# Patient Record
Sex: Female | Born: 1940 | Race: White | Hispanic: No | Marital: Married | Smoking: Former smoker
Health system: Southern US, Community
[De-identification: ages and names within clinical notes are randomized; demographics above are authoritative.]

## PROBLEM LIST (undated history)

## (undated) DIAGNOSIS — M545 Low back pain, unspecified: Secondary | ICD-10-CM

## (undated) DIAGNOSIS — R739 Hyperglycemia, unspecified: Secondary | ICD-10-CM

## (undated) DIAGNOSIS — G8929 Other chronic pain: Secondary | ICD-10-CM

## (undated) DIAGNOSIS — Z9981 Dependence on supplemental oxygen: Secondary | ICD-10-CM

## (undated) DIAGNOSIS — I4891 Unspecified atrial fibrillation: Secondary | ICD-10-CM

## (undated) DIAGNOSIS — E785 Hyperlipidemia, unspecified: Secondary | ICD-10-CM

## (undated) DIAGNOSIS — I10 Essential (primary) hypertension: Secondary | ICD-10-CM

## (undated) DIAGNOSIS — M1990 Unspecified osteoarthritis, unspecified site: Secondary | ICD-10-CM

## (undated) DIAGNOSIS — I2510 Atherosclerotic heart disease of native coronary artery without angina pectoris: Secondary | ICD-10-CM

## (undated) DIAGNOSIS — Z7409 Other reduced mobility: Secondary | ICD-10-CM

## (undated) DIAGNOSIS — J439 Emphysema, unspecified: Secondary | ICD-10-CM

## (undated) DIAGNOSIS — K219 Gastro-esophageal reflux disease without esophagitis: Secondary | ICD-10-CM

## (undated) DIAGNOSIS — I82409 Acute embolism and thrombosis of unspecified deep veins of unspecified lower extremity: Secondary | ICD-10-CM

## (undated) DIAGNOSIS — G629 Polyneuropathy, unspecified: Secondary | ICD-10-CM

## (undated) DIAGNOSIS — F419 Anxiety disorder, unspecified: Secondary | ICD-10-CM

## (undated) DIAGNOSIS — Z889 Allergy status to unspecified drugs, medicaments and biological substances status: Secondary | ICD-10-CM

## (undated) DIAGNOSIS — I2699 Other pulmonary embolism without acute cor pulmonale: Secondary | ICD-10-CM

## (undated) DIAGNOSIS — K5792 Diverticulitis of intestine, part unspecified, without perforation or abscess without bleeding: Secondary | ICD-10-CM

## (undated) DIAGNOSIS — D72829 Elevated white blood cell count, unspecified: Secondary | ICD-10-CM

## (undated) DIAGNOSIS — E7800 Pure hypercholesterolemia, unspecified: Secondary | ICD-10-CM

## (undated) DIAGNOSIS — E119 Type 2 diabetes mellitus without complications: Secondary | ICD-10-CM

## (undated) DIAGNOSIS — D649 Anemia, unspecified: Secondary | ICD-10-CM

## (undated) HISTORY — DX: Hyperlipidemia, unspecified: E78.5

## (undated) HISTORY — DX: Diverticulitis of intestine, part unspecified, without perforation or abscess without bleeding: K57.92

## (undated) HISTORY — PX: TYMPANOSTOMY TUBE PLACEMENT: SHX32

## (undated) HISTORY — PX: JOINT REPLACEMENT: SHX530

## (undated) HISTORY — DX: Anxiety disorder, unspecified: F41.9

## (undated) HISTORY — DX: Essential (primary) hypertension: I10

## (undated) HISTORY — DX: Anemia, unspecified: D64.9

## (undated) HISTORY — DX: Hyperglycemia, unspecified: R73.9

## (undated) HISTORY — DX: Other pulmonary embolism without acute cor pulmonale: I26.99

## (undated) HISTORY — DX: Gastro-esophageal reflux disease without esophagitis: K21.9

## (undated) HISTORY — PX: TONSILLECTOMY: SUR1361

## (undated) HISTORY — DX: Elevated white blood cell count, unspecified: D72.829

---

## 1990-04-04 LAB — HM MAMMOGRAPHY

## 2000-12-25 ENCOUNTER — Inpatient Hospital Stay (HOSPITAL_COMMUNITY): Admission: EM | Admit: 2000-12-25 | Discharge: 2000-12-27 | Payer: Self-pay | Admitting: Emergency Medicine

## 2000-12-26 ENCOUNTER — Encounter: Payer: Self-pay | Admitting: Internal Medicine

## 2003-06-30 ENCOUNTER — Emergency Department (HOSPITAL_COMMUNITY): Admission: EM | Admit: 2003-06-30 | Discharge: 2003-06-30 | Payer: Self-pay | Admitting: Emergency Medicine

## 2004-11-05 ENCOUNTER — Emergency Department (HOSPITAL_COMMUNITY): Admission: AD | Admit: 2004-11-05 | Discharge: 2004-11-05 | Payer: Self-pay | Admitting: Family Medicine

## 2006-08-08 ENCOUNTER — Encounter
Admission: RE | Admit: 2006-08-08 | Discharge: 2006-11-06 | Payer: Self-pay | Admitting: Physical Medicine and Rehabilitation

## 2006-09-04 ENCOUNTER — Emergency Department (HOSPITAL_COMMUNITY): Admission: EM | Admit: 2006-09-04 | Discharge: 2006-09-05 | Payer: Self-pay | Admitting: Emergency Medicine

## 2007-08-17 ENCOUNTER — Emergency Department (HOSPITAL_COMMUNITY): Admission: EM | Admit: 2007-08-17 | Discharge: 2007-08-17 | Payer: Self-pay | Admitting: Emergency Medicine

## 2007-09-10 ENCOUNTER — Emergency Department (HOSPITAL_COMMUNITY): Admission: EM | Admit: 2007-09-10 | Discharge: 2007-09-10 | Payer: Self-pay | Admitting: Emergency Medicine

## 2007-10-03 ENCOUNTER — Encounter (INDEPENDENT_AMBULATORY_CARE_PROVIDER_SITE_OTHER): Payer: Self-pay | Admitting: *Deleted

## 2007-10-03 ENCOUNTER — Encounter: Payer: Self-pay | Admitting: Family Medicine

## 2007-11-03 ENCOUNTER — Emergency Department (HOSPITAL_COMMUNITY): Admission: EM | Admit: 2007-11-03 | Discharge: 2007-11-03 | Payer: Self-pay | Admitting: Emergency Medicine

## 2008-06-07 ENCOUNTER — Encounter (INDEPENDENT_AMBULATORY_CARE_PROVIDER_SITE_OTHER): Payer: Self-pay | Admitting: *Deleted

## 2008-06-07 ENCOUNTER — Encounter: Payer: Self-pay | Admitting: Family Medicine

## 2008-06-07 LAB — CONVERTED CEMR LAB
ALT: 21 units/L
Albumin: 4.7 g/dL
Alkaline Phosphatase: 88 units/L
CO2, serum: 23 mmol/L
Calcium: 9.8 mg/dL
Cholesterol: 162 mg/dL
Creatinine, Ser: 0.77 mg/dL
Globulin: 2.6 g/dL
Glucose, Bld: 91 mg/dL
HCT: 45.2 %
HDL: 44 mg/dL
MCV: 90 fL
RBC count: 5.04 10*6/uL
Sodium, serum: 143 mmol/L
Total Bilirubin: 0.3 mg/dL
Total Protein: 73 g/dL

## 2008-07-02 ENCOUNTER — Encounter (INDEPENDENT_AMBULATORY_CARE_PROVIDER_SITE_OTHER): Payer: Self-pay | Admitting: *Deleted

## 2008-07-30 ENCOUNTER — Ambulatory Visit: Payer: Self-pay | Admitting: Family Medicine

## 2008-07-30 DIAGNOSIS — E1169 Type 2 diabetes mellitus with other specified complication: Secondary | ICD-10-CM

## 2008-07-30 DIAGNOSIS — F329 Major depressive disorder, single episode, unspecified: Secondary | ICD-10-CM

## 2008-07-30 DIAGNOSIS — F419 Anxiety disorder, unspecified: Secondary | ICD-10-CM | POA: Insufficient documentation

## 2008-07-30 DIAGNOSIS — I10 Essential (primary) hypertension: Secondary | ICD-10-CM | POA: Insufficient documentation

## 2008-07-30 DIAGNOSIS — E785 Hyperlipidemia, unspecified: Secondary | ICD-10-CM

## 2008-08-02 ENCOUNTER — Telehealth (INDEPENDENT_AMBULATORY_CARE_PROVIDER_SITE_OTHER): Payer: Self-pay | Admitting: *Deleted

## 2008-09-01 ENCOUNTER — Encounter (INDEPENDENT_AMBULATORY_CARE_PROVIDER_SITE_OTHER): Payer: Self-pay | Admitting: *Deleted

## 2008-09-06 ENCOUNTER — Ambulatory Visit: Payer: Self-pay | Admitting: Gastroenterology

## 2008-09-06 DIAGNOSIS — K5732 Diverticulitis of large intestine without perforation or abscess without bleeding: Secondary | ICD-10-CM

## 2008-09-07 ENCOUNTER — Ambulatory Visit: Payer: Self-pay | Admitting: Gastroenterology

## 2008-09-07 LAB — HM COLONOSCOPY

## 2008-11-01 ENCOUNTER — Ambulatory Visit: Payer: Self-pay | Admitting: Family Medicine

## 2008-11-01 DIAGNOSIS — J019 Acute sinusitis, unspecified: Secondary | ICD-10-CM

## 2008-11-04 ENCOUNTER — Telehealth (INDEPENDENT_AMBULATORY_CARE_PROVIDER_SITE_OTHER): Payer: Self-pay | Admitting: *Deleted

## 2008-11-04 LAB — CONVERTED CEMR LAB
Bilirubin, Direct: 0.1 mg/dL (ref 0.0–0.3)
CO2: 29 meq/L (ref 19–32)
Chloride: 106 meq/L (ref 96–112)
Cholesterol: 169 mg/dL (ref 0–200)
Creatinine, Ser: 0.8 mg/dL (ref 0.4–1.2)
GFR calc non Af Amer: 76 mL/min
HDL: 44.1 mg/dL (ref >39.0)
Potassium: 4.1 meq/L (ref 3.5–5.1)
Sodium: 144 meq/L (ref 135–145)
Total Bilirubin: 0.5 mg/dL (ref 0.3–1.2)
Total Protein: 6.9 g/dL (ref 6.0–8.3)

## 2009-01-14 ENCOUNTER — Ambulatory Visit: Payer: Self-pay | Admitting: Family Medicine

## 2009-01-14 DIAGNOSIS — R011 Cardiac murmur, unspecified: Secondary | ICD-10-CM

## 2009-01-21 ENCOUNTER — Telehealth: Payer: Self-pay | Admitting: Family Medicine

## 2009-02-04 ENCOUNTER — Ambulatory Visit: Payer: Self-pay | Admitting: Cardiovascular Disease

## 2009-02-11 ENCOUNTER — Ambulatory Visit: Payer: Self-pay

## 2009-02-11 ENCOUNTER — Encounter: Payer: Self-pay | Admitting: Cardiovascular Disease

## 2009-03-22 ENCOUNTER — Ambulatory Visit: Payer: Self-pay | Admitting: Internal Medicine

## 2009-03-23 ENCOUNTER — Telehealth (INDEPENDENT_AMBULATORY_CARE_PROVIDER_SITE_OTHER): Payer: Self-pay | Admitting: *Deleted

## 2009-03-28 ENCOUNTER — Ambulatory Visit: Payer: Self-pay | Admitting: Family Medicine

## 2009-03-28 DIAGNOSIS — H6980 Other specified disorders of Eustachian tube, unspecified ear: Secondary | ICD-10-CM

## 2009-04-01 ENCOUNTER — Telehealth (INDEPENDENT_AMBULATORY_CARE_PROVIDER_SITE_OTHER): Payer: Self-pay | Admitting: *Deleted

## 2009-04-04 ENCOUNTER — Ambulatory Visit: Payer: Self-pay | Admitting: Family Medicine

## 2009-05-05 ENCOUNTER — Ambulatory Visit: Payer: Self-pay

## 2009-05-10 ENCOUNTER — Ambulatory Visit: Payer: Self-pay | Admitting: Otolaryngology

## 2009-06-06 ENCOUNTER — Other Ambulatory Visit: Admission: RE | Admit: 2009-06-06 | Discharge: 2009-06-06 | Payer: Self-pay | Admitting: Family Medicine

## 2009-06-06 ENCOUNTER — Ambulatory Visit: Payer: Self-pay | Admitting: Family Medicine

## 2009-06-06 ENCOUNTER — Encounter: Payer: Self-pay | Admitting: Family Medicine

## 2009-06-06 DIAGNOSIS — N760 Acute vaginitis: Secondary | ICD-10-CM | POA: Insufficient documentation

## 2009-06-07 ENCOUNTER — Telehealth (INDEPENDENT_AMBULATORY_CARE_PROVIDER_SITE_OTHER): Payer: Self-pay | Admitting: *Deleted

## 2009-06-07 LAB — CONVERTED CEMR LAB
Albumin: 4.2 g/dL (ref 3.5–5.2)
Alkaline Phosphatase: 76 units/L (ref 39–117)
BUN: 19 mg/dL (ref 6–23)
CO2: 30 meq/L (ref 19–32)
Creatinine, Ser: 0.8 mg/dL (ref 0.4–1.2)
Direct LDL: 75 mg/dL
Eosinophils Absolute: 0.3 10*3/uL (ref 0.0–0.7)
Eosinophils Relative: 4 % (ref 0.0–5.0)
Glucose, Bld: 117 mg/dL — ABNORMAL HIGH (ref 70–99)
HCT: 46.5 % — ABNORMAL HIGH (ref 36.0–46.0)
HDL: 36.2 mg/dL — ABNORMAL LOW (ref >39.00)
Monocytes Absolute: 0.4 10*3/uL (ref 0.1–1.0)
Monocytes Relative: 5.4 % (ref 3.0–12.0)
Sodium: 144 meq/L (ref 135–145)
Total Bilirubin: 0.4 mg/dL (ref 0.3–1.2)
Total Protein: 7.6 g/dL (ref 6.0–8.3)
WBC: 6.9 10*3/uL (ref 4.5–10.5)

## 2009-06-08 ENCOUNTER — Encounter (INDEPENDENT_AMBULATORY_CARE_PROVIDER_SITE_OTHER): Payer: Self-pay | Admitting: *Deleted

## 2009-06-13 ENCOUNTER — Ambulatory Visit: Payer: Self-pay | Admitting: Family Medicine

## 2009-06-13 DIAGNOSIS — N390 Urinary tract infection, site not specified: Secondary | ICD-10-CM | POA: Insufficient documentation

## 2009-06-13 LAB — CONVERTED CEMR LAB
Bilirubin Urine: NEGATIVE
Glucose, Urine, Semiquant: NEGATIVE
Protein, U semiquant: NEGATIVE
Urobilinogen, UA: 0.2
pH: 6.5

## 2009-06-16 ENCOUNTER — Telehealth (INDEPENDENT_AMBULATORY_CARE_PROVIDER_SITE_OTHER): Payer: Self-pay | Admitting: *Deleted

## 2009-07-05 ENCOUNTER — Telehealth (INDEPENDENT_AMBULATORY_CARE_PROVIDER_SITE_OTHER): Payer: Self-pay | Admitting: *Deleted

## 2009-08-21 ENCOUNTER — Emergency Department (HOSPITAL_COMMUNITY): Admission: EM | Admit: 2009-08-21 | Discharge: 2009-08-21 | Payer: Self-pay | Admitting: Emergency Medicine

## 2009-08-31 ENCOUNTER — Ambulatory Visit: Payer: Self-pay | Admitting: Internal Medicine

## 2009-08-31 DIAGNOSIS — F17200 Nicotine dependence, unspecified, uncomplicated: Secondary | ICD-10-CM | POA: Insufficient documentation

## 2009-08-31 LAB — CONVERTED CEMR LAB
ALT: 32 units/L (ref 0–35)
AST: 27 units/L (ref 0–37)
HDL: 52.6 mg/dL (ref >39.00)
Total Bilirubin: 0.7 mg/dL (ref 0.3–1.2)
Total Protein: 7.6 g/dL (ref 6.0–8.3)
VLDL: 27.8 mg/dL (ref 0.0–40.0)

## 2009-09-01 ENCOUNTER — Encounter (INDEPENDENT_AMBULATORY_CARE_PROVIDER_SITE_OTHER): Payer: Self-pay | Admitting: *Deleted

## 2009-10-31 ENCOUNTER — Telehealth (INDEPENDENT_AMBULATORY_CARE_PROVIDER_SITE_OTHER): Payer: Self-pay | Admitting: *Deleted

## 2009-11-07 ENCOUNTER — Telehealth: Payer: Self-pay | Admitting: Family

## 2009-11-07 ENCOUNTER — Ambulatory Visit: Payer: Self-pay | Admitting: Family

## 2009-11-07 DIAGNOSIS — E1140 Type 2 diabetes mellitus with diabetic neuropathy, unspecified: Secondary | ICD-10-CM

## 2009-11-07 LAB — CONVERTED CEMR LAB
Bilirubin Urine: NEGATIVE
Glucose, Urine, Semiquant: NEGATIVE
Hgb A1c MFr Bld: 6.7 % — ABNORMAL HIGH (ref 4.6–6.5)
Specific Gravity, Urine: 1.01
pH: 6

## 2009-11-08 ENCOUNTER — Ambulatory Visit: Payer: Self-pay | Admitting: Family

## 2009-11-08 DIAGNOSIS — M549 Dorsalgia, unspecified: Secondary | ICD-10-CM | POA: Insufficient documentation

## 2009-11-14 ENCOUNTER — Telehealth (INDEPENDENT_AMBULATORY_CARE_PROVIDER_SITE_OTHER): Payer: Self-pay | Admitting: *Deleted

## 2009-11-15 ENCOUNTER — Encounter: Payer: Self-pay | Admitting: Family Medicine

## 2009-11-15 ENCOUNTER — Ambulatory Visit: Payer: Self-pay | Admitting: Family

## 2009-11-15 LAB — CONVERTED CEMR LAB
Nitrite: NEGATIVE
Specific Gravity, Urine: <1.005
pH: 6.5

## 2009-11-18 ENCOUNTER — Encounter: Payer: Self-pay | Admitting: Family Medicine

## 2009-11-21 ENCOUNTER — Encounter (INDEPENDENT_AMBULATORY_CARE_PROVIDER_SITE_OTHER): Payer: Self-pay | Admitting: *Deleted

## 2009-12-05 ENCOUNTER — Ambulatory Visit: Payer: Self-pay | Admitting: Family Medicine

## 2009-12-05 ENCOUNTER — Encounter: Admission: RE | Admit: 2009-12-05 | Discharge: 2009-12-05 | Payer: Self-pay | Admitting: Family Medicine

## 2009-12-05 DIAGNOSIS — N952 Postmenopausal atrophic vaginitis: Secondary | ICD-10-CM

## 2009-12-06 LAB — CONVERTED CEMR LAB
AST: 39 units/L — ABNORMAL HIGH (ref 0–37)
CO2: 31 meq/L (ref 19–32)
Glucose, Bld: 80 mg/dL (ref 70–99)
HDL: 59.3 mg/dL (ref >39.00)
Sodium: 142 meq/L (ref 135–145)
Triglycerides: 129 mg/dL (ref 0.0–149.0)
VLDL: 25.8 mg/dL (ref 0.0–40.0)

## 2009-12-07 ENCOUNTER — Telehealth (INDEPENDENT_AMBULATORY_CARE_PROVIDER_SITE_OTHER): Payer: Self-pay | Admitting: *Deleted

## 2009-12-20 ENCOUNTER — Ambulatory Visit: Payer: Self-pay | Admitting: Family Medicine

## 2009-12-20 LAB — CONVERTED CEMR LAB
Specific Gravity, Urine: 1.02
Urobilinogen, UA: 0.2
pH: 6

## 2009-12-26 ENCOUNTER — Inpatient Hospital Stay (HOSPITAL_COMMUNITY): Admission: EM | Admit: 2009-12-26 | Discharge: 2010-01-02 | Payer: Self-pay | Admitting: Emergency Medicine

## 2009-12-26 ENCOUNTER — Ambulatory Visit: Payer: Self-pay | Admitting: Cardiology

## 2009-12-26 ENCOUNTER — Ambulatory Visit: Payer: Self-pay | Admitting: Pulmonary Disease

## 2009-12-27 ENCOUNTER — Encounter: Payer: Self-pay | Admitting: Pulmonary Disease

## 2009-12-28 ENCOUNTER — Encounter: Payer: Self-pay | Admitting: Pulmonary Disease

## 2009-12-28 ENCOUNTER — Ambulatory Visit: Payer: Self-pay | Admitting: Vascular Surgery

## 2009-12-28 ENCOUNTER — Ambulatory Visit: Payer: Self-pay | Admitting: Infectious Disease

## 2009-12-30 ENCOUNTER — Ambulatory Visit: Payer: Self-pay | Admitting: Gastroenterology

## 2010-01-02 DIAGNOSIS — D649 Anemia, unspecified: Secondary | ICD-10-CM

## 2010-01-02 DIAGNOSIS — D759 Disease of blood and blood-forming organs, unspecified: Secondary | ICD-10-CM | POA: Insufficient documentation

## 2010-01-02 DIAGNOSIS — K219 Gastro-esophageal reflux disease without esophagitis: Secondary | ICD-10-CM | POA: Insufficient documentation

## 2010-01-02 DIAGNOSIS — J9600 Acute respiratory failure, unspecified whether with hypoxia or hypercapnia: Secondary | ICD-10-CM

## 2010-01-02 DIAGNOSIS — B370 Candidal stomatitis: Secondary | ICD-10-CM

## 2010-01-02 DIAGNOSIS — D72829 Elevated white blood cell count, unspecified: Secondary | ICD-10-CM | POA: Insufficient documentation

## 2010-01-02 DIAGNOSIS — A419 Sepsis, unspecified organism: Secondary | ICD-10-CM | POA: Insufficient documentation

## 2010-01-03 ENCOUNTER — Telehealth: Payer: Self-pay | Admitting: Infectious Disease

## 2010-01-03 ENCOUNTER — Telehealth (INDEPENDENT_AMBULATORY_CARE_PROVIDER_SITE_OTHER): Payer: Self-pay | Admitting: *Deleted

## 2010-01-03 ENCOUNTER — Encounter: Payer: Self-pay | Admitting: Family Medicine

## 2010-01-06 ENCOUNTER — Encounter (INDEPENDENT_AMBULATORY_CARE_PROVIDER_SITE_OTHER): Payer: Self-pay | Admitting: *Deleted

## 2010-01-09 ENCOUNTER — Ambulatory Visit: Payer: Self-pay | Admitting: Family Medicine

## 2010-01-09 DIAGNOSIS — I509 Heart failure, unspecified: Secondary | ICD-10-CM | POA: Insufficient documentation

## 2010-01-11 LAB — CONVERTED CEMR LAB
BUN: 21 mg/dL (ref 6–23)
CO2: 35 meq/L — ABNORMAL HIGH (ref 19–32)
Calcium: 10.4 mg/dL (ref 8.4–10.5)
Creatinine, Ser: 0.8 mg/dL (ref 0.4–1.2)
Lymphocytes Relative: 38.4 % (ref 12.0–46.0)
Lymphs Abs: 2 10*3/uL (ref 0.7–4.0)
MCHC: 33 g/dL (ref 30.0–36.0)
MCV: 88.1 fL (ref 78.0–100.0)
Monocytes Relative: 13.7 % — ABNORMAL HIGH (ref 3.0–12.0)
Platelets: 436 10*3/uL — ABNORMAL HIGH (ref 150.0–400.0)
Potassium: 4.9 meq/L (ref 3.5–5.1)
RDW: 16.2 % — ABNORMAL HIGH (ref 11.5–14.6)
Sodium: 144 meq/L (ref 135–145)
WBC: 5.1 10*3/uL (ref 4.5–10.5)

## 2010-01-12 ENCOUNTER — Telehealth (INDEPENDENT_AMBULATORY_CARE_PROVIDER_SITE_OTHER): Payer: Self-pay | Admitting: *Deleted

## 2010-01-12 ENCOUNTER — Encounter: Payer: Self-pay | Admitting: Infectious Disease

## 2010-01-16 ENCOUNTER — Encounter: Payer: Self-pay | Admitting: Infectious Disease

## 2010-01-16 ENCOUNTER — Telehealth: Payer: Self-pay | Admitting: Infectious Disease

## 2010-01-19 ENCOUNTER — Telehealth: Payer: Self-pay | Admitting: Infectious Disease

## 2010-01-19 ENCOUNTER — Encounter: Payer: Self-pay | Admitting: Infectious Disease

## 2010-01-20 ENCOUNTER — Ambulatory Visit (HOSPITAL_COMMUNITY): Admission: RE | Admit: 2010-01-20 | Discharge: 2010-01-20 | Payer: Self-pay | Admitting: Infectious Disease

## 2010-01-23 ENCOUNTER — Ambulatory Visit: Payer: Self-pay | Admitting: Infectious Disease

## 2010-01-23 DIAGNOSIS — I81 Portal vein thrombosis: Secondary | ICD-10-CM | POA: Insufficient documentation

## 2010-01-31 ENCOUNTER — Ambulatory Visit: Payer: Self-pay | Admitting: Cardiology

## 2010-01-31 ENCOUNTER — Ambulatory Visit (HOSPITAL_COMMUNITY): Admission: RE | Admit: 2010-01-31 | Discharge: 2010-01-31 | Payer: Self-pay | Admitting: Family Medicine

## 2010-01-31 ENCOUNTER — Ambulatory Visit: Payer: Self-pay

## 2010-01-31 ENCOUNTER — Encounter: Payer: Self-pay | Admitting: Family Medicine

## 2010-01-31 ENCOUNTER — Ambulatory Visit: Payer: Self-pay | Admitting: Cardiovascular Disease

## 2010-02-01 ENCOUNTER — Telehealth (INDEPENDENT_AMBULATORY_CARE_PROVIDER_SITE_OTHER): Payer: Self-pay

## 2010-02-01 ENCOUNTER — Ambulatory Visit: Payer: Self-pay | Admitting: Family Medicine

## 2010-02-02 ENCOUNTER — Encounter: Payer: Self-pay | Admitting: Internal Medicine

## 2010-02-02 ENCOUNTER — Encounter (HOSPITAL_COMMUNITY): Admission: RE | Admit: 2010-02-02 | Discharge: 2010-04-06 | Payer: Self-pay | Admitting: Cardiovascular Disease

## 2010-02-02 ENCOUNTER — Ambulatory Visit: Payer: Self-pay | Admitting: Internal Medicine

## 2010-02-02 ENCOUNTER — Ambulatory Visit: Payer: Self-pay

## 2010-02-08 ENCOUNTER — Telehealth: Payer: Self-pay | Admitting: Cardiovascular Disease

## 2010-02-20 ENCOUNTER — Ambulatory Visit: Payer: Self-pay | Admitting: Family Medicine

## 2010-02-20 DIAGNOSIS — R609 Edema, unspecified: Secondary | ICD-10-CM

## 2010-02-20 LAB — CONVERTED CEMR LAB
Bilirubin Urine: NEGATIVE
Blood in Urine, dipstick: NEGATIVE

## 2010-02-22 ENCOUNTER — Telehealth (INDEPENDENT_AMBULATORY_CARE_PROVIDER_SITE_OTHER): Payer: Self-pay | Admitting: *Deleted

## 2010-02-22 LAB — CONVERTED CEMR LAB
CO2: 32 meq/L (ref 19–32)
Calcium: 10.2 mg/dL (ref 8.4–10.5)
GFR calc non Af Amer: 77.63 mL/min (ref >60)
Hgb A1c MFr Bld: 5.8 % (ref 4.6–6.5)
Sodium: 143 meq/L (ref 135–145)

## 2010-02-24 ENCOUNTER — Ambulatory Visit: Payer: Self-pay | Admitting: Family Medicine

## 2010-02-24 DIAGNOSIS — M722 Plantar fascial fibromatosis: Secondary | ICD-10-CM

## 2010-02-27 ENCOUNTER — Telehealth (INDEPENDENT_AMBULATORY_CARE_PROVIDER_SITE_OTHER): Payer: Self-pay | Admitting: *Deleted

## 2010-02-28 DIAGNOSIS — M25559 Pain in unspecified hip: Secondary | ICD-10-CM

## 2010-03-02 ENCOUNTER — Telehealth (INDEPENDENT_AMBULATORY_CARE_PROVIDER_SITE_OTHER): Payer: Self-pay | Admitting: *Deleted

## 2010-03-28 ENCOUNTER — Ambulatory Visit: Payer: Self-pay | Admitting: Family Medicine

## 2010-03-28 LAB — CONVERTED CEMR LAB
Bilirubin Urine: NEGATIVE
Glucose, Urine, Semiquant: NEGATIVE
Specific Gravity, Urine: 1.015
pH: 6

## 2010-04-03 ENCOUNTER — Ambulatory Visit: Payer: Self-pay | Admitting: Family Medicine

## 2010-04-03 LAB — CONVERTED CEMR LAB
Bilirubin Urine: NEGATIVE
Glucose, Urine, Semiquant: NEGATIVE
Ketones, urine, test strip: NEGATIVE
Specific Gravity, Urine: <1.005

## 2010-04-04 ENCOUNTER — Encounter: Payer: Self-pay | Admitting: Family Medicine

## 2010-04-06 ENCOUNTER — Telehealth (INDEPENDENT_AMBULATORY_CARE_PROVIDER_SITE_OTHER): Payer: Self-pay | Admitting: *Deleted

## 2010-05-02 ENCOUNTER — Telehealth (INDEPENDENT_AMBULATORY_CARE_PROVIDER_SITE_OTHER): Payer: Self-pay | Admitting: *Deleted

## 2010-05-05 ENCOUNTER — Ambulatory Visit: Payer: Self-pay | Admitting: Family Medicine

## 2010-05-05 DIAGNOSIS — E669 Obesity, unspecified: Secondary | ICD-10-CM

## 2010-05-05 LAB — CONVERTED CEMR LAB
Bilirubin Urine: NEGATIVE
Glucose, Urine, Semiquant: NEGATIVE
Specific Gravity, Urine: 1.015
WBC Urine, dipstick: NEGATIVE
pH: 6

## 2010-06-07 ENCOUNTER — Ambulatory Visit: Payer: Self-pay | Admitting: Family Medicine

## 2010-06-07 LAB — CONVERTED CEMR LAB
Bilirubin Urine: NEGATIVE
Ketones, urine, test strip: NEGATIVE
Nitrite: NEGATIVE
Specific Gravity, Urine: 1.015
Urobilinogen, UA: 0.2
pH: 6

## 2010-06-07 LAB — HM DIABETES FOOT EXAM: HM Diabetic Foot Exam: NORMAL

## 2010-06-08 ENCOUNTER — Encounter: Payer: Self-pay | Admitting: Family Medicine

## 2010-06-09 ENCOUNTER — Telehealth (INDEPENDENT_AMBULATORY_CARE_PROVIDER_SITE_OTHER): Payer: Self-pay | Admitting: *Deleted

## 2010-06-14 ENCOUNTER — Telehealth (INDEPENDENT_AMBULATORY_CARE_PROVIDER_SITE_OTHER): Payer: Self-pay | Admitting: *Deleted

## 2010-06-14 LAB — CONVERTED CEMR LAB
ALT: 27 units/L (ref 0–35)
AST: 27 units/L (ref 0–37)
Alkaline Phosphatase: 51 units/L (ref 39–117)
Basophils Absolute: 0 10*3/uL (ref 0.0–0.1)
CO2: 30 meq/L (ref 19–32)
Calcium: 9.8 mg/dL (ref 8.4–10.5)
Creatinine, Ser: 1 mg/dL (ref 0.4–1.2)
Eosinophils Absolute: 0.1 10*3/uL (ref 0.0–0.7)
GFR calc non Af Amer: 58.23 mL/min (ref >60)
Glucose, Bld: 86 mg/dL (ref 70–99)
HDL: 61.8 mg/dL (ref >39.00)
Hgb A1c MFr Bld: 7.4 % — ABNORMAL HIGH (ref 4.6–6.5)
Lymphocytes Relative: 28.6 % (ref 12.0–46.0)
MCHC: 33.2 g/dL (ref 30.0–36.0)
Monocytes Relative: 7.5 % (ref 3.0–12.0)
Neutro Abs: 6.3 10*3/uL (ref 1.4–7.7)
Neutrophils Relative %: 62.6 % (ref 43.0–77.0)
Platelets: 281 10*3/uL (ref 150.0–400.0)
RDW: 16.5 % — ABNORMAL HIGH (ref 11.5–14.6)
Sodium: 143 meq/L (ref 135–145)
TSH: 2.88 microintl units/mL (ref 0.35–5.50)
Total Bilirubin: 0.5 mg/dL (ref 0.3–1.2)
Total CHOL/HDL Ratio: 2
Triglycerides: 61 mg/dL (ref 0.0–149.0)

## 2010-06-15 ENCOUNTER — Ambulatory Visit: Payer: Self-pay | Admitting: Family Medicine

## 2010-06-17 ENCOUNTER — Encounter: Payer: Self-pay | Admitting: Family Medicine

## 2010-07-12 ENCOUNTER — Ambulatory Visit: Payer: Self-pay | Admitting: Family Medicine

## 2010-07-13 ENCOUNTER — Encounter: Payer: Self-pay | Admitting: Family Medicine

## 2010-07-13 LAB — CONVERTED CEMR LAB
Candida species: NEGATIVE
Gardnerella vaginalis: NEGATIVE

## 2010-07-24 ENCOUNTER — Telehealth (INDEPENDENT_AMBULATORY_CARE_PROVIDER_SITE_OTHER): Payer: Self-pay | Admitting: *Deleted

## 2010-07-26 ENCOUNTER — Ambulatory Visit: Payer: Self-pay | Admitting: Gynecology

## 2010-07-26 ENCOUNTER — Encounter: Payer: Self-pay | Admitting: Family Medicine

## 2010-08-01 ENCOUNTER — Ambulatory Visit: Payer: Self-pay | Admitting: Cardiovascular Disease

## 2010-08-01 DIAGNOSIS — I428 Other cardiomyopathies: Secondary | ICD-10-CM

## 2010-08-07 ENCOUNTER — Ambulatory Visit: Payer: Self-pay | Admitting: Gynecology

## 2010-08-22 ENCOUNTER — Telehealth (INDEPENDENT_AMBULATORY_CARE_PROVIDER_SITE_OTHER): Payer: Self-pay | Admitting: *Deleted

## 2010-09-11 ENCOUNTER — Ambulatory Visit
Admission: RE | Admit: 2010-09-11 | Discharge: 2010-09-11 | Payer: Self-pay | Source: Home / Self Care | Attending: Family Medicine | Admitting: Family Medicine

## 2010-09-11 ENCOUNTER — Other Ambulatory Visit: Payer: Self-pay | Admitting: Family Medicine

## 2010-09-11 LAB — HEPATIC FUNCTION PANEL
ALT: 20 U/L (ref 0–35)
AST: 27 U/L (ref 0–37)
Albumin: 4.1 g/dL (ref 3.5–5.2)
Alkaline Phosphatase: 50 U/L (ref 39–117)
Bilirubin, Direct: 0.1 mg/dL (ref 0.0–0.3)
Total Bilirubin: 0.3 mg/dL (ref 0.3–1.2)
Total Protein: 6.7 g/dL (ref 6.0–8.3)

## 2010-09-11 LAB — HEMOGLOBIN A1C: Hgb A1c MFr Bld: 6.8 % — ABNORMAL HIGH (ref 4.6–6.5)

## 2010-10-02 ENCOUNTER — Ambulatory Visit
Admission: RE | Admit: 2010-10-02 | Discharge: 2010-10-02 | Payer: Self-pay | Source: Home / Self Care | Attending: Family Medicine | Admitting: Family Medicine

## 2010-10-02 DIAGNOSIS — J309 Allergic rhinitis, unspecified: Secondary | ICD-10-CM | POA: Insufficient documentation

## 2010-10-03 NOTE — Progress Notes (Signed)
Summary: yeast infection no better  Phone Note Call from Patient Call back at Home Phone [phone]   Summary of Call: Pt seen on 07-13-10 for yeast infection and rx DIFLUCAN 100 MG TABS (FLUCONAZOLE) 1 tab daily x7 days. Pt still c/o yeast infection and requesting refill on med. pls advise...........Marland KitchenFelecia Deloach CMA  July 24, 2010 12:31 PM   Follow-up for Phone Call        if pt's sxs remain external she needs to use the miconazole cream two times a day.  at this point we have been tx'ing this off and on for 6 months, will need gyn or derm referral Follow-up by: Neena Rhymes MD,  July 24, 2010 12:44 PM  Additional Follow-up for Phone Call Additional follow up Details #1::        pt aware referral put in ............Marland KitchenFelecia Deloach CMA  July 24, 2010 4:04 PM

## 2010-10-03 NOTE — Assessment & Plan Note (Signed)
Summary: POST HOSP F/U/CDJ   Vital Signs:  Patient profile:   70 year old female O2 Sat:      98 % on 2 L/min Pulse rate:   102 / minute BP sitting:   110 / 80  (left arm)  Vitals Entered By: Doristine Devoid (Jan 09, 2010 3:07 PM)  O2 Flow:  2 L/min  Serial Vital Signs/Assessments:                                PEF    PreRx  PostRx Time      O2 Sat  O2 Type     L/min  L/min  L/min   By 3:49 PM   92  %   Room air                          Chemira Jones   History of Present Illness: 70 yo woman here today for hospital f/u.  was admitted 4/25 w/ septic shock and found to have ruptured liver abscesses. is on IV abx until June- following w/ ID.  reports she feels better today than previously but is 'exhausted'- did PT exercises, had company, and is now here.  according to D/C summary issues to f/u are  1) Anemia- needs CBC  2) HTN- holding BP meds at this time due to hypotension  3) Home O2- pt would like to stop O2 while at rest if possible, c/o nasal irritation  4) Heart Failure- needs repeat ECHO in 2-3 weeks to assess heart fxn.  needs f/u BMP since started lasix  5) Depression- pt having a hard time emotionally dealing w/ all that she went through.  reports she felt 'the best i had in a long while' prior to the infxn.  now struggling w/ debilitation, need for O2, inability to walk.  Current Medications (verified): 1)  Nexium 40 Mg Cpdr (Esomeprazole Magnesium) .... Once Daily As Needed 2)  Alprazolam 1 Mg Tabs (Alprazolam) .Marland Kitchen.. 1 By Mouth Qam; 2 By Mouth Qpm 3)  Tramadol Hcl 50 Mg Tabs (Tramadol Hcl) .... As Needed 4)  Simvastatin 40 Mg Tabs (Simvastatin) .Marland Kitchen.. 1 By Mouth Qhs 5)  Mvi W/fe 6)  Folic Acid 1 Mg Tabs (Folic Acid) .... Take One Tablet Daily 7)  Fenofibrate 160 Mg Tabs (Fenofibrate) .... Take One Tablet Daily 8)  Onetouch Ultra Test  Strp (Glucose Blood) .... Test 1-2 Times Daily 9)  Onetouch Lancets  Misc (Lancets) .... Test 1-2 Times Daily 10)  Vicodin 5-500 Mg  Tabs (Hydrocodone-Acetaminophen) .Marland Kitchen.. 1 Tab By Mouth Q4-6 As Needed For Severe Pain 11)  Invanz 1 Gm Solr (Ertapenem Sodium) .... Once Daily X 3 Weeks 12)  Furosemide 20 Mg Tabs (Furosemide) .... Take 1 Tablet By Mouth Once A Day Per Hospital Md 13)  Proair Hfa 108 [phone] Base) Mcg/act Aers (Albuterol Sulfate) .... 2 Puffs Every 4 Hours As Needed 14)  Oxygen .... 2l At Rest/sleep and 3l W/ Activity  Allergies (verified): 1)  ! Pcn 2)  ! Amoxicillin (Amoxicillin) 3)  ! Celexa 4)  ! Zonegran (Zonisamide) 5)  ! 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  The patient denies fever, chest pain, syncope, peripheral edema, and abdominal pain.    Physical Exam  General:  well appearing, sitting in wheel chair, O2 in place Lungs:  Normal respiratory effort, chest expands symmetrically. Lungs are clear to auscultation, no crackles or wheezes. Heart:  Normal rate and regular rhythm. S1 and S2 normal without gallop, murmur, click, rub or other extra sounds. Abdomen:  soft, NT/ND, +BS Pulses:  +2 carotid, radial, DP Extremities:  No clubbing, cyanosis, edema, or deformity noted Psych:  somewhat withdrawn, intermittantly tearful   Impression & Recommendations:  Problem # 1:  SEPSIS (ICD-995.91) Assessment Improved pt following w/ ID, has abx via PICC until June  Problem # 2:  CANDIDIASIS OF MOUTH (ICD-112.0) Assessment: Unchanged pt finishing up diflucan.  abx will persist past course of diflucan.  may need extension.  given interaction of anti-fungals and statins will have pt hold statin until diflucan complete.  Pt expresses understanding and is in agreement w/ this plan.  Problem # 3:  ANEMIA (ICD-285.9) check  CBC to trend. Her updated medication list for this problem includes:    Folic Acid 1 Mg Tabs (Folic acid) .Marland Kitchen... Take one tablet daily  Orders: Venipuncture (16109) TLB-CBC Platelet - w/Differential (85025-CBCD)  Problem # 4:  LEUKOCYTOSIS (ICD-288.60) Assessment: New check CBC to trend, should be normalizing at this point  Problem # 5:  ABSCESS OF LIVER (ICD-572.0) cause of septic shock.  on abx via PICC per ID.  will follow along  Problem # 6:  HEART FAILURE, CONGESTIVE UNSPEC (ICD-428.0) Assessment: Unchanged needs to have repeat ECHO to assess heart fxn.  check BMP in setting of lasix. Her updated medication list for this problem includes:    Furosemide 20 Mg Tabs (Furosemide) .Marland Kitchen... Take 1 tablet by mouth once a day per hospital md  Orders: Cardiology Referral (Cardiology) TLB-BMP (Basic Metabolic Panel-BMET) (80048-METABOL)  Complete Medication List: 1)  Nexium 40 Mg Cpdr (Esomeprazole magnesium) .... Once daily as needed 2)  Alprazolam 1 Mg Tabs (Alprazolam) .Marland Kitchen.. 1 by mouth qam; 2 by mouth qpm 3)  Tramadol Hcl 50 Mg Tabs (Tramadol hcl) .... As needed 4)  Simvastatin 40 Mg Tabs (Simvastatin) .Marland Kitchen.. 1 by mouth qhs 5)  Mvi W/fe  6)  Folic Acid 1 Mg Tabs (Folic acid) .... Take one tablet daily 7)  Fenofibrate 160 Mg Tabs (Fenofibrate) .... Take one tablet daily 8)  Onetouch Ultra Test Strp (Glucose blood) .... Test 1-2 times daily 9)  Onetouch Lancets Misc (Lancets) .... Test 1-2 times daily 10)  Vicodin 5-500 Mg Tabs (Hydrocodone-acetaminophen) .Marland Kitchen.. 1 tab by mouth q4-6 as needed for severe pain 11)  Invanz 1 Gm Solr (Ertapenem sodium) .... Once daily x 3 weeks 12)  Furosemide 20 Mg Tabs (Furosemide) .... Take 1 tablet by mouth once a day per hospital md 13)  Proair Hfa 108 (90 Base) Mcg/act Aers (Albuterol sulfate) .... 2 puffs every 4 hours as needed 14)  Oxygen  .... 2l at rest/sleep and 3l w/ activity  Patient Instructions: 1)  Please schedule a follow-up appointment in 3  weeks (30 minute appt). 2)  STOP the Simvastatin and Fenofibrate 3)  We will call you with your lab results 4)  Someone will call you with your cardiology appt 5)  I'll be in touch with [facility] about your oxygen- you can remove it while resting but please wear it when you are moving about or stressed 6)  Call with any questions  or concerns 7)  HANG IN THERE!!!  You're already a miracle! 8)

## 2010-10-03 NOTE — Letter (Signed)
Summary: [facility]counter Notice/[facility]  Encounter Notice/[facility]   Imported By: Lanelle Bal 01/10/2010 09:27:34  _____________________________________________________________________  External Attachment:    Type:   Image     Comment:   External Document

## 2010-10-03 NOTE — Procedures (Signed)
Summary: Colonoscopy   Colonoscopy  Procedure date:  09/07/2008  Findings:      Results: Diverticulosis.       Location:  [facility].    Comments:      Repeat colonoscopy in 10 years.    Procedures Next Due Date:    Colonoscopy: 09/2018 [facility] Procedure - STATUS: Crctd  ?                                         Perform Date: 5 Jan10 09:51  Ordered By: DEFAULT MD , PROVIDER         Ordered Date:  Facility: [facility]                               Department: EMR  Service Report Text  Colonoscopy    Procedure date:  09/07/2008    Findings:        [facility]     Procedures Next Due Date:      Colonoscopy: 09/2018    COLONOSCOPY PROCEDURE REPORT    PATIENT:    Sarah Mcguire, Sarah Mcguire  MR#:    [id number]   BIRTHDATE:  10/23/1940  GENDER: female    ENDOSCOPIST:    Barbette Hair. Arlyce Dice, MD   Referred by:   Helane Rima Beverely Low, M.D.     PROCEDURE DATE:  09/07/2008   PROCEDURE:  Average-risk screening colonoscopy   ASA CLASS:  Class II   INDICATIONS:   screening     MEDICATIONS:     Fentanyl 125 mcg IV, Benadryl 50 mg IV, Versed 12 mg IV    DESCRIPTION OF PROCEDURE:   After the risks benefits and alternatives of the procedure were thoroughly explained, informed  consent was obtained.  Digital rectal exam was performed and revealed no abnormalities.   The LB [id number] endoscope was  introduced through the anus and advanced to the cecum, which was identified by both the appendix and ileocecal valve, without  limitations.  The quality of the prep was good, using MiraLax.  The instrument was then slowly withdrawn as the colon was fully  examined.   <<PROCEDUREIMAGES>>                <<OLD IMAGES>>    FINDINGS:  Moderate diverticulosis was found sigmoid to descending  Remainder of colon normal (see image2, image3, image4,  image5, image12, and image13).   Retroflexed views in the rectum revealed no abnormalities.    The scope was then  withdrawn  from  the patient and the procedure completed.    COMPLICATIONS:  None    ENDOSCOPIC IMPRESSION:    1) Moderate diverticulosis in the sigmoid to descending     2) Normal remainder    RECOMMENDATIONS:    1) Should follow current colorectal cancer screening guidelines for routine risk patients with a repeat colonoscopy in 10  years.           REPEAT EXAM:    In 10 year(s) for Colonoscopy.     _______________________________   Barbette Hair. Arlyce Dice, MD    CC:   Helane Rima. Beverely Low, MD      {marker}n.   This report was created from the original endoscopy report, which was reviewed and signed by the above listed endoscopist.       Signed by  Dwan Bolt on 09/07/2008 at 1:17 PM   ________________________________________________________________________  Additional Information  HL7 RESULT STATUS : C  External IF Update Timestamp : 2008-09-07:13:17:00.000000   This report was created from the original endoscopy report, which was reviewed and signed by the above listed endoscopist.

## 2010-10-03 NOTE — Assessment & Plan Note (Signed)
Summary: rov/chf/jml   Visit Type:  Follow-up Primary Provider:  Neena Rhymes, MD   History of Present Illness: Sarah Mcguire is seen at the request of Dr Artist Pais.  She was recently hospitalized for sepsis thought secondary to diverticulitis.  She was quite ill and had ? CHF with volume overload.  She had an echo about a year ago with normal EF and echo 4/11 in hospital suggested EF 45%.  She was thought possibly to have decreased LV from sepsis syndrome.  She has done well since D/c except for her anxiety.  She has been doing rehab and still has a lot of leg weakness.  She denies SSCP, dyspnea or previous CAD.  I reviewed her echo from today and she still has abnormal septal motion with EF improved at 50-55%. Her ECG is normal.  In the setting of persistant LV dysfunction and resolution of sepsis she needs CAD R/O.  I think the easiest way to do this since she is still too weak to walk is a lexisca myovue.  She will need to take her Xanax befor ethe test as she needed one just for her echo.  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) 2)  Abscess of Liver  (ICD-572.0) 3)  Diverticulitis of Colon  (ICD-562.11) 4)  Heart Failure, Congestive Unspec  (ICD-428.0) 5)  Sepsis  (ICD-995.91) 6)  Candidiasis of Mouth  (ICD-112.0) 7)  Gerd  (ICD-530.81) 8)  Thrombocytosis  (ICD-289.9) 9)  Leukocytosis  (ICD-288.60) 10)  Anemia  (ICD-285.9) 11)  Acute Respiratory Failure  (ICD-518.81) 12)  Abscess of Liver  (ICD-572.0) 13)  Vaginitis, Atrophic  (ICD-627.3) 14)  Back Pain, Chronic  (ICD-724.5) 15)  Diabetes Mellitus  (ICD-250.00) 16)  Smoker  (ICD-305.1) 17)  Uti  (ICD-599.0) 18)  Vaginitis  (ICD-616.10) 19)  Preventive Health Care  (ICD-V70.0) 20)  Screening For Malignant Neoplasm of The Cervix  (ICD-V76.2) 21)  Eustachian Tube Dysfunction  (ICD-381.81) 22)  Heart Murmur, Systolic  (ICD-785.2) 23)  Sinusitis- Acute-nos  (ICD-461.9) 24)  Hx of Diverticulitis of Colon   (ICD-562.11) 25)  Depressive Disorder  (ICD-311) 26)  Special Screening For Malignant Neoplasms Colon  (ICD-V76.51) 27)  Hypertension  (ICD-401.9) 28)  Hyperlipidemia  (ICD-272.4)  Current Medications (verified): 1)  Nexium 40 Mg Cpdr (Esomeprazole Magnesium) .... Once Daily As Needed 2)  Alprazolam 1 Mg Tabs (Alprazolam) .Marland Kitchen.. 1 By Mouth Qam; 2 By Mouth Qpm 3)  Tramadol Hcl 50 Mg Tabs (Tramadol Hcl) .... As Needed-- Hold 4)  Mvi W/fe 5)  Folic Acid 1 Mg Tabs (Folic Acid) .... Take One Tablet Daily 6)  Onetouch Ultra Test  Strp (Glucose Blood) .... Test 1-2 Times Daily 7)  Onetouch Lancets  Misc (Lancets) .... Test 1-2 Times Daily 8)  Vicodin 5-500 Mg Tabs (Hydrocodone-Acetaminophen) .Marland Kitchen.. 1 Tab By Mouth Q4-6 As Needed For Severe Pain 9)  Furosemide 20 Mg Tabs (Furosemide) .... Take 1 Tablet By Mouth Once A Day Per Hospital Md 10)  Proair Hfa 108 [phone] Base) Mcg/act Aers (Albuterol Sulfate) .... 2 Puffs Every 4 Hours As Needed 11)  Ciprofloxacin Hcl 500 Mg Tabs (Ciprofloxacin Hcl) .... Take 1 Tablet By Mouth Two Times A Day For 2 Weeks 12)  Metronidazole 500 Mg Tabs (Metronidazole) .... Take 1 Tablet By Mouth Three Times A Day For Two Weeks  Allergies: 1)  ! Pcn 2)  ! Amoxicillin (Amoxicillin) 3)  ! Celexa 4)  ! Zonegran (Zonisamide) 5)  ! Neurontin 6)  ! * Zonismide  Vital Signs:  Patient profile:   70 year old female Height:      63.5 inches Weight:      178 pounds Pulse rate:   93 / minute BP sitting:   139 / 93  (left arm) Cuff size:   large  Vitals Entered By: Burnett Kanaris, CNA (Jan 31, 2010 3:08 PM)  Physical Exam  General:  Affect appropriate Healthy:  appears stated age HEENT: normal Neck supple with no adenopathy JVP normal no bruits no thyromegaly Lungs clear with no wheezing and good diaphragmatic motion Heart:  S1/S2 no murmur,rub, gallop or click PMI normal Abdomen: benighn, BS positve, no tenderness, no AAA no bruit.  No HSM or HJR Distal pulses intact  with no bruits No edema Neuro non-focal Skin warm and dry    Impression & Recommendations:  Problem # 1:  HEART FAILURE, CONGESTIVE UNSPEC (ICD-428.0) F/U myovue to R/O CAD and reassess EF.  Likely need MRI in 6 months.  Need for cath will depend on results of myovue Her updated medication list for this problem includes:    Furosemide 20 Mg Tabs (Furosemide) .Marland Kitchen... Take 1 tablet by mouth once a day per hospital md  Problem # 2:  HYPERTENSION (ICD-401.9) Will add ACE pending results of myovue Her updated medication list for this problem includes:    Furosemide 20 Mg Tabs (Furosemide) .Marland Kitchen... Take 1 tablet by mouth once a day per hospital md  Orders: Nuclear Stress Test (Nuc Stress Test)  Problem # 3:  HYPERLIPIDEMIA (ICD-272.4) At goal continue statin The following medications were removed from the medication list:    Simvastatin 40 Mg Tabs (Simvastatin) .Marland Kitchen... 1 by mouth qhs  CHOL: 157 (12/05/2009)   LDL: 72 (12/05/2009)   HDL: 59.30 (12/05/2009)   TG: 129.0 (12/05/2009)  Patient Instructions: 1)  Your physician recommends that you schedule a follow-up appointment in: 6 months 2)  Your physician has requested that you have an exercise stress myoview.  For further information please visit [email or web address]  Please follow instruction sheet, as given.

## 2010-10-03 NOTE — Letter (Signed)
Summary: [facility]  [facility]   Imported By: Lanelle Bal 12/12/2009 08:14:23  _____________________________________________________________________  External Attachment:    Type:   Image     Comment:   External Document

## 2010-10-03 NOTE — Progress Notes (Signed)
Summary: alprzolam refill   Phone Note Refill Request Message from:  Patient on December 07, 2009 11:50 AM  Refills Requested: Medication #1:  ALPRAZOLAM 1 MG TABS 1 by mouth qam; 2 by mouth qpm Initial call taken by: Doristine Devoid,  December 07, 2009 11:50 AM    Prescriptions: ALPRAZOLAM 1 MG TABS (ALPRAZOLAM) 1 by mouth qam; 2 by mouth qpm  #90 x 0   Entered by:   Doristine Devoid   Authorized by:   Neena Rhymes MD   Signed by:   Doristine Devoid on 12/07/2009   Method used:   Printed then faxed to ...       [facility]  [address] [phone]* (retail)       [address]       [place], [place]  [address]       Ph: [phone]       Fax: [phone]   RxID:   [id number]

## 2010-10-03 NOTE — Progress Notes (Signed)
Summary: Nuc. Pre-Procedure  Phone Note Outgoing Call Call back at [facility] Phone [phone]   Call placed by: Irean Hong, RN,  February 01, 2010 2:49 PM Summary of Call: Reviewed information on Myoview Information Sheet (see scanned document for further details).  Spoke with patient.     Nuclear Med Background Indications for Stress Test: Evaluation for Ischemia  Indications Comments: Discharged 01/02/10 Hepatic abscesses secondary to diverticulitis> septic shock, acute renal and respiratory failure.  History: Echo, Emphysema  History Comments: 01/31/10 Echo: EF=45-50%. Hx. CHF,diastolic dysfunction/LVD.  Symptoms: Fatigue, Fatigue with Exertion    Nuclear Pre-Procedure Cardiac Risk Factors: Family History - CAD, Hypertension, Lipids, NIDDM, Obesity, Smoker Height (in): 63.5

## 2010-10-03 NOTE — Assessment & Plan Note (Signed)
Summary: still has UTI//lch   Vital Signs:  Patient profile:   70 year old female Weight:      184.38 pounds Temp:     97.1 degrees F oral Pulse rate:   76 / minute Pulse rhythm:   regular Resp:     16 per minute BP sitting:   128 / 70  (left arm) Cuff size:   large  Vitals Entered By: Mervin Kung CMA (November 15, 2009 10:34 AM) CC: ROOM 17  Still hjas vaginal discomfort after completing antibiotic   Primary Care Provider:  Neena Rhymes, MD  CC:  ROOM 17  Still hjas vaginal discomfort after completing antibiotic.  History of Present Illness: Sarah Mcguire is a 70 year old female who was recently treated for a polymicrobial UTI with cipro who presents today in follow up.   Denies white vaginal discharge or associated vaginal itching.  Notes + dysuria, and frequency thought these symptoms are improved since her last visit.  Completed cipro on 3/10.  Denies fever or low back pain.    Allergies: 1)  ! Pcn  Physical Exam  General:  Well-developed,well-nourished,in no acute distress; alert,appropriate and cooperative throughout examination Lungs:  Normal respiratory effort, chest expands symmetrically. Lungs are clear to auscultation, no crackles or wheezes. Heart:  Normal rate and regular rhythm. S1 and S2 normal without gallop, murmur, click, rub or other extra sounds. Abdomen:  negative CVAT   Impression & Recommendations:  Problem # 1:  UTI (ICD-599.0) Assessment Unchanged Will reculture urine.  Plan to treat with Bactrim (penicillin allergy) The following medications were removed from the medication list:    Cipro 250 Mg Tabs (Ciprofloxacin hcl) ..... One tablet by mouth two times a day x 3 days Her updated medication list for this problem includes:    Bactrim Ds 800-160 Mg Tabs (Sulfamethoxazole-trimethoprim) ..... One tablet by mouth two times a day x 7 days  Orders: UA Dipstick w/o Micro (manual) (16109) T-Culture, Urine ([address]11) Specimen Handling  (99[address])  Complete Medication List: 1)  Lotrel 5-20 Mg Caps (Amlodipine besy-benazepril hcl) .Marland Kitchen.. 1 by mouth once daily 2)  Nexium 40 Mg Cpdr (Esomeprazole magnesium) .... Once daily as needed 3)  Alprazolam 1 Mg Tabs (Alprazolam) .Marland Kitchen.. 1 by mouth qam; 2 by mouth qpm 4)  Maxzide-25 37.5-25 Mg Tabs (Triamterene-hctz) .... As needed. 5)  Tramadol Hcl 50 Mg Tabs (Tramadol hcl) .... As needed 6)  Mobic 7.5 Mg Tabs (Meloxicam) .... As needed 7)  Simvastatin 40 Mg Tabs (Simvastatin) .Marland Kitchen.. 1 by mouth qhs 8)  Mvi W/fe  9)  Folic Acid 1mg   .... 1 by mouth once daily 10)  Fenofibrate 160 Mg Tabs (Fenofibrate) .... Take one tablet daily 11)  Fluconazole 150 Mg Tabs (Fluconazole) .... One tablet by mouth x1 as needed vaginal itching or discharge 12)  One Touch Ultra Mini Test Strips  13)  Lancets  14)  Bactrim Ds 800-160 Mg Tabs (Sulfamethoxazole-trimethoprim) .... One tablet by mouth two times a day x 7 days  Patient Instructions: 1)  Call if fever over 101, if urinary symptoms worsen, or if they do not improve. Prescriptions: BACTRIM DS 800-160 MG TABS (SULFAMETHOXAZOLE-TRIMETHOPRIM) one tablet by mouth two times a day x 7 days  #14 x 0   Entered and Authorized by:   Lemont Fillers FNP   Signed by:   Lemont Fillers FNP on 11/15/2009   Method used:   Electronically to        [facility] [phone]* (retail)       [address]       [place], [place]  [address]       Ph: [phone]       Fax: [phone]   RxID:   [id number] FENOFIBRATE 160 MG TABS (FENOFIBRATE) take one tablet daily  #90 x 1   Entered by:   Mervin Kung CMA   Authorized by:   Lemont Fillers FNP   Signed by:   Mervin Kung CMA on 11/15/2009   Method used:   Electronically to        [facility] [phone]* (retail)       [address]       [place], [place]  [address]       Ph: [phone]       Fax: [phone]   RxID:    [id number]   Current Allergies (reviewed today): ! PCN  Laboratory Results   Urine Tests    Routine Urinalysis   Color: yellow Appearance: Clear Glucose: negative   (Normal Range: Negative) Bilirubin: negative   (Normal Range: Negative) Ketone: negative   (Normal Range: Negative) Spec. Gravity: <1.005   (Normal Range: 1.003-1.035) Blood: negative   (Normal Range: Negative) pH: 6.5   (Normal Range: 5.0-8.0) Protein: negative   (Normal Range: Negative) Urobilinogen: 0.2   (Normal Range: 0-1) Nitrite: negative   (Normal Range: Negative) Leukocyte Esterace: trace   (Normal Range: Negative)

## 2010-10-03 NOTE — Assessment & Plan Note (Signed)
Summary: feet still swelling//lch   Vital Signs:  Patient profile:   70 year old female Weight:      176 pounds Pulse rate:   100 / minute BP sitting:   138 / 80  (left arm)  Vitals Entered By: Doristine Devoid (February 20, 2010 1:45 PM) CC: swelling in feet xthurs.   History of Present Illness: 70 yo woman here today for swelling in feet.  no edema on Fri/Sat.  some swelling yesterday.  BP is creeping up.  pt unaware of the swelling until visible (ie, no tightness).  no CP, SOB, HAs, visual changes.    dysuria- started yesterday.  no frequency.  + urgency.  no hesitancy.  no blood.  Current Medications (verified): 1)  Nexium 40 Mg Cpdr (Esomeprazole Magnesium) .... Once Daily As Needed 2)  Alprazolam 1 Mg Tabs (Alprazolam) .Marland Kitchen.. 1 By Mouth Qam; 2 By Mouth Qpm 3)  Tramadol Hcl 50 Mg Tabs (Tramadol Hcl) .... As Needed-- Hold 4)  Mvi W/fe 5)  Folic Acid 1 Mg Tabs (Folic Acid) .... Take One Tablet Daily 6)  Onetouch Ultra Test  Strp (Glucose Blood) .... Test 1-2 Times Daily 7)  Onetouch Lancets  Misc (Lancets) .... Test 1-2 Times Daily 8)  Vicodin 5-500 Mg Tabs (Hydrocodone-Acetaminophen) .Marland Kitchen.. 1 Tab By Mouth Q4-6 As Needed For Severe Pain 9)  Furosemide 20 Mg Tabs (Furosemide) .... Take 1 Tablet By Mouth Daily.  May Take 2nd Tab As Needed For Swelling 10)  Proair Hfa 108 (90 Base) Mcg/act Aers (Albuterol Sulfate) .... 2 Puffs Every 4 Hours As Needed 11)  Fenofibrate 160 Mg Tabs (Fenofibrate) .... Take One Tablet Daily 12)  Simvastatin 40 Mg Tabs (Simvastatin) .... Take One Tablet Daily 13)  Lotrel 5-20 Mg Caps (Amlodipine Besy-Benazepril Hcl) .Marland Kitchen.. 1 Tab By Mouth Daily 14)  Macrobid 100 Mg Caps (Nitrofurantoin Monohyd Macro) .Marland Kitchen.. 1 Tab By Mouth Two Times A Day X7 Days  Allergies (verified): 1)  ! Pcn 2)  ! Amoxicillin (Amoxicillin) 3)  ! Celexa 4)  ! Zonegran (Zonisamide) 5)  ! Neurontin 6)  ! * Zonismide  Review of Systems      See HPI  Physical Exam  General:  well appearing,  ambulating, no O2 Lungs:  Normal respiratory effort, chest expands symmetrically. Lungs are clear to auscultation, no crackles or wheezes. Heart:  Normal rate and regular rhythm. S1 and S2 normal without gallop, murmur, click, rub or other extra sounds. Abdomen:  soft, NT/ND, +BS, no suprapubic or CVA tenderness Pulses:  +2 carotid, radial, DP Extremities:  No clubbing, cyanosis, edema, or deformity noted   Impression & Recommendations:  Problem # 1:  HYPERTENSION (ICD-401.9) Assessment Unchanged BP creeping up as pt continues to recover from recent illness.  switch plain lisinopril for pt's previous dose of Lotrel.  check BMP. The following medications were removed from the medication list:    Lisinopril 10 Mg Tabs (Lisinopril) .Marland Kitchen... Take one tablet daily Her updated medication list for this problem includes:    Furosemide 20 Mg Tabs (Furosemide) .Marland Kitchen... Take 1 tablet by mouth daily.  may take 2nd tab as needed for swelling    Lotrel 5-20 Mg Caps (Amlodipine besy-benazepril hcl) .Marland Kitchen... 1 tab by mouth daily  Orders: Venipuncture (42706) TLB-BMP (Basic Metabolic Panel-BMET) (80048-METABOL)  Problem # 2:  UTI (ICD-599.0) Assessment: Unchanged pt's UA and sxs consistent w/ possible UTI.  given recent sepsis will start abx and stop if cx comes back negative. The following medications were removed  from the medication list:    Ciprofloxacin Hcl 500 Mg Tabs (Ciprofloxacin hcl) .Marland Kitchen... Take 1 tablet by mouth two times a day for 2 weeks    Metronidazole 500 Mg Tabs (Metronidazole) .Marland Kitchen... Take 1 tablet by mouth three times a day for two weeks  Orders: Specimen Handling (69629) T-Culture, Urine ([address]41-[address]40)  The following medications were removed from the medication list:    Ciprofloxacin Hcl 500 Mg Tabs (Ciprofloxacin hcl) .Marland Kitchen... Take 1 tablet by mouth two times a day for 2 weeks    Metronidazole 500 Mg Tabs (Metronidazole) .Marland Kitchen... Take 1 tablet by mouth three times a day for two weeks Her updated  medication list for this problem includes:    Macrobid 100 Mg Caps (Nitrofurantoin monohyd macro) .Marland Kitchen... 1 tab by mouth two times a day x7 days  Problem # 3:  DIABETES MELLITUS (ICD-250.00) Assessment: Unchanged due for A1C today The following medications were removed from the medication list:    Lisinopril 10 Mg Tabs (Lisinopril) .Marland Kitchen... Take one tablet daily Her updated medication list for this problem includes:    Lotrel 5-20 Mg Caps (Amlodipine besy-benazepril hcl) .Marland Kitchen... 1 tab by mouth daily  Orders: TLB-A1C / Hgb A1C (Glycohemoglobin) (83036-A1C)  Problem # 4:  EDEMA- LOCALIZED (ICD-782.3) Assessment: New pt scheduled appt on Thurs and has not really had sxs since.  will add 2nd lasix as needed for PM edema.  Pt expresses understanding and is in agreement w/ this plan. Her updated medication list for this problem includes:    Furosemide 20 Mg Tabs (Furosemide) .Marland Kitchen... Take 1 tablet by mouth daily.  may take 2nd tab as needed for swelling  Complete Medication List: 1)  Nexium 40 Mg Cpdr (Esomeprazole magnesium) .... Once daily as needed 2)  Alprazolam 1 Mg Tabs (Alprazolam) .Marland Kitchen.. 1 by mouth qam; 2 by mouth qpm 3)  Tramadol Hcl 50 Mg Tabs (Tramadol hcl) .... As needed-- hold 4)  Mvi W/fe  5)  Folic Acid 1 Mg Tabs (Folic acid) .... Take one tablet daily 6)  Onetouch Ultra Test Strp (Glucose blood) .... Test 1-2 times daily 7)  Onetouch Lancets Misc (Lancets) .... Test 1-2 times daily 8)  Vicodin 5-500 Mg Tabs (Hydrocodone-acetaminophen) .Marland Kitchen.. 1 tab by mouth q4-6 as needed for severe pain 9)  Furosemide 20 Mg Tabs (Furosemide) .... Take 1 tablet by mouth daily.  may take 2nd tab as needed for swelling 10)  Proair Hfa 108 (90 Base) Mcg/act Aers (Albuterol sulfate) .... 2 puffs every 4 hours as needed 11)  Fenofibrate 160 Mg Tabs (Fenofibrate) .... Take one tablet daily 12)  Simvastatin 40 Mg Tabs (Simvastatin) .... Take one tablet daily 13)  Lotrel 5-20 Mg Caps (Amlodipine besy-benazepril  hcl) .Marland Kitchen.. 1 tab by mouth daily 14)  Macrobid 100 Mg Caps (Nitrofurantoin monohyd macro) .Marland Kitchen.. 1 tab by mouth two times a day x7 days  Patient Instructions: 1)  Keep your appt as scheduled 2)  We'll notify you of your lab results 3)  Take a 2nd Lasix pill at the end of the day if you notice increased swelling 4)  STOP the lisinopril and RESTART the Lotrel 5)  Call with any questions or concerns 6)  Your urine looks like there may be infxn, start the macrobid and if the culture is negative we'll call you to stop the meds 7)  See you soon! Prescriptions: MACROBID 100 MG CAPS (NITROFURANTOIN MONOHYD MACRO) 1 tab by mouth two times a day x7 days  #14 x 0  Entered and Authorized by:   Neena Rhymes MD   Signed by:   Neena Rhymes MD on 02/20/2010   Method used:   Electronically to        [facility] [phone]* (retail)       [address]       [place], [place]  [address]       Ph: [phone]       Fax: [phone]   RxID:   [id number] FUROSEMIDE 20 MG TABS (FUROSEMIDE) Take 1 tablet by mouth daily.  may take 2nd tab as needed for swelling  #60 x 1   Entered and Authorized by:   Neena Rhymes MD   Signed by:   Neena Rhymes MD on 02/20/2010   Method used:   Electronically to        [facility] [phone]* (retail)       [address]       [place], [place]  [address]       Ph: [phone]       Fax: [phone]   RxID:   [id number] LOTREL 5-20 MG CAPS (AMLODIPINE BESY-BENAZEPRIL HCL) 1 tab by mouth daily  #30 x 3   Entered and Authorized by:   Neena Rhymes MD   Signed by:   Neena Rhymes MD on 02/20/2010   Method used:   Electronically to        [facility] [phone]* (retail)       [address]       [place], [place]  [address]       Ph: [phone]       Fax: [phone]   RxID:   [id number]   Laboratory Results   Urine Tests    Routine Urinalysis     Glucose: negative   (Normal Range: Negative) Bilirubin: negative   (Normal Range: Negative) Ketone: negative   (Normal Range: Negative) Spec. Gravity: 1.010   (Normal Range: 1.003-1.035) Blood: negative   (Normal Range: Negative) pH: 6.0   (Normal Range: 5.0-8.0) Protein: negative   (Normal Range: Negative) Urobilinogen: 0.2   (Normal Range: 0-1) Nitrite: negative   (Normal Range: Negative) Leukocyte Esterace: small   (Normal Range: Negative)

## 2010-10-03 NOTE — Assessment & Plan Note (Signed)
Summary: CPX AND FASTING LABS/KDC  Flu Vaccine Consent Questions     Do you have a history of severe allergic reactions to this vaccine? no    Any prior history of allergic reactions to egg and/or gelatin? no    Do you have a sensitivity to the preservative Thimersol? no    Do you have a past history of Guillan-Barre Syndrome? no    Do you currently have an acute febrile illness? no    Have you ever had a severe reaction to latex? no    Vaccine information given and explained to patient? yes    Are you currently pregnant? no    Lot Number:[id number]   Exp Date:03/03/2011   Site Given  Right Deltoid IM    Vital Signs:  Patient profile:   70 year old female Height:      63.5 inches Weight:      175 pounds BMI:     30.62 Temp:     97.0 degrees F oral Pulse rate:   82 / minute BP sitting:   124 / 78  (left arm)  Vitals Entered By: Doristine Devoid CMA (June 07, 2010 10:00 AM) CC: YEARLY AND LABS    History of Present Illness: 70 yo woman here today for CPE.  Here for Medicare AWV:  1.   Risk factors based on Past M, S, F history: HTN- BP well controlled.  no CP, HAs, visual changes, SOB, edema Hyperlipidemia- tolerating statin w/out difficulty.  no abd pain, N/V, myalgias DM- CBG yesterday was 71.  has not been over 90 'since i don't know when'. 2.   Physical Activities: active, walking regularly 3.   Depression/mood: mood is 'good' 4.   Hearing: normal to whispered voice at 6 feet 5.   ADL's: independent 6.   Fall Risk: low risk 7.   Home Safety: safe at home, lives w/ husband 8.   Height, weight, &visual acuity: see vitals, vision corrected to 20/20 w/ glasses 9.   Counseling: provided on diet and exercise. 10.   Labs ordered based on risk factors:  11.           Referral Coordination: UTD on pap, colonoscopy.  refuses mammogram 12.           Care Plan: see A&P 13.           Cognitive Assessment: linear thought process, memory normal, AAOx3  Preventive  Screening-Counseling & Management  Alcohol-Tobacco     Alcohol drinks/day: 0     Smoking Status: current     Smoking Cessation Counseling: yes     Smoke Cessation Stage: contemplative     Packs/Day: 0.25  Caffeine-Diet-Exercise     Does Patient Exercise: yes     Type of exercise: walking      Sexual History:  currently monogamous.        Drug Use:  never.    Current Medications (verified): 1)  Nexium 40 Mg Cpdr (Esomeprazole Magnesium) .... Once Daily As Needed 2)  Alprazolam 1 Mg Tabs (Alprazolam) .Marland Kitchen.. 1 By Mouth Qam; 2 By Mouth Qpm 3)  Qc Daily Multivitamins/iron  Tabs (Multiple Vitamins-Iron) .... Take One Tablet Daily 4)  Folic Acid 1 Mg Tabs (Folic Acid) .... Take One Tablet Daily 5)  Onetouch Ultra Test  Strp (Glucose Blood) .... Test 1-2 Times Daily 6)  Onetouch Lancets  Misc (Lancets) .... Test 1-2 Times Daily 7)  Vicodin 5-500 Mg Tabs (Hydrocodone-Acetaminophen) .Marland Kitchen.. 1 Tab By Mouth Q4-6  As Needed For Severe Pain 8)  Furosemide 20 Mg Tabs (Furosemide) .... Take 1 Tablet By Mouth Daily.  May Take 2nd Tab As Needed For Swelling 9)  Proair Hfa 108 (90 Base) Mcg/act Aers (Albuterol Sulfate) .... 2 Puffs Every 4 Hours As Needed 10)  Fenofibrate 160 Mg Tabs (Fenofibrate) .... Take One Tablet Daily 11)  Simvastatin 40 Mg Tabs (Simvastatin) .... Take One Tablet Daily 12)  Lotrel 5-20 Mg Caps (Amlodipine Besy-Benazepril Hcl) .Marland Kitchen.. 1 Tab By Mouth Daily 13)  Diflucan 150 Mg Tabs (Fluconazole) .... Once Daily.  May Repeat in 3 Days If Sxs Persist 14)  Calcium 750mg  .... Take One Tablet Daily  Allergies (verified): 1)  ! Pcn 2)  ! Amoxicillin (Amoxicillin) 3)  ! Celexa 4)  ! Zonegran (Zonisamide) 5)  ! Neurontin 6)  ! 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 Alcohol Use - no  Social History: Packs/Day:  0.25  Review of Systems  The patient denies anorexia, fever, weight loss, weight gain, vision loss, decreased hearing, hoarseness, chest pain, syncope, dyspnea on exertion, peripheral edema, prolonged cough, headaches, abdominal pain, melena, hematochezia, severe indigestion/heartburn, hematuria, suspicious skin lesions, depression, abnormal bleeding, enlarged lymph nodes, and breast masses.    Physical Exam  General:  Well-developed,well-nourished,in no acute distress; alert,appropriate and cooperative throughout examination Head:  Normocephalic and atraumatic without obvious abnormalities. No apparent alopecia or balding.  Eyes:  PERRL, EOMI Ears:  R ear tube lying in canal, TMs WNL bilaterally Nose:  External nasal examination shows no deformity or inflammation. Nasal mucosa are pink and moist without lesions or exudates. Mouth:  Oral mucosa and oropharynx without lesions or exudates.  Teeth in good repair. Neck:  No deformities, masses, or tenderness noted. Breasts:  No mass, nodules, thickening, tenderness, bulging, retraction, inflamation, nipple discharge or skin changes noted.   Lungs:  Normal respiratory effort, chest expands symmetrically. Lungs are clear to auscultation, no crackles or  wheezes. Heart:  Normal rate and regular rhythm. S1 and S2 normal without gallop, murmur, click, rub or other extra sounds. Abdomen:  soft, NT/ND, +BS Pulses:  +2 radial, DP/PT Extremities:  No clubbing, cyanosis, edema, or deformity noted with normal full range of motion of all joints.   Neurologic:  No cranial nerve deficits noted. Station and gait are normal. Plantar reflexes are down-going bilaterally. DTRs are symmetrical throughout. Sensory, motor and coordinative functions appear intact. Skin:  Intact without suspicious lesions or rashes Cervical Nodes:  No lymphadenopathy noted Axillary Nodes:  No palpable lymphadenopathy Psych:  Oriented X3, memory intact for recent and remote, normally interactive, good eye contact, not anxious appearing, and not depressed appearing.    Diabetes Management Exam:    Foot Exam (with socks  and/or shoes not present):       Sensory-Pinprick/Light touch:          Left medial foot (L-4): normal          Left dorsal foot (L-5): normal          Left lateral foot (S-1): normal          Right medial foot (L-4): normal          Right dorsal foot (L-5): normal          Right lateral foot (S-1): normal       Sensory-Monofilament:          Left foot: normal          Right foot: normal       Inspection:          Left foot: normal          Right foot: normal       Nails:          Left foot: normal          Right foot: normal   Impression & Recommendations:  Problem # 1:  PREVENTIVE HEALTH CARE (ICD-V70.0) Assessment Unchanged  pt's PE WNL.  UTD on health maintainence w/ exception of mammogram (which she refuses).  pt to call insurance company and determine whether they cover the pneumovax and zostavax.  Orders: Medicare -1st Annual Wellness Visit [phone])  Problem # 2:  HYPERTENSION (ICD-401.9) Assessment: Unchanged well controlled today.  asymptomatic.  no changes. Her updated medication list for this problem includes:    Furosemide 20 Mg Tabs  (Furosemide) .Marland Kitchen... Take 1 tablet by mouth daily.  may take 2nd tab as needed for swelling    Lotrel 5-20 Mg Caps (Amlodipine besy-benazepril hcl) .Marland Kitchen... 1 tab by mouth daily  Orders: Specimen Handling (56213) TLB-BMP (Basic Metabolic Panel-BMET) (80048-METABOL) TLB-CBC Platelet - w/Differential (85025-CBCD)  Problem # 3:  HYPERLIPIDEMIA (ICD-272.4) Assessment: Unchanged due for labs today.  tolerating statin w/out difficulty.  will switch to lipitor once it is generic given the new info about simvastatin/amlodipine interactions.  pt aware, is in agreement. Her updated medication list for this problem includes:    Fenofibrate 160 Mg Tabs (Fenofibrate) .Marland Kitchen... Take one tablet daily    Simvastatin 40 Mg Tabs (Simvastatin) .Marland Kitchen... Take one tablet daily  Orders: TLB-Lipid Panel (80061-LIPID) TLB-Hepatic/Liver Function Pnl (80076-HEPATIC)  Problem # 4:  DIABETES MELLITUS (ICD-250.00) Assessment: Unchanged pt reports fasting CBGs are excellently controlled.  check A1C and add meds if needed. Her updated medication list for this problem includes:    Lotrel 5-20 Mg Caps (Amlodipine besy-benazepril hcl) .Marland Kitchen... 1 tab by mouth daily  Orders: Venipuncture (08657) Specimen Handling (84696) TLB-A1C / Hgb A1C (Glycohemoglobin) (83036-A1C) TLB-TSH (Thyroid Stimulating Hormone) (84443-TSH)  Complete Medication List: 1)  Nexium 40 Mg Cpdr (Esomeprazole magnesium) .... Once daily as needed 2)  Alprazolam 1 Mg Tabs (Alprazolam) .Marland Kitchen.. 1 by mouth qam; 2 by mouth qpm 3)  Qc Daily Multivitamins/iron Tabs (Multiple vitamins-iron) .... Take one tablet daily 4)  Folic Acid 1 Mg Tabs (Folic acid) .... Take one tablet daily 5)  Onetouch Ultra Test Strp (Glucose blood) .... Test 1-2 times daily 6)  Onetouch Lancets Misc (Lancets) .... Test 1-2 times daily 7)  Vicodin 5-500 Mg Tabs (Hydrocodone-acetaminophen) .Marland Kitchen.. 1 tab by mouth q4-6 as needed for severe pain 8)  Furosemide 20 Mg Tabs (Furosemide) .... Take 1 tablet  by mouth daily.  may take 2nd tab as needed for  swelling 9)  Proair Hfa 108 (90 Base) Mcg/act Aers (Albuterol sulfate) .... 2 puffs every 4 hours as needed 10)  Fenofibrate 160 Mg Tabs (Fenofibrate) .... Take one tablet daily 11)  Simvastatin 40 Mg Tabs (Simvastatin) .... Take one tablet daily 12)  Lotrel 5-20 Mg Caps (Amlodipine besy-benazepril hcl) .Marland Kitchen.. 1 tab by mouth daily 13)  Diflucan 150 Mg Tabs (Fluconazole) .... Once daily.  may repeat in 3 days if sxs persist 14)  Calcium 750mg   .... Take one tablet daily  Other Orders: T-Culture, Urine ([address]-44010) UA Dipstick w/o Micro (manual) (27253) Flu Vaccine 73yrs + MEDICARE PATIENTS (G6440) Administration Flu vaccine - MCR (H4742)  Patient Instructions: 1)  Follow up in 3 months to recheck the diabetes- you can eat before this appt 2)  Your exam looks great!  Keep up the good work! 3)  We'll notify you of your lab results 4)  Call with any questions or concerns 5)  Have a great holiday season! Prescriptions: FOLIC ACID 1 MG TABS (FOLIC ACID) take one tablet daily  #30 x 11   Entered and Authorized by:   Neena Rhymes MD   Signed by:   Neena Rhymes MD on 06/07/2010   Method used:   Print then Give to Patient   RxID:   [id number] VICODIN 5-500 MG TABS (HYDROCODONE-ACETAMINOPHEN) 1 tab by mouth Q4-6 as needed for severe pain  #20 x 0   Entered and Authorized by:   Neena Rhymes MD   Signed by:   Neena Rhymes MD on 06/07/2010   Method used:   Print then Give to Patient   RxID:   [id number] LOTREL 5-20 MG CAPS (AMLODIPINE BESY-BENAZEPRIL HCL) 1 tab by mouth daily  #30 x 6   Entered and Authorized by:   Neena Rhymes MD   Signed by:   Neena Rhymes MD on 06/07/2010   Method used:   Electronically to        [facility] [phone]* (retail)       [address]       [place], [place]  [address]       Ph: [phone]       Fax: [phone]   RxID:   [id number] FENOFIBRATE  160 MG TABS (FENOFIBRATE) take one tablet daily  #30 x 6   Entered and Authorized by:   Neena Rhymes MD   Signed by:   Neena Rhymes MD on 06/07/2010   Method used:   Electronically to        [facility] [phone]* (retail)       [address]       [place], [place]  [address]       Ph: [phone]       Fax: [phone]   RxID:   [id number]   Laboratory Results   Urine Tests    Routine Urinalysis   Glucose: negative   (Normal Range: Negative) Bilirubin: negative   (Normal Range: Negative) Ketone: negative   (Normal Range: Negative) Spec. Gravity: 1.015   (Normal Range: 1.003-1.035) Blood: negative   (Normal Range: Negative) pH: 6.0   (Normal Range: 5.0-8.0) Protein: negative   (Normal Range: Negative) Urobilinogen: 0.2   (Normal Range: 0-1) Nitrite: negative   (Normal Range: Negative) Leukocyte Esterace: trace   (Normal Range: Negative)

## 2010-10-03 NOTE — Progress Notes (Signed)
Summary: urine cx  Phone Note Outgoing Call   Call placed by: Doristine Devoid CMA,  June 09, 2010 1:28 PM Call placed to: Patient Summary of Call: pt w/ UTI.  should start Cipro 500mg  two times a day x5 days.  #10, no refills.  Follow-up for Phone Call        left message on machine ............Marland KitchenDoristine Devoid CMA  June 09, 2010 1:29 PM   spoke w/ patient husband aware of results.....Marland KitchenMarland KitchenDoristine Devoid CMA  June 09, 2010 4:10 PM     New/Updated Medications: CIPROFLOXACIN HCL 500 MG TABS (CIPROFLOXACIN HCL) take two times a day x5 days Prescriptions: CIPROFLOXACIN HCL 500 MG TABS (CIPROFLOXACIN HCL) take two times a day x5 days  #10 x 0   Entered by:   Doristine Devoid CMA   Authorized by:   Neena Rhymes MD   Signed by:   Doristine Devoid CMA on 06/09/2010   Method used:   Electronically to        [facility] [phone]* (retail)       [address]       [place], [place]  [address]       Ph: [phone]       Fax: [phone]   RxID:   [id number]   Appended Document: urine cx    Clinical Lists Changes  Medications: Rx of DIFLUCAN 150 MG TABS (FLUCONAZOLE) once daily.  may repeat in 3 days if sxs persist;  #2 x 0;  Signed;  Entered by: Doristine Devoid CMA;  Authorized by: Neena Rhymes MD;  Method used: Electronically to [facility] #[id number]*, [address], [place], [place], [place]  [address], Ph: [phone], Fax: [phone]    Prescriptions: DIFLUCAN 150 MG TABS (FLUCONAZOLE) once daily.  may repeat in 3 days if sxs persist  #2 x 0   Entered by:   Doristine Devoid CMA   Authorized by:   Neena Rhymes MD   Signed by:   Doristine Devoid CMA on 06/09/2010   Method used:   Electronically to        [facility] [phone]* (retail)       [address]       [place], [place]  [address]       Ph: [phone]       Fax: [phone]   RxID:   [id number]

## 2010-10-03 NOTE — Progress Notes (Signed)
Summary: urine cx-lmom  Phone Note Outgoing Call   Call placed by: Doristine Devoid CMA,  April 06, 2010 10:32 AM Call placed to: Patient Summary of Call: pt has enterococcus in her urine.  given recent illness we should treat this.  start macrobid two times a day x7 days, #14, no refills  Follow-up for Phone Call        spoke w/ patient aware of urine cx results ........Marland KitchenDoristine Devoid CMA  April 06, 2010 10:35 AM     New/Updated Medications: MACROBID 100 MG CAPS (NITROFURANTOIN MONOHYD MACRO) take one tablet two times a day Prescriptions: MACROBID 100 MG CAPS (NITROFURANTOIN MONOHYD MACRO) take one tablet two times a day  #14 x 0   Entered by:   Doristine Devoid CMA   Authorized by:   Neena Rhymes MD   Signed by:   Doristine Devoid CMA on 04/06/2010   Method used:   Electronically to        [facility] [phone]* (retail)       [address]       [place], [place]  [address]       Ph: [phone]       Fax: [phone]   RxID:   [id number]

## 2010-10-03 NOTE — Assessment & Plan Note (Signed)
Summary: ROB--FOR 30 MINUTE--PH   Vital Signs:  Patient profile:   70 year old female Weight:      178 pounds O2 Sat:      92 % on Room air Pulse rate:   125 / minute BP sitting:   132 / 84  (left arm)  Vitals Entered By: Doristine Devoid (February 01, 2010 3:05 PM)  O2 Flow:  Room air CC: roa and diabetes check    History of Present Illness: 70 yo woman here today for f/u on  1) DM- CBGs running 98-124 fasting.  not currently on any meds.  no symptomatic lows.  2) Hyperlipidemia- has been holding Simvastatin and fenofibrate due to Diflucan use.  has stopped Diflucan.  denies abd pain.  3) HTN- has been holding Lotrel since D/C.  currently on Lasix.  ideally needs to restart ACE given DM.  denies CP, SOB, edema, HAs, visual changes, N/V.  4) abd abscesses- ID feels that these were caused from recurrent diverticulitis and scheduled pt to see a surgeon to discuss bowel resection.  pt feels she's not in a place to even hear what they have to say and has no intention at this time of preceeding w/ surgery.  wants to cancel appt.  Current Medications (verified): 1)  Nexium 40 Mg Cpdr (Esomeprazole Magnesium) .... Once Daily As Needed 2)  Alprazolam 1 Mg Tabs (Alprazolam) .Marland Kitchen.. 1 By Mouth Qam; 2 By Mouth Qpm 3)  Tramadol Hcl 50 Mg Tabs (Tramadol Hcl) .... As Needed-- Hold 4)  Mvi W/fe 5)  Folic Acid 1 Mg Tabs (Folic Acid) .... Take One Tablet Daily 6)  Onetouch Ultra Test  Strp (Glucose Blood) .... Test 1-2 Times Daily 7)  Onetouch Lancets  Misc (Lancets) .... Test 1-2 Times Daily 8)  Vicodin 5-500 Mg Tabs (Hydrocodone-Acetaminophen) .Marland Kitchen.. 1 Tab By Mouth Q4-6 As Needed For Severe Pain 9)  Furosemide 20 Mg Tabs (Furosemide) .... Take 1 Tablet By Mouth Once A Day Per Hospital Md 10)  Proair Hfa 108 [phone] Base) Mcg/act Aers (Albuterol Sulfate) .... 2 Puffs Every 4 Hours As Needed 11)  Ciprofloxacin Hcl 500 Mg Tabs (Ciprofloxacin Hcl) .... Take 1 Tablet By Mouth Two Times A Day For 2 Weeks 12)   Metronidazole 500 Mg Tabs (Metronidazole) .... Take 1 Tablet By Mouth Three Times A Day For Two Weeks 13)  Lisinopril 10 Mg Tabs (Lisinopril) .... Take One Tablet Daily  Allergies (verified): 1)  ! Pcn 2)  ! Amoxicillin (Amoxicillin) 3)  ! Celexa 4)  ! Zonegran (Zonisamide) 5)  ! Neurontin 6)  ! * Zonismide  Past History:  Past Medical History: Last updated: 01/25/2010 Hyperlipidemia Hypertension Volume overload and heart failure after fluid rescuscitation for septic shock Echo 02/2009 normal EF, Echo during sepsis 4/11 EF 45-50% Sepsis secondary to liver abscesses, including hypotension or shock.  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 Alcohol Use - no  Review of Systems      See HPI  Physical Exam  General:  well appearing, ambulating, no O2 Head:  Normocephalic and atraumatic without obvious abnormalities. No apparent alopecia or balding.  Mouth:  Oral mucosa and oropharynx without lesions or exudates.  Teeth in good repair. Neck:  No deformities, masses, or tenderness noted. Lungs:  Normal respiratory effort, chest expands symmetrically. Lungs are clear to auscultation, no crackles or wheezes. Heart:  Normal rate and regular rhythm. S1 and S2 normal without gallop, murmur, click, rub or other extra sounds. Abdomen:  soft, NT/ND, +BS Pulses:  +2 carotid, radial, DP Extremities:  No clubbing, cyanosis, edema, or deformity noted Psych:  much happier today   Impression & Recommendations:  Problem # 1:  DIABETES MELLITUS (ICD-250.00) Assessment Unchanged pt due for A1C but not willing to give blood.  given CBG log pt's glucose is well controlled and can defer A1C until next  visit.  pt to continue to control DM w/ diet and exercise as she is again able. Her updated medication list for this problem includes:    Lisinopril 10 Mg Tabs (Lisinopril) .Marland Kitchen... Take one tablet daily  Problem # 2:  HYPERTENSION (ICD-401.9) Assessment: Unchanged BP able to tolerate ACE re-start.  cautioned pt on low BP or high BP as she continues to rebound from this illness.  will continue Lasix until cards d/c's med (or until it is obvious that pt no longer needs this) Her updated medication list for this problem includes:    Furosemide 20 Mg Tabs (Furosemide) .Marland Kitchen... Take 1 tablet by mouth once a day per hospital md    Lisinopril 10 Mg Tabs (Lisinopril) .Marland Kitchen... Take one tablet daily  Problem # 3:  HYPERLIPIDEMIA (ICD-272.4) Assessment: Unchanged will restart meds as pt has finished diflucan.  Problem # 4:  ABSCESS OF LIVER (ICD-572.0) Assessment: Unchanged pt not open to idea of surgical consultation at this time.  have a feeling she will cancel appt despite my suggestion of going to hear what they have to say.  told pt it is ultimately up to her.  will follow.  Complete Medication List: 1)  Nexium 40 Mg Cpdr (Esomeprazole magnesium) .... Once daily as needed 2)  Alprazolam 1 Mg Tabs (Alprazolam) .Marland Kitchen.. 1 by mouth qam; 2 by mouth qpm 3)  Tramadol Hcl 50 Mg Tabs (Tramadol hcl) .... As needed-- hold 4)  Mvi W/fe  5)  Folic Acid 1 Mg Tabs (Folic acid) .... Take one tablet daily 6)  Onetouch Ultra Test Strp (Glucose blood) .... Test 1-2 times daily 7)  Onetouch Lancets Misc (Lancets) .... Test 1-2 times daily 8)  Vicodin 5-500 Mg Tabs (Hydrocodone-acetaminophen) .Marland Kitchen.. 1 tab by mouth q4-6 as needed for severe pain 9)  Furosemide 20 Mg Tabs (Furosemide) .... Take 1 tablet by mouth once a day per hospital md 10)  Proair Hfa 108 (90 Base) Mcg/act Aers (Albuterol sulfate) .... 2 puffs every 4 hours as needed 11)  Ciprofloxacin Hcl 500 Mg Tabs (Ciprofloxacin hcl) .... Take 1 tablet by mouth two times  a day for 2 weeks 12)  Metronidazole 500 Mg Tabs (Metronidazole) .... Take 1 tablet by mouth three times a day for two weeks 13)  Lisinopril 10 Mg Tabs (Lisinopril) .... Take one tablet daily  Patient Instructions: 1)  Follow up in 1 month- you do not need to fast for this 2)  Start the Lisinopril daily (this is in place of the Lotrel) 3)  Restart the Simvastatin and Fenofibrate 4)  Continue the Lasix until cardiology tells you otherwise 5)  The surgery appt is up to you- if you aren't in a place to hear what they have to say, cancel it and we can always reschedule 6)  If your BP is consistently >140/90 please call me 7)  Continue to check your blood sugar every other  day 8)  Call with any questions or concerns 9)  You look great!  Keep up the good work! Prescriptions: FUROSEMIDE 20 MG TABS (FUROSEMIDE) Take 1 tablet by mouth once a day per hospital MD  #30 x 0   Entered by:   Doristine Devoid   Authorized by:   Neena Rhymes MD   Signed by:   Doristine Devoid on 02/01/2010   Method used:   Electronically to        [facility] [phone]* (retail)       [address]       [place], [place]  [address]       Ph: [phone]       Fax: [phone]   RxID:   [id number] LISINOPRIL 10 MG TABS (LISINOPRIL) take one tablet daily  #30 x 1   Entered by:   Doristine Devoid   Authorized by:   Neena Rhymes MD   Signed by:   Doristine Devoid on 02/01/2010   Method used:   Electronically to        [facility] [phone]* (retail)       [address]       [place], [place]  [address]       Ph: [phone]       Fax: [phone]   RxID:   [id number]

## 2010-10-03 NOTE — Progress Notes (Signed)
Summary: lab result, appt.  Phone Note Outgoing Call   Summary of Call: Please call Sarah Mcguire and let her know that her labwork is + for diabetes.  Pls arrange a follow up visit so that we may further discuss diet etc. Initial call taken by: Lemont Fillers FNP,  November 07, 2009 2:21 PM  Follow-up for Phone Call        Notified pt. of lab result and need to schedule appt. to discuss treatment of diabetes.  Pt. will be out of town tomorrow. Appt. made for 11/09/09 @ 8:15 with Efraim Kaufmann. Follow-up by: Mervin Kung CMA,  November 07, 2009 5:29 PM

## 2010-10-03 NOTE — Assessment & Plan Note (Signed)
Summary: temp, sore back//lch   Vital Signs:  Patient profile:   70 year old female Weight:      181 pounds Temp:     98.3 degrees F oral BP sitting:   120 / Sarah  (left arm)  Vitals Entered By: Doristine Devoid (December 20, 2009 9:45 AM) CC: temp up to 102 and lower back pain xsat.    History of Present Illness: 70 yo Mcguire here today for temp up to 102.4 saturday.  improved w/ tylenol to 100.  reports sinus HA- taking [facility] severe allergy and sinus.  + nasal congestion and drainage.  no sore throat, ear pain.  + cough- productive in AM.  + body aches.    LBP- pt developed LBP after PT on Thursday when hip was 'popped'.  no radiation of pain into legs.  no numbness or weakness.  would like pain meds.  Allergies (verified): 1)  ! 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 No apparent alopecia or balding.  mild TTP over frontal sinuses Eyes:  no injxn or inflammation Ears:  R ear tube lying in canal, TMs WNL bilaterally Nose:  edematous turbinates Mouth:  +PND Neck:  No deformities, masses, or tenderness noted. Lungs:  Normal respiratory effort, chest expands symmetrically. Lungs are clear to auscultation, no crackles or wheezes. Heart:  Normal rate and regular rhythm. S1 and S2 normal without gallop, murmur, click, rub or other extra sounds. Msk:  + TTP along lumbar spine.  good flexion and extension, (-) SLR bilaterally Pulses:  +2 carotid, radial, DP Extremities:  No clubbing, cyanosis, edema, or deformity noted   Impression & Recommendations:  Problem # 1:  SINUSITIS- ACUTE-NOS  (ICD-461.9) Assessment Unchanged pt's sxs consistent w/ sinusitis.  start Zpack.  reviewed supportive care and red flags that should prompt return.  Pt expresses understanding and is in agreement w/ this plan. Her updated medication list for this problem includes:    Azithromycin 250 Mg Tabs (Azithromycin) .Marland Kitchen... 2 by  mouth today and then 1 daily for 4 days  Problem # 2:  BACK PAIN, CHRONIC (ICD-724.5) Assessment: Unchanged back pain likely due to PT, no red flags on hx or PE.  vicodin provided at pt's request. Her updated medication list for this problem includes:    Tramadol Hcl 50 Mg Tabs (Tramadol hcl) .Marland Kitchen... As needed    Mobic 7.5 Mg Tabs (Meloxicam) .Marland Kitchen... As needed    Vicodin 5-500 Mg Tabs (Hydrocodone-acetaminophen) .Marland Kitchen... 1 tab by mouth q4-6 as needed for severe pain  Orders: [facility]A Dipstick w/o Micro (manual) (29562)  Complete Medication List: 1)  Lotrel 5-20 Mg Caps (Amlodipine besy-benazepril hcl) .Marland Kitchen.. 1 by mouth once daily 2)  Nexium 40 Mg Cpdr (Esomeprazole magnesium) .... Once daily as needed 3)  Alprazolam 1 Mg Tabs (Alprazolam) .Marland Kitchen.. 1 by mouth qam; 2 by mouth qpm 4)  Tramadol Hcl 50 Mg Tabs (Tramadol hcl) .... As needed 5)  Mobic 7.5 Mg Tabs (Meloxicam) .... As needed 6)  Simvastatin 40 Mg Tabs (Simvastatin) .Marland Kitchen.. 1 by mouth qhs 7)  Mvi W/fe  8)  Folic Acid 1 Mg Tabs (Folic acid) .... Take one tablet daily 9)  Fenofibrate 160 Mg  Tabs (Fenofibrate) .... Take one tablet daily 10)  One Touch Ultra Mini Test Strips  11)  Lancets  12)  Azithromycin 250 Mg Tabs (Azithromycin) .... 2 by  mouth today and then 1 daily for 4 days 13)  Vicodin 5-500 Mg Tabs (Hydrocodone-acetaminophen) .Marland Kitchen.. 1 tab by mouth q4-6 as needed for severe pain  Patient Instructions: 1)  Follow up as scheduled 2)  This is a sinus infection- take the azithromycin as directed 3)  Continue the tylenol for pain and fever 4)  Use the Sinus meds as needed 5)  Mucinex to thin your congestion and allow drainage 6)   Use the Vicodin for your severe pain- do not take w/ extra tylenol 7)  Hang in there!! Prescriptions: VICODIN 5-500 MG TABS (HYDROCODONE-ACETAMINOPHEN) 1 tab by mouth Q4-6 as needed for severe pain  #20 x 0   Entered and Authorized by:   Neena Rhymes MD   Signed by:   Neena Rhymes MD on 12/20/2009   Method used:   Print then Give to Patient   RxID:   [id number] AZITHROMYCIN 250 MG  TABS (AZITHROMYCIN) 2 by  mouth today and then 1 daily for 4 days  #6 x 0   Entered and Authorized by:   Neena Rhymes MD   Signed by:   Neena Rhymes MD on 12/20/2009   Method used:   Print then Give to Patient   RxID:   [id number]   Laboratory Results   Urine Tests    Routine Urinalysis   Glucose: negative   (Normal Range: Negative) Bilirubin: negative   (Normal Range: Negative) Ketone: negative   (Normal Range: Negative) Spec. Gravity: 1.020   (Normal Range: 1.003-1.035) Blood: negative   (Normal Range: Negative) pH: 6.0   (Normal Range: 5.0-8.0) Protein: 100   (Normal Range: Negative) Urobilinogen: 0.2   (Normal Range: 0-1) Nitrite: negative   (Normal Range: Negative) Leukocyte Esterace: negative   (Normal Range: Negative)

## 2010-10-03 NOTE — Assessment & Plan Note (Signed)
Summary: pain in foot//lch   Vital Signs:  Patient profile:   70 year old female Weight:      176 pounds O2 Sat:      90 % on Room air Pulse rate:   117 / minute BP sitting:   124 / 80  (left arm)  Vitals Entered By: Doristine Devoid (February 24, 2010 9:02 AM)  O2 Flow:  Room air CC: pain in R foot xtues. starts at heel hurts to walk on    History of Present Illness: 70 yo woman here today for pain in her R foot.  sxs started on Tuesday.  pain is in heel.  hurts to walk.  was wearing sandals when pain started.  skin now itching, had some redness of medial heel.  Current Medications (verified): 1)  Nexium 40 Mg Cpdr (Esomeprazole Magnesium) .... Once Daily As Needed 2)  Alprazolam 1 Mg Tabs (Alprazolam) .Marland Kitchen.. 1 By Mouth Qam; 2 By Mouth Qpm 3)  Tramadol Hcl 50 Mg Tabs (Tramadol Hcl) .... As Needed-- Hold 4)  Mvi W/fe 5)  Folic Acid 1 Mg Tabs (Folic Acid) .... Take One Tablet Daily 6)  Onetouch Ultra Test  Strp (Glucose Blood) .... Test 1-2 Times Daily 7)  Onetouch Lancets  Misc (Lancets) .... Test 1-2 Times Daily 8)  Vicodin 5-500 Mg Tabs (Hydrocodone-Acetaminophen) .Marland Kitchen.. 1 Tab By Mouth Q4-6 As Needed For Severe Pain 9)  Furosemide 20 Mg Tabs (Furosemide) .... Take 1 Tablet By Mouth Daily.  May Take 2nd Tab As Needed For Swelling 10)  Proair Hfa 108 (90 Base) Mcg/act Aers (Albuterol Sulfate) .... 2 Puffs Every 4 Hours As Needed 11)  Fenofibrate 160 Mg Tabs (Fenofibrate) .... Take One Tablet Daily 12)  Simvastatin 40 Mg Tabs (Simvastatin) .... Take One Tablet Daily 13)  Lotrel 5-20 Mg Caps (Amlodipine Besy-Benazepril Hcl) .Marland Kitchen.. 1 Tab By Mouth Daily 14)  Naproxen 500 Mg Tabs (Naproxen) .Marland Kitchen.. 1 Tab By Mouth Two Times A Day X5-7 Days and Then As Needed.  Take With Food.  Allergies (verified): 1)  ! Pcn 2)  ! Amoxicillin (Amoxicillin) 3)  ! Celexa 4)  ! Zonegran (Zonisamide) 5)  ! Neurontin 6)  ! * Zonismide  Review of Systems      See HPI  Physical Exam  General:   Well-developed,well-nourished,in no acute distress; alert,appropriate and cooperative throughout examination Msk:  + TTP over insertion of plantar fascia on R Pulses:  +2 DP/PT Extremities:  no C/C/E mild erythema of area inferior to medial malleolus on R foot.   Impression & Recommendations:  Problem # 1:  PLANTAR FASCIITIS, RIGHT (ICD-728.71) Assessment New pt's pain consistent w/ above.  start NSAIDs, ice, stretching.  stressed the importance of cushioned, supportive shoes.  area of erythema likely due to sandal strap.  OTC hydrocortisone as needed. Her updated medication list for this problem includes:    Naproxen 500 Mg Tabs (Naproxen) .Marland Kitchen... 1 tab by mouth two times a day x5-7 days and then as needed.  take with food.  Complete Medication List: 1)  Nexium 40 Mg Cpdr (Esomeprazole magnesium) .... Once daily as needed 2)  Alprazolam 1 Mg Tabs (Alprazolam) .Marland Kitchen.. 1 by mouth qam; 2 by mouth qpm 3)  Tramadol Hcl 50 Mg Tabs (Tramadol hcl) .... As needed-- hold 4)  Mvi W/fe  5)  Folic Acid 1 Mg Tabs (Folic acid) .... Take one tablet daily 6)  Onetouch Ultra Test Strp (Glucose blood) .... Test 1-2 times daily 7)  Onetouch Lancets  Misc (Lancets) .... Test 1-2 times daily 8)  Vicodin 5-500 Mg Tabs (Hydrocodone-acetaminophen) .Marland Kitchen.. 1 tab by mouth q4-6 as needed for severe pain 9)  Furosemide 20 Mg Tabs (Furosemide) .... Take 1 tablet by mouth daily.  may take 2nd tab as needed for swelling 10)  Proair Hfa 108 (90 Base) Mcg/act Aers (Albuterol sulfate) .... 2 puffs every 4 hours as needed 11)  Fenofibrate 160 Mg Tabs (Fenofibrate) .... Take one tablet daily 12)  Simvastatin 40 Mg Tabs (Simvastatin) .... Take one tablet daily 13)  Lotrel 5-20 Mg Caps (Amlodipine besy-benazepril hcl) .Marland Kitchen.. 1 tab by mouth daily 14)  Naproxen 500 Mg Tabs (Naproxen) .Marland Kitchen.. 1 tab by mouth two times a day x5-7 days and then as needed.  take with food.  Patient Instructions: 1)  Follow up as scheduled 2)  Ice the foot  over a water or soda bottle 2-3x/day 3)  Do some gentle foot stretching 4)  Take the Naproxen as directed for the inflammation- take with food to avoid upset stomach 5)  Use the hydrocortisone cream on your foot at the place of irritation 6)  Wear good supportive shoes 7)  Call with any questions or concerns 8)  Hang in there!! Prescriptions: NAPROXEN 500 MG TABS (NAPROXEN) 1 tab by mouth two times a day x5-7 days and then as needed.  take with food.  #60 x 1   Entered and Authorized by:   Neena Rhymes MD   Signed by:   Neena Rhymes MD on 02/24/2010   Method used:   Electronically to        [facility] [phone]* (retail)       [address]       [place], [place]  [address]       Ph: [phone]       Fax: [phone]   RxID:   [id number]

## 2010-10-03 NOTE — Assessment & Plan Note (Signed)
Summary: 6 MONTH OV//PH   Vital Signs:  Patient profile:   70 year old female Weight:      181 pounds Pulse rate:   72 / minute BP sitting:   108 / 78  (left arm)  Vitals Entered By: Doristine Devoid (December 05, 2009 9:45 AM) CC: 6 month roa     History of Present Illness: 70 yo woman here today for f/u on  1) HTN- BP excellently controlled.  taking Lotrel w/out difficulty.  No HAs, CP, N/V, SOB.  2) Hyperlipidemia- tolerating cholesterol meds w/out difficulty.  no myalgias, abd pain.  3) DM- recently dx'd.  told to work on diet/exercise, has lost 3 lbs.  readings for last month range 72-167 with most values in the low 100s.  fasting sugars are excellently controlled.  few recorded spikes occur after meals but those are rare.  has first nutrition class tonight.  4) Vaginal Itching- occurs at the introitus, no dysuria, frequency, urgency, hesitancy.  no discharge.  this is persistant problem for pt.  Preventive Screening-Counseling & Management  Alcohol-Tobacco     Smoking Status: current     Smoking Cessation Counseling: yes     Smoke Cessation Stage: precontemplative  Caffeine-Diet-Exercise     Diet Comments: low carb diet     Does Patient Exercise: yes     Type of exercise: physical therapy      Sexual History:  currently monogamous.    Problems Prior to Update: 1)  Back Pain, Chronic  (ICD-724.5) 2)  Diabetes Mellitus  (ICD-250.00) 3)  Smoker  (ICD-305.1) 4)  Uti  (ICD-599.0) 5)  Vaginitis  (ICD-616.10) 6)  Preventive Health Care  (ICD-V70.0) 7)  Screening For Malignant Neoplasm of The Cervix  (ICD-V76.2) 8)  Eustachian Tube Dysfunction  (ICD-381.81) 9)  Heart Murmur, Systolic  (ICD-785.2) 10)  Sinusitis- Acute-nos  (ICD-461.9) 11)  Hx of Diverticulitis of Colon  (ICD-562.11) 12)  Depressive Disorder  (ICD-311) 13)  Special Screening For Malignant Neoplasms Colon  (ICD-V76.51) 14)  Hypertension  (ICD-401.9) 15)  Hyperlipidemia  (ICD-272.4)  Current Medications  (verified): 1)  Lotrel 5-20 Mg Caps (Amlodipine Besy-Benazepril Hcl) .Marland Kitchen.. 1 By Mouth Once Daily 2)  Nexium 40 Mg Cpdr (Esomeprazole Magnesium) .... Once Daily As Needed 3)  Alprazolam 1 Mg Tabs (Alprazolam) .Marland Kitchen.. 1 By Mouth Qam; 2 By Mouth Qpm 4)  Tramadol Hcl 50 Mg Tabs (Tramadol Hcl) .... As Needed 5)  Mobic 7.5 Mg Tabs (Meloxicam) .... As Needed 6)  Simvastatin 40 Mg Tabs (Simvastatin) .Marland Kitchen.. 1 By Mouth Qhs 7)  Mvi W/fe 8)  Folic Acid 1 Mg Tabs (Folic Acid) .... Take One Tablet Daily 9)  Fenofibrate 160 Mg Tabs (Fenofibrate) .... Take One Tablet Daily 10)  One Touch Ultra Mini Test Strips 11)  Lancets  Allergies (verified): 1)  ! Pcn  Past History:  Family History: Last updated: 01/14/2009 DM - M HTN - M CAD - F MI age 68's stroke - no colon Ca - no breast Ca - no ovarian/uterine Ca - no cousins w/ TIA, MI  Social History: Last updated: 09/06/2008 Married, smoker- 1/2ppd. 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 Married, smoker- 1/2ppd. Alcohol Use - no Does Patient Exercise:  yes  Review of Systems  The patient denies  anorexia, fever, weight gain, vision loss, chest pain, syncope, dyspnea on exertion, peripheral edema, headaches, abdominal pain, and muscle weakness.    Physical Exam  General:  Well-developed,well-nourished,in no acute distress; alert,appropriate and cooperative throughout examination Head:  Normocephalic and atraumatic without obvious abnormalities. No apparent alopecia or balding. Neck:  No deformities, masses, or tenderness noted. Lungs:  Normal respiratory effort, chest expands symmetrically. Lungs are clear to auscultation, no crackles or wheezes. Heart:  Normal rate and  regular rhythm. S1 and S2 normal without gallop, murmur, click, rub or other extra sounds. Abdomen:  soft, NT/ND, +BS Genitalia:  vaginal skin thin and atrophic, no vaginal D/C.  area of itching slightly hyperpigmented Pulses:  +2 carotid, radial, DP Extremities:  No clubbing, cyanosis, edema, or deformity noted   Impression & Recommendations:  Problem # 1:  HYPERTENSION (ICD-401.9) Assessment Unchanged BP excellently controlled.  no changes.  check labs The following medications were removed from the medication list:    Maxzide-25 37.5-25 Mg Tabs (Triamterene-hctz) .Marland Kitchen... As needed. Her updated medication list for this problem includes:    Lotrel 5-20 Mg Caps (Amlodipine besy-benazepril hcl) .Marland Kitchen... 1 by mouth once daily  Orders: TLB-BMP (Basic Metabolic Panel-BMET) (80048-METABOL)  Problem # 2:  HYPERLIPIDEMIA (ICD-272.4) Assessment: Unchanged pt tolerating meds.  due for labs.  LDL goal now <70 given recent dx of DM. Her updated medication list for this problem includes:    Simvastatin 40 Mg Tabs (Simvastatin) .Marland Kitchen... 1 by mouth qhs    Fenofibrate 160 Mg Tabs (Fenofibrate) .Marland Kitchen... Take one tablet daily  Orders: Venipuncture (16109) TLB-Lipid Panel (80061-LIPID) TLB-Hepatic/Liver Function Pnl (80076-HEPATIC)  Problem # 3:  DIABETES MELLITUS (ICD-250.00) Assessment: Unchanged CBGs are excellent.  encouraged pt to keep up good work on diet and exercise.  will follow closely. Her updated medication list for this problem includes:    Lotrel 5-20 Mg Caps (Amlodipine besy-benazepril hcl) .Marland Kitchen... 1 by mouth once daily  Problem # 4:  VAGINITIS, ATROPHIC (ICD-627.3) Assessment: New pt's vaginal itching very unlikely to be yeast.  exam shows vaginal atrophy.  will start long acting moisturizer and if no relief will add premarin cream for symptomatic relief.  Pt expresses understanding and is in agreement w/ this plan.  Complete Medication List: 1)  Lotrel 5-20 Mg Caps (Amlodipine  besy-benazepril hcl) .Marland Kitchen.. 1 by mouth once daily 2)  Nexium 40 Mg Cpdr (Esomeprazole magnesium) .... Once daily as needed 3)  Alprazolam 1 Mg Tabs (Alprazolam) .Marland Kitchen.. 1 by mouth qam; 2 by mouth qpm 4)  Tramadol Hcl 50 Mg Tabs (Tramadol hcl) .... As needed 5)  Mobic 7.5 Mg Tabs (Meloxicam) .... As needed 6)  Simvastatin 40 Mg Tabs (Simvastatin) .Marland Kitchen.. 1 by mouth qhs 7)  Mvi W/fe  8)  Folic Acid 1 Mg Tabs (Folic acid) .... Take one tablet daily 9)  Fenofibrate 160 Mg Tabs (Fenofibrate) .... Take one tablet daily 10)  One Touch Ultra Mini Test Strips  11)  Lancets   Patient Instructions: 1)  Please schedule a follow-up appointment in 2 months to review sugars. 2)  Keep up the good work on diet and exercise 3)  We'll notify you of your lab results and make any changes to your cholesterol meds as needed 4)  Use Replens (long acting moisturizer) every 2-3 days to see if there is improvement in your itching 5)  If no improvement after 2-3 weeks, please call and we'll pursue the estrogen therapy 6)  Keep up the good work! 7)  Happy Odis Luster!

## 2010-10-03 NOTE — Miscellaneous (Signed)
Summary: HIPAA Restrictions  HIPAA Restrictions   Imported By: Florinda Marker 01/24/2010 14:02:14  _____________________________________________________________________  External Attachment:    Type:   Image     Comment:   External Document

## 2010-10-03 NOTE — Consult Note (Signed)
Summary: [facility]   Imported By: Lanelle Bal 11/24/2009 11:51:50  _____________________________________________________________________  External Attachment:    Type:   Image     Comment:   External Document

## 2010-10-03 NOTE — Progress Notes (Signed)
Summary: urine cx  Phone Note Outgoing Call   Call placed by: Doristine Devoid,  February 22, 2010 2:40 PM Call placed to: Patient Summary of Call: no indication of infxn- she can stop her abx.  Follow-up for Phone Call        patient aware.......Marland KitchenDoristine Devoid  February 22, 2010 2:41 PM

## 2010-10-03 NOTE — Assessment & Plan Note (Signed)
Summary: Cardiology Nuclear Study  Nuclear Med Background Indications for Stress Test: Evaluation for Ischemia, Post Hospital  Indications Comments: Discharged 01/02/10 Hepatic abscesses>septic shock, acute renal and respiratory failure with minimally elevated troponins and decreased EF.  History: COPD, Echo, Emphysema  History Comments: 01/31/10 Echo:EF=45-50%; h/o CHF,diastolic dysfunction/LVD.  Symptoms: Fatigue, Fatigue with Exertion    Nuclear Pre-Procedure Cardiac Risk Factors: Family History - CAD, Hypertension, Lipids, NIDDM, Obesity, Smoker Caffeine/Decaff Intake: None NPO After: 6:30 AM Lungs: Clear.  O2 Sat 96% on RA. IV 0.9% NS with Angio Cath: 22g     IV Site: (L) AC IV Started by: Irean Hong RN Chest Size (in) 44     Cup Size C     Height (in): 63.5 Weight (lb): 177 BMI: 30.97 Tech Comments: Xanax 1 mg taken at 10:45 am per patient. The IV was positional at time of Lexiscan, so changed to (R) hand per Thermon Leyland, EMT-P.   Nuclear Med Study 1 or 2 day study:  1 day     Stress Test Type:  Eugenie Birks Reading MD:  Arvilla Meres, MD     Referring MD:  Charlton Haws, MD Resting Radionuclide:  Technetium 35m Tetrofosmin     Resting Radionuclide Dose:  11 mCi  Stress Radionuclide:  Technetium 84m Tetrofosmin     Stress Radionuclide Dose:  33 mCi   Stress Protocol   Lexiscan: 0.4 mg   Stress Test Technologist:  Rea College CMA-N     Nuclear Technologist:  Domenic Polite CNMT  Rest Procedure  Myocardial perfusion imaging was performed at rest 45 minutes following the intravenous administration of Myoview Technetium 30m Tetrofosmin.  Stress Procedure  The patient received IV Lexiscan 0.4 mg over 15-seconds.  Myoview injected at 30-seconds.  There were no significant changes with lexiscan, only occasional PVC's and PAC's.  She did c/o chest pressure.  Quantitative spect images were obtained after a 45 minute delay.  QPS Raw Data Images:  Normal; no motion  artifact; normal heart/lung ratio. Stress Images:  There is normal uptake in all areas. Rest Images:  Normal homogeneous uptake in all areas of the myocardium. Subtraction (SDS):  Normal Transient Ischemic Dilatation:  1.03  (Normal <1.22)  Lung/Heart Ratio:  .37  (Normal <0.45)  Quantitative Gated Spect Images QGS EDV:  73 ml QGS ESV:  33 ml QGS EF:  55 % QGS cine images:  Septal hypokinesis  Findings Low risk nuclear study      Overall Impression  Exercise Capacity: Lexiscan study with no exercise. ECG Impression: Baseline: NSR; No significant ST segment change with Lexiscan. Overall Impression: Low risk stress nuclear study. Overall Impression Comments: Normal perfusion. Septal hypokinesis.   Appended Document: Cardiology Nuclear Study no evidence of CAD.  F/U EF with MRI in 6 months  Appended Document: Cardiology Nuclear Study pt aware of results, pt does not want to do an MRI. she is extremely clostiphobic.

## 2010-10-03 NOTE — Assessment & Plan Note (Signed)
Summary: urinary tract infection/kdc   Vital Signs:  Patient profile:   70 year old female Weight:      185.13 pounds BMI:     32.40 Temp:     98.3 degrees F oral Pulse rate:   84 / minute Pulse rhythm:   regular Resp:     16 per minute BP sitting:   120 / 80  (left arm) Cuff size:   large  Vitals Entered By: Mervin Kung CMA (November 07, 2009 9:10 AM) CC: room 14   Pelvic pain x 1 day with slight vaginal itching.   Primary Care Provider:  Neena Rhymes, MD  CC:  room 14   Pelvic pain x 1 day with slight vaginal itching.Marland Kitchen  History of Present Illness: Sarah Mcguire is a 70 year old female who presents today with c/o of 1 day history of suprapubic pain and external itching.  Notes that this itching is "not as deep as a yeast infection." Discomfort is "just inside."  Notes that these symptoms started yesterday.  Denies fever, or dysuria, +frequency.  Tells me that she has been getting steroid injections in her back.  Tends to get yeast infections after she has these injections.    Allergies: 1)  ! Pcn  Physical Exam  General:  Well-developed,well-nourished,in no acute distress; alert,appropriate and cooperative throughout examination Lungs:  Normal respiratory effort, chest expands symmetrically. Lungs are clear to auscultation, no crackles or wheezes. Heart:  Normal rate and regular rhythm. S1 and S2 normal without gallop, murmur, click, rub or other extra sounds. Abdomen:  Bowel sounds positive,abdomen soft and non-tender without masses, organomegaly or hernias noted. Genitalia:  Patient refuses pelvic exam   Impression & Recommendations:  Problem # 1:  UTI (ICD-599.0) Assessment New Plan to treat with Cipro.  Will send urine sample for culture.   The following medications were removed from the medication list:    Doxycycline Hyclate 100 Mg Caps (Doxycycline hyclate) .Marland Kitchen... 1 two times a day x 5 days with food ,then 1 once daily Her updated medication list for this problem  includes:    Cipro 250 Mg Tabs (Ciprofloxacin hcl) ..... One tablet by mouth two times a day x 3 days  Orders: UA Dipstick w/o Micro (manual) (25956) Specimen Handling (99000) T-Culture, Urine ([id number])  Problem # 2:  HYPERGLYCEMIA, MILD (ICD-790.29) Assessment: New Last documented sugar was 117.  I am concerned that she may be having some worsening hyperglycemia following Epidural steroid injections which may be exacerbating episodes of vaginal candidiasis and UTI. She is requesting script for diflucan.   Will check A1C.   Orders: Venipuncture (51884) TLB-A1C / Hgb A1C (Glycohemoglobin) (83036-A1C)  Complete Medication List: 1)  Lotrel 5-20 Mg Caps (Amlodipine besy-benazepril hcl) .Marland Kitchen.. 1 by mouth once daily 2)  Nexium 40 Mg Cpdr (Esomeprazole magnesium) .... Once daily as needed 3)  Alprazolam 1 Mg Tabs (Alprazolam) .Marland Kitchen.. 1 by mouth qam; 2 by mouth qpm 4)  Maxzide-25 37.5-25 Mg Tabs (Triamterene-hctz) .... As needed. 5)  Tramadol Hcl 50 Mg Tabs (Tramadol hcl) .... As needed 6)  Mobic 7.5 Mg Tabs (Meloxicam) .... As needed 7)  Simvastatin 40 Mg Tabs (Simvastatin) .Marland Kitchen.. 1 by mouth qhs 8)  Mvi W/fe  9)  Folic Acid 1mg   .... 1 by mouth once daily 10)  Fenofibrate 160 Mg Tabs (Fenofibrate) .... Take one tablet daily 11)  Cipro 250 Mg Tabs (Ciprofloxacin hcl) .... One tablet by mouth two times a day x 3 days 12)  Fluconazole 150 Mg Tabs (Fluconazole) .... One tablet by mouth x1 as needed vaginal itching or discharge  Patient Instructions: 1)  Call if fever, if worsening low back pain, if your symptoms worsen or if they do not improve.   Prescriptions: FLUCONAZOLE 150 MG TABS (FLUCONAZOLE) one tablet by mouth x1 as needed vaginal itching or discharge  #1 x 0   Entered and Authorized by:   Lemont Fillers FNP   Signed by:   Lemont Fillers FNP on 11/07/2009   Method used:   Electronically to        [facility] [phone]* (retail)       [address]        [place], [place]  [address]       Ph: [phone]       Fax: [phone]   RxID:   [id number] CIPRO 250 MG TABS (CIPROFLOXACIN HCL) one tablet by mouth two times a day x 3 days  #6 x 0   Entered and Authorized by:   Lemont Fillers FNP   Signed by:   Lemont Fillers FNP on 11/07/2009   Method used:   Electronically to        [facility] [phone]* (retail)       [address]       [place], [place]  [address]       Ph: [phone]       Fax: [phone]   RxID:   [id number]   Current Allergies (reviewed today): ! PCN Laboratory Results   Urine Tests    Routine Urinalysis   Color: straw Appearance: Clear Glucose: negative   (Normal Range: Negative) Bilirubin: negative   (Normal Range: Negative) Ketone: negative   (Normal Range: Negative) Spec. Gravity: 1.010   (Normal Range: 1.003-1.035) Blood: negative   (Normal Range: Negative) pH: 6.0   (Normal Range: 5.0-8.0) Protein: negative   (Normal Range: Negative) Urobilinogen: 0.2   (Normal Range: 0-1) Nitrite: negative   (Normal Range: Negative) Leukocyte Esterace: moderate   (Normal Range: Negative)

## 2010-10-03 NOTE — Progress Notes (Signed)
Summary: labs  Phone Note Outgoing Call   Call placed by: Doristine Devoid CMA,  June 14, 2010 9:45 AM Call placed to: Patient Summary of Call: A1C has climbed from 5.8 --> 7.4!  this is not at goal.  needs to start Metformin 500mg  two times a day.  please make her aware that this can initially cause diarrhea but should improve after 1-2 weeks.  remainder of labs look good.   Follow-up for Phone Call        spoke w/ patient is hesitant about taking medication says that when she checks fasting blood sugars at home they haven't been over 80's so would like for me to discuss w/ Dr. Beverely Low before starting medication.........Marland KitchenDoristine Devoid CMA  June 14, 2010 9:53 AM   patient has office visit scheduled for tomorrow to f/u .....Marland KitchenMarland KitchenDoristine Devoid CMA  June 14, 2010 4:42 PM

## 2010-10-03 NOTE — Progress Notes (Signed)
Summary: alprazolam refill  Phone Note Refill Request Call back at [phone] Message from:  Pharmacy on Jan 03, 2010 11:26 AM  Refills Requested: Medication #1:  ALPRAZOLAM 1 MG TABS 1 by mouth qam; 2 by mouth qpm   Dosage confirmed as above?Dosage Confirmed   Supply Requested: 3 months [facility] on [address]  Next Appointment Scheduled: 5.9.11 Initial call taken by: Harold Barban,  Jan 03, 2010 11:26 AM    Prescriptions: ALPRAZOLAM 1 MG TABS (ALPRAZOLAM) 1 by mouth qam; 2 by mouth qpm  #90 x 1   Entered by:   Doristine Devoid   Authorized by:   Neena Rhymes MD   Signed by:   Doristine Devoid on 01/04/2010   Method used:   Printed then faxed to ...       [facility]  [address] [phone]* (retail)       [address]       [place], [place]  [address]       Ph: [phone]       Fax: [phone]   RxID:   [id number]

## 2010-10-03 NOTE — Progress Notes (Signed)
Summary: referral  Phone Note Call from Patient Call back at Home Phone [phone]   Caller: Spouse Summary of Call: patient was told referral would be faxed to [facility] - for hip - she was getting therepy & referral ran out - their fax #[phone]    - call husband when faxed Initial call taken by: Okey Regal [place],  February 27, 2010 2:00 PM  New Problems: HIP PAIN (ICD-719.45)   New Problems: HIP PAIN (ICD-719.45)  Appended Document: referral done

## 2010-10-03 NOTE — Progress Notes (Signed)
Summary: RESULTS  Phone Note Call from Patient Call back at Home Phone [phone]   Caller: Spouse HUSBAND Reason for Call: Talk to Nurse, Lab or Test Results Summary of Call: STRESS TEST WOULD LIKE A CALL ASAP VERY WORRIED Initial call taken by: Glynda Jaeger,  February 08, 2010 10:44 AM  Follow-up for Phone Call        02/08/10--1245pm-pt given results of lexiscan--nt Follow-up by: Ledon Snare, RN,  February 08, 2010 12:45 PM

## 2010-10-03 NOTE — Medication Information (Signed)
Summary: [facility]: Medication  [facility]: Medication   Imported By: Florinda Marker 01/12/2010 11:39:16  _____________________________________________________________________  External Attachment:    Type:   Image     Comment:   External Document

## 2010-10-03 NOTE — Assessment & Plan Note (Signed)
Summary: TO DISCUSS HER DIABETIC   Vital Signs:  Patient profile:   70 year old female Weight:      175 pounds Pulse rate:   82 / minute BP sitting:   124 / 80  (left arm)  Vitals Entered By: Doristine Devoid CMA (June 15, 2010 4:07 PM) CC: Discuss labs also has been taking alli and co q 10 read that they could raise BS also had steroid injection    History of Present Illness: 70 yo woman here today for discussion on diabetes.  recent A1C 7.4.  not on meds previously but A1C has always been much lower.  reports that she had 2 steroid injxns in 1 month, coupled w/ recent vacation.  fasting CBGs rarely >90.  pt fears starting meds due to concerns over symptomatic lows.  Current Medications (verified): 1)  Nexium 40 Mg Cpdr (Esomeprazole Magnesium) .... Once Daily As Needed 2)  Alprazolam 1 Mg Tabs (Alprazolam) .Marland Kitchen.. 1 By Mouth Qam; 2 By Mouth Qpm 3)  Qc Daily Multivitamins/iron  Tabs (Multiple Vitamins-Iron) .... Take One Tablet Daily 4)  Folic Acid 1 Mg Tabs (Folic Acid) .... Take One Tablet Daily 5)  Onetouch Ultra Blue  Strp (Glucose Blood) .... Test 1-2 Times Daily As Directed 6)  Onetouch Lancets  Misc (Lancets) .... Test 1-2 Times Daily 7)  Vicodin 5-500 Mg Tabs (Hydrocodone-Acetaminophen) .Marland Kitchen.. 1 Tab By Mouth Q4-6 As Needed For Severe Pain 8)  Furosemide 20 Mg Tabs (Furosemide) .... Take 1 Tablet By Mouth Daily.  May Take 2nd Tab As Needed For Swelling 9)  Proair Hfa 108 (90 Base) Mcg/act Aers (Albuterol Sulfate) .... 2 Puffs Every 4 Hours As Needed 10)  Fenofibrate 160 Mg Tabs (Fenofibrate) .... Take One Tablet Daily 11)  Simvastatin 40 Mg Tabs (Simvastatin) .... Take One Tablet Daily 12)  Lotrel 5-20 Mg Caps (Amlodipine Besy-Benazepril Hcl) .Marland Kitchen.. 1 Tab By Mouth Daily 13)  Diflucan 150 Mg Tabs (Fluconazole) .... Once Daily.  May Repeat in 3 Days If Sxs Persist 14)  Calcium 750mg  .... Take One Tablet Daily  Allergies (verified): 1)  ! Pcn 2)  ! Amoxicillin (Amoxicillin) 3)  !  Celexa 4)  ! Zonegran (Zonisamide) 5)  ! Neurontin 6)  ! * Zonismide  Review of Systems      See HPI  Physical Exam  General:  Well-developed,well-nourished,in no acute distress; alert,appropriate and cooperative throughout examination   Impression & Recommendations:  Problem # 1:  DIABETES MELLITUS (ICD-250.00) Assessment Deteriorated A1C is now 7.4.  recommended pt start metformin.  pt very hesitant to do so.  feels there are reasons that her A1C is now higher (steroid injxns)- will monitor CBGs for 1 month, paying particular attention to post prandials b/c pt's fasting values are normal.  reviewed that insulin resistance has 2 components- fasting and postprandial.  will follow closely. Her updated medication list for this problem includes:    Lotrel 5-20 Mg Caps (Amlodipine besy-benazepril hcl) .Marland Kitchen... 1 tab by mouth daily  Complete Medication List: 1)  Nexium 40 Mg Cpdr (Esomeprazole magnesium) .... Once daily as needed 2)  Alprazolam 1 Mg Tabs (Alprazolam) .Marland Kitchen.. 1 by mouth qam; 2 by mouth qpm 3)  Qc Daily Multivitamins/iron Tabs (Multiple vitamins-iron) .... Take one tablet daily 4)  Folic Acid 1 Mg Tabs (Folic acid) .... Take one tablet daily 5)  Onetouch Ultra Blue Strp (Glucose blood) .... Test 1-2 times daily as directed 6)  Onetouch Lancets Misc (Lancets) .... Test 1-2 times daily  7)  Vicodin 5-500 Mg Tabs (Hydrocodone-acetaminophen) .Marland Kitchen.. 1 tab by mouth q4-6 as needed for severe pain 8)  Furosemide 20 Mg Tabs (Furosemide) .... Take 1 tablet by mouth daily.  may take 2nd tab as needed for swelling 9)  Proair Hfa 108 (90 Base) Mcg/act Aers (Albuterol sulfate) .... 2 puffs every 4 hours as needed 10)  Fenofibrate 160 Mg Tabs (Fenofibrate) .... Take one tablet daily 11)  Simvastatin 40 Mg Tabs (Simvastatin) .... Take one tablet daily 12)  Lotrel 5-20 Mg Caps (Amlodipine besy-benazepril hcl) .Marland Kitchen.. 1 tab by mouth daily 13)  Diflucan 150 Mg Tabs (Fluconazole) .... Once daily.  may  repeat in 3 days if sxs persist 14)  Calcium 750mg   .... Take one tablet daily  Patient Instructions: 1)  Follow up in 1 month to recheck sugars 2)  Check your sugar first thing in the morning and again 2 hrs after eating either lunch or dinner 3)  If your after eating sugars are <150, we should be good 4)  If they are consistently higher than 150, call me 5)  We'll call you about your urine 6)  Hang in there!! Prescriptions: ONETOUCH ULTRA BLUE  STRP (GLUCOSE BLOOD) test 1-2 times daily as directed  #100 x 3   Entered and Authorized by:   Neena Rhymes MD   Signed by:   Neena Rhymes MD on 06/15/2010   Method used:   Electronically to        [facility] [phone]* (retail)       [address]       [place], [place]  [address]       Ph: [phone]       Fax: [phone]   RxID:   [id number] DIFLUCAN 150 MG TABS (FLUCONAZOLE) once daily.  may repeat in 3 days if sxs persist  #2 x 0   Entered and Authorized by:   Neena Rhymes MD   Signed by:   Neena Rhymes MD on 06/15/2010   Method used:   Electronically to        [facility] [phone]* (retail)       [address]       [place], [place]  [address]       Ph: [phone]       Fax: [phone]   RxID:   [id number]

## 2010-10-03 NOTE — Progress Notes (Signed)
Summary: alprazolam refill   Phone Note Refill Request Message from:  Fax from Pharmacy on March 02, 2010 2:36 PM  Refills Requested: Medication #1:  ALPRAZOLAM 1 MG TABS 1 by mouth qam; 2 by mouth qpm [address] - fax [phone] - ph  [phone]  Initial call taken by: Okey Regal [place],  March 02, 2010 2:37 PM    Prescriptions: ALPRAZOLAM 1 MG TABS (ALPRAZOLAM) 1 by mouth qam; 2 by mouth qpm  #90 x 1   Entered by:   Doristine Devoid   Authorized by:   Neena Rhymes MD   Signed by:   Doristine Devoid on 03/02/2010   Method used:   Printed then faxed to ...       [facility]  [address] [phone]* (retail)       [address]       [place], [place]  [address]       Ph: [phone]       Fax: [phone]   RxID:   [id number]

## 2010-10-03 NOTE — Medication Information (Signed)
Summary: [facility]: RX  [facility]: RX   Imported By: Florinda Marker 02/08/2010 15:08:50  _____________________________________________________________________  External Attachment:    Type:   Image     Comment:   External Document

## 2010-10-03 NOTE — Assessment & Plan Note (Signed)
Summary: possible yeast infection//kn   Vital Signs:  Patient profile:   70 year old female Weight:      176 pounds Pulse rate:   105 / minute BP sitting:   116 / 74  (left arm)  Vitals Entered By: Doristine Devoid CMA (March 28, 2010 11:29 AM) CC: vaginal itching    History of Present Illness: 70 yo woman here today for vaginal itching.  sxs started 'a couple of weeks ago'.  has been using monistat w/out relief.  no vaginal discharge.  no dysuria, frequency, urgency, abd pain, back pain.  recently had epidural injxn which often gives pt yeast.  Allergies (verified): 1)  ! Pcn 2)  ! Amoxicillin (Amoxicillin) 3)  ! Celexa 4)  ! Zonegran (Zonisamide) 5)  ! Neurontin 6)  ! * Zonismide  Review of Systems      See HPI  Physical Exam  General:  Well-developed,well-nourished,in no acute distress; alert,appropriate and cooperative throughout examination Genitalia:  vaginal skin thin and atrophic, no vaginal D/C.    Impression & Recommendations:  Problem # 1:  UTI (ICD-599.0) Assessment Unchanged given pt's recent hx of sepsis check UA- consistent w/ infxn given large leuks and trace blood.  start Cipro.  await cx results.  if + will need test of cure. Her updated medication list for this problem includes:    Cipro 500 Mg Tabs (Ciprofloxacin hcl) .Marland Kitchen... 1 by mouth 2 times daily  Orders: Specimen Handling (91478) T-Culture, Urine ([id number]) UA Dipstick w/o Micro (manual) (81002)  Problem # 2:  VAGINITIS (ICD-616.10) Assessment: Unchanged  no yeast seen on wet prep.  diflucan given b/c pt typically has yeast w/ abx.  reviewed that itching may be related to her atrophic vaginitis.  pt to call if sxs persist after tx w/ diflucan.  if so, will need GYN referral. Her updated medication list for this problem includes:    Cipro 500 Mg Tabs (Ciprofloxacin hcl) .Marland Kitchen... 1 by mouth 2 times daily  Orders: Wet Prep (57846NG)  Complete Medication List: 1)  Nexium 40 Mg Cpdr  (Esomeprazole magnesium) .... Once daily as needed 2)  Alprazolam 1 Mg Tabs (Alprazolam) .Marland Kitchen.. 1 by mouth qam; 2 by mouth qpm 3)  Tramadol Hcl 50 Mg Tabs (Tramadol hcl) .... As needed-- hold 4)  Mvi W/fe  5)  Folic Acid 1 Mg Tabs (Folic acid) .... Take one tablet daily 6)  Onetouch Ultra Test Strp (Glucose blood) .... Test 1-2 times daily 7)  Onetouch Lancets Misc (Lancets) .... Test 1-2 times daily 8)  Vicodin 5-500 Mg Tabs (Hydrocodone-acetaminophen) .Marland Kitchen.. 1 tab by mouth q4-6 as needed for severe pain 9)  Furosemide 20 Mg Tabs (Furosemide) .... Take 1 tablet by mouth daily.  may take 2nd tab as needed for swelling 10)  Proair Hfa 108 (90 Base) Mcg/act Aers (Albuterol sulfate) .... 2 puffs every 4 hours as needed 11)  Fenofibrate 160 Mg Tabs (Fenofibrate) .... Take one tablet daily 12)  Simvastatin 40 Mg Tabs (Simvastatin) .... Take one tablet daily 13)  Lotrel 5-20 Mg Caps (Amlodipine besy-benazepril hcl) .Marland Kitchen.. 1 tab by mouth daily 14)  Naproxen 500 Mg Tabs (Naproxen) .Marland Kitchen.. 1 tab by mouth two times a day x5-7 days and then as needed.  take with food. 15)  Cipro 500 Mg Tabs (Ciprofloxacin hcl) .Marland Kitchen.. 1 by mouth 2 times daily 16)  Diflucan 150 Mg Tabs (Fluconazole) .... Once daily.  may repeat in 3 days if sxs persist  Patient Instructions: 1)  If  no improvement in symptoms please call 2)  Start the Cipro for possible UTI- we'll culture your urine and let you know 3)  Take the Diflucan for yeast symptoms 4)  Hang in there! Prescriptions: DIFLUCAN 150 MG TABS (FLUCONAZOLE) once daily.  may repeat in 3 days if sxs persist  #2 x 0   Entered and Authorized by:   Neena Rhymes MD   Signed by:   Neena Rhymes MD on 03/28/2010   Method used:   Print then Give to Patient   RxID:   [id number] CIPRO 500 MG TABS (CIPROFLOXACIN HCL) 1 by mouth 2 times daily  #6 x 0   Entered and Authorized by:   Neena Rhymes MD   Signed by:   Neena Rhymes MD on 03/28/2010   Method used:   Print then  Give to Patient   RxID:   [id number]   Laboratory Results   Urine Tests    Routine Urinalysis   Glucose: negative   (Normal Range: Negative) Bilirubin: negative   (Normal Range: Negative) Ketone: negative   (Normal Range: Negative) Spec. Gravity: 1.015   (Normal Range: 1.003-1.035) Blood: negative   (Normal Range: Negative) pH: 6.0   (Normal Range: 5.0-8.0) Protein: negative   (Normal Range: Negative) Urobilinogen: 0.2   (Normal Range: 0-1) Nitrite: negative   (Normal Range: Negative) Leukocyte Esterace: large   (Normal Range: Negative)

## 2010-10-03 NOTE — Miscellaneous (Signed)
Summary: diabetic foot exam  Clinical Lists Changes  Observations: Added new observation of PODIATRIST: Dr. Enrique Sack (11/20/2009 16:46) Added new observation of DIABFOOTDPM: early diabetic findings (11/20/2009 16:46)        Diabetes Management Exam:    Foot Exam by Podiatrist:       Date: 11/20/2009       Results: early diabetic findings       Done by: Dr. Enrique Sack

## 2010-10-03 NOTE — Miscellaneous (Signed)
Summary: [facility] Care  [facility]   Imported By: Florinda Marker 01/17/2010 14:27:57  _____________________________________________________________________  External Attachment:    Type:   Image     Comment:   External Document

## 2010-10-03 NOTE — Progress Notes (Signed)
Summary: still w/ uti sx  Phone Note Call from Patient Call back at Home Phone [phone]   Caller: Patient Summary of Call: patient left msg on voicemail stating that she feels better but doesn't think that uti is completeley gone and would like to know if she could get some more medication called in or does she need an office visit. Initial call taken by: Doristine Devoid,  November 14, 2009 9:06 AM  Follow-up for Phone Call        pls ask pt to follow up as I will need to reculture her urine.  Thanks Follow-up by: Lemont Fillers FNP,  November 14, 2009 9:19 AM  Additional Follow-up for Phone Call Additional follow up Details #1::        spoke w/ patient has appt scheduled to recollect urine sample ...Marland KitchenMarland KitchenDoristine Devoid  November 14, 2009 1:45 PM

## 2010-10-03 NOTE — Progress Notes (Signed)
Summary: PT APPROVED FOR CT SCAN TOMORROW  Phone Note Outgoing Call   Call placed by: Acey Lav MD,  Jan 19, 2010 5:48 PM Details for Reason: authorization for CT sca Summary of Call: I spoke with Dr. Dorena Cookey re Mrs Cariker. Her  case number is [id number]. Dr. Dorena Cookey gave authorization good for 30 days and authorization code is [id number]. She is approved for CT scan tomorrow Initial call taken by: Acey Lav MD,  Jan 19, 2010 5:50 PM

## 2010-10-03 NOTE — Progress Notes (Signed)
Summary: abx and ct scheduled  Phone Note Other Incoming   Summary of Call: Patient has a ct scheduled for 01/20/2010 for abdomen and pelvis and abx are continued thru 01/25/2010 Initial call taken by: Starleen Arms CMA,  Jan 16, 2010 12:21 PM  Follow-up for Phone Call        Perfect! Follow-up by: Acey Lav MD,  Jan 16, 2010 12:55 PM

## 2010-10-03 NOTE — Progress Notes (Signed)
Summary: alprazolam refill   Phone Note Refill Request Message from:  Fax from Pharmacy on October 31, 2009 1:56 PM  Refills Requested: Medication #1:  ALPRAZOLAM 1 MG TABS 1 by mouth qam; 2 by mouth qpm [facility] [phone]   Method Requested: Fax to Local Pharmacy Next Appointment Scheduled: 12/05/2009 Initial call taken by: Barb Merino,  October 31, 2009 1:57 PM    Prescriptions: ALPRAZOLAM 1 MG TABS (ALPRAZOLAM) 1 by mouth qam; 2 by mouth qpm  #90 x 0   Entered by:   Doristine Devoid   Authorized by:   Neena Rhymes MD   Signed by:   Doristine Devoid on 11/02/2009   Method used:   Printed then faxed to ...       [facility]  [address] [phone]* (retail)       [address]       [place], [place]  [address]       Ph: [phone]       Fax: [phone]   RxID:   [id number]

## 2010-10-03 NOTE — Assessment & Plan Note (Signed)
Summary: rto 1 month/cbs   Vital Signs:  Patient profile:   70 year old female Weight:      176 pounds Pulse rate:   104 / minute BP sitting:   118 / 80  (left arm)  Vitals Entered By: Doristine Devoid CMA (July 12, 2010 9:26 AM) CC: f/u on blood sugars and still w/ vaginal itching    History of Present Illness: 70 yo woman here today for  1) DM- CBGs ranging from 67- 141.  only 4 readings >130 out of 52.  denies dizziness or any symptomatic lows.  2) yeast- reports both internal and external itching.  no vaginal dc.  husband recently w/ yeast infxn.  using monistat w/out relief.  Allergies (verified): 1)  ! Pcn 2)  ! Amoxicillin (Amoxicillin) 3)  ! Celexa 4)  ! Zonegran (Zonisamide) 5)  ! Neurontin 6)  ! * Zonismide  Review of Systems      See HPI  Physical Exam  General:  Well-developed,well-nourished,in no acute distress; alert,appropriate and cooperative throughout examination Lungs:  Normal respiratory effort, chest expands symmetrically. Lungs are clear to auscultation, no crackles or wheezes. Heart:  Normal rate and regular rhythm. S1 and S2 normal without gallop, murmur, click, rub or other extra sounds. Genitalia:  labia majora and mons w/ red rash consistent w/ yeast.  vaginal skin thin and atrophic, scant vaginal D/C.    Impression & Recommendations:  Problem # 1:  DIABETES MELLITUS (ICD-250.00) Assessment Unchanged given pt's CBGs will not start meds at this time.  applauded pt's efforts on diet and exercise.  will continue to follow closely. Her updated medication list for this problem includes:    Lotrel 5-20 Mg Caps (Amlodipine besy-benazepril hcl) .Marland Kitchen... 1 tab by mouth daily  Problem # 2:  VAGINITIS (ICD-616.10) Assessment: Unchanged pt appears to have external yeast.  given persistance of problem will start oral meds.  wet prep pending. Orders: Specimen Handling ([id number]) T-Wet Prep ([id number])  Complete Medication List: 1)  Nexium 40 Mg Cpdr  (Esomeprazole magnesium) .... Once daily as needed 2)  Alprazolam 1 Mg Tabs (Alprazolam) .Marland Kitchen.. 1 by mouth qam; 2 by mouth qpm 3)  Qc Daily Multivitamins/iron Tabs (Multiple vitamins-iron) .... Take one tablet daily 4)  Folic Acid 1 Mg Tabs (Folic acid) .... Take one tablet daily 5)  Onetouch Ultra Blue Strp (Glucose blood) .... Test 1-2 times daily as directed 6)  Onetouch Lancets Misc (Lancets) .... Test 1-2 times daily 7)  Vicodin 5-500 Mg Tabs (Hydrocodone-acetaminophen) .Marland Kitchen.. 1 tab by mouth q4-6 as needed for severe pain 8)  Furosemide 20 Mg Tabs (Furosemide) .... Take 1 tablet by mouth daily.  may take 2nd tab as needed for swelling 9)  Proair Hfa 108 (90 Base) Mcg/act Aers (Albuterol sulfate) .... 2 puffs every 4 hours as needed 10)  Fenofibrate 160 Mg Tabs (Fenofibrate) .... Take one tablet daily 11)  Simvastatin 40 Mg Tabs (Simvastatin) .... Take one tablet daily 12)  Lotrel 5-20 Mg Caps (Amlodipine besy-benazepril hcl) .Marland Kitchen.. 1 tab by mouth daily 13)  Diflucan 150 Mg Tabs (Fluconazole) .... Once daily.  may repeat in 3 days if sxs persist 14)  Calcium 750mg   .... Take one tablet daily 15)  Diflucan 100 Mg Tabs (Fluconazole) .Marland Kitchen.. 1 tab daily x7 days  Patient Instructions: 1)  Follow up as scheduled in January 2)  Take the Diflucan daily for 1 week to help w/ the yeast.  Use over the counter Miconazole cream on the  skin 3)  STOP the cholesterol medicine (Simvastatin) for the week you take the Diflucan 4)  Your sugars look great!  Keep up the good work! 5)  Call with any questions or concerns 6)  Have a wonderful holiday season!! Prescriptions: DIFLUCAN 100 MG TABS (FLUCONAZOLE) 1 tab daily x7 days  #7 x 0   Entered and Authorized by:   Neena Rhymes MD   Signed by:   Neena Rhymes MD on 07/12/2010   Method used:   Print then Give to Patient   RxID:   [id number]    Orders Added: 1)  Specimen Handling [[id number]] 2)  T-Wet Prep [[id number]] 3)  Est. Patient Level III  [75643]

## 2010-10-03 NOTE — Assessment & Plan Note (Signed)
Summary: going on vacation/wants checked out ,not cpx/cbs   Vital Signs:  Patient profile:   70 year old female Weight:      177.8 pounds Pulse rate:   101 / minute BP sitting:   118 / 78  (left arm)  Vitals Entered By: Doristine Devoid CMA (May 05, 2010 9:52 AM) CC: roa   History of Present Illness: 70 yo woman here today for 'check up'.  going on vacation to [facility]- wants 'checked out' before they go.  1) UTI- pt would like urine recheck given recent infxn.  asymptomatic.  no dysuria, frequency, urgency.  2) Wt Loss- would like to know my thoughts on Alli.  3) Edema- now wearing support hose.  this is 3rd day- no swelling.  pt wants to know what to do about her lasix- continue or stop.  Current Medications (verified): 1)  Nexium 40 Mg Cpdr (Esomeprazole Magnesium) .... Once Daily As Needed 2)  Alprazolam 1 Mg Tabs (Alprazolam) .Marland Kitchen.. 1 By Mouth Qam; 2 By Mouth Qpm 3)  Mvi W/fe 4)  Folic Acid 1 Mg Tabs (Folic Acid) .... Take One Tablet Daily 5)  Onetouch Ultra Test  Strp (Glucose Blood) .... Test 1-2 Times Daily 6)  Onetouch Lancets  Misc (Lancets) .... Test 1-2 Times Daily 7)  Vicodin 5-500 Mg Tabs (Hydrocodone-Acetaminophen) .Marland Kitchen.. 1 Tab By Mouth Q4-6 As Needed For Severe Pain 8)  Furosemide 20 Mg Tabs (Furosemide) .... Take 1 Tablet By Mouth Daily.  May Take 2nd Tab As Needed For Swelling 9)  Proair Hfa 108 (90 Base) Mcg/act Aers (Albuterol Sulfate) .... 2 Puffs Every 4 Hours As Needed 10)  Fenofibrate 160 Mg Tabs (Fenofibrate) .... Take One Tablet Daily 11)  Simvastatin 40 Mg Tabs (Simvastatin) .... Take One Tablet Daily 12)  Lotrel 5-20 Mg Caps (Amlodipine Besy-Benazepril Hcl) .Marland Kitchen.. 1 Tab By Mouth Daily 13)  Naproxen 500 Mg Tabs (Naproxen) .Marland Kitchen.. 1 Tab By Mouth Two Times A Day X5-7 Days and Then As Needed.  Take With Food. 14)  Diflucan 150 Mg Tabs (Fluconazole) .... Once Daily.  May Repeat in 3 Days If Sxs Persist  Allergies (verified): 1)  ! Pcn 2)  ! Amoxicillin  (Amoxicillin) 3)  ! Celexa 4)  ! Zonegran (Zonisamide) 5)  ! Neurontin 6)  ! * Zonismide  Review of Systems      See HPI  Physical Exam  General:  Well-developed,well-nourished,in no acute distress; alert,appropriate and cooperative throughout examination Neck:  No deformities, masses, or tenderness noted. Lungs:  Normal respiratory effort, chest expands symmetrically. Lungs are clear to auscultation, no crackles or wheezes. Heart:  Normal rate and regular rhythm. S1 and S2 normal without gallop, murmur, click, rub or other extra sounds. Abdomen:  soft, NT/ND, +BS Pulses:  +2 radial, DP/PT Extremities:  trace pedal edema bilaterally Neurologic:  alert & oriented X3, cranial nerves II-XII intact, and gait normal.   Psych:  Oriented X3, memory intact for recent and remote, normally interactive, good eye contact, not anxious appearing, and not depressed appearing.     Impression & Recommendations:  Problem # 1:  UTI (ICD-599.0) Assessment Unchanged  pt asymptomatic.  UA normal.  no further w/u needed  Orders: UA Dipstick w/o Micro (manual) ([address]109)  Problem # 2:  EDEMA- LOCALIZED (ICD-782.3) Assessment: Unchanged pt to stop lasix daily and take as needed.  pt pleased w/ results from support hose.  will follow. Her updated medication list for this problem includes:    Furosemide 20  Mg Tabs (Furosemide) .Marland Kitchen... Take 1 tablet by mouth daily.  may take 2nd tab as needed for swelling  Problem # 3:  OBESITY (ICD-278.00) Assessment: New reviewed Alli, it's mechanism of action and potential side effects.  told pt she is free to start but recommended she not start this until she is home from vacation.  pt agreed.  Complete Medication List: 1)  Nexium 40 Mg Cpdr (Esomeprazole magnesium) .... Once daily as needed 2)  Alprazolam 1 Mg Tabs (Alprazolam) .Marland Kitchen.. 1 by mouth qam; 2 by mouth qpm 3)  Mvi W/fe  4)  Folic Acid 1 Mg Tabs (Folic acid) .... Take one tablet daily 5)  Onetouch Ultra Test  Strp (Glucose blood) .... Test 1-2 times daily 6)  Onetouch Lancets Misc (Lancets) .... Test 1-2 times daily 7)  Vicodin 5-500 Mg Tabs (Hydrocodone-acetaminophen) .Marland Kitchen.. 1 tab by mouth q4-6 as needed for severe pain 8)  Furosemide 20 Mg Tabs (Furosemide) .... Take 1 tablet by mouth daily.  may take 2nd tab as needed for swelling 9)  Proair Hfa 108 (90 Base) Mcg/act Aers (Albuterol sulfate) .... 2 puffs every 4 hours as needed 10)  Fenofibrate 160 Mg Tabs (Fenofibrate) .... Take one tablet daily 11)  Simvastatin 40 Mg Tabs (Simvastatin) .... Take one tablet daily 12)  Lotrel 5-20 Mg Caps (Amlodipine besy-benazepril hcl) .Marland Kitchen.. 1 tab by mouth daily 13)  Naproxen 500 Mg Tabs (Naproxen) .Marland Kitchen.. 1 tab by mouth two times a day x5-7 days and then as needed.  take with food. 14)  Diflucan 150 Mg Tabs (Fluconazole) .... Once daily.  may repeat in 3 days if sxs persist  Patient Instructions: 1)  Follow up as scheduled 2)  Your heart and lungs sound great! 3)  Use the Lasix as needed for swelling- the support hose are working great! 4)  Start the [employer] after vacation 5)  Call with any questions or concerns 6)  HAVE A GREAT TRIP!!!  Laboratory Results   Urine Tests    Routine Urinalysis   Glucose: negative   (Normal Range: Negative) Bilirubin: negative   (Normal Range: Negative) Ketone: negative   (Normal Range: Negative) Spec. Gravity: 1.015   (Normal Range: 1.003-1.035) Blood: negative   (Normal Range: Negative) pH: 6.0   (Normal Range: 5.0-8.0) Protein: negative   (Normal Range: Negative) Urobilinogen: 0.2   (Normal Range: 0-1) Nitrite: negative   (Normal Range: Negative) Leukocyte Esterace: negative   (Normal Range: Negative)

## 2010-10-03 NOTE — Assessment & Plan Note (Signed)
Summary: discuss dm/kdc   Vital Signs:  Patient profile:   70 year old female Temp:     97.4 degrees F oral Pulse rate:   92 / minute Pulse rhythm:   regular Resp:     16 per minute BP sitting:   112 / 70  (left arm) CC: room 16  Follow up to discuss diabetes treatment options   Primary Care Provider:  Neena Rhymes, MD  CC:  room 16  Follow up to discuss diabetes treatment options.  History of Present Illness: Sarah Mcguire is a 70 year old female who presents today for follow up of her newly diagnosed diabetes.  Had dilated eye exam 2 months ago.  She presented yesterday for symptoms of urinary frequency and was treated with cipro for UTI.  She has been receiving steroid injections for her low back pain/hip pain.  She reports continued back and hip pain.  Husband asks if her hip pain is related to fenofibrate.    Allergies: 1)  ! Pcn  Physical Exam  General:  Well-developed,well-nourished,in no acute distress; alert,appropriate and cooperative throughout examination Head:  Normocephalic and atraumatic without obvious abnormalities. No apparent alopecia or balding. Psych:  Cognition and judgment appear intact. Alert and cooperative with normal attention span and concentration. No apparent delusions, illusions, hallucinations   Impression & Recommendations:  Problem # 1:  DIABETES MELLITUS (ICD-250.00) Assessment New Pt unable to provide urine sample, will needs microalbumin sample next visit.  She is up to date on eye exam, already on ACE.  Agreeable to  podiatry referral.  Will also refer to nutrition/diabetes classes.  I spent 30 minutes with the patient.  Greater than 50% of this time was spent counselling patient on her diabetes, diabetic diet, weight loss, foot care.  Plan f/u in 3 months- if no improvement in A1C, consider initiation of metformin.   Her updated medication list for this problem includes:    Lotrel 5-20 Mg Caps (Amlodipine besy-benazepril hcl) .Marland Kitchen... 1 by mouth  once daily  Orders: Podiatry Referral (Podiatry) Nutrition Referral (Nutrition)  Problem # 2:  BACK PAIN, CHRONIC (ICD-724.5) Assessment: Unchanged Patient is following for ESI injections.  Unfortunately her hyperglycemia is likely being exacerbated by these injections.  I doubt that patient's hip pain is related to fenofibrate- I did offer to her that she could try to hold med for a week or two and see if this improves her symptoms- but instructed her to call us if she does stop taking this medication so that we can consider alternative meds.   Her updated medication list for this problem includes:    Tramadol Hcl 50 Mg Tabs (Tramadol hcl) .Marland Kitchen... As needed    Mobic 7.5 Mg Tabs (Meloxicam) .Marland Kitchen... As needed  Complete Medication List: 1)  Lotrel 5-20 Mg Caps (Amlodipine besy-benazepril hcl) .Marland Kitchen.. 1 by mouth once daily 2)  Nexium 40 Mg Cpdr (Esomeprazole magnesium) .... Once daily as needed 3)  Alprazolam 1 Mg Tabs (Alprazolam) .Marland Kitchen.. 1 by mouth qam; 2 by mouth qpm 4)  Maxzide-25 37.5-25 Mg Tabs (Triamterene-hctz) .... As needed. 5)  Tramadol Hcl 50 Mg Tabs (Tramadol hcl) .... As needed 6)  Mobic 7.5 Mg Tabs (Meloxicam) .... As needed 7)  Simvastatin 40 Mg Tabs (Simvastatin) .Marland Kitchen.. 1 by mouth qhs 8)  Mvi W/fe  9)  Folic Acid 1mg   .... 1 by mouth once daily 10)  Fenofibrate 160 Mg Tabs (Fenofibrate) .... Take one tablet daily 11)  Cipro 250 Mg Tabs (Ciprofloxacin  hcl) .... One tablet by mouth two times a day x 3 days 12)  Fluconazole 150 Mg Tabs (Fluconazole) .... One tablet by mouth x1 as needed vaginal itching or discharge 13)  One Touch Ultra Mini Test Strips  14)  Lancets   Patient Instructions: 1)  Fasting sugars should range from 80-110. 2)  ideally sugars should stay under 150 after meals. 3)  Check your sugar daily at different times of the day, record these results and bring to your follow up appointment with Dr Beverely Low in April 4)  1.  Avoid white bread, white pasta and white rice 5)   2.  Avoid high fructose corn syrup 6)  3.  Instead eat brown carbs- Yams, Wheat pasta, whole grained breads, wild rice. and monitor your portions 7)  You will be called about your referral for nutrition counselling/diabetic classes. Prescriptions: LANCETS   #1 box x 1   Entered and Authorized by:   Lemont Fillers FNP   Signed by:   Lemont Fillers FNP on 11/08/2009   Method used:   Print then Give to Patient   RxID:   [id number] ONE TOUCH ULTRA MINI TEST STRIPS   #100 x 1   Entered and Authorized by:   Lemont Fillers FNP   Signed by:   Lemont Fillers FNP on 11/08/2009   Method used:   Print then Give to Patient   RxID:   [id number]   Current Allergies (reviewed today): ! PCN

## 2010-10-03 NOTE — Progress Notes (Signed)
Summary: alprazolam refill   Phone Note Refill Request Call back at [phone] Message from:  Patient on May 02, 2010 11:10 AM  Refills Requested: Medication #1:  ALPRAZOLAM 1 MG TABS 1 by mouth qam; 2 by mouth qpm   Dosage confirmed as above?Dosage Confirmed   Supply Requested: 1 month [facility]  Next Appointment Scheduled: 05/04/10 Initial call taken by: Lavell Islam,  May 02, 2010 11:11 AM  Follow-up for Phone Call        ok for #90, 3 refills Follow-up by: Neena Rhymes MD,  May 02, 2010 1:28 PM    Prescriptions: ALPRAZOLAM 1 MG TABS (ALPRAZOLAM) 1 by mouth qam; 2 by mouth qpm  #90 x 3   Entered by:   Doristine Devoid CMA   Authorized by:   Neena Rhymes MD   Signed by:   Doristine Devoid CMA on 05/02/2010   Method used:   Printed then faxed to ...       [facility]  [address] [phone]* (retail)       [address]       [place], [place]  [address]       Ph: [phone]       Fax: [phone]   RxID:   [id number]

## 2010-10-03 NOTE — Progress Notes (Signed)
Summary: Care Plan Oversight  Phone Note Outgoing Call   Call placed by: Acey Lav MD,  Jan 03, 2010 10:01 AM Details for Reason: Care Plan Oversight Summary of Call: [phone] (30 or more mins)  I have supervised home care and/or infusion therapy for this pt, including providing orders for care, review of labs and/or home health care plans, communicating with the home health care professionals and/or patient/caregivers to integrate current information into the medical treatment plan and/or adjust the medical therapy. This supervision has been provided for __42minutes during the calendar month. Dates for this oversight 01/02/10 thru 6/1/11_.   Initial call taken by: Acey Lav MD,  Jan 03, 2010 10:01 AM  New Problems: ABSCESS OF LIVER (ICD-572.0)   New Problems: ABSCESS OF LIVER (ICD-572.0) New/Updated Medications: INVANZ 1 GM SOLR (ERTAPENEM SODIUM) once daily x 3 weeks

## 2010-10-03 NOTE — Assessment & Plan Note (Signed)
Summary: hsfu need chart/wl 4/11 ser liver abscess/kam   Vital Signs:  Patient profile:   70 year old female Height:      63.5 inches (161.29 cm) Weight:      178.8 pounds (81.27 kg) BMI:     31.29 Temp:     97.5 degrees F (36.39 degrees C) oral Pulse rate:   116 / minute BP sitting:   120 / 86  (left arm)  Vitals Entered By: Starleen Arms CMA (Jan 23, 2010 11:00 AM)  CC: hsfu, questions about finishing furosemide, and fluconazole Is Patient Diabetic? No Pain Assessment Patient in pain? no      Nutritional Status BMI of > 30 = obese Nutritional Status Detail nl  Does patient need assistance? Functional Status Self care Ambulation Normal   Visit Type:  Follow-up Primary Provider:  Neena Rhymes, MD  CC:  hsfu, questions about finishing furosemide, and and fluconazole.  History of Present Illness: 70 year old with history of two prior episodes of diverticulitis who in March had left lower quadrant abdominal pain and was seen by PCP and dx with possible UTI and given oral antibiotics. Urine culture oly grew mixed flora at 80k and pts symptoms persisted, then improved. She then developed fevers chills, malaise and was given azithromycin  but became severely ill snad was asdmitted to th eICU at [facility] in septic shock on April 25th, 2011. She was ultimately found to have multiple liver abscesses as well as an area of portal vein thrombosis.She was covered wtih imipenem, which was changed to ertapenem. LIver abscess was aspirated by IR and failed to grow any organisms on antibiotics She has been continued on IV ertapenem with resolution of liver abscesses by CT scan don one on Friday May 20th. She returns for folluwup in [facility] today. Pt denies fevers, chills, abdominal pain. She has stopped using the home O2 that she was sent home from the hospital with and she and her husband had multiple questions about this, the PT she is receiving and whether or not she should continue the  lasix that had been started as an inpatient. I also had lengthy discussion with them about my strong advice that the patient be evaluated by a general surgeon for elective partial colectomy for recurrent diverticulitis. I spent over an hour with this patient including at least 30 minutes spent with face to face counselling.  Preventive Screening-Counseling & Management  Alcohol-Tobacco     Smoking Status: current     Smoking Cessation Counseling: yes     Smoke Cessation Stage: precontemplative     Packs/Day: 1/2  Caffeine-Diet-Exercise     Diet Comments: low carb diet     Does Patient Exercise: yes     Type of exercise: physical therapy  Allergies: 1)  ! Pcn 2)  ! Amoxicillin (Amoxicillin) 3)  ! Celexa 4)  ! Zonegran (Zonisamide) 5)  ! Neurontin  Past History:  Past Surgical History: Last updated: 06/06/2009 Tonsillectomy R ear tube (9/10)  Family History: Last updated: 01/14/2009 DM - M HTN - M CAD - F MI age 70's stroke - no colon Ca - no breast Ca - no ovarian/uterine Ca - no cousins w/ TIA, MI  Social History: Last updated: 09/06/2008 Married, smoker- 1/2ppd. 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    Physical Exam  General:  alert, well-developed, and well-nourished.   Head:  normocephalic, atraumatic, and no abnormalities observed.   Eyes:  vision grossly intact, pupils equal, and pupils round.   Ears:  no external deformities and ear piercing(s) noted.   Nose:  no external erythema.   Mouth:  pharynx pink and moist, no erythema, and no exudates.   Neck:  supple and full ROM.   Lungs:  normal respiratory effort, no crackles, and no wheezes.   Heart:  normal rate, regular rhythm, no murmur, no gallop, and no rub.   Abdomen:  soft, non-tender, normal bowel sounds, no distention, and no masses.   Msk:  normal ROM and no joint deformities.   Extremities:  1+ left pedal edema and 1+ right pedal edema.   Neurologic:  alert & oriented X3, strength normal in all extremities, and gait normal.   Skin:  no rashes.  PICC site clean Psych:  Oriented X3, memory intact for recent and remote, and moderately anxious.     Impression & Recommendations:  Problem # 1:  ABSCESS OF LIVER (ICD-572.0) Assessment Improved These have largely resolved, I am having the PICC line pulled today and I will give her an additional 2 weeks of oral cipro and flagyl. As mentioned above my strong suspicioin is for diverticulitis as the cause of these. See below Orders: Surgical Referral (Surgery) Est. Patient Level V (16109)  Problem # 2:  DIVERTICULITIS OF COLON (ICD-562.11) As mentioned I think she needs elective partial colectomy, hopefully with primary anastamosis and done laparoscopically. Orders: Surgical Referral (Surgery) Est. 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 Hepatology felt she did not require anticoagulation and thrombus is smaller on CT  scan  Orders: Est. Patient Level V (09811)  Problem # 4:  SEPSIS (ICD-995.91)  due to liver abscess see above  Orders: Est. Patient Level V (91478)  Problem # 5:  CANDIDIASIS OF MOUTH (ICD-112.0)  resolved with fluconazole  Orders: Est. Patient Level V (29562)  Problem # 6:  THROMBOCYTOSIS (ICD-289.9)  likely due to her liver abscesses and inflammation, still slightly elevated on labs from [facility]  Orders: Est. Patient Level V (13086)  Problem # 7:  HEART FAILURE, CONGESTIVE UNSPEC (ICD-428.0)  She has stopped her O2 therapy and is satting 100% here in [facility]. I will stop her home O2. I have asked her to communicate further with her PCP as to whether or not to continue the lasix (I haefv told her to do so for now) Her updated medication list for this problem includes:    Furosemide 20 Mg Tabs (Furosemide) .Marland Kitchen... Take 1 tablet by mouth once a day per hospital md  Orders: Est. Patient Level V ([address])  Medications Added to Medication List This Visit: 1)  Ciprofloxacin Hcl 500 Mg Tabs (Ciprofloxacin hcl) .... Take 1 tablet by mouth two times a day for 2 weeks  2)  Metronidazole 500 Mg Tabs (Metronidazole) .... Take 1 tablet by mouth three times a day for two weeks  Patient Instructions: 1)  I will stop your IV antibiotics  2)  If your oxygen levels are fine we will stop your home oxygen 3)  YOU NEED TO BE SEEN BY A SURGEON FOR RECURRENT DIVERTICULITIS WHICH I BELIEVE CAUSED YOUR LIVER ABSCESSES 4)  [facility] IS THEE  MAIN GROUP IN [place] AND I THINK THEY ARE EXCELLENT 5)  Please ask your primary care MD about the lasix rx Prescriptions: METRONIDAZOLE 500 MG TABS (METRONIDAZOLE) Take 1 tablet by mouth three times a day for two weeks  #42 x 1   Entered and Authorized by:   Acey Lav MD   Signed by:   Paulette Blanch Dam MD on 01/23/2010   Method used:   Print then Give to Patient   RxID:   [id number] CIPROFLOXACIN HCL 500 MG TABS (CIPROFLOXACIN HCL)  Take 1 tablet by mouth two times a day for 2 weeks  #28 x 1   Entered and Authorized by:   Acey Lav MD   Signed by:   Paulette Blanch Dam MD on 01/23/2010   Method used:   Print then Give to Patient   RxID:   [id number] METRONIDAZOLE 500 MG TABS (METRONIDAZOLE) Take 1 tablet by mouth three times a day for two weeks  #42 x 1   Entered and Authorized by:   Acey Lav MD   Signed by:   Paulette Blanch Dam MD on 01/23/2010   Method used:   Electronically to        [facility] [phone]* (retail)       [address]       [place], [place]  [address]       Ph: [phone]       Fax: [phone]   RxID:   [id number] CIPROFLOXACIN HCL 500 MG TABS (CIPROFLOXACIN HCL) Take 1 tablet by mouth two times a day for 2 weeks  #28 x 1   Entered and Authorized by:   Acey Lav MD   Signed by:   Paulette Blanch Dam MD on 01/23/2010   Method used:   Electronically to        [facility] [phone]* (retail)       [address]       [place], [place]  [address]       Ph: [phone]       Fax: [phone]   RxID:   [id number]  V.O. D/C PICC today per Dr. Daiva Eves. Jennet Maduro RN  Jan 23, 2010 2:29 PM  Order to remove PICC line received from Dr. Daiva Eves.  Pt. identified with name and date of birth. PICC dressing removed, site unremarkable.  PICC line removed using sterile procedure @ 1230 pm.   PICC length equal to that noted in pt's hospital chart of 44 cm.  Sterile petroleum gauze + sterile 4X4 applied to PICC site, pressure applied for 10 minutes and covered with Medipore take as a pressure dressing.  Pt. instructed to limit use of arm for 1 hour.  Pt. instructed that the pressure dressing should remain in place for 24 hours.  Pt. and husband verablized understanding of these instructions.  Jennet Maduro RN  Jan 23, 2010 2:34 PM    Appended Document: hsfu need  chart/wl 4/11 ser liver abscess/kam

## 2010-10-03 NOTE — Assessment & Plan Note (Signed)
Summary: PER CHECK OUT/SF   Primary Provider:  Neena Rhymes, MD  CC:  check up.  History of Present Illness: Sarah Mcguire is seen at the request of Dr Artist Pais.  She was  hospitalized  5/11 for sepsis thought secondary to diverticulitis.  She was quite ill and had ? CHF with volume overload.  She had an echo about a year ago with normal EF and echo 4/11 in hospital suggested EF 45%. F/U echo 5/11 showed EF 50-55% with abnormal septal motion.  F/U nuclear reviewed and no ischemia with normal EF   She was thought possibly to have decreased LV from sepsis syndrome.  She has done well since D/c except for her anxiety.  She has been doing rehab and still has a lot of leg weakness.  She denies SSCP, dyspnea or previous CAD.    Current Problems (verified): 1)  Obesity  (ICD-278.00) 2)  Hip Pain  (ICD-719.45) 3)  Plantar Fasciitis, Right  (ICD-728.71) 4)  Edema- Localized  (ICD-782.3) 5)  Portal Vein Thrombosis  (ICD-452) 6)  Abscess of Liver  (ICD-572.0) 7)  Diverticulitis of Colon  (ICD-562.11) 8)  Heart Failure, Congestive Unspec  (ICD-428.0) 9)  Sepsis  (ICD-995.91) 10)  Candidiasis of Mouth  (ICD-112.0) 11)  Gerd  (ICD-530.81) 12)  Thrombocytosis  (ICD-289.9) 13)  Leukocytosis  (ICD-288.60) 14)  Anemia  (ICD-285.9) 15)  Acute Respiratory Failure  (ICD-518.81) 16)  Abscess of Liver  (ICD-572.0) 17)  Vaginitis, Atrophic  (ICD-627.3) 18)  Back Pain, Chronic  (ICD-724.5) 19)  Diabetes Mellitus  (ICD-250.00) 20)  Smoker  (ICD-305.1) 21)  Uti  (ICD-599.0) 22)  Vaginitis  (ICD-616.10) 23)  Preventive Health Care  (ICD-V70.0) 24)  Screening For Malignant Neoplasm of The Cervix  (ICD-V76.2) 25)  Eustachian Tube Dysfunction  (ICD-381.81) 26)  Heart Murmur, Systolic  (ICD-785.2) 27)  Sinusitis- Acute-nos  (ICD-461.9) 28)  Hx of Diverticulitis of Colon  (ICD-562.11) 29)  Depressive Disorder  (ICD-311) 30)  Special Screening For Malignant Neoplasms Colon  (ICD-V76.51) 31)  Hypertension   (ICD-401.9) 32)  Hyperlipidemia  (ICD-272.4)  Current Medications (verified): 1)  Nexium 40 Mg Cpdr (Esomeprazole Magnesium) .... Once Daily As Needed 2)  Alprazolam 1 Mg Tabs (Alprazolam) .Marland Kitchen.. 1 By Mouth Qam; 2 By Mouth Qpm 3)  Qc Daily Multivitamins/iron  Tabs (Multiple Vitamins-Iron) .... Take One Tablet Daily 4)  Folic Acid 1 Mg Tabs (Folic Acid) .... Take One Tablet Daily 5)  Onetouch Ultra Blue  Strp (Glucose Blood) .... Test 1-2 Times Daily As Directed 6)  Onetouch Lancets  Misc (Lancets) .... Test 1-2 Times Daily 7)  Vicodin 5-500 Mg Tabs (Hydrocodone-Acetaminophen) .Marland Kitchen.. 1 Tab By Mouth Q4-6 As Needed For Severe Pain 8)  Furosemide 20 Mg Tabs (Furosemide) .... Take 1 Tablet By Mouth Daily.  May Take 2nd Tab As Needed For Swelling 9)  Fenofibrate 160 Mg Tabs (Fenofibrate) .... Take One Tablet Daily 10)  Simvastatin 40 Mg Tabs (Simvastatin) .... Take One Tablet Daily 11)  Lotrel 5-20 Mg Caps (Amlodipine Besy-Benazepril Hcl) .Marland Kitchen.. 1 Tab By Mouth Daily 12)  Calcium 750mg  .... Take One Tablet Daily 13)  Mobic 7.5 Mg Tabs (Meloxicam) .... As Needed  Allergies (verified): 1)  ! Pcn 2)  ! Amoxicillin (Amoxicillin) 3)  ! Celexa 4)  ! Zonegran (Zonisamide) 5)  ! Neurontin 6)  ! * Zonismide  Past History:  Past Medical History: Last updated: 01/25/2010 Hyperlipidemia Hypertension Volume overload and heart failure after fluid rescuscitation for septic shock Echo 02/2009 normal EF,  Echo during sepsis 4/11 EF 45-50% Sepsis secondary to liver abscesses, including hypotension or shock.  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 Alcohol Use - no  Review of Systems       Denies fever, malais, weight loss, blurry vision, decreased visual acuity, cough, sputum, SOB, hemoptysis, pleuritic pain, palpitaitons, heartburn, abdominal pain, melena, lower extremity edema, claudication, or rash.   Vital Signs:  Patient profile:   70 year old female Height:      63 inches Weight:      178 pounds BMI:     31.65 Pulse rate:   94 / minute Resp:     14 per minute BP sitting:   120 / 82  (left arm)  Vitals Entered By: Kem Parkinson (August 01, 2010 2:38 PM)  Physical Exam  General:  Affect appropriate Healthy:  appears stated age HEENT: normal Neck supple with no adenopathy JVP normal no bruits no thyromegaly Lungs clear with no wheezing and good diaphragmatic motion Heart:  S1/S2 no murmur,rub, gallop or click PMI normal Abdomen: benighn, BS positve, no tenderness, no AAA no bruit.  No HSM or HJR Distal pulses intact with no bruits No edema Neuro non-focal Skin warm and dry    Impression & Recommendations:  Problem # 1:  HYPERTENSION (ICD-401.9) Well controlled Her updated medication list for this problem includes:    Furosemide 20 Mg Tabs (Furosemide) .Marland Kitchen... Take 1 tablet by mouth daily.  may take 2nd tab as needed for swelling    Lotrel 5-20 Mg Caps (Amlodipine besy-benazepril hcl) .Marland Kitchen... 1 tab by mouth daily  Problem # 2:  HYPERLIPIDEMIA (ICD-272.4) F/U LFT swith primary.  Careful with comb of fibrate and statin Her updated medication list for this problem includes:    Fenofibrate 160 Mg Tabs (Fenofibrate) .Marland Kitchen... Take one tablet daily    Simvastatin 40 Mg Tabs (Simvastatin) .Marland Kitchen... Take one tablet daily  Problem # 3:  CARDIOMYOPATHY, DILATED (ICD-425.4) Improved EF by echo and myovue  no evid of CAD  Likely  related to sepsis  Consdier F/U echo in 2 years or if symptoms as needed cardiology F/U Her updated medication list for this problem includes:    Furosemide 20 Mg Tabs (Furosemide) .Marland Kitchen... Take 1 tablet by mouth daily.  may take 2nd tab as needed for swelling    Lotrel 5-20 Mg Caps (Amlodipine besy-benazepril hcl) .Marland Kitchen... 1 tab by mouth daily

## 2010-10-03 NOTE — Miscellaneous (Signed)
Summary: Problems, Medications and Allergies updated  Clinical Lists Changes  Problems: Added new problem of ACUTE RESPIRATORY FAILURE (ICD-518.81) - hypoxic, pt. discharged on O2 Added new problem of ANEMIA (ICD-285.9) Added new problem of LEUKOCYTOSIS (ICD-288.60) Added new problem of THROMBOCYTOSIS (ICD-289.9) - reactive Added new problem of GERD (ICD-530.81) Added new problem of CANDIDIASIS OF MOUTH (ICD-112.0) Added new problem of SEPSIS (ICD-995.91) - SECONDARY TO LIVER ABSCESSES, INCLUDING HYPOTENSION OR SHOCK Medications: Added new medication of FUROSEMIDE 20 MG TABS (FUROSEMIDE) Take 1 tablet by mouth once a day per hospital MD Added new medication of FLUCONAZOLE 100 MG TABS (FLUCONAZOLE) Take 1 tablet by mouth once a day through Jan 11, 2010 Removed medication of LOTREL 5-20 MG CAPS (AMLODIPINE BESY-BENAZEPRIL HCL) 1 by mouth once daily Removed medication of MOBIC 7.5 MG TABS (MELOXICAM) as needed Added new medication of VICODIN 5-500 MG TABS (HYDROCODONE-ACETAMINOPHEN) Take 1 tablet by mouth every 6 hours as needed for pain per Hospital MD Allergies: Added new allergy or adverse reaction of AMOXICILLIN (AMOXICILLIN) Added new allergy or adverse reaction of CELEXA Added new allergy or adverse reaction of ZONEGRAN (ZONISAMIDE) Added new allergy or adverse reaction of NEURONTIN

## 2010-10-03 NOTE — Progress Notes (Signed)
Summary: questions about O2  Phone Note Other Incoming Call back at [phone]   Caller: Sarah Mcguire- [facility] Summary of Call: Spoke w/ Almyra Free in regards to patient oxygen b/c there where some questions about whether she was going to stopped using. Informed her that this isn't the case patient is allow to be off oxygen when at rest or sitting as long as her levels aren' t falling below 88% per Dr. Beverely Low but also informed that if she would monitor patient also while up walking around to determine need as well again as long as she doesn't fall below 88%. She visited patient yesterday and she was off oxygen and remained in the 90's but after about 5 min she dropped but still in 90's. So she will montior patient oxygen levels and contact our office if any questions. Initial call taken by: Doristine Devoid,  Jan 12, 2010 1:51 PM

## 2010-10-05 NOTE — Progress Notes (Signed)
Summary: alprazolam refill  Phone Note Refill Request Message from:  Fax from Pharmacy on August 22, 2010 10:16 AM  Refills Requested: Medication #1:  ALPRAZOLAM 1 MG TABS 1 by mouth qam; 2 by mouth qpm   Last Refilled: 07/25/2010 [id number], [address] [place], [place] [address]   ph=[phone], fax [phone]    qty = 90  Next Appointment Scheduled: Mon 09/11/2010 [facility] Initial call taken by: Jerolyn Shin,  August 22, 2010 10:17 AM    Prescriptions: ALPRAZOLAM 1 MG TABS (ALPRAZOLAM) 1 by mouth qam; 2 by mouth qpm  #90 x 1   Entered by:   Doristine Devoid CMA   Authorized by:   Neena Rhymes MD   Signed by:   Doristine Devoid CMA on 08/22/2010   Method used:   Printed then faxed to ...       [facility]  [address] [phone]* (retail)       [address]       [place], [place]  [address]       Ph: [phone]       Fax: [phone]   RxID:   [id number]

## 2010-10-05 NOTE — Consult Note (Signed)
Summary: [facility]   Imported By: Lanelle Bal 08/14/2010 11:54:05  _____________________________________________________________________  External Attachment:    Type:   Image     Comment:   External Document

## 2010-10-05 NOTE — Assessment & Plan Note (Signed)
Summary: rto 3 months/cbs   Vital Signs:  Patient profile:   70 year old female Weight:      181 pounds BMI:     32.18 Pulse rate:   70 / minute BP sitting:   122 / 78  (left arm)  Vitals Entered By: Doristine Devoid CMA (September 11, 2010 11:01 AM) CC: DM f/u   History of Present Illness: 70 yo woman here today for f/u on DM.  CBGs ranging from 70-145 w/ most readings <115.  no symptomatic lows.  walking on the treadmill.  no symptomatic lows.  no N/V/D, CP, SOB, HAs, visual changes  panic attacks- have been increasing in frequency over the holidays.  will have SOB to the point of hyperventilating.  'i'm a hypochondriac'.  hyperlipidemia- on simvastatin 40mg .  also on amlodipine.  would rather decrease meds to 20mg  rather than switch meds.  hasn't been on meds recently due to anti-fungal meds from GYN.  HTN- pt got notice from insurance that Lotrel is no longer preferred and needs to switch to the component meds.    Current Medications (verified): 1)  Nexium 40 Mg Cpdr (Esomeprazole Magnesium) .... Once Daily As Needed 2)  Alprazolam 1 Mg Tabs (Alprazolam) .Marland Kitchen.. 1 By Mouth Qam; 2 By Mouth Qpm and Q4 As Needed 3)  Qc Daily Multivitamins/iron  Tabs (Multiple Vitamins-Iron) .... Take One Tablet Daily 4)  Folic Acid 1 Mg Tabs (Folic Acid) .... Take One Tablet Daily 5)  Onetouch Ultra Blue  Strp (Glucose Blood) .... Test 1-2 Times Daily As Directed 6)  Onetouch Lancets  Misc (Lancets) .... Test 1-2 Times Daily 7)  Vicodin 5-500 Mg Tabs (Hydrocodone-Acetaminophen) .Marland Kitchen.. 1 Tab By Mouth Q4-6 As Needed For Severe Pain 8)  Furosemide 20 Mg Tabs (Furosemide) .... Take 1 Tablet By Mouth Daily.  May Take 2nd Tab As Needed For Swelling 9)  Fenofibrate 160 Mg Tabs (Fenofibrate) .... Take One Tablet Daily 10)  Simvastatin 20 Mg Tabs (Simvastatin) .Marland Kitchen.. 1 By Mouth At Bedtime 11)  Amlodipine Besylate 5 Mg  Tabs (Amlodipine Besylate) .Marland Kitchen.. 1 By Mouth Every Day 12)  Calcium 750mg  .... Take One Tablet  Daily 13)  Mobic 7.5 Mg Tabs (Meloxicam) .... As Needed 14)  Benazepril Hcl 20 Mg  Tabs (Benazepril Hcl) .Marland Kitchen.. 1 By Mouth Daily  Allergies (verified): 1)  ! Pcn 2)  ! Amoxicillin (Amoxicillin) 3)  ! Celexa 4)  ! Zonegran (Zonisamide) 5)  ! Neurontin 6)  ! * Zonismide  Past History:  Past medical, surgical, family and social histories (including risk factors) reviewed, and no changes noted (except as noted below).  Past Medical History: Reviewed history from 01/25/2010 and no changes required. Hyperlipidemia Hypertension Volume overload and heart failure after fluid rescuscitation for septic shock Echo 02/2009 normal EF, Echo during sepsis 4/11 EF 45-50% Sepsis secondary to liver abscesses, including hypotension or shock.  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 Tonsillectomy R ear tube (9/10)  Family History: Reviewed history from 01/14/2009 and no changes required. DM - M HTN - M CAD - F MI age 20's stroke - no colon Ca - no breast Ca - no ovarian/uterine Ca - no cousins w/ TIA, MI  Social History: Reviewed history from 09/06/2008 and  no changes required. Married, smoker- 1/2ppd. Alcohol Use - no  Review of Systems      See HPI  Physical Exam  General:  Well-developed,well-nourished,in no acute distress; alert,appropriate and cooperative throughout examination Head:  Normocephalic and atraumatic without obvious abnormalities. No apparent alopecia or balding.  Eyes:  PERRL, EOMI Neck:  No deformities, masses, or tenderness noted. Lungs:  Normal respiratory effort, chest expands symmetrically. Lungs are clear to auscultation, no crackles or  wheezes. Heart:  Normal rate and regular rhythm. S1 and S2 normal without gallop, murmur, click, rub or other extra sounds. Abdomen:  soft, NT/ND, +BS Pulses:  +2 radial, DP/PT Extremities:  No clubbing, cyanosis, edema, or deformity noted  Psych:  Oriented X3, memory intact for recent and remote, normally interactive, good eye contact, not anxious appearing, and not depressed appearing.     Impression & Recommendations:  Problem # 1:  DIABETES MELLITUS (ICD-250.00) Assessment Unchanged check labs today.  CBGs remain well controlled w/ diet.  pt fears starting medication.  check A1C. Her updated medication list for this problem includes:    Benazepril Hcl 20 Mg Tabs (Benazepril hcl) .Marland Kitchen... 1 by mouth daily  Orders: Venipuncture (81191) TLB-A1C / Hgb A1C (Glycohemoglobin) (83036-A1C) Specimen Handling (47829)  Problem # 2:  DEPRESSIVE DISORDER (ICD-311) Assessment: Deteriorated holidays increased pt's panic attacks.  will liberalize amount of xanax available to pt rather than increase dose b/c pt seems to be able to control sxs throughout the year. Her updated medication list for this problem includes:    Alprazolam 1 Mg Tabs (Alprazolam) .Marland Kitchen... 1 by mouth qam; 2 by mouth qpm and q4 as needed  Problem # 3:  HYPERLIPIDEMIA (ICD-272.4) Assessment: Unchanged will check LFTs due to fact pt has been on anti-fungal and is resuming cholesterol med.  will decrease dose of Simvastatin since she is on Amlodipine. Her updated medication list for this problem includes:    Fenofibrate 160 Mg Tabs (Fenofibrate) .Marland Kitchen... Take one tablet daily    Simvastatin 20 Mg Tabs (Simvastatin) .Marland Kitchen... 1 by mouth at bedtime  Orders: TLB-Hepatic/Liver Function Pnl (80076-HEPATIC) Specimen Handling (56213)  Problem # 4:  HYPERTENSION (ICD-401.9) Assessment: Unchanged split lotrel into components. Her updated medication list for this problem includes:    Furosemide 20 Mg Tabs (Furosemide) .Marland Kitchen... Take 1 tablet by  mouth daily.  may take 2nd tab as needed for swelling    Amlodipine Besylate 5 Mg Tabs (Amlodipine besylate) .Marland Kitchen... 1 by mouth every day    Benazepril Hcl 20 Mg Tabs (Benazepril hcl) .Marland Kitchen... 1 by mouth daily  Complete Medication List: 1)  Nexium 40 Mg Cpdr (Esomeprazole magnesium) .... Once daily as needed 2)  Alprazolam 1 Mg Tabs (Alprazolam) .Marland Kitchen.. 1 by mouth qam; 2 by mouth qpm and q4 as needed 3)  Qc Daily Multivitamins/iron Tabs (Multiple vitamins-iron) .... Take one tablet daily 4)  Folic Acid 1 Mg Tabs (Folic acid) .... Take one tablet daily 5)  Onetouch Ultra Blue Strp (Glucose blood) .... Test 1-2 times daily as directed 6)  Onetouch Lancets Misc (Lancets) .... Test 1-2 times daily 7)  Vicodin 5-500 Mg Tabs (Hydrocodone-acetaminophen) .Marland Kitchen.. 1 tab by mouth q4-6 as needed for severe pain 8)  Furosemide 20 Mg Tabs (Furosemide) .... Take 1 tablet by mouth daily.  may take 2nd tab as needed for swelling 9)  Fenofibrate 160 Mg Tabs (Fenofibrate) .... Take one tablet daily 10)  Simvastatin 20 Mg Tabs (Simvastatin) .Marland Kitchen.. 1 by mouth at bedtime 11)  Amlodipine Besylate 5  Mg Tabs (Amlodipine besylate) .Marland Kitchen.. 1 by mouth every day 12)  Calcium 750mg   .... Take one tablet daily 13)  Mobic 7.5 Mg Tabs (Meloxicam) .... As needed 14)  Benazepril Hcl 20 Mg Tabs (Benazepril hcl) .Marland Kitchen.. 1 by mouth daily  Patient Instructions: 1)  Follow up in 3 months to recheck diabetes and cholesterol 2)  Keep up the good work on your diet- add the exercise.  Goal is 4-5x/week for at least 30 minutes 3)  We'll notify you of your lab results 4)  Take the Alprazolam as needed for anxiety- I can't do 3 months b/c it is too many pills and these are controlled 5)  Call with any questions or concerns 6)  Happy New Year! Prescriptions: ALPRAZOLAM 1 MG TABS (ALPRAZOLAM) 1 by mouth qam; 2 by mouth qpm and Q4 as needed  #150 x 3   Entered and Authorized by:   Neena Rhymes MD   Signed by:   Neena Rhymes MD on 09/11/2010    Method used:   Print then Give to Patient   RxID:   [id number] FENOFIBRATE 160 MG TABS (FENOFIBRATE) take one tablet daily  #90 x 3   Entered and Authorized by:   Neena Rhymes MD   Signed by:   Neena Rhymes MD on 09/11/2010   Method used:   Print then Give to Patient   RxID:   [id number] FUROSEMIDE 20 MG TABS (FUROSEMIDE) Take 1 tablet by mouth daily.  may take 2nd tab as needed for swelling  #180 x 3   Entered and Authorized by:   Neena Rhymes MD   Signed by:   Neena Rhymes MD on 09/11/2010   Method used:   Print then Give to Patient   RxID:   [id number] ONETOUCH ULTRA BLUE  STRP (GLUCOSE BLOOD) test 1-2 times daily as directed  #100 x 3   Entered and Authorized by:   Neena Rhymes MD   Signed by:   Neena Rhymes MD on 09/11/2010   Method used:   Print then Give to Patient   RxID:   [id number] FOLIC ACID 1 MG TABS (FOLIC ACID) take one tablet daily  #90 x 3   Entered and Authorized by:   Neena Rhymes MD   Signed by:   Neena Rhymes MD on 09/11/2010   Method used:   Print then Give to Patient   RxID:   [id number] SIMVASTATIN 20 MG TABS (SIMVASTATIN) 1 by mouth at bedtime  #90 x 3   Entered and Authorized by:   Neena Rhymes MD   Signed by:   Neena Rhymes MD on 09/11/2010   Method used:   Print then Give to Patient   RxID:   [id number] BENAZEPRIL HCL 20 MG  TABS (BENAZEPRIL HCL) 1 by mouth daily  #90 x 3   Entered and Authorized by:   Neena Rhymes MD   Signed by:   Neena Rhymes MD on 09/11/2010   Method used:   Print then Give to Patient   RxID:   [id number] AMLODIPINE BESYLATE 5 MG  TABS (AMLODIPINE BESYLATE) 1 by mouth every day  #90 x 3   Entered and Authorized by:   Neena Rhymes MD   Signed by:   Neena Rhymes MD on 09/11/2010   Method used:   Print then Give to Patient   RxID:   [id number]    Orders Added: 1)  Venipuncture [60737] 2)  TLB-A1C / Hgb A1C (Glycohemoglobin)  [83036-A1C] 3)  TLB-Hepatic/Liver Function  Pnl [80076-HEPATIC] 4)  Specimen Handling [99000] 5)  Est. Patient Level IV [13244]

## 2010-10-11 ENCOUNTER — Encounter: Payer: Self-pay | Admitting: Family Medicine

## 2010-10-19 NOTE — Assessment & Plan Note (Signed)
Summary: talk about nerves///sph   Vital Signs:  Patient profile:   70 year old female Weight:      181 pounds BMI:     32.18 Pulse rate:   86 / minute BP sitting:   114 / 82  (left arm)  Vitals Entered By: Doristine Devoid CMA (October 02, 2010 2:20 PM) CC: sinus pressure and take about nerves    History of Present Illness: 70 yo woman here today  1) sinus congestion- sxs started 3 weeks ago.  multiple people sick at church.  no fever.  R ear fullness and drainage from ear tube.  does not have drops.  intermittant cough.  + nasal congestion.  2) anxiety- 'i have a daughter who is a tramp'.  relationship is strained, pt doesn't approve of her decisions, embarrassed by her actions.  trying to maintain good relationship w/ grandaughter.  holidays were very stressful for pt.  Current Medications (verified): 1)  Nexium 40 Mg Cpdr (Esomeprazole Magnesium) .... Once Daily As Needed 2)  Alprazolam 1 Mg Tabs (Alprazolam) .Marland Kitchen.. 1 By Mouth Qam; 2 By Mouth Qpm and Q4 As Needed 3)  Qc Daily Multivitamins/iron  Tabs (Multiple Vitamins-Iron) .... Take One Tablet Daily 4)  Folic Acid 1 Mg Tabs (Folic Acid) .... Take One Tablet Daily 5)  Onetouch Ultra Blue  Strp (Glucose Blood) .... Test 1-2 Times Daily As Directed 6)  Onetouch Lancets  Misc (Lancets) .... Test 1-2 Times Daily 7)  Vicodin 5-500 Mg Tabs (Hydrocodone-Acetaminophen) .Marland Kitchen.. 1 Tab By Mouth Q4-6 As Needed For Severe Pain 8)  Furosemide 20 Mg Tabs (Furosemide) .... Take 1 Tablet By Mouth Daily.  May Take 2nd Tab As Needed For Swelling 9)  Fenofibrate 160 Mg Tabs (Fenofibrate) .... Take One Tablet Daily 10)  Simvastatin 20 Mg Tabs (Simvastatin) .Marland Kitchen.. 1 By Mouth At Bedtime 11)  Amlodipine Besylate 5 Mg  Tabs (Amlodipine Besylate) .Marland Kitchen.. 1 By Mouth Every Day 12)  Calcium 750mg  .... Take One Tablet Daily 13)  Mobic 7.5 Mg Tabs (Meloxicam) .... As Needed 14)  Benazepril Hcl 20 Mg  Tabs (Benazepril Hcl) .Marland Kitchen.. 1 By Mouth Daily 15)  Sertraline Hcl 50  Mg Tabs (Sertraline Hcl) .... 1/2 Tab By Mouth Daily X 2 Weeks and Then Increase To 1 Tab Daily  Allergies (verified): 1)  ! Pcn 2)  ! Amoxicillin (Amoxicillin) 3)  ! Celexa 4)  ! Zonegran (Zonisamide) 5)  ! Neurontin 6)  ! * Zonismide  Review of Systems      See HPI  Physical Exam  General:  Well-developed,well-nourished,in no acute distress; alert,appropriate and cooperative throughout examination Head:  Normocephalic and atraumatic without obvious abnormalities. No apparent alopecia or balding. no TTP over sinuses Eyes:  no injxn or inflammation Ears:  R PE tube out of position, lying in canal.  TM normal. L TM normal Nose:  + congestion, mild turbinate edema. Mouth:  +PND Psych:  tearful when talking about her daughter and her recent stressors.  obviously anxious and depressed.   Impression & Recommendations:  Problem # 1:  RHINITIS (ICD-477.9) Assessment New pt's sxs are more allergic than infectious.  start Zyrtec which pt has taken previously.  reviewed supportive care and red flags that should prompt return.  Pt expresses understanding and is in agreement w/ this plan.  Problem # 2:  DEPRESSIVE DISORDER (ICD-311) Assessment: Deteriorated  this is 1st i'm hearing about pt's strained relationship w/ daughter and the first she's admitted her depression is not controlled.  open to idea today of starting SSRI for sxs.  also recommended counseling.  pt in agreement. Her updated medication list for this problem includes:    Alprazolam 1 Mg Tabs (Alprazolam) .Marland Kitchen... 1 by mouth qam; 2 by mouth qpm and q4 as needed    Sertraline Hcl 50 Mg Tabs (Sertraline hcl) .Marland Kitchen... 1/2 tab by mouth daily x 2 weeks and then increase to 1 tab daily  Orders: Prescription Created Electronically [id number]  Complete Medication List: 1)  Nexium 40 Mg Cpdr (Esomeprazole magnesium) .... Once daily as needed 2)  Alprazolam 1 Mg Tabs (Alprazolam) .Marland Kitchen.. 1 by mouth qam; 2 by mouth qpm and q4 as needed 3)   Qc Daily Multivitamins/iron Tabs (Multiple vitamins-iron) .... Take one tablet daily 4)  Folic Acid 1 Mg Tabs (Folic acid) .... Take one tablet daily 5)  Onetouch Ultra Blue Strp (Glucose blood) .... Test 1-2 times daily as directed 6)  Onetouch Lancets Misc (Lancets) .... Test 1-2 times daily 7)  Vicodin 5-500 Mg Tabs (Hydrocodone-acetaminophen) .Marland Kitchen.. 1 tab by mouth q4-6 as needed for severe pain 8)  Furosemide 20 Mg Tabs (Furosemide) .... Take 1 tablet by mouth daily.  may take 2nd tab as needed for swelling 9)  Fenofibrate 160 Mg Tabs (Fenofibrate) .... Take one tablet daily 10)  Simvastatin 20 Mg Tabs (Simvastatin) .Marland Kitchen.. 1 by mouth at bedtime 11)  Amlodipine Besylate 5 Mg Tabs (Amlodipine besylate) .Marland Kitchen.. 1 by mouth every day 12)  Calcium 750mg   .... Take one tablet daily 13)  Mobic 7.5 Mg Tabs (Meloxicam) .... As needed 14)  Benazepril Hcl 20 Mg Tabs (Benazepril hcl) .Marland Kitchen.. 1 by mouth daily 15)  Sertraline Hcl 50 Mg Tabs (Sertraline hcl) .... 1/2 tab by mouth daily x 2 weeks and then increase to 1 tab daily  Patient Instructions: 1)  Please schedule a follow-up appointment in 1 month to recheck mood. 2)  Start the Sertraline at dinner for your mood- this may make you sleepy. 3)  Please call and set up counseling 4)  Continue the Xanax as needed for anxiety 5)  Your sinus congestion is more allergy than infection- start the Zyrtec 6)  Call with any questions or concerns 7)  Hang in there! Prescriptions: SERTRALINE HCL 50 MG TABS (SERTRALINE HCL) 1/2 tab by mouth daily x 2 weeks and then increase to 1 tab daily  #30 x 3   Entered and Authorized by:   Neena Rhymes MD   Signed by:   Neena Rhymes MD on 10/02/2010   Method used:   Electronically to        [facility] [phone]* (retail)       [address]       [place], [place]  [address]       Ph: [phone]       Fax: [phone]   RxID:   [id number]    Orders Added: 1)  Est. Patient Level III  [02725] 2)  Prescription Created Electronically [id number]

## 2010-10-24 ENCOUNTER — Encounter: Payer: Self-pay | Admitting: Family Medicine

## 2010-10-24 ENCOUNTER — Ambulatory Visit (INDEPENDENT_AMBULATORY_CARE_PROVIDER_SITE_OTHER): Payer: Medicare Other | Admitting: Family Medicine

## 2010-10-24 DIAGNOSIS — R300 Dysuria: Secondary | ICD-10-CM | POA: Insufficient documentation

## 2010-10-24 DIAGNOSIS — F329 Major depressive disorder, single episode, unspecified: Secondary | ICD-10-CM

## 2010-10-24 LAB — CONVERTED CEMR LAB
Ketones, urine, test strip: NEGATIVE
Nitrite: NEGATIVE
Specific Gravity, Urine: 1.015
Urobilinogen, UA: 0.2
WBC Urine, dipstick: NEGATIVE
pH: 6

## 2010-10-25 NOTE — Consult Note (Signed)
Summary: [facility]  [facility]   Imported By: Maryln Gottron 10/17/2010 11:04:15  _____________________________________________________________________  External Attachment:    Type:   Image     Comment:   External Document

## 2010-10-26 ENCOUNTER — Ambulatory Visit (INDEPENDENT_AMBULATORY_CARE_PROVIDER_SITE_OTHER): Payer: Medicare Other | Admitting: Gynecology

## 2010-10-26 ENCOUNTER — Encounter: Payer: Self-pay | Admitting: Family Medicine

## 2010-10-26 DIAGNOSIS — B373 Candidiasis of vulva and vagina: Secondary | ICD-10-CM

## 2010-10-26 DIAGNOSIS — N898 Other specified noninflammatory disorders of vagina: Secondary | ICD-10-CM

## 2010-10-26 DIAGNOSIS — R82998 Other abnormal findings in urine: Secondary | ICD-10-CM

## 2010-10-30 ENCOUNTER — Telehealth (INDEPENDENT_AMBULATORY_CARE_PROVIDER_SITE_OTHER): Payer: Self-pay | Admitting: *Deleted

## 2010-10-31 NOTE — Assessment & Plan Note (Signed)
Summary: UTI/kn   Vital Signs:  Patient profile:   70 year old female Temp:     97.6 degrees F oral Pulse rate:   86 / minute BP sitting:   128 / 72  (left arm)  Vitals Entered By: Doristine Devoid CMA (October 24, 2010 11:48 AM) CC: UTI sx x3 days some dysuria and back pain    History of Present Illness: 70 yo woman here today for dysuria.  sxs started 2 days ago.  denies frequency, urgency, hesitancy.  has hx of yeast infxns.  was on suppressive Diflucan for 2 months but has since stopped.    depression- saw Dr Janee Morn at [facility] for therapy.  felt session was helpful.  has plans to f/u.  has had 1-2 'bad days' but 'most of the time i've been able to manage'.  tolerating SSRI w/out difficulty.  Current Medications (verified): 1)  Nexium 40 Mg Cpdr (Esomeprazole Magnesium) .... Once Daily As Needed 2)  Alprazolam 1 Mg Tabs (Alprazolam) .Marland Kitchen.. 1 By Mouth Qam; 2 By Mouth Qpm and Q4 As Needed 3)  Qc Daily Multivitamins/iron  Tabs (Multiple Vitamins-Iron) .... Take One Tablet Daily 4)  Folic Acid 1 Mg Tabs (Folic Acid) .... Take One Tablet Daily 5)  Onetouch Ultra Blue  Strp (Glucose Blood) .... Test 1-2 Times Daily As Directed 6)  Onetouch Lancets  Misc (Lancets) .... Test 1-2 Times Daily 7)  Vicodin 5-500 Mg Tabs (Hydrocodone-Acetaminophen) .Marland Kitchen.. 1 Tab By Mouth Q4-6 As Needed For Severe Pain 8)  Furosemide 20 Mg Tabs (Furosemide) .... Take 1 Tablet By Mouth Daily.  May Take 2nd Tab As Needed For Swelling 9)  Fenofibrate 160 Mg Tabs (Fenofibrate) .... Take One Tablet Daily 10)  Simvastatin 20 Mg Tabs (Simvastatin) .Marland Kitchen.. 1 By Mouth At Bedtime 11)  Amlodipine Besylate 5 Mg  Tabs (Amlodipine Besylate) .Marland Kitchen.. 1 By Mouth Every Day 12)  Calcium 750mg  .... Take One Tablet Daily 13)  Mobic 7.5 Mg Tabs (Meloxicam) .... As Needed 14)  Benazepril Hcl 20 Mg  Tabs (Benazepril Hcl) .Marland Kitchen.. 1 By Mouth Daily 15)  Sertraline Hcl 50 Mg Tabs (Sertraline Hcl) .... 1/2 Tab By Mouth Daily X 2 Weeks and Then  Increase To 1 Tab Daily  Allergies (verified): 1)  ! Pcn 2)  ! Amoxicillin (Amoxicillin) 3)  ! Celexa 4)  ! Zonegran (Zonisamide) 5)  ! Neurontin 6)  ! * Zonismide  Review of Systems      See HPI  Physical Exam  General:  Well-developed,well-nourished,in no acute distress; alert,appropriate and cooperative throughout examination Head:  Normocephalic and atraumatic without obvious abnormalities. No apparent alopecia or balding. Abdomen:  soft, NT/ND, no suprapubic or CVA tenderness Psych:  no tears today, still anxious   Impression & Recommendations:  Problem # 1:  DYSURIA (ICD-788.1) Assessment New no evidence of UTI on UA- will send urine for cx.  start tx for recurrent yeast as this is most likely.  pt to call Dr Audie Box as he requested. Orders: UA Dipstick w/o Micro (manual) (16109) Specimen Handling (99000) T-Culture, Urine ([id number])  Problem # 2:  DEPRESSIVE DISORDER (ICD-311) Assessment: Unchanged applauded pt for starting counseling.  tolerating SSRI w/out difficulty.  will continue to follow. Her updated medication list for this problem includes:    Alprazolam 1 Mg Tabs (Alprazolam) .Marland Kitchen... 1 by mouth qam; 2 by mouth qpm and q4 as needed    Sertraline Hcl 50 Mg Tabs (Sertraline hcl) .Marland Kitchen... 1/2 tab by mouth daily x 2 weeks and  then increase to 1 tab daily  Complete Medication List: 1)  Nexium 40 Mg Cpdr (Esomeprazole magnesium) .... Once daily as needed 2)  Alprazolam 1 Mg Tabs (Alprazolam) .Marland Kitchen.. 1 by mouth qam; 2 by mouth qpm and q4 as needed 3)  Qc Daily Multivitamins/iron Tabs (Multiple vitamins-iron) .... Take one tablet daily 4)  Folic Acid 1 Mg Tabs (Folic acid) .... Take one tablet daily 5)  Onetouch Ultra Blue Strp (Glucose blood) .... Test 1-2 times daily as directed 6)  Onetouch Lancets Misc (Lancets) .... Test 1-2 times daily 7)  Vicodin 5-500 Mg Tabs (Hydrocodone-acetaminophen) .Marland Kitchen.. 1 tab by mouth q4-6 as needed for severe pain 8)  Furosemide 20 Mg  Tabs (Furosemide) .... Take 1 tablet by mouth daily.  may take 2nd tab as needed for swelling 9)  Fenofibrate 160 Mg Tabs (Fenofibrate) .... Take one tablet daily 10)  Simvastatin 20 Mg Tabs (Simvastatin) .Marland Kitchen.. 1 by mouth at bedtime 11)  Amlodipine Besylate 5 Mg Tabs (Amlodipine besylate) .Marland Kitchen.. 1 by mouth every day 12)  Calcium 750mg   .... Take one tablet daily 13)  Mobic 7.5 Mg Tabs (Meloxicam) .... As needed 14)  Benazepril Hcl 20 Mg Tabs (Benazepril hcl) .Marland Kitchen.. 1 by mouth daily 15)  Sertraline Hcl 50 Mg Tabs (Sertraline hcl) .... 1/2 tab by mouth daily x 2 weeks and then increase to 1 tab daily 16)  Diflucan 150 Mg Tabs (Fluconazole) .... Once daily.  may repeat in 3 days if sxs persist.  Patient Instructions: 1)  Please call Dr Audie Box and let him know about the yeast 2)  We'll send the urine for culture but I don't think it's an infection 3)  Take the Diflucan as directed 4)  Continue the Sertraline 5)  I'm proud of you for going to counseling! 6)  Hang in there!!! Prescriptions: DIFLUCAN 150 MG TABS (FLUCONAZOLE) once daily.  may repeat in 3 days if sxs persist.  #2 x 0   Entered and Authorized by:   Neena Rhymes MD   Signed by:   Neena Rhymes MD on 10/24/2010   Method used:   Reprint   RxID:   [id number] DIFLUCAN 150 MG TABS (FLUCONAZOLE) once daily.  may repeat in 3 days if sxs persist.  #2 x 0   Entered and Authorized by:   Neena Rhymes MD   Signed by:   Neena Rhymes MD on 10/24/2010   Method used:   Electronically to        [facility] [phone]* (retail)       [address]       [place], [place]  [address]       Ph: [phone]       Fax: [phone]   RxID:   [id number]    Orders Added: 1)  UA Dipstick w/o Micro (manual) [81002] 2)  Specimen Handling [99000] 3)  T-Culture, Urine [[id number]] 4)  Est. Patient Level III [87564]    Laboratory Results   Urine Tests    Routine Urinalysis   Glucose: negative    (Normal Range: Negative) Bilirubin: negative   (Normal Range: Negative) Ketone: negative   (Normal Range: Negative) Spec. Gravity: 1.015   (Normal Range: 1.003-1.035) Blood: negative   (Normal Range: Negative) pH: 6.0   (Normal Range: 5.0-8.0) Protein: negative   (Normal Range: Negative) Urobilinogen: 0.2   (Normal Range: 0-1) Nitrite: negative   (Normal Range: Negative) Leukocyte Esterace: negative   (Normal Range:  Negative)

## 2010-11-01 ENCOUNTER — Ambulatory Visit: Payer: Self-pay | Admitting: Family Medicine

## 2010-11-07 ENCOUNTER — Encounter: Payer: Self-pay | Admitting: Family Medicine

## 2010-11-08 ENCOUNTER — Ambulatory Visit (INDEPENDENT_AMBULATORY_CARE_PROVIDER_SITE_OTHER): Payer: Medicare Other | Admitting: Family Medicine

## 2010-11-08 ENCOUNTER — Encounter: Payer: Self-pay | Admitting: Family Medicine

## 2010-11-08 DIAGNOSIS — F3289 Other specified depressive episodes: Secondary | ICD-10-CM

## 2010-11-08 DIAGNOSIS — F329 Major depressive disorder, single episode, unspecified: Secondary | ICD-10-CM

## 2010-11-09 ENCOUNTER — Encounter: Payer: Self-pay | Admitting: Family Medicine

## 2010-11-09 NOTE — Progress Notes (Signed)
Summary: urine cx  Phone Note Outgoing Call   Call placed by: Doristine Devoid CMA,  October 30, 2010 9:41 AM Call placed to: Patient Summary of Call: has few colonies of bacteria growing in urine- not enough to be causing her sxs.  but given her history of sepsis will start Cipro 500mg  two times a day x7 days.  Follow-up for Phone Call        spoke w/ patient aware of urine cx and prescription ....Marland KitchenMarland KitchenDoristine Devoid CMA  October 30, 2010 9:43 AM     New/Updated Medications: CIPROFLOXACIN HCL 500 MG TABS (CIPROFLOXACIN HCL) take one tablet two times a day x7 Prescriptions: CIPROFLOXACIN HCL 500 MG TABS (CIPROFLOXACIN HCL) take one tablet two times a day x7  #14 x 0   Entered by:   Doristine Devoid CMA   Authorized by:   Neena Rhymes MD   Signed by:   Doristine Devoid CMA on 10/30/2010   Method used:   Electronically to        [facility] [phone]* (retail)       [address]       [place], [place]  [address]       Ph: [phone]       Fax: [phone]   RxID:   [id number]

## 2010-11-09 NOTE — Letter (Signed)
Summary: [facility]  [facility]   Imported By: Maryln Gottron 10/31/2010 10:24:05  _____________________________________________________________________  External Attachment:    Type:   Image     Comment:   External Document

## 2010-11-21 LAB — POCT I-STAT, CHEM 8
HCT: 35 % — ABNORMAL LOW (ref 36.0–46.0)
Hemoglobin: 11.9 g/dL — ABNORMAL LOW (ref 12.0–15.0)
Potassium: 3.3 mEq/L — ABNORMAL LOW (ref 3.5–5.1)
Sodium: 142 mEq/L (ref 135–145)
TCO2: 25 mmol/L (ref 0–100)

## 2010-11-21 LAB — DIFFERENTIAL
Basophils Absolute: 0 10*3/uL (ref 0.0–0.1)
Basophils Relative: 0 % (ref 0–1)
Eosinophils Absolute: 0.1 10*3/uL (ref 0.0–0.7)
Eosinophils Absolute: 0 10*3/uL (ref 0.0–0.7)
Eosinophils Absolute: 0.1 10*3/uL (ref 0.0–0.7)
Eosinophils Relative: 0 % (ref 0–5)
Eosinophils Relative: 0 % (ref 0–5)
Lymphocytes Relative: 12 % (ref 12–46)
Lymphocytes Relative: 13 % (ref 12–46)
Lymphs Abs: 0.5 10*3/uL — ABNORMAL LOW (ref 0.7–4.0)
Lymphs Abs: 1.7 10*3/uL (ref 0.7–4.0)
Lymphs Abs: 2 10*3/uL (ref 0.7–4.0)
Lymphs Abs: 2.1 10*3/uL (ref 0.7–4.0)
Monocytes Absolute: 0.8 10*3/uL (ref 0.1–1.0)
Monocytes Absolute: 0 10*3/uL — ABNORMAL LOW (ref 0.1–1.0)
Monocytes Relative: 6 % (ref 3–12)
Monocytes Relative: 5 % (ref 3–12)
Monocytes Relative: 6 % (ref 3–12)
Neutro Abs: 12.1 10*3/uL — ABNORMAL HIGH (ref 1.7–7.7)
Neutro Abs: 17.5 10*3/uL — ABNORMAL HIGH (ref 1.7–7.7)
Neutrophils Relative %: 82 % — ABNORMAL HIGH (ref 43–77)

## 2010-11-21 LAB — COMPREHENSIVE METABOLIC PANEL
ALT: 35 U/L (ref 0–35)
ALT: 40 U/L — ABNORMAL HIGH (ref 0–35)
AST: 31 U/L (ref 0–37)
Albumin: 2 g/dL — ABNORMAL LOW (ref 3.5–5.2)
Alkaline Phosphatase: 184 U/L — ABNORMAL HIGH (ref 39–117)
BUN: 23 mg/dL (ref 6–23)
CO2: 23 mEq/L (ref 19–32)
CO2: 30 mEq/L (ref 19–32)
Calcium: 8.6 mg/dL (ref 8.4–10.5)
Calcium: 8.6 mg/dL (ref 8.4–10.5)
Chloride: 107 mEq/L (ref 96–112)
Creatinine, Ser: 0.63 mg/dL (ref 0.4–1.2)
Creatinine, Ser: 0.64 mg/dL (ref 0.4–1.2)
GFR calc Af Amer: >60 mL/min (ref >60)
GFR calc non Af Amer: 59 mL/min — ABNORMAL LOW (ref >60)
GFR calc non Af Amer: >60 mL/min (ref >60)
Glucose, Bld: 111 mg/dL — ABNORMAL HIGH (ref 70–99)
Glucose, Bld: 131 mg/dL — ABNORMAL HIGH (ref 70–99)
Potassium: 3.2 mEq/L — ABNORMAL LOW (ref 3.5–5.1)
Sodium: 139 mEq/L (ref 135–145)
Sodium: 139 mEq/L (ref 135–145)
Sodium: 139 mEq/L (ref 135–145)
Total Bilirubin: 0.7 mg/dL (ref 0.3–1.2)
Total Protein: 6.1 g/dL (ref 6.0–8.3)
Total Protein: 6.2 g/dL (ref 6.0–8.3)

## 2010-11-21 LAB — URINE CULTURE
Colony Count: NO GROWTH
Culture: NO GROWTH

## 2010-11-21 LAB — BASIC METABOLIC PANEL
BUN: 6 mg/dL (ref 6–23)
BUN: 4 mg/dL — ABNORMAL LOW (ref 6–23)
BUN: 5 mg/dL — ABNORMAL LOW (ref 6–23)
BUN: 8 mg/dL (ref 6–23)
Calcium: 7.9 mg/dL — ABNORMAL LOW (ref 8.4–10.5)
Creatinine, Ser: 0.58 mg/dL (ref 0.4–1.2)
Creatinine, Ser: 0.64 mg/dL (ref 0.4–1.2)
Creatinine, Ser: 0.64 mg/dL (ref 0.4–1.2)
Creatinine, Ser: 0.67 mg/dL (ref 0.4–1.2)
GFR calc non Af Amer: >60 mL/min (ref >60)
GFR calc non Af Amer: >60 mL/min (ref >60)
GFR calc non Af Amer: >60 mL/min (ref >60)
GFR calc non Af Amer: >60 mL/min (ref >60)
Potassium: 3.7 mEq/L (ref 3.5–5.1)

## 2010-11-21 LAB — CBC
HCT: 30.2 % — ABNORMAL LOW (ref 36.0–46.0)
HCT: 31.6 % — ABNORMAL LOW (ref 36.0–46.0)
HCT: 34.5 % — ABNORMAL LOW (ref 36.0–46.0)
Hemoglobin: 9.9 g/dL — ABNORMAL LOW (ref 12.0–15.0)
Hemoglobin: 10.2 g/dL — ABNORMAL LOW (ref 12.0–15.0)
Hemoglobin: 11.4 g/dL — ABNORMAL LOW (ref 12.0–15.0)
MCHC: 32.9 g/dL (ref 30.0–36.0)
MCHC: 33.2 g/dL (ref 30.0–36.0)
MCHC: 32.6 g/dL (ref 30.0–36.0)
MCHC: 32.9 g/dL (ref 30.0–36.0)
MCHC: 33 g/dL (ref 30.0–36.0)
MCV: 88.7 fL (ref 78.0–100.0)
MCV: 88.4 fL (ref 78.0–100.0)
MCV: 88.9 fL (ref 78.0–100.0)
MCV: 89.1 fL (ref 78.0–100.0)
MCV: 89.9 fL (ref 78.0–100.0)
Platelets: 551 10*3/uL — ABNORMAL HIGH (ref 150–400)
Platelets: 486 10*3/uL — ABNORMAL HIGH (ref 150–400)
Platelets: 533 10*3/uL — ABNORMAL HIGH (ref 150–400)
Platelets: 538 10*3/uL — ABNORMAL HIGH (ref 150–400)
Platelets: 587 10*3/uL — ABNORMAL HIGH (ref 150–400)
RBC: 3.35 MIL/uL — ABNORMAL LOW (ref 3.87–5.11)
RBC: 3.49 MIL/uL — ABNORMAL LOW (ref 3.87–5.11)
RBC: 3.53 MIL/uL — ABNORMAL LOW (ref 3.87–5.11)
RBC: 3.89 MIL/uL (ref 3.87–5.11)
RDW: 16.7 % — ABNORMAL HIGH (ref 11.5–15.5)
RDW: 16.3 % — ABNORMAL HIGH (ref 11.5–15.5)
RDW: 16.6 % — ABNORMAL HIGH (ref 11.5–15.5)
RDW: 16.7 % — ABNORMAL HIGH (ref 11.5–15.5)
RDW: 16.7 % — ABNORMAL HIGH (ref 11.5–15.5)
RDW: 16.9 % — ABNORMAL HIGH (ref 11.5–15.5)
WBC: 15.9 10*3/uL — ABNORMAL HIGH (ref 4.0–10.5)
WBC: 18 10*3/uL — ABNORMAL HIGH (ref 4.0–10.5)
WBC: 25.2 10*3/uL — ABNORMAL HIGH (ref 4.0–10.5)

## 2010-11-21 LAB — GLUCOSE, CAPILLARY
Glucose-Capillary: 125 mg/dL — ABNORMAL HIGH (ref 70–99)
Glucose-Capillary: 132 mg/dL — ABNORMAL HIGH (ref 70–99)
Glucose-Capillary: 153 mg/dL — ABNORMAL HIGH (ref 70–99)
Glucose-Capillary: 110 mg/dL — ABNORMAL HIGH (ref 70–99)
Glucose-Capillary: 113 mg/dL — ABNORMAL HIGH (ref 70–99)
Glucose-Capillary: 115 mg/dL — ABNORMAL HIGH (ref 70–99)
Glucose-Capillary: 116 mg/dL — ABNORMAL HIGH (ref 70–99)
Glucose-Capillary: 122 mg/dL — ABNORMAL HIGH (ref 70–99)
Glucose-Capillary: 123 mg/dL — ABNORMAL HIGH (ref 70–99)
Glucose-Capillary: 129 mg/dL — ABNORMAL HIGH (ref 70–99)
Glucose-Capillary: 132 mg/dL — ABNORMAL HIGH (ref 70–99)
Glucose-Capillary: 133 mg/dL — ABNORMAL HIGH (ref 70–99)
Glucose-Capillary: 136 mg/dL — ABNORMAL HIGH (ref 70–99)
Glucose-Capillary: 138 mg/dL — ABNORMAL HIGH (ref 70–99)
Glucose-Capillary: 144 mg/dL — ABNORMAL HIGH (ref 70–99)
Glucose-Capillary: 155 mg/dL — ABNORMAL HIGH (ref 70–99)

## 2010-11-21 LAB — POCT CARDIAC MARKERS
CKMB, poc: 1.1 ng/mL (ref 1.0–8.0)
Myoglobin, poc: 200 ng/mL (ref 12–200)
Troponin i, poc: <0.05 ng/mL (ref 0.00–0.09)

## 2010-11-21 LAB — BLOOD GAS, ARTERIAL
Drawn by: 307971
O2 Content: 4 L/min
pCO2 arterial: 35 mmHg (ref 35.0–45.0)
pH, Arterial: 7.432 — ABNORMAL HIGH (ref 7.350–7.400)
pO2, Arterial: 74.1 mmHg — ABNORMAL LOW (ref 80.0–100.0)

## 2010-11-21 LAB — ANA: Anti Nuclear Antibody(ANA): NEGATIVE

## 2010-11-21 LAB — CULTURE, BLOOD (ROUTINE X 2)

## 2010-11-21 LAB — CARDIAC PANEL(CRET KIN+CKTOT+MB+TROPI)
CK, MB: 2.4 ng/mL (ref 0.3–4.0)
CK, MB: 2.6 ng/mL (ref 0.3–4.0)
Relative Index: INVALID (ref 0.0–2.5)
Total CK: 16 U/L (ref 7–177)
Total CK: 8 U/L (ref 7–177)
Troponin I: 0.05 ng/mL (ref 0.00–0.06)
Troponin I: 0.06 ng/mL (ref 0.00–0.06)
Troponin I: 0.08 ng/mL — ABNORMAL HIGH (ref 0.00–0.06)

## 2010-11-21 LAB — BRAIN NATRIURETIC PEPTIDE: Pro B Natriuretic peptide (BNP): 726 pg/mL — ABNORMAL HIGH (ref 0.0–100.0)

## 2010-11-21 LAB — HIV ANTIBODY (ROUTINE TESTING W REFLEX): HIV: NONREACTIVE

## 2010-11-21 LAB — D-DIMER, QUANTITATIVE: D-Dimer, Quant: 1.37 ug/mL-FEU — ABNORMAL HIGH (ref 0.00–0.48)

## 2010-11-21 LAB — ANAEROBIC CULTURE

## 2010-11-21 LAB — HEPARIN LEVEL (UNFRACTIONATED)
Heparin Unfractionated: 0.66 IU/mL (ref 0.30–0.70)
Heparin Unfractionated: 0.23 IU/mL — ABNORMAL LOW (ref 0.30–0.70)
Heparin Unfractionated: 0.33 IU/mL (ref 0.30–0.70)
Heparin Unfractionated: <0.1 IU/mL — ABNORMAL LOW (ref 0.30–0.70)
Heparin Unfractionated: <0.1 IU/mL — ABNORMAL LOW (ref 0.30–0.70)

## 2010-11-21 LAB — LACTIC ACID, PLASMA: Lactic Acid, Venous: 1.3 mmol/L (ref 0.5–2.2)

## 2010-11-21 LAB — URINALYSIS, ROUTINE W REFLEX MICROSCOPIC
Bilirubin Urine: NEGATIVE
Glucose, UA: NEGATIVE mg/dL
Nitrite: NEGATIVE
Specific Gravity, Urine: 1.021 (ref 1.005–1.030)
pH: 5.5 (ref 5.0–8.0)

## 2010-11-21 LAB — PROTIME-INR
INR: 1.23 (ref 0.00–1.49)
INR: 1.33 (ref 0.00–1.49)
Prothrombin Time: 14.6 seconds (ref 11.6–15.2)

## 2010-11-21 LAB — HEPATIC FUNCTION PANEL
ALT: 32 U/L (ref 0–35)
Albumin: 1.9 g/dL — ABNORMAL LOW (ref 3.5–5.2)
Alkaline Phosphatase: 106 U/L (ref 39–117)
Total Bilirubin: 0.4 mg/dL (ref 0.3–1.2)

## 2010-11-21 LAB — CULTURE, ROUTINE-ABSCESS: Culture: NO GROWTH

## 2010-11-21 LAB — APTT
aPTT: 27 seconds (ref 24–37)
aPTT: 31 seconds (ref 24–37)

## 2010-11-21 LAB — MRSA PCR SCREENING: MRSA by PCR: NEGATIVE

## 2010-11-21 NOTE — Assessment & Plan Note (Signed)
Summary: discuss different med/cbs   Vital Signs:  Patient profile:   70 year old female Height:      63 inches (160.02 cm) Weight:      183 pounds (83.18 kg) BMI:     32.53 Temp:     97.8 degrees F (36.56 degrees C) oral BP sitting:   102 / 70  (left arm) Cuff size:   large  Vitals Entered By: Lucious Groves CMA (November 08, 2010 1:36 PM) CC: Discuss meds./kb Is Patient Diabetic? No Pain Assessment Patient in pain? no      Comments Discuss Sertraline and Alprazolam.   History of Present Illness: 70 yo woman here today to discuss depression/anxiety meds.  counselor recommended she come see me after visit yesterday.  'i don't like where we went yesterday'.  'i went off yesterday'.  has been having panic attacks all morning.  wonders if she needs a psychiatrist- counselor doesn't think so.  feels she is on appropriate meds, just needs to have them adjusted.  husband reports he wants 'a med to fix this'.  have reviewed the notes from counselor- he feels pt and husband have unrealistic expectations of how quickly and to what degree things will improve.  Current Medications (verified): 1)  Nexium 40 Mg Cpdr (Esomeprazole Magnesium) .... Once Daily As Needed 2)  Alprazolam 2 Mg Tabs (Alprazolam) .Marland Kitchen.. 1 Tab Three Times A Day As Needed For Anxiety 3)  Qc Daily Multivitamins/iron  Tabs (Multiple Vitamins-Iron) .... Take One Tablet Daily 4)  Folic Acid 1 Mg Tabs (Folic Acid) .... Take One Tablet Daily 5)  Onetouch Ultra Blue  Strp (Glucose Blood) .... Test 1-2 Times Daily As Directed 6)  Onetouch Lancets  Misc (Lancets) .... Test 1-2 Times Daily 7)  Vicodin 5-500 Mg Tabs (Hydrocodone-Acetaminophen) .Marland Kitchen.. 1 Tab By Mouth Q4-6 As Needed For Severe Pain 8)  Furosemide 20 Mg Tabs (Furosemide) .... Take 1 Tablet By Mouth Daily.  May Take 2nd Tab As Needed For Swelling 9)  Fenofibrate 160 Mg Tabs (Fenofibrate) .... Take One Tablet Daily 10)  Simvastatin 20 Mg Tabs (Simvastatin) .Marland Kitchen.. 1 By Mouth At  Bedtime 11)  Amlodipine Besylate 5 Mg  Tabs (Amlodipine Besylate) .Marland Kitchen.. 1 By Mouth Every Day 12)  Calcium 750mg  .... Take One Tablet Daily 13)  Mobic 7.5 Mg Tabs (Meloxicam) .... As Needed 14)  Benazepril Hcl 20 Mg  Tabs (Benazepril Hcl) .Marland Kitchen.. 1 By Mouth Daily 15)  Sertraline Hcl 100 Mg Tabs (Sertraline Hcl) .Marland Kitchen.. 1 Tab By Mouth Daily  Allergies (verified): 1)  ! Pcn 2)  ! Amoxicillin (Amoxicillin) 3)  ! Celexa 4)  ! Zonegran (Zonisamide) 5)  ! Neurontin 6)  ! * Zonismide  Physical Exam  General:  very anxious Psych:  no tears today, still very anxious   Impression & Recommendations:  Problem # 1:  DEPRESSIVE DISORDER (ICD-311) Assessment Deteriorated increase alprazolam and sertraline.  follow closely.  continue counseling. Her updated medication list for this problem includes:    Alprazolam 2 Mg Tabs (Alprazolam) .Marland Kitchen... 1 tab three times a day as needed for anxiety    Sertraline Hcl 100 Mg Tabs (Sertraline hcl) .Marland Kitchen... 1 tab by mouth daily  Complete Medication List: 1)  Nexium 40 Mg Cpdr (Esomeprazole magnesium) .... Once daily as needed 2)  Alprazolam 2 Mg Tabs (Alprazolam) .Marland Kitchen.. 1 tab three times a day as needed for anxiety 3)  Qc Daily Multivitamins/iron Tabs (Multiple vitamins-iron) .... Take one tablet daily 4)  Folic Acid  1 Mg Tabs (Folic acid) .... Take one tablet daily 5)  Onetouch Ultra Blue Strp (Glucose blood) .... Test 1-2 times daily as directed 6)  Onetouch Lancets Misc (Lancets) .... Test 1-2 times daily 7)  Vicodin 5-500 Mg Tabs (Hydrocodone-acetaminophen) .Marland Kitchen.. 1 tab by mouth q4-6 as needed for severe pain 8)  Furosemide 20 Mg Tabs (Furosemide) .... Take 1 tablet by mouth daily.  may take 2nd tab as needed for swelling 9)  Fenofibrate 160 Mg Tabs (Fenofibrate) .... Take one tablet daily 10)  Simvastatin 20 Mg Tabs (Simvastatin) .Marland Kitchen.. 1 by mouth at bedtime 11)  Amlodipine Besylate 5 Mg Tabs (Amlodipine besylate) .Marland Kitchen.. 1 by mouth every day 12)  Calcium 750mg   ....  Take one tablet daily 13)  Mobic 7.5 Mg Tabs (Meloxicam) .... As needed 14)  Benazepril Hcl 20 Mg Tabs (Benazepril hcl) .Marland Kitchen.. 1 by mouth daily 15)  Sertraline Hcl 100 Mg Tabs (Sertraline hcl) .Marland Kitchen.. 1 tab by mouth daily  Patient Instructions: 1)  Follow up in 1 month to check mood 2)  Keep up the good work in therapy- it's not easy! 3)  Hang in there!!! Prescriptions: SERTRALINE HCL 100 MG TABS (SERTRALINE HCL) 1 tab by mouth daily  #30 x 3   Entered and Authorized by:   Neena Rhymes MD   Signed by:   Neena Rhymes MD on 11/08/2010   Method used:   Print then Give to Patient   RxID:   [id number] ALPRAZOLAM 2 MG TABS (ALPRAZOLAM) 1 tab three times a day as needed for anxiety  #90 x 1   Entered and Authorized by:   Neena Rhymes MD   Signed by:   Neena Rhymes MD on 11/08/2010   Method used:   Print then Give to Patient   RxID:   [id number]    Orders Added: 1)  Est. Patient Level III [95284]

## 2010-11-21 NOTE — Letter (Signed)
Summary: [facility]  [facility]   Imported By: Maryln Gottron 11/15/2010 13:47:35  _____________________________________________________________________  External Attachment:    Type:   Image     Comment:   External Document

## 2010-12-07 ENCOUNTER — Encounter: Payer: Self-pay | Admitting: Family Medicine

## 2010-12-07 ENCOUNTER — Ambulatory Visit (INDEPENDENT_AMBULATORY_CARE_PROVIDER_SITE_OTHER): Payer: Medicare Other | Admitting: Family Medicine

## 2010-12-07 ENCOUNTER — Encounter: Payer: Self-pay | Admitting: *Deleted

## 2010-12-07 DIAGNOSIS — I10 Essential (primary) hypertension: Secondary | ICD-10-CM

## 2010-12-07 DIAGNOSIS — E119 Type 2 diabetes mellitus without complications: Secondary | ICD-10-CM

## 2010-12-07 DIAGNOSIS — E785 Hyperlipidemia, unspecified: Secondary | ICD-10-CM

## 2010-12-07 LAB — HEPATIC FUNCTION PANEL
Albumin: 4 g/dL (ref 3.5–5.2)
Alkaline Phosphatase: 49 U/L (ref 39–117)
Total Bilirubin: 0.3 mg/dL (ref 0.3–1.2)

## 2010-12-07 LAB — BASIC METABOLIC PANEL
Calcium: 9.8 mg/dL (ref 8.4–10.5)
GFR: 75.22 mL/min (ref >60.00)
Glucose, Bld: 85 mg/dL (ref 70–99)
Sodium: 144 mEq/L (ref 135–145)

## 2010-12-07 LAB — LIPID PANEL
Cholesterol: 158 mg/dL (ref 0–200)
HDL: 46.6 mg/dL (ref >39.00)
LDL Cholesterol: 88 mg/dL (ref 0–99)
VLDL: 23.4 mg/dL (ref 0.0–40.0)

## 2010-12-07 LAB — HEMOGLOBIN A1C: Hgb A1c MFr Bld: 7 % — ABNORMAL HIGH (ref 4.6–6.5)

## 2010-12-07 NOTE — Assessment & Plan Note (Signed)
She has been doing an excellent job controlling sugars w/ diet and exercise.  Will check A1C and determine if she needs meds but based on CBGs would doubt it.  Applauded efforts.  Will follow.

## 2010-12-07 NOTE — Patient Instructions (Signed)
Follow up in 3 months to recheck your diabetes We'll notify you of your lab results You look great!  Keep up the good work! Call with any questions or concerns Happy Odis Luster!

## 2010-12-07 NOTE — Assessment & Plan Note (Signed)
Excellent control today.  Asymptomatic.  No changes.

## 2010-12-07 NOTE — Assessment & Plan Note (Signed)
Tolerating statin w/out difficulty.  Check labs.  Adjust meds prn. 

## 2010-12-07 NOTE — Progress Notes (Signed)
  Subjective:    Patient ID: Sarah Mcguire, female    DOB: 1940-12-26, 70 y.o.   MRN: [id number]  HPI DM- CBGs running 80-130 but most between 90-110.  No symptomatic lows.  Controlling w/ diet and exercise- not on meds, never has been.  No CP, SOB, HAs, visual changes, N/V, edema.  HTN- chronic problem, well controlled on Amlodipine and Benazepril.  Asymptomatic- see above.  Hyperlipidemia- on Simvastatin (taking in the AM) and fenofibrate.  Due for labs.  No abd pain, myalgias.   Review of Systems For ROS see HPI   Past medical, surgical, family, and social hx reviewed for relevance and changes     Objective:   Physical Exam  Constitutional: She is oriented to person, place, and time. She appears well-developed and well-nourished. No distress.  HENT:  Head: Normocephalic and atraumatic.  Eyes: Conjunctivae and EOM are normal. Pupils are equal, round, and reactive to light.  Neck: Normal range of motion. Neck supple. No thyromegaly present.  Cardiovascular: Normal rate, regular rhythm, normal heart sounds and intact distal pulses.   No murmur heard. Pulmonary/Chest: Effort normal and breath sounds normal. No respiratory distress. She has no wheezes.  Abdominal: Soft. Bowel sounds are normal. She exhibits no distension. There is no tenderness.  Musculoskeletal: She exhibits no edema.  Lymphadenopathy:    She has no cervical adenopathy.  Neurological: She is alert and oriented to person, place, and time.  Skin: Skin is warm and dry.  Psychiatric: She has a normal mood and affect. Her behavior is normal. Judgment and thought content normal.          Assessment & Plan:

## 2010-12-13 ENCOUNTER — Ambulatory Visit (INDEPENDENT_AMBULATORY_CARE_PROVIDER_SITE_OTHER): Payer: Medicare Other | Admitting: Family Medicine

## 2010-12-13 VITALS — BP 118/84 | Temp 99.0°F | Wt 181.1 lb

## 2010-12-13 DIAGNOSIS — K5792 Diverticulitis of intestine, part unspecified, without perforation or abscess without bleeding: Secondary | ICD-10-CM

## 2010-12-13 DIAGNOSIS — K5732 Diverticulitis of large intestine without perforation or abscess without bleeding: Secondary | ICD-10-CM

## 2010-12-13 DIAGNOSIS — N390 Urinary tract infection, site not specified: Secondary | ICD-10-CM

## 2010-12-13 DIAGNOSIS — R109 Unspecified abdominal pain: Secondary | ICD-10-CM

## 2010-12-13 LAB — POCT URINALYSIS DIPSTICK
Bilirubin, UA: NEGATIVE
Ketones, UA: NEGATIVE

## 2010-12-13 MED ORDER — FLUCONAZOLE 100 MG PO TABS: 100.0000 mg | ORAL_TABLET | Freq: Every day | ORAL | Status: AC

## 2010-12-13 MED ORDER — METRONIDAZOLE 500 MG PO TABS: 500.0000 mg | ORAL_TABLET | Freq: Three times a day (TID) | ORAL | Status: AC

## 2010-12-13 MED ORDER — CIPROFLOXACIN HCL 500 MG PO TABS: 500.0000 mg | ORAL_TABLET | Freq: Two times a day (BID) | ORAL | Status: AC

## 2010-12-13 NOTE — Progress Notes (Signed)
  Subjective:    Patient ID: Sarah Mcguire, female    DOB: March 29, 1941, 70 y.o.   MRN: [id number]  HPI Abd pain- sxs started Sunday, ate nuts on Monday.  Has hx of diverticulitis/liver abscess.  Pain is lower abdominal/pelvic.  Denies nausea/vomiting/diarrhea.  No burning w/ urination.  No fevers.  Having panic attack b/c last year she ended up w/ liver abscess and was hospitalized w/ sepsis.   Review of Systems For ROS see HPI     Objective:   Physical Exam  Constitutional: She appears well-developed and well-nourished. She appears distressed.  Pulmonary/Chest: Effort normal and breath sounds normal. No respiratory distress. She has no wheezes.  Abdominal: Soft. Bowel sounds are normal. She exhibits no distension. There is no rebound and no guarding.       + suprapubic tenderness.  No TTP over lower quadrants  Skin: Skin is warm and dry.  Psychiatric: Her mood appears anxious.       shaking          Assessment & Plan:

## 2010-12-13 NOTE — Patient Instructions (Signed)
This appears to be a bladder infection- possibly diverticulitis Given your history, we're going to treat all of it Take the Cipro and Flagyl as directed Take the Diflucan if you develop yeast- STOP the cholesterol medicine if you take the yeast medicine Until your abdominal pain improves stick to a clear liquid and then bland diet Call with any questions or concerns If your pain worsens, please go to the ER Hang in there!

## 2010-12-18 ENCOUNTER — Ambulatory Visit: Payer: Medicare Other | Admitting: Family Medicine

## 2010-12-20 LAB — HM DIABETES FOOT EXAM: HM Diabetic Foot Exam: NORMAL

## 2010-12-20 NOTE — Assessment & Plan Note (Signed)
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  Reviewed supportive care and red flags that should prompt return.  Pt expressed understanding and is in agreement w/ plan.

## 2011-01-19 NOTE — Consult Note (Signed)
[place]. [facility]  Patient:    Sarah, Mcguire                        MRN: [id number] Proc. Date: 12/25/00 Adm. Date:  [id number] Attending:  Edwyna Perfect Dictator:   Karl Pock, M.D. CC:         Dr. Lowella Bandy  Griffith Citron, M.D.   Consultation Report  REFERRING PHYSICIAN:  Blanch Media, M.D., with teaching service.  REASON FOR CONSULTATION:  Bloody diarrhea.  HISTORY OF PRESENT ILLNESS:  This is a 70 year old Caucasian woman admitted earlier today with one-day history of nausea, vomiting, diarrhea.  Patient reports onset of nausea with three to four episodes of vomiting bilious material this a.m.  Most notably, she reports eight to nine watery stools, which have become bloody.  She reports crampy lower abdominal pain associated with the diarrhea, but no fevers or chills.  She felt well up until the onset of these events today.  She reports recent travel to the [facility], no over-the-counter NSAID use except daily aspirin.  She has had no similar episodes in the past.  PAST MEDICAL HISTORY:  Hypertension, hyperlipidemia, depression.  MEDICATIONS:  Prempro 0.25 mg daily, Zocor 40 mg daily, Lotrel 5 mg daily, Paxil 10 mg daily, aspirin 325 mg daily.  ALLERGIES:  No known drug allergies.  FAMILY HISTORY:  Mother with diabetes.  Father deceased at age 22, heart problems and abdominal aortic aneurysm.  One brother, who is healthy.  SOCIAL HISTORY:  Patient is married.  She lives in [place] with her husband.  Has two grown children, age 3 and 14.  She is a retired [employer] pilot. She smokes a half-pack a day for the past 30 years and denies alcohol use.  REVIEW OF SYSTEMS:  No weight changes, headaches, vision changes, dysphagia, odynophagia, chest pain, or shortness of breath.  No prior diarrhea, constipation, melena, or hematochezia.  No urinary symptoms.  No focal extremity weakness or numbness or tingling  .  PHYSICAL EXAMINATION:  VITAL SIGNS:  Temperature is 97.0, blood pressure 84/51, pulse 92, respirations 20.  GENERAL:  This is an ill-appearing middle-aged woman.  HEENT:  Atraumatic, normocephalic.  Pupils are equal, round and reactive to light and accommodation.  Nose and throat are clear.  She does have dry mucous membranes.  NECK:  No lymphadenopathy or thyromegaly.  CHEST:  Clear to auscultation bilaterally.  CARDIAC:  Tachycardic, regular rate and rhythm.  ABDOMEN:  Soft, nontender, nondistended, hyperactive bowel sounds.  No masses. No hepatosplenomegaly.  EXTREMITIES:  No clubbing, cyanosis, or edema.  NEUROLOGIC:  Alert and oriented x 3.  Cranial nerves II-XII grossly intact. Motor and sensory are intact.  LABORATORY DATA:  White blood cell count 22.7, hemoglobin 15.3, MCV 88.1, platelets 226,000, 90% neutrophils.  CMET:  Sodium 137, potassium 3.4, chloride 109, bicarb 21, BUN 16, creatinine 1.2, glucose 99, calcium 8.4, total protein 5.8, albumin 3.2, AST 29, ALT 16, alk phos 72, total bili 0.6. Amylase is 110, lipase 20.  Acute abdominal series revealed mildly dilated small bowel loops in the left upper quadrant.  ASSESSMENT AND PLAN:  This is a 70 year old woman with acute-onset nausea, vomiting, bloody diarrhea, with leukocytosis and probable small bowel ileus involving the left upper quadrant.  This most likely represents an episode of infectious colitis versus ischemic colitis.  Other considerations would include inflammatory bowel disease, diverticular disease, arteriovenous malformations, hemorrhoids, neoplasm.  I recommend checking stool  studies, which include routine Gram stain and culture, O&P, white blood cells, as well as antibiotic therapy with Cipro 500 mg q.12h.  Would do a close follow-up exam to include an abdominal CT versus colonoscopy.  This case was discussed with Dr. Kinnie Scales. DD:  12/25/00 TD:  12/26/00 Job: [id number]

## 2011-01-19 NOTE — Discharge Summary (Signed)
[place]. [facility]  Patient:    Sarah Mcguire, Sarah Mcguire                        MRN: [id number] Adm. Date:  [id number] Disc. Date: [id number] Attending:  Edwyna Perfect Dictator:   Blanch Media, M.D. CC:         Dr. Nedra Hai, fax to [phone]   Discharge Summary  DISCHARGE DIAGNOSES: 1. Colitis, infectious versus ischemic. 2. Volume depletion. 3. Hypokalemia secondary to gastrointestinal losses. 4. Sinus tachycardia. 5. 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 6. History of hypertension. 7. History of depression.  DISCHARGE MEDICATIONS: 1. Cipro 500 mg p.o. b.i.d. for eight days. 2. Prempro 2.5 mg p.o. q.d. 3. Zocor 40 mg p.o. q.h.s. 4. Lotrel 5/20 p.o. q.d. 5. Paxil 20 mg p.o. q.d.  HISTORY OF PRESENT ILLNESS:  Ms. Sarah Mcguire is a 70 year old female who was in her usual state of health until the evening prior to admission when she took two laxatives for constipation.  The patient woke up on the day of admission and almost immediately began having diarrhea, five to seven episodes, with the last two becoming bright red blood.  The patient has also had three to four episodes of nausea and vomiting.  There was no hematemesis.  The patient has had abdominal pain this morning associated with her bowel movements.  The patient ate at a restaurant last night, but no one else in her party got sick. The patient denies fever, chills, shortness of breath, chest pain.  The patient has also had syncopal episode witnessed by her husband lasting seconds and with a total of three to four episodes.  Per patient, she has been heme-negative in the past.  ER course:  The patients blood pressure in the field was 70/palpable, with a heart rate of 96.  CBG in the field was 142. The patient was given a 500-cc bolus by the EMS, and blood pressure responded to systolic 100, diastolic palpable.  Orthostatics in the ED were grossly positive as  the patient was unable to stand.  The patient has had multiple episodes of bright red blood per rectum in the ED, with the last episode being documented at 50 cc.  The patient was on her third liter of normal saline in the ED, and her blood pressure was 110/79 with a pulse of 105.  ADMISSION PHYSICAL EXAMINATION:  Vital signs:  Temperature 97.  Pertinent positives and negatives include dry mucous membranes.  Heart tachycardic. Lungs clear.  Abdomen benign.  ADMISSION LABORATORY DATA:  White blood cell count 22.7, hemoglobin 15.3, platelets 266, neutrophils 90%, lymphocytes 5%.  Sodium 137, potassium 3.4, ______ 109, CO2 21, BUN 16, creatinine 1.2, glucose 99, amylase 110, lipase 20, AST 29, ALT 16, alkaline phosphatase 72.  Acute abdominal series obtained in the ED showed mildly dilated left upper lobe small bowel suggestive of a partial small-bowel obstruction.  HOSPITAL COURSE:  The patient presented with bright red blood per rectum, leukocytosis, and x-ray suggestive of a partial small-bowel obstruction.  The differential on examination included diverticular disease, infectious colitis, and ischemic colitis.  As the patient had severe hypotension secondary to volume depletion, she was aggressively hydrated throughout her hospital stay. A CT of the abdomen was obtained on the evening of admission, which showed changes consistent with colitis and, due to the distribution from the mid transverse colon to the sigmoid colon, ischemic colitis was a definitive option.  The patient was started on Cipro in the ED to cover for possible enteric pathogens.  Stool was negative for white blood cells, and Grams stain showed a mixed flora.  The stool culture was still pending at the time of discharge.  On hospital day #1 the patient had significant improvement.  She felt much better, and her orthostatics were negative.  Her blood pressure had increased to 140/80.  She was still tachycardic.  She  still had a leukocytosis of 20.5.  Her stool changed from bright red blood to brown liquid on hospital day #2.  Her hemoglobin remained stable throughout the hospital stay and was 14.8 on the day of discharge.  As the patient was stable and with no further reason to stay in the hospital, the patient was discharged to home on December 27, 2000.  She is to complete a 10-day course of Cipro.  She also has a follow-up appointment with Dr. Nedra Hai on Monday, December 30, 2000.  She will need a CBC to monitor her hemoglobin level at this appointment.  DISCHARGE LABORATORY DATA:  White blood cell count left 21.5, hemoglobin 14.8. Potassium 2.9, BUN 23. DD:  12/27/00 TD:  12/30/00 Job: [id number]

## 2011-01-22 ENCOUNTER — Other Ambulatory Visit: Payer: Self-pay | Admitting: Family Medicine

## 2011-02-09 ENCOUNTER — Other Ambulatory Visit: Payer: Self-pay | Admitting: Family Medicine

## 2011-02-09 MED ORDER — ALPRAZOLAM 2 MG PO TABS: 2.0000 mg | ORAL_TABLET | Freq: Three times a day (TID) | ORAL | Status: DC | PRN

## 2011-02-09 NOTE — Telephone Encounter (Signed)
Pt note due for ov at this time. How many refills?

## 2011-02-09 NOTE — Telephone Encounter (Signed)
Hard copy faxed. 

## 2011-02-09 NOTE — Telephone Encounter (Signed)
Ok for #90, no refills 

## 2011-02-14 ENCOUNTER — Telehealth: Payer: Self-pay | Admitting: *Deleted

## 2011-02-14 NOTE — Telephone Encounter (Signed)
Discuss with patient  

## 2011-02-14 NOTE — Telephone Encounter (Signed)
There is no need to start a medicine for those values.  It is expected the blood sugar will increase while on steroids.  Short term elevations are nothing to worry about.  Her sugar should again improve as she weans off the steroids.

## 2011-02-14 NOTE — Telephone Encounter (Addendum)
Pt has real bad sinus infection again so ENT is going to put tubes in her ears again. The Pt is currently taking Prednisone 20 mg bid for 7 days then decrease to 10 mg bid for 8 day then 5mg  bid for 6 day along with levofloxacin 500 mg once a day. Pt is currently on her 4th day of the 20 mg regimen and only has 1 tab of levofloxacin left Pt notes that she is bordered line Diabetic and BS having been running high since starting med regiment. Pt states that BS are running 130-149. BS @ 11 pm last night was 149.Pt uses [facility].Please advise

## 2011-02-20 ENCOUNTER — Ambulatory Visit: Payer: Self-pay | Admitting: Otolaryngology

## 2011-03-01 ENCOUNTER — Ambulatory Visit (INDEPENDENT_AMBULATORY_CARE_PROVIDER_SITE_OTHER): Payer: Medicare Other | Admitting: Family Medicine

## 2011-03-01 VITALS — BP 100/70 | Temp 98.6°F | Wt 179.6 lb

## 2011-03-01 DIAGNOSIS — M722 Plantar fascial fibromatosis: Secondary | ICD-10-CM

## 2011-03-01 MED ORDER — HYDROCODONE-ACETAMINOPHEN 5-500 MG PO TABS: 1.0000 | ORAL_TABLET | Freq: Four times a day (QID) | ORAL | Status: DC | PRN

## 2011-03-01 MED ORDER — NAPROXEN 500 MG PO TABS: 500.0000 mg | ORAL_TABLET | Freq: Two times a day (BID) | ORAL | Status: AC

## 2011-03-01 NOTE — Progress Notes (Signed)
  Subjective:    Patient ID: Sarah Mcguire, female    DOB: Oct 17, 1940, 70 y.o.   MRN: [id number]  HPI L foot pain- sxs started yesterday.  Pain along lateral edge, extending across to plantar fascia.  Some swelling of feet.  Pain most noticeable w/ first step in the AM or after prolonged sitting.  Wearing sandals and flats daily.   Review of Systems For ROS see HPI     Objective:   Physical Exam  Constitutional: She appears well-developed and well-nourished. No distress.  Musculoskeletal: She exhibits edema (minimal swelling of L foot) and tenderness (insertion of plantar fascia on L).          Assessment & Plan:

## 2011-03-01 NOTE — Patient Instructions (Signed)
This is called plantar fasciitis- inflammation of the arch Start the Naprosyn twice daily for the next 7-10 days (Do not take the mobic while on this) Use the vicodin as needed for pain ICE 2-3x/day Wear good, supportive, cushioned shoes Use the Lasix as needed for swelling Hang in there!

## 2011-03-08 ENCOUNTER — Emergency Department (HOSPITAL_BASED_OUTPATIENT_CLINIC_OR_DEPARTMENT_OTHER)
Admission: EM | Admit: 2011-03-08 | Discharge: 2011-03-08 | Disposition: A | Discharge status: Home / Self Care | Payer: Medicare Other | Attending: Emergency Medicine | Admitting: Emergency Medicine

## 2011-03-08 DIAGNOSIS — Z79899 Other long term (current) drug therapy: Secondary | ICD-10-CM | POA: Insufficient documentation

## 2011-03-08 DIAGNOSIS — N390 Urinary tract infection, site not specified: Secondary | ICD-10-CM | POA: Insufficient documentation

## 2011-03-08 DIAGNOSIS — G8929 Other chronic pain: Secondary | ICD-10-CM | POA: Insufficient documentation

## 2011-03-08 DIAGNOSIS — R7309 Other abnormal glucose: Secondary | ICD-10-CM | POA: Insufficient documentation

## 2011-03-08 DIAGNOSIS — I10 Essential (primary) hypertension: Secondary | ICD-10-CM | POA: Insufficient documentation

## 2011-03-08 DIAGNOSIS — E7800 Pure hypercholesterolemia, unspecified: Secondary | ICD-10-CM | POA: Insufficient documentation

## 2011-03-08 DIAGNOSIS — M549 Dorsalgia, unspecified: Secondary | ICD-10-CM | POA: Insufficient documentation

## 2011-03-08 LAB — URINALYSIS, ROUTINE W REFLEX MICROSCOPIC
Glucose, UA: NEGATIVE mg/dL
Hgb urine dipstick: NEGATIVE
Protein, ur: NEGATIVE mg/dL
Specific Gravity, Urine: 1.02 (ref 1.005–1.030)

## 2011-03-08 LAB — BASIC METABOLIC PANEL
CO2: 25 mEq/L (ref 19–32)
Glucose, Bld: 162 mg/dL — ABNORMAL HIGH (ref 70–99)
Potassium: 3.8 mEq/L (ref 3.5–5.1)
Sodium: 141 mEq/L (ref 135–145)

## 2011-03-08 LAB — URINE MICROSCOPIC-ADD ON

## 2011-03-10 LAB — URINE CULTURE
Colony Count: 4000
Culture  Setup Time: 201207060656

## 2011-03-12 ENCOUNTER — Ambulatory Visit (INDEPENDENT_AMBULATORY_CARE_PROVIDER_SITE_OTHER): Payer: Medicare Other | Admitting: Family Medicine

## 2011-03-12 ENCOUNTER — Encounter: Payer: Self-pay | Admitting: Family Medicine

## 2011-03-12 DIAGNOSIS — E119 Type 2 diabetes mellitus without complications: Secondary | ICD-10-CM

## 2011-03-12 DIAGNOSIS — N390 Urinary tract infection, site not specified: Secondary | ICD-10-CM

## 2011-03-12 LAB — BASIC METABOLIC PANEL
BUN: 20 mg/dL (ref 6–23)
Creatinine, Ser: 0.9 mg/dL (ref 0.4–1.2)
GFR: 63.97 mL/min (ref >60.00)

## 2011-03-12 LAB — POCT URINALYSIS DIPSTICK
Bilirubin, UA: NEGATIVE
Ketones, UA: NEGATIVE
Leukocytes, UA: NEGATIVE
Nitrite, UA: NEGATIVE
Protein, UA: NEGATIVE

## 2011-03-12 LAB — CBC WITH DIFFERENTIAL/PLATELET
Basophils Absolute: 0 10*3/uL (ref 0.0–0.1)
Eosinophils Relative: 1.1 % (ref 0.0–5.0)
Monocytes Absolute: 1 10*3/uL (ref 0.1–1.0)
Monocytes Relative: 8.3 % (ref 3.0–12.0)
Neutrophils Relative %: 75.1 % (ref 43.0–77.0)
Platelets: 333 10*3/uL (ref 150.0–400.0)
RDW: 15.4 % — ABNORMAL HIGH (ref 11.5–14.6)
WBC: 12.3 10*3/uL — ABNORMAL HIGH (ref 4.5–10.5)

## 2011-03-12 MED ORDER — CIPROFLOXACIN HCL 500 MG PO TABS: 500.0000 mg | ORAL_TABLET | Freq: Two times a day (BID) | ORAL | Status: DC

## 2011-03-12 NOTE — Progress Notes (Signed)
  Subjective:    Patient ID: Sarah Mcguire, female    DOB: December 25, 1940, 70 y.o.   MRN: [id number]  HPI DM- chronic problem for pt, controlling w/ diet and exercise.  CBGs running 80-130.  Denies symptomatic lows, N/V, HAs, dizziness.  UTI- was seen in ER, started on Ceftin.  Reports Pyridium caused headaches.  Ceftin is reportedly not working- still having suprapubic pain/pressure.  Denies dysuria.  Is worried about possible diverticulitis (has hx of similar)   Review of Systems For ROS see HPI     Objective:   Physical Exam  Constitutional: She is oriented to person, place, and time. She appears well-developed and well-nourished. No distress.  HENT:  Head: Normocephalic and atraumatic.  Eyes: Conjunctivae and EOM are normal. Pupils are equal, round, and reactive to light.  Neck: Normal range of motion. Neck supple. No thyromegaly present.  Cardiovascular: Normal rate, regular rhythm, normal heart sounds and intact distal pulses.   No murmur heard. Pulmonary/Chest: Effort normal and breath sounds normal. No respiratory distress.  Abdominal: Soft. She exhibits no distension. There is tenderness (suprapubic tenderness). There is no rebound and no guarding.  Musculoskeletal: She exhibits no edema.  Lymphadenopathy:    She has no cervical adenopathy.  Neurological: She is alert and oriented to person, place, and time.  Skin: Skin is warm and dry.  Psychiatric: She has a normal mood and affect. Her behavior is normal.          Assessment & Plan:

## 2011-03-12 NOTE — Patient Instructions (Signed)
Follow up in 3 months to recheck diabetes and cholesterol STOP the Ceftin.  START the Cipro Drink plenty of fluids We'll notify you of your lab results Call with any questions or concerns Have a great summer!!!

## 2011-03-13 DIAGNOSIS — M722 Plantar fascial fibromatosis: Secondary | ICD-10-CM | POA: Insufficient documentation

## 2011-03-13 NOTE — Assessment & Plan Note (Signed)
Pt's sxs consistent w/ plantar fasciitis.  Start NSAIDs, ice, stretching.  Discussed the importance of good, supportive, cushioned shoes.  Reviewed supportive care and red flags that should prompt return.  Pt expressed understanding and is in agreement w/ plan.

## 2011-03-14 LAB — URINE CULTURE: Colony Count: NO GROWTH

## 2011-03-16 ENCOUNTER — Other Ambulatory Visit: Payer: Self-pay | Admitting: Family Medicine

## 2011-03-16 MED ORDER — ALPRAZOLAM 2 MG PO TABS: 2.0000 mg | ORAL_TABLET | Freq: Three times a day (TID) | ORAL | Status: DC | PRN

## 2011-03-16 NOTE — Telephone Encounter (Signed)
Last refilled on 02/09/11. Please advise.

## 2011-03-16 NOTE — Telephone Encounter (Signed)
Ok for 1 month w/ 3 refills

## 2011-03-16 NOTE — Telephone Encounter (Signed)
RX faxed

## 2011-03-19 ENCOUNTER — Ambulatory Visit (INDEPENDENT_AMBULATORY_CARE_PROVIDER_SITE_OTHER): Payer: Medicare Other | Admitting: Gynecology

## 2011-03-19 DIAGNOSIS — N898 Other specified noninflammatory disorders of vagina: Secondary | ICD-10-CM

## 2011-03-19 DIAGNOSIS — B373 Candidiasis of vulva and vagina: Secondary | ICD-10-CM

## 2011-03-19 DIAGNOSIS — N949 Unspecified condition associated with female genital organs and menstrual cycle: Secondary | ICD-10-CM

## 2011-03-19 NOTE — Assessment & Plan Note (Signed)
Will switch to Cipro as this will cover both UTI and possible diverticulitis which pt fears.  Have low suspicion of this due to benign abdominal exam.  Reviewed supportive care and red flags that should prompt return.  Pt expressed understanding and is in agreement w/ plan.

## 2011-03-19 NOTE — Assessment & Plan Note (Signed)
Check labs.  Adjust meds prn  

## 2011-03-26 ENCOUNTER — Ambulatory Visit (INDEPENDENT_AMBULATORY_CARE_PROVIDER_SITE_OTHER): Payer: Medicare Other | Admitting: Family Medicine

## 2011-03-26 DIAGNOSIS — R109 Unspecified abdominal pain: Secondary | ICD-10-CM

## 2011-03-26 LAB — POCT URINALYSIS DIPSTICK
Protein, UA: NEGATIVE
Spec Grav, UA: 1.005
Urobilinogen, UA: 0.2
pH, UA: 7.5

## 2011-03-26 NOTE — Patient Instructions (Signed)
Luster Landsberg will give you the details on your CT scan We'll notify you of your lab results Call with any questions or concerns Hang in there!

## 2011-03-26 NOTE — Progress Notes (Signed)
  Subjective:    Patient ID: Sarah Mcguire, female    DOB: Jan 02, 1941, 70 y.o.   MRN: [id number]  HPI Abdominal pain- suprapubic, went to GYN last week and other than yeast infxn was told that her 'female parts are fine'.  Husband reports pain is bad enough to require vicodin.  Pain is intermittent but 'it comes more than it goes'.  Denies urinary sxs, no nausea, vomiting.   Review of Systems For ROS see HPI     Objective:   Physical Exam  Vitals reviewed. Constitutional: She appears well-developed and well-nourished. No distress.       Doesn't appear uncomfortable.  Smiling, telling jokes.  Cardiovascular: Normal rate, regular rhythm and normal heart sounds.   Pulmonary/Chest: Effort normal and breath sounds normal. No respiratory distress. She has no wheezes. She has no rales.  Abdominal: Soft. Bowel sounds are normal. She exhibits no distension and no mass. There is tenderness (R pelvic/suprapubic area TTP, no hernia appreciated w/ bearing down). There is no rebound and no guarding.          Assessment & Plan:

## 2011-03-26 NOTE — Assessment & Plan Note (Signed)
Pt's pain is not GYN related according to recent GYN exam.  Not consistent w/ diverticulitis.  UA is normal- not consistent w/ UTI.  Area that is tender seems too low for appendix- and pt's sxs have been present for ~2 weeks w/out escalating.  Will check labs and order CT to assess.  Pt not in any current distress.  Reviewed red flags that should prompt immediate return.  Pt expressed understanding and is in agreement w/ plan.

## 2011-03-27 ENCOUNTER — Encounter: Payer: Self-pay | Admitting: *Deleted

## 2011-03-27 ENCOUNTER — Ambulatory Visit (INDEPENDENT_AMBULATORY_CARE_PROVIDER_SITE_OTHER)
Admission: RE | Admit: 2011-03-27 | Discharge: 2011-03-27 | Disposition: A | Discharge status: Home / Self Care | Payer: Medicare Other | Source: Ambulatory Visit | Attending: Family Medicine | Admitting: Family Medicine

## 2011-03-27 DIAGNOSIS — R109 Unspecified abdominal pain: Secondary | ICD-10-CM

## 2011-03-27 LAB — CBC WITH DIFFERENTIAL/PLATELET
Basophils Absolute: 0 10*3/uL (ref 0.0–0.1)
Eosinophils Absolute: 0.4 10*3/uL (ref 0.0–0.7)
HCT: 41.2 % (ref 36.0–46.0)
Lymphs Abs: 2.8 10*3/uL (ref 0.7–4.0)
MCHC: 33.1 g/dL (ref 30.0–36.0)
MCV: 88.9 fl (ref 78.0–100.0)
Monocytes Absolute: 0.5 10*3/uL (ref 0.1–1.0)
Neutrophils Relative %: 61.7 % (ref 43.0–77.0)
Platelets: 329 10*3/uL (ref 150.0–400.0)
RDW: 15.8 % — ABNORMAL HIGH (ref 11.5–14.6)
WBC: 9.9 10*3/uL (ref 4.5–10.5)

## 2011-03-27 LAB — BASIC METABOLIC PANEL
BUN: 25 mg/dL — ABNORMAL HIGH (ref 6–23)
Calcium: 10 mg/dL (ref 8.4–10.5)
Creatinine, Ser: 1 mg/dL (ref 0.4–1.2)
GFR: 57.43 mL/min — ABNORMAL LOW (ref >60.00)
Potassium: 5 mEq/L (ref 3.5–5.1)

## 2011-03-27 LAB — SEDIMENTATION RATE: Sed Rate: 35 mm/hr — ABNORMAL HIGH (ref 0–22)

## 2011-03-27 LAB — HEPATIC FUNCTION PANEL: Total Bilirubin: 0.1 mg/dL — ABNORMAL LOW (ref 0.3–1.2)

## 2011-03-27 MED ORDER — IOHEXOL 300 MG/ML  SOLN
100.0000 mL | Freq: Once | INTRAMUSCULAR | Status: AC | PRN
  Administered 2011-03-27: 100 mL via INTRAVENOUS

## 2011-03-27 MED ORDER — CIPROFLOXACIN-CIPROFLOX HCL ER 500 MG PO TB24: 500.0000 mg | ORAL_TABLET | Freq: Two times a day (BID) | ORAL | Status: AC

## 2011-03-27 MED ORDER — METRONIDAZOLE 500 MG PO TABS: 500.0000 mg | ORAL_TABLET | Freq: Three times a day (TID) | ORAL | Status: AC

## 2011-03-27 NOTE — Progress Notes (Signed)
Addended by: Lucious Groves I on: 03/27/2011 12:16 PM   Modules accepted: Orders

## 2011-03-28 ENCOUNTER — Encounter: Payer: Self-pay | Admitting: *Deleted

## 2011-04-09 ENCOUNTER — Telehealth: Payer: Self-pay

## 2011-04-09 MED ORDER — FLUCONAZOLE 150 MG PO TABS: ORAL_TABLET | ORAL | Status: DC

## 2011-04-09 NOTE — Telephone Encounter (Signed)
Diflucan 150 mg 1 by mouth daily for 3 days

## 2011-04-09 NOTE — Telephone Encounter (Signed)
Call from patient and she stated she just finished her ABX and has a yeast infection and would like Diflucan sent to [facility] on [address]. She stated it normally takes 3 pills to clear it up. Please advise     KP

## 2011-04-09 NOTE — Telephone Encounter (Signed)
Per Dr. Alwyn Ren ok to fill Rx...Marland KitchenMarland KitchenFaxed

## 2011-04-16 ENCOUNTER — Other Ambulatory Visit: Payer: Self-pay | Admitting: Internal Medicine

## 2011-04-24 ENCOUNTER — Encounter: Payer: Self-pay | Admitting: Gynecology

## 2011-04-24 ENCOUNTER — Ambulatory Visit (INDEPENDENT_AMBULATORY_CARE_PROVIDER_SITE_OTHER): Payer: Medicare Other | Admitting: Gynecology

## 2011-04-24 DIAGNOSIS — R82998 Other abnormal findings in urine: Secondary | ICD-10-CM

## 2011-04-24 DIAGNOSIS — N898 Other specified noninflammatory disorders of vagina: Secondary | ICD-10-CM

## 2011-04-24 DIAGNOSIS — N390 Urinary tract infection, site not specified: Secondary | ICD-10-CM

## 2011-04-24 DIAGNOSIS — N952 Postmenopausal atrophic vaginitis: Secondary | ICD-10-CM

## 2011-04-24 DIAGNOSIS — L293 Anogenital pruritus, unspecified: Secondary | ICD-10-CM

## 2011-04-24 DIAGNOSIS — B373 Candidiasis of vulva and vagina: Secondary | ICD-10-CM

## 2011-04-24 MED ORDER — FLUCONAZOLE 200 MG PO TABS: 200.0000 mg | ORAL_TABLET | Freq: Every day | ORAL | Status: AC

## 2011-04-24 NOTE — Progress Notes (Signed)
Patient presents complaining of vaginal discharge and itching period of the past month or so she's been on a number of antibiotics both for recurrent UTI and diverticulitis. She's been treated with intermittent Diflucan orally which take one pill wait a couple days and take another pill. She's had 2 courses of this and her itching and discharge has continued. She also tried over-the-counter Monistat and this has not helped. She has been seen in the past in our office for recurrent vaginitis and it seems that higher longer doses of Diflucan were required to eradicate her yeast.  Exam Pelvic: External BUS vagina was atrophic genital changes wet prep done, first to second-degree cystocele and rectocele noted. Cervix atrophic bimanual uterus difficult to palpate but midline mobile nontender, adnexa without masses or tenderness  Assessment and plan: Recurrent yeast vulvovaginitis. Wet prep does return positive for yeast. We'll treat with Diflucan 200 mg daily for 7 days. She can stop her simvastatin during this period of time. She'll call if her symptoms persist or recur.

## 2011-05-14 ENCOUNTER — Encounter: Payer: Self-pay | Admitting: Family Medicine

## 2011-05-14 ENCOUNTER — Ambulatory Visit (INDEPENDENT_AMBULATORY_CARE_PROVIDER_SITE_OTHER): Payer: Medicare Other | Admitting: Family Medicine

## 2011-05-14 DIAGNOSIS — I10 Essential (primary) hypertension: Secondary | ICD-10-CM

## 2011-05-14 DIAGNOSIS — E119 Type 2 diabetes mellitus without complications: Secondary | ICD-10-CM

## 2011-05-14 DIAGNOSIS — E785 Hyperlipidemia, unspecified: Secondary | ICD-10-CM

## 2011-05-14 LAB — HEMOGLOBIN A1C: Hgb A1c MFr Bld: 6.8 % — ABNORMAL HIGH (ref 4.6–6.5)

## 2011-05-14 NOTE — Patient Instructions (Signed)
Schedule your physical after Oct 5th [facility] notify you of your lab results and make any med changes if needed Call with any questions or concerns Happy Fall!!!

## 2011-05-14 NOTE — Progress Notes (Signed)
  Subjective:    Patient ID: Sarah Mcguire, female    DOB: 1940-11-27, 70 y.o.   MRN: [id number]  HPI Pt is here 1 month early b/c husband is on call for [employer] Duty and was told to clear calendar for 6 weeks.  DM- pt's CBGs running 80s-170s.  Has had numerous steroid injxns into back and feet.  CBGs are mostly 80s-110s w/ the few outliers coming after steroid injxns.  Denies symptomatic lows, CP, SOB, HAs, visual changes, edema.  Hyperlipidemia- chronic problem for pt, on Zocor.  Due for labs next month- will get them early b/c of scheduling issue caused by husband's upcoming jury duty  HTN- chronic problem for pt, excellent BP control.  On Amlodipine, Benazepril.  Denies CP, SOB, HAs, visual changes, edema.  Review of Systems For ROS see HPI     Objective:   Physical Exam  Constitutional: She is oriented to person, place, and time. She appears well-developed and well-nourished. No distress.  HENT:  Head: Normocephalic and atraumatic.  Eyes: Conjunctivae and EOM are normal. Pupils are equal, round, and reactive to light.  Neck: Normal range of motion. Neck supple. No thyromegaly present.  Cardiovascular: Normal rate, regular rhythm and intact distal pulses.   Pulmonary/Chest: Effort normal and breath sounds normal. No respiratory distress.  Abdominal: Soft. She exhibits no distension. There is no tenderness.  Musculoskeletal: She exhibits no edema.  Lymphadenopathy:    She has no cervical adenopathy.  Neurological: She is alert and oriented to person, place, and time.  Skin: Skin is warm and dry.  Psychiatric: She has a normal mood and affect. Her behavior is normal.          Assessment & Plan:

## 2011-05-15 LAB — HEPATIC FUNCTION PANEL
AST: 32 U/L (ref 0–37)
Alkaline Phosphatase: 43 U/L (ref 39–117)
Bilirubin, Direct: 0.1 mg/dL (ref 0.0–0.3)
Total Bilirubin: 0.4 mg/dL (ref 0.3–1.2)

## 2011-05-15 LAB — BASIC METABOLIC PANEL
BUN: 21 mg/dL (ref 6–23)
Calcium: 9.6 mg/dL (ref 8.4–10.5)
GFR: 75.13 mL/min (ref >60.00)
Glucose, Bld: 74 mg/dL (ref 70–99)

## 2011-05-16 ENCOUNTER — Encounter: Payer: Self-pay | Admitting: *Deleted

## 2011-05-17 ENCOUNTER — Encounter: Payer: Self-pay | Admitting: Family Medicine

## 2011-05-17 NOTE — Assessment & Plan Note (Signed)
Stable- excellently controlled today.  No changes.

## 2011-05-17 NOTE — Assessment & Plan Note (Signed)
Stable.  Tolerating statin w/out difficulty.  Check labs and adjust meds prn.

## 2011-05-17 NOTE — Assessment & Plan Note (Signed)
Stable.  Pt controlling w/ diet and exercise- has never been on meds.  Reassured her that her few random high CBGs coming after steroid injxns will not harm her long term.  Pt aware.  Check labs.  Start meds prn.

## 2011-05-23 LAB — URINALYSIS, MICROSCOPIC ONLY
Bilirubin Urine: NEGATIVE
Glucose, UA: NEGATIVE
Hgb urine dipstick: NEGATIVE
Protein, ur: NEGATIVE

## 2011-05-23 LAB — POCT URINALYSIS DIP (DEVICE)
Ketones, ur: NEGATIVE
Protein, ur: NEGATIVE
Specific Gravity, Urine: <=1.005
pH: 6

## 2011-05-23 LAB — URINE CULTURE

## 2011-05-23 LAB — I-STAT 8, (EC8 V) (CONVERTED LAB)
Bicarbonate: 23.5
Chloride: 107
HCT: 47 — ABNORMAL HIGH
Hemoglobin: 16 — ABNORMAL HIGH
Operator id: 247071
pCO2, Ven: 34.6 — ABNORMAL LOW

## 2011-05-23 LAB — DIFFERENTIAL
Basophils Absolute: 0.1
Basophils Relative: 1
Monocytes Absolute: 1.4 — ABNORMAL HIGH
Neutro Abs: 10.8 — ABNORMAL HIGH
Neutrophils Relative %: 73

## 2011-05-23 LAB — CBC
MCHC: 33.8
Platelets: 228
RDW: 15.5

## 2011-05-28 LAB — I-STAT 8, (EC8 V) (CONVERTED LAB)
Acid-Base Excess: 4 — ABNORMAL HIGH
TCO2: 31
pCO2, Ven: 50.4 — ABNORMAL HIGH
pH, Ven: 7.382 — ABNORMAL HIGH

## 2011-05-28 LAB — CBC
HCT: 44.6
MCV: 90
Platelets: 192
RDW: 15.3

## 2011-06-11 LAB — I-STAT 8, (EC8 V) (CONVERTED LAB)
BUN: 26 — ABNORMAL HIGH
Bicarbonate: 24.8 — ABNORMAL HIGH
Chloride: 106
Glucose, Bld: 159 — ABNORMAL HIGH
HCT: 49 — ABNORMAL HIGH
Hemoglobin: 16.7 — ABNORMAL HIGH
Operator id: 196461
Potassium: 4.7
Sodium: 137
TCO2: 26
pCO2, Ven: 39.4 — ABNORMAL LOW
pH, Ven: 7.407 — ABNORMAL HIGH

## 2011-06-11 LAB — URINE CULTURE

## 2011-06-11 LAB — CBC
Hemoglobin: 15.3 — ABNORMAL HIGH
MCHC: 34.2
MCV: 88.8
RBC: 5.02

## 2011-06-11 LAB — URINALYSIS, ROUTINE W REFLEX MICROSCOPIC
Hgb urine dipstick: NEGATIVE
Nitrite: NEGATIVE
Specific Gravity, Urine: 1.011
Urobilinogen, UA: 0.2

## 2011-06-11 LAB — DIFFERENTIAL
Basophils Relative: 0
Eosinophils Absolute: 0.1 — ABNORMAL LOW
Monocytes Absolute: 1.2 — ABNORMAL HIGH
Monocytes Relative: 7
Neutro Abs: 14.1 — ABNORMAL HIGH

## 2011-06-11 LAB — POCT I-STAT CREATININE
Creatinine, Ser: 1.2
Operator id: 196461

## 2011-06-11 LAB — URINE MICROSCOPIC-ADD ON

## 2011-06-12 ENCOUNTER — Encounter: Payer: Medicare Other | Admitting: Family Medicine

## 2011-07-09 ENCOUNTER — Ambulatory Visit (INDEPENDENT_AMBULATORY_CARE_PROVIDER_SITE_OTHER): Payer: Medicare Other | Admitting: Family Medicine

## 2011-07-09 ENCOUNTER — Encounter: Payer: Self-pay | Admitting: Family Medicine

## 2011-07-09 VITALS — BP 118/65 | HR 93 | Temp 98.0°F | Ht 63.0 in | Wt 180.8 lb

## 2011-07-09 DIAGNOSIS — E119 Type 2 diabetes mellitus without complications: Secondary | ICD-10-CM

## 2011-07-09 DIAGNOSIS — M810 Age-related osteoporosis without current pathological fracture: Secondary | ICD-10-CM | POA: Insufficient documentation

## 2011-07-09 DIAGNOSIS — Z0000 Encounter for general adult medical examination without abnormal findings: Secondary | ICD-10-CM

## 2011-07-09 DIAGNOSIS — M25559 Pain in unspecified hip: Secondary | ICD-10-CM

## 2011-07-09 DIAGNOSIS — Z23 Encounter for immunization: Secondary | ICD-10-CM

## 2011-07-09 LAB — BASIC METABOLIC PANEL
CO2: 27 mEq/L (ref 19–32)
Chloride: 104 mEq/L (ref 96–112)
Glucose, Bld: 86 mg/dL (ref 70–99)
Potassium: 4.1 mEq/L (ref 3.5–5.1)
Sodium: 142 mEq/L (ref 135–145)

## 2011-07-09 MED ORDER — HYDROCODONE-ACETAMINOPHEN 5-500 MG PO TABS
1.0000 | ORAL_TABLET | Freq: Four times a day (QID) | ORAL | Status: DC | PRN
Start: 2011-07-09 — End: 2011-08-15

## 2011-07-09 MED ORDER — ESOMEPRAZOLE MAGNESIUM 40 MG PO CPDR: 40.0000 mg | DELAYED_RELEASE_CAPSULE | Freq: Every day | ORAL | Status: DC | PRN

## 2011-07-09 NOTE — Progress Notes (Signed)
  Subjective:    Patient ID: Sarah Mcguire, female    DOB: 1940-12-16, 70 y.o.   MRN: [id number]  HPI Here today for CPE.  Risk Factors: DM- chronic problem, managing w/ diet and exercise.  Last A1C in early September.  No A1C today b/c it is too soon. Osteoporosis- had DEXA done w/ [facility], showed Osteoporosis.  Dr Renae Fickle recommended Reclast- needs labs done today. Hip pain- chronic problem, seeing [facility], needs refills on Mobic and Vicodin Physical Activity: as active as back allows Fall Risk: low risk Depression: ongoing issue, well controlled on xanax prn Hearing: normal to conversational tones and whispered voice ADL's: independent Cognitive: normal linear thought process, memory and attention intact Home Safety: safe at home, lives w/ husband Height, Weight, BMI, Visual Acuity: see vitals, vision corrected to 20/20 w/ glasses Counseling: UTD on colonoscopy, refusing mammo, pap.  Would like a flu shot. Labs Ordered: See A&P Care Plan: See A&P    Review of Systems Patient reports no vision/ hearing changes, adenopathy,fever, weight change,  persistant/recurrent hoarseness , swallowing issues, chest pain, palpitations, edema, persistant/recurrent cough, hemoptysis, dyspnea (rest/exertional/paroxysmal nocturnal), gastrointestinal bleeding (melena, rectal bleeding), abdominal pain, significant heartburn, bowel changes, GU symptoms (dysuria, hematuria, incontinence), Gyn symptoms (abnormal  bleeding, pain),  syncope, focal weakness, memory loss, numbness & tingling, skin/hair/nail changes, abnormal bruising or bleeding, anxiety, or depression.     Objective:   Physical Exam General Appearance:    Alert, cooperative, no distress, appears stated age  Head:    Normocephalic, without obvious abnormality, atraumatic  Eyes:    PERRL, conjunctiva/corneas clear, EOM's intact, fundi    benign, both eyes  Ears:    Normal TM's and external ear canals, both ears  Nose:   Nares  normal, septum midline, mucosa normal, no drainage    or sinus tenderness  Throat:   Lips, mucosa, and tongue normal; teeth and gums normal  Neck:   Supple, symmetrical, trachea midline, no adenopathy;    Thyroid: no enlargement/tenderness/nodules  Back:     Symmetric, no curvature, ROM normal, no CVA tenderness  Lungs:     Clear to auscultation bilaterally, respirations unlabored  Chest Wall:    No tenderness or deformity   Heart:    Regular rate and rhythm, S1 and S2 normal, no murmur, rub   or gallop  Breast Exam:    Deferred at pt's request  Abdomen:     Soft, non-tender, bowel sounds active all four quadrants,    no masses, no organomegaly  Genitalia:    Deferred at pt's request  Rectal:    Extremities:   Extremities normal, atraumatic, no cyanosis or edema  Pulses:   2+ and symmetric all extremities  Skin:   Skin color, texture, turgor normal, no rashes or lesions  Lymph nodes:   Cervical, supraclavicular, and axillary nodes normal  Neurologic:   CNII-XII intact, normal strength, sensation and reflexes    throughout          Assessment & Plan:

## 2011-07-09 NOTE — Patient Instructions (Signed)
Follow up in 3 months to recheck diabetes and cholesterol- don't eat before this You look great!  Keep up the good work! Call with any questions or concerns Happy Holidays!!

## 2011-07-16 ENCOUNTER — Other Ambulatory Visit: Payer: Self-pay | Admitting: Family Medicine

## 2011-07-17 NOTE — Telephone Encounter (Signed)
Last OV 07-09-11 last refill 03-16-11

## 2011-07-20 ENCOUNTER — Telehealth: Payer: Self-pay | Admitting: *Deleted

## 2011-07-20 ENCOUNTER — Other Ambulatory Visit: Payer: Self-pay | Admitting: Family Medicine

## 2011-07-20 MED ORDER — FLUCONAZOLE 200 MG PO TABS: 200.0000 mg | ORAL_TABLET | Freq: Every day | ORAL | Status: AC

## 2011-07-20 NOTE — Telephone Encounter (Signed)
Pt called c/o yeast infection this am. Itching no white discharge, she tried OTC monistat but no relief she leaving town on Monday for the holidays. Pt was last office visit was august 21,2012 please advise if pt can have Rx.

## 2011-07-20 NOTE — Telephone Encounter (Signed)
Medication has already been sent with refills on 07-16-11

## 2011-07-20 NOTE — Telephone Encounter (Signed)
Diflucan 200 mg x5 days

## 2011-07-20 NOTE — Telephone Encounter (Signed)
Pt informed with the below note,  

## 2011-07-21 DIAGNOSIS — Z0000 Encounter for general adult medical examination without abnormal findings: Secondary | ICD-10-CM | POA: Insufficient documentation

## 2011-07-21 NOTE — Assessment & Plan Note (Signed)
Chronic problem.  Well controlled w/ diet and exercise.  Last labs were excellent.  UTD on eye exam.  No changes.

## 2011-07-21 NOTE — Assessment & Plan Note (Signed)
Pt's PE WNL.  Discussed importance of mammo and GYN screenings but pt refuses.  UTD on colonoscopy and DEXA.  Reviewed labs from last visit.  Anticipatory guidance provided.

## 2011-07-21 NOTE — Assessment & Plan Note (Signed)
Had DEXA through [facility]- they recommended Reclast.  Will get BMP so pt can get appt scheduled for infusion.

## 2011-07-21 NOTE — Assessment & Plan Note (Signed)
Following w/ [facility].  Receiving intermittent steroid injxns- these will occasionally elevate CBGs.  Will follow along and assist as able.

## 2011-07-23 ENCOUNTER — Other Ambulatory Visit: Payer: Self-pay | Admitting: *Deleted

## 2011-07-23 NOTE — Telephone Encounter (Signed)
Verbally phoned in rx for xanax 2mg  TID/PRN. Pt is aware.

## 2011-07-23 NOTE — Telephone Encounter (Signed)
Pt called to advise medication for xanax was not at pharmacy. Noted sent on 07-16-11. Called [facility] on [address], pharmacy tech noted no fax received, did not correct fax number in our system. Verbally gave order for xanax 2mg  TID/PRN. Called pt to advise the rx is now being filled at [facility]

## 2011-07-24 ENCOUNTER — Other Ambulatory Visit: Payer: Self-pay | Admitting: Family Medicine

## 2011-07-24 MED ORDER — FENOFIBRATE 160 MG PO TABS: 160.0000 mg | ORAL_TABLET | Freq: Every day | ORAL | Status: DC

## 2011-07-24 NOTE — Telephone Encounter (Signed)
rx sent to pharmacy by e-script For fenofibrite

## 2011-07-30 ENCOUNTER — Encounter: Payer: Self-pay | Admitting: Family Medicine

## 2011-07-30 ENCOUNTER — Ambulatory Visit (INDEPENDENT_AMBULATORY_CARE_PROVIDER_SITE_OTHER): Payer: Medicare Other | Admitting: Family Medicine

## 2011-07-30 DIAGNOSIS — N760 Acute vaginitis: Secondary | ICD-10-CM

## 2011-07-30 DIAGNOSIS — N390 Urinary tract infection, site not specified: Secondary | ICD-10-CM

## 2011-07-30 MED ORDER — FLUCONAZOLE 150 MG PO TABS: 150.0000 mg | ORAL_TABLET | Freq: Once | ORAL | Status: DC

## 2011-07-30 NOTE — Progress Notes (Signed)
  Subjective:    Patient ID: Sarah Mcguire, female    DOB: 02-19-1941, 69 y.o.   MRN: [id number]  HPI ? UTI- developed sxs after steroid injxn.  Some increased frequency, no dysuria.  + vaginal itching, no d/c.  Has hx of both UTI and yeast infxns.  Took 1 diflucan 2 weeks ago 'but 1 is never enough'   Review of Systems For ROS see HPI     Objective:   Physical Exam  Vitals reviewed. Constitutional: She appears well-developed and well-nourished. No distress.  Abdominal: Soft. She exhibits no distension. There is no tenderness (no suprapubic or CVA tenderness).  Genitourinary:       Pt declines vaginal exam          Assessment & Plan:

## 2011-07-30 NOTE — Patient Instructions (Signed)
Take the Diflucan as directed We'll notify you of your urine culture Call with any questions or concerns Hang in there!!!

## 2011-07-31 LAB — POCT URINALYSIS DIPSTICK
Bilirubin, UA: NEGATIVE
Blood, UA: NEGATIVE
Glucose, UA: NEGATIVE

## 2011-07-31 NOTE — Assessment & Plan Note (Signed)
Pt w/ hx of difficult to tx yeast infxns.  UA is not suspicious for infxn.  Will await cx prior to starting abx.  Will start pt on diflucan.  Reviewed supportive care and red flags that should prompt return.  Pt expressed understanding and is in agreement w/ plan.

## 2011-08-02 LAB — URINE CULTURE: Colony Count: >=100000

## 2011-08-02 MED ORDER — NITROFURANTOIN MONOHYD MACRO 100 MG PO CAPS: 100.0000 mg | ORAL_CAPSULE | Freq: Two times a day (BID) | ORAL | Status: DC

## 2011-08-02 NOTE — Progress Notes (Signed)
Addended by: Derry Lory A on: 08/02/2011 05:08 PM   Modules accepted: Orders

## 2011-08-06 ENCOUNTER — Other Ambulatory Visit: Payer: Self-pay | Admitting: Family Medicine

## 2011-08-06 NOTE — Telephone Encounter (Signed)
rx sent to pharmacy by e-script  

## 2011-08-15 ENCOUNTER — Ambulatory Visit (INDEPENDENT_AMBULATORY_CARE_PROVIDER_SITE_OTHER): Payer: Medicare Other | Admitting: Family Medicine

## 2011-08-15 ENCOUNTER — Encounter: Payer: Self-pay | Admitting: Family Medicine

## 2011-08-15 VITALS — BP 125/85 | HR 90 | Temp 97.4°F | Ht 63.0 in | Wt 182.0 lb

## 2011-08-15 DIAGNOSIS — R350 Frequency of micturition: Secondary | ICD-10-CM

## 2011-08-15 DIAGNOSIS — IMO0001 Reserved for inherently not codable concepts without codable children: Secondary | ICD-10-CM

## 2011-08-15 DIAGNOSIS — N390 Urinary tract infection, site not specified: Secondary | ICD-10-CM

## 2011-08-15 LAB — POCT URINALYSIS DIPSTICK
Leukocytes, UA: NEGATIVE
Protein, UA: NEGATIVE
Spec Grav, UA: 1.02
Urobilinogen, UA: 0.2
pH, UA: 7.5

## 2011-08-15 MED ORDER — CIPROFLOXACIN HCL 500 MG PO TABS: 500.0000 mg | ORAL_TABLET | Freq: Two times a day (BID) | ORAL | Status: DC

## 2011-08-15 MED ORDER — HYDROCODONE-ACETAMINOPHEN 5-500 MG PO TABS: 1.0000 | ORAL_TABLET | Freq: Four times a day (QID) | ORAL | Status: DC | PRN

## 2011-08-15 MED ORDER — FLUCONAZOLE 150 MG PO TABS: 150.0000 mg | ORAL_TABLET | Freq: Once | ORAL | Status: DC

## 2011-08-15 NOTE — Patient Instructions (Signed)
We'll call you with your urine culture results Start the Cipro twice daily Take the Diflucan as needed for yeast Call with any questions or concerns Happy Holidays!!!

## 2011-08-15 NOTE — Progress Notes (Signed)
  Subjective:    Patient ID: [name], female    DOB: Jul 26, 1941, 70 y.o.   MRN: [id number]  HPI ? UTI- pt reports sxs 'just about went away but not completely'.  No dysuria.  + frequency, urgency, hesitancy.  No vaginal d/c.   Review of Systems For ROS see HPI     Objective:   Physical Exam  Vitals reviewed. Constitutional: She appears well-developed and well-nourished. No distress.  Abdominal: Soft. She exhibits no distension. There is no tenderness (no suprapubic or CVA tenderness).          Assessment & Plan:

## 2011-08-17 LAB — URINE CULTURE: Colony Count: NO GROWTH

## 2011-08-20 ENCOUNTER — Encounter: Payer: Self-pay | Admitting: Gynecology

## 2011-08-20 ENCOUNTER — Other Ambulatory Visit: Payer: Self-pay | Admitting: Family Medicine

## 2011-08-20 ENCOUNTER — Ambulatory Visit (INDEPENDENT_AMBULATORY_CARE_PROVIDER_SITE_OTHER): Payer: Medicare Other | Admitting: Gynecology

## 2011-08-20 ENCOUNTER — Telehealth: Payer: Self-pay | Admitting: *Deleted

## 2011-08-20 DIAGNOSIS — N898 Other specified noninflammatory disorders of vagina: Secondary | ICD-10-CM

## 2011-08-20 DIAGNOSIS — B373 Candidiasis of vulva and vagina: Secondary | ICD-10-CM

## 2011-08-20 DIAGNOSIS — L293 Anogenital pruritus, unspecified: Secondary | ICD-10-CM

## 2011-08-20 MED ORDER — BENAZEPRIL HCL 20 MG PO TABS: 20.0000 mg | ORAL_TABLET | Freq: Every day | ORAL | Status: DC

## 2011-08-20 MED ORDER — FLUCONAZOLE 200 MG PO TABS: 200.0000 mg | ORAL_TABLET | Freq: Every day | ORAL | Status: AC

## 2011-08-20 MED ORDER — AMLODIPINE BESYLATE 5 MG PO TABS: 5.0000 mg | ORAL_TABLET | Freq: Every day | ORAL | Status: DC

## 2011-08-20 MED ORDER — FOLIC ACID 1 MG PO TABS: 1.0000 mg | ORAL_TABLET | Freq: Every day | ORAL | Status: DC

## 2011-08-20 NOTE — Patient Instructions (Signed)
Take Diflucan one pill daily for 5 days, stop Zocor during treatment.

## 2011-08-20 NOTE — Telephone Encounter (Signed)
Pt called to advise that she has an upcoming appt with MD Fontaine per conitiues to itch Pt advised she is concerned about how serious this could be per noted online some major issues Advised pt the concern per MD Beverely Low, is that this is nothing major however she needs to have this checked into  As a precaution, pt advised that she did not take the yeast pills till after she was post ABT was this ok? Md advised this was ok. Pt understood.

## 2011-08-20 NOTE — Telephone Encounter (Signed)
rx sent to pharmacy by e-script  

## 2011-08-20 NOTE — Progress Notes (Signed)
Patient presents complaining of vaginal itching and discharge. She just finished course of antibiotics for UTI by her primary physician.  Exam with chaperone present External BUS vagina was atrophic genital changes white discharge noted. Bimanual without masses or tenderness.  Assessment and plan: Wet prep is positive for yeast. We'll treat with Diflucan 200 daily x5 days. Stop Zocor while taking the medication. Follow up if symptoms persist or recur. I did give her one refill as she does tend to have recurrences to have available as needed.

## 2011-08-21 NOTE — Assessment & Plan Note (Signed)
Pt w/ leuks on UA.  Start abx.  Diflucan b/c she typically develops yeast.  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  Pt expressed understanding and is in agreement w/ plan.

## 2011-08-31 ENCOUNTER — Telehealth: Payer: Self-pay | Admitting: Family Medicine

## 2011-08-31 MED ORDER — ZOSTER VACCINE LIVE 19400 UNT/0.65ML ~~LOC~~ SOLR: 0.6500 mL | Freq: Once | SUBCUTANEOUS | Status: AC

## 2011-08-31 NOTE — Telephone Encounter (Signed)
Printed rx for shingles shot to be given to pt in office currently in lobby to take to pharmacy 

## 2011-08-31 NOTE — Telephone Encounter (Signed)
Patient states that she would like Dr. Beverely Low to write her out a shingles prescription so she can take to a [facility] pharmacy.

## 2011-08-31 NOTE — Telephone Encounter (Signed)
Last OV 08/15/11.

## 2011-08-31 NOTE — Telephone Encounter (Signed)
Please send script for shingles vaccine electronically to [facility] of pt's choice.

## 2011-09-10 ENCOUNTER — Ambulatory Visit (INDEPENDENT_AMBULATORY_CARE_PROVIDER_SITE_OTHER): Payer: Medicare Other | Admitting: Gynecology

## 2011-09-10 ENCOUNTER — Encounter: Payer: Self-pay | Admitting: Gynecology

## 2011-09-10 DIAGNOSIS — B373 Candidiasis of vulva and vagina: Secondary | ICD-10-CM

## 2011-09-10 DIAGNOSIS — N898 Other specified noninflammatory disorders of vagina: Secondary | ICD-10-CM

## 2011-09-10 DIAGNOSIS — B3731 Acute candidiasis of vulva and vagina: Secondary | ICD-10-CM

## 2011-09-10 DIAGNOSIS — L293 Anogenital pruritus, unspecified: Secondary | ICD-10-CM

## 2011-09-10 LAB — WET PREP FOR TRICH, YEAST, CLUE
Clue Cells Wet Prep HPF POC: NONE SEEN
Trich, Wet Prep: NONE SEEN

## 2011-09-10 MED ORDER — FLUCONAZOLE 200 MG PO TABS: 200.0000 mg | ORAL_TABLET | Freq: Every day | ORAL | Status: AC

## 2011-09-10 MED ORDER — TERCONAZOLE 0.4 % VA CREA: 1.0000 | TOPICAL_CREAM | Freq: Every day | VAGINAL | Status: DC

## 2011-09-10 NOTE — Patient Instructions (Signed)
Used Terazol vaginal cream nightly for 7 nights. Take Diflucan 200 mg tablets 1 by mouth daily for 5 days with her next steroid injection.

## 2011-09-10 NOTE — Progress Notes (Signed)
Patient presents complaining of vaginal itching. She is a long history of recurrent yeast vulvovaginitis. Normally gets these following treatment for UTI with antibiotics sometimes following steroid back injections. She had taken most recently Diflucan 200 mg daily x9 days to eradicate a yeast infection early in December. She just noticed in in the last day or 2 some itching she took the other Diflucan she had left over.  She's been treated with Terazol 7 day and boric acid suppositories the past this is a 50 Diflucan worked best for her.  Exam with chaperone Sherrilyn Rist present External BUS vagina: Atrophic genital changes no overt discharge.  Bimanual: Without tenderness or masses.  Assessment and plan. Wet prep is negative. I think she, her yeast infection early. I still want to treat her with Terazol 7 day cream to eradicate any resistance subspecies. I also gave her prescription for Diflucan 200 #10 to take 1 by mouth daily x5 days with an additional course of treatment available. She is anticipating a stirrup back injection I told her to take the 5 days of Diflucan after the injection just to circumvent any yeast infection. The need to stop her cholesterol medication during the treatment was again discussed.  She does see her primary for routine gynecologic care and exams and is only seeing me for her recurrent yeast infections.

## 2011-09-12 ENCOUNTER — Other Ambulatory Visit: Payer: Self-pay | Admitting: Gynecology

## 2011-10-10 ENCOUNTER — Encounter: Payer: Self-pay | Admitting: Family Medicine

## 2011-10-10 ENCOUNTER — Ambulatory Visit (INDEPENDENT_AMBULATORY_CARE_PROVIDER_SITE_OTHER): Payer: Medicare Other | Admitting: Family Medicine

## 2011-10-10 DIAGNOSIS — I10 Essential (primary) hypertension: Secondary | ICD-10-CM

## 2011-10-10 DIAGNOSIS — E119 Type 2 diabetes mellitus without complications: Secondary | ICD-10-CM

## 2011-10-10 DIAGNOSIS — M810 Age-related osteoporosis without current pathological fracture: Secondary | ICD-10-CM

## 2011-10-10 DIAGNOSIS — E785 Hyperlipidemia, unspecified: Secondary | ICD-10-CM

## 2011-10-10 LAB — BASIC METABOLIC PANEL
Calcium: 9.8 mg/dL (ref 8.4–10.5)
Creatinine, Ser: 1 mg/dL (ref 0.4–1.2)
GFR: 56.06 mL/min — ABNORMAL LOW (ref >60.00)
Sodium: 140 mEq/L (ref 135–145)

## 2011-10-10 LAB — HEPATIC FUNCTION PANEL
ALT: 22 U/L (ref 0–35)
AST: 25 U/L (ref 0–37)
Albumin: 4.4 g/dL (ref 3.5–5.2)

## 2011-10-10 LAB — LIPID PANEL
Cholesterol: 142 mg/dL (ref 0–200)
Triglycerides: 135 mg/dL (ref 0.0–149.0)

## 2011-10-10 LAB — HEMOGLOBIN A1C: Hgb A1c MFr Bld: 6.8 % — ABNORMAL HIGH (ref 4.6–6.5)

## 2011-10-10 MED ORDER — HYDROCODONE-ACETAMINOPHEN 5-500 MG PO TABS: 1.0000 | ORAL_TABLET | Freq: Four times a day (QID) | ORAL | Status: DC | PRN

## 2011-10-10 NOTE — Patient Instructions (Signed)
Follow up in 3 months to recheck diabetes- you can eat before this We'll attempt this Reclast thing again [facility] notify you of your lab results Call with any questions or concerns You look great!  Keep up the good work!

## 2011-10-10 NOTE — Progress Notes (Signed)
  Subjective:    Patient ID: Sarah Mcguire, female    DOB: [birth year], 71 y.o.   MRN: [id number]  HPI DM- chronic problem, typically well controlled w/ diet and exercise.  Has lost 11 lbs.  CBGs running 80s-100s.  Denies symptomatic lows.  No CP, SOB, HAs, visual changes, edema.  HTN- chronic problem.  Well controlled on Norvasc, benazepril, lasix.  Asymptomatic.  Hyperlipidemia- chronic problem, typically well controlled on simvastatin.  Has been holding statin due to antifungal use.  Denies abd pain, N/V, myalgias.  Osteoporosis- this was determined by [facility] and they recommended Reclast.  Pt was never scheduled.  Would like [place] to proceed w/ this.  Review of Systems For ROS see HPI     Objective:   Physical Exam  Vitals reviewed. Constitutional: She is oriented to person, place, and time. She appears well-developed and well-nourished. No distress.  HENT:  Head: Normocephalic and atraumatic.  Eyes: Conjunctivae and EOM are normal. Pupils are equal, round, and reactive to light.  Neck: Normal range of motion. Neck supple. No thyromegaly present.  Cardiovascular: Normal rate, regular rhythm, normal heart sounds and intact distal pulses.   No murmur heard. Pulmonary/Chest: Effort normal and breath sounds normal. No respiratory distress.  Abdominal: Soft. She exhibits no distension. There is no tenderness.  Musculoskeletal: She exhibits no edema.  Lymphadenopathy:    She has no cervical adenopathy.  Neurological: She is alert and oriented to person, place, and time.  Skin: Skin is warm and dry.  Psychiatric: She has a normal mood and affect. Her behavior is normal.          Assessment & Plan:

## 2011-10-12 ENCOUNTER — Telehealth: Payer: Self-pay | Admitting: *Deleted

## 2011-10-12 NOTE — Telephone Encounter (Signed)
Pt wants to be set up for reclast noted her BMP was drawn on 10-10-11 and pt needs shot within 30days from blood draw, filled out reclast form and faxed to reclast with copied of insurance card, called reclast to confirm and address the pt has had the labs recently. reclast rep said they will process the form as it comes in.

## 2011-10-15 ENCOUNTER — Other Ambulatory Visit: Payer: Self-pay | Admitting: Family Medicine

## 2011-10-15 MED ORDER — SIMVASTATIN 20 MG PO TABS: 20.0000 mg | ORAL_TABLET | Freq: Every day | ORAL | Status: DC

## 2011-10-15 NOTE — Telephone Encounter (Signed)
rx sent to pharmacy by e-script  

## 2011-10-16 NOTE — Assessment & Plan Note (Signed)
Chronic problem.  Well controlled on current meds.  Asymptomatic.  No changes. 

## 2011-10-16 NOTE — Assessment & Plan Note (Signed)
Chronic problem.  Typically well controlled w/ diet and exercise.  Due for labs.  Applauded recent weight loss.  Continue to follow closely.

## 2011-10-16 NOTE — Assessment & Plan Note (Signed)
Chronic problem.  Tolerating statin w/out difficulty.  Due for labs.  Adjust meds prn. 

## 2011-10-16 NOTE — Assessment & Plan Note (Signed)
Will get BMP and proceed w/ Reclast scheduling.

## 2011-10-22 ENCOUNTER — Telehealth: Payer: Self-pay | Admitting: Family Medicine

## 2011-10-22 MED ORDER — GLUCOSE BLOOD VI STRP: ORAL_STRIP | Status: DC

## 2011-10-22 NOTE — Telephone Encounter (Signed)
Refill- one touch ultra stp tes life. Test one to two times each day. Qty 100 last fill 12.5.12

## 2011-10-22 NOTE — Telephone Encounter (Signed)
Refill- alprazolam 2mg  tab. Last fill 1.17.13

## 2011-10-22 NOTE — Telephone Encounter (Signed)
Last OV 10-10-11 Last refill 07-20-11 #90 with 2 refills rx sent to pharmacy by e-script for test strips

## 2011-10-22 NOTE — Telephone Encounter (Signed)
Ok for #90, 3 refills 

## 2011-10-23 MED ORDER — ALPRAZOLAM 2 MG PO TABS: 2.0000 mg | ORAL_TABLET | Freq: Three times a day (TID) | ORAL | Status: DC | PRN

## 2011-10-23 NOTE — Telephone Encounter (Signed)
.  rx faxed to pharmacy, manually. Alprazolam #90 with 3 refills to [facility]

## 2011-10-24 ENCOUNTER — Encounter: Payer: Self-pay | Admitting: Family Medicine

## 2011-10-24 ENCOUNTER — Other Ambulatory Visit: Payer: Self-pay | Admitting: *Deleted

## 2011-10-24 NOTE — Telephone Encounter (Signed)
Called pt to confirm that [facility] received her rx for xanax per pt left vm earlier stating they had not received the rx, also noted that reclast has been contacted today, per noted they had incorrect fax number, verified the number is correct number, awaiting information from them and then we will call her with the insurance details and go from there, pt understood

## 2011-10-25 ENCOUNTER — Telehealth: Payer: Self-pay | Admitting: *Deleted

## 2011-10-25 NOTE — Telephone Encounter (Signed)
Called pt to advise that her insurance does show a 20% co-pay and that I do not have the information as to what that will be, per she will need to consult her insurance about the difference, pt understood and does want me to further process the reclast, mailed pt a pamphlet about the procedure per pt asked if this will hurt, I advised that some pt have noted some discomfort after the procedure but it has been mild and the pt notes it is worth it. Faxed all information to [facility].

## 2011-10-26 ENCOUNTER — Telehealth: Payer: Self-pay | Admitting: *Deleted

## 2011-10-26 NOTE — Telephone Encounter (Signed)
Spoke to Sarah Mcguire about the insurance deductible per reclast, Sarah Mcguire understood and wanted to proceed with having the procedure set up, faxed recent BMP and all paperwork to [facility], Sarah Mcguire aware that someone will call her with an apt date and time, mailed reclast pamphlet to Sarah Mcguire address in chart per request.

## 2011-10-29 ENCOUNTER — Other Ambulatory Visit: Payer: Self-pay | Admitting: *Deleted

## 2011-10-29 ENCOUNTER — Other Ambulatory Visit: Payer: Self-pay | Admitting: Family Medicine

## 2011-10-29 DIAGNOSIS — M810 Age-related osteoporosis without current pathological fracture: Secondary | ICD-10-CM

## 2011-10-29 MED ORDER — ZOLEDRONIC ACID 5 MG/100ML IV SOLN: 5.0000 mg | Freq: Once | INTRAVENOUS | Status: DC

## 2011-11-02 ENCOUNTER — Other Ambulatory Visit (HOSPITAL_COMMUNITY): Payer: Self-pay | Admitting: *Deleted

## 2011-11-02 ENCOUNTER — Telehealth: Payer: Self-pay | Admitting: *Deleted

## 2011-11-02 NOTE — Telephone Encounter (Signed)
Called pt to instruct based on notes from MD Tabori, pt advised that she cancelled the reclast per she was so upset with all the side effects, advised that she may need to update with MD Renae Fickle to let him know as well, however this is totally her decision, pt advised she would call MD Renae Fickle to advise she is not going to have the reclast and to send the bone density test to MD Tabori.

## 2011-11-02 NOTE — Telephone Encounter (Signed)
Pt left vm stating she has a reclast apt upcoming at Tuesday, pt advised that the pamphlet noted she could be allergic to this per pt notes she is allergic to a lot of things already and she also noted the concern that this may cause you to have kidney failure and need dialysis, I advised this is noted for severe cases only, pt also wanted to ask MD Tabori if she really thinks this is necessary because all of the side effects is worrying her, I also advised that the decision is totally up to her but that I will inform MD Beverely Low about all her concerns and give her a call back

## 2011-11-02 NOTE — Telephone Encounter (Signed)
This medicine has been well tolerated by my patients.  If you read the insert for any medication it will talk about the risk of allergic reaction- by law it has to be included.  If she has taken fosamax/boniva etc this medication is similar in structure- it just avoids all the GI upset.  This was recommended by Dr Renae Fickle- I still have not seen the results of her bone density scan.  She will need to talk w/ him about whether it is necessary.

## 2011-11-06 ENCOUNTER — Inpatient Hospital Stay (HOSPITAL_COMMUNITY): Admission: RE | Admit: 2011-11-06 | Payer: Medicare Other | Source: Ambulatory Visit

## 2011-11-13 ENCOUNTER — Ambulatory Visit (INDEPENDENT_AMBULATORY_CARE_PROVIDER_SITE_OTHER): Payer: Medicare Other | Admitting: Family Medicine

## 2011-11-13 ENCOUNTER — Encounter: Payer: Self-pay | Admitting: Family Medicine

## 2011-11-13 VITALS — BP 130/82 | HR 98 | Temp 98.4°F | Ht 62.5 in | Wt 167.8 lb

## 2011-11-13 DIAGNOSIS — F410 Panic disorder [episodic paroxysmal anxiety] without agoraphobia: Secondary | ICD-10-CM | POA: Insufficient documentation

## 2011-11-13 NOTE — Progress Notes (Signed)
  Subjective:    Patient ID: Sarah Mcguire, female    DOB: 07-16-1941, 71 y.o.   MRN: [id number]  HPI Shaking- CBG 96 in office.  sxs started 1 week ago.  No med changes.  Pt reports she has cut 'way back' on intake in attempt to lose weight.  Is following weight watchers.  Is losing 1/2 lb-1 lb/work.  Husband reports she's 'scared of everything' recently.  Is frequently keeping her eyes closed.  Is having nightmares at night.  Took a xanax at 8 am- will occasionally provide relief.  A week ago, friend had 5 vessel bypass.  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  Is unable to speak- hx provided mostly by husband.  Pt refusing to open eyes and having full fledged panic attack in office.   Review of Systems For ROS see HPI     Objective:   Physical Exam  Vitals reviewed. Constitutional: She appears well-developed and well-nourished. She appears distressed (shaking, refusing to open eyes, grabbing my arm).  Cardiovascular: Normal rate, regular rhythm, normal heart sounds and intact distal pulses.   Pulmonary/Chest: Breath sounds normal. No respiratory distress. She has no wheezes. She has no rales.       Pt w/ clear lung exam once she stopped hyperventilating  Psychiatric:       Anxious, shaking, crying, refusing to open eyes, swaying back and forth, clutching onto my arm          Assessment & Plan:

## 2011-11-13 NOTE — Patient Instructions (Signed)
If you cannot calm down you will need to go to the [facility] ER Please call and schedule an appt with a psychiatrist ASAP- you cannot continue like this Take the xanax to calm down STOP the stuff at church that is stressing you out I AM NOT MAD AT YOU! Don't worry about the Reclast- that's not an issue Make sure you are eating a bedtime snack- peanut butter crackers, yogurt, etc Call with any questions or concerns Hang in there!

## 2011-11-13 NOTE — Assessment & Plan Note (Addendum)
New.  Pt's sxs are severe.  Having panic attack w/ hyperventilation in office.  Unable to get her to calm down.  Husband reports she has been having these at least daily for the last week.  No longer in counseling.  Only taking xanax- not on controller med.  Anxiety is out of control.  Names and #s of psychiatrists provided w/ instructions to establish ASAP.  Total time spent w/ pt- 28 minutes, >50% spent counseling.

## 2011-11-14 ENCOUNTER — Encounter: Payer: Medicare Other | Admitting: Family Medicine

## 2011-11-14 NOTE — Progress Notes (Signed)
This encounter was created in error - please disregard.

## 2011-12-02 ENCOUNTER — Emergency Department (HOSPITAL_COMMUNITY)
Admission: EM | Admit: 2011-12-02 | Discharge: 2011-12-02 | Disposition: A | Discharge status: Home / Self Care | Payer: Medicare Other | Attending: Emergency Medicine | Admitting: Emergency Medicine

## 2011-12-02 ENCOUNTER — Other Ambulatory Visit: Payer: Self-pay

## 2011-12-02 ENCOUNTER — Encounter (HOSPITAL_COMMUNITY): Payer: Self-pay | Admitting: Emergency Medicine

## 2011-12-02 DIAGNOSIS — E119 Type 2 diabetes mellitus without complications: Secondary | ICD-10-CM | POA: Insufficient documentation

## 2011-12-02 DIAGNOSIS — F419 Anxiety disorder, unspecified: Secondary | ICD-10-CM

## 2011-12-02 DIAGNOSIS — R51 Headache: Secondary | ICD-10-CM | POA: Insufficient documentation

## 2011-12-02 DIAGNOSIS — F411 Generalized anxiety disorder: Secondary | ICD-10-CM | POA: Insufficient documentation

## 2011-12-02 DIAGNOSIS — F17200 Nicotine dependence, unspecified, uncomplicated: Secondary | ICD-10-CM | POA: Insufficient documentation

## 2011-12-02 DIAGNOSIS — Z79899 Other long term (current) drug therapy: Secondary | ICD-10-CM | POA: Insufficient documentation

## 2011-12-02 DIAGNOSIS — R110 Nausea: Secondary | ICD-10-CM | POA: Insufficient documentation

## 2011-12-02 DIAGNOSIS — R197 Diarrhea, unspecified: Secondary | ICD-10-CM | POA: Insufficient documentation

## 2011-12-02 DIAGNOSIS — R5383 Other fatigue: Secondary | ICD-10-CM | POA: Insufficient documentation

## 2011-12-02 DIAGNOSIS — R5381 Other malaise: Secondary | ICD-10-CM | POA: Insufficient documentation

## 2011-12-02 DIAGNOSIS — R531 Weakness: Secondary | ICD-10-CM

## 2011-12-02 DIAGNOSIS — E785 Hyperlipidemia, unspecified: Secondary | ICD-10-CM | POA: Insufficient documentation

## 2011-12-02 LAB — CBC
MCV: 90.3 fL (ref 78.0–100.0)
Platelets: 330 10*3/uL (ref 150–400)
RBC: 5.14 MIL/uL — ABNORMAL HIGH (ref 3.87–5.11)
WBC: 9.8 10*3/uL (ref 4.0–10.5)

## 2011-12-02 LAB — BASIC METABOLIC PANEL
CO2: 26 mEq/L (ref 19–32)
Chloride: 104 mEq/L (ref 96–112)
Creatinine, Ser: 1.02 mg/dL (ref 0.50–1.10)
GFR calc Af Amer: 63 mL/min — ABNORMAL LOW (ref >90)
Potassium: 4.3 mEq/L (ref 3.5–5.1)

## 2011-12-02 LAB — URINALYSIS, ROUTINE W REFLEX MICROSCOPIC
Glucose, UA: NEGATIVE mg/dL
Ketones, ur: NEGATIVE mg/dL
Nitrite: NEGATIVE
Specific Gravity, Urine: 1.019 (ref 1.005–1.030)
pH: 6.5 (ref 5.0–8.0)

## 2011-12-02 LAB — URINE MICROSCOPIC-ADD ON

## 2011-12-02 LAB — TROPONIN I: Troponin I: <0.3 ng/mL (ref <0.30)

## 2011-12-02 LAB — GLUCOSE, CAPILLARY: Glucose-Capillary: 132 mg/dL — ABNORMAL HIGH (ref 70–99)

## 2011-12-02 NOTE — Discharge Instructions (Signed)
Anxiety and Panic Attacks Your caregiver has informed you that you are having an anxiety or panic attack. There may be many forms of this. Most of the time these attacks come suddenly and without warning. They come at any time of day, including periods of sleep, and at any time of life. They may be strong and unexplained. Although panic attacks are very scary, they are physically harmless. Sometimes the cause of your anxiety is not known. Anxiety is a protective mechanism of the body in its fight or flight mechanism. Most of these perceived danger situations are actually nonphysical situations (such as anxiety over losing a job). CAUSES  The causes of an anxiety or panic attack are many. Panic attacks may occur in otherwise healthy people given a certain set of circumstances. There may be a genetic cause for panic attacks. Some medications may also have anxiety as a side effect. SYMPTOMS  Some of the most common feelings are:  Intense terror.   Dizziness, feeling faint.   Hot and cold flashes.   Fear of going crazy.   Feelings that nothing is real.   Sweating.   Shaking.   Chest pain or a fast heartbeat (palpitations).   Smothering, choking sensations.   Feelings of impending doom and that death is near.   Tingling of extremities, this may be from over-breathing.   Altered reality (derealization).   Being detached from yourself (depersonalization).  Several symptoms can be present to make up anxiety or panic attacks. DIAGNOSIS  The evaluation by your caregiver will depend on the type of symptoms you are experiencing. The diagnosis of anxiety or panic attack is made when no physical illness can be determined to be a cause of the symptoms. TREATMENT  Treatment to prevent anxiety and panic attacks may include:  Avoidance of circumstances that cause anxiety.   Reassurance and relaxation.   Regular exercise.   Relaxation therapies, such as yoga.   Psychotherapy with a  psychiatrist or therapist.   Avoidance of caffeine, alcohol and illegal drugs.   Prescribed medication.  SEEK IMMEDIATE MEDICAL CARE IF:   You experience panic attack symptoms that are different than your usual symptoms.   You have any worsening or concerning symptoms.  Document Released: 08/20/2005 Document Revised: 08/09/2011 Document Reviewed: 12/22/2009 [facility] Patient Information 2012 [place], [place]. Weakness Weakness can be caused by many things. Your doctor has checked for the most common causes. HOME CARE  Rest.   Eat a well-balanced diet.   Exercise every day.   Go to follow-up appointments.  GET HELP RIGHT AWAY IF:   There is chest pain or chest pressure.   You have problems breathing.   You cannot do usual daily activities.   You have problems speaking or swallowing.   You have a very bad headache or belly (abdominal) pain.   The heartbeat does not feel normal or the pulse is very fast.   You are confused.   You have vision problems or problems walking.   You have chills.   You or your child has a temperature by mouth above 102 F (38.9 C), not controlled by medicine.   Your baby is older than 3 months with a rectal temperature of 102 F (38.9 C) or higher.   Your baby is 59 months old or younger with a rectal temperature of 100.4 F (38 C) or higher.  MAKE SURE YOU:   Understand these instructions.   Will watch this condition.   Will get help right away  if you are not doing well or get worse.  Document Released: 08/02/2008 Document Revised: 08/09/2011 Document Reviewed: 08/02/2008 [facility] Patient Information 2012 [place], [place].

## 2011-12-02 NOTE — ED Provider Notes (Signed)
History     CSN: [id number]  Arrival date & time 12/02/11  1041   First MD Initiated Contact with Patient 12/02/11 1113      Chief Complaint  Patient presents with  . Anxiety  . Hypoglycemia    (Consider location/radiation/quality/duration/timing/severity/associated sxs/prior treatment) HPI Comments: Patient states that this morning she woke up and checked her sugar and it was 85 and she felt weak all over.  Because she felt weak all over and thought it was related to her sugar she ate to be ceased cups and one banana.  She then got ready to go to church and her husband made her breakfast which consisted of eggs, sausage, bacon and cheese toast.  There went to church and patient noted some headache and states she felt quite anxious.  She felt weak as they were walking out like she might pass out.  She did not lose consciousness.  Patient describes feeling very nervous and anxious and that she thought it might be her sugar again.  She had no chest pain, shortness of breath, fevers, palpitations, nausea or abdominal pain.  She had no focal weakness or numbness.  No slurred speech.  Patient has been recently started on Cymbalta 9 days ago by her psychiatrist for anxiety.  Per her and her husband note that she's had some increased symptoms of anxiety and headaches as well as nausea and diarrhea and increased feeling of nervousness over the last 9 days.  Patient feels completely well now.  [name] presenting with anxiety. The history is provided by the patient and the spouse. No language interpreter was used.  Anxiety This is a chronic problem. Associated symptoms include headaches. Pertinent negatives include no chest pain, no abdominal pain and no shortness of breath.    Past Medical History  Diagnosis Date  . Hyperlipidemia   . Hypertension   . Hyperglycemia   . GERD (gastroesophageal reflux disease)   . Diabetes mellitus   . Anemia   . Leukocytosis   .  Diverticulitis     Past Surgical History  Procedure Date  . Tonsillectomy   . Tympanostomy tube placement     R ear    Family History  Problem Relation Age of Onset  . Diabetes Mother   . Hypertension Mother   . Heart disease Father     MI age 9's   . Diabetes Brother     History  Substance Use Topics  . Smoking status: Current Everyday Smoker -- 0.8 packs/day    Types: Cigarettes  . Smokeless tobacco: Never Used  . Alcohol Use: No    OB History    Grav Para Term Preterm Abortions TAB SAB Ect Mult Living   2 2 2       2       Review of Systems  Constitutional: Negative.  Negative for fever and chills.  Eyes: Negative.  Negative for discharge and redness.  Respiratory: Negative.  Negative for cough and shortness of breath.   Cardiovascular: Negative.  Negative for chest pain.  Gastrointestinal: Positive for nausea and diarrhea. Negative for vomiting and abdominal pain.  Genitourinary: Negative.  Negative for dysuria and vaginal discharge.  Musculoskeletal: Negative.  Negative for back pain.  Skin: Negative.  Negative for color change and rash.  Neurological: Positive for headaches. Negative for syncope.  Hematological: Negative.  Negative for adenopathy.  Psychiatric/Behavioral: Negative for confusion. The patient is nervous/anxious.   All other systems reviewed and are negative.  Allergies  Amoxicillin; Augmentin; Citalopram hydrobromide; Penicillins; Pyridium; Zonegran; Zonisamide; and Zoloft  Home Medications   Current Outpatient Rx  Name Route Sig Dispense Refill  . ALPRAZOLAM 2 MG PO TABS Oral Take 1 tablet (2 mg total) by mouth 3 (three) times daily as needed for sleep. 90 tablet 3  . AMLODIPINE BESYLATE 5 MG PO TABS Oral Take 1 tablet (5 mg total) by mouth daily. 30 tablet 5  . BENAZEPRIL HCL 20 MG PO TABS Oral Take 1 tablet (20 mg total) by mouth daily. 30 tablet 5  . ONETOUCH ULTRA SYSTEM W/DEVICE KIT Does not apply 1 kit by Does not apply route  once.      Marland Kitchen CALCIUM MAGNESIUM 750 PO Oral Take by mouth daily.      Marland Kitchen CEFUROXIME AXETIL 500 MG PO TABS Oral Take 500 mg by mouth 2 (two) times daily.      Marland Kitchen ESOMEPRAZOLE MAGNESIUM 40 MG PO CPDR Oral Take 1 capsule (40 mg total) by mouth daily as needed. 30 capsule 6  . FENOFIBRATE 160 MG PO TABS Oral Take 1 tablet (160 mg total) by mouth daily. 90 tablet 1  . FOLIC ACID 1 MG PO TABS Oral Take 1 tablet (1 mg total) by mouth daily. 30 tablet 3  . FUROSEMIDE 20 MG PO TABS Oral Take 20 mg by mouth daily. May 2nd tablet as needed for swelling     . GLUCOSE BLOOD VI STRP  1-2 times daily as directed Use as instructed 100 each 3  . HYDROCODONE-ACETAMINOPHEN 5-500 MG PO TABS Oral Take 1 tablet by mouth every 6 (six) hours as needed. 45 tablet 0  . LIDODERM 5 % EX PTCH      . MELOXICAM 7.5 MG PO TABS Oral Take 7.5 mg by mouth daily.      Marland Kitchen ONE-DAILY MULTI VITAMINS PO TABS Oral Take 1 tablet by mouth daily.      Marland Kitchen NAPROXEN 500 MG PO TABS Oral Take 1 tablet (500 mg total) by mouth 2 (two) times daily with a meal. 60 tablet 2  . NITROFURANTOIN MONOHYD MACRO 100 MG PO CAPS  TAKE 1 CAPSULE (100 MG TOTAL) BY MOUTH 2 (TWO) TIMES DAILY. 30 capsule 1  . ONETOUCH LANCETS MISC Does not apply by Does not apply route daily. 1-2 times daily     . SIMVASTATIN 20 MG PO TABS Oral Take 1 tablet (20 mg total) by mouth at bedtime. 90 tablet 1  . TERCONAZOLE 0.4 % VA CREA  INSERT ONE APPLICATORFUL VAGINALLY AT BEDTIME FOR 7 NIGHTS 45 g 1  . TRIMETHOPRIM 100 MG PO TABS Oral Take 100 mg by mouth daily.      BP 135/81  Pulse 100  Temp(Src) 97.6 F (36.4 C) (Oral)  Resp 20  SpO2 98%  Physical Exam  Nursing note and vitals reviewed. Constitutional: She is oriented to person, place, and time. She appears well-developed and well-nourished.  Non-toxic appearance. She does not have a sickly appearance.  HENT:  Head: Normocephalic and atraumatic.  Eyes: Conjunctivae, EOM and lids are normal. Pupils are equal, round, and  reactive to light. No scleral icterus.  Neck: Trachea normal and normal range of motion. Neck supple.  Cardiovascular: Normal rate, regular rhythm and normal heart sounds.  Exam reveals no gallop and no friction rub.   No murmur heard. Pulmonary/Chest: Effort normal and breath sounds normal. No respiratory distress. She has no wheezes. She has no rales.  Abdominal: Soft. Normal appearance. There is no  tenderness. There is no rebound, no guarding and no CVA tenderness.  Musculoskeletal: Normal range of motion.  Neurological: She is alert and oriented to person, place, and time. She has normal strength.  Skin: Skin is warm, dry and intact. No rash noted.  Psychiatric: Her behavior is normal. Judgment and thought content normal.       Patient is mildly anxious.    ED Course  Procedures (including critical care time)  Results for orders placed during the hospital encounter of 12/02/11  GLUCOSE, CAPILLARY      Component Value Range   Glucose-Capillary 132 (*) 70 - 99 (mg/dL)  CBC      Component Value Range   WBC 9.8  4.0 - 10.5 (K/uL)   RBC 5.14 (*) 3.87 - 5.11 (MIL/uL)   Hemoglobin 15.2 (*) 12.0 - 15.0 (g/dL)   HCT 09.6 (*) 04.5 - 46.0 (%)   MCV 90.3  78.0 - 100.0 (fL)   MCH 29.6  26.0 - 34.0 (pg)   MCHC 32.8  30.0 - 36.0 (g/dL)   RDW 40.9  81.1 - 91.4 (%)   Platelets 330  150 - 400 (K/uL)  BASIC METABOLIC PANEL      Component Value Range   Sodium 140  135 - 145 (mEq/L)   Potassium 4.3  3.5 - 5.1 (mEq/L)   Chloride 104  96 - 112 (mEq/L)   CO2 26  19 - 32 (mEq/L)   Glucose, Bld 85  70 - 99 (mg/dL)   BUN 23  6 - 23 (mg/dL)   Creatinine, Ser 7.82  0.50 - 1.10 (mg/dL)   Calcium 95.6  8.4 - 10.5 (mg/dL)   GFR calc non Af Amer 54 (*) >90 (mL/min)   GFR calc Af Amer 63 (*) >90 (mL/min)  URINALYSIS, ROUTINE W REFLEX MICROSCOPIC      Component Value Range   Color, Urine YELLOW  YELLOW    APPearance CLOUDY (*) CLEAR    Specific Gravity, Urine 1.019  1.005 - 1.030    pH 6.5  5.0 -  8.0    Glucose, UA NEGATIVE  NEGATIVE (mg/dL)   Hgb urine dipstick NEGATIVE  NEGATIVE    Bilirubin Urine NEGATIVE  NEGATIVE    Ketones, ur NEGATIVE  NEGATIVE (mg/dL)   Protein, ur NEGATIVE  NEGATIVE (mg/dL)   Urobilinogen, UA 0.2  0.0 - 1.0 (mg/dL)   Nitrite NEGATIVE  NEGATIVE    Leukocytes, UA TRACE (*) NEGATIVE   TROPONIN I      Component Value Range   Troponin I <0.30  <0.30 (ng/mL)  URINE MICROSCOPIC-ADD ON      Component Value Range   Squamous Epithelial / LPF RARE  RARE    WBC, UA 3-6  <3 (WBC/hpf)   Bacteria, UA MANY (*) RARE       Date: 12/02/2011  Rate: 91  Rhythm: normal sinus rhythm  QRS Axis: normal  Intervals: normal  ST/T Wave abnormalities: normal  Conduction Disutrbances:none  Narrative Interpretation:   Old EKG Reviewed: unchanged from 12-26-09    MDM  Patient with significant anxiety.  Patient did not appear to have a true presyncopal episode.  No other gross report seem to be significantly low to be causing symptoms given the patient is not a diabetic.  Patient has no symptoms at this time.  She has a normal EKG and normal laboratory studies at this time.  I've encouraged the patient and her husband a followup with her primary care physician Dr. Beverely Low regarding her  Cymbalta which may or may not be related to some of her new symptoms over the last week.  They feel comfortable going home at this time and will followup with her this week.        Nat Christen, MD 12/02/11 1336

## 2011-12-02 NOTE — ED Notes (Signed)
Patient is pre-Diabetic controlled with diet. Was sent to psychiatrist for anxiety and placed on Cymbalta. Has been having Nausea, diarrhea, weakness and headache since beginning the cymbalta. Pt comes in ED jittery, anxious. This morning glucose of 80, gave her banana and #2 [employer], then good breakfast. tried to go to church too weak to stay.

## 2011-12-02 NOTE — ED Notes (Signed)
Patient is alert and oriented x3.  She was given DC instructions and follow up visit instructions.  Patient gave verbal understanding. She was DC ambulatory under his own power to home.  V/S stable.  He was not showing any signs of distress on DC 

## 2011-12-02 NOTE — ED Notes (Signed)
Patient is alert and oriented x3.  She has recently started taking cymbalta for anxiety.  She was informed by her physician the side effects of cymbalta.  The symptoms that she is having started along with the beginning of the medication approximately 10 days ago.

## 2011-12-13 ENCOUNTER — Telehealth: Payer: Self-pay | Admitting: Family Medicine

## 2011-12-13 DIAGNOSIS — E119 Type 2 diabetes mellitus without complications: Secondary | ICD-10-CM

## 2011-12-13 NOTE — Telephone Encounter (Signed)
Patients husband Onalee Hua) has called & requested an referral to  [facility] ph# [phone] Please call  Onalee Hua back at [phone]

## 2011-12-14 NOTE — Telephone Encounter (Signed)
CBGs in the 100s are perfect.  This is exactly where they should be!  We will be happy to provide referral if this is what they want

## 2011-12-14 NOTE — Telephone Encounter (Signed)
Called to speak to pt about referral for endocrinologist, pt husband advised she is currently sleeping and advised that per MD Tabori instructions during prior OV that pt should take a bedtime snack to assist with low CBG in the am, pt husband notes that they eat a big supper at and then a bedtime snack and CBG at bedtime ranging around the 100's, pt still notes waking up about 6am really weak, pt husband states that they do not check the CBG before giving her a snack in order to get help her feel better, pt husband speaking for pt to seek an endo apt with [facility] ph# [phone], please advise

## 2011-12-14 NOTE — Telephone Encounter (Signed)
Spoke to pt to advise results/instructions. Pt understood. Still wants referral to [facility] ph# [phone] Placed referral in system and made coordinator aware of preferrance, pt aware someone will call about apt, pt understood

## 2011-12-18 ENCOUNTER — Telehealth: Payer: Self-pay | Admitting: Family Medicine

## 2011-12-18 MED ORDER — FOLIC ACID 1 MG PO TABS: 1.0000 mg | ORAL_TABLET | Freq: Every day | ORAL | Status: DC

## 2011-12-18 NOTE — Telephone Encounter (Signed)
rx sent to pharmacy by e-script  

## 2011-12-18 NOTE — Telephone Encounter (Signed)
Refill: Folic Acid 1mg . Take 1 tablet by mouth daily. Qty 30. Last fill 11-19-11

## 2011-12-26 ENCOUNTER — Encounter: Payer: Self-pay | Admitting: Family Medicine

## 2012-01-09 ENCOUNTER — Encounter: Payer: Self-pay | Admitting: Family Medicine

## 2012-01-09 ENCOUNTER — Encounter: Payer: Medicare Other | Admitting: Family Medicine

## 2012-01-09 ENCOUNTER — Ambulatory Visit (INDEPENDENT_AMBULATORY_CARE_PROVIDER_SITE_OTHER): Payer: Medicare Other | Admitting: Family Medicine

## 2012-01-09 VITALS — BP 138/82 | HR 100 | Temp 98.4°F | Ht 62.25 in | Wt 172.6 lb

## 2012-01-09 DIAGNOSIS — N390 Urinary tract infection, site not specified: Secondary | ICD-10-CM

## 2012-01-09 DIAGNOSIS — I10 Essential (primary) hypertension: Secondary | ICD-10-CM

## 2012-01-09 DIAGNOSIS — E119 Type 2 diabetes mellitus without complications: Secondary | ICD-10-CM

## 2012-01-09 DIAGNOSIS — E785 Hyperlipidemia, unspecified: Secondary | ICD-10-CM

## 2012-01-09 DIAGNOSIS — R300 Dysuria: Secondary | ICD-10-CM

## 2012-01-09 DIAGNOSIS — R52 Pain, unspecified: Secondary | ICD-10-CM

## 2012-01-09 LAB — CBC WITH DIFFERENTIAL/PLATELET
Basophils Absolute: 0 10*3/uL (ref 0.0–0.1)
Basophils Relative: 0.3 % (ref 0.0–3.0)
Eosinophils Absolute: 0.1 10*3/uL (ref 0.0–0.7)
Eosinophils Relative: 0.7 % (ref 0.0–5.0)
HCT: 43.3 % (ref 36.0–46.0)
Hemoglobin: 14.7 g/dL (ref 12.0–15.0)
Lymphocytes Relative: 14 % (ref 12.0–46.0)
Lymphs Abs: 1.9 10*3/uL (ref 0.7–4.0)
MCHC: 33.9 g/dL (ref 30.0–36.0)
MCV: 88.9 fl (ref 78.0–100.0)
Monocytes Absolute: 1 10*3/uL (ref 0.1–1.0)
Monocytes Relative: 6.9 % (ref 3.0–12.0)
Neutro Abs: 10.8 10*3/uL — ABNORMAL HIGH (ref 1.4–7.7)
Neutrophils Relative %: 78.1 % — ABNORMAL HIGH (ref 43.0–77.0)
Platelets: 251 10*3/uL (ref 150.0–400.0)
RBC: 4.87 Mil/uL (ref 3.87–5.11)
RDW: 15 % — ABNORMAL HIGH (ref 11.5–14.6)
WBC: 13.9 10*3/uL — ABNORMAL HIGH (ref 4.5–10.5)

## 2012-01-09 LAB — POCT URINALYSIS DIPSTICK
Blood, UA: NEGATIVE
Nitrite, UA: NEGATIVE
Protein, UA: NEGATIVE
Urobilinogen, UA: 0.2
pH, UA: 5

## 2012-01-09 LAB — HEPATIC FUNCTION PANEL
ALT: 14 U/L (ref 0–35)
Albumin: 4.1 g/dL (ref 3.5–5.2)
Bilirubin, Direct: 0 mg/dL (ref 0.0–0.3)
Total Protein: 7.5 g/dL (ref 6.0–8.3)

## 2012-01-09 LAB — LIPID PANEL
HDL: 55.2 mg/dL (ref >39.00)
Total CHOL/HDL Ratio: 2
Triglycerides: 87 mg/dL (ref 0.0–149.0)

## 2012-01-09 MED ORDER — CIPROFLOXACIN HCL 500 MG PO TABS: 500.0000 mg | ORAL_TABLET | Freq: Two times a day (BID) | ORAL | Status: DC

## 2012-01-09 MED ORDER — FLUCONAZOLE 150 MG PO TABS: 150.0000 mg | ORAL_TABLET | Freq: Once | ORAL | Status: DC

## 2012-01-09 NOTE — Patient Instructions (Signed)
Follow up in 3 months to recheck Diabetes You look great!  Keep up the good work! We'll notify you of your lab results Start the Cipro twice daily Use the Diflucan as needed Happy Spring!

## 2012-01-09 NOTE — Progress Notes (Signed)
  Subjective:    Patient ID: Sarah Mcguire, female    DOB: [birth year], 71 y.o.   MRN: [id number]  HPI DM- chronic problem, not on meds.  Has seen Endo.  Well controlled w/ diet.  CBGs 80-130.  Hyperlipidemia- chronic problem, on Simvastatin, fenofibrate.  Denies N/V, myalgias  HTN- chronic problem, adequate control on Norvasc, Benazepril, Lasix.  Denies CP, SOB, HAs, visual changes, edema  Lower abd pain- pain over the bladder, dysuria, started this AM.  + frequency.  Hx of UTI.  This feels similar    Review of Systems For ROS see HPI     Objective:   Physical Exam  Vitals reviewed. Constitutional: She is oriented to person, place, and time. She appears well-developed and well-nourished. No distress.  HENT:  Head: Normocephalic and atraumatic.  Eyes: Conjunctivae and EOM are normal. Pupils are equal, round, and reactive to light.  Neck: Normal range of motion. Neck supple. No thyromegaly present.  Cardiovascular: Normal rate, regular rhythm, normal heart sounds and intact distal pulses.   No murmur heard. Pulmonary/Chest: Effort normal and breath sounds normal. No respiratory distress.  Abdominal: Soft. She exhibits no distension. There is tenderness (over bladder).  Musculoskeletal: She exhibits no edema.  Lymphadenopathy:    She has no cervical adenopathy.  Neurological: She is alert and oriented to person, place, and time.  Skin: Skin is warm and dry.  Psychiatric: She has a normal mood and affect. Her behavior is normal.          Assessment & Plan:

## 2012-01-11 ENCOUNTER — Telehealth: Payer: Self-pay | Admitting: Family Medicine

## 2012-01-11 MED ORDER — METRONIDAZOLE 500 MG PO TABS: 500.0000 mg | ORAL_TABLET | Freq: Three times a day (TID) | ORAL | Status: AC

## 2012-01-11 MED ORDER — CIPROFLOXACIN HCL 500 MG PO TABS: 500.0000 mg | ORAL_TABLET | Freq: Two times a day (BID) | ORAL | Status: DC

## 2012-01-11 NOTE — Telephone Encounter (Signed)
Gave pt husband instructions/results and new rx information, pt husband understood and advised to take pt to hospital if her sxs worsen. Pt husband understands

## 2012-01-11 NOTE — Telephone Encounter (Signed)
Onalee Hua (Spouse), states his wife is still having stomach pain, they think she may have diverticulitis & would like an antibiotic & Cipro called in. Please call Onalee Hua back at [phone]

## 2012-01-15 ENCOUNTER — Other Ambulatory Visit: Payer: Self-pay | Admitting: Family Medicine

## 2012-01-15 MED ORDER — FENOFIBRATE 160 MG PO TABS: 160.0000 mg | ORAL_TABLET | Freq: Every day | ORAL | Status: DC

## 2012-01-15 NOTE — Telephone Encounter (Signed)
rx sent to pharmacy by e-script  

## 2012-01-15 NOTE — Telephone Encounter (Signed)
Refill Fenofibrate 160MG  TAB MYLA Qty 90 Take one tablet by mouth daily  Last filled 2.18.13 Last OV 5.8.13

## 2012-01-24 ENCOUNTER — Ambulatory Visit (INDEPENDENT_AMBULATORY_CARE_PROVIDER_SITE_OTHER): Payer: Medicare Other | Admitting: Family Medicine

## 2012-01-24 ENCOUNTER — Encounter: Payer: Self-pay | Admitting: Family Medicine

## 2012-01-24 VITALS — BP 122/78 | HR 95 | Temp 98.2°F | Ht 62.5 in | Wt 168.0 lb

## 2012-01-24 DIAGNOSIS — N390 Urinary tract infection, site not specified: Secondary | ICD-10-CM

## 2012-01-24 DIAGNOSIS — R300 Dysuria: Secondary | ICD-10-CM

## 2012-01-24 LAB — POCT URINALYSIS DIPSTICK
Bilirubin, UA: NEGATIVE
Glucose, UA: NEGATIVE
Ketones, UA: NEGATIVE
Leukocytes, UA: NEGATIVE
Nitrite, UA: NEGATIVE

## 2012-01-24 MED ORDER — CIPROFLOXACIN HCL 500 MG PO TABS: 500.0000 mg | ORAL_TABLET | Freq: Two times a day (BID) | ORAL | Status: AC

## 2012-01-24 MED ORDER — FLUCONAZOLE 150 MG PO TABS: 150.0000 mg | ORAL_TABLET | Freq: Once | ORAL | Status: DC

## 2012-01-24 NOTE — Patient Instructions (Signed)
This doesn't seem to be diverticulitis b/c your belly is not tender Restart the Cipro to complete at 10 day total course Have a great trip!!!

## 2012-01-24 NOTE — Progress Notes (Signed)
  Subjective:    Patient ID: Sarah Mcguire, female    DOB: 12/25/1940, 71 y.o.   MRN: [id number]  HPI Dysuria- reports 'nagging ache' in bladder.  Drinking lots of water.  No nausea or vomiting.  Pain is intermittent.  Completed 5 days of Cipro and 10 days of flagyl.  Leaving for vacation and is worried about this.  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   Review of Systems For ROS see HPI     Objective:   Physical Exam  Vitals reviewed. Constitutional: She appears well-developed and well-nourished. No distress.  Abdominal: Soft. She exhibits no distension. There is no tenderness (no suprapubic or CVA tenderness). There is no rebound and no guarding.          Assessment & Plan:

## 2012-01-29 NOTE — Assessment & Plan Note (Signed)
Recurrent.  UA w/out abnormality today.  Due to pt's hx will complete 10 day course of cipro.  No need for flagyl at this time.  Reviewed supportive care and red flags that should prompt return.  Pt expressed understanding and is in agreement w/ plan.

## 2012-02-01 ENCOUNTER — Ambulatory Visit (INDEPENDENT_AMBULATORY_CARE_PROVIDER_SITE_OTHER): Payer: Medicare Other | Admitting: Family Medicine

## 2012-02-01 ENCOUNTER — Encounter: Payer: Self-pay | Admitting: Family Medicine

## 2012-02-01 VITALS — BP 112/72 | HR 95 | Temp 97.7°F | Ht 63.0 in | Wt 168.0 lb

## 2012-02-01 DIAGNOSIS — Z79899 Other long term (current) drug therapy: Secondary | ICD-10-CM

## 2012-02-01 DIAGNOSIS — N390 Urinary tract infection, site not specified: Secondary | ICD-10-CM

## 2012-02-01 DIAGNOSIS — R5383 Other fatigue: Secondary | ICD-10-CM | POA: Insufficient documentation

## 2012-02-01 DIAGNOSIS — I10 Essential (primary) hypertension: Secondary | ICD-10-CM

## 2012-02-01 LAB — VITAMIN B12: Vitamin B-12: 478 pg/mL (ref 211–911)

## 2012-02-01 LAB — CBC WITH DIFFERENTIAL/PLATELET
Basophils Absolute: 0.1 10*3/uL (ref 0.0–0.1)
Basophils Relative: 0.6 % (ref 0.0–3.0)
Eosinophils Relative: 1.3 % (ref 0.0–5.0)
HCT: 43.2 % (ref 36.0–46.0)
Hemoglobin: 14.1 g/dL (ref 12.0–15.0)
Lymphocytes Relative: 32 % (ref 12.0–46.0)
Monocytes Relative: 9.7 % (ref 3.0–12.0)
Neutro Abs: 4.9 10*3/uL (ref 1.4–7.7)
RBC: 4.77 Mil/uL (ref 3.87–5.11)
WBC: 8.7 10*3/uL (ref 4.5–10.5)

## 2012-02-01 LAB — BASIC METABOLIC PANEL
BUN: 43 mg/dL — ABNORMAL HIGH (ref 6–23)
CO2: 29 mEq/L (ref 19–32)
Chloride: 102 mEq/L (ref 96–112)
Creatinine, Ser: 1.3 mg/dL — ABNORMAL HIGH (ref 0.4–1.2)
Glucose, Bld: 94 mg/dL (ref 70–99)

## 2012-02-01 LAB — POCT URINALYSIS DIPSTICK
Ketones, UA: NEGATIVE
Protein, UA: NEGATIVE
Spec Grav, UA: 1.01

## 2012-02-01 LAB — HEPATIC FUNCTION PANEL
ALT: 20 U/L (ref 0–35)
Albumin: 4.1 g/dL (ref 3.5–5.2)
Bilirubin, Direct: 0.1 mg/dL (ref 0.0–0.3)
Total Protein: 6.9 g/dL (ref 6.0–8.3)

## 2012-02-01 LAB — FOLATE: Folate: >24.8 ng/mL (ref >5.9)

## 2012-02-01 MED ORDER — NITROFURANTOIN MONOHYD MACRO 100 MG PO CAPS: 100.0000 mg | ORAL_CAPSULE | Freq: Two times a day (BID) | ORAL | Status: AC

## 2012-02-01 MED ORDER — FLUCONAZOLE 150 MG PO TABS: 150.0000 mg | ORAL_TABLET | Freq: Once | ORAL | Status: DC

## 2012-02-01 NOTE — Progress Notes (Signed)
  Subjective:    Patient ID: Sarah Mcguire, female    DOB: 1941-02-03, 71 y.o.   MRN: [id number]  HPI Fatigue- 'my energy- it's gone'.  sxs started 4-6 weeks ago.  Sleeping well at night, still feeling tired during the day.  'i'm just worn out'.  No CP, SOB, abd pain, N/V/D.  sxs not getting worse but not getting better.  Husband reports BP at home is sometimes less than 100 systolic.  Urinary frequency- completed 5 day course of cipro, denies dysuria.  No fevers.  No chills.   Review of Systems For ROS see HPI     Objective:   Physical Exam  Vitals reviewed. Constitutional: She is oriented to person, place, and time. She appears well-developed and well-nourished. No distress.  HENT:  Head: Normocephalic and atraumatic.  Eyes: Conjunctivae and EOM are normal. Pupils are equal, round, and reactive to light.  Neck: Normal range of motion. Neck supple. No thyromegaly present.  Cardiovascular: Normal rate, regular rhythm, normal heart sounds and intact distal pulses.   No murmur heard. Pulmonary/Chest: Effort normal and breath sounds normal. No respiratory distress.  Abdominal: Soft. She exhibits no distension. There is no tenderness.  Musculoskeletal: She exhibits no edema.  Lymphadenopathy:    She has no cervical adenopathy.  Neurological: She is alert and oriented to person, place, and time.  Skin: Skin is warm and dry.  Psychiatric: She has a normal mood and affect. Her behavior is normal.          Assessment & Plan:

## 2012-02-01 NOTE — Patient Instructions (Signed)
We'll notify you of your lab results and make any changes if needed Decrease the Amlodipine (Norvasc) to 1/2 tab Start the Macrobid for the bladder Take the Diflucan as needed Call with any questions or concerns Hang in there!!!

## 2012-02-03 LAB — CULTURE, URINE COMPREHENSIVE: Colony Count: NO GROWTH

## 2012-02-03 NOTE — Assessment & Plan Note (Signed)
Possibly too well controlled based on husband's report of home readings and pt's new fatigue.  Decrease amlodipine to 1/2 tab daily and follow closely.

## 2012-02-03 NOTE — Assessment & Plan Note (Signed)
Pt's UA suspicious for infxn.  Start macrobid and wait for urine cx.

## 2012-02-03 NOTE — Assessment & Plan Note (Signed)
New.  Suspect this is multifactorial- stress, recent infxn, hypotension.  Check labs to r/o metabolic abnormality, infxn, vitamin deficiency.  Decrease BP meds.  Will follow.

## 2012-02-05 ENCOUNTER — Telehealth: Payer: Self-pay | Admitting: Family Medicine

## 2012-02-05 NOTE — Telephone Encounter (Signed)
Patients husband Onalee Hua called and needs to clarify result information given to patient yesterday. He states patient was extremely upset & concerned. He is not listed on DPR Patient ph# [phone]

## 2012-02-05 NOTE — Telephone Encounter (Signed)
Called pt and gave instructions per MD Beverely Low, pt requested numbers for her kidney, advised her Creatnine is 1.3 which is one tenth higher than the normal level range 0.4-1.2 pt understood and will start drinking more water to the goal of 64oz daily, pt advised too early to take another test per needs to drink more water to clarify this is the reason for her current sxs, pt understood.

## 2012-02-10 NOTE — Assessment & Plan Note (Signed)
Chronic problem.  Well controlled.  Asymptomatic.  No changes. 

## 2012-02-10 NOTE — Assessment & Plan Note (Signed)
Given pt's sxs and hx of sepsis will start abx.  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  Pt expressed understanding and is in agreement w/ plan.

## 2012-02-10 NOTE — Assessment & Plan Note (Signed)
Chronic problem.  Tolerating statin w/out difficulty.  Check labs.  Adjust meds prn  

## 2012-02-10 NOTE — Assessment & Plan Note (Signed)
Chronic problem.  Controlling w/ diet and exercise.  CBGs well controlled.  Check labs.  Adjust meds prn

## 2012-02-13 ENCOUNTER — Encounter: Payer: Self-pay | Admitting: Family Medicine

## 2012-02-13 ENCOUNTER — Encounter (HOSPITAL_BASED_OUTPATIENT_CLINIC_OR_DEPARTMENT_OTHER): Payer: Self-pay | Admitting: Emergency Medicine

## 2012-02-13 ENCOUNTER — Emergency Department (HOSPITAL_BASED_OUTPATIENT_CLINIC_OR_DEPARTMENT_OTHER)
Admission: EM | Admit: 2012-02-13 | Discharge: 2012-02-13 | Disposition: A | Discharge status: Home / Self Care | Payer: Medicare Other | Attending: Emergency Medicine | Admitting: Emergency Medicine

## 2012-02-13 ENCOUNTER — Ambulatory Visit (INDEPENDENT_AMBULATORY_CARE_PROVIDER_SITE_OTHER): Payer: Medicare Other | Admitting: Family Medicine

## 2012-02-13 ENCOUNTER — Emergency Department (HOSPITAL_BASED_OUTPATIENT_CLINIC_OR_DEPARTMENT_OTHER): Payer: Medicare Other

## 2012-02-13 VITALS — BP 137/84 | HR 96 | Temp 98.3°F | Ht 62.0 in | Wt 172.6 lb

## 2012-02-13 DIAGNOSIS — F410 Panic disorder [episodic paroxysmal anxiety] without agoraphobia: Secondary | ICD-10-CM

## 2012-02-13 DIAGNOSIS — E119 Type 2 diabetes mellitus without complications: Secondary | ICD-10-CM | POA: Insufficient documentation

## 2012-02-13 DIAGNOSIS — R079 Chest pain, unspecified: Secondary | ICD-10-CM

## 2012-02-13 DIAGNOSIS — Z7982 Long term (current) use of aspirin: Secondary | ICD-10-CM | POA: Insufficient documentation

## 2012-02-13 DIAGNOSIS — K5730 Diverticulosis of large intestine without perforation or abscess without bleeding: Secondary | ICD-10-CM | POA: Insufficient documentation

## 2012-02-13 DIAGNOSIS — I509 Heart failure, unspecified: Secondary | ICD-10-CM | POA: Insufficient documentation

## 2012-02-13 DIAGNOSIS — F17200 Nicotine dependence, unspecified, uncomplicated: Secondary | ICD-10-CM | POA: Insufficient documentation

## 2012-02-13 DIAGNOSIS — Z79899 Other long term (current) drug therapy: Secondary | ICD-10-CM | POA: Insufficient documentation

## 2012-02-13 DIAGNOSIS — E785 Hyperlipidemia, unspecified: Secondary | ICD-10-CM | POA: Insufficient documentation

## 2012-02-13 DIAGNOSIS — I10 Essential (primary) hypertension: Secondary | ICD-10-CM | POA: Insufficient documentation

## 2012-02-13 DIAGNOSIS — K219 Gastro-esophageal reflux disease without esophagitis: Secondary | ICD-10-CM | POA: Insufficient documentation

## 2012-02-13 HISTORY — DX: Polyneuropathy, unspecified: G62.9

## 2012-02-13 LAB — PROTIME-INR
INR: 0.91 (ref 0.00–1.49)
Prothrombin Time: 12.5 seconds (ref 11.6–15.2)

## 2012-02-13 LAB — PRO B NATRIURETIC PEPTIDE: Pro B Natriuretic peptide (BNP): 187.9 pg/mL — ABNORMAL HIGH (ref 0–125)

## 2012-02-13 LAB — URINALYSIS, ROUTINE W REFLEX MICROSCOPIC
Bilirubin Urine: NEGATIVE
Glucose, UA: NEGATIVE mg/dL
Hgb urine dipstick: NEGATIVE
Ketones, ur: NEGATIVE mg/dL
Protein, ur: NEGATIVE mg/dL
Urobilinogen, UA: 0.2 mg/dL (ref 0.0–1.0)

## 2012-02-13 LAB — CBC
HCT: 41.9 % (ref 36.0–46.0)
Hemoglobin: 14.4 g/dL (ref 12.0–15.0)
MCV: 87.8 fL (ref 78.0–100.0)
WBC: 10 10*3/uL (ref 4.0–10.5)

## 2012-02-13 LAB — COMPREHENSIVE METABOLIC PANEL
Alkaline Phosphatase: 50 U/L (ref 39–117)
BUN: 29 mg/dL — ABNORMAL HIGH (ref 6–23)
Chloride: 105 mEq/L (ref 96–112)
Creatinine, Ser: 0.9 mg/dL (ref 0.50–1.10)
GFR calc Af Amer: 73 mL/min — ABNORMAL LOW (ref >90)
GFR calc non Af Amer: 63 mL/min — ABNORMAL LOW (ref >90)
Glucose, Bld: 89 mg/dL (ref 70–99)
Potassium: 4.5 mEq/L (ref 3.5–5.1)
Total Bilirubin: 0.2 mg/dL — ABNORMAL LOW (ref 0.3–1.2)

## 2012-02-13 MED ORDER — SODIUM CHLORIDE 0.9 % IV SOLN
1000.0000 mL | INTRAVENOUS | Status: DC
  Administered 2012-02-13: 1000 mL via INTRAVENOUS

## 2012-02-13 MED ORDER — ASPIRIN 81 MG PO CHEW: 324.0000 mg | CHEWABLE_TABLET | Freq: Once | ORAL | Status: DC

## 2012-02-13 MED ORDER — NITROGLYCERIN 0.4 MG SL SUBL
0.4000 mg | SUBLINGUAL_TABLET | SUBLINGUAL | Status: DC | PRN
  Filled 2012-02-13: qty 25

## 2012-02-13 MED ORDER — CLONAZEPAM 1 MG PO TABS: ORAL_TABLET | ORAL | Status: DC

## 2012-02-13 NOTE — Assessment & Plan Note (Signed)
Deteriorated.  Pt continues to have severe, untreated anxiety.  Now fears I'm trying to commit her.  Provided reassurance on this issue.  Will start scheduled Klonopin BID and save xanax for prn use.  Discussed counseling and again seeing psych.  Pt not interested in either.  Will continue to follow closely.

## 2012-02-13 NOTE — Discharge Instructions (Signed)
Mrs. Waggoner, you had physical examination, laboratory tests, EKG, and chest x-ray to check on U. for chest pain. Fortunately, your tests were all good. No serious cause of chest pain was found. It is safe to go home. He should plan to followup with your family physician, Quenten Raven.D.

## 2012-02-13 NOTE — Progress Notes (Signed)
  Subjective:    Patient ID: Sarah Mcguire, female    DOB: December 23, 1940, 71 y.o.   MRN: [id number]  HPI CP- went to ER this AM for L sided chest pain.  Had normal EKG, cardiac enzymes, labs.  Reports lately she has been waking at 5 am sweating, anxious and CBGs are low.  Complains of SOB- improved CXR this AM when compared to previous.  EDP feels pts sxs are more anxiety related than cardio-pulmonary.  Currently denies CP, SOB.  + fatigue.  No N/V/D.  Continues to have high anxiety- was unable to tolerate Cymbalta from psych.  Not interested in returning for f/u.  Pt concerned that at visit earlier this year when she was having full blown panic attack and I mentioned [facility] that I want to commit her to a psych facility.   Review of Systems For ROS see HPI     Objective:   Physical Exam  Constitutional: She is oriented to person, place, and time. She appears well-developed and well-nourished. No distress.  HENT:  Head: Normocephalic and atraumatic.  Eyes: Conjunctivae and EOM are normal. Pupils are equal, round, and reactive to light.  Neck: Normal range of motion. Neck supple. No thyromegaly present.  Cardiovascular: Normal rate, regular rhythm, normal heart sounds and intact distal pulses.   No murmur heard. Pulmonary/Chest: Effort normal and breath sounds normal. No respiratory distress.  Abdominal: Soft. She exhibits no distension. There is no tenderness.  Musculoskeletal: She exhibits no edema.  Lymphadenopathy:    She has no cervical adenopathy.  Neurological: She is alert and oriented to person, place, and time.  Skin: Skin is warm and dry.  Psychiatric: She has a normal mood and affect. Her behavior is normal.          Assessment & Plan:

## 2012-02-13 NOTE — ED Notes (Signed)
Patient reports she doesn't think she can use the bathroom right now; call light within reach and patient states she will notify staff when she is able to provide urine sample.

## 2012-02-13 NOTE — Patient Instructions (Addendum)
Start the klonopin twice daily- every day! Use the Xanax as needed for anxiety- this is your rescue medicine Your chest xray, EKG, and blood work all looked good today- this is good news! Call with any questions or concerns Have a great summer!!!

## 2012-02-13 NOTE — ED Notes (Signed)
Chest pain in left chest for several weeks, worse after taking multiple antibiotics over the last month for diverticulitis.  States the pain has been worse over the last week, and became increasingly worse since 0400 am today.  Symptoms are associated with sob.  Patient has history of extreme anxiety.

## 2012-02-13 NOTE — ED Provider Notes (Signed)
History     CSN: [id number]  Arrival date & time 02/13/12  0856   None     No chief complaint on file.   (Consider location/radiation/quality/duration/timing/severity/associated sxs/prior treatment) HPI Comments: The patient is a 71 year old woman who's had intermittent chest pain for the past week, worsens 4 AM. She had been treated for diverticulitis in May and then for a urinary infection with Macrobid on May 31. 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  [name] presenting with chest pain. The history is provided by the patient, the spouse and medical records. No language interpreter was used.  Chest Pain The chest pain began 5 - 7 days ago. Episode frequency: She has episodes of chest pain, like a brick on her chest. These last 30-60 minutes. The chest pain is worsening. The pain is associated with stress. At its most intense, the pain is at 5/10. The pain is currently at 5/10. The quality of the pain is described as pressure-like. The pain does not radiate. Primary symptoms comment: Recent treatment for urinary infection. She tried nothing for the symptoms. Risk factors include being elderly, smoking/tobacco exposure and stress.  Her past medical history is significant for CHF and diabetes.     Past Medical History  Diagnosis Date  . Hyperlipidemia   . Hypertension   . Hyperglycemia   . GERD (gastroesophageal reflux disease)   . Diabetes mellitus   . Anemia   . Leukocytosis   . Diverticulitis     Past Surgical History  Procedure Date  . Tonsillectomy   . Tympanostomy tube placement     R ear    Family History  Problem Relation Age of Onset  . Diabetes Mother   . Hypertension Mother   . Heart disease Father     MI age 76's   . Diabetes Brother     History  Substance Use Topics  .  Smoking status: Current Everyday Smoker -- 0.8 packs/day    Types: Cigarettes  . Smokeless tobacco: Never Used  . Alcohol Use: No    OB History    Grav Para Term Preterm Abortions TAB SAB Ect Mult Living   2 2 2       2       Review of Systems  Constitutional: Negative.   HENT: Negative.   Eyes: Negative.   Respiratory: Negative.   Cardiovascular: Positive for chest pain.  Gastrointestinal: Negative.   Genitourinary: Negative.   Musculoskeletal: Negative.   Skin: Negative.   Neurological: Negative.   Psychiatric/Behavioral: The patient is nervous/anxious.     Allergies  Amoxicillin; Amoxicillin-pot clavulanate; Citalopram hydrobromide; Cymbalta; Penicillins; Pyridium; Zonegran; Zonisamide; and Sertraline hcl  Home Medications   Current Outpatient Rx  Name Route Sig Dispense Refill  . ALPRAZOLAM 2 MG PO TABS Oral Take 1-2 mg by mouth 3 (three) times daily as needed. For anxiety/sleep.    Marland Kitchen AMLODIPINE BESYLATE 5 MG PO TABS Oral Take 1 tablet (5 mg total) by mouth daily. 30 tablet 5  . ASPIRIN 325 MG PO TABS Oral Take 650 mg by mouth every 6 (six) hours as needed. For pain.    Marland Kitchen BENAZEPRIL HCL 20 MG PO TABS Oral Take 1 tablet (20 mg total) by mouth daily. 30 tablet 5  . ONETOUCH ULTRA SYSTEM W/DEVICE KIT Does not apply 1 kit by  Does not apply route once.      Marland Kitchen CALCIUM + D PO Oral Take 1 tablet by mouth daily.    Marland Kitchen VITAMIN D 2000 UNITS PO TABS Oral Take 2,000 Units by mouth daily.    Marland Kitchen ESOMEPRAZOLE MAGNESIUM 40 MG PO CPDR Oral Take 1 capsule (40 mg total) by mouth daily as needed. 30 capsule 6  . FENOFIBRATE 160 MG PO TABS Oral Take 1 tablet (160 mg total) by mouth daily. 90 tablet 1  . FLUCONAZOLE 150 MG PO TABS Oral Take 1 tablet (150 mg total) by mouth once. May repeat in 3 days if needed 2 tablet 1  . FOLIC ACID 1 MG PO TABS Oral Take 1 tablet (1 mg total) by mouth daily. 30 tablet 3  . FUROSEMIDE 20 MG PO TABS Oral Take 20 mg by mouth daily as needed. For swelling    .  GLUCOSE BLOOD VI STRP  1-2 times daily as directed Use as instructed 100 each 3  . HYDROCODONE-ACETAMINOPHEN 5-500 MG PO TABS Oral Take 1 tablet by mouth every 6 (six) hours as needed. 45 tablet 0  . LIDODERM 5 % EX PTCH Transdermal Place 1 patch onto the skin daily as needed. Apply 1 patch for 12 hours and remove for 12 hours; for pain.    . MELOXICAM 7.5 MG PO TABS Oral Take 7.5 mg by mouth daily as needed. For muscle pain.    Marland Kitchen METRONIDAZOLE 500 MG PO TABS      . ONE-DAILY MULTI VITAMINS PO TABS Oral Take 1 tablet by mouth daily.      Marland Kitchen NAPROXEN 500 MG PO TABS Oral Take 1 tablet (500 mg total) by mouth 2 (two) times daily with a meal. 60 tablet 2  . ONETOUCH LANCETS MISC Does not apply by Does not apply route daily. 1-2 times daily     . SIMVASTATIN 20 MG PO TABS Oral Take 1 tablet (20 mg total) by mouth at bedtime. 90 tablet 1  . TERCONAZOLE 0.4 % VA CREA      . TRAMADOL HCL 50 MG PO TABS Oral Take 50 mg by mouth every 6 (six) hours as needed. For pain.    Marland Kitchen TRIMETHOPRIM 100 MG PO TABS Oral Take 100 mg by mouth daily.      There were no vitals taken for this visit.  Physical Exam  Constitutional: She is oriented to person, place, and time. She appears well-developed and well-nourished. Distressed: appears anxious, moderate distress. Not currently complaining of chest pain.  HENT:  Head: Normocephalic and atraumatic.  Right Ear: External ear normal.  Left Ear: External ear normal.  Mouth/Throat: Oropharynx is clear and moist.  Eyes: Conjunctivae and EOM are normal. Pupils are equal, round, and reactive to light. No scleral icterus.  Neck: Normal range of motion. Neck supple.  Cardiovascular: Normal rate, regular rhythm and normal heart sounds.   Pulmonary/Chest: Effort normal and breath sounds normal.  Abdominal: Soft. Bowel sounds are normal.  Musculoskeletal: Normal range of motion. She exhibits no edema and no tenderness.  Neurological: She is alert and oriented to person, place,  and time.       Sensory or motor deficit.  Skin: Skin is warm and dry.  Psychiatric:       Seems quite anxious.    ED Course  Procedures (including critical care time)   9:06 AM  Date: 02/13/2012  Rate: 85  Rhythm: normal sinus rhythm  QRS Axis: normal  Intervals: normal  ST/T  Wave abnormalities: normal  Conduction Disutrbances:none  Narrative Interpretation: Normal EKG  Old EKG Reviewed: none available  9:37 AM Patient was seen and had physical exam. Old charts were reviewed, including her hospitalization in 2011 for hepatic abscess. At that time she had some congestive failure related to fluid overload. EKG was benign. Laboratory testing and chest x-ray were ordered.  11:22 AM Results for orders placed during the hospital encounter of 02/13/12  CBC      Component Value Range   WBC 10.0  4.0 - 10.5 K/uL   RBC 4.77  3.87 - 5.11 MIL/uL   Hemoglobin 14.4  12.0 - 15.0 g/dL   HCT 04.5  40.9 - 81.1 %   MCV 87.8  78.0 - 100.0 fL   MCH 30.2  26.0 - 34.0 pg   MCHC 34.4  30.0 - 36.0 g/dL   RDW 91.4  78.2 - 95.6 %   Platelets 256  150 - 400 K/uL  COMPREHENSIVE METABOLIC PANEL      Component Value Range   Sodium 141  135 - 145 mEq/L   Potassium 4.5  3.5 - 5.1 mEq/L   Chloride 105  96 - 112 mEq/L   CO2 28  19 - 32 mEq/L   Glucose, Bld 89  70 - 99 mg/dL   BUN 29 (*) 6 - 23 mg/dL   Creatinine, Ser 2.13  0.50 - 1.10 mg/dL   Calcium 08.6  8.4 - 57.8 mg/dL   Total Protein 7.2  6.0 - 8.3 g/dL   Albumin 4.0  3.5 - 5.2 g/dL   AST 24  0 - 37 U/L   ALT 18  0 - 35 U/L   Alkaline Phosphatase 50  39 - 117 U/L   Total Bilirubin 0.2 (*) 0.3 - 1.2 mg/dL   GFR calc non Af Amer 63 (*) >90 mL/min   GFR calc Af Amer 73 (*) >90 mL/min  PROTIME-INR      Component Value Range   Prothrombin Time 12.5  11.6 - 15.2 seconds   INR 0.91  0.00 - 1.49  APTT      Component Value Range   aPTT 29  24 - 37 seconds  URINALYSIS, ROUTINE W REFLEX MICROSCOPIC      Component Value Range   Color, Urine  YELLOW  YELLOW   APPearance CLEAR  CLEAR   Specific Gravity, Urine 1.010  1.005 - 1.030   pH 7.0  5.0 - 8.0   Glucose, UA NEGATIVE  NEGATIVE mg/dL   Hgb urine dipstick NEGATIVE  NEGATIVE   Bilirubin Urine NEGATIVE  NEGATIVE   Ketones, ur NEGATIVE  NEGATIVE mg/dL   Protein, ur NEGATIVE  NEGATIVE mg/dL   Urobilinogen, UA 0.2  0.0 - 1.0 mg/dL   Nitrite NEGATIVE  NEGATIVE   Leukocytes, UA NEGATIVE  NEGATIVE  PRO B NATRIURETIC PEPTIDE      Component Value Range   Pro B Natriuretic peptide (BNP) 187.9 (*) 0 - 125 pg/mL  TROPONIN I      Component Value Range   Troponin I <0.30  <0.30 ng/mL   Dg Chest Portable 1 View  02/13/2012  *RADIOLOGY REPORT*  Clinical Data: Chest pain. Shortness of breath.  High blood pressure.  Diabetes.  PORTABLE CHEST - 1 VIEW  Comparison: 01/01/2012.  Findings: Since prior examination, interval improved aeration of the left lung base.  Minimal atelectatic changes lung bases.  No congestive heart failure or pneumothorax.  Calcified aorta.  The patient would eventually benefit from  follow-up two-view chest with cardiac leads removed.  IMPRESSION: Interval improved aeration left base.  Please see above.  Original Report Authenticated By: Fuller Canada, M.D.    Lab workup was reassuringly negative.  I advised that Sarah Mcguire had chest pain of unknown cause but no serious cause was found.  She should continue her regular medicines, including Xanax.\  11:28 AM Case discussed with Dr. Beverely Low and results of tests reviewed with her.  1. Chest pain            Carleene Cooper III, MD 02/13/12 1128

## 2012-02-13 NOTE — Assessment & Plan Note (Signed)
New.  Had normal ER w/u.  sxs more consistent w/ anxiety than cardiac abnormality.  Will follow.

## 2012-02-14 ENCOUNTER — Telehealth: Payer: Self-pay | Admitting: Family Medicine

## 2012-02-14 MED ORDER — ALPRAZOLAM 2 MG PO TABS: 1.0000 mg | ORAL_TABLET | Freq: Three times a day (TID) | ORAL | Status: DC | PRN

## 2012-02-14 MED ORDER — AMLODIPINE BESYLATE 5 MG PO TABS: 5.0000 mg | ORAL_TABLET | Freq: Every day | ORAL | Status: DC

## 2012-02-14 NOTE — Telephone Encounter (Signed)
MD Beverely Low reviewed last refill and noted to send pt #90 with 3 refills per last refill of 90 with 2 refills noted on 10-23-11, faxed RX signed from MD Beverely Low, faxed to pharmacy manually

## 2012-02-14 NOTE — Telephone Encounter (Signed)
Refill: Amlodipine 5 mg tab. Take 1 tablet by mouth daily. Qty 30. Last fill 01-15-12

## 2012-02-14 NOTE — Telephone Encounter (Signed)
rx sent to pharmacy by e-script  

## 2012-02-14 NOTE — Telephone Encounter (Signed)
Refill: Alprazolam 2mg  tab. Last fill 01-15-12

## 2012-02-18 ENCOUNTER — Ambulatory Visit (INDEPENDENT_AMBULATORY_CARE_PROVIDER_SITE_OTHER): Payer: Medicare Other | Admitting: Women's Health

## 2012-02-18 ENCOUNTER — Other Ambulatory Visit (INDEPENDENT_AMBULATORY_CARE_PROVIDER_SITE_OTHER): Payer: Medicare Other

## 2012-02-18 ENCOUNTER — Encounter: Payer: Self-pay | Admitting: Women's Health

## 2012-02-18 ENCOUNTER — Other Ambulatory Visit: Payer: Medicare Other

## 2012-02-18 DIAGNOSIS — N390 Urinary tract infection, site not specified: Secondary | ICD-10-CM

## 2012-02-18 DIAGNOSIS — L293 Anogenital pruritus, unspecified: Secondary | ICD-10-CM

## 2012-02-18 DIAGNOSIS — N898 Other specified noninflammatory disorders of vagina: Secondary | ICD-10-CM

## 2012-02-18 LAB — POCT URINALYSIS DIPSTICK
Bilirubin, UA: NEGATIVE
Glucose, UA: NEGATIVE
Spec Grav, UA: <=1.005
Urobilinogen, UA: 0.2

## 2012-02-18 LAB — WET PREP FOR TRICH, YEAST, CLUE
Clue Cells Wet Prep HPF POC: NONE SEEN
Yeast Wet Prep HPF POC: NONE SEEN

## 2012-02-18 MED ORDER — FLUCONAZOLE 200 MG PO TABS: 200.0000 mg | ORAL_TABLET | Freq: Every day | ORAL | Status: DC

## 2012-02-18 NOTE — Patient Instructions (Signed)
Diverticulitis A diverticulum is a small pouch or sac on the colon. Diverticulosis is the presence of these diverticula on the colon. Diverticulitis is the irritation (inflammation) or infection of diverticula. CAUSES  The colon and its diverticula contain bacteria. If food particles block the tiny opening to a diverticulum, the bacteria inside can grow and cause an increase in pressure. This leads to infection and inflammation and is called diverticulitis. SYMPTOMS   Abdominal pain and tenderness. Usually, the pain is located on the left side of your abdomen. However, it could be located elsewhere.   Fever.   Bloating.   Feeling sick to your stomach (nausea).   Throwing up (vomiting).   Abnormal stools.  DIAGNOSIS  Your caregiver will take a history and perform a physical exam. Since many things can cause abdominal pain, other tests may be necessary. Tests may include:  Blood tests.   Urine tests.   X-ray of the abdomen.   CT scan of the abdomen.  Sometimes, surgery is needed to determine if diverticulitis or other conditions are causing your symptoms. TREATMENT  Most of the time, you can be treated without surgery. Treatment includes:  Resting the bowels by only having liquids for a few days. As you improve, you will need to eat a low-fiber diet.   Intravenous (IV) fluids if you are losing body fluids (dehydrated).   Antibiotic medicines that treat infections may be given.   Pain and nausea medicine, if needed.   Surgery if the inflamed diverticulum has burst.  HOME CARE INSTRUCTIONS   Try a clear liquid diet (broth, tea, or water for as long as directed by your caregiver). You may then gradually begin a low-fiber diet as tolerated. A low-fiber diet is a diet with less than 10 grams of fiber. Choose the foods below to reduce fiber in the diet:   White breads, cereals, rice, and pasta.   Cooked fruits and vegetables or soft fresh fruits and vegetables without the skin.     Ground or well-cooked tender beef, ham, veal, lamb, pork, or poultry.   Eggs and seafood.   After your diverticulitis symptoms have improved, your caregiver may put you on a high-fiber diet. A high-fiber diet includes 14 grams of fiber for every 1000 calories consumed. For a standard 2000 calorie diet, you would need 28 grams of fiber. Follow these diet guidelines to help you increase the fiber in your diet. It is important to slowly increase the amount fiber in your diet to avoid gas, constipation, and bloating.   Choose whole-grain breads, cereals, pasta, and brown rice.   Choose fresh fruits and vegetables with the skin on. Do not overcook vegetables because the more vegetables are cooked, the more fiber is lost.   Choose more nuts, seeds, legumes, dried peas, beans, and lentils.   Look for food products that have greater than 3 grams of fiber per serving on the Nutrition Facts label.   Take all medicine as directed by your caregiver.   If your caregiver has given you a follow-up appointment, it is very important that you go. Not going could result in lasting (chronic) or permanent injury, pain, and disability. If there is any problem keeping the appointment, call to reschedule.  SEEK MEDICAL CARE IF:   Your pain does not improve.   You have a hard time advancing your diet beyond clear liquids.   Your bowel movements do not return to normal.  SEEK IMMEDIATE MEDICAL CARE IF:   Your pain becomes   worse.   You have an oral temperature above 102 F (38.9 C), not controlled by medicine.   You have repeated vomiting.   You have bloody or black, tarry stools.   Symptoms that brought you to your caregiver become worse or are not getting better.  MAKE SURE YOU:   Understand these instructions.   Will watch your condition.   Will get help right away if you are not doing well or get worse.  Document Released: 05/30/2005 Document Revised: 08/09/2011 Document Reviewed:  09/25/2010 [facility] Patient Information 2012 [place], [place].Monilial Vaginitis Vaginitis in a soreness, swelling and redness (inflammation) of the vagina and vulva. Monilial vaginitis is not a sexually transmitted infection. CAUSES  Yeast vaginitis is caused by yeast (candida) that is normally found in your vagina. With a yeast infection, the candida has overgrown in number to a point that upsets the chemical balance. SYMPTOMS   White, thick vaginal discharge.   Swelling, itching, redness and irritation of the vagina and possibly the lips of the vagina (vulva).   Burning or painful urination.   Painful intercourse.  DIAGNOSIS  Things that may contribute to monilial vaginitis are:  Postmenopausal and virginal states.   Pregnancy.   Infections.   Being tired, sick or stressed, especially if you had monilial vaginitis in the past.   Diabetes. Good control will help lower the chance.   Birth control pills.   Tight fitting garments.   Using bubble bath, feminine sprays, douches or deodorant tampons.   Taking certain medications that kill germs (antibiotics).   Sporadic recurrence can occur if you become ill.  TREATMENT  Your caregiver will give you medication.  There are several kinds of anti monilial vaginal creams and suppositories specific for monilial vaginitis. For recurrent yeast infections, use a suppository or cream in the vagina 2 times a week, or as directed.   Anti-monilial or steroid cream for the itching or irritation of the vulva may also be used. Get your caregiver's permission.   Painting the vagina with methylene blue solution may help if the monilial cream does not work.   Eating yogurt may help prevent monilial vaginitis.  HOME CARE INSTRUCTIONS   Finish all medication as prescribed.   Do not have sex until treatment is completed or after your caregiver tells you it is okay.   Take warm sitz baths.   Do not douche.   Do not use tampons,  especially scented ones.   Wear cotton underwear.   Avoid tight pants and panty hose.   Tell your sexual partner that you have a yeast infection. They should go to their caregiver if they have symptoms such as mild rash or itching.   Your sexual partner should be treated as well if your infection is difficult to eliminate.   Practice safer sex. Use condoms.   Some vaginal medications cause latex condoms to fail. Vaginal medications that harm condoms are:   Cleocin cream.   Butoconazole (Femstat).   Terconazole (Terazol) vaginal suppository.   Miconazole (Monistat) (may be purchased over the counter).  SEEK MEDICAL CARE IF:   You have a temperature by mouth above 102 F (38.9 C).   The infection is getting worse after 2 days of treatment.   The infection is not getting better after 3 days of treatment.   You develop blisters in or around your vagina.   You develop vaginal bleeding, and it is not your menstrual period.   You have pain when you urinate.  You develop intestinal problems.   You have pain with sexual intercourse.  Document Released: 05/30/2005 Document Revised: 08/09/2011 Document Reviewed: 02/11/2009 [facility] Patient Information 2012 [place], [place].

## 2012-02-18 NOTE — Progress Notes (Signed)
Patient ID: Sarah Mcguire, female   DOB: 1940/12/08, 71 y.o.   MRN: [id number] Presents for intense vaginal itching for the past couple of weeks. Tried Monistat with no relief. Recent Cipro/flagyl for 2 weeks, couple weeks ago for diverticulitis and Macrobid last week for UTI. Epidural injection last Thursday for sciatica. Denies dysuria, discharge or vaginal odor. Hx type II diabetes-diet control, last Hbg AIC per patient 6.7.   Exam: External genitalia erythematous. Wet prep done with q tip  Negative for yeast, positive for bacteria, otherwise normal. No uterine or adnexal tenderness.  Probable yeast  Plan: Diflucan 200 mg X 10 pills with one refill, prescription given per request, has used in past. Instructed to take one pill today, repeat in 2-3 days, and repeat again in 2-3 days, then as needed. Reviewed liver toxic, need to use minimum.  Yeast prevention reviewed. Instructed to call if no relief.

## 2012-02-19 ENCOUNTER — Ambulatory Visit: Payer: Medicare Other | Admitting: Family Medicine

## 2012-02-19 LAB — URINE CULTURE
Colony Count: NO GROWTH
Organism ID, Bacteria: NO GROWTH

## 2012-02-20 ENCOUNTER — Encounter: Payer: Self-pay | Admitting: *Deleted

## 2012-02-26 ENCOUNTER — Ambulatory Visit (INDEPENDENT_AMBULATORY_CARE_PROVIDER_SITE_OTHER): Payer: Medicare Other | Admitting: Gynecology

## 2012-02-26 ENCOUNTER — Encounter: Payer: Self-pay | Admitting: Gynecology

## 2012-02-26 DIAGNOSIS — L293 Anogenital pruritus, unspecified: Secondary | ICD-10-CM

## 2012-02-26 DIAGNOSIS — N760 Acute vaginitis: Secondary | ICD-10-CM

## 2012-02-26 DIAGNOSIS — B9689 Other specified bacterial agents as the cause of diseases classified elsewhere: Secondary | ICD-10-CM

## 2012-02-26 DIAGNOSIS — N952 Postmenopausal atrophic vaginitis: Secondary | ICD-10-CM

## 2012-02-26 DIAGNOSIS — N898 Other specified noninflammatory disorders of vagina: Secondary | ICD-10-CM

## 2012-02-26 DIAGNOSIS — A499 Bacterial infection, unspecified: Secondary | ICD-10-CM

## 2012-02-26 LAB — WET PREP FOR TRICH, YEAST, CLUE: Clue Cells Wet Prep HPF POC: NONE SEEN

## 2012-02-26 MED ORDER — CLINDAMYCIN PHOSPHATE 2 % VA CREA: 1.0000 | TOPICAL_CREAM | Freq: Every day | VAGINAL | Status: AC

## 2012-02-26 NOTE — Patient Instructions (Addendum)
Use Cleocin vaginal cream nightly x1 week.  Call if symptoms persist or recur.

## 2012-02-26 NOTE — Progress Notes (Signed)
Patient follows up with vaginal itching. She saw Harriett Sine 02/18/2012 and was given Diflucan 200mg   #10 for itching. The wet prep and reviewed did not show yeast and bacteria but clinically was suspicious for yeast. Patient relates that the symptoms have not improved. She does note having received a steroid back injection recently. His long history of recurrent yeast vaginitis.  Exam with Sherrilyn Rist assistant External BUS vagina was atrophic genital changes. Slight white discharge. Cervix normal. Bimanual without masses or tenderness.  Assessment and plan: Persistent vaginal itching despite Diflucan treatment. Wet prep suspicious for bacterial vaginosis. We'll treat with Cleocin vaginal cream nightly x1 week, call if symptoms persist or recur.

## 2012-02-28 ENCOUNTER — Other Ambulatory Visit: Payer: Self-pay | Admitting: Family Medicine

## 2012-02-28 NOTE — Telephone Encounter (Signed)
refill benazepril 20mg  #30, last fill 5.8.23, last ov 6.25.13 Take one tablet by mouth daily

## 2012-02-29 MED ORDER — BENAZEPRIL HCL 20 MG PO TABS: 20.0000 mg | ORAL_TABLET | Freq: Every day | ORAL | Status: DC

## 2012-02-29 NOTE — Telephone Encounter (Signed)
rx sent to pharmacy by e-script  

## 2012-03-03 ENCOUNTER — Encounter: Payer: Self-pay | Admitting: Gynecology

## 2012-03-03 ENCOUNTER — Ambulatory Visit (INDEPENDENT_AMBULATORY_CARE_PROVIDER_SITE_OTHER): Payer: Medicare Other | Admitting: Gynecology

## 2012-03-03 DIAGNOSIS — N898 Other specified noninflammatory disorders of vagina: Secondary | ICD-10-CM

## 2012-03-03 DIAGNOSIS — L293 Anogenital pruritus, unspecified: Secondary | ICD-10-CM

## 2012-03-03 DIAGNOSIS — N952 Postmenopausal atrophic vaginitis: Secondary | ICD-10-CM

## 2012-03-03 MED ORDER — METRONIDAZOLE 500 MG PO TABS: 500.0000 mg | ORAL_TABLET | Freq: Two times a day (BID) | ORAL | Status: AC

## 2012-03-03 MED ORDER — TERCONAZOLE 0.4 % VA CREA: 1.0000 | TOPICAL_CREAM | Freq: Every day | VAGINAL | Status: AC

## 2012-03-03 NOTE — Patient Instructions (Signed)
Take Flagyl (metronidazole) twice daily for 7 days. Avoid alcohol while taking. Use Terazol vaginal cream nightly for 7 days. Follow up if symptoms persist or recur.

## 2012-03-03 NOTE — Progress Notes (Signed)
Patient presents complaining of persistent vulvar itching. She was treated for bacterial vaginosis a week ago with Cleocin vaginal cream noting initially a response with improvement but then got another steroid injection and said that her symptoms seem to worsen over the last several days.  Exam of Sherrilyn Rist assistant Pelvic: External with atrophic changes. Vagina with white cream. Bimanual without masses or tenderness  Assessment and plan: Vulvar itching initially responded to Cleocin vaginal cream with working diagnosis of bacterial vaginosis. We'll switch to Flagyl 500 twice a day x7 days, alcohol avoidance. Cover for yeast with Terazol 7 day cream nightly internal/external. Follow up if symptoms persist or recur. I did not do a wet prep given the cream in the vagina.

## 2012-03-17 ENCOUNTER — Ambulatory Visit (INDEPENDENT_AMBULATORY_CARE_PROVIDER_SITE_OTHER): Payer: Medicare Other | Admitting: Family Medicine

## 2012-03-17 ENCOUNTER — Encounter: Payer: Self-pay | Admitting: Family Medicine

## 2012-03-17 VITALS — BP 127/78 | HR 100 | Temp 98.2°F | Ht 61.2 in | Wt 174.2 lb

## 2012-03-17 DIAGNOSIS — F329 Major depressive disorder, single episode, unspecified: Secondary | ICD-10-CM

## 2012-03-17 MED ORDER — HYDROCODONE-ACETAMINOPHEN 5-500 MG PO TABS: 1.0000 | ORAL_TABLET | Freq: Four times a day (QID) | ORAL | Status: DC | PRN

## 2012-03-17 MED ORDER — BUPROPION HCL ER (XL) 150 MG PO TB24: 150.0000 mg | ORAL_TABLET | Freq: Every day | ORAL | Status: DC

## 2012-03-17 NOTE — Patient Instructions (Addendum)
Follow up in 4-6 weeks to recheck mood Start Wellbutrin on Saturday- allow the Klonopin to wash out of the system Take benadryl as needed for redness and itching Call with any questions or concerns Hang in there!! Happy Early [place] Ouch!!!

## 2012-03-17 NOTE — Progress Notes (Signed)
  Subjective:    Patient ID: Sarah Mcguire, female    DOB: 1941/05/21, 71 y.o.   MRN: [id number]  HPI Anxiety- pt was started on Klonopin at last visit.  Took meds for 'a week or 2' w/ good results and then 'things started happening'.  Unsteadiness, increased saliva, nausea, runny nose, dizziness, itching, hot, HA- all sxs pt was experiencing and all were found in side effect profile for Klonopin.  Pt started weaning meds and stopped it Saturday.  sxs have improved since stopping meds.   Review of Systems For ROS see HPI     Objective:   Physical Exam  Vitals reviewed. Constitutional: She appears well-developed and well-nourished. No distress.  Psychiatric: She has a normal mood and affect. Her behavior is normal. Thought content normal.          Assessment & Plan:

## 2012-03-18 NOTE — Assessment & Plan Note (Signed)
Unchanged.  Pt continues to have severe anxiety but is having difficulty taking any med prescribed for this.  Does well on xanax but has multiple reported side effects from klonopin.  Did not tolerate cymbalta from psychiatrist and had bad experience there- not interested in returning.  Has not tolerated traditional SSRIs in the past either.  Will attempt to control sxs w/ daily wellbutrin.  Will continue to follow this difficult situation closely.

## 2012-03-21 ENCOUNTER — Emergency Department (HOSPITAL_BASED_OUTPATIENT_CLINIC_OR_DEPARTMENT_OTHER)
Admission: EM | Admit: 2012-03-21 | Discharge: 2012-03-21 | Disposition: A | Discharge status: Home / Self Care | Payer: Medicare Other | Attending: Emergency Medicine | Admitting: Emergency Medicine

## 2012-03-21 ENCOUNTER — Encounter (HOSPITAL_BASED_OUTPATIENT_CLINIC_OR_DEPARTMENT_OTHER): Payer: Self-pay | Admitting: Emergency Medicine

## 2012-03-21 ENCOUNTER — Emergency Department (HOSPITAL_BASED_OUTPATIENT_CLINIC_OR_DEPARTMENT_OTHER): Payer: Medicare Other

## 2012-03-21 DIAGNOSIS — F411 Generalized anxiety disorder: Secondary | ICD-10-CM | POA: Insufficient documentation

## 2012-03-21 DIAGNOSIS — E1169 Type 2 diabetes mellitus with other specified complication: Secondary | ICD-10-CM | POA: Insufficient documentation

## 2012-03-21 DIAGNOSIS — Z79899 Other long term (current) drug therapy: Secondary | ICD-10-CM | POA: Insufficient documentation

## 2012-03-21 DIAGNOSIS — Z7982 Long term (current) use of aspirin: Secondary | ICD-10-CM | POA: Insufficient documentation

## 2012-03-21 DIAGNOSIS — F17200 Nicotine dependence, unspecified, uncomplicated: Secondary | ICD-10-CM | POA: Insufficient documentation

## 2012-03-21 DIAGNOSIS — K219 Gastro-esophageal reflux disease without esophagitis: Secondary | ICD-10-CM | POA: Insufficient documentation

## 2012-03-21 DIAGNOSIS — I10 Essential (primary) hypertension: Secondary | ICD-10-CM | POA: Insufficient documentation

## 2012-03-21 DIAGNOSIS — R739 Hyperglycemia, unspecified: Secondary | ICD-10-CM

## 2012-03-21 DIAGNOSIS — F419 Anxiety disorder, unspecified: Secondary | ICD-10-CM

## 2012-03-21 LAB — CBC WITH DIFFERENTIAL/PLATELET
HCT: 42.2 % (ref 36.0–46.0)
Hemoglobin: 14.3 g/dL (ref 12.0–15.0)
Lymphs Abs: 1.4 10*3/uL (ref 0.7–4.0)
MCH: 29.9 pg (ref 26.0–34.0)
MCHC: 33.9 g/dL (ref 30.0–36.0)
Monocytes Absolute: 1 10*3/uL (ref 0.1–1.0)
Monocytes Relative: 8 % (ref 3–12)
Neutro Abs: 10 10*3/uL — ABNORMAL HIGH (ref 1.7–7.7)
Neutrophils Relative %: 81 % — ABNORMAL HIGH (ref 43–77)
RBC: 4.79 MIL/uL (ref 3.87–5.11)

## 2012-03-21 LAB — BASIC METABOLIC PANEL
BUN: 36 mg/dL — ABNORMAL HIGH (ref 6–23)
Chloride: 102 mEq/L (ref 96–112)
Creatinine, Ser: 0.9 mg/dL (ref 0.50–1.10)
Glucose, Bld: 207 mg/dL — ABNORMAL HIGH (ref 70–99)
Potassium: 4.8 mEq/L (ref 3.5–5.1)

## 2012-03-21 MED ORDER — LORAZEPAM 1 MG PO TABS
ORAL_TABLET | ORAL | Status: AC
  Administered 2012-03-21: 1 mg via ORAL
  Filled 2012-03-21: qty 1

## 2012-03-21 MED ORDER — LORAZEPAM 1 MG PO TABS
1.0000 mg | ORAL_TABLET | Freq: Once | ORAL | Status: AC
  Administered 2012-03-21: 1 mg via ORAL

## 2012-03-21 MED ORDER — LORAZEPAM 2 MG/ML IJ SOLN: 1.0000 mg | Freq: Once | INTRAMUSCULAR | Status: DC

## 2012-03-21 MED ORDER — SODIUM CHLORIDE 0.9 % IV BOLUS (SEPSIS): 1000.0000 mL | Freq: Once | INTRAVENOUS | Status: DC

## 2012-03-21 NOTE — ED Notes (Signed)
Started new medication wellbutrin today, developed anxiety attack and called ems secondary to feeling anxious

## 2012-03-21 NOTE — ED Provider Notes (Signed)
History     CSN: [id number]  Arrival date & time 03/21/12  2033   First MD Initiated Contact with Patient 03/21/12 2039      Chief Complaint  Patient presents with  . Anxiety    (Consider location/radiation/quality/duration/timing/severity/associated sxs/prior treatment) HPI Patient complaining of anxiety. She states that she has been having panic attacks for an extended period of time and was on medicine for this but continued to have anxiety. She stopped the last medicine and began Wellbutrin today. She states she is continued to have feelings of nervousness. She also states that her blood sugar was elevated at 200 which is extremely high for her. She states this has happened before when she had steroid injections of her knee which she had yesterday. She has no other complaints at this time and denies chest pain but states she is slightly short of breath. He is a smoker but denies any recent coughing or fever.  Past Medical History  Diagnosis Date  . Hyperlipidemia   . Hypertension   . Hyperglycemia   . GERD (gastroesophageal reflux disease)   . Diabetes mellitus   . Anemia   . Leukocytosis   . Diverticulitis   . Neuropathy     Past Surgical History  Procedure Date  . Tonsillectomy   . Tympanostomy tube placement     R ear    Family History  Problem Relation Age of Onset  . Diabetes Mother   . Hypertension Mother   . Heart disease Father     MI age 69's   . Diabetes Brother     History  Substance Use Topics  . Smoking status: Current Everyday Smoker -- 0.8 packs/day    Types: Cigarettes  . Smokeless tobacco: Never Used  . Alcohol Use: No    OB History    Grav Para Term Preterm Abortions TAB SAB Ect Mult Living   2 2 2       2       Review of Systems  All other systems reviewed and are negative.    Allergies  Amoxicillin; Amoxicillin-pot clavulanate; Citalopram hydrobromide; Clonazepam; Cymbalta; Macrobid; Penicillins; Pyridium; Zonegran; Zonisamide;  Bupropion; and Sertraline hcl  Home Medications   Current Outpatient Rx  Name Route Sig Dispense Refill  . ALPRAZOLAM 2 MG PO TABS Oral Take 0.5-1 tablets (1-2 mg total) by mouth 3 (three) times daily as needed. For anxiety/sleep. 90 tablet 3  . AMLODIPINE BESYLATE 5 MG PO TABS Oral Take 1 tablet (5 mg total) by mouth daily. 90 tablet 1  . ASPIRIN 325 MG PO TABS Oral Take 650 mg by mouth every 6 (six) hours as needed. For pain.    Marland Kitchen BENAZEPRIL HCL 20 MG PO TABS Oral Take 1 tablet (20 mg total) by mouth daily. 90 tablet 3  . ONETOUCH ULTRA SYSTEM W/DEVICE KIT Does not apply 1 kit by Does not apply route once.      . BUPROPION HCL ER (XL) 150 MG PO TB24 Oral Take 1 tablet (150 mg total) by mouth daily. 30 tablet 3  . CALCIUM + D PO Oral Take 1 tablet by mouth daily.    Marland Kitchen VITAMIN D 2000 UNITS PO TABS Oral Take 2,000 Units by mouth daily.    Marland Kitchen ESOMEPRAZOLE MAGNESIUM 40 MG PO CPDR Oral Take 40 mg by mouth daily as needed. For acid reflux.    . FENOFIBRATE 160 MG PO TABS Oral Take 1 tablet (160 mg total) by mouth daily. 90 tablet 1  .  FOLIC ACID 1 MG PO TABS Oral Take 1 mg by mouth daily.    Marland Kitchen GLUCOSE BLOOD VI STRP  1-2 times daily as directed Use as instructed 100 each 3  . HYDROCODONE-ACETAMINOPHEN 5-500 MG PO TABS Oral Take 1 tablet by mouth every 6 (six) hours as needed. For pain.    Marland Kitchen LIDODERM 5 % EX PTCH Transdermal Place 1 patch onto the skin daily as needed. Apply 1 patch for 12 hours and remove for 12 hours; for pain.    Marland Kitchen ONE-DAILY MULTI VITAMINS PO TABS Oral Take 1 tablet by mouth daily.      Letta Pate LANCETS MISC Does not apply by Does not apply route daily. 1-2 times daily     . SIMVASTATIN 20 MG PO TABS Oral Take 1 tablet (20 mg total) by mouth at bedtime. 90 tablet 1  . TRIMETHOPRIM 100 MG PO TABS Oral Take 100 mg by mouth daily.    . TERCONAZOLE 0.4 % VA CREA        BP 118/66  Pulse 97  Temp 97.5 F (36.4 C) (Oral)  Resp 20  SpO2 97%  Physical Exam  Nursing note and  vitals reviewed. Constitutional: She appears well-developed and well-nourished.  HENT:  Head: Normocephalic and atraumatic.  Eyes: Conjunctivae and EOM are normal. Pupils are equal, round, and reactive to light.  Neck: Normal range of motion. Neck supple.  Cardiovascular: Normal rate, regular rhythm, normal heart sounds and intact distal pulses.   Pulmonary/Chest: Effort normal and breath sounds normal.  Abdominal: Soft. Bowel sounds are normal.  Musculoskeletal: Normal range of motion.  Neurological: She is alert.  Skin: Skin is warm and dry.  Psychiatric: Thought content normal.       Anxious appearing    ED Course  Procedures (including critical care time)  Labs Reviewed  CBC WITH DIFFERENTIAL - Abnormal; Notable for the following:    WBC 12.4 (*)     RDW 15.7 (*)     Neutrophils Relative 81 (*)     Neutro Abs 10.0 (*)     Lymphocytes Relative 11 (*)     All other components within normal limits  BASIC METABOLIC PANEL - Abnormal; Notable for the following:    Glucose, Bld 207 (*)     BUN 36 (*)     GFR calc non Af Amer 63 (*)     GFR calc Af Amer 73 (*)     All other components within normal limits   Dg Chest 2 View  03/21/2012  *RADIOLOGY REPORT*  Clinical Data: Shortness of breath.  CHEST - 2 VIEW  Comparison: 02/13/2012  Findings: Normal heart size and pulmonary vascularity.  No focal airspace consolidation in the lungs.  No blunting of costophrenic angles.  No pneumothorax.  Calcification of the aorta.  Mild degenerative changes in the spine.  IMPRESSION: No evidence of active pulmonary disease.  Original Report Authenticated By: Marlon Pel, M.D.     No diagnosis found.   Date: 03/21/2012  Rate: 78  Rhythm: normal sinus rhythm  QRS Axis: normal  Intervals: normal  ST/T Wave abnormalities: normal  Conduction Disutrbances: none  Narrative Interpretation: unremarkable      MDM  Patient with improved symptoms after Ativan. Blood sugar is elevated the  patient has known history of diabetes and received steroid injection yesterday. She is advised to drink plenty of fluids take her medicines as usual and recheck tomorrow in the next day.  Hilario Quarry, MD 03/21/12 2151

## 2012-03-21 NOTE — ED Notes (Signed)
Reports starting new medication "wellbutrin", and developed anxiety type symptoms

## 2012-03-21 NOTE — ED Notes (Signed)
Glucose 221 en route via ems

## 2012-03-25 LAB — GLUCOSE, CAPILLARY: Glucose-Capillary: 200 mg/dL — ABNORMAL HIGH (ref 70–99)

## 2012-03-28 ENCOUNTER — Encounter: Payer: Self-pay | Admitting: Family Medicine

## 2012-03-28 ENCOUNTER — Ambulatory Visit (INDEPENDENT_AMBULATORY_CARE_PROVIDER_SITE_OTHER): Payer: Medicare Other | Admitting: Family Medicine

## 2012-03-28 VITALS — BP 121/72 | HR 100 | Temp 98.8°F | Ht 62.25 in | Wt 175.0 lb

## 2012-03-28 DIAGNOSIS — F329 Major depressive disorder, single episode, unspecified: Secondary | ICD-10-CM

## 2012-03-28 MED ORDER — HYDROXYZINE HCL 10 MG PO TABS: 10.0000 mg | ORAL_TABLET | Freq: Three times a day (TID) | ORAL | Status: AC | PRN

## 2012-03-28 MED ORDER — LORAZEPAM 0.5 MG PO TABS: 0.5000 mg | ORAL_TABLET | Freq: Two times a day (BID) | ORAL | Status: AC | PRN

## 2012-03-28 NOTE — Progress Notes (Signed)
  Subjective:    Patient ID: [name], female    DOB: 05-01-1941, 71 y.o.   MRN: [id number]  HPI Anxiety- 'that medicine didn't work' (Wellbutrin).  Has been on Celexa, Cymbalta, Zoloft, Wellbutrin, Klonopin- all w/ side effects.  Pt continues to have anxiety- requiring ER visits.  Reports rapid heart rate, itching, hot flashes.  Wakes up 'jittery and shaking on the inside' but this is not related to her sugar- as confirmed by endo referral.  Husband is exasperated, pt is frustrated.   Review of Systems For ROS see HPI     Objective:   Physical Exam  Vitals reviewed. Constitutional: She appears well-developed and well-nourished.  Psychiatric:       Pt withdrawn today.  Seems angry.          Assessment & Plan:

## 2012-03-28 NOTE — Patient Instructions (Addendum)
Call Dr Donell Beers to schedule your appt Take the Ativan as needed for anxiety Try and relax!!!  Don't worry about the sugar- we monitor this for you! Call with any questions or concerns Hang in there!!!

## 2012-04-03 ENCOUNTER — Telehealth: Payer: Self-pay | Admitting: Family Medicine

## 2012-04-03 NOTE — Telephone Encounter (Signed)
Caller: Stephonie/Patient; PCP: Sheliah Hatch.; CB#: [phone];   Call regarding Headaches and Panic Attack;   She wants to know if headaches go along with Panic Attacks?  Reviewed information on Anxiety/ Panic attacks from [facility].  She was seen in office on 03/29/12 by the physician and placed on Ativan.  Headache is located at the forehead, intermittent in nature. she states she feels like this is her normal sinus headache and she has sinus issues frequently.  No coughing or sneezing, + nasal congestion.  [facility] always  helps her and is curious if she can take the medication to stop any sinus pressure. Recommend that she speak with her pharmacy regarding drug formulary and medication interaction.  Anxiety information reviewed with patient.  She has appt with psychiatrist 05/19/12 , she believes that it is too long to wait. Emergent s/sx ruled out per Anxiety Protocol and Headache Protocol with home care advised.  They would like assistance with counseling or getting in with the psychiatrist earlier.  Advised that I would forward request.  Encouraged her to follow up  wtih Dr. Beverely Low for worsening signs and symptms in 72 hours if needed.  Understanding expressed by husband Denton Lank.

## 2012-04-03 NOTE — Telephone Encounter (Signed)
Please advise if there is a way to rush an apt for psychiatrist, per unaware of this process and MD Beverely Low out of office til tuesday

## 2012-04-04 NOTE — Telephone Encounter (Signed)
The psychiatry offices require that the patient's make their own appointments, I do not make these.  To be seen sooner, she can call the current office she is scheduled with in September & ask if there is anyone else in the practice who can see her sooner.  Lastly she can always call and check with other psych practices.

## 2012-04-04 NOTE — Telephone Encounter (Signed)
Advised pt to refer to the list she was given at office visit, pt asked if I could speak to her husband instead, pt husband advised that he has the list and does not understand why they will not rush through if a doctor's office calls them, I advised the [facility] office have different protocols than other offices that we can refer to, the pt husband understood and will try to call all the other numbers on the list, encouraged pt husband to call all the numbers in case anyone has a cancellation per people cancel all the time this time of year due to vacation and multiple reasons, pt husband then stated he will call us back if he can not get an apt, advised that the next step would be to advise services in another county, pt husband understood and will call back if further assistance needed.

## 2012-04-04 NOTE — Telephone Encounter (Signed)
.  left message to have patient return my call.  

## 2012-04-08 NOTE — Assessment & Plan Note (Signed)
Deteriorated.  Pt is unable to tolerate each controller medication attempted.  Can take xanax but not klonopin.  Husband seems to be pushing ativan b/c this worked for her once in the hospital.  Pt is unhappy w/ the way this is going, as am I.  Stressed that I have tried everything I know to do and at this point, whether they like it or not, we need to seek the aid of a psychiatrist.  Recommended Dr Donell Beers.  Will follow.

## 2012-04-10 ENCOUNTER — Ambulatory Visit: Payer: Medicare Other | Admitting: Family Medicine

## 2012-04-11 ENCOUNTER — Telehealth: Payer: Self-pay | Admitting: *Deleted

## 2012-04-11 NOTE — Telephone Encounter (Signed)
Called to speak with MD Plovosky nurse to advise MD Tabori notations about pt recent OV with him, spoke to assistance Ocean whom advised that protocol to have any notations from MD on mutual pt be left on his personal VM, left detailed message and advised we will fax notations as well. Leotis Shames will be looking for the incoming fax. MD Beverely Low made aware verbally.

## 2012-04-14 ENCOUNTER — Encounter: Payer: Self-pay | Admitting: Family Medicine

## 2012-04-14 ENCOUNTER — Ambulatory Visit (INDEPENDENT_AMBULATORY_CARE_PROVIDER_SITE_OTHER): Payer: Medicare Other | Admitting: Family Medicine

## 2012-04-14 VITALS — BP 128/80 | HR 100 | Temp 98.2°F | Ht 63.0 in | Wt 170.6 lb

## 2012-04-14 DIAGNOSIS — E119 Type 2 diabetes mellitus without complications: Secondary | ICD-10-CM

## 2012-04-14 DIAGNOSIS — F341 Dysthymic disorder: Secondary | ICD-10-CM

## 2012-04-14 DIAGNOSIS — J019 Acute sinusitis, unspecified: Secondary | ICD-10-CM

## 2012-04-14 DIAGNOSIS — F329 Major depressive disorder, single episode, unspecified: Secondary | ICD-10-CM

## 2012-04-14 LAB — BASIC METABOLIC PANEL
BUN: 29 mg/dL — ABNORMAL HIGH (ref 6–23)
CO2: 28 mEq/L (ref 19–32)
Chloride: 104 mEq/L (ref 96–112)
Glucose, Bld: 88 mg/dL (ref 70–99)
Potassium: 4 mEq/L (ref 3.5–5.1)

## 2012-04-14 MED ORDER — DOXYCYCLINE HYCLATE 100 MG PO TABS: 100.0000 mg | ORAL_TABLET | Freq: Two times a day (BID) | ORAL | Status: AC

## 2012-04-14 MED ORDER — FLUCONAZOLE 200 MG PO TABS: 200.0000 mg | ORAL_TABLET | Freq: Every day | ORAL | Status: AC

## 2012-04-14 NOTE — Progress Notes (Signed)
  Subjective:    Patient ID: Sarah Mcguire, female    DOB: 1941-01-19, 71 y.o.   MRN: [id number]  HPI DM- chronic problem, diet controlled.  Pt reports she's 'about to faint away' b/c she's fasting this AM.  CBG 86.  Pt given saltines to eat in office.  CBGs running 90-120.  Denies N/V/D, due for eye exam.  'i have a sinus infection'- bilateral ear pain, sinus pressure.  No cough.  No fever.  sxs started 1 week ago.  Saw ENT- was told she had wax in ear and was started on nasal spray to improve 'sinus inflammation'.  Hx of similar.  Anxiety- has seen psych once and therapist once.  They agree that she has severe anxiety but also feels that a lot of sxs stem from depression.  Pt likes Dr Donell Beers.   Review of Systems For ROS see HPI     Objective:   Physical Exam  Vitals reviewed. Constitutional: She appears well-developed and well-nourished. No distress.  HENT:  Head: Normocephalic and atraumatic.  Right Ear: Tympanic membrane normal.  Left Ear: Tympanic membrane normal.  Nose: Mucosal edema and rhinorrhea present. Right sinus exhibits maxillary sinus tenderness and frontal sinus tenderness. Left sinus exhibits maxillary sinus tenderness and frontal sinus tenderness.  Mouth/Throat: Uvula is midline and mucous membranes are normal. Posterior oropharyngeal erythema present. No oropharyngeal exudate.  Eyes: Conjunctivae and EOM are normal. Pupils are equal, round, and reactive to light.  Neck: Normal range of motion. Neck supple.  Cardiovascular: Normal rate, regular rhythm and normal heart sounds.   Pulmonary/Chest: Effort normal and breath sounds normal. No respiratory distress. She has no wheezes.  Lymphadenopathy:    She has no cervical adenopathy.          Assessment & Plan:

## 2012-04-14 NOTE — Assessment & Plan Note (Signed)
Unchanged.  Pt now seeing Dr Donell Beers and finally seems happy w/ this.  Also in therapy.  Will continue to follow along and assist as able.

## 2012-04-14 NOTE — Assessment & Plan Note (Signed)
Pt's sxs and PE consistent w/ infxn.  Start abx.  Reviewed supportive care and red flags that should prompt return.  Pt expressed understanding and is in agreement w/ plan.  

## 2012-04-14 NOTE — Patient Instructions (Addendum)
Follow up in November to recheck diabetes and cholesterol Start the Doxy twice daily for the sinus infection- take w/ food Drink plenty of water to prevent overdrying the sinuses Call with any questions or concerns Happy Bel[employer] Birthday!!!

## 2012-04-14 NOTE — Assessment & Plan Note (Signed)
Unchanged.  Pt's CBGs well controlled based on values brought today.  Pt feeling hypoglycemic this AM due to fasting.  Given crackers to eat in office and sxs improved.  Check labs.

## 2012-05-07 ENCOUNTER — Ambulatory Visit
Admission: RE | Admit: 2012-05-07 | Discharge: 2012-05-07 | Disposition: A | Discharge status: Home / Self Care | Payer: Medicare Other | Source: Ambulatory Visit | Attending: Physical Medicine and Rehabilitation | Admitting: Physical Medicine and Rehabilitation

## 2012-05-07 ENCOUNTER — Other Ambulatory Visit: Payer: Self-pay | Admitting: Physical Medicine and Rehabilitation

## 2012-05-07 DIAGNOSIS — M79661 Pain in right lower leg: Secondary | ICD-10-CM

## 2012-05-08 ENCOUNTER — Other Ambulatory Visit: Payer: Medicare Other

## 2012-05-15 ENCOUNTER — Telehealth: Payer: Self-pay | Admitting: Family Medicine

## 2012-05-15 MED ORDER — SIMVASTATIN 20 MG PO TABS: 20.0000 mg | ORAL_TABLET | Freq: Every day | ORAL | Status: DC

## 2012-05-15 MED ORDER — FOLIC ACID 1 MG PO TABS: 1.0000 mg | ORAL_TABLET | Freq: Every day | ORAL | Status: DC

## 2012-05-15 NOTE — Telephone Encounter (Signed)
Refill: Folic acid 1mg . Take one tablet by mouth daily. Qty 30. Last fill 04-16-12

## 2012-05-15 NOTE — Telephone Encounter (Signed)
Refill: Simvastatin 20mg  tab. Take 1 tablet by mouth at bedtime. Qty 90. Last fill 01-11-12

## 2012-05-15 NOTE — Telephone Encounter (Signed)
rx sent to pharmacy by e-script for 90 day supply per pt request

## 2012-05-21 ENCOUNTER — Encounter: Payer: Self-pay | Admitting: Family Medicine

## 2012-05-21 ENCOUNTER — Ambulatory Visit (INDEPENDENT_AMBULATORY_CARE_PROVIDER_SITE_OTHER): Payer: Medicare Other | Admitting: Family Medicine

## 2012-05-21 VITALS — BP 127/79 | HR 94 | Temp 97.2°F | Ht 63.0 in

## 2012-05-21 DIAGNOSIS — N390 Urinary tract infection, site not specified: Secondary | ICD-10-CM

## 2012-05-21 LAB — POCT URINALYSIS DIPSTICK
Bilirubin, UA: NEGATIVE
Blood, UA: NEGATIVE
Ketones, UA: NEGATIVE
Spec Grav, UA: 1.02
pH, UA: 6.5

## 2012-05-21 MED ORDER — CEPHALEXIN 500 MG PO CAPS: 500.0000 mg | ORAL_CAPSULE | Freq: Two times a day (BID) | ORAL | Status: DC

## 2012-05-21 NOTE — Patient Instructions (Addendum)
Start the Keflex twice daily for presumed UTI We'll call you with your culture results Drink plenty of fluids We'll send a copy of today's note to urology Call with any questions or concerns Hang in there!!

## 2012-05-21 NOTE — Progress Notes (Signed)
  Subjective:    Patient ID: Sarah Mcguire, female    DOB: 1941/01/04, 71 y.o.   MRN: [id number]  HPI Urinary frequency- sxs started last week, unable to determine if dysuria b/c 'my leg has hurt so bad i don't know'.  + urgency.  No hesitancy.  No fevers.  Hx of similar.  Still taking trimethoprim 100mg  daily from urology.   Review of Systems For ROS see HPI     Objective:   Physical Exam  Vitals reviewed. Constitutional: She appears well-developed and well-nourished. No distress.  Abdominal: Soft. She exhibits no distension. There is no tenderness (no suprapubic or CVA tenderness).          Assessment & Plan:

## 2012-05-21 NOTE — Assessment & Plan Note (Signed)
Recurrent sxs despite daily suppressive med from urology.  Start abx.  Await urine culture.  Will alert urology to ongoing issue.

## 2012-05-23 ENCOUNTER — Encounter: Payer: Self-pay | Admitting: Family Medicine

## 2012-05-30 ENCOUNTER — Encounter: Payer: Self-pay | Admitting: Gynecology

## 2012-05-30 ENCOUNTER — Ambulatory Visit (INDEPENDENT_AMBULATORY_CARE_PROVIDER_SITE_OTHER): Payer: Medicare Other | Admitting: Gynecology

## 2012-05-30 DIAGNOSIS — L293 Anogenital pruritus, unspecified: Secondary | ICD-10-CM

## 2012-05-30 DIAGNOSIS — N898 Other specified noninflammatory disorders of vagina: Secondary | ICD-10-CM

## 2012-05-30 DIAGNOSIS — R350 Frequency of micturition: Secondary | ICD-10-CM

## 2012-05-30 DIAGNOSIS — N390 Urinary tract infection, site not specified: Secondary | ICD-10-CM

## 2012-05-30 LAB — URINALYSIS W MICROSCOPIC + REFLEX CULTURE
Hgb urine dipstick: NEGATIVE
Ketones, ur: NEGATIVE mg/dL
Nitrite: NEGATIVE
Protein, ur: NEGATIVE mg/dL

## 2012-05-30 LAB — WET PREP FOR TRICH, YEAST, CLUE
Trich, Wet Prep: NONE SEEN
Yeast Wet Prep HPF POC: NONE SEEN

## 2012-05-30 MED ORDER — FLUCONAZOLE 200 MG PO TABS: 200.0000 mg | ORAL_TABLET | Freq: Every day | ORAL | Status: DC

## 2012-05-30 MED ORDER — CIPROFLOXACIN HCL 250 MG PO TABS: 250.0000 mg | ORAL_TABLET | Freq: Two times a day (BID) | ORAL | Status: DC

## 2012-05-30 NOTE — Patient Instructions (Signed)
Take antibiotic twice daily for 3 days for the urine. Take Diflucan daily for 10 days. Stop statin drug during this time. Follow up as needed.

## 2012-05-30 NOTE — Progress Notes (Signed)
Patient presents complaining of urinary urgency and vaginal itching. Recently treated for stress fracture with boot. Had multiple steroid injections into her knee. No fever chills or other constitutional symptoms. No low back pain dysuria or urgency.  Has been treated for recurrent yeast and BV of the past year or 2.  Exam with [employer] assistant External BUS vagina with atrophic changes white discharge noted.  Assessment and plan: 1. Vaginal itching and irritation. Patient actually used a Surveyor, quantity she had at home of the last 2 days without relief. Wet prep is unremarkable. We'll cover for yeast with Diflucan 200 daily for 10 days one refill which is helped in the past. Need to stop statin drug while taking reviewed. 2. Frequency. Urinalysis with low-grade changes with several white cells and few bacteria. Will cover for UTI with ciprofloxacin 250 twice a day x3 days given it is Friday afternoon and I do not want this to worsen over the weekend. Follow up as needed.

## 2012-06-01 LAB — URINE CULTURE: Colony Count: 30000

## 2012-06-02 ENCOUNTER — Encounter: Payer: Self-pay | Admitting: Family Medicine

## 2012-06-02 ENCOUNTER — Ambulatory Visit (INDEPENDENT_AMBULATORY_CARE_PROVIDER_SITE_OTHER): Payer: Medicare Other | Admitting: Family Medicine

## 2012-06-02 VITALS — BP 122/78 | HR 80 | Temp 98.2°F | Ht 63.0 in

## 2012-06-02 DIAGNOSIS — R109 Unspecified abdominal pain: Secondary | ICD-10-CM

## 2012-06-02 LAB — POCT URINALYSIS DIPSTICK
Bilirubin, UA: NEGATIVE
Blood, UA: NEGATIVE
Glucose, UA: NEGATIVE
Ketones, UA: NEGATIVE
Nitrite, UA: NEGATIVE
Spec Grav, UA: 1.01
pH, UA: 6.5

## 2012-06-02 NOTE — Progress Notes (Signed)
  Subjective:    Patient ID: Sarah Mcguire, female    DOB: [birth year], 71 y.o.   MRN: [id number]  HPI ? Diverticulitis- has had 'an inkling' for a 'couple of days', this AM 'it really hurt' over suprapubic area.  No dysuria, frequency.  Had normal urine cx done on Friday w/ Dr Audie Box.  No nausea, vomiting, diarrhea.  No constipation.  No fevers.   Review of Systems For ROS see HPI     Objective:   Physical Exam  Vitals reviewed. Constitutional: She appears well-developed and well-nourished. No distress.  Cardiovascular: Normal rate, regular rhythm and normal heart sounds.   Pulmonary/Chest: Effort normal and breath sounds normal. No respiratory distress. She has no wheezes. She has no rales.  Abdominal: Soft. Bowel sounds are normal. She exhibits no distension and no mass. There is no tenderness. There is no rebound and no guarding.          Assessment & Plan:

## 2012-06-02 NOTE — Assessment & Plan Note (Signed)
Recurrent issue for pt.  Pt feels it 'has to be diverticulitis' despite no TTP over abdomen.  Pt's area of mild discomfort is suprapubic- which raises concern for possible recurrent UTI.  Check CBC to r/o bacterial infxn, UA to r/o UTI, LFTs and BMP.  No abx until labs available.  Pt expressed understanding and is in agreement w/ plan.

## 2012-06-02 NOTE — Addendum Note (Signed)
Addended by: Silvio Pate D on: 06/02/2012 05:14 PM   Modules accepted: Orders

## 2012-06-02 NOTE — Patient Instructions (Addendum)
We'll notify you of your lab results and determine the next steps Drink plenty of fluids Call with any questions or concerns Hang in there!!!

## 2012-06-03 LAB — HEPATIC FUNCTION PANEL
AST: 24 U/L (ref 0–37)
Alkaline Phosphatase: 39 U/L (ref 39–117)
Bilirubin, Direct: 0 mg/dL (ref 0.0–0.3)
Total Bilirubin: 0.2 mg/dL — ABNORMAL LOW (ref 0.3–1.2)

## 2012-06-03 LAB — CBC WITH DIFFERENTIAL/PLATELET
Basophils Absolute: 0 10*3/uL (ref 0.0–0.1)
Eosinophils Absolute: 0.1 10*3/uL (ref 0.0–0.7)
Eosinophils Relative: 0.8 % (ref 0.0–5.0)
Lymphs Abs: 1.3 10*3/uL (ref 0.7–4.0)
MCV: 93.4 fl (ref 78.0–100.0)
Monocytes Absolute: 0.7 10*3/uL (ref 0.1–1.0)
Neutrophils Relative %: 78 % — ABNORMAL HIGH (ref 43.0–77.0)
Platelets: 248 10*3/uL (ref 150.0–400.0)
RDW: 15.4 % — ABNORMAL HIGH (ref 11.5–14.6)
WBC: 9.6 10*3/uL (ref 4.5–10.5)

## 2012-06-03 LAB — URINE CULTURE: Organism ID, Bacteria: NO GROWTH

## 2012-06-03 LAB — BASIC METABOLIC PANEL
Calcium: 9.9 mg/dL (ref 8.4–10.5)
GFR: 48.79 mL/min — ABNORMAL LOW (ref >60.00)
Glucose, Bld: 116 mg/dL — ABNORMAL HIGH (ref 70–99)
Potassium: 4.8 mEq/L (ref 3.5–5.1)
Sodium: 135 mEq/L (ref 135–145)

## 2012-06-04 ENCOUNTER — Encounter: Payer: Self-pay | Admitting: Family Medicine

## 2012-06-04 NOTE — Telephone Encounter (Signed)
Please advise 

## 2012-06-04 NOTE — Telephone Encounter (Signed)
Noted ticket was placed for my chart release messaging per notation was made to pt on 06-03-12  sent pt another message with following notations from MD Tabori about her results:  Entered by Sheliah Hatch, MD at 06/03/2012 7:50 PM No evidence of bacterial infection  Liver function looks good  Slightly increased creatinine indicates need for increased fluids  Overall- everything looks great!   Apologized for pt inconvience and advised that the issue has been addressed

## 2012-06-05 ENCOUNTER — Other Ambulatory Visit: Payer: Self-pay | Admitting: Family Medicine

## 2012-06-05 MED ORDER — GLUCOSE BLOOD VI STRP: ORAL_STRIP | Status: DC

## 2012-06-05 NOTE — Telephone Encounter (Signed)
rx sent to pharmacy by e-script  

## 2012-06-05 NOTE — Telephone Encounter (Signed)
Refill one touch ultra stp tes life #100 lst fill 7.25.13 take one to two times daily as directed Last ov acute 9.30.13

## 2012-06-16 ENCOUNTER — Other Ambulatory Visit: Payer: Self-pay | Admitting: Family Medicine

## 2012-06-16 MED ORDER — ALPRAZOLAM 2 MG PO TABS: 1.0000 mg | ORAL_TABLET | Freq: Three times a day (TID) | ORAL | Status: DC | PRN

## 2012-06-16 NOTE — Telephone Encounter (Signed)
Refill: Alprazolam, 2mg  tab -- last fill 9.11.13, last wrt 6.13.13 #90

## 2012-06-16 NOTE — Telephone Encounter (Signed)
Last OV 06-02-12 last refill 02-14-12 #90 with 3 refills

## 2012-06-16 NOTE — Telephone Encounter (Signed)
refill Alprazolam, 2mg  tab -- last fill 9.11.13, last wrt 6.13.13 #90 wt/3-refills Last ov 9.30.13 acute

## 2012-06-16 NOTE — Telephone Encounter (Signed)
Ok for #90, 3 refills 

## 2012-06-16 NOTE — Telephone Encounter (Signed)
.  rx faxed to pharmacy, manually.  

## 2012-06-23 ENCOUNTER — Other Ambulatory Visit: Payer: Self-pay | Admitting: Orthopedic Surgery

## 2012-06-23 DIAGNOSIS — M79606 Pain in leg, unspecified: Secondary | ICD-10-CM

## 2012-06-25 ENCOUNTER — Ambulatory Visit: Payer: Medicare Other | Admitting: Internal Medicine

## 2012-06-25 ENCOUNTER — Telehealth: Payer: Self-pay | Admitting: Family Medicine

## 2012-06-25 ENCOUNTER — Encounter: Payer: Self-pay | Admitting: Internal Medicine

## 2012-06-25 ENCOUNTER — Ambulatory Visit (INDEPENDENT_AMBULATORY_CARE_PROVIDER_SITE_OTHER): Payer: Medicare Other | Admitting: Internal Medicine

## 2012-06-25 VITALS — BP 98/64 | HR 108 | Temp 98.3°F

## 2012-06-25 DIAGNOSIS — R002 Palpitations: Secondary | ICD-10-CM

## 2012-06-25 NOTE — Patient Instructions (Addendum)
Please get your blood work today, we'll schedule a CT tomorrow

## 2012-06-25 NOTE — Telephone Encounter (Signed)
MD Beverely Low made aware verbally that pt will be seen today by MD Drue Novel

## 2012-06-25 NOTE — Progress Notes (Signed)
  Subjective:    Patient ID: Sarah Mcguire, female    DOB: [birth year], 71 y.o.   MRN: [id number]  HPI Acute visit, she reports the following history: The patient sees orthopedic surgery, Dr. Renae Fickle, regularly. 5 weeks ago she was diagnosed with a stress fracture at the left leg and she was put on a boot which was  removed 3 days ago. Her main concern today is palpitations, she is having those symptoms for the last 5 weeks, on and off, symptoms described as her heart going very rapidly. She also has  severe anxiety, sometimes she believes the palpitations are triggered by anxiety. I notice her leg to be swollen, the patient reports that it was swollen even at the time she was  put   on the boot.   Past Medical History  Diagnosis Date  . Hyperlipidemia   . Hypertension   . Hyperglycemia   . GERD (gastroesophageal reflux disease)   . Diabetes mellitus   . Anemia   . Leukocytosis   . Diverticulitis   . Neuropathy   . Anxiety     SEVERE   Past Surgical History  Procedure Date  . Tonsillectomy   . Tympanostomy tube placement     R ear      Review of Systems Denies fever chills No cough, shortness of breath or chest pain She admits to panic attacks.     Objective:   Physical Exam  General -- alert, well-developed, and overweight appearing. No apparent distress.  Neck --no LADs HEENT -- not pale  Lungs -- normal respiratory effort, no intercostal retractions, no accessory muscle use, and normal breath sounds.   Heart-- tachycardic, regular ? Extremities--  R calf -- soft , not tender L calf-- swollen by about 1 inch compared to the right, she is tender at the lateral aspect of the distal pretibial area, according to the patient the pain is related to the stress fracture. Neurologic-- alert & oriented X3 and strength normal in all extremities. Psych-- Cognition and judgment appear intact. Alert and cooperative with normal attention span and concentration.  not anxious  appearing and not depressed appearing.       Assessment & Plan:

## 2012-06-25 NOTE — Telephone Encounter (Signed)
Caller: Mirranda/Patient; Patient Name: Sarah Mcguire; PCP: Sheliah Hatch.; Best Callback Phone Number: [phone]; Reason for call: She relates her pulse is running high from 123 to 130; onset "about 2 weeks".  Temp 98.7.    Oxygen 97 percent.  Current pulse 123. Denies symptoms of dehydration.   Other rmergent symptoms ruled out.  Caller denies other symptoms.  States she was sitting and felt heart rate increase.  Heart rate 112 during the call.  See provider in 4 hours per nursing judgment and Irregular Heartbeat protocol.   Reviewed EMR.  Appointment with Dr. Drue Novel at 15:15 for 30 minutes; none available with PCP.

## 2012-06-25 NOTE — Assessment & Plan Note (Addendum)
71 year old lady with history of anxiety and a recent stress fracture who presents with palpitations. EKG: Sinus tachycardia. No acute changes, not different from previous EKGs On exam, she has left lower extremity edema, chart is  reviewed, she had a negative ultrasound for DVT done last month for left leg edema. In the setting of leg edema and recent decreased physical activity, a PE must be rule out given palpitations and sinus tachycardia on the EKG. Differential diagnoses includes anxiety as well  Last TSH and CBC normal Plan: BMP today, CT tomorrow ER if symptoms severe.

## 2012-06-26 ENCOUNTER — Inpatient Hospital Stay (HOSPITAL_COMMUNITY)
Admission: EM | Admit: 2012-06-26 | Discharge: 2012-06-30 | DRG: 176 | Disposition: A | Discharge status: Home Health | Payer: Medicare Other | Attending: Internal Medicine | Admitting: Internal Medicine

## 2012-06-26 ENCOUNTER — Telehealth: Payer: Self-pay | Admitting: Internal Medicine

## 2012-06-26 ENCOUNTER — Ambulatory Visit (INDEPENDENT_AMBULATORY_CARE_PROVIDER_SITE_OTHER)
Admission: RE | Admit: 2012-06-26 | Discharge: 2012-06-26 | Disposition: A | Discharge status: Home / Self Care | Payer: Medicare Other | Source: Ambulatory Visit | Attending: Internal Medicine | Admitting: Internal Medicine

## 2012-06-26 ENCOUNTER — Encounter: Payer: Self-pay | Admitting: Internal Medicine

## 2012-06-26 ENCOUNTER — Encounter (HOSPITAL_COMMUNITY): Payer: Self-pay | Admitting: *Deleted

## 2012-06-26 DIAGNOSIS — M722 Plantar fascial fibromatosis: Secondary | ICD-10-CM

## 2012-06-26 DIAGNOSIS — R079 Chest pain, unspecified: Secondary | ICD-10-CM

## 2012-06-26 DIAGNOSIS — R002 Palpitations: Secondary | ICD-10-CM

## 2012-06-26 DIAGNOSIS — H6980 Other specified disorders of Eustachian tube, unspecified ear: Secondary | ICD-10-CM

## 2012-06-26 DIAGNOSIS — I509 Heart failure, unspecified: Secondary | ICD-10-CM

## 2012-06-26 DIAGNOSIS — Z881 Allergy status to other antibiotic agents status: Secondary | ICD-10-CM

## 2012-06-26 DIAGNOSIS — R609 Edema, unspecified: Secondary | ICD-10-CM

## 2012-06-26 DIAGNOSIS — R011 Cardiac murmur, unspecified: Secondary | ICD-10-CM

## 2012-06-26 DIAGNOSIS — I2692 Saddle embolus of pulmonary artery without acute cor pulmonale: Principal | ICD-10-CM | POA: Diagnosis present

## 2012-06-26 DIAGNOSIS — Z7901 Long term (current) use of anticoagulants: Secondary | ICD-10-CM

## 2012-06-26 DIAGNOSIS — J309 Allergic rhinitis, unspecified: Secondary | ICD-10-CM

## 2012-06-26 DIAGNOSIS — F410 Panic disorder [episodic paroxysmal anxiety] without agoraphobia: Secondary | ICD-10-CM

## 2012-06-26 DIAGNOSIS — E119 Type 2 diabetes mellitus without complications: Secondary | ICD-10-CM

## 2012-06-26 DIAGNOSIS — I10 Essential (primary) hypertension: Secondary | ICD-10-CM

## 2012-06-26 DIAGNOSIS — M549 Dorsalgia, unspecified: Secondary | ICD-10-CM

## 2012-06-26 DIAGNOSIS — F32A Depression, unspecified: Secondary | ICD-10-CM

## 2012-06-26 DIAGNOSIS — N760 Acute vaginitis: Secondary | ICD-10-CM

## 2012-06-26 DIAGNOSIS — R300 Dysuria: Secondary | ICD-10-CM

## 2012-06-26 DIAGNOSIS — F419 Anxiety disorder, unspecified: Secondary | ICD-10-CM

## 2012-06-26 DIAGNOSIS — E669 Obesity, unspecified: Secondary | ICD-10-CM

## 2012-06-26 DIAGNOSIS — I824Z9 Acute embolism and thrombosis of unspecified deep veins of unspecified distal lower extremity: Secondary | ICD-10-CM | POA: Diagnosis present

## 2012-06-26 DIAGNOSIS — Z8249 Family history of ischemic heart disease and other diseases of the circulatory system: Secondary | ICD-10-CM

## 2012-06-26 DIAGNOSIS — R5383 Other fatigue: Secondary | ICD-10-CM

## 2012-06-26 DIAGNOSIS — I81 Portal vein thrombosis: Secondary | ICD-10-CM

## 2012-06-26 DIAGNOSIS — Z833 Family history of diabetes mellitus: Secondary | ICD-10-CM

## 2012-06-26 DIAGNOSIS — K5732 Diverticulitis of large intestine without perforation or abscess without bleeding: Secondary | ICD-10-CM

## 2012-06-26 DIAGNOSIS — K750 Abscess of liver: Secondary | ICD-10-CM

## 2012-06-26 DIAGNOSIS — I82409 Acute embolism and thrombosis of unspecified deep veins of unspecified lower extremity: Secondary | ICD-10-CM

## 2012-06-26 DIAGNOSIS — Z6831 Body mass index (BMI) 31.0-31.9, adult: Secondary | ICD-10-CM

## 2012-06-26 DIAGNOSIS — J9600 Acute respiratory failure, unspecified whether with hypoxia or hypercapnia: Secondary | ICD-10-CM

## 2012-06-26 DIAGNOSIS — D759 Disease of blood and blood-forming organs, unspecified: Secondary | ICD-10-CM

## 2012-06-26 DIAGNOSIS — N390 Urinary tract infection, site not specified: Secondary | ICD-10-CM

## 2012-06-26 DIAGNOSIS — D72829 Elevated white blood cell count, unspecified: Secondary | ICD-10-CM

## 2012-06-26 DIAGNOSIS — I824Y9 Acute embolism and thrombosis of unspecified deep veins of unspecified proximal lower extremity: Secondary | ICD-10-CM | POA: Diagnosis present

## 2012-06-26 DIAGNOSIS — F411 Generalized anxiety disorder: Secondary | ICD-10-CM | POA: Diagnosis present

## 2012-06-26 DIAGNOSIS — K219 Gastro-esophageal reflux disease without esophagitis: Secondary | ICD-10-CM

## 2012-06-26 DIAGNOSIS — E785 Hyperlipidemia, unspecified: Secondary | ICD-10-CM

## 2012-06-26 DIAGNOSIS — H6990 Unspecified Eustachian tube disorder, unspecified ear: Secondary | ICD-10-CM

## 2012-06-26 DIAGNOSIS — Z880 Allergy status to penicillin: Secondary | ICD-10-CM

## 2012-06-26 DIAGNOSIS — D649 Anemia, unspecified: Secondary | ICD-10-CM

## 2012-06-26 DIAGNOSIS — N952 Postmenopausal atrophic vaginitis: Secondary | ICD-10-CM

## 2012-06-26 DIAGNOSIS — K5792 Diverticulitis of intestine, part unspecified, without perforation or abscess without bleeding: Secondary | ICD-10-CM

## 2012-06-26 DIAGNOSIS — R109 Unspecified abdominal pain: Secondary | ICD-10-CM

## 2012-06-26 DIAGNOSIS — Z79899 Other long term (current) drug therapy: Secondary | ICD-10-CM

## 2012-06-26 DIAGNOSIS — M1990 Unspecified osteoarthritis, unspecified site: Secondary | ICD-10-CM

## 2012-06-26 DIAGNOSIS — I428 Other cardiomyopathies: Secondary | ICD-10-CM

## 2012-06-26 DIAGNOSIS — M25559 Pain in unspecified hip: Secondary | ICD-10-CM

## 2012-06-26 DIAGNOSIS — J019 Acute sinusitis, unspecified: Secondary | ICD-10-CM

## 2012-06-26 DIAGNOSIS — B370 Candidal stomatitis: Secondary | ICD-10-CM

## 2012-06-26 DIAGNOSIS — I2699 Other pulmonary embolism without acute cor pulmonale: Secondary | ICD-10-CM | POA: Diagnosis present

## 2012-06-26 DIAGNOSIS — A419 Sepsis, unspecified organism: Secondary | ICD-10-CM

## 2012-06-26 DIAGNOSIS — Z888 Allergy status to other drugs, medicaments and biological substances status: Secondary | ICD-10-CM

## 2012-06-26 DIAGNOSIS — F17200 Nicotine dependence, unspecified, uncomplicated: Secondary | ICD-10-CM

## 2012-06-26 LAB — CBC WITH DIFFERENTIAL/PLATELET
Basophils Absolute: 0 10*3/uL (ref 0.0–0.1)
Basophils Relative: 0 % (ref 0–1)
Eosinophils Absolute: 0.1 10*3/uL (ref 0.0–0.7)
HCT: 42.9 % (ref 36.0–46.0)
MCH: 30.1 pg (ref 26.0–34.0)
MCHC: 32.9 g/dL (ref 30.0–36.0)
Monocytes Absolute: 0.9 10*3/uL (ref 0.1–1.0)
Monocytes Relative: 9 % (ref 3–12)
Neutro Abs: 6.9 10*3/uL (ref 1.7–7.7)
RDW: 15 % (ref 11.5–15.5)

## 2012-06-26 LAB — BASIC METABOLIC PANEL
BUN: 31 mg/dL — ABNORMAL HIGH (ref 6–23)
Calcium: 9.9 mg/dL (ref 8.4–10.5)
Chloride: 101 mEq/L (ref 96–112)
Creatinine, Ser: 0.92 mg/dL (ref 0.50–1.10)
GFR calc Af Amer: 70 mL/min — ABNORMAL LOW (ref >90)
GFR calc non Af Amer: 61 mL/min — ABNORMAL LOW (ref >90)

## 2012-06-26 LAB — BASIC METABOLIC PANEL WITH GFR
CO2: 28 mEq/L (ref 19–32)
Calcium: 10.1 mg/dL (ref 8.4–10.5)
Glucose, Bld: 133 mg/dL — ABNORMAL HIGH (ref 70–99)
Potassium: 4.5 mEq/L (ref 3.5–5.3)
Sodium: 141 mEq/L (ref 135–145)

## 2012-06-26 LAB — GLUCOSE, CAPILLARY
Glucose-Capillary: 113 mg/dL — ABNORMAL HIGH (ref 70–99)
Glucose-Capillary: 123 mg/dL — ABNORMAL HIGH (ref 70–99)

## 2012-06-26 LAB — MRSA PCR SCREENING: MRSA by PCR: NEGATIVE

## 2012-06-26 LAB — PROTIME-INR: Prothrombin Time: 12.7 seconds (ref 11.6–15.2)

## 2012-06-26 LAB — ANTITHROMBIN III: AntiThromb III Func: 109 % (ref 75–120)

## 2012-06-26 MED ORDER — ALBUTEROL SULFATE (5 MG/ML) 0.5% IN NEBU: 2.5000 mg | INHALATION_SOLUTION | RESPIRATORY_TRACT | Status: DC | PRN

## 2012-06-26 MED ORDER — FOLIC ACID 1 MG PO TABS
1.0000 mg | ORAL_TABLET | Freq: Every day | ORAL | Status: DC
  Administered 2012-06-27 – 2012-06-30 (×4): 1 mg via ORAL
  Filled 2012-06-26 (×5): qty 1

## 2012-06-26 MED ORDER — HEPARIN (PORCINE) IN NACL 100-0.45 UNIT/ML-% IJ SOLN
1200.0000 [IU]/h | INTRAMUSCULAR | Status: DC
  Administered 2012-06-26: 1200 [IU]/h via INTRAVENOUS
  Filled 2012-06-26 (×2): qty 250

## 2012-06-26 MED ORDER — HYDROXYZINE HCL 10 MG PO TABS
10.0000 mg | ORAL_TABLET | Freq: Four times a day (QID) | ORAL | Status: DC | PRN
  Filled 2012-06-26: qty 1

## 2012-06-26 MED ORDER — PANTOPRAZOLE SODIUM 40 MG PO TBEC
40.0000 mg | DELAYED_RELEASE_TABLET | Freq: Every day | ORAL | Status: DC
  Administered 2012-06-28 – 2012-06-30 (×4): 40 mg via ORAL
  Filled 2012-06-26 (×4): qty 1

## 2012-06-26 MED ORDER — MORPHINE SULFATE 4 MG/ML IJ SOLN: 2.0000 mg | INTRAMUSCULAR | Status: DC | PRN

## 2012-06-26 MED ORDER — IPRATROPIUM BROMIDE 0.02 % IN SOLN: 0.5000 mg | RESPIRATORY_TRACT | Status: DC | PRN

## 2012-06-26 MED ORDER — HEPARIN BOLUS VIA INFUSION
4000.0000 [IU] | Freq: Once | INTRAVENOUS | Status: AC
  Administered 2012-06-26: 4000 [IU] via INTRAVENOUS

## 2012-06-26 MED ORDER — WARFARIN - PHARMACIST DOSING INPATIENT
Freq: Every day | Status: DC
  Administered 2012-06-26 – 2012-06-27 (×2)

## 2012-06-26 MED ORDER — FENOFIBRATE 160 MG PO TABS
160.0000 mg | ORAL_TABLET | Freq: Every day | ORAL | Status: DC
  Administered 2012-06-27 – 2012-06-30 (×4): 160 mg via ORAL
  Filled 2012-06-26 (×5): qty 1

## 2012-06-26 MED ORDER — SODIUM CHLORIDE 0.9 % IV SOLN: 250.0000 mL | INTRAVENOUS | Status: DC | PRN

## 2012-06-26 MED ORDER — ONDANSETRON HCL 4 MG PO TABS: 4.0000 mg | ORAL_TABLET | Freq: Four times a day (QID) | ORAL | Status: DC | PRN

## 2012-06-26 MED ORDER — ADULT MULTIVITAMIN W/MINERALS CH
1.0000 | ORAL_TABLET | Freq: Every day | ORAL | Status: DC
  Administered 2012-06-27 – 2012-06-30 (×4): 1 via ORAL
  Filled 2012-06-26 (×4): qty 1

## 2012-06-26 MED ORDER — VITAMIN D 1000 UNITS PO TABS
2000.0000 [IU] | ORAL_TABLET | Freq: Every day | ORAL | Status: DC
  Administered 2012-06-27 – 2012-06-30 (×4): 2000 [IU] via ORAL
  Filled 2012-06-26 (×4): qty 2

## 2012-06-26 MED ORDER — HYDROCODONE-ACETAMINOPHEN 5-325 MG PO TABS: 1.0000 | ORAL_TABLET | Freq: Four times a day (QID) | ORAL | Status: DC | PRN

## 2012-06-26 MED ORDER — SODIUM CHLORIDE 0.9 % IJ SOLN: 3.0000 mL | INTRAMUSCULAR | Status: DC | PRN

## 2012-06-26 MED ORDER — ACETAMINOPHEN 650 MG RE SUPP: 650.0000 mg | Freq: Four times a day (QID) | RECTAL | Status: DC | PRN

## 2012-06-26 MED ORDER — ONDANSETRON HCL 4 MG/2ML IJ SOLN: 4.0000 mg | Freq: Four times a day (QID) | INTRAMUSCULAR | Status: DC | PRN

## 2012-06-26 MED ORDER — SODIUM CHLORIDE 0.9 % IJ SOLN
3.0000 mL | Freq: Two times a day (BID) | INTRAMUSCULAR | Status: DC
  Administered 2012-06-28 – 2012-06-30 (×5): 3 mL via INTRAVENOUS

## 2012-06-26 MED ORDER — AMLODIPINE BESYLATE 5 MG PO TABS
5.0000 mg | ORAL_TABLET | Freq: Every day | ORAL | Status: DC
  Administered 2012-06-27 – 2012-06-30 (×4): 5 mg via ORAL
  Filled 2012-06-26 (×4): qty 1

## 2012-06-26 MED ORDER — TRAMADOL HCL 50 MG PO TABS
50.0000 mg | ORAL_TABLET | Freq: Four times a day (QID) | ORAL | Status: DC | PRN
  Administered 2012-06-28: 50 mg via ORAL
  Filled 2012-06-26: qty 1

## 2012-06-26 MED ORDER — IOHEXOL 350 MG/ML SOLN
80.0000 mL | Freq: Once | INTRAVENOUS | Status: AC | PRN
  Administered 2012-06-26: 80 mL via INTRAVENOUS

## 2012-06-26 MED ORDER — SIMVASTATIN 20 MG PO TABS
20.0000 mg | ORAL_TABLET | Freq: Every day | ORAL | Status: DC
  Administered 2012-06-26 – 2012-06-29 (×4): 20 mg via ORAL
  Filled 2012-06-26 (×5): qty 1

## 2012-06-26 MED ORDER — ALPRAZOLAM 0.25 MG PO TABS
1.0000 mg | ORAL_TABLET | Freq: Three times a day (TID) | ORAL | Status: DC | PRN
  Administered 2012-06-26 – 2012-06-27 (×4): 1 mg via ORAL
  Administered 2012-06-28: 2 mg via ORAL
  Administered 2012-06-29 (×2): 1 mg via ORAL
  Administered 2012-06-30: 2 mg via ORAL
  Filled 2012-06-26: qty 3
  Filled 2012-06-26: qty 4
  Filled 2012-06-26: qty 8
  Filled 2012-06-26: qty 4
  Filled 2012-06-26: qty 1
  Filled 2012-06-26 (×5): qty 4

## 2012-06-26 MED ORDER — CALCIUM CARBONATE-VITAMIN D 250-125 MG-UNIT PO TABS: 1.0000 | ORAL_TABLET | Freq: Every day | ORAL | Status: DC

## 2012-06-26 MED ORDER — ALPRAZOLAM 0.25 MG PO TABS
1.0000 mg | ORAL_TABLET | Freq: Once | ORAL | Status: AC
  Administered 2012-06-26: 1 mg via ORAL
  Filled 2012-06-26: qty 4

## 2012-06-26 MED ORDER — CALCIUM CARBONATE-VITAMIN D 500-200 MG-UNIT PO TABS
1.0000 | ORAL_TABLET | Freq: Every day | ORAL | Status: DC
  Administered 2012-06-27 – 2012-06-30 (×4): 1 via ORAL
  Filled 2012-06-26 (×5): qty 1

## 2012-06-26 MED ORDER — ACETAMINOPHEN 325 MG PO TABS
650.0000 mg | ORAL_TABLET | Freq: Four times a day (QID) | ORAL | Status: DC | PRN
  Administered 2012-06-27 – 2012-06-29 (×4): 650 mg via ORAL
  Filled 2012-06-26 (×5): qty 2

## 2012-06-26 MED ORDER — BENAZEPRIL HCL 20 MG PO TABS
20.0000 mg | ORAL_TABLET | Freq: Every day | ORAL | Status: DC
  Administered 2012-06-27 – 2012-06-28 (×2): 20 mg via ORAL
  Filled 2012-06-26 (×4): qty 1

## 2012-06-26 MED ORDER — CLORAZEPATE DIPOTASSIUM 3.75 MG PO TABS
7.5000 mg | ORAL_TABLET | Freq: Two times a day (BID) | ORAL | Status: DC
  Administered 2012-06-27 – 2012-06-30 (×7): 7.5 mg via ORAL
  Filled 2012-06-26 (×2): qty 1
  Filled 2012-06-26 (×4): qty 2
  Filled 2012-06-26: qty 1
  Filled 2012-06-26: qty 2
  Filled 2012-06-26: qty 1

## 2012-06-26 MED ORDER — WARFARIN SODIUM 5 MG PO TABS
5.0000 mg | ORAL_TABLET | Freq: Once | ORAL | Status: AC
  Administered 2012-06-26: 5 mg via ORAL
  Filled 2012-06-26: qty 1

## 2012-06-26 NOTE — Progress Notes (Addendum)
ANTICOAGULATION CONSULT NOTE - Initial Consult  Pharmacy Consult for Heparin (Addendum: & Warfarin) Indication: Saddle PE  Allergies  Allergen Reactions  . Amoxicillin   . Amoxicillin-Pot Clavulanate   . Citalopram Hydrobromide     CELEXA  . Clonazepam Itching  . Cymbalta (Duloxetine Hcl)     Nausea headache and diarrhea   . Macrobid (Nitrofurantoin Macrocrystal)     Nausea, vomitting  . Penicillins   . Pyridium (Phenazopyridine Hcl)     headache  . Zonegran   . Zonisamide   . Sertraline Hcl Anxiety  . Wellbutrin (Bupropion) Itching and Rash    Itching, rash, hyper     Patient Measurements: Height: 5\' 3"  (160 cm) Weight: 170 lb (77.111 kg) IBW/kg (Calculated) : 52.4  Heparin Dosing Weight: 69 kg  Vital Signs: Temp: 97.9 F (36.6 C) (10/24 1435) Temp src: Oral (10/24 1435) BP: 113/79 mmHg (10/24 1435) Pulse Rate: 99  (10/24 1435)  Labs:  Basename 06/25/12 1622  HGB --  HCT --  PLT --  APTT --  LABPROT --  INR --  HEPARINUNFRC --  CREATININE 0.96  CKTOTAL --  CKMB --  TROPONINI --    Estimated Creatinine Clearance: 52.1 ml/min (by C-G formula based on Cr of 0.96).   Medical History: Past Medical History  Diagnosis Date  . Hyperlipidemia   . Hypertension   . Hyperglycemia   . GERD (gastroesophageal reflux disease)   . Diabetes mellitus   . Anemia   . Leukocytosis   . Diverticulitis   . Neuropathy   . Anxiety     SEVERE    Assessment: 71 y.o. F who presented to the [facility] after being diagnosed with a saddle PE via CT as an outpatient. Pharmacy was consulted to start heparin for anticoagulation in the setting of a new PE.  The patient was not on any blood thinners prior to admission other than ASA which her husband stated she takes "several times a day". The patient did not receive any treatment medication (i.e. shot of lovenox) as an outpatient prior to arriving at [facility]. She states no recent surgeries, hx CVA, or hx recent bleeding. Heparin  dosing wt~69 kg. Recent blood work ~1 month ago show Hgb/Hcg/Plt wnl -- CBC this admission still pending.  Goal of Therapy:  Heparin level 0.3-0.7 units/ml Monitor platelets by anticoagulation protocol: Yes   Plan:  1. Heparin bolus of 4000 units x 1  2. Initiate heparin drip rate at 1200 units/hr (12 ml/hr) 3. Daily heparin levels, CBC 4. Will continue to monitor for any signs/symptoms of bleeding and will follow up with heparin level in 8 hours   Georgina Pillion, PharmD, BCPS Clinical Pharmacist Pager: [phone] 06/26/2012 3:10 PM    --------------------------------------------------------------  Addendum: Admitting MD also wants to go ahead and initiate warfarin this evening. Today is VTE overlap D#1/5 recommended per CHEST guidelines however best practice is to continue bridging until INR therapeutic x 24 hours. Baseline INR 0.96.  Plan: 1. Warfarin 5 mg x 1 dose at 1800 today 2. Daily PT/INR 3. Warfarin book/video 4. Will plan on educating on warfarin prior to discharge 5. Will continue to monitor for any signs/symptoms of bleeding and will follow up with PT/INR in the a.m.   Georgina Pillion, PharmD, BCPS Clinical Pharmacist Pager: [phone] 06/26/2012 5:44 PM

## 2012-06-26 NOTE — ED Provider Notes (Signed)
MSE was initiated and I personally evaluated the patient and placed orders (if any) on June 26, 2012.  The patient appears stable so that the remainder of the MSE may be completed by another provider.  Jalaila Caradonna B. Bernette Mayers, MD 06/26/12 2224

## 2012-06-26 NOTE — ED Notes (Signed)
Pt. ECG monitor showing atrial fibrillation. Pt. Not symptomatic. When attempting to put in information for EKG capture pt. Returned to [facility]

## 2012-06-26 NOTE — ED Notes (Signed)
Patient reports she had rapid heart rate and sob.  She was seen for outpatient CT and told that she has pe in her lungs.  Patient denies pain

## 2012-06-26 NOTE — H&P (Signed)
PCP:   Neena Rhymes, MD   Chief Complaint:  Palpitations. Left lower extremity swelling x 3-5 weeks.   HPI: This is a 71 year old female, with history of diet-controlled DM-2, neuropathy, liver abscess in 2011, tobacco abuse, dyslipidemia, GERD, HTN, chronic anemia, diverticulosis, anxiety, DJD, s/p Lumbar  Left hip and bilateral knee injections by her orthopedic surgeon, and and a left foot stress fracture about 5 weeks ago, who was referred by Dr Willow Ora, for admission. She was seen in the office on 06/15/12, for palpitations, which per patient an her spouse, has been present for a couple of weeks, with HR of 110-130. On evaluation by Dr Drue Novel, EKG showed sinus tachycardia, no acute changes, not different from previous EKGs, but per Dr Leta Jungling office notes, he did note left lower extremity edema. As the patient had a negative ultrasound for DVT done 05/07/12, he arranged a CTA which was done this morning, and revealed bilateral pulmonary emboli. Stringy saddle embolus is present. Patient denies any recent travel, but has had restricted mobility, due to her multiple orthopedic issues. She denies chest pain or shortness of breath.       Allergies:   Allergies  Allergen Reactions  . Amoxicillin   . Amoxicillin-Pot Clavulanate   . Citalopram Hydrobromide     CELEXA  . Clonazepam Itching  . Cymbalta (Duloxetine Hcl)     Nausea headache and diarrhea   . Macrobid (Nitrofurantoin Macrocrystal)     Nausea, vomitting  . Penicillins   . Pyridium (Phenazopyridine Hcl)     headache  . Zonegran   . Zonisamide   . Sertraline Hcl Anxiety  . Wellbutrin (Bupropion) Itching and Rash    Itching, rash, hyper       Past Medical History  Diagnosis Date  . Hyperlipidemia   . Hypertension   . Hyperglycemia   . GERD (gastroesophageal reflux disease)   . Diabetes mellitus   . Anemia   . Leukocytosis   . Diverticulitis   . Neuropathy   . Anxiety     SEVERE    Past Surgical History    Procedure Date  . Tonsillectomy   . Tympanostomy tube placement     R ear    Prior to Admission medications   Medication Sig Start Date End Date Taking? Authorizing Provider  alprazolam Prudy Feeler) 2 MG tablet Take 0.5-1 tablets (1-2 mg total) by mouth 3 (three) times daily as needed. For anxiety/sleep. 06/16/12  Yes Sheliah Hatch, MD  amLODipine (NORVASC) 5 MG tablet Take 1 tablet (5 mg total) by mouth daily. 02/14/12  Yes Sheliah Hatch, MD  aspirin 325 MG tablet Take 650 mg by mouth every 6 (six) hours as needed. For pain.   Yes Historical Provider, MD  benazepril (LOTENSIN) 20 MG tablet Take 1 tablet (20 mg total) by mouth daily. 02/29/12  Yes Sheliah Hatch, MD  Calcium Carbonate-Vitamin D (CALCIUM + D PO) Take 1 tablet by mouth daily.   Yes Historical Provider, MD  Cholecalciferol (VITAMIN D) 2000 UNITS tablet Take 2,000 Units by mouth daily.   Yes Historical Provider, MD  clorazepate (TRANXENE) 7.5 MG tablet Take 7.5 mg by mouth 2 (two) times daily.    Yes Historical Provider, MD  esomeprazole (NEXIUM) 40 MG capsule Take 40 mg by mouth daily as needed. For acid reflux. 07/09/11  Yes Sheliah Hatch, MD  fenofibrate 160 MG tablet Take 1 tablet (160 mg total) by mouth daily. 01/15/12  Yes Sheliah Hatch,  MD  folic acid (FOLVITE) 1 MG tablet Take 1 tablet (1 mg total) by mouth daily. 05/15/12  Yes Sheliah Hatch, MD  furosemide (LASIX) 20 MG tablet Take 20 mg by mouth daily as needed.   Yes Historical Provider, MD  HYDROcodone-acetaminophen (VICODIN) 5-500 MG per tablet Take 1 tablet by mouth every 6 (six) hours as needed. For pain. 03/17/12  Yes Sheliah Hatch, MD  hydrOXYzine (ATARAX/VISTARIL) 10 MG tablet Take 10 mg by mouth every 6 (six) hours as needed.  03/28/12  Yes Historical Provider, MD  meloxicam (MOBIC) 7.5 MG tablet Take 7.5 mg by mouth daily as needed.   Yes Historical Provider, MD  Multiple Vitamin (MULTIVITAMIN) tablet Take 1 tablet by mouth daily.     Yes  Historical Provider, MD  simvastatin (ZOCOR) 20 MG tablet Take 1 tablet (20 mg total) by mouth at bedtime. 05/15/12  Yes Sheliah Hatch, MD  traMADol (ULTRAM) 50 MG tablet Take 50 mg by mouth every 6 (six) hours as needed. For pain   Yes Historical Provider, MD  trimethoprim (TRIMPEX) 100 MG tablet Take 100 mg by mouth daily.   Yes Historical Provider, MD    Social History: Patient reports that she has been smoking Cigarettes.  She has been smoking about 6-7 cigarettes per day, and has smoked since age 5 years. She has never used smokeless tobacco. She reports that she does not drink alcohol or use illicit drugs.  Family History  Problem Relation Age of Onset  . Diabetes Mother   . Hypertension Mother   . Heart disease Father     MI age 12's   . Diabetes Brother     Review of Systems:  As per HPI and chief complaint. Patent admits to fatigue in the last month or so, denies diminished appetite, weight loss, fever, chills, headache, blurred vision, difficulty in speaking, dysphagia, chest pain, cough, shortness of breath, orthopnea, paroxysmal nocturnal dyspnea, nausea, diaphoresis, abdominal pain, vomiting, diarrhea, belching, heartburn, hematemesis, melena, dysuria, nocturia, urinary frequency, hematochezia, she has had persistent left extremity swelling with some tenderness, for the past few weeks, but denies pain, or redness. The rest of the systems review is negative.  Physical Exam:  General:  Patient does not appear to be in obvious acute distress. Alert, communicative, fully oriented, talking in complete sentences, not short of breath at rest.  HEENT:  No clinical pallor, no jaundice, no conjunctival injection or discharge. Hydration status appears fair.  NECK:  Supple, JVP not seen, no carotid bruits, no palpable lymphadenopathy, no palpable goiter. CHEST:  Clinically clear to auscultation, no wheezes, no crackles. HEART:  Sounds 1 and 2 heard, normal, regular, no  murmurs. ABDOMEN:  Obese, soft, non-tender, no palpable organomegaly, no palpable masses, normal bowel sounds. GENITALIA:  Not examined. LOWER EXTREMITIES:  Increased circumference of left lower leg/calf, compared to right, without tenseness or tenderness, and minimal pitting edema. Palpable peripheral pulses. MUSCULOSKELETAL SYSTEM:  Generalized osteoarthritic changes, otherwise. CENTRAL NERVOUS SYSTEM:  No focal neurologic deficit on gross examination.  Labs on Admission:  Results for orders placed during the hospital encounter of 06/26/12 (from the past 48 hour(s))  CBC WITH DIFFERENTIAL     Status: Normal   Collection Time   06/26/12  2:43 PM      Component Value Range Comment   WBC 10.5  4.0 - 10.5 K/uL    RBC 4.69  3.87 - 5.11 MIL/uL    Hemoglobin 14.1  12.0 - 15.0 g/dL  HCT 42.9  36.0 - 46.0 %    MCV 91.5  78.0 - 100.0 fL    MCH 30.1  26.0 - 34.0 pg    MCHC 32.9  30.0 - 36.0 g/dL    RDW 40.9  81.1 - 91.4 %    Platelets 285  150 - 400 K/uL    Neutrophils Relative 66  43 - 77 %    Neutro Abs 6.9  1.7 - 7.7 K/uL    Lymphocytes Relative 24  12 - 46 %    Lymphs Abs 2.5  0.7 - 4.0 K/uL    Monocytes Relative 9  3 - 12 %    Monocytes Absolute 0.9  0.1 - 1.0 K/uL    Eosinophils Relative 1  0 - 5 %    Eosinophils Absolute 0.1  0.0 - 0.7 K/uL    Basophils Relative 0  0 - 1 %    Basophils Absolute 0.0  0.0 - 0.1 K/uL   BASIC METABOLIC PANEL     Status: Abnormal   Collection Time   06/26/12  2:43 PM      Component Value Range Comment   Sodium 139  135 - 145 mEq/L    Potassium 3.7  3.5 - 5.1 mEq/L    Chloride 101  96 - 112 mEq/L    CO2 29  19 - 32 mEq/L    Glucose, Bld 102 (*) 70 - 99 mg/dL    BUN 31 (*) 6 - 23 mg/dL    Creatinine, Ser 7.82  0.50 - 1.10 mg/dL    Calcium 9.9  8.4 - 95.6 mg/dL    GFR calc non Af Amer 61 (*) >90 mL/min    GFR calc Af Amer 70 (*) >90 mL/min   PROTIME-INR     Status: Normal   Collection Time   06/26/12  2:43 PM      Component Value Range  Comment   Prothrombin Time 12.7  11.6 - 15.2 seconds    INR 0.96  0.00 - 1.49   APTT     Status: Normal   Collection Time   06/26/12  2:43 PM      Component Value Range Comment   aPTT 25  24 - 37 seconds     Radiological Exams on Admission: *RADIOLOGY REPORT*  Clinical Data: 71 year old female left lower extremity swelling.  Palpitations.  CT ANGIOGRAPHY CHEST  Technique: Multidetector CT imaging of the chest using the  standard protocol during bolus administration of intravenous  contrast. Multiplanar reconstructed images including MIPs were  obtained and reviewed to evaluate the vascular anatomy.  Contrast: 80mL OMNIPAQUE IOHEXOL 350 MG/ML SOLN  Comparison: 12/28/2009.  Findings: Good contrast bolus timing in the pulmonary arterial  tree. Stringy saddle embolus low density filling defect in both  main pulmonary arteries. Bilateral mostly lower lobe hilar  pulmonary artery filling defects. There is some anterior right  upper lobe involvement.  No pericardial effusion. Interatrial septal lipomatosis again  noted. No pleural effusion. Small right Bochdalek hernia.  Negative visualized upper abdominal viscera except for some splenic  flexure diverticulosis and small left adrenal benign low density  adenoma.  Calcified atherosclerosis throughout the aorta. Coronary artery  involvement.  Major airways are patent. Dependent atelectasis is superimposed on  central lobular emphysema. No areas of pulmonary consolidation or  definite infarction. Mild lingula and right middle lobe  atelectasis.  No mediastinal lymphadenopathy.  Osteopenia. No acute osseous abnormality identified.  IMPRESSION:  1. Positive for bilateral pulmonary  emboli. Stringy saddle  embolus is present, otherwise proximal lower lobe pulmonary artery  involvement predominates.  2. Mild atelectasis superimposed on emphysema.  3. Atherosclerosis.  Critical Value/emergent results were called by telephone at the   time of interpretation on 06/26/2012 at 1345 hours to Dr. Willow Ora,  who verbally acknowledged these results.  Original Report Authenticated By: Harley Hallmark, M.D.   Assessment/Plan Active Problems:  1. Pulmonary emboli: Patient presented with palpitations and tachycardia, as well as LLE swelling despite negative LLE dolpler on 05/07/12, against a background of poor mobility, from multiple orthopedic issues. CTA has confirmed bilateral PE with a stringy saddle embolus. Patient is devoid of chest pain or dyspnea at this time, and is hemodynamically stable. In view of high clot burden however, will admit to SDU for close observation. IV Heparin has already been started in the ED and start Coumadin. We shall check hypercoagulable profile, although the likely culprit is patient's restricted mobility, and for completeness, do LE venous doppler, to check for DVT.  2. HTN (hypertension): BP is controlled on pre-admission antihypertensives, which we shall continue.  3. Dyslipidemia: On Fenofibrate.  4. Diabetes mellitus: Per patient, this is diet controlled, and random blood glucose is normal at 102. We shall therefore, place patient on carbohydrate-modified diet, and follow CBGs. We shall check HBA1C.  5. GERD (gastroesophageal reflux disease): Stable/asymptomatic. Continue PPI.  6. Anxiety: Not problematic.  7. DJD (degenerative joint disease): Stable. Will need Pt/OT input.  8. Tobacco abuse: Counseled appropriately. Will manage with Nicoderm CQ patch.   Further management will depend on clinical course.   Comment: Patient is FULL CODE.    Time Spent on Admission: 45 mins.   Takuma Cifelli,CHRISTOPHER 06/26/2012, 5:06 PM

## 2012-06-26 NOTE — ED Notes (Signed)
Zocor ordered from pharmacy

## 2012-06-26 NOTE — ED Notes (Signed)
Explained to patient moving to another room in ED for a more comfortable bed until she receives a room for admission. Verbalized understanding.

## 2012-06-26 NOTE — Telephone Encounter (Signed)
CT showed a saddle embolism. Talked with Dr. Brien Few, hospitalist, he recommend the patient to be sent to the ER. I discussed the diagnosis with the patient and her husband, they agree to go to  [facility] ER. I rec an  ambulance since she has a PE, the husband is quite reluctant, states that he can take her and  will be there in 5 to 10 minutes (yesterday she was hemodynamically stable, this is going on x at least few days) I spoke with Morrie Sheldon at the ER triage and the charge nurse, I asked them to see the patient immediately.

## 2012-06-26 NOTE — ED Notes (Signed)
Patient was sent from her physician's office for blood clots in her lungs. She is upset and wants to leave AMA. EDP spoke with patient and her husband and she has agreed to stay for now. She is anxious and short answered.

## 2012-06-26 NOTE — Progress Notes (Signed)
Right:  No evidence of DVT, superficial thrombosis, or Baker's cyst.  Left: DVT noted in the common femoral, femoral, profunda, popliteal, posterior tibial, and peroneal veins.  No evidence of superficial thrombosis.  No Baker's cyst.

## 2012-06-27 ENCOUNTER — Ambulatory Visit: Payer: Medicare Other | Admitting: Family Medicine

## 2012-06-27 ENCOUNTER — Ambulatory Visit: Payer: Self-pay | Admitting: Internal Medicine

## 2012-06-27 DIAGNOSIS — I82409 Acute embolism and thrombosis of unspecified deep veins of unspecified lower extremity: Secondary | ICD-10-CM | POA: Diagnosis present

## 2012-06-27 LAB — PROTEIN C ACTIVITY: Protein C Activity: 200 % — ABNORMAL HIGH (ref 75–133)

## 2012-06-27 LAB — COMPREHENSIVE METABOLIC PANEL
ALT: 19 U/L (ref 0–35)
AST: 19 U/L (ref 0–37)
Albumin: 3.1 g/dL — ABNORMAL LOW (ref 3.5–5.2)
CO2: 27 mEq/L (ref 19–32)
Calcium: 9.2 mg/dL (ref 8.4–10.5)
Chloride: 102 mEq/L (ref 96–112)
GFR calc non Af Amer: 85 mL/min — ABNORMAL LOW (ref >90)
Sodium: 136 mEq/L (ref 135–145)

## 2012-06-27 LAB — HEPARIN LEVEL (UNFRACTIONATED)
Heparin Unfractionated: 1.43 IU/mL — ABNORMAL HIGH (ref 0.30–0.70)
Heparin Unfractionated: 0.68 IU/mL (ref 0.30–0.70)

## 2012-06-27 LAB — CBC
Hemoglobin: 12.4 g/dL (ref 12.0–15.0)
MCH: 29.4 pg (ref 26.0–34.0)
MCHC: 32 g/dL (ref 30.0–36.0)
MCV: 91.7 fL (ref 78.0–100.0)
RBC: 4.22 MIL/uL (ref 3.87–5.11)

## 2012-06-27 LAB — HEMOGLOBIN A1C
Hgb A1c MFr Bld: 6.4 % — ABNORMAL HIGH (ref <5.7)
Mean Plasma Glucose: 137 mg/dL — ABNORMAL HIGH (ref <117)

## 2012-06-27 LAB — PROTEIN S ACTIVITY: Protein S Activity: 128 % (ref 69–129)

## 2012-06-27 LAB — BETA-2-GLYCOPROTEIN I ABS, IGG/M/A
Beta-2-Glycoprotein I IgA: 12 A Units (ref <20)
Beta-2-Glycoprotein I IgM: 7 M Units (ref <20)

## 2012-06-27 LAB — LUPUS ANTICOAGULANT PANEL: Lupus Anticoagulant: NOT DETECTED

## 2012-06-27 LAB — PROTEIN C, TOTAL: Protein C, Total: 111 % (ref 72–160)

## 2012-06-27 MED ORDER — WARFARIN SODIUM 5 MG PO TABS
5.0000 mg | ORAL_TABLET | Freq: Once | ORAL | Status: AC
  Administered 2012-06-27: 5 mg via ORAL
  Filled 2012-06-27: qty 1

## 2012-06-27 MED ORDER — COUMADIN BOOK
Freq: Once | Status: AC
  Administered 2012-06-27: 12:00:00
  Filled 2012-06-27: qty 1

## 2012-06-27 MED ORDER — WARFARIN VIDEO
Freq: Once | Status: AC
  Administered 2012-06-27: 12:00:00

## 2012-06-27 MED ORDER — HEPARIN (PORCINE) IN NACL 100-0.45 UNIT/ML-% IJ SOLN
1000.0000 [IU]/h | INTRAMUSCULAR | Status: DC
  Administered 2012-06-27: 1000 [IU]/h via INTRAVENOUS
  Filled 2012-06-27: qty 250

## 2012-06-27 MED ORDER — HEPARIN (PORCINE) IN NACL 100-0.45 UNIT/ML-% IJ SOLN
900.0000 [IU]/h | INTRAMUSCULAR | Status: DC
  Filled 2012-06-27 (×2): qty 250

## 2012-06-27 NOTE — Progress Notes (Signed)
Chaplain visited patient after receiving a referral from secretary during morning rounds. Patient was awake, alert and responsive. Husband was in the room with patient during Chaplain's visit. Patient appeared to be worried and in pain. Husband was very hopeful for wife's recovery. Chaplain shared words of comfort and encouragement with patient and husband. Chaplain also empathetically listened to husbands concern and patient's worry. Both patient and family member expressed their gratitude for Chaplain's visit and spiritual support. Chaplain will continue to visit an provide spiritual care to patient and family as needed.

## 2012-06-27 NOTE — Progress Notes (Signed)
ANTICOAGULATION CONSULT NOTE - Follow Up Consult  Pharmacy Consult for heparin Indication: Saddle PE  Allergies  Allergen Reactions  . Amoxicillin   . Amoxicillin-Pot Clavulanate   . Citalopram Hydrobromide     CELEXA  . Clonazepam Itching  . Cymbalta (Duloxetine Hcl)     Nausea headache and diarrhea   . Macrobid (Nitrofurantoin Macrocrystal)     Nausea, vomitting  . Penicillins   . Pyridium (Phenazopyridine Hcl)     headache  . Zonegran   . Zonisamide   . Sertraline Hcl Anxiety  . Wellbutrin (Bupropion) Itching and Rash    Itching, rash, hyper     Patient Measurements: Height: 5\' 3"  (160 cm) Weight: 179 lb 0.2 oz (81.2 kg) IBW/kg (Calculated) : 52.4  Heparin Dosing Weight: 69 kg  Vital Signs: Temp: 97.7 F (36.5 C) (10/24 2336) Temp src: Oral (10/24 2336) BP: 111/88 mmHg (10/24 2336) Pulse Rate: 97  (10/24 2336)  Labs:  Basename 06/26/12 2347 06/26/12 1443 06/25/12 1622  HGB -- 14.1 --  HCT -- 42.9 --  PLT -- 285 --  APTT -- 25 --  LABPROT -- 12.7 --  INR -- 0.96 --  HEPARINUNFRC 1.43* -- --  CREATININE -- 0.92 0.96  CKTOTAL -- -- --  CKMB -- -- --  TROPONINI -- -- --    Estimated Creatinine Clearance: 55.8 ml/min (by C-G formula based on Cr of 0.92).   Medications:  Scheduled:    . ALPRAZolam  1 mg Oral Once  . amLODipine  5 mg Oral Daily  . benazepril  20 mg Oral Daily  . calcium-vitamin D  1 tablet Oral Q breakfast  . cholecalciferol  2,000 Units Oral Daily  . clorazepate  7.5 mg Oral BID  . fenofibrate  160 mg Oral Q breakfast  . folic acid  1 mg Oral Daily  . heparin  4,000 Units Intravenous Once  . multivitamin with minerals  1 tablet Oral Daily  . pantoprazole  40 mg Oral Q0600  . simvastatin  20 mg Oral q1800  . sodium chloride  3 mL Intravenous Q12H  . warfarin  5 mg Oral ONCE-1800  . Warfarin - Pharmacist Dosing Inpatient   Does not apply q1800  . DISCONTD: calcium-vitamin D  1 tablet Oral Daily   Infusions:    . heparin      . DISCONTD: heparin 1,200 Units/hr (06/27/12 0000)    Assessment: 71 yo female with saddle PE is currently on supratherapeutic heparin. Heparin level 1.43. Goal of Therapy:  Heparin level 0.3-0.7 units/ml Monitor platelets by anticoagulation protocol: Yes   Plan:  1) Hold heparin x 1 hour, then resume heparin at 1000 units/hr. 2) Check 8 hr heparin level after that.  Torrian Canion, Tsz-Yin 06/27/2012,1:17 AM

## 2012-06-27 NOTE — Progress Notes (Signed)
[facility] PROGRESS NOTE  Sarah Mcguire RUE:[id number] DOB: 1941-03-26 DOA: 06/26/2012 PCP: Neena Rhymes, MD  Assessment/Plan: Active Problems:  Pulmonary emboli  HTN (hypertension)  Dyslipidemia  Diabetes mellitus  GERD (gastroesophageal reflux disease)  Anxiety  DJD (degenerative joint disease)    1. VTE: Patient presented with palpitations and tachycardia, as well as LLE swelling despite negative LLE dolpler on 05/07/12, against a background of poor mobility, from multiple orthopedic issues. CTA confirmed bilateral PE with a stringy saddle embolus. Managing with iv Heparin and oral Coumadin, and patient is asymptomatic today. She remains hemodynamically stable. Hypercoagulable profile is in progress, although the likely culprit is patient's restricted mobility. LE venous doppler confirmed DVT in the common femoral, femoral, profunda, popliteal, posterior tibial, and peroneal veins. We shall allow patient OOB today, and commence PT/OT on 06/28/12. We plan to transition to [facility] Lovenox on 06/28/12.  2. HTN (hypertension): BP is controlled on pre-admission antihypertensives. 3. Dyslipidemia: On Fenofibrate.  4. Diabetes mellitus: Per patient, this is diet controlled, and random blood glucose was normal at 102 on presentation. Managing with carbohydrate-modified diet, CBGs are normal, and HBA1C is 6.4.  5. GERD (gastroesophageal reflux disease): Stable/asymptomatic. On PPI.  6. Anxiety: Not problematic.  7. DJD (degenerative joint disease): Stable. Will need Pt/OT input.  8. Tobacco abuse: Counseled appropriately. Managing with Nicoderm CQ patch.    Code Status: Full Code.  Family Communication: Discussed in great detail, with patient and spouse.  Disposition Plan: To be determined.    Brief narrative: 71 year old female, with history of diet-controlled DM-2, neuropathy, liver abscess in 2011, tobacco abuse, dyslipidemia, GERD, HTN, chronic anemia, diverticulosis, anxiety,  DJD, s/p Lumbar Left hip and bilateral knee injections by her orthopedic surgeon, and and a left foot stress fracture about 5 weeks ago, who was referred by Dr Willow Ora, for admission. She was seen in the office on 06/15/12, for palpitations, which per patient an her spouse, has been present for a couple of weeks, with HR of 110-130. On evaluation by Dr Drue Novel, EKG showed sinus tachycardia, no acute changes, not different from previous EKGs, but per Dr Leta Jungling office notes, he did note left lower extremity edema. As the patient had a negative ultrasound for DVT done 05/07/12, he arranged a CTA which was done this morning, and revealed bilateral pulmonary emboli. Stringy saddle embolus is present. Patient denies any recent travel, but has had restricted mobility, due to her multiple orthopedic issues. She denied chest pain or shortness of breath.    Consultants:  N/A.   Procedures:  CTA.  LE Venous Doppler.  Antibiotics:  N/A  HPI/Subjective: Asymptomatic.   Objective: Vital signs in last 24 hours: Temp:  [97.6 F (36.4 C)-98.4 F (36.9 C)] 98.4 F (36.9 C) (10/25 0853) Pulse Rate:  [88-112] 103  (10/25 0853) Resp:  [18-30] 28  (10/25 0853) BP: (110-135)/(66-100) 123/77 mmHg (10/25 0853) SpO2:  [93 %-98 %] 96 % (10/25 0853) FiO2 (%):  [2 %] 2 % (10/24 2045) Weight:  [77.111 kg (170 lb)-81.2 kg (179 lb 0.2 oz)] 81.2 kg (179 lb 0.2 oz) (10/24 2045) Weight change:  Last BM Date: 06/27/12  Intake/Output from previous day: 10/24 0701 - 10/25 0700 In: 110 [I.V.:110] Out: 550 [Urine:550] Total I/O In: 260 [P.O.:200; I.V.:60] Out: 700 [Urine:700]   Physical Exam: General: Comfortable. Alert, communicative, fully oriented, talking in complete sentences, not short of breath at rest.  HEENT: No clinical pallor, no jaundice, no conjunctival injection or discharge. Hydration status  appears fair.  NECK: Supple, JVP not seen, no carotid bruits, no palpable lymphadenopathy, no palpable goiter.   CHEST: Clinically clear to auscultation, no wheezes, no crackles.  HEART: Sounds 1 and 2 heard, normal, regular, no murmurs.  ABDOMEN: Obese, soft, non-tender, no palpable organomegaly, no palpable masses, normal bowel sounds.  GENITALIA: Not examined.  LOWER EXTREMITIES: Increased circumference of left lower leg/calf, compared to right, without tenseness or tenderness, and minimal pitting edema. Palpable peripheral pulses.  MUSCULOSKELETAL SYSTEM: Generalized osteoarthritic changes, otherwise.  CENTRAL NERVOUS SYSTEM: No focal neurologic deficit on gross examination.  Lab Results:  Basename 06/27/12 0500 06/26/12 1443  WBC 7.8 10.5  HGB 12.4 14.1  HCT 38.7 42.9  PLT 253 285    Basename 06/27/12 0500 06/26/12 1443  NA 136 139  K 3.7 3.7  CL 102 101  CO2 27 29  GLUCOSE 96 102*  BUN 21 31*  CREATININE 0.68 0.92  CALCIUM 9.2 9.9   Recent Results (from the past 240 hour(s))  MRSA PCR SCREENING     Status: Normal   Collection Time   06/26/12  9:01 PM      Component Value Range Status Comment   MRSA by PCR NEGATIVE  NEGATIVE Final      Studies/Results: Ct Angio Chest W/cm &/or Wo Cm  06/26/2012  *RADIOLOGY REPORT*  Clinical Data: 71 year old female left lower extremity swelling. Palpitations.  CT ANGIOGRAPHY CHEST  Technique:  Multidetector CT imaging of the chest using the standard protocol during bolus administration of intravenous contrast. Multiplanar reconstructed images including MIPs were obtained and reviewed to evaluate the vascular anatomy.  Contrast: 80mL OMNIPAQUE IOHEXOL 350 MG/ML SOLN  Comparison: 12/28/2009.  Findings: Good contrast bolus timing in the pulmonary arterial tree.  Stringy saddle embolus low density filling defect in both main pulmonary arteries.  Bilateral mostly lower lobe hilar pulmonary artery filling defects.  There is some anterior right upper lobe involvement.  No pericardial effusion.  Interatrial septal lipomatosis again noted.  No pleural  effusion.  Small right Bochdalek hernia. Negative visualized upper abdominal viscera except for some splenic flexure diverticulosis and small left adrenal benign low density adenoma.  Calcified atherosclerosis throughout the aorta.  Coronary artery involvement.  Major airways are patent.  Dependent atelectasis is superimposed on central lobular emphysema.  No areas of pulmonary consolidation or definite infarction.  Mild lingula and right middle lobe atelectasis.  No mediastinal lymphadenopathy.  Osteopenia. No acute osseous abnormality identified.  IMPRESSION: 1.  Positive for bilateral pulmonary emboli.  Stringy saddle embolus is present, otherwise proximal lower lobe pulmonary artery involvement predominates. 2.  Mild atelectasis superimposed on emphysema. 3.  Atherosclerosis.  Critical Value/emergent results were called by telephone at the time of interpretation on 06/26/2012 at 1345 hours to Dr. Willow Ora, who verbally acknowledged these results.   Original Report Authenticated By: Harley Hallmark, M.D.     Medications: Scheduled Meds:   . ALPRAZolam  1 mg Oral Once  . amLODipine  5 mg Oral Daily  . benazepril  20 mg Oral Daily  . calcium-vitamin D  1 tablet Oral Q breakfast  . cholecalciferol  2,000 Units Oral Daily  . clorazepate  7.5 mg Oral BID  . coumadin book   Does not apply Once  . fenofibrate  160 mg Oral Q breakfast  . folic acid  1 mg Oral Daily  . heparin  4,000 Units Intravenous Once  . multivitamin with minerals  1 tablet Oral Daily  . pantoprazole  40  mg Oral Q0600  . simvastatin  20 mg Oral q1800  . sodium chloride  3 mL Intravenous Q12H  . warfarin  5 mg Oral ONCE-1800  . warfarin   Does not apply Once  . Warfarin - Pharmacist Dosing Inpatient   Does not apply q1800  . DISCONTD: calcium-vitamin D  1 tablet Oral Daily   Continuous Infusions:   . heparin 1,000 Units/hr (06/27/12 1129)  . DISCONTD: heparin 1,200 Units/hr (06/27/12 0000)   PRN Meds:.sodium chloride,  acetaminophen, acetaminophen, albuterol, alprazolam, HYDROcodone-acetaminophen, hydrOXYzine, ipratropium, morphine injection, ondansetron (ZOFRAN) IV, ondansetron, sodium chloride, traMADol    LOS: 1 day   Cigi Bega,CHRISTOPHER  [facility] Pager [phone]. If 8PM-8AM, please contact night-coverage at [email or web address], password [facility] 06/27/2012, 11:47 AM  LOS: 1 day

## 2012-06-27 NOTE — Progress Notes (Signed)
Pharmacy Consult-Anticoagulation  Pharmacy Consult:    71 y/o female with a Saddle PE, LLE DVT who is currently on heparin per protocol.  Current Labs: Hematology  Basename 06/27/12 1919 06/27/12 0959 06/27/12 0500 06/26/12 2347  HGB -- -- 12.4 --  HCT -- -- 38.7 --  PLT -- -- 253 --  HEPARINUNFRC 0.68 0.80* -- 1.43*  CREATININE -- -- 0.68 --   Estimated Creatinine Clearance: 64.1 ml/min (by C-G formula based on Cr of 0.68).  Current Meds:  Heparin infusing at 900 units/hr.  Assessment:  Heparin level is within therapeutic target of 0.3 - 0/7.  No bleeding complications observed.  Goal/Plan:  Heparin goal is 0.3 - 0.7..  Heparin will be continued at 900 units/hr.  Next Heparin level due with AM labs.  Daily Heparin level and CBC while on Heparin.  Fredrick Geoghegan, Elisha Headland, Pharm.D. 06/27/2012  8:39 PM'

## 2012-06-27 NOTE — Progress Notes (Signed)
ANTICOAGULATION CONSULT NOTE - Follow Up Consult  Pharmacy Consult for Heparin Indication: Saddle PE, LLE DVT  Allergies  Allergen Reactions  . Amoxicillin   . Amoxicillin-Pot Clavulanate   . Citalopram Hydrobromide     CELEXA  . Clonazepam Itching  . Cymbalta (Duloxetine Hcl)     Nausea headache and diarrhea   . Macrobid (Nitrofurantoin Macrocrystal)     Nausea, vomitting  . Penicillins   . Pyridium (Phenazopyridine Hcl)     headache  . Zonegran   . Zonisamide   . Sertraline Hcl Anxiety  . Wellbutrin (Bupropion) Itching and Rash    Itching, rash, hyper     Patient Measurements: Heparin Dosing Weight: 69kg  Vital Signs: Temp: 98.4 F (36.9 C) (10/25 0853) Temp src: Oral (10/25 0853) BP: 123/77 mmHg (10/25 0853) Pulse Rate: 76  (10/25 1200)  Labs:  Basename 06/27/12 0959 06/27/12 0500 06/26/12 2347 06/26/12 1443 06/25/12 1622  HGB -- 12.4 -- 14.1 --  HCT -- 38.7 -- 42.9 --  PLT -- 253 -- 285 --  APTT -- -- -- 25 --  LABPROT -- -- -- 12.7 --  INR -- -- -- 0.96 --  HEPARINUNFRC 0.80* -- 1.43* -- --  CREATININE -- 0.68 -- 0.92 0.96  CKTOTAL -- -- -- -- --  CKMB -- -- -- -- --  TROPONINI -- -- -- -- --    Estimated Creatinine Clearance: 64.1 ml/min (by C-G formula based on Cr of 0.68).   Medications:  Heparin @ 1000 units/hr  Assessment: 71yof continues on day #2/5 VTE overlap with heparin and coumadin for bilateral saddle PE and LLE DVT (both diagnosed 10/24). Heparin level continues to be supratherapeutic despite holding and resuming at a lower rate. No INR today - will order daily INR to start 10/26. CBC stable. No bleeding. Noted plan to transition to lovenox tomorrow.  Goal of Therapy:  Heparin level 0.3-0.7 units/ml INR 2-3 Monitor platelets by anticoagulation protocol: Yes   Plan:  1) Decrease heparin to 900 units/hr 2) 6h heparin level 3) Repeat coumadin 5mg  x 1 4) Follow up daily INR, CBC, and coumadin education  Fredrik Rigger 06/27/2012,12:11 PM

## 2012-06-27 NOTE — Care Management Note (Addendum)
    Page 1 of 2   06/30/2012     3:57:07 PM   CARE MANAGEMENT NOTE 06/30/2012  Patient:  Sarah Mcguire, Sarah Mcguire   Account Number:  [id number]  Date Initiated:  06/27/2012  Documentation initiated by:  Sarah Mcguire  Subjective/Objective Assessment:   adm w pul embolus     Action/Plan:   lives w husband, pcp dr Cherrie Distance tabori   Anticipated DC Date:  06/30/2012   Anticipated DC Plan:  HOME W HOME HEALTH SERVICES      DC Planning Services  CM consult  Medication Assistance      [facility] Choice  HOME HEALTH   Choice offered to / List presented to:  C-1 Patient        HH arranged  HH-1 RN  HH-2 PT      HH agency  [facility]   Status of service:  Completed, signed off Medicare Important Message given?   (If response is "NO", the following Medicare IM given date fields will be blank) Date Medicare IM given:   Date Additional Medicare IM given:    Discharge Disposition:  HOME W HOME HEALTH SERVICES  Per UR Regulation:  Reviewed for med. necessity/level of care/duration of stay  If discussed at Long Length of Stay Meetings, dates discussed:    Comments:  06/30/12 Sarah Sestak,RN,BSN [phone] PT FOR DC HOME TODAY ON HOME LOVENOX.  HUSBAND STATES HE CAN GIVE PT INJECTIONS, AS HE HAS DONE IT BEFORE.  PT WILL NEED HHRN FOR PT/INR DRAWS AND PT SAFETY EVAL AT HOME. REFERRAL TO [facility], PER PT CHOICE.  START OF CARE 24-48H POST DC DATE.  CONFIRMED THAT [facility] ON [address] HAS DOSE IN STOCK, PER PT/HUSBAND'S REQUEST, AS [facility] DID NOT HAVE DOSE IN STOCK.  ESTIMATED COST OF COPAY IS 33% OF TOTAL COST OF LOVENOX, OR AROUND $140, AND PT/HUSBAND MADE AWARE.  10/25 9:45a Sarah dowell rn,bns [phone]

## 2012-06-28 ENCOUNTER — Other Ambulatory Visit: Payer: Medicare Other

## 2012-06-28 LAB — CBC
HCT: 38.4 % (ref 36.0–46.0)
Hemoglobin: 12.2 g/dL (ref 12.0–15.0)
MCH: 29.3 pg (ref 26.0–34.0)
MCHC: 31.8 g/dL (ref 30.0–36.0)
MCV: 92.3 fL (ref 78.0–100.0)
Platelets: 276 10*3/uL (ref 150–400)
RBC: 4.16 MIL/uL (ref 3.87–5.11)
RDW: 14.9 % (ref 11.5–15.5)
WBC: 6.4 10*3/uL (ref 4.0–10.5)

## 2012-06-28 LAB — PROTIME-INR
INR: 1.13 (ref 0.00–1.49)
Prothrombin Time: 14.3 seconds (ref 11.6–15.2)

## 2012-06-28 LAB — BASIC METABOLIC PANEL
BUN: 18 mg/dL (ref 6–23)
Calcium: 9.4 mg/dL (ref 8.4–10.5)
GFR calc non Af Amer: 85 mL/min — ABNORMAL LOW (ref >90)
Glucose, Bld: 119 mg/dL — ABNORMAL HIGH (ref 70–99)
Sodium: 144 mEq/L (ref 135–145)

## 2012-06-28 LAB — GLUCOSE, CAPILLARY: Glucose-Capillary: 111 mg/dL — ABNORMAL HIGH (ref 70–99)

## 2012-06-28 MED ORDER — BIOTENE DRY MOUTH MT LIQD
15.0000 mL | Freq: Two times a day (BID) | OROMUCOSAL | Status: DC
  Administered 2012-06-28 – 2012-06-30 (×2): 15 mL via OROMUCOSAL

## 2012-06-28 MED ORDER — ENOXAPARIN SODIUM 80 MG/0.8ML ~~LOC~~ SOLN
80.0000 mg | Freq: Two times a day (BID) | SUBCUTANEOUS | Status: DC
  Filled 2012-06-28 (×2): qty 0.8

## 2012-06-28 MED ORDER — WARFARIN SODIUM 7.5 MG PO TABS
7.5000 mg | ORAL_TABLET | Freq: Once | ORAL | Status: AC
  Administered 2012-06-28: 7.5 mg via ORAL
  Filled 2012-06-28 (×2): qty 1

## 2012-06-28 MED ORDER — ENOXAPARIN SODIUM 80 MG/0.8ML ~~LOC~~ SOLN
80.0000 mg | Freq: Two times a day (BID) | SUBCUTANEOUS | Status: DC
  Administered 2012-06-28 – 2012-06-29 (×3): 80 mg via SUBCUTANEOUS
  Filled 2012-06-28 (×4): qty 0.8

## 2012-06-28 NOTE — Progress Notes (Signed)
[facility] PROGRESS NOTE  Sarah Mcguire HYQ:[id number] DOB: May 12, 1941 DOA: 06/26/2012 PCP: Neena Rhymes, MD  Assessment/Plan: Active Problems:  Pulmonary emboli  HTN (hypertension)  Dyslipidemia  Diabetes mellitus  GERD (gastroesophageal reflux disease)  Anxiety  DJD (degenerative joint disease)  DVT (deep venous thrombosis)    1. VTE: Patient presented with palpitations and tachycardia, as well as LLE swelling despite negative LLE dolpler on 05/07/12, against a background of poor mobility, from multiple orthopedic issues. CTA confirmed bilateral PE with a stringy saddle embolus. Managed initially with iv Heparin and oral Coumadin. Patient has remained asymptomatic and hemodynamically stable since hospitalization. Hypercoagulable profile is unremarkable, so the likely culprit is patient's restricted mobility. LE venous doppler confirmed DVT in the common femoral, femoral, profunda, popliteal, posterior tibial, and peroneal veins. We shall broaden activity level commence PT/OT. She has been transitioned to [facility] Lovenox, effective from 06/28/12.  2. HTN (hypertension): BP is controlled on pre-admission antihypertensives. 3. Dyslipidemia: On Fenofibrate.  4. Diabetes mellitus: Per patient, this is diet controlled, and random blood glucose was normal at 102 on presentation. Managing with carbohydrate-modified diet, CBGs have remained normal, and HBA1C is 6.4.  5. GERD (gastroesophageal reflux disease): Stable/asymptomatic. On PPI.  6. Anxiety: Not problematic.  7. DJD (degenerative joint disease): Stable. Will need Pt/OT input.  8. Tobacco abuse: Counseled appropriately. Managing with Nicoderm CQ patch.    Code Status: Full Code.  Family Communication: Discussed in great detail, with patient and spouse.  Disposition Plan: To be determined. Stable for transfer to telemetry floor today.    Brief narrative: 71 year old female, with history of diet-controlled DM-2, neuropathy,  liver abscess in 2011, tobacco abuse, dyslipidemia, GERD, HTN, chronic anemia, diverticulosis, anxiety, DJD, s/p Lumbar Left hip and bilateral knee injections by her orthopedic surgeon, and and a left foot stress fracture about 5 weeks ago, who was referred by Dr Willow Ora, for admission. She was seen in the office on 06/15/12, for palpitations, which per patient an her spouse, has been present for a couple of weeks, with HR of 110-130. On evaluation by Dr Drue Novel, EKG showed sinus tachycardia, no acute changes, not different from previous EKGs, but per Dr Leta Jungling office notes, he did note left lower extremity edema. As the patient had a negative ultrasound for DVT done 05/07/12, he arranged a CTA which was done this morning, and revealed bilateral pulmonary emboli. Stringy saddle embolus is present. Patient denies any recent travel, but has had restricted mobility, due to her multiple orthopedic issues. She denied chest pain or shortness of breath.    Consultants:  N/A.   Procedures:  CTA.  LE Venous Doppler.  Antibiotics:  N/A  HPI/Subjective: Asymptomatic.   Objective: Vital signs in last 24 hours: Temp:  [97.8 F (36.6 C)-98.2 F (36.8 C)] 98.1 F (36.7 C) (10/26 0730) Pulse Rate:  [73-90] 79  (10/26 0730) Resp:  [16-23] 16  (10/26 0730) BP: (105-126)/(58-78) 115/58 mmHg (10/26 0730) SpO2:  [89 %-96 %] 96 % (10/26 0730) FiO2 (%):  [2 %] 2 % (10/26 0645) Weight change:  Last BM Date: 06/28/12  Intake/Output from previous day: 10/25 0701 - 10/26 0700 In: 1091.2 [P.O.:820; I.V.:271.2] Out: 1230 [Urine:1230] Total I/O In: 19 [I.V.:19] Out: -    Physical Exam: General: Comfortable in chair. Alert, communicative, fully oriented, talking in complete sentences, not short of breath at rest.  HEENT: No clinical pallor, no jaundice, no conjunctival injection or discharge. Hydration status appears fair.  NECK: Supple, JVP not  seen, no carotid bruits, no palpable lymphadenopathy, no  palpable goiter.  CHEST: Clinically clear to auscultation, no wheezes, no crackles.  HEART: Sounds 1 and 2 heard, normal, regular, no murmurs.  ABDOMEN: Obese, soft, non-tender, no palpable organomegaly, no palpable masses, normal bowel sounds.  GENITALIA: Not examined.  LOWER EXTREMITIES: Circumference of left lower leg/calf, has decreased. Palpable peripheral pulses.  MUSCULOSKELETAL SYSTEM: Generalized osteoarthritic changes, otherwise.  CENTRAL NERVOUS SYSTEM: No focal neurologic deficit on gross examination.  Lab Results:  Basename 06/28/12 0445 06/27/12 0500  WBC 6.4 7.8  HGB 12.2 12.4  HCT 38.4 38.7  PLT 276 253    Basename 06/28/12 0445 06/27/12 0500  NA 144 136  K 4.2 3.7  CL 110 102  CO2 30 27  GLUCOSE 119* 96  BUN 18 21  CREATININE 0.69 0.68  CALCIUM 9.4 9.2   Recent Results (from the past 240 hour(s))  MRSA PCR SCREENING     Status: Normal   Collection Time   06/26/12  9:01 PM      Component Value Range Status Comment   MRSA by PCR NEGATIVE  NEGATIVE Final      Studies/Results: Ct Angio Chest W/cm &/or Wo Cm  06/26/2012  *RADIOLOGY REPORT*  Clinical Data: 71 year old female left lower extremity swelling. Palpitations.  CT ANGIOGRAPHY CHEST  Technique:  Multidetector CT imaging of the chest using the standard protocol during bolus administration of intravenous contrast. Multiplanar reconstructed images including MIPs were obtained and reviewed to evaluate the vascular anatomy.  Contrast: 80mL OMNIPAQUE IOHEXOL 350 MG/ML SOLN  Comparison: 12/28/2009.  Findings: Good contrast bolus timing in the pulmonary arterial tree.  Stringy saddle embolus low density filling defect in both main pulmonary arteries.  Bilateral mostly lower lobe hilar pulmonary artery filling defects.  There is some anterior right upper lobe involvement.  No pericardial effusion.  Interatrial septal lipomatosis again noted.  No pleural effusion.  Small right Bochdalek hernia. Negative visualized  upper abdominal viscera except for some splenic flexure diverticulosis and small left adrenal benign low density adenoma.  Calcified atherosclerosis throughout the aorta.  Coronary artery involvement.  Major airways are patent.  Dependent atelectasis is superimposed on central lobular emphysema.  No areas of pulmonary consolidation or definite infarction.  Mild lingula and right middle lobe atelectasis.  No mediastinal lymphadenopathy.  Osteopenia. No acute osseous abnormality identified.  IMPRESSION: 1.  Positive for bilateral pulmonary emboli.  Stringy saddle embolus is present, otherwise proximal lower lobe pulmonary artery involvement predominates. 2.  Mild atelectasis superimposed on emphysema. 3.  Atherosclerosis.  Critical Value/emergent results were called by telephone at the time of interpretation on 06/26/2012 at 1345 hours to Dr. Willow Ora, who verbally acknowledged these results.   Original Report Authenticated By: Harley Hallmark, M.D.     Medications: Scheduled Meds:    . amLODipine  5 mg Oral Daily  . antiseptic oral rinse  15 mL Mouth Rinse BID  . benazepril  20 mg Oral Daily  . calcium-vitamin D  1 tablet Oral Q breakfast  . cholecalciferol  2,000 Units Oral Daily  . clorazepate  7.5 mg Oral BID  . coumadin book   Does not apply Once  . enoxaparin (LOVENOX) injection  80 mg Subcutaneous BID  . fenofibrate  160 mg Oral Q breakfast  . folic acid  1 mg Oral Daily  . multivitamin with minerals  1 tablet Oral Daily  . pantoprazole  40 mg Oral Q0600  . simvastatin  20 mg Oral q1800  .  sodium chloride  3 mL Intravenous Q12H  . warfarin  5 mg Oral ONCE-1800  . warfarin  7.5 mg Oral ONCE-1800  . warfarin   Does not apply Once  . Warfarin - Pharmacist Dosing Inpatient   Does not apply q1800  . DISCONTD: enoxaparin (LOVENOX) injection  80 mg Subcutaneous Q12H   Continuous Infusions:    . DISCONTD: heparin 1,000 Units/hr (06/27/12 1129)  . DISCONTD: heparin 900 Units/hr (06/28/12  0600)   PRN Meds:.sodium chloride, acetaminophen, acetaminophen, albuterol, alprazolam, HYDROcodone-acetaminophen, hydrOXYzine, ipratropium, morphine injection, ondansetron (ZOFRAN) IV, ondansetron, sodium chloride, traMADol    LOS: 2 days   Sarah Mcguire,CHRISTOPHER  [facility] Pager [phone]. If 8PM-8AM, please contact night-coverage at [email or web address], password [facility] 06/28/2012, 11:06 AM  LOS: 2 days

## 2012-06-28 NOTE — Progress Notes (Signed)
ANTICOAGULATION CONSULT NOTE - Follow Up Consult  Pharmacy Consult for Heparin>>Lovenox + Coumadin Indication: Saddle PE, LLE DVT  Allergies  Allergen Reactions  . Amoxicillin   . Amoxicillin-Pot Clavulanate   . Citalopram Hydrobromide     CELEXA  . Clonazepam Itching  . Cymbalta (Duloxetine Hcl)     Nausea headache and diarrhea   . Macrobid (Nitrofurantoin Macrocrystal)     Nausea, vomitting  . Penicillins   . Pyridium (Phenazopyridine Hcl)     headache  . Zonegran   . Zonisamide   . Sertraline Hcl Anxiety  . Wellbutrin (Bupropion) Itching and Rash    Itching, rash, hyper     Patient Measurements: Height: 5\' 3"  (160 cm) Weight: 179 lb 0.2 oz (81.2 kg) IBW/kg (Calculated) : 52.4   Vital Signs: Temp: 98.1 F (36.7 C) (10/26 0730) Temp src: Oral (10/26 0730) BP: 115/58 mmHg (10/26 0730) Pulse Rate: 79  (10/26 0730)  Labs:  Basename 06/28/12 0445 06/27/12 1919 06/27/12 0959 06/27/12 0500 06/26/12 2347 06/26/12 1443  HGB 12.2 -- -- 12.4 -- --  HCT 38.4 -- -- 38.7 -- 42.9  PLT 276 -- -- 253 -- 285  APTT -- -- -- -- -- 25  LABPROT 14.3 -- -- -- -- 12.7  INR 1.13 -- -- -- -- 0.96  HEPARINUNFRC -- 0.68 0.80* -- 1.43* --  CREATININE 0.69 -- -- 0.68 -- 0.92  CKTOTAL -- -- -- -- -- --  CKMB -- -- -- -- -- --  TROPONINI -- -- -- -- -- --    Estimated Creatinine Clearance: 64.1 ml/min (by C-G formula based on Cr of 0.69).  Assessment: 71yof continues on day #3/5 VTE overlap for bilateral saddle PE and LLE DVT (both diagnosed 10/24). She is to transition from heparin to lovenox today. Renal function good for 1mg /kg q12 dosing. INR remains subtherapeutic and barely moving after 2 doses of coumadin so will increase dose today. CBC stable. No bleeding noted.  Goal of Therapy:  INR 2-3 Anti Xa 0.6-1.2 Monitor platelets by anticoagulation protocol: Yes   Plan:  1) Stop heparin gtt 2) Lovenox 80mg  sq q12 3) Coumadin 7.5mg  x 1 4) Follow up INR, CBC, coumadin  education  Fredrik Rigger 06/28/2012,9:22 AM

## 2012-06-29 LAB — PROTIME-INR: Prothrombin Time: 16.1 seconds — ABNORMAL HIGH (ref 11.6–15.2)

## 2012-06-29 LAB — CBC
Hemoglobin: 12.3 g/dL (ref 12.0–15.0)
MCV: 92.3 fL (ref 78.0–100.0)
Platelets: 271 10*3/uL (ref 150–400)
RBC: 4.15 MIL/uL (ref 3.87–5.11)
WBC: 6.6 10*3/uL (ref 4.0–10.5)

## 2012-06-29 LAB — GLUCOSE, CAPILLARY
Glucose-Capillary: 107 mg/dL — ABNORMAL HIGH (ref 70–99)
Glucose-Capillary: 103 mg/dL — ABNORMAL HIGH (ref 70–99)

## 2012-06-29 MED ORDER — ENOXAPARIN SODIUM 120 MG/0.8ML ~~LOC~~ SOLN
120.0000 mg | SUBCUTANEOUS | Status: DC
  Administered 2012-06-29: 120 mg via SUBCUTANEOUS
  Filled 2012-06-29 (×2): qty 0.8

## 2012-06-29 MED ORDER — ENOXAPARIN SODIUM 120 MG/0.8ML ~~LOC~~ SOLN
1.5000 mg/kg | SUBCUTANEOUS | Status: DC
  Filled 2012-06-29: qty 1.6

## 2012-06-29 MED ORDER — WARFARIN - PHARMACIST DOSING INPATIENT: Freq: Every day | Status: DC

## 2012-06-29 MED ORDER — ENOXAPARIN (LOVENOX) PATIENT EDUCATION KIT
PACK | Freq: Once | Status: DC
  Filled 2012-06-29: qty 1

## 2012-06-29 MED ORDER — WARFARIN SODIUM 7.5 MG PO TABS
7.5000 mg | ORAL_TABLET | Freq: Once | ORAL | Status: AC
  Administered 2012-06-29: 7.5 mg via ORAL
  Filled 2012-06-29: qty 1

## 2012-06-29 NOTE — Progress Notes (Signed)
[facility] PROGRESS NOTE  KASHVI PREVETTE WUJ:[id number] DOB: 09/07/1940 DOA: 06/26/2012 PCP: Neena Rhymes, MD  Assessment/Plan: Active Problems:  Pulmonary emboli  HTN (hypertension)  Dyslipidemia  Diabetes mellitus  GERD (gastroesophageal reflux disease)  Anxiety  DJD (degenerative joint disease)  DVT (deep venous thrombosis)    1. VTE: Patient presented with palpitations and tachycardia, as well as LLE swelling despite negative LLE dolpler on 05/07/12, against a background of poor mobility, from multiple orthopedic issues. CTA confirmed bilateral PE with a stringy saddle embolus. Managed initially with iv Heparin and oral Coumadin. Patient has remained asymptomatic and hemodynamically stable since hospitalization. Hypercoagulable profile is unremarkable, so the likely culprit is patient's restricted mobility. LE venous doppler confirmed DVT in the common femoral, femoral, profunda, popliteal, posterior tibial, and peroneal veins. Patient is doing very well clinically. She has been transitioned to [facility] Lovenox, effective from 06/28/12, and remains asymptomatic. Will likely aim discharge on 06/30/12, if arrangements can be put in place for Lovenox administration, and close INR monitoring. 2. HTN (hypertension): BP is controlled on pre-admission antihypertensives. 3. Dyslipidemia: On Fenofibrate.  4. Diabetes mellitus: Per patient, this is diet controlled, and random blood glucose was normal at 102 on presentation. Managing with carbohydrate-modified diet, CBGs have remained normal, and HBA1C is 6.4.  5. GERD (gastroesophageal reflux disease): Stable/asymptomatic. On PPI.  6. Anxiety: Not problematic.  7. DJD (degenerative joint disease): Stable. Will need Pt/OT input.  8. Tobacco abuse: Counseled appropriately. Managing with Nicoderm CQ patch.    Code Status: Full Code.  Family Communication: Discussed in great detail, with patient and spouse.  Disposition Plan: Aiming possible  discharge on 06/30/12. .    Brief narrative: 71 year old female, with history of diet-controlled DM-2, neuropathy, liver abscess in 2011, tobacco abuse, dyslipidemia, GERD, HTN, chronic anemia, diverticulosis, anxiety, DJD, s/p Lumbar Left hip and bilateral knee injections by her orthopedic surgeon, and and a left foot stress fracture about 5 weeks ago, who was referred by Dr Willow Ora, for admission. She was seen in the office on 06/15/12, for palpitations, which per patient an her spouse, has been present for a couple of weeks, with HR of 110-130. On evaluation by Dr Drue Novel, EKG showed sinus tachycardia, no acute changes, not different from previous EKGs, but per Dr Leta Jungling office notes, he did note left lower extremity edema. As the patient had a negative ultrasound for DVT done 05/07/12, he arranged a CTA which was done this morning, and revealed bilateral pulmonary emboli. Stringy saddle embolus is present. Patient denies any recent travel, but has had restricted mobility, due to her multiple orthopedic issues. She denied chest pain or shortness of breath.    Consultants:  N/A.   Procedures:  CTA.  LE Venous Doppler.  Antibiotics:  N/A  HPI/Subjective: Asymptomatic.   Objective: Vital signs in last 24 hours: Temp:  [97.3 F (36.3 C)-98 F (36.7 C)] 97.3 F (36.3 C) (10/27 0458) Pulse Rate:  [75-112] 82  (10/27 0944) Resp:  [16-18] 16  (10/27 0458) BP: (103-122)/(65-82) 107/65 mmHg (10/27 0944) SpO2:  [91 %-92 %] 92 % (10/27 0458) Weight change:  Last BM Date: 06/29/12  Intake/Output from previous day: 10/26 0701 - 10/27 0700 In: 1137 [P.O.:1080; I.V.:57] Out: -  Total I/O In: 240 [P.O.:240] Out: -    Physical Exam: General: Comfortable. Alert, communicative, fully oriented, talking in complete sentences, not short of breath at rest.  HEENT: No clinical pallor, no jaundice, no conjunctival injection or discharge. Hydration status appears  fair.  NECK: Supple, JVP not seen,  no carotid bruits, no palpable lymphadenopathy, no palpable goiter.  CHEST: Clinically clear to auscultation, no wheezes, no crackles.  HEART: Sounds 1 and 2 heard, normal, regular, no murmurs.  ABDOMEN: Obese, soft, non-tender, no palpable organomegaly, no palpable masses, normal bowel sounds.  GENITALIA: Not examined.  LOWER EXTREMITIES: Circumference of left lower leg/calf, has markedly decreased. Palpable peripheral pulses.  MUSCULOSKELETAL SYSTEM: Generalized osteoarthritic changes, otherwise.  CENTRAL NERVOUS SYSTEM: No focal neurologic deficit on gross examination.  Lab Results:  Basename 06/29/12 0725 06/28/12 0445  WBC 6.6 6.4  HGB 12.3 12.2  HCT 38.3 38.4  PLT 271 276    Basename 06/28/12 0445 06/27/12 0500  NA 144 136  K 4.2 3.7  CL 110 102  CO2 30 27  GLUCOSE 119* 96  BUN 18 21  CREATININE 0.69 0.68  CALCIUM 9.4 9.2   Recent Results (from the past 240 hour(s))  MRSA PCR SCREENING     Status: Normal   Collection Time   06/26/12  9:01 PM      Component Value Range Status Comment   MRSA by PCR NEGATIVE  NEGATIVE Final      Studies/Results: No results found.  Medications: Scheduled Meds:    . amLODipine  5 mg Oral Daily  . antiseptic oral rinse  15 mL Mouth Rinse BID  . benazepril  20 mg Oral Daily  . calcium-vitamin D  1 tablet Oral Q breakfast  . cholecalciferol  2,000 Units Oral Daily  . clorazepate  7.5 mg Oral BID  . enoxaparin (LOVENOX) injection  80 mg Subcutaneous BID  . fenofibrate  160 mg Oral Q breakfast  . folic acid  1 mg Oral Daily  . multivitamin with minerals  1 tablet Oral Daily  . pantoprazole  40 mg Oral Q0600  . simvastatin  20 mg Oral q1800  . sodium chloride  3 mL Intravenous Q12H  . warfarin  7.5 mg Oral ONCE-1800  . warfarin  7.5 mg Oral Once  . Warfarin - Pharmacist Dosing Inpatient   Does not apply q1800   Continuous Infusions:   PRN Meds:.sodium chloride, acetaminophen, acetaminophen, albuterol, alprazolam,  HYDROcodone-acetaminophen, hydrOXYzine, ipratropium, morphine injection, ondansetron (ZOFRAN) IV, ondansetron, sodium chloride, traMADol    LOS: 3 days   Itzelle Gains,CHRISTOPHER  [facility] Pager [phone]. If 8PM-8AM, please contact night-coverage at [email or web address], password [facility] 06/29/2012, 11:53 AM  LOS: 3 days

## 2012-06-29 NOTE — Progress Notes (Signed)
Pt O2 sat 85% initially while sleeping. Pt was sat up and told to take deep breaths. Pt O2 sat came up to 92% on RA, no complaints, will continue to monitor.

## 2012-06-29 NOTE — Discharge Summary (Addendum)
Physician Discharge Summary  Sarah Mcguire ZOX:[id number] DOB: Jun 21, 1941 DOA: 06/26/2012  PCP: Neena Rhymes, MD  Admit date: 06/26/2012 Discharge date: 06/30/2012  Time spent: 40 minutes  Recommendations for Outpatient Follow-up:  1. Follow up with primary MD. 2. HHPT/RN  Discharge Diagnoses:  Active Problems:  Pulmonary emboli  HTN (hypertension)  Dyslipidemia  Diabetes mellitus  GERD (gastroesophageal reflux disease)  Anxiety  DJD (degenerative joint disease)  DVT (deep venous thrombosis)   Discharge Condition: Satisfactory.  Diet recommendation: Heart-Healthy/Carbohydrate-Modified.   Filed Weights   06/26/12 1437 06/26/12 1501 06/26/12 2045  Weight: 77.111 kg (170 lb) 77.111 kg (170 lb) 81.2 kg (179 lb 0.2 oz)    History of present illness:  71 year old female, with history of diet-controlled DM-2, neuropathy, liver abscess in 2011, tobacco abuse, dyslipidemia, GERD, HTN, chronic anemia, diverticulosis, anxiety, DJD, s/p Lumbar Left hip and bilateral knee injections by her orthopedic surgeon, and and a left foot stress fracture about 5 weeks ago, who was referred by Dr Willow Ora, for admission. She was seen in the office on 06/15/12, for palpitations, which per patient an her spouse, has been present for a couple of weeks, with HR of 110-130. On evaluation by Dr Drue Novel, EKG showed sinus tachycardia, no acute changes, not different from previous EKGs, but per Dr Leta Jungling office notes, he did note left lower extremity edema. As the patient had a negative ultrasound for DVT done 05/07/12, he arranged a CTA which was done this morning, and revealed bilateral pulmonary emboli. Stringy saddle embolus is present. Patient denies any recent travel, but has had restricted mobility, due to her multiple orthopedic issues. She denied chest pain or shortness of breath.    Hospital Course:  1. VTE: Patient presented with palpitations and tachycardia, as well as LLE swelling despite negative  LLE doppler on 05/07/12, against a background of poor mobility from multiple orthopedic issues. CTA confirmed bilateral PE with a stringy saddle embolus. Subsequent LE venous doppler confirmed DVT in the common femoral, femoral, profunda, popliteal, posterior tibial, and peroneal veins. Managed initially with iv Heparin and oral Coumadin, she was transitioned to [facility] Lovenox, effective from 06/28/12. Patient remained asymptomatic and hemodynamically stable throughout this hospitalization. Hypercoagulable profile was unremarkable, so the likely culprit is patient's restricted mobility. She will need HHRN for Lovenox administration, and close INR monitoring. Arrangements have been put in place. INR was 1.65 on 06/30/12. Patient has completed the mandatory 5 days of overlap therapy, and Lovenox will be discontinued by primary MD, after 48 hours of therapeutic INR.  2. HTN (hypertension): BP remained controlled on pre-admission antihypertensives, during hospitalization.  3. Dyslipidemia: On Fenofibrate, which was continued.  4. Diabetes mellitus: Per patient, this is diet controlled, and random blood glucose was normal at 102 on presentation. Managed with carbohydrate-modified diet, CBGs remained normal, and HBA1C was 6.4.  5. GERD (gastroesophageal reflux disease): Stable/asymptomatic on PPI.  6. Anxiety: Not problematic.  7. DJD (degenerative joint disease): Stable. Patient was evaluated by PT/OT, and HHPT/24-hour supervision/assistance, recommended.   8. Tobacco abuse: Counseled appropriately. Managed with Nicoderm CQ patch.    Procedures:  See below.   Consultations:  N/A.   Discharge Exam: Filed Vitals:   06/30/12 0515 06/30/12 0858 06/30/12 0905 06/30/12 1033  BP: 111/79   107/74  Pulse: 82 85 110 75  Temp: 98.5 F (36.9 C)     TempSrc: Oral     Resp: 18     Height:      Weight:  SpO2: 90% 95% 89%     General: Comfortable. Alert, communicative, fully oriented, talking in complete  sentences, not short of breath at rest.  HEENT: No clinical pallor, no jaundice, no conjunctival injection or discharge. Hydration status appears fair.  NECK: Supple, JVP not seen, no carotid bruits, no palpable lymphadenopathy, no palpable goiter.  CHEST: Clinically clear to auscultation, no wheezes, no crackles.  HEART: Sounds 1 and 2 heard, normal, regular, no murmurs.  ABDOMEN: Obese, soft, non-tender, no palpable organomegaly, no palpable masses, normal bowel sounds.  GENITALIA: Not examined.  LOWER EXTREMITIES: Circumference of left lower leg/calf, has markedly decreased. Palpable peripheral pulses.  MUSCULOSKELETAL SYSTEM: Generalized osteoarthritic changes, otherwise.  CENTRAL NERVOUS SYSTEM: No focal neurologic deficit on gross examination.  Discharge Instructions      Discharge Orders    Future Appointments: Provider: Department: Dept Phone: Center:   07/09/2012 8:00 AM Sheliah Hatch, MD [facility] [phone] [facility]     Future Orders Please Complete By Expires   Diet - low sodium heart healthy      Diet Carb Modified      Increase activity slowly          Medication List     As of 06/30/2012  2:20 PM    STOP taking these medications         aspirin 325 MG tablet      meloxicam 7.5 MG tablet   Commonly known as: MOBIC      trimethoprim 100 MG tablet   Commonly known as: TRIMPEX      TAKE these medications         acetaminophen 325 MG tablet   Commonly known as: TYLENOL   Take 2 tablets (650 mg total) by mouth every 6 (six) hours as needed (or Fever >/= 101).      alprazolam 2 MG tablet   Commonly known as: XANAX   Take 0.5-1 tablets (1-2 mg total) by mouth 3 (three) times daily as needed. For anxiety/sleep.      amLODipine 5 MG tablet   Commonly known as: NORVASC   Take 1 tablet (5 mg total) by mouth daily.      benazepril 20 MG tablet   Commonly known as: LOTENSIN   Take 1 tablet (20 mg total) by mouth daily.      CALCIUM + D PO    Take 1 tablet by mouth daily.      clorazepate 7.5 MG tablet   Commonly known as: TRANXENE   Take 7.5 mg by mouth 2 (two) times daily.      enoxaparin 120 MG/0.8ML injection   Commonly known as: LOVENOX   Inject 0.8 mLs (120 mg total) into the skin daily.      esomeprazole 40 MG capsule   Commonly known as: NEXIUM   Take 40 mg by mouth daily as needed. For acid reflux.      fenofibrate 160 MG tablet   Take 1 tablet (160 mg total) by mouth daily.      folic acid 1 MG tablet   Commonly known as: FOLVITE   Take 1 tablet (1 mg total) by mouth daily.      furosemide 20 MG tablet   Commonly known as: LASIX   Take 20 mg by mouth daily as needed.      HYDROcodone-acetaminophen 5-500 MG per tablet   Commonly known as: VICODIN   Take 1 tablet by mouth every 6 (six) hours as needed. For pain.  hydrOXYzine 10 MG tablet   Commonly known as: ATARAX/VISTARIL   Take 10 mg by mouth every 6 (six) hours as needed.      multivitamin tablet   Take 1 tablet by mouth daily.      simvastatin 20 MG tablet   Commonly known as: ZOCOR   Take 1 tablet (20 mg total) by mouth at bedtime.      traMADol 50 MG tablet   Commonly known as: ULTRAM   Take 50 mg by mouth every 6 (six) hours as needed. For pain      Vitamin D 2000 UNITS tablet   Take 2,000 Units by mouth daily.      warfarin 7.5 MG tablet   Commonly known as: COUMADIN   Take 1 tablet (7.5 mg total) by mouth daily.         Follow-up Information    Schedule an appointment as soon as possible for a visit with Neena Rhymes, MD.   Contact information:   [phone] [address] [place] [address] [phone]           The results of significant diagnostics from this hospitalization (including imaging, microbiology, ancillary and laboratory) are listed below for reference.    Significant Diagnostic Studies: Ct Angio Chest W/cm &/or Wo Cm  06/26/2012  *RADIOLOGY REPORT*  Clinical Data: 71 year old female left lower  extremity swelling. Palpitations.  CT ANGIOGRAPHY CHEST  Technique:  Multidetector CT imaging of the chest using the standard protocol during bolus administration of intravenous contrast. Multiplanar reconstructed images including MIPs were obtained and reviewed to evaluate the vascular anatomy.  Contrast: 80mL OMNIPAQUE IOHEXOL 350 MG/ML SOLN  Comparison: 12/28/2009.  Findings: Good contrast bolus timing in the pulmonary arterial tree.  Stringy saddle embolus low density filling defect in both main pulmonary arteries.  Bilateral mostly lower lobe hilar pulmonary artery filling defects.  There is some anterior right upper lobe involvement.  No pericardial effusion.  Interatrial septal lipomatosis again noted.  No pleural effusion.  Small right Bochdalek hernia. Negative visualized upper abdominal viscera except for some splenic flexure diverticulosis and small left adrenal benign low density adenoma.  Calcified atherosclerosis throughout the aorta.  Coronary artery involvement.  Major airways are patent.  Dependent atelectasis is superimposed on central lobular emphysema.  No areas of pulmonary consolidation or definite infarction.  Mild lingula and right middle lobe atelectasis.  No mediastinal lymphadenopathy.  Osteopenia. No acute osseous abnormality identified.  IMPRESSION: 1.  Positive for bilateral pulmonary emboli.  Stringy saddle embolus is present, otherwise proximal lower lobe pulmonary artery involvement predominates. 2.  Mild atelectasis superimposed on emphysema. 3.  Atherosclerosis.  Critical Value/emergent results were called by telephone at the time of interpretation on 06/26/2012 at 1345 hours to Dr. Willow Ora, who verbally acknowledged these results.   Original Report Authenticated By: Harley Hallmark, M.D.     Microbiology: Recent Results (from the past 240 hour(s))  MRSA PCR SCREENING     Status: Normal   Collection Time   06/26/12  9:01 PM      Component Value Range Status Comment   MRSA  by PCR NEGATIVE  NEGATIVE Final      Labs: Basic Metabolic Panel:  Lab 06/30/12 6295 06/28/12 0445 06/27/12 0500 06/26/12 1443 06/25/12 1622  NA 139 144 136 139 141  K 3.9 4.2 3.7 3.7 4.5  CL 104 110 102 101 104  CO2 30 30 27 29 28   GLUCOSE 90 119* 96 102* 133*  BUN 21  18 21 31* 30*  CREATININE 0.79 0.69 0.68 0.92 0.96  CALCIUM 9.7 9.4 9.2 9.9 10.1  MG -- -- -- -- --  PHOS -- -- -- -- --   Liver Function Tests:  Lab 06/27/12 0500  AST 19  ALT 19  ALKPHOS 43  BILITOT 0.2*  PROT 5.8*  ALBUMIN 3.1*   No results found for this basename: LIPASE:5,AMYLASE:5 in the last 168 hours No results found for this basename: AMMONIA:5 in the last 168 hours CBC:  Lab 06/30/12 0550 06/29/12 0725 06/28/12 0445 06/27/12 0500 06/26/12 1443  WBC 6.2 6.6 6.4 7.8 10.5  NEUTROABS -- -- -- -- 6.9  HGB 12.7 12.3 12.2 12.4 14.1  HCT 39.3 38.3 38.4 38.7 42.9  MCV 91.6 92.3 92.3 91.7 91.5  PLT 269 271 276 253 285   Cardiac Enzymes: No results found for this basename: CKTOTAL:5,CKMB:5,CKMBINDEX:5,TROPONINI:5 in the last 168 hours BNP: BNP (last 3 results)  Basename 02/13/12 0915  PROBNP 187.9*   CBG:  Lab 06/30/12 0551 06/29/12 2125 06/29/12 0623 06/28/12 2045 06/28/12 0737  GLUCAP 96 103* 107* 111* 92       Signed:  Jonathon Tan,CHRISTOPHER  [facility] 06/30/2012, 2:20 PM

## 2012-06-29 NOTE — Progress Notes (Signed)
Pt ambulated 250 feet in hallway with husband and rolling walker; pt back in chair; call bell w/i reach; will cont. To monitor.

## 2012-06-29 NOTE — Evaluation (Signed)
Physical Therapy Evaluation Patient Details Name: Sarah Mcguire MRN: [id number] DOB: August 28, 1941 Today's Date: 06/29/2012 Time: 0828-0903 PT Time Calculation (min): 35 min  PT Assessment / Plan / Recommendation Clinical Impression  Patient is a 71 yo female admitted with bil. pulmonary emboli, and DVT LLE.  Patient with LE weakness and decreased safety judgement impacting functional mobility.  Patient will benefit from acute PT to maximize independence prior to discharge home with husband.  Recommend HHPT for continued therapy.    PT Assessment  Patient needs continued PT services    Follow Up Recommendations  Home health PT;Supervision/Assistance - 24 hour    Does the patient have the potential to tolerate intense rehabilitation      Barriers to Discharge None      Equipment Recommendations  None recommended by PT    Recommendations for Other Services     Frequency Min 3X/week    Precautions / Restrictions Precautions Precautions: Fall Restrictions Weight Bearing Restrictions: No   Pertinent Vitals/Pain       Mobility  Bed Mobility Bed Mobility: Not assessed Transfers Transfers: Sit to Stand;Stand to Sit Sit to Stand: 4: Min guard;With upper extremity assist;With armrests;From chair/3-in-1 Stand to Sit: 4: Min guard;With upper extremity assist;With armrests;To chair/3-in-1 Details for Transfer Assistance: Verbal cues for hand placement and safety Ambulation/Gait Ambulation/Gait Assistance: 4: Min guard Ambulation Distance (Feet): 200 Feet Assistive device: Rolling walker Ambulation/Gait Assistance Details: Verbal cues for safe use of RW.  Cues to stand upright and stay close to RW.  Instructions to sidestep through narrow space with RW. Gait Pattern: Step-through pattern;Decreased stride length;Decreased stance time - left;Trunk flexed Gait velocity: Slow gait speed Stairs: No       Exercises General Exercises - Lower Extremity Ankle Circles/Pumps:  AROM;Both;10 reps;Seated Long Arc Quad: AROM;Both;10 reps;Seated Hip ABduction/ADduction: AROM;Both;10 reps;Standing Hip Flexion/Marching: AROM;Both;10 reps;Standing Mini-Sqauts: AROM;Both;10 reps;Standing Other Exercises Other Exercises: Mini-squats with right foot forward, 10 reps Other Exercises: Mini-squats with left foot forward; 10 reps   PT Diagnosis: Difficulty walking;Abnormality of gait;Generalized weakness;Altered mental status  PT Problem List: Decreased strength;Decreased activity tolerance;Decreased balance;Decreased mobility;Decreased cognition;Decreased safety awareness;Decreased knowledge of use of DME PT Treatment Interventions: DME instruction;Gait training;Functional mobility training;Balance training;Cognitive remediation;Patient/family education   PT Goals Acute Rehab PT Goals PT Goal Formulation: With patient/family Time For Goal Achievement: 07/06/12 Potential to Achieve Goals: Good Pt will go Sit to Stand: with modified independence;with upper extremity assist PT Goal: Sit to Stand - Progress: Goal set today Pt will go Stand to Sit: with modified independence;with upper extremity assist PT Goal: Stand to Sit - Progress: Goal set today Pt will Ambulate: >150 feet;with modified independence;with rolling walker PT Goal: Ambulate - Progress: Goal set today  Visit Information  Last PT Received On: 06/29/12 Assistance Needed: +1    Subjective Data  Subjective: "I'm ready to get home."  Husband speaking for wife.  Wife looking to husband to answer questions. Patient Stated Goal: To go home soon   Prior Functioning  Home Living Lives With: Spouse Available Help at Discharge: Family;Available 24 hours/day Type of Home: House Home Access: Stairs to enter [employer] of Steps: 1 Entrance Stairs-Rails: None Home Layout: One level Bathroom Shower/Tub: Engineer, manufacturing systems: Handicapped height Bathroom Accessibility: Yes How Accessible:  Accessible via walker Home Adaptive Equipment: Bedside commode/3-in-1;Quad cane;Walker - rolling;Shower chair with back Theme park manager) Prior Function Level of Independence: Independent with assistive device(s);Needs assistance Needs Assistance: Meal Prep;Light Housekeeping;Bathing Bath: Minimal Meal Prep: Total Light Housekeeping: Moderate  Able to Take Stairs?: No Driving: Yes (minimal) Vocation: Retired Musician: No difficulties    Cognition  Overall Cognitive Status: Impaired Area of Impairment: Safety/judgement;Problem solving Arousal/Alertness: Awake/alert Orientation Level: Oriented X4 / Intact Behavior During Session: Anxious Safety/Judgement: Decreased safety judgement for tasks assessed Safety/Judgement - Other Comments: Patient attempting to stand before RW in place.  Patient attempting to lift RW over chair to get through narrow space. Problem Solving: Difficulty finding safe solution to get through narrow space with RW.  Patient attempting to lift RW over chair.  Needed cues to turn sideways and sidestep through space. Cognition - Other Comments: Patient looking to husband to answer questions regarding home and use of RW/walking boot for fracture.    Extremity/Trunk Assessment Right Upper Extremity Assessment RUE ROM/Strength/Tone: Within functional levels RUE Sensation: WFL - Light Touch Left Upper Extremity Assessment LUE ROM/Strength/Tone: Within functional levels LUE Sensation: WFL - Light Touch Right Lower Extremity Assessment RLE ROM/Strength/Tone: Deficits RLE ROM/Strength/Tone Deficits: Strength grossly 4/5 RLE Sensation: History of peripheral neuropathy Left Lower Extremity Assessment LLE ROM/Strength/Tone: Deficits LLE ROM/Strength/Tone Deficits: Strength grossly 4-/5 LLE Sensation: History of peripheral neuropathy Trunk Assessment Trunk Assessment: Normal   Balance Balance Balance Assessed: Yes High Level Balance High Level  Balance Activites: Side stepping;Backward walking;Turns;Sudden stops;Head turns High Level Balance Comments: Patient with slight decrease in balance with walking backward, turns, and head turns.  Able to self-correct with use of RW.  End of Session PT - End of Session Equipment Utilized During Treatment: Gait belt Activity Tolerance: Patient tolerated treatment well Patient left: in chair;with call bell/phone within reach;with family/visitor present Nurse Communication: Mobility status (Encouraged ambulation in hallway with nursing)  GP     Vena Austria 06/29/2012, 12:32 PM Durenda Hurt. Renaldo Fiddler, [facility] Acute Rehab Services Pager [phone]

## 2012-06-29 NOTE — Progress Notes (Addendum)
ANTICOAGULATION CONSULT NOTE - Follow Up Consult  Pharmacy Consult for Heparin>>Lovenox + Coumadin Indication: Saddle PE, LLE DVT  Allergies  Allergen Reactions  . Amoxicillin   . Amoxicillin-Pot Clavulanate   . Citalopram Hydrobromide     CELEXA  . Clonazepam Itching  . Cymbalta (Duloxetine Hcl)     Nausea headache and diarrhea   . Macrobid (Nitrofurantoin Macrocrystal)     Nausea, vomitting  . Penicillins   . Pyridium (Phenazopyridine Hcl)     headache  . Zonegran   . Zonisamide   . Sertraline Hcl Anxiety  . Wellbutrin (Bupropion) Itching and Rash    Itching, rash, hyper     Patient Measurements: Height: 5\' 3"  (160 cm) Weight: 179 lb 0.2 oz (81.2 kg) IBW/kg (Calculated) : 52.4   Vital Signs: Temp: 97.3 F (36.3 C) (10/27 0458) Temp src: Oral (10/27 0458) BP: 107/65 mmHg (10/27 0944) Pulse Rate: 82  (10/27 0944)  Labs:  Basename 06/29/12 0725 06/28/12 0445 06/27/12 1919 06/27/12 0959 06/27/12 0500 06/26/12 2347 06/26/12 1443  HGB 12.3 12.2 -- -- -- -- --  HCT 38.3 38.4 -- -- 38.7 -- --  PLT 271 276 -- -- 253 -- --  APTT -- -- -- -- -- -- 25  LABPROT 16.1* 14.3 -- -- -- -- 12.7  INR 1.32 1.13 -- -- -- -- 0.96  HEPARINUNFRC -- -- 0.68 0.80* -- 1.43* --  CREATININE -- 0.69 -- -- 0.68 -- 0.92  CKTOTAL -- -- -- -- -- -- --  CKMB -- -- -- -- -- -- --  TROPONINI -- -- -- -- -- -- --    Estimated Creatinine Clearance: 64.1 ml/min (by C-G formula based on Cr of 0.69).  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 She transitioned from heparin to lovenox Saturday 10/26. INR 1.32 after coumadin 5mg  - 5mg  - 7.5 mg. INR < 2-3 goal.  CBC stable. No bleeding noted. Pt and husband extensively counseled about coumadin 06/28/12.  Goal of Therapy:  INR 2-3 Anti Xa 0.6-1.2 Monitor platelets by anticoagulation protocol: Yes   Plan:  1) continue Lovenox 80mg  sq q12 2) repeat Coumadin 7.5mg  x  1 3) daily INR 4) continue LMWH until INR > 2 for 24 hours Herby Abraham, Pharm.D. [phone] 06/29/2012 11:43 AM Addendum: Requested to change to once daily dosing in anticipation of discharge 10/28 Will change to 1.5 mg/kg/day = 120 mg to start tonight at 10 pm Will order lovenox DC kit  Herby Abraham, Pharm.D. [phone] 06/29/2012 12:16 PM

## 2012-06-30 ENCOUNTER — Telehealth: Payer: Self-pay | Admitting: *Deleted

## 2012-06-30 DIAGNOSIS — I2699 Other pulmonary embolism without acute cor pulmonale: Secondary | ICD-10-CM

## 2012-06-30 LAB — BASIC METABOLIC PANEL
GFR calc Af Amer: >90 mL/min (ref >90)
GFR calc non Af Amer: 81 mL/min — ABNORMAL LOW (ref >90)
Potassium: 3.9 mEq/L (ref 3.5–5.1)
Sodium: 139 mEq/L (ref 135–145)

## 2012-06-30 LAB — CBC
MCHC: 32.3 g/dL (ref 30.0–36.0)
RDW: 14.8 % (ref 11.5–15.5)

## 2012-06-30 LAB — PROTIME-INR: Prothrombin Time: 18.7 seconds — ABNORMAL HIGH (ref 11.6–15.2)

## 2012-06-30 LAB — GLUCOSE, CAPILLARY: Glucose-Capillary: 96 mg/dL (ref 70–99)

## 2012-06-30 MED ORDER — ACETAMINOPHEN 325 MG PO TABS: 650.0000 mg | ORAL_TABLET | Freq: Four times a day (QID) | ORAL | Status: DC | PRN

## 2012-06-30 MED ORDER — WARFARIN SODIUM 7.5 MG PO TABS: 7.5000 mg | ORAL_TABLET | Freq: Every day | ORAL | Status: DC

## 2012-06-30 MED ORDER — WARFARIN SODIUM 7.5 MG PO TABS
7.5000 mg | ORAL_TABLET | Freq: Once | ORAL | Status: DC
  Filled 2012-06-30: qty 1

## 2012-06-30 MED ORDER — ENOXAPARIN SODIUM 120 MG/0.8ML ~~LOC~~ SOLN: 120.0000 mg | SUBCUTANEOUS | Status: DC

## 2012-06-30 NOTE — Progress Notes (Signed)
Pt states she is going home today.  Spoke at length with pt and husband.  Pt states she is fairly I with her basic adls and does not need or want HHOT so after this OT screen, therapist has decided to cancel OT eval.  Pt has 24 hour assist at home and is in agreement with this plan. Tory Emerald, [place] [phone]

## 2012-06-30 NOTE — Telephone Encounter (Signed)
Placed referral in chart per MD Tabori instructions: to start asap   Please set up pt for coumadin clinic at [facility]. She got heparin and lovenox in hospital- now on oral meds but not yet therapeutic. Getting Lovenox overlap. Needs to establish w/ them ASAP   Referral rep notified and will call [facility] to set up for pt and notify pt about upcoming apt

## 2012-06-30 NOTE — Progress Notes (Signed)
Physical Therapy Treatment Patient Details Name: Sarah Mcguire MRN: [id number] DOB: [birth year] Today's Date: 06/30/2012 Time: 7829-5621 PT Time Calculation (min): 24 min  PT Assessment / Plan / Recommendation Comments on Treatment Session  Pt adm with PE, DVT.  Pt doing very well with mobility and okay for dc home from PT standpoint.    Follow Up Recommendations  Home health PT     Does the patient have the potential to tolerate intense rehabilitation     Barriers to Discharge        Equipment Recommendations  None recommended by PT    Recommendations for Other Services    Frequency Min 3X/week   Plan Discharge plan remains appropriate;Frequency remains appropriate    Precautions / Restrictions Precautions Precautions: Fall   Pertinent Vitals/Pain SaO2 89% on RA after amb.  Returned to 91% after 30 sec.    Mobility  Transfers Sit to Stand: 6: Modified independent (Device/Increase time);With upper extremity assist;With armrests;From chair/3-in-1 Stand to Sit: 6: Modified independent (Device/Increase time);With upper extremity assist;To chair/3-in-1;With armrests Details for Transfer Assistance: No cues needed. Ambulation/Gait Ambulation/Gait Assistance: 5: Supervision Ambulation Distance (Feet): 225 Feet Assistive device: Rolling walker Ambulation/Gait Assistance Details: No cues needed. Gait Pattern: Step-through pattern;Decreased stride length    Exercises General Exercises - Lower Extremity Ankle Circles/Pumps: AROM;10 reps;Seated Long Arc Quad: Strengthening;Both;10 reps;Seated Hip Flexion/Marching: Strengthening;Both;10 reps;Seated   PT Diagnosis:    PT Problem List:   PT Treatment Interventions:     PT Goals Acute Rehab PT Goals PT Goal: Sit to Stand - Progress: Met PT Goal: Stand to Sit - Progress: Met PT Goal: Ambulate - Progress: Progressing toward goal  Visit Information  Last PT Received On: 06/30/12 Assistance Needed: +1    Subjective Data  Subjective: Pt states she is eager to go home.   Cognition  Overall Cognitive Status: Appears within functional limits for tasks assessed/performed Arousal/Alertness: Awake/alert Orientation Level: Appears intact for tasks assessed Behavior During Session: [facility] for tasks performed    Balance  Static Standing Balance Static Standing - Balance Support: No upper extremity supported;During functional activity Static Standing - Level of Assistance: 6: Modified independent (Device/Increase time) Static Standing - Comment/# of Minutes: 5 minutes for brushing teeth and combing hair.  End of Session PT - End of Session Activity Tolerance: Patient tolerated treatment well Patient left: in chair;with call bell/phone within reach;with family/visitor present Nurse Communication: Mobility status   GP     [facility] 06/30/2012, 9:27 AM  [facility] [phone]

## 2012-07-01 ENCOUNTER — Telehealth: Payer: Self-pay

## 2012-07-01 ENCOUNTER — Ambulatory Visit (INDEPENDENT_AMBULATORY_CARE_PROVIDER_SITE_OTHER): Payer: Medicare Other | Admitting: General Practice

## 2012-07-01 DIAGNOSIS — I2699 Other pulmonary embolism without acute cor pulmonale: Secondary | ICD-10-CM

## 2012-07-01 NOTE — Telephone Encounter (Signed)
Noted  

## 2012-07-01 NOTE — Telephone Encounter (Signed)
[id number] call and fax received from [facility] lab nurse Dillon Bjork lab partners noting pt INR 1.71 today and PT of 19.6, was advised by Arline Asp that they are calling the pt to inform of medication dosage due to today INR of 1.71 and that the pt does have an apt with [facility] tomorrow 07-01-12, pt apt is noted in the chart at this time as pt accepted, also requested a discharge order to be signed from MD Tabori to be sent to [facility], advised Bailey Mech that a request for the discharge form has already been addressed to [facility] rep and has not been received at this time, however will be signed and faxed back in asap. Nurse Leavy Cella understood

## 2012-07-01 NOTE — Telephone Encounter (Signed)
Pt husband called requesting to speak with Dr. Beverely Low regarding changes to the pts coumadin care. I spoke w/ Dr. Beverely Low who advised that the pts coumadin checks will be handled by Bailey Mech at [facility] starting tomorrow. The pt is not eligible for home health care and today will be the last day of home care. I advised pt's husband of above and he voiced understanding.

## 2012-07-02 ENCOUNTER — Other Ambulatory Visit: Payer: Self-pay | Admitting: General Practice

## 2012-07-02 ENCOUNTER — Ambulatory Visit (INDEPENDENT_AMBULATORY_CARE_PROVIDER_SITE_OTHER): Payer: Medicare Other | Admitting: General Practice

## 2012-07-02 DIAGNOSIS — I82409 Acute embolism and thrombosis of unspecified deep veins of unspecified lower extremity: Secondary | ICD-10-CM

## 2012-07-02 DIAGNOSIS — I2699 Other pulmonary embolism without acute cor pulmonale: Secondary | ICD-10-CM

## 2012-07-02 MED ORDER — WARFARIN SODIUM 5 MG PO TABS: 5.0000 mg | ORAL_TABLET | Freq: Every day | ORAL | Status: DC

## 2012-07-08 ENCOUNTER — Ambulatory Visit: Payer: Medicare Other | Admitting: Family Medicine

## 2012-07-09 ENCOUNTER — Ambulatory Visit (INDEPENDENT_AMBULATORY_CARE_PROVIDER_SITE_OTHER): Payer: Medicare Other | Admitting: General Practice

## 2012-07-09 ENCOUNTER — Encounter: Payer: Self-pay | Admitting: Family Medicine

## 2012-07-09 ENCOUNTER — Other Ambulatory Visit: Payer: Self-pay | Admitting: General Practice

## 2012-07-09 ENCOUNTER — Ambulatory Visit (INDEPENDENT_AMBULATORY_CARE_PROVIDER_SITE_OTHER): Payer: Medicare Other | Admitting: Family Medicine

## 2012-07-09 VITALS — BP 104/74 | HR 60 | Temp 98.4°F | Resp 16 | Ht 63.0 in | Wt 181.2 lb

## 2012-07-09 DIAGNOSIS — E785 Hyperlipidemia, unspecified: Secondary | ICD-10-CM

## 2012-07-09 DIAGNOSIS — Z23 Encounter for immunization: Secondary | ICD-10-CM

## 2012-07-09 DIAGNOSIS — E119 Type 2 diabetes mellitus without complications: Secondary | ICD-10-CM

## 2012-07-09 DIAGNOSIS — I2699 Other pulmonary embolism without acute cor pulmonale: Secondary | ICD-10-CM

## 2012-07-09 DIAGNOSIS — Z0000 Encounter for general adult medical examination without abnormal findings: Secondary | ICD-10-CM

## 2012-07-09 DIAGNOSIS — I10 Essential (primary) hypertension: Secondary | ICD-10-CM

## 2012-07-09 DIAGNOSIS — I82409 Acute embolism and thrombosis of unspecified deep veins of unspecified lower extremity: Secondary | ICD-10-CM

## 2012-07-09 LAB — HEPATIC FUNCTION PANEL
ALT: 28 U/L (ref 0–35)
AST: 22 U/L (ref 0–37)
Alkaline Phosphatase: 45 U/L (ref 39–117)
Bilirubin, Direct: 0 mg/dL (ref 0.0–0.3)
Total Bilirubin: 0.2 mg/dL — ABNORMAL LOW (ref 0.3–1.2)
Total Protein: 6.8 g/dL (ref 6.0–8.3)

## 2012-07-09 LAB — POCT INR: INR: 3.6

## 2012-07-09 LAB — LIPID PANEL
Cholesterol: 167 mg/dL (ref 0–200)
LDL Cholesterol: 92 mg/dL (ref 0–99)
Total CHOL/HDL Ratio: 3
VLDL: 23.6 mg/dL (ref 0.0–40.0)

## 2012-07-09 LAB — HEMOGLOBIN A1C: Hgb A1c MFr Bld: 7.1 % — ABNORMAL HIGH (ref 4.6–6.5)

## 2012-07-09 NOTE — Progress Notes (Signed)
  Subjective:    Patient ID: Sarah Mcguire, female    DOB: 1941/02/22, 71 y.o.   MRN: [id number]  HPI Here today for CPE.  Risk Factors: HTN- chronic problem, BP excellent today.  On Norvasc, Benazepril. Hyperlipidemia- chronic problem, on Zocor daily. DM- chronic problem, diet controlled.  Sugars will fluctuate w/ steroid injxns. PE- recent dx, on coumadin.  Following w/ coumadin clinic. Physical Activity: limited due to recent leg fx Fall Risk: elevated due to leg fx and use of walker Depression: no current depression, hx of severe anxiety- seeing psych Hearing: normal to whispered voice and conversational tones ADL's: independent Cognitive: normal linear thought process, memory and attention intact Home Safety: feels safe at home, lives w/ husband Height, Weight, BMI, Visual Acuity: see vitals, vision corrected to 20/20 w/ glasses Counseling: UTD on pap, colonoscopy, DEXA, pt refusing mammo Labs Ordered: See A&P Care Plan: See A&P    Review of Systems Patient reports no vision/ hearing changes, adenopathy,fever, weight change,  persistant/recurrent hoarseness , swallowing issues, chest pain, persistant/recurrent cough, hemoptysis, dyspnea (rest/exertional/paroxysmal nocturnal), gastrointestinal bleeding (melena, rectal bleeding), abdominal pain, significant heartburn, bowel changes, GU symptoms (dysuria, hematuria, incontinence), Gyn symptoms (abnormal  bleeding, pain),  syncope, focal weakness, memory loss, numbness & tingling, skin/hair/nail changes, abnormal bruising or bleeding, or depression.   + palpitations and edema w/ recent PE    Objective:   Physical Exam General Appearance:    Alert, cooperative, no distress, appears stated age  Head:    Normocephalic, without obvious abnormality, atraumatic  Eyes:    PERRL, conjunctiva/corneas clear, EOM's intact, fundi    benign, both eyes  Ears:    Normal TM's and external ear canals, both ears  Nose:   Nares normal, septum  midline, mucosa normal, no drainage    or sinus tenderness  Throat:   Lips, mucosa, and tongue normal; teeth and gums normal  Neck:   Supple, symmetrical, trachea midline, no adenopathy;    Thyroid: no enlargement/tenderness/nodules  Back:     Symmetric, no curvature, ROM normal, no CVA tenderness  Lungs:     Clear to auscultation bilaterally, respirations unlabored  Chest Wall:    No tenderness or deformity   Heart:    Regular rate and rhythm, S1 and S2 normal, no murmur, rub   or gallop  Breast Exam:    Deferred at pt's request  Abdomen:     Soft, non-tender, bowel sounds active all four quadrants,    no masses, no organomegaly  Genitalia:    Deferred at pt's request  Rectal:    Extremities:   Extremities normal, atraumatic, no cyanosis.  + edema  Pulses:   2+ and symmetric all extremities  Skin:   Skin color, texture, turgor normal, no rashes or lesions  Lymph nodes:   Cervical, supraclavicular, and axillary nodes normal  Neurologic:   CNII-XII intact, normal strength, sensation and reflexes    throughout          Assessment & Plan:

## 2012-07-09 NOTE — Patient Instructions (Addendum)
Follow up in 3 months to recheck sugars We'll notify you of your labs and make any changes if needed I'm so glad you're feeling better Call with any questions or concerns Hang in there!!

## 2012-07-14 ENCOUNTER — Ambulatory Visit: Payer: Medicare Other | Admitting: Family Medicine

## 2012-07-16 ENCOUNTER — Telehealth: Payer: Self-pay | Admitting: Family Medicine

## 2012-07-16 ENCOUNTER — Ambulatory Visit (INDEPENDENT_AMBULATORY_CARE_PROVIDER_SITE_OTHER): Payer: Medicare Other | Admitting: General Practice

## 2012-07-16 DIAGNOSIS — Z7901 Long term (current) use of anticoagulants: Secondary | ICD-10-CM

## 2012-07-16 DIAGNOSIS — I82409 Acute embolism and thrombosis of unspecified deep veins of unspecified lower extremity: Secondary | ICD-10-CM

## 2012-07-16 DIAGNOSIS — I2699 Other pulmonary embolism without acute cor pulmonale: Secondary | ICD-10-CM

## 2012-07-16 LAB — POCT INR: INR: 2.2

## 2012-07-16 MED ORDER — FENOFIBRATE 160 MG PO TABS: 160.0000 mg | ORAL_TABLET | Freq: Every day | ORAL | Status: DC

## 2012-07-16 NOTE — Telephone Encounter (Signed)
Refill: Fenofibrate 160 mg tab. Take one tablet by mouth daily. Qty 90. Last fill 04-16-12

## 2012-07-16 NOTE — Telephone Encounter (Signed)
Rx sent.    MW 

## 2012-07-20 NOTE — Assessment & Plan Note (Signed)
Chronic problem, well controlled.  Asymptomatic.  No changes. 

## 2012-07-20 NOTE — Assessment & Plan Note (Signed)
Chronic problem, tolerating statin w/out difficulty.  Check labs.  Adjust meds prn  

## 2012-07-20 NOTE — Assessment & Plan Note (Addendum)
Chronic problem, controlling w/ diet.  Has been unable to exercise due to stress fracture and now PE.  Sugars tend to fluctuate w/ steroid injxns.  Check labs.  start meds prn

## 2012-07-20 NOTE — Assessment & Plan Note (Signed)
New to provider.  Due to DVT from immobility due to recent LE stress fx.  Set up w/ coumadin clinic.  Will follow and assist as able.

## 2012-07-20 NOTE — Assessment & Plan Note (Signed)
Pt's PE WNL w/ exception of LE edema due to DVT.  UTD on health maintenance w/ exception of mammo which pt refuses.  Check labs.  Anticipatory guidance provided.

## 2012-07-22 DIAGNOSIS — E119 Type 2 diabetes mellitus without complications: Secondary | ICD-10-CM

## 2012-07-22 DIAGNOSIS — I10 Essential (primary) hypertension: Secondary | ICD-10-CM

## 2012-07-22 DIAGNOSIS — I2699 Other pulmonary embolism without acute cor pulmonale: Secondary | ICD-10-CM

## 2012-07-22 DIAGNOSIS — I82409 Acute embolism and thrombosis of unspecified deep veins of unspecified lower extremity: Secondary | ICD-10-CM

## 2012-07-23 ENCOUNTER — Ambulatory Visit (INDEPENDENT_AMBULATORY_CARE_PROVIDER_SITE_OTHER): Payer: Medicare Other | Admitting: General Practice

## 2012-07-23 DIAGNOSIS — I82409 Acute embolism and thrombosis of unspecified deep veins of unspecified lower extremity: Secondary | ICD-10-CM

## 2012-07-23 DIAGNOSIS — Z7901 Long term (current) use of anticoagulants: Secondary | ICD-10-CM

## 2012-07-23 DIAGNOSIS — I2699 Other pulmonary embolism without acute cor pulmonale: Secondary | ICD-10-CM

## 2012-07-30 ENCOUNTER — Ambulatory Visit (INDEPENDENT_AMBULATORY_CARE_PROVIDER_SITE_OTHER): Payer: Medicare Other | Admitting: General Practice

## 2012-07-30 DIAGNOSIS — Z7901 Long term (current) use of anticoagulants: Secondary | ICD-10-CM

## 2012-07-30 DIAGNOSIS — I2699 Other pulmonary embolism without acute cor pulmonale: Secondary | ICD-10-CM

## 2012-07-30 DIAGNOSIS — I82409 Acute embolism and thrombosis of unspecified deep veins of unspecified lower extremity: Secondary | ICD-10-CM

## 2012-08-13 ENCOUNTER — Ambulatory Visit (INDEPENDENT_AMBULATORY_CARE_PROVIDER_SITE_OTHER): Payer: Medicare Other | Admitting: General Practice

## 2012-08-13 DIAGNOSIS — I2699 Other pulmonary embolism without acute cor pulmonale: Secondary | ICD-10-CM

## 2012-08-13 DIAGNOSIS — Z7901 Long term (current) use of anticoagulants: Secondary | ICD-10-CM

## 2012-08-13 DIAGNOSIS — I82409 Acute embolism and thrombosis of unspecified deep veins of unspecified lower extremity: Secondary | ICD-10-CM

## 2012-08-13 LAB — POCT INR: INR: 2.4

## 2012-08-18 ENCOUNTER — Telehealth: Payer: Self-pay | Admitting: Family Medicine

## 2012-08-18 ENCOUNTER — Encounter: Payer: Self-pay | Admitting: Family Medicine

## 2012-08-18 DIAGNOSIS — E785 Hyperlipidemia, unspecified: Secondary | ICD-10-CM

## 2012-08-18 MED ORDER — SIMVASTATIN 20 MG PO TABS: 20.0000 mg | ORAL_TABLET | Freq: Every day | ORAL | Status: DC

## 2012-08-18 NOTE — Telephone Encounter (Signed)
refill Simvastatin (Tab) 20 MG Take 1 tablet (20 mg total) by mouth at bedtime. #90 last fill 9.12.13, last ov wt/labs 11.6.13 V70.9

## 2012-08-18 NOTE — Telephone Encounter (Signed)
Refill for simvastatin sent to pharmacy, #90 no rf

## 2012-08-19 ENCOUNTER — Encounter: Payer: Self-pay | Admitting: Lab

## 2012-08-20 ENCOUNTER — Telehealth: Payer: Self-pay | Admitting: General Practice

## 2012-08-20 ENCOUNTER — Encounter: Payer: Self-pay | Admitting: Family Medicine

## 2012-08-20 ENCOUNTER — Ambulatory Visit (INDEPENDENT_AMBULATORY_CARE_PROVIDER_SITE_OTHER): Payer: Medicare Other | Admitting: Family Medicine

## 2012-08-20 VITALS — BP 100/70 | HR 127 | Temp 98.2°F | Ht 63.0 in | Wt 185.2 lb

## 2012-08-20 DIAGNOSIS — J019 Acute sinusitis, unspecified: Secondary | ICD-10-CM

## 2012-08-20 MED ORDER — AZITHROMYCIN 250 MG PO TABS: ORAL_TABLET | ORAL | Status: DC

## 2012-08-20 NOTE — Assessment & Plan Note (Signed)
Pt's sxs and PE consistent w/ early infxn.  Start zpack.  Notified coumadin clinic of abx use and they will determine when to recall pt for INR check.  Reviewed supportive care and red flags that should prompt return.  Pt expressed understanding and is in agreement w/ plan.

## 2012-08-20 NOTE — Telephone Encounter (Signed)
Spoke with patient and instructed her to take 1/2 of coumadin dosage starting today 12/18 thru Sunday 12/22.  Pt's INR will be checked in coumadin clinic on Monday 12/23. Patient verbalized understanding.

## 2012-08-20 NOTE — Progress Notes (Signed)
  Subjective:    Patient ID: Sarah Mcguire, female    DOB: Jun 03, 1941, 71 y.o.   MRN: [id number]  HPI URI- sxs started Friday w/ sneezing.  Will cough w/ lying down.  + nasal congestion, PND.  + sinus pain/pressure.  No fevers.  + sick contacts.  Bilateral ear fullness.  No tooth pain.   Review of Systems For ROS see HPI     Objective:   Physical Exam  Vitals reviewed. Constitutional: She appears well-developed and well-nourished. No distress.  HENT:  Head: Normocephalic and atraumatic.  Right Ear: Tympanic membrane normal.  Left Ear: Tympanic membrane normal.  Nose: Mucosal edema and rhinorrhea present. Right sinus exhibits maxillary sinus tenderness. Right sinus exhibits no frontal sinus tenderness. Left sinus exhibits maxillary sinus tenderness. Left sinus exhibits no frontal sinus tenderness.  Mouth/Throat: Uvula is midline and mucous membranes are normal. Posterior oropharyngeal erythema present. No oropharyngeal exudate.  Eyes: Conjunctivae normal and EOM are normal. Pupils are equal, round, and reactive to light.  Neck: Normal range of motion. Neck supple.  Cardiovascular: Normal rate, regular rhythm and normal heart sounds.   Pulmonary/Chest: Effort normal and breath sounds normal. No respiratory distress. She has no wheezes.  Lymphadenopathy:    She has no cervical adenopathy.          Assessment & Plan:

## 2012-08-20 NOTE — Patient Instructions (Addendum)
This is an early sinus infection Start the Zpack as directed Drink plenty of fluids Continue the Coricidin for congestion REST! Hang in there! Happy Holidays!!

## 2012-08-25 ENCOUNTER — Ambulatory Visit (INDEPENDENT_AMBULATORY_CARE_PROVIDER_SITE_OTHER): Payer: Medicare Other | Admitting: General Practice

## 2012-08-25 DIAGNOSIS — I2699 Other pulmonary embolism without acute cor pulmonale: Secondary | ICD-10-CM

## 2012-08-25 DIAGNOSIS — Z7901 Long term (current) use of anticoagulants: Secondary | ICD-10-CM

## 2012-08-25 DIAGNOSIS — I82409 Acute embolism and thrombosis of unspecified deep veins of unspecified lower extremity: Secondary | ICD-10-CM

## 2012-08-26 ENCOUNTER — Telehealth: Payer: Self-pay | Admitting: Family Medicine

## 2012-08-26 MED ORDER — AMLODIPINE BESYLATE 5 MG PO TABS: 5.0000 mg | ORAL_TABLET | Freq: Every day | ORAL | Status: DC

## 2012-08-26 NOTE — Telephone Encounter (Signed)
Amlodipine 5mg  tab. Take one tablet(5mg  total) by mouth daily. Qty 90 last fill 9.26.13

## 2012-08-26 NOTE — Telephone Encounter (Signed)
Rx sent to pharmacy by e-script.//AB/CMA 

## 2012-09-01 ENCOUNTER — Encounter: Payer: Self-pay | Admitting: Lab

## 2012-09-02 ENCOUNTER — Encounter: Payer: Self-pay | Admitting: Family Medicine

## 2012-09-02 ENCOUNTER — Ambulatory Visit (INDEPENDENT_AMBULATORY_CARE_PROVIDER_SITE_OTHER): Payer: Medicare Other | Admitting: Family Medicine

## 2012-09-02 VITALS — BP 130/80 | HR 105 | Temp 98.1°F | Ht 63.0 in | Wt 185.2 lb

## 2012-09-02 DIAGNOSIS — R609 Edema, unspecified: Secondary | ICD-10-CM

## 2012-09-02 MED ORDER — FUROSEMIDE 20 MG PO TABS: 20.0000 mg | ORAL_TABLET | Freq: Every day | ORAL | Status: DC | PRN

## 2012-09-02 NOTE — Assessment & Plan Note (Signed)
Likely multifactorial- presence of L DVT, limited mobility, not elevating, ? High salt diet.  Pt to start lasix x1-2 weeks and then use PRN.  Script given for knee high compression hose.  Encouraged elevation, low salt diet and increased water intake.  Pt expressed understanding and is in agreement w/ plan.

## 2012-09-02 NOTE — Patient Instructions (Addendum)
Follow up as scheduled in Feb Start the Lasix daily for 1-2 weeks and then as needed for swelling Increase your water intake Elevate your leg as much as possible when you're not active Wear the compression hose daily Call with any questions or concerns Happy New Year!!!

## 2012-09-02 NOTE — Progress Notes (Signed)
  Subjective:    Patient ID: Sarah Mcguire, female    DOB: [birth year], 71 y.o.   MRN: [id number]  HPI Leg swelling- pt reports she was told by Bailey Mech that swelling 'will take time'.  'no one has looked at it since I left the hospital'.  Reports she is not able to wear shoes due to swelling.  Pt w/ L leg DVT and subsequent PE.  No current leg pain.  'i'm just impatient'.  Not elevating leg.  Not currently taking lasix although she has in the past.  Denies current SOB.   Review of Systems For ROS see HPI     Objective:   Physical Exam  Vitals reviewed. Constitutional: She is oriented to person, place, and time. She appears well-developed and well-nourished. No distress.  Musculoskeletal: She exhibits edema (1-2+ L LE edema to level of mid-shin). She exhibits no tenderness (over L lower leg).  Neurological: She is alert and oriented to person, place, and time. Coordination normal.  Skin: Skin is warm and dry.          Assessment & Plan:

## 2012-09-04 ENCOUNTER — Encounter: Payer: Self-pay | Admitting: Family Medicine

## 2012-09-12 ENCOUNTER — Encounter (HOSPITAL_BASED_OUTPATIENT_CLINIC_OR_DEPARTMENT_OTHER): Payer: Self-pay | Admitting: Emergency Medicine

## 2012-09-12 ENCOUNTER — Emergency Department (HOSPITAL_BASED_OUTPATIENT_CLINIC_OR_DEPARTMENT_OTHER)
Admission: EM | Admit: 2012-09-12 | Discharge: 2012-09-12 | Disposition: A | Discharge status: Home / Self Care | Payer: Medicare Other | Attending: Emergency Medicine | Admitting: Emergency Medicine

## 2012-09-12 ENCOUNTER — Telehealth: Payer: Self-pay | Admitting: *Deleted

## 2012-09-12 DIAGNOSIS — Z79899 Other long term (current) drug therapy: Secondary | ICD-10-CM | POA: Insufficient documentation

## 2012-09-12 DIAGNOSIS — K219 Gastro-esophageal reflux disease without esophagitis: Secondary | ICD-10-CM | POA: Insufficient documentation

## 2012-09-12 DIAGNOSIS — Z8719 Personal history of other diseases of the digestive system: Secondary | ICD-10-CM | POA: Insufficient documentation

## 2012-09-12 DIAGNOSIS — Z86711 Personal history of pulmonary embolism: Secondary | ICD-10-CM | POA: Insufficient documentation

## 2012-09-12 DIAGNOSIS — E119 Type 2 diabetes mellitus without complications: Secondary | ICD-10-CM | POA: Insufficient documentation

## 2012-09-12 DIAGNOSIS — E785 Hyperlipidemia, unspecified: Secondary | ICD-10-CM | POA: Insufficient documentation

## 2012-09-12 DIAGNOSIS — I82409 Acute embolism and thrombosis of unspecified deep veins of unspecified lower extremity: Secondary | ICD-10-CM | POA: Insufficient documentation

## 2012-09-12 DIAGNOSIS — M7989 Other specified soft tissue disorders: Secondary | ICD-10-CM | POA: Insufficient documentation

## 2012-09-12 DIAGNOSIS — F411 Generalized anxiety disorder: Secondary | ICD-10-CM | POA: Insufficient documentation

## 2012-09-12 DIAGNOSIS — F17200 Nicotine dependence, unspecified, uncomplicated: Secondary | ICD-10-CM | POA: Insufficient documentation

## 2012-09-12 DIAGNOSIS — Z7901 Long term (current) use of anticoagulants: Secondary | ICD-10-CM | POA: Insufficient documentation

## 2012-09-12 DIAGNOSIS — Z5181 Encounter for therapeutic drug level monitoring: Secondary | ICD-10-CM

## 2012-09-12 DIAGNOSIS — Z862 Personal history of diseases of the blood and blood-forming organs and certain disorders involving the immune mechanism: Secondary | ICD-10-CM | POA: Insufficient documentation

## 2012-09-12 LAB — CBC WITH DIFFERENTIAL/PLATELET
Basophils Absolute: 0 10*3/uL (ref 0.0–0.1)
HCT: 44.5 % (ref 36.0–46.0)
Lymphocytes Relative: 29 % (ref 12–46)
Lymphs Abs: 2.5 10*3/uL (ref 0.7–4.0)
Monocytes Absolute: 1 10*3/uL (ref 0.1–1.0)
Neutro Abs: 4.9 10*3/uL (ref 1.7–7.7)
RBC: 4.94 MIL/uL (ref 3.87–5.11)
RDW: 14.7 % (ref 11.5–15.5)
WBC: 8.5 10*3/uL (ref 4.0–10.5)

## 2012-09-12 LAB — BASIC METABOLIC PANEL WITH GFR
BUN: 34 mg/dL — ABNORMAL HIGH (ref 6–23)
CO2: 29 meq/L (ref 19–32)
Calcium: 10.5 mg/dL (ref 8.4–10.5)
Chloride: 102 meq/L (ref 96–112)
Creatinine, Ser: 0.9 mg/dL (ref 0.50–1.10)
GFR calc Af Amer: 72 mL/min — ABNORMAL LOW
GFR calc non Af Amer: 62 mL/min — ABNORMAL LOW
Glucose, Bld: 152 mg/dL — ABNORMAL HIGH (ref 70–99)
Potassium: 4 meq/L (ref 3.5–5.1)
Sodium: 141 meq/L (ref 135–145)

## 2012-09-12 LAB — PROTIME-INR
INR: 1.66 — ABNORMAL HIGH (ref 0.00–1.49)
Prothrombin Time: 19.1 s — ABNORMAL HIGH (ref 11.6–15.2)

## 2012-09-12 MED ORDER — WARFARIN SODIUM 5 MG PO TABS: 5.0000 mg | ORAL_TABLET | Freq: Once | ORAL | Status: DC

## 2012-09-12 NOTE — ED Notes (Signed)
Family at bedside. 

## 2012-09-12 NOTE — ED Notes (Signed)
MD at bedside. 

## 2012-09-12 NOTE — ED Provider Notes (Signed)
After treatment in the ED the patient feels back to baseline and wants to go home. History     CSN: [id number]  Arrival date & time 09/12/12  1614   First MD Initiated Contact with Patient 09/12/12 1643      Chief Complaint  Patient presents with  . Hypertension  . Leg Swelling    left leg, hx DVT  . Anxiety    (Consider location/radiation/quality/duration/timing/severity/associated sxs/prior treatment) HPI Patient has three-month history of embolism and is currently taking Coumadin.  Her left leg and can use to stay swollen.  She had a pulse rate today was elevated at home her husband felt like she should come in.  She has had her Coumadin checked and has been therapeutic.  She denies being any more short of breath today than she normally is. Past Medical History  Diagnosis Date  . Hyperlipidemia   . Hypertension   . Hyperglycemia   . GERD (gastroesophageal reflux disease)   . Diabetes mellitus   . Anemia   . Leukocytosis   . Diverticulitis   . Neuropathy   . Anxiety     SEVERE  . PE (pulmonary embolism)     Past Surgical History  Procedure Date  . Tonsillectomy   . Tympanostomy tube placement     R ear    Family History  Problem Relation Age of Onset  . Diabetes Mother   . Hypertension Mother   . Heart disease Father     MI age 64's   . Diabetes Brother     History  Substance Use Topics  . Smoking status: Current Every Day Smoker -- 0.8 packs/day    Types: Cigarettes  . Smokeless tobacco: Never Used  . Alcohol Use: No    OB History    Grav Para Term Preterm Abortions TAB SAB Ect Mult Living   2 2 2       2       Review of Systems All other systems reviewed and are negative Allergies  Amoxicillin; Amoxicillin-pot clavulanate; Citalopram hydrobromide; Clonazepam; Cymbalta; Macrobid; Penicillins; Pyridium; Zonegran; Zonisamide; Sertraline hcl; and Wellbutrin  Home Medications   Current Outpatient Rx  Name  Route  Sig  Dispense  Refill  .  ACETAMINOPHEN 325 MG PO TABS   Oral   Take 2 tablets (650 mg total) by mouth every 6 (six) hours as needed (or Fever >/= 101).   100 tablet   0   . ALPRAZOLAM 2 MG PO TABS   Oral   Take 0.5-1 tablets (1-2 mg total) by mouth 3 (three) times daily as needed. For anxiety/sleep.   90 tablet   3   . AMLODIPINE BESYLATE 5 MG PO TABS   Oral   Take 1 tablet (5 mg total) by mouth daily.   90 tablet   3   . AZITHROMYCIN 250 MG PO TABS      2 tabs on day 1, 1 tab on day 2-5   6 tablet   0   . BENAZEPRIL HCL 20 MG PO TABS   Oral   Take 1 tablet (20 mg total) by mouth daily.   90 tablet   3   . CALCIUM + D PO   Oral   Take 1 tablet by mouth daily.         Marland Kitchen VITAMIN D 2000 UNITS PO TABS   Oral   Take 2,000 Units by mouth daily.         Marland Kitchen CLORAZEPATE DIPOTASSIUM  7.5 MG PO TABS   Oral   Take 7.5 mg by mouth 2 (two) times daily.          Marland Kitchen ENOXAPARIN SODIUM 120 MG/0.8ML [place] SOLN   Subcutaneous   Inject 0.8 mLs (120 mg total) into the skin daily.   7 Syringe   0   . ESOMEPRAZOLE MAGNESIUM 40 MG PO CPDR   Oral   Take 40 mg by mouth daily as needed. For acid reflux.         . FENOFIBRATE 160 MG PO TABS   Oral   Take 1 tablet (160 mg total) by mouth daily.   90 tablet   1   . FOLIC ACID 1 MG PO TABS   Oral   Take 1 tablet (1 mg total) by mouth daily.   90 tablet   1   . FUROSEMIDE 20 MG PO TABS   Oral   Take 1 tablet (20 mg total) by mouth daily as needed.   30 tablet   1   . GABAPENTIN 100 MG PO CAPS               . HYDROCODONE-ACETAMINOPHEN 5-500 MG PO TABS   Oral   Take 1 tablet by mouth every 6 (six) hours as needed. For pain.         Marland Kitchen HYDROXYZINE HCL 10 MG PO TABS   Oral   Take 10 mg by mouth every 6 (six) hours as needed.          Marland Kitchen ONE-DAILY MULTI VITAMINS PO TABS   Oral   Take 1 tablet by mouth daily.           . ONETOUCH ULTRA BLUE VI STRP               . PATANASE 0.6 % NA SOLN               . SIMVASTATIN 20 MG PO  TABS   Oral   Take 1 tablet (20 mg total) by mouth at bedtime.   90 tablet   0   . TRAMADOL HCL 50 MG PO TABS   Oral   Take 50 mg by mouth every 6 (six) hours as needed. For pain         . VOLTAREN 1 % TD GEL               . WARFARIN SODIUM 5 MG PO TABS   Oral   Take 1 tablet (5 mg total) by mouth daily. Take as directed by coumadin clinic.   40 tablet   3     30 day supply.   . WARFARIN SODIUM 7.5 MG PO TABS   Oral   Take 1 tablet (7.5 mg total) by mouth daily.   7 tablet   0     At 18:00 PM.     BP 127/77  Pulse 112  Temp 98.5 F (36.9 C) (Oral)  Resp 20  Ht 5\' 3"  (1.6 m)  Wt 170 lb (77.111 kg)  BMI 30.11 kg/m2  SpO2 92%  Physical Exam  Nursing note and vitals reviewed. Constitutional: She is oriented to person, place, and time. She appears well-developed and well-nourished. No distress.  HENT:  Head: Normocephalic and atraumatic.  Eyes: Pupils are equal, round, and reactive to light.  Neck: Normal range of motion.  Cardiovascular: Normal rate and intact distal pulses.   Pulses:      Dorsalis pedis pulses are 2+ on  the right side, and 2+ on the left side.       Posterior tibial pulses are 2+ on the right side, and 2+ on the left side.  Pulmonary/Chest: No respiratory distress.  Abdominal: Normal appearance. She exhibits no distension.  Musculoskeletal: Normal range of motion.       Left lower leg: She exhibits swelling. She exhibits no tenderness.  Neurological: She is alert and oriented to person, place, and time. No cranial nerve deficit.  Skin: Skin is warm and dry. No rash noted.  Psychiatric: She has a normal mood and affect. Her behavior is normal.    ED Course  Procedures (including critical care time) Pulse rate of 112 is the same pulse she had at discharge in October.  INR of 1.6 is a trough level in that she takes her Coumadin every night at 6 PM. Labs Reviewed  PROTIME-INR - Abnormal; Notable for the following:    Prothrombin Time  19.1 (*)     INR 1.66 (*)     All other components within normal limits  BASIC METABOLIC PANEL - Abnormal; Notable for the following:    Glucose, Bld 152 (*)     BUN 34 (*)     GFR calc non Af Amer 62 (*)     GFR calc Af Amer 72 (*)     All other components within normal limits  CBC WITH DIFFERENTIAL   No results found.   1. DVT (deep venous thrombosis)   2. Anticoagulation goal of INR 1.5 to 2.5       MDM   patient feels back to baseline and wants to go home.       Nelia Shi, MD 09/13/12 [phone]

## 2012-09-12 NOTE — ED Notes (Signed)
C/O HTN 127/98, pulse 120 at home. Called PCP told to come here for evaluation. Pt c/o left leg swelling, reports hx PE & DVT October 2013. Pt reports anxiety as well.

## 2012-09-12 NOTE — Telephone Encounter (Signed)
noted 

## 2012-09-12 NOTE — Telephone Encounter (Signed)
Pt husband called stating that wife is still having swelling in left leg despite starting lasix. Pt husband notes that Pt BP 127/98 and pulse 120. Pt husband states that he has appt for Monday but would like to know if she should go to ED/UC today. Spoke with Dr Beverely Low verbally advised her of Pt situation and was advise to inform Pt husband that Pt needs to go to UC due to increase pulse. Pt husband ok but wants to know if he needs to keep OV with Dr Beverely Low on Monday advise him no since they were going to ED no need to keep appt will cancel OV.

## 2012-09-12 NOTE — ED Notes (Signed)
Pt has taking their own coumadin 5 mg po now directed by ERMD

## 2012-09-15 ENCOUNTER — Ambulatory Visit: Payer: Medicare Other | Admitting: Family Medicine

## 2012-09-23 ENCOUNTER — Ambulatory Visit (INDEPENDENT_AMBULATORY_CARE_PROVIDER_SITE_OTHER): Payer: Medicare Other | Admitting: General Practice

## 2012-09-23 DIAGNOSIS — I2699 Other pulmonary embolism without acute cor pulmonale: Secondary | ICD-10-CM

## 2012-09-23 DIAGNOSIS — I82409 Acute embolism and thrombosis of unspecified deep veins of unspecified lower extremity: Secondary | ICD-10-CM

## 2012-09-23 DIAGNOSIS — Z7901 Long term (current) use of anticoagulants: Secondary | ICD-10-CM

## 2012-09-23 LAB — POCT INR: INR: 2.7

## 2012-10-07 ENCOUNTER — Encounter: Payer: Self-pay | Admitting: Lab

## 2012-10-08 ENCOUNTER — Ambulatory Visit (INDEPENDENT_AMBULATORY_CARE_PROVIDER_SITE_OTHER): Payer: Medicare Other | Admitting: Family Medicine

## 2012-10-08 ENCOUNTER — Encounter: Payer: Self-pay | Admitting: Family Medicine

## 2012-10-08 VITALS — BP 128/80 | HR 100 | Temp 97.9°F | Ht 62.5 in | Wt 190.4 lb

## 2012-10-08 DIAGNOSIS — F419 Anxiety disorder, unspecified: Secondary | ICD-10-CM

## 2012-10-08 DIAGNOSIS — R609 Edema, unspecified: Secondary | ICD-10-CM

## 2012-10-08 DIAGNOSIS — F411 Generalized anxiety disorder: Secondary | ICD-10-CM

## 2012-10-08 DIAGNOSIS — E119 Type 2 diabetes mellitus without complications: Secondary | ICD-10-CM

## 2012-10-08 LAB — POCT URINALYSIS DIPSTICK
Bilirubin, UA: NEGATIVE
Glucose, UA: NEGATIVE
Ketones, UA: NEGATIVE
Leukocytes, UA: NEGATIVE

## 2012-10-08 LAB — BASIC METABOLIC PANEL
Chloride: 104 mEq/L (ref 96–112)
Potassium: 4.1 mEq/L (ref 3.5–5.1)

## 2012-10-08 LAB — HEMOGLOBIN A1C: Hgb A1c MFr Bld: 6.7 % — ABNORMAL HIGH (ref 4.6–6.5)

## 2012-10-08 MED ORDER — ALPRAZOLAM 2 MG PO TABS: 1.0000 mg | ORAL_TABLET | Freq: Three times a day (TID) | ORAL | Status: DC | PRN

## 2012-10-08 NOTE — Progress Notes (Signed)
  Subjective:    Patient ID: Sarah Mcguire, female    DOB: [birth year], 72 y.o.   MRN: [id number]  HPI DM- chronic problem, diet controlled.  CBGs ranging 90s-110s.  Not exercising due to L LE DVT and subsequent swelling.  UTD on eye exam.  Denies symptomatic lows.  Felt lasix caused increase in sugar.  No CP, SOB, HAs, visual changes.  + edema  Edema- no longer taking lasix due to pt thinking that this elevated CBGs and no improvement in swelling.  Pt has been to ER, discussed this w/ Coumadin clinic.  Anxiety- pt continues to stress about DVT, PE, swelling.   Review of Systems For ROS see HPI     Objective:   Physical Exam  Vitals reviewed. Constitutional: She is oriented to person, place, and time. She appears well-developed and well-nourished. No distress.  HENT:  Head: Normocephalic and atraumatic.  Eyes: Conjunctivae and EOM are normal. Pupils are equal, round, and reactive to light.  Neck: Normal range of motion. Neck supple. No thyromegaly present.  Cardiovascular: Normal rate, regular rhythm, normal heart sounds and intact distal pulses.   No murmur heard. Pulmonary/Chest: Effort normal and breath sounds normal. No respiratory distress.  Abdominal: Soft. She exhibits no distension. There is no tenderness.  Musculoskeletal: She exhibits edema (LLE edema, 1+ pitting).  Lymphadenopathy:    She has no cervical adenopathy.  Neurological: She is alert and oriented to person, place, and time.  Skin: Skin is warm and dry.  Psychiatric: She has a normal mood and affect. Her behavior is normal.          Assessment & Plan:

## 2012-10-08 NOTE — Patient Instructions (Addendum)
Follow up in 3 months to recheck diabetes We'll notify you of your lab results and make any changes Try and walk more- transition to your cane Elevate your leg when sitting It's ok to rub If the swelling continues for the next 2 months- call and we'll get you into Vascular Call with any questions or concerns Hang in there!!!

## 2012-10-14 NOTE — Assessment & Plan Note (Addendum)
Unchanged.  Pt has stopped Lasix.  Discussed need for regular activity to act as pump to move fluid.  Elevation when sitting.  Will follow.  If no improvement, will refer to vascular since this is major cause of anxiety for pt.  Pt expressed understanding and is in agreement w/ plan.

## 2012-10-14 NOTE — Assessment & Plan Note (Signed)
Unchanged.  Due for refill on xanax.  Pt to sign controlled substance agreement and do UDS.

## 2012-10-14 NOTE — Assessment & Plan Note (Signed)
Ongoing issue.  CBGs continue to look good.  UTD on eye exam.  Check A1C.

## 2012-10-16 ENCOUNTER — Encounter: Payer: Self-pay | Admitting: Family Medicine

## 2012-10-18 NOTE — Telephone Encounter (Signed)
Please advise.//AB/CMA 

## 2012-10-18 NOTE — Addendum Note (Signed)
Addended by: Sheliah Hatch on: 10/18/2012 01:54 PM   Modules accepted: Orders

## 2012-10-21 ENCOUNTER — Other Ambulatory Visit: Payer: Self-pay | Admitting: *Deleted

## 2012-10-21 ENCOUNTER — Ambulatory Visit (INDEPENDENT_AMBULATORY_CARE_PROVIDER_SITE_OTHER): Payer: Medicare Other | Admitting: General Practice

## 2012-10-21 DIAGNOSIS — I82409 Acute embolism and thrombosis of unspecified deep veins of unspecified lower extremity: Secondary | ICD-10-CM

## 2012-10-21 DIAGNOSIS — I2699 Other pulmonary embolism without acute cor pulmonale: Secondary | ICD-10-CM

## 2012-10-21 DIAGNOSIS — M7989 Other specified soft tissue disorders: Secondary | ICD-10-CM

## 2012-10-21 DIAGNOSIS — Z7901 Long term (current) use of anticoagulants: Secondary | ICD-10-CM

## 2012-10-21 LAB — POCT INR: INR: 2.9

## 2012-10-30 ENCOUNTER — Ambulatory Visit (INDEPENDENT_AMBULATORY_CARE_PROVIDER_SITE_OTHER): Payer: Medicare Other | Admitting: Family Medicine

## 2012-10-30 ENCOUNTER — Encounter: Payer: Self-pay | Admitting: Family Medicine

## 2012-10-30 VITALS — BP 120/80 | HR 108 | Temp 98.2°F | Ht 62.5 in

## 2012-10-30 DIAGNOSIS — R609 Edema, unspecified: Secondary | ICD-10-CM

## 2012-10-30 DIAGNOSIS — M25569 Pain in unspecified knee: Secondary | ICD-10-CM

## 2012-10-30 DIAGNOSIS — M25562 Pain in left knee: Secondary | ICD-10-CM

## 2012-10-30 MED ORDER — TORSEMIDE 5 MG PO TABS: 5.0000 mg | ORAL_TABLET | Freq: Every day | ORAL | Status: DC

## 2012-10-30 MED ORDER — HYDROCODONE-ACETAMINOPHEN 5-500 MG PO TABS: 1.0000 | ORAL_TABLET | Freq: Four times a day (QID) | ORAL | Status: DC | PRN

## 2012-10-30 NOTE — Patient Instructions (Addendum)
Schedule a lab appt in 2 weeks to recheck potassium We'll let you know about your vascular appt Start the [facility] daily for the fluid Call with any questions or concerns Hang in there!

## 2012-10-30 NOTE — Progress Notes (Signed)
  Subjective:    Patient ID: Sarah Mcguire, female    DOB: 06/20/1941, 72 y.o.   MRN: [id number]  HPI Leg swelling- now wearing a sneaker which is first time since blood clot in October.  Continues to ambulate w/ walker.  Now having L knee pain.  Wearing compression hose.  No redness.  No CP, SOB, swelling of hands, only trace edema on R.  Pt very distraught about this.  Has appt upcoming w/ vascular.  Still not active- prefers to sit most of the day.  Reports Lasix caused her CBGs to spike.  Not currently on diuretic.   Review of Systems For ROS see HPI     Objective:   Physical Exam  Vitals reviewed. Constitutional: She is oriented to person, place, and time. She appears well-developed and well-nourished. She appears distressed (tearful, anxious).  HENT:  Head: Normocephalic and atraumatic.  Neck: Neck supple. No thyromegaly present.  Cardiovascular: Normal rate, regular rhythm, normal heart sounds and intact distal pulses.   Pulmonary/Chest: Effort normal and breath sounds normal. No respiratory distress. She has no wheezes. She has no rales.  Musculoskeletal: She exhibits edema (1-2+ pitting edema of L lower leg to just below knee, trace edema on R LE). She exhibits no tenderness.  Lymphadenopathy:    She has no cervical adenopathy.  Neurological: She is alert and oriented to person, place, and time.  Skin: Skin is warm and dry. No erythema.  Psychiatric:  Anxious, tearful          Assessment & Plan:

## 2012-10-31 ENCOUNTER — Other Ambulatory Visit: Payer: Self-pay | Admitting: Family Medicine

## 2012-10-31 DIAGNOSIS — M25562 Pain in left knee: Secondary | ICD-10-CM | POA: Insufficient documentation

## 2012-10-31 NOTE — Assessment & Plan Note (Signed)
Currently seeing coumadin clinic and has appt upcoming w/ vascular

## 2012-10-31 NOTE — Assessment & Plan Note (Signed)
Unchanged.  Pt continues to have L leg swelling.  Most likely due to DVT but also due to lack of activity.  Again discussed use of a diuretic for symptomatic relief.  Pt unwilling to restart lasix, will start demadex.  Again called vascular to see if appt could be sooner than 2 weeks- they have no openings at this time.  Will follow.

## 2012-10-31 NOTE — Assessment & Plan Note (Signed)
New.  Suspect this is due to altered gait from swelling/DVT.  Pt requesting vicodin for pain relief.  Stressed need to walk as normally as possible.  Will follow.

## 2012-11-03 ENCOUNTER — Ambulatory Visit (INDEPENDENT_AMBULATORY_CARE_PROVIDER_SITE_OTHER): Payer: Medicare Other | Admitting: Vascular Surgery

## 2012-11-03 ENCOUNTER — Encounter: Payer: Self-pay | Admitting: Vascular Surgery

## 2012-11-03 VITALS — BP 141/89 | HR 109 | Resp 18 | Ht 63.0 in | Wt 186.0 lb

## 2012-11-03 DIAGNOSIS — I82409 Acute embolism and thrombosis of unspecified deep veins of unspecified lower extremity: Secondary | ICD-10-CM

## 2012-11-03 DIAGNOSIS — M7989 Other specified soft tissue disorders: Secondary | ICD-10-CM

## 2012-11-03 DIAGNOSIS — Z86718 Personal history of other venous thrombosis and embolism: Secondary | ICD-10-CM

## 2012-11-03 DIAGNOSIS — I8000 Phlebitis and thrombophlebitis of superficial vessels of unspecified lower extremity: Secondary | ICD-10-CM

## 2012-11-03 DIAGNOSIS — M79609 Pain in unspecified limb: Secondary | ICD-10-CM

## 2012-11-03 NOTE — Progress Notes (Signed)
Subjective:     Patient ID: Sarah Mcguire, female   DOB: 28-Apr-1941, 72 y.o.   MRN: [id number]  HPI this 72 year old female was referred by Dr. Beverely Low for a history of left leg DVT and pulmonary embolus. This patient in October developed acute tachycardia and a diagnosis of DVT in the left calf with pulmonary embolus was made. Patient has been treated with heparin followed by Coumadin. She has worn short leg elastic compression stockings to some degree. The initial ones that were fitted were much too tight. She does not elevate her legs at night or during the day. She has been on some diuretics. She continues to have significant swelling in the left calf. She's had no symptoms in the contralateral right leg. She had no previous history of DVT or pulmonary emboli.  Past Medical History  Diagnosis Date  . Hyperlipidemia   . Hypertension   . Hyperglycemia   . GERD (gastroesophageal reflux disease)   . Diabetes mellitus   . Anemia   . Leukocytosis   . Diverticulitis   . Neuropathy   . Anxiety     SEVERE  . PE (pulmonary embolism)     History  Substance Use Topics  . Smoking status: Current Every Day Smoker -- 0.80 packs/day    Types: Cigarettes  . Smokeless tobacco: Never Used  . Alcohol Use: No    Family History  Problem Relation Age of Onset  . Diabetes Mother   . Hypertension Mother   . Heart disease Father     MI age 89's   . Diabetes Brother   . Hypertension Brother     Allergies  Allergen Reactions  . Amoxicillin   . Amoxicillin-Pot Clavulanate   . Citalopram Hydrobromide     CELEXA  . Clonazepam Itching  . Cymbalta (Duloxetine Hcl)     Nausea headache and diarrhea   . Macrobid (Nitrofurantoin Macrocrystal)     Nausea, vomitting  . Penicillins   . Pyridium (Phenazopyridine Hcl)     headache  . Zonegran   . Zonisamide   . Sertraline Hcl Anxiety  . Wellbutrin (Bupropion) Itching and Rash    Itching, rash, hyper     Current outpatient  prescriptions:acetaminophen (TYLENOL) 325 MG tablet, Take 2 tablets (650 mg total) by mouth every 6 (six) hours as needed (or Fever >/= 101)., Disp: 100 tablet, Rfl: 0;  alprazolam (XANAX) 2 MG tablet, Take 0.5-1 tablets (1-2 mg total) by mouth 3 (three) times daily as needed. For anxiety/sleep., Disp: 90 tablet, Rfl: 3;  amLODipine (NORVASC) 5 MG tablet, Take 1 tablet (5 mg total) by mouth daily., Disp: 90 tablet, Rfl: 3 azithromycin (ZITHROMAX) 250 MG tablet, 2 tabs on day 1, 1 tab on day 2-5, Disp: 6 tablet, Rfl: 0;  benazepril (LOTENSIN) 20 MG tablet, Take 1 tablet (20 mg total) by mouth daily., Disp: 90 tablet, Rfl: 3;  Calcium Carbonate-Vitamin D (CALCIUM + D PO), Take 1 tablet by mouth daily., Disp: , Rfl: ;  Cholecalciferol (VITAMIN D) 2000 UNITS tablet, Take 2,000 Units by mouth daily., Disp: , Rfl:  clorazepate (TRANXENE) 7.5 MG tablet, Take 7.5 mg by mouth 2 (two) times daily. , Disp: , Rfl: ;  enoxaparin (LOVENOX) 120 MG/0.8ML injection, Inject 0.8 mLs (120 mg total) into the skin daily., Disp: 7 Syringe, Rfl: 0;  esomeprazole (NEXIUM) 40 MG capsule, Take 40 mg by mouth daily as needed. For acid reflux., Disp: , Rfl: ;  fenofibrate 160 MG tablet, Take 1 tablet (160  mg total) by mouth daily., Disp: 90 tablet, Rfl: 1 folic acid (FOLVITE) 1 MG tablet, Take 1 tablet (1 mg total) by mouth daily., Disp: 90 tablet, Rfl: 1;  gabapentin (NEURONTIN) 100 MG capsule, , Disp: , Rfl: ;  HYDROcodone-acetaminophen (VICODIN) 5-500 MG per tablet, Take 1 tablet by mouth every 6 (six) hours as needed. For pain., Disp: 45 tablet, Rfl: 0;  hydrOXYzine (ATARAX/VISTARIL) 10 MG tablet, Take 10 mg by mouth every 6 (six) hours as needed. , Disp: , Rfl:  Multiple Vitamin (MULTIVITAMIN) tablet, Take 1 tablet by mouth daily.  , Disp: , Rfl: ;  ONE TOUCH ULTRA TEST test strip, , Disp: , Rfl: ;  PATANASE 0.6 % SOLN, , Disp: , Rfl: ;  simvastatin (ZOCOR) 20 MG tablet, Take 1 tablet (20 mg total) by mouth at bedtime., Disp: 90  tablet, Rfl: 0;  torsemide (DEMADEX) 5 MG tablet, Take 1 tablet (5 mg total) by mouth daily., Disp: 30 tablet, Rfl: 3 traMADol (ULTRAM) 50 MG tablet, Take 50 mg by mouth every 6 (six) hours as needed. For pain, Disp: , Rfl: ;  VOLTAREN 1 % GEL, , Disp: , Rfl: ;  warfarin (COUMADIN) 5 MG tablet, TAKE 1 TABLET (5 MG TOTAL) BY MOUTH DAILY. TAKE AS DIRECTED BY COUMADIN CLINIC., Disp: 40 tablet, Rfl: 3;  warfarin (COUMADIN) 7.5 MG tablet, Take 1 tablet (7.5 mg total) by mouth daily., Disp: 7 tablet, Rfl: 0  BP 141/89  Pulse 109  Resp 18  Ht 5\' 3"  (1.6 m)  Wt 186 lb (84.369 kg)  BMI 32.96 kg/m2  Body mass index is 32.96 kg/(m^2).           Review of Systems Is unstable with ambulation requiring a cane or walker. Denies chest pain but does have dyspnea on exertion is not ambulate long distances.      Objective:   Physical Exam blood pressure 141/89 heart rate 109 respirations 18 Gen.-alert and oriented x3 in no apparent distress HEENT normal for age Lungs no rhonchi or wheezing Cardiovascular regular rhythm no murmurs carotid pulses 3+ palpable no bruits audible Abdomen soft nontender no palpable masses Musculoskeletal free of  major deformities Skin clear -no rashes Neurologic normal Lower extremities 3+ femoral and dorsalis pedis pulses palpable bilaterally with2+ edema left calf ankle and foot compared to right. Proximally 3 cm increase in the circumferential size left greater than right and calf. No stasis ulcers noted. No bulging varicosities noted.  Today I ordered a venous duplex exam of the left leg which are reviewed and interpreted. There is subacute DVT involving the tibial veins on the left particularly the posterior tibial vein. Because of marked edema study was technically difficult. There was no proximal DVT no noted in the iliofemoral system. The great and small saphenous veins were normal.        Assessment:     #1  Subacute DVT left tibial veins which occurred in  October along with pulmonary embolus #2 persistent swelling secondary to #1     Plan:     #1 patient needs good elastic compression stockings 20-30 mm gradient to be worn on a daily basis #2 patient and her husband were instructed at length regarding technique of elevation of foot of bed at night to be certain that she gets maximal improvement while off her feet and then stockings to be applied prior to getting out of bed in the morning #3 Will leave diuretic management up to the medical doctors #4 continue Coumadin  for 6-12 months and then could consider discontinuing it. She has any further DVT in the future will need Coumadin for life

## 2012-11-03 NOTE — Progress Notes (Signed)
Left lower extremity venous duplex performed @ [facility] 11/03/2012

## 2012-11-04 ENCOUNTER — Encounter: Payer: Self-pay | Admitting: Family Medicine

## 2012-11-10 ENCOUNTER — Ambulatory Visit (INDEPENDENT_AMBULATORY_CARE_PROVIDER_SITE_OTHER): Payer: Medicare Other | Admitting: Family Medicine

## 2012-11-10 ENCOUNTER — Encounter: Payer: Self-pay | Admitting: Family Medicine

## 2012-11-10 VITALS — BP 108/70 | HR 114 | Temp 98.7°F | Ht 62.5 in | Wt 193.0 lb

## 2012-11-10 NOTE — Patient Instructions (Addendum)
Call and schedule your appt w/ Dr Regino Schultze I think your knee pain is due to the swelling and the fact that you've been walking differently Continue the hydrocodone as need for severe pain Call with any questions or concerns Hang in there!!!

## 2012-11-10 NOTE — Progress Notes (Signed)
  Subjective:    Patient ID: Sarah Mcguire, female    DOB: 06-05-1941, 72 y.o.   MRN: [id number]  HPI L knee pain- pt went to vascular for her LE swelling and was told that as long as she was anticoagulated she didn't need to come back.  Pt and husband did not like the doctor they saw.  Got new pair of compression stockings.  Now having pain around L knee circumferentially.  'it's worsening everyday'.  No relief w/ hydrocodone.  Swelling of lower leg has improved.  Has seen Dr Regino Schultze ([facility]) previously.  No pain w/ sitting, + pain w/ weight bearing.   Review of Systems For ROS see HPI     Objective:   Physical Exam  Vitals reviewed. Constitutional: She appears well-developed and well-nourished. No distress.  Musculoskeletal: She exhibits edema (trace to 1+ edema of LLE).  L knee w/ visible arthritic change. No TTP over joint line No pain w/ flexion/extension of L knee + pain w/ weight bearing          Assessment & Plan:

## 2012-11-13 ENCOUNTER — Other Ambulatory Visit: Payer: Medicare Other

## 2012-11-13 ENCOUNTER — Encounter: Payer: Self-pay | Admitting: Family Medicine

## 2012-11-13 ENCOUNTER — Ambulatory Visit: Payer: Medicare Other | Admitting: Family Medicine

## 2012-11-14 ENCOUNTER — Encounter: Payer: Medicare Other | Admitting: Vascular Surgery

## 2012-11-16 NOTE — Assessment & Plan Note (Signed)
Ongoing problem.  Again, this is likely due to pt changing her gait from DVT and LE edema.  Pt has Ortho Regino Schultze)- encouraged her to call and set up appt.  Pt has pain meds available.  Reviewed supportive care and red flags that should prompt return.  Pt expressed understanding and is in agreement w/ plan.

## 2012-11-17 ENCOUNTER — Other Ambulatory Visit: Payer: Self-pay | Admitting: Family Medicine

## 2012-11-18 ENCOUNTER — Ambulatory Visit (INDEPENDENT_AMBULATORY_CARE_PROVIDER_SITE_OTHER): Payer: Medicare Other | Admitting: General Practice

## 2012-11-18 DIAGNOSIS — I2699 Other pulmonary embolism without acute cor pulmonale: Secondary | ICD-10-CM

## 2012-11-18 LAB — POCT INR: INR: 3.9

## 2012-11-19 ENCOUNTER — Telehealth: Payer: Self-pay | Admitting: *Deleted

## 2012-11-19 NOTE — Telephone Encounter (Signed)
Attempted to contact patient at the request of Dr. Beverely Low to clarify why she is getting Alprazolam from 2 different MD's. Letter recieved from [employer] stating concern with "polypharmacy duplicate therapy". Statement from insurance company shows that pt has filled Rx's for Alprazolam 2mg  on 05/14/2012 #90; Alprazolam 1mg  #100 on 05/23/2012; Alprazolam 2mg  #90 on 06/16/2012 and Alprazolam 1mg  #100 on 07/04/2012. Called home number [email or web address] to return call.

## 2012-11-19 NOTE — Telephone Encounter (Signed)
Patient returned my call, states she is no longer seeing Dr. Donell Beers and only getting Xanax from Dr. Beverely Low. I advised that using multiple physicians to obtain controlled substances is not a good practice and refrain from that in the future.

## 2012-11-20 ENCOUNTER — Telehealth: Payer: Self-pay | Admitting: General Practice

## 2012-11-20 ENCOUNTER — Encounter (HOSPITAL_COMMUNITY): Payer: Self-pay | Admitting: Pharmacy Technician

## 2012-11-20 NOTE — Telephone Encounter (Signed)
Left message for Bailey Mech, RN to call concerning TKR on Monday.

## 2012-11-20 NOTE — Telephone Encounter (Signed)
[facility] instructing patient to stop coumadin until after surgery.  Left message with Dr. Beverely Low conerning Lovenox bridge.   Will contact pt on Friday.

## 2012-11-21 ENCOUNTER — Other Ambulatory Visit: Payer: Self-pay | Admitting: Orthopedic Surgery

## 2012-11-21 ENCOUNTER — Encounter (HOSPITAL_COMMUNITY)
Admission: RE | Admit: 2012-11-21 | Discharge: 2012-11-21 | Disposition: A | Discharge status: Home / Self Care | Payer: Medicare Other | Source: Ambulatory Visit | Attending: Orthopedic Surgery | Admitting: Orthopedic Surgery

## 2012-11-21 ENCOUNTER — Telehealth: Payer: Self-pay | Admitting: General Practice

## 2012-11-21 ENCOUNTER — Other Ambulatory Visit: Payer: Self-pay | Admitting: General Practice

## 2012-11-21 ENCOUNTER — Encounter (HOSPITAL_COMMUNITY): Payer: Self-pay

## 2012-11-21 HISTORY — DX: Unspecified osteoarthritis, unspecified site: M19.90

## 2012-11-21 HISTORY — DX: Acute embolism and thrombosis of unspecified deep veins of unspecified lower extremity: I82.409

## 2012-11-21 LAB — BASIC METABOLIC PANEL
BUN: 35 mg/dL — ABNORMAL HIGH (ref 6–23)
Calcium: 10.6 mg/dL — ABNORMAL HIGH (ref 8.4–10.5)
Creatinine, Ser: 0.81 mg/dL (ref 0.50–1.10)
GFR calc Af Amer: 82 mL/min — ABNORMAL LOW (ref >90)
GFR calc non Af Amer: 71 mL/min — ABNORMAL LOW (ref >90)
Glucose, Bld: 143 mg/dL — ABNORMAL HIGH (ref 70–99)

## 2012-11-21 LAB — DIFFERENTIAL
Eosinophils Absolute: 0.1 10*3/uL (ref 0.0–0.7)
Lymphs Abs: 2.8 10*3/uL (ref 0.7–4.0)
Monocytes Absolute: 1.4 10*3/uL — ABNORMAL HIGH (ref 0.1–1.0)
Monocytes Relative: 12 % (ref 3–12)
Neutrophils Relative %: 64 % (ref 43–77)

## 2012-11-21 LAB — TYPE AND SCREEN: ABO/RH(D): A POS

## 2012-11-21 LAB — CBC
MCH: 29.1 pg (ref 26.0–34.0)
MCHC: 33.6 g/dL (ref 30.0–36.0)
Platelets: 341 10*3/uL (ref 150–400)
RDW: 15.6 % — ABNORMAL HIGH (ref 11.5–15.5)

## 2012-11-21 LAB — SURGICAL PCR SCREEN: MRSA, PCR: NEGATIVE

## 2012-11-21 MED ORDER — ENOXAPARIN SODIUM 150 MG/ML ~~LOC~~ SOLN: 1.5000 mg/kg | Freq: Every day | SUBCUTANEOUS | Status: DC

## 2012-11-21 NOTE — Progress Notes (Signed)
Anesthesia chart review: Patient is a 72 year old female scheduled for left TKA by Dr. Turner Daniels on Monday 11/24/2012. Her PAT appointment was Friday, 11/21/2012. I was going to evaluate patient during her PAT visit, but she had to leave for a hair appointment.  History included LLE DVT and bilateral PE 06/25/13 (with history of LLE stress fracture ~ 5 weeks prior), HTN, HLD, diet controlled DM, GERD, obesity, anemia, diverticulitis '11, anxiety, smoking. PCP is Dr. Beverely Low, who was just notified of surgery today and recommended Lovenox bridging while patient is off of Coumadin.  She was seen by cardiologist Dr. Eden Emms in 01/31/10 for evaluation of decreased LV function (45-50% by 12/2009 echo) in the setting of sepsis related to diverticulitis.  She had a repeat echo on 01/31/10 which by report still showsed EF of 45-50% with septal bounce, but by his note, "I reviewed her echo from today and she still has abnormal septal motion with EF improved at 50-55%."  Other echo findings showed mildly dilated aortic root, mild MR, mildly dilated LA, mildly dilated RV with normal systolic function.  She subsequently had stress test which was low risk (see below).  Nuclear stress test on 02/02/10 showed normal perfusion, septal hypokinesis, EF 55%, low risk stress nuclear study.  EKG on 06/26/13 showed SR, PACs.  Chest x-ray on 03/21/2012 showed no evidence of active pulmonary disease.  Labs from 11/21/12 noted.  Orders were pending at the time of her PAT visit, so additional labs to be done on the day of surgery.  She will need a PT/PTT for sure.  I have reviewed her history including PE in 06/2012 with Anesthesiologist Dr. Noreene Larsson.  Since PCP is aware of planned procedure, and patient will be covered with a Lovenox bridge then would anticipate she could proceed if no acute cardiopulmonary symptoms on her preoperative evaluation and follow-up labs are reasonable.  Velna Ochs [facility] Short Stay  Center/Anesthesiology Phone [phone] 11/21/2012 1:28 PM

## 2012-11-21 NOTE — Telephone Encounter (Signed)
Patient is having TKR on Monday 3/24.  Instructed patient to do the following: 3/19 last dose of coumadin 3/20 No Lovenox and No coumadin 3/21 Lovenox in AM and PM - No coumadin 3/22 Lovenox in AM and PM - No coumadin 3/23 Lovenox in AM only 3/24 - Surgery  Call coumadin clinic when you return home from hospital to schedule INR appointment.  Patient taking 80 mg lovenox bid.

## 2012-11-21 NOTE — Telephone Encounter (Signed)
Message copied by Garrison Columbus on Fri Nov 21, 2012  8:40 AM ------      Message from: Sheliah Hatch      Created: Thu Nov 20, 2012  5:04 PM       Yes!  She needs therapeutic dose Lovenox both during and after surgery.            That was fast...            ----- Message -----         From: Garrison Columbus, RN         Sent: 11/20/2012   4:58 PM           To: Sheliah Hatch, MD            Pt is having a TKR on Monday.  I just found this out.  Do you want her covered with Lovenox?  Please let me know asap.            Thanks!      Bailey Mech, RN      [facility]       ------

## 2012-11-21 NOTE — Pre-Procedure Instructions (Signed)
Sarah Mcguire  11/21/2012   Your procedure is scheduled on:  11-24-2012  Report to [facility] Short Stay Center at 7:00 AM.  Call this number if you have problems the morning of surgery: [phone]   Remember:   Do not eat food or drink liquids after midnight   Take these medicines the morning of surgery with A SIP OF WATER: alprazolam(xanax),amlodipine(norvasc),nexium,pain medication as needed   Do not wear jewelry, make-up or nail polish.  Do not wear lotions, powders, or perfumes. You may wear deodorant.  Do not shave 48 hours prior to surgery.   Do not bring valuables to the hospital.  Contacts, dentures or bridgework may not be worn into surgery.   Leave suitcase in the car. After surgery it may be brought to your room .  For patients admitted to the hospital, checkout time is 11:00 AM the day of discharge.   Patients discharged the day of surgery will not be allowed to drive home.    Special Instructions: Shower using CHG 2 nights before surgery and the night before surgery.  If you shower the day of surgery use CHG.  Use special wash - you have one bottle of CHG for all showers.  You should use approximately 1/3 of the bottle for each shower.   Please read over the following fact sheets that you were given: Pain Booklet, Coughing and Deep Breathing, Blood Transfusion Information and Surgical Site Infection Prevention

## 2012-11-21 NOTE — H&P (Signed)
Sarah Mcguire is an 72 y.o. female.   Chief Complaint: Left knee pain HPI: Patient followed by Dr. Renae Fickle and Dr. 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  The left knee today is the main issue.  Previous x-rays are shown bone-on-bone arthritic changes to the lateral compartment her most recent cortisone shot only lasted a couple of weeks and viscous supplementation that was given last fall has also worn off completely.  She has to use a walker for ambulation and even with that she has episodes of near falling.  The pain also wakes her at night.  She is here for consideration of either a knee arthroscopy or knee replacement.  The patient did have a DVT with a pulmonary embolus last fall and has been successfully treated with Coumadin with her most recent INR being 3.1.  Past Medical History  Diagnosis Date  . Hyperlipidemia   . Hypertension   . Hyperglycemia   . GERD (gastroesophageal reflux disease)   . Diabetes mellitus   . Anemia   . Leukocytosis   . Diverticulitis   . Neuropathy   . Anxiety     SEVERE  . PE (pulmonary embolism)   . DVT (deep venous thrombosis)     Past Surgical History  Procedure Laterality Date  . Tonsillectomy    . Tympanostomy tube placement      R ear    Family History  Problem Relation Age of Onset  . Diabetes Mother   . Hypertension Mother   . Heart disease Father     MI age 86's   . Diabetes Brother   . Hypertension Brother    Social History:  reports that she has been smoking Cigarettes.  She has been smoking about 0.80 packs per day. She has never used smokeless tobacco. She reports that she does not drink alcohol or use illicit drugs.  Allergies:  Allergies  Allergen Reactions  . Amoxicillin Other (See Comments)    Unknown   . Amoxicillin-Pot Clavulanate Other (See Comments)    Unknown   . Citalopram Hydrobromide Other (See Comments)    Itching   . Clonazepam Itching   . Cymbalta (Duloxetine Hcl) Other (See Comments)    Nausea, headache and diarrhea   . Macrobid (Nitrofurantoin Macrocrystal) Other (See Comments)    Nausea, vomitting  . Penicillins Other (See Comments)    Unknown   . Pyridium (Phenazopyridine Hcl) Other (See Comments)    headache  . Zonegran Other (See Comments)    Unknown   . Zonisamide Other (See Comments)    Unknown   . Sertraline Hcl Anxiety  . Wellbutrin (Bupropion) Itching and Rash    Itching, rash, hyper     No prescriptions prior to admission    No results found for this or any previous visit (from the past 48 hour(s)). No results found.  Review of Systems  Constitutional: Negative.   HENT: Negative.   Eyes: Negative.   Respiratory: Negative.   Cardiovascular: Negative.   Gastrointestinal: Negative.   Genitourinary: Negative.   Musculoskeletal: Positive for joint pain.  Skin: Negative.   Neurological: Negative.   Endo/Heme/Allergies: Bruises/bleeds easily.  Psychiatric/Behavioral: Negative.     There were no vitals taken for this visit. Physical Exam  Constitutional: She is oriented to person, place, and time. She appears well-developed and well-nourished.  HENT:  Head: Normocephalic.  Eyes: Pupils are equal, round, and reactive to light.  Cardiovascular: Normal heart  sounds.   Musculoskeletal:       Left knee: She exhibits decreased range of motion and effusion. Tenderness found. Lateral joint line tenderness noted.  Neurological: She is alert and oriented to person, place, and time.  Psychiatric: She has a normal mood and affect.     Assessment/Plan Assess: End-stage arthritis lateral compartment the left knee and a woman who had DVT with pulmonary embolus last September on Coumadin  Plan: Risks and benefits of arthroplasty were discussed at length I do not believe arthroscopy would help her based on the bone on bone changes.  Also, she has exhausted conservative measures including judicious use  of narcotics, and viscous supplementation and cortisone injections.  Like her to stop the Coumadin about 4 days prior to surgery and I will see her back at the time of surgical intervention.  I will see her back at the time of surgical intervention.  Fawn Desrocher M. 11/21/2012, 10:27 AM

## 2012-11-21 NOTE — Progress Notes (Signed)
Dr. Wadie Lessen office called for orders.

## 2012-11-23 MED ORDER — VANCOMYCIN HCL IN DEXTROSE 1-5 GM/200ML-% IV SOLN
1000.0000 mg | INTRAVENOUS | Status: DC
  Filled 2012-11-23: qty 200

## 2012-11-24 ENCOUNTER — Encounter (HOSPITAL_COMMUNITY)
Admission: RE | Disposition: A | Discharge status: Home Health | Payer: Self-pay | Source: Ambulatory Visit | Attending: Orthopedic Surgery

## 2012-11-24 ENCOUNTER — Encounter (HOSPITAL_COMMUNITY): Payer: Self-pay | Admitting: Vascular Surgery

## 2012-11-24 ENCOUNTER — Encounter (HOSPITAL_COMMUNITY): Payer: Self-pay | Admitting: *Deleted

## 2012-11-24 ENCOUNTER — Inpatient Hospital Stay (HOSPITAL_COMMUNITY): Payer: Medicare Other | Admitting: Anesthesiology

## 2012-11-24 ENCOUNTER — Inpatient Hospital Stay (HOSPITAL_COMMUNITY)
Admission: RE | Admit: 2012-11-24 | Discharge: 2012-11-27 | DRG: 470 | Disposition: A | Discharge status: Home Health | Payer: Medicare Other | Source: Ambulatory Visit | Attending: Orthopedic Surgery | Admitting: Orthopedic Surgery

## 2012-11-24 DIAGNOSIS — F17200 Nicotine dependence, unspecified, uncomplicated: Secondary | ICD-10-CM | POA: Diagnosis present

## 2012-11-24 DIAGNOSIS — M722 Plantar fascial fibromatosis: Secondary | ICD-10-CM | POA: Diagnosis present

## 2012-11-24 DIAGNOSIS — Z8249 Family history of ischemic heart disease and other diseases of the circulatory system: Secondary | ICD-10-CM

## 2012-11-24 DIAGNOSIS — Z86718 Personal history of other venous thrombosis and embolism: Secondary | ICD-10-CM

## 2012-11-24 DIAGNOSIS — Z01812 Encounter for preprocedural laboratory examination: Secondary | ICD-10-CM

## 2012-11-24 DIAGNOSIS — Z833 Family history of diabetes mellitus: Secondary | ICD-10-CM

## 2012-11-24 DIAGNOSIS — Z880 Allergy status to penicillin: Secondary | ICD-10-CM

## 2012-11-24 DIAGNOSIS — E119 Type 2 diabetes mellitus without complications: Secondary | ICD-10-CM | POA: Diagnosis present

## 2012-11-24 DIAGNOSIS — Z881 Allergy status to other antibiotic agents status: Secondary | ICD-10-CM

## 2012-11-24 DIAGNOSIS — Z79899 Other long term (current) drug therapy: Secondary | ICD-10-CM

## 2012-11-24 DIAGNOSIS — M1712 Unilateral primary osteoarthritis, left knee: Secondary | ICD-10-CM | POA: Diagnosis present

## 2012-11-24 DIAGNOSIS — M1710 Unilateral primary osteoarthritis, unspecified knee: Principal | ICD-10-CM | POA: Diagnosis present

## 2012-11-24 DIAGNOSIS — G609 Hereditary and idiopathic neuropathy, unspecified: Secondary | ICD-10-CM | POA: Diagnosis present

## 2012-11-24 DIAGNOSIS — E1140 Type 2 diabetes mellitus with diabetic neuropathy, unspecified: Secondary | ICD-10-CM | POA: Diagnosis present

## 2012-11-24 DIAGNOSIS — E785 Hyperlipidemia, unspecified: Secondary | ICD-10-CM | POA: Diagnosis present

## 2012-11-24 DIAGNOSIS — Z888 Allergy status to other drugs, medicaments and biological substances status: Secondary | ICD-10-CM

## 2012-11-24 DIAGNOSIS — K219 Gastro-esophageal reflux disease without esophagitis: Secondary | ICD-10-CM | POA: Diagnosis present

## 2012-11-24 DIAGNOSIS — Z86711 Personal history of pulmonary embolism: Secondary | ICD-10-CM

## 2012-11-24 DIAGNOSIS — Z7901 Long term (current) use of anticoagulants: Secondary | ICD-10-CM

## 2012-11-24 DIAGNOSIS — I10 Essential (primary) hypertension: Secondary | ICD-10-CM | POA: Diagnosis present

## 2012-11-24 DIAGNOSIS — F411 Generalized anxiety disorder: Secondary | ICD-10-CM | POA: Diagnosis present

## 2012-11-24 HISTORY — PX: TOTAL KNEE ARTHROPLASTY: SHX125

## 2012-11-24 LAB — PROTIME-INR
INR: 1.01 (ref 0.00–1.49)
Prothrombin Time: 13.2 seconds (ref 11.6–15.2)

## 2012-11-24 LAB — CREATININE, SERUM
Creatinine, Ser: 0.99 mg/dL (ref 0.50–1.10)
GFR calc Af Amer: 64 mL/min — ABNORMAL LOW
GFR calc non Af Amer: 56 mL/min — ABNORMAL LOW

## 2012-11-24 LAB — CBC
MCH: 28.8 pg (ref 26.0–34.0)
Platelets: 262 10*3/uL (ref 150–400)
RBC: 4.73 MIL/uL (ref 3.87–5.11)
RDW: 15.8 % — ABNORMAL HIGH (ref 11.5–15.5)
WBC: 22.7 10*3/uL — ABNORMAL HIGH (ref 4.0–10.5)

## 2012-11-24 LAB — GLUCOSE, CAPILLARY: Glucose-Capillary: 158 mg/dL — ABNORMAL HIGH (ref 70–99)

## 2012-11-24 SURGERY — ARTHROPLASTY, KNEE, TOTAL
Anesthesia: General | Site: Knee | Laterality: Left | Wound class: Clean

## 2012-11-24 MED ORDER — PANTOPRAZOLE SODIUM 40 MG PO TBEC
80.0000 mg | DELAYED_RELEASE_TABLET | Freq: Every day | ORAL | Status: DC
  Administered 2012-11-25 – 2012-11-27 (×3): 80 mg via ORAL
  Filled 2012-11-24 (×3): qty 2

## 2012-11-24 MED ORDER — ONDANSETRON HCL 4 MG/2ML IJ SOLN
INTRAMUSCULAR | Status: DC | PRN
  Administered 2012-11-24: 4 mg via INTRAVENOUS

## 2012-11-24 MED ORDER — HYDROMORPHONE HCL PF 1 MG/ML IJ SOLN: 1.0000 mg | INTRAMUSCULAR | Status: DC | PRN

## 2012-11-24 MED ORDER — DEXTROSE-NACL 5-0.45 % IV SOLN: INTRAVENOUS | Status: DC

## 2012-11-24 MED ORDER — ROCURONIUM BROMIDE 100 MG/10ML IV SOLN
INTRAVENOUS | Status: DC | PRN
  Administered 2012-11-24: 30 mg via INTRAVENOUS

## 2012-11-24 MED ORDER — VANCOMYCIN HCL 1000 MG IV SOLR
1000.0000 mg | INTRAVENOUS | Status: DC | PRN
  Administered 2012-11-24: 1000 mg via INTRAVENOUS

## 2012-11-24 MED ORDER — PHENYLEPHRINE HCL 10 MG/ML IJ SOLN
INTRAMUSCULAR | Status: DC | PRN
  Administered 2012-11-24 (×2): 80 ug via INTRAVENOUS
  Administered 2012-11-24: 120 ug via INTRAVENOUS

## 2012-11-24 MED ORDER — FENTANYL CITRATE 0.05 MG/ML IJ SOLN
INTRAMUSCULAR | Status: AC
  Filled 2012-11-24: qty 2

## 2012-11-24 MED ORDER — DIPHENHYDRAMINE HCL 12.5 MG/5ML PO ELIX: 12.5000 mg | ORAL_SOLUTION | ORAL | Status: DC | PRN

## 2012-11-24 MED ORDER — SODIUM CHLORIDE 0.9 % IR SOLN
Status: DC | PRN
  Administered 2012-11-24: 3000 mL

## 2012-11-24 MED ORDER — MIDAZOLAM HCL 2 MG/2ML IJ SOLN
INTRAMUSCULAR | Status: AC
  Filled 2012-11-24: qty 2

## 2012-11-24 MED ORDER — SUCCINYLCHOLINE CHLORIDE 20 MG/ML IJ SOLN
INTRAMUSCULAR | Status: DC | PRN
  Administered 2012-11-24: 100 mg via INTRAVENOUS

## 2012-11-24 MED ORDER — LIDOCAINE HCL (CARDIAC) 20 MG/ML IV SOLN
INTRAVENOUS | Status: DC | PRN
  Administered 2012-11-24: 80 mg via INTRAVENOUS

## 2012-11-24 MED ORDER — FENOFIBRATE 160 MG PO TABS
160.0000 mg | ORAL_TABLET | Freq: Every day | ORAL | Status: DC
Start: 2012-11-24 — End: 2012-11-27
  Administered 2012-11-24 – 2012-11-26 (×3): 160 mg via ORAL
  Filled 2012-11-24 (×4): qty 1

## 2012-11-24 MED ORDER — AMLODIPINE BESYLATE 5 MG PO TABS
5.0000 mg | ORAL_TABLET | Freq: Every day | ORAL | Status: DC
  Administered 2012-11-25 – 2012-11-27 (×2): 5 mg via ORAL
  Filled 2012-11-24 (×3): qty 1

## 2012-11-24 MED ORDER — FOLIC ACID 1 MG PO TABS
1.0000 mg | ORAL_TABLET | Freq: Every day | ORAL | Status: DC
  Administered 2012-11-24 – 2012-11-27 (×4): 1 mg via ORAL
  Filled 2012-11-24 (×4): qty 1

## 2012-11-24 MED ORDER — KCL IN DEXTROSE-NACL 20-5-0.45 MEQ/L-%-% IV SOLN
INTRAVENOUS | Status: DC
  Administered 2012-11-24 – 2012-11-25 (×2): via INTRAVENOUS
  Filled 2012-11-24 (×11): qty 1000

## 2012-11-24 MED ORDER — METOCLOPRAMIDE HCL 5 MG/ML IJ SOLN: 5.0000 mg | Freq: Three times a day (TID) | INTRAMUSCULAR | Status: DC | PRN

## 2012-11-24 MED ORDER — PROPOFOL 10 MG/ML IV BOLUS
INTRAVENOUS | Status: DC | PRN
  Administered 2012-11-24: 200 mg via INTRAVENOUS

## 2012-11-24 MED ORDER — CEFUROXIME SODIUM 1.5 G IJ SOLR
INTRAMUSCULAR | Status: DC | PRN
  Administered 2012-11-24: 1.5 g

## 2012-11-24 MED ORDER — MAGNESIUM HYDROXIDE 400 MG/5ML PO SUSP: 30.0000 mL | Freq: Every day | ORAL | Status: DC | PRN

## 2012-11-24 MED ORDER — VITAMIN D 1000 UNITS PO TABS
2000.0000 [IU] | ORAL_TABLET | Freq: Every day | ORAL | Status: DC
  Administered 2012-11-24 – 2012-11-27 (×4): 2000 [IU] via ORAL
  Filled 2012-11-24 (×4): qty 2

## 2012-11-24 MED ORDER — OXYCODONE HCL 5 MG PO TABS
5.0000 mg | ORAL_TABLET | ORAL | Status: DC | PRN
Start: 2012-11-24 — End: 2012-11-26
  Administered 2012-11-24 – 2012-11-25 (×2): 5 mg via ORAL
  Administered 2012-11-25 – 2012-11-26 (×6): 10 mg via ORAL
  Filled 2012-11-24 (×2): qty 1
  Filled 2012-11-24 (×6): qty 2

## 2012-11-24 MED ORDER — FENTANYL CITRATE 0.05 MG/ML IJ SOLN
INTRAMUSCULAR | Status: DC | PRN
  Administered 2012-11-24: 50 ug via INTRAVENOUS
  Administered 2012-11-24: 75 ug via INTRAVENOUS
  Administered 2012-11-24: 25 ug via INTRAVENOUS

## 2012-11-24 MED ORDER — WARFARIN SODIUM 5 MG PO TABS
5.0000 mg | ORAL_TABLET | ORAL | Status: DC
  Administered 2012-11-25: 5 mg via ORAL
  Filled 2012-11-24 (×4): qty 1

## 2012-11-24 MED ORDER — BISACODYL 5 MG PO TBEC: 5.0000 mg | DELAYED_RELEASE_TABLET | Freq: Every day | ORAL | Status: DC | PRN

## 2012-11-24 MED ORDER — FENTANYL CITRATE 0.05 MG/ML IJ SOLN
25.0000 ug | INTRAMUSCULAR | Status: DC | PRN
  Administered 2012-11-24 (×2): 50 ug via INTRAVENOUS

## 2012-11-24 MED ORDER — NEOSTIGMINE METHYLSULFATE 1 MG/ML IJ SOLN
INTRAMUSCULAR | Status: DC | PRN
  Administered 2012-11-24: 3 mg via INTRAVENOUS

## 2012-11-24 MED ORDER — KCL IN DEXTROSE-NACL 20-5-0.45 MEQ/L-%-% IV SOLN
INTRAVENOUS | Status: AC
  Filled 2012-11-24: qty 1000

## 2012-11-24 MED ORDER — ALUM & MAG HYDROXIDE-SIMETH 200-200-20 MG/5ML PO SUSP: 30.0000 mL | ORAL | Status: DC | PRN

## 2012-11-24 MED ORDER — PHENOL 1.4 % MT LIQD: 1.0000 | OROMUCOSAL | Status: DC | PRN

## 2012-11-24 MED ORDER — CEFUROXIME SODIUM 1.5 G IJ SOLR
INTRAMUSCULAR | Status: AC
  Filled 2012-11-24: qty 1.5

## 2012-11-24 MED ORDER — ENOXAPARIN SODIUM 30 MG/0.3ML ~~LOC~~ SOLN
30.0000 mg | Freq: Two times a day (BID) | SUBCUTANEOUS | Status: DC
  Administered 2012-11-25 – 2012-11-27 (×5): 30 mg via SUBCUTANEOUS
  Filled 2012-11-24 (×7): qty 0.3

## 2012-11-24 MED ORDER — ALPRAZOLAM 0.5 MG PO TABS
1.0000 mg | ORAL_TABLET | Freq: Three times a day (TID) | ORAL | Status: DC | PRN
  Administered 2012-11-24: 1.5 mg via ORAL
  Filled 2012-11-24: qty 3

## 2012-11-24 MED ORDER — ACETAMINOPHEN 10 MG/ML IV SOLN
1000.0000 mg | Freq: Four times a day (QID) | INTRAVENOUS | Status: AC
  Administered 2012-11-24 – 2012-11-25 (×3): 1000 mg via INTRAVENOUS
  Filled 2012-11-24 (×5): qty 100

## 2012-11-24 MED ORDER — METOCLOPRAMIDE HCL 10 MG PO TABS: 5.0000 mg | ORAL_TABLET | Freq: Three times a day (TID) | ORAL | Status: DC | PRN

## 2012-11-24 MED ORDER — GLYCOPYRROLATE 0.2 MG/ML IJ SOLN
INTRAMUSCULAR | Status: DC | PRN
  Administered 2012-11-24: 0.4 mg via INTRAVENOUS

## 2012-11-24 MED ORDER — BENAZEPRIL HCL 20 MG PO TABS
20.0000 mg | ORAL_TABLET | Freq: Every day | ORAL | Status: DC
  Administered 2012-11-24 – 2012-11-25 (×2): 20 mg via ORAL
  Filled 2012-11-24 (×3): qty 1

## 2012-11-24 MED ORDER — SIMVASTATIN 20 MG PO TABS
20.0000 mg | ORAL_TABLET | Freq: Every day | ORAL | Status: DC
Start: 2012-11-24 — End: 2012-11-27
  Administered 2012-11-24 – 2012-11-26 (×3): 20 mg via ORAL
  Filled 2012-11-24 (×4): qty 1

## 2012-11-24 MED ORDER — ZOLPIDEM TARTRATE 5 MG PO TABS: 5.0000 mg | ORAL_TABLET | Freq: Every evening | ORAL | Status: DC | PRN

## 2012-11-24 MED ORDER — WARFARIN SODIUM 2.5 MG PO TABS
2.5000 mg | ORAL_TABLET | ORAL | Status: DC
  Administered 2012-11-26: 2.5 mg via ORAL
  Filled 2012-11-24: qty 1

## 2012-11-24 MED ORDER — FLEET ENEMA 7-19 GM/118ML RE ENEM: 1.0000 | ENEMA | Freq: Once | RECTAL | Status: AC | PRN

## 2012-11-24 MED ORDER — LACTATED RINGERS IV SOLN
INTRAVENOUS | Status: DC
Start: 2012-11-24 — End: 2012-11-24
  Administered 2012-11-24 (×2): via INTRAVENOUS

## 2012-11-24 MED ORDER — ONDANSETRON HCL 4 MG/2ML IJ SOLN: 4.0000 mg | Freq: Four times a day (QID) | INTRAMUSCULAR | Status: DC | PRN

## 2012-11-24 MED ORDER — WARFARIN - PHYSICIAN DOSING INPATIENT: Freq: Every day | Status: DC

## 2012-11-24 MED ORDER — METHOCARBAMOL 500 MG PO TABS
500.0000 mg | ORAL_TABLET | Freq: Four times a day (QID) | ORAL | Status: DC | PRN
  Administered 2012-11-24 – 2012-11-25 (×4): 500 mg via ORAL
  Filled 2012-11-24 (×4): qty 1

## 2012-11-24 MED ORDER — METHOCARBAMOL 100 MG/ML IJ SOLN
500.0000 mg | Freq: Four times a day (QID) | INTRAVENOUS | Status: DC | PRN
  Filled 2012-11-24: qty 5

## 2012-11-24 MED ORDER — WARFARIN SODIUM 5 MG PO TABS: 5.0000 mg | ORAL_TABLET | ORAL | Status: DC

## 2012-11-24 MED ORDER — ACETAMINOPHEN 325 MG PO TABS: 650.0000 mg | ORAL_TABLET | Freq: Four times a day (QID) | ORAL | Status: DC | PRN

## 2012-11-24 MED ORDER — ONDANSETRON HCL 4 MG PO TABS: 4.0000 mg | ORAL_TABLET | Freq: Four times a day (QID) | ORAL | Status: DC | PRN

## 2012-11-24 MED ORDER — MENTHOL 3 MG MT LOZG: 1.0000 | LOZENGE | OROMUCOSAL | Status: DC | PRN

## 2012-11-24 MED ORDER — ACETAMINOPHEN 650 MG RE SUPP: 650.0000 mg | Freq: Four times a day (QID) | RECTAL | Status: DC | PRN

## 2012-11-24 MED ORDER — CHLORHEXIDINE GLUCONATE 4 % EX LIQD: 60.0000 mL | Freq: Once | CUTANEOUS | Status: DC

## 2012-11-24 SURGICAL SUPPLY — 56 items
BANDAGE ELASTIC 6 VELCRO ST LF (GAUZE/BANDAGES/DRESSINGS) ×1 IMPLANT
BANDAGE ESMARK 6X9 LF (GAUZE/BANDAGES/DRESSINGS) ×1 IMPLANT
BLADE SAG 18X100X1.27 (BLADE) ×2 IMPLANT
BLADE SAW SGTL 13X75X1.27 (BLADE) ×2 IMPLANT
BLADE SURG ROTATE 9660 (MISCELLANEOUS) IMPLANT
BNDG CMPR 9X6 STRL LF SNTH (GAUZE/BANDAGES/DRESSINGS) ×1
BNDG CMPR MED 10X6 ELC LF (GAUZE/BANDAGES/DRESSINGS) ×1
BNDG ELASTIC 6X10 VLCR STRL LF (GAUZE/BANDAGES/DRESSINGS) ×2 IMPLANT
BNDG ESMARK 6X9 LF (GAUZE/BANDAGES/DRESSINGS) ×2
BOWL SMART MIX CTS (DISPOSABLE) ×2 IMPLANT
CEMENT HV SMART SET (Cement) ×4 IMPLANT
CLOTH BEACON ORANGE TIMEOUT ST (SAFETY) ×2 IMPLANT
CO AXIAL FAN SPRAY TIP SOFT SH (MISCELLANEOUS) ×1 IMPLANT
COVER SURGICAL LIGHT HANDLE (MISCELLANEOUS) ×2 IMPLANT
CUFF TOURNIQUET SINGLE 34IN LL (TOURNIQUET CUFF) IMPLANT
CUFF TOURNIQUET SINGLE 44IN (TOURNIQUET CUFF) IMPLANT
DRAPE EXTREMITY T 121X128X90 (DRAPE) ×2 IMPLANT
DRAPE U-SHAPE 47X51 STRL (DRAPES) ×2 IMPLANT
DRSG PAD ABDOMINAL 8X10 ST (GAUZE/BANDAGES/DRESSINGS) ×1 IMPLANT
DURAPREP 26ML APPLICATOR (WOUND CARE) ×2 IMPLANT
ELECT REM PT RETURN 9FT ADLT (ELECTROSURGICAL) ×2
ELECTRODE REM PT RTRN 9FT ADLT (ELECTROSURGICAL) ×1 IMPLANT
EVACUATOR 1/8 PVC DRAIN (DRAIN) ×2 IMPLANT
GAUZE XEROFORM 1X8 LF (GAUZE/BANDAGES/DRESSINGS) ×2 IMPLANT
GLOVE BIO SURGEON STRL SZ 6.5 (GLOVE) ×4 IMPLANT
GLOVE BIO SURGEON STRL SZ7 (GLOVE) ×3 IMPLANT
GLOVE BIO SURGEON STRL SZ7.5 (GLOVE) ×2 IMPLANT
GLOVE BIOGEL PI IND STRL 7.0 (GLOVE) ×1 IMPLANT
GLOVE BIOGEL PI IND STRL 8 (GLOVE) ×1 IMPLANT
GLOVE BIOGEL PI INDICATOR 7.0 (GLOVE) ×1
GLOVE BIOGEL PI INDICATOR 8 (GLOVE) ×1
GOWN PREVENTION PLUS XLARGE (GOWN DISPOSABLE) ×2 IMPLANT
GOWN STRL NON-REIN LRG LVL3 (GOWN DISPOSABLE) ×5 IMPLANT
HANDPIECE INTERPULSE COAX TIP (DISPOSABLE) ×2
HOOD PEEL AWAY FACE SHEILD DIS (HOOD) ×6 IMPLANT
KIT BASIN OR (CUSTOM PROCEDURE TRAY) ×2 IMPLANT
KIT ROOM TURNOVER OR (KITS) ×2 IMPLANT
MANIFOLD NEPTUNE II (INSTRUMENTS) ×2 IMPLANT
NS IRRIG 1000ML POUR BTL (IV SOLUTION) ×2 IMPLANT
PACK TOTAL JOINT (CUSTOM PROCEDURE TRAY) ×2 IMPLANT
PAD ARMBOARD 7.5X6 YLW CONV (MISCELLANEOUS) ×4 IMPLANT
PADDING CAST COTTON 6X4 STRL (CAST SUPPLIES) ×2 IMPLANT
SET HNDPC FAN SPRY TIP SCT (DISPOSABLE) ×1 IMPLANT
SPONGE GAUZE 4X4 12PLY (GAUZE/BANDAGES/DRESSINGS) ×3 IMPLANT
STAPLER VISISTAT 35W (STAPLE) ×2 IMPLANT
SUCTION FRAZIER TIP 10 FR DISP (SUCTIONS) ×2 IMPLANT
SUT VIC AB 0 CTX 36 (SUTURE) ×2
SUT VIC AB 0 CTX36XBRD ANTBCTR (SUTURE) ×1 IMPLANT
SUT VIC AB 1 CTX 36 (SUTURE) ×2
SUT VIC AB 1 CTX36XBRD ANBCTR (SUTURE) ×1 IMPLANT
SUT VIC AB 2-0 CT1 27 (SUTURE) ×2
SUT VIC AB 2-0 CT1 TAPERPNT 27 (SUTURE) ×1 IMPLANT
TOWEL OR 17X24 6PK STRL BLUE (TOWEL DISPOSABLE) ×2 IMPLANT
TOWEL OR 17X26 10 PK STRL BLUE (TOWEL DISPOSABLE) ×2 IMPLANT
TRAY FOLEY CATH 14FR (SET/KITS/TRAYS/PACK) ×1 IMPLANT
WATER STERILE IRR 1000ML POUR (IV SOLUTION) ×4 IMPLANT

## 2012-11-24 NOTE — Anesthesia Preprocedure Evaluation (Addendum)
Anesthesia Evaluation  Patient identified by MRN, date of birth, ID band Patient awake    Reviewed: Allergy & Precautions, H&P , NPO status , Patient's Chart, lab work & pertinent test results  Airway Mallampati: II      Dental   Pulmonary  History of PE.         Cardiovascular hypertension, +CHF     Neuro/Psych Anxiety    GI/Hepatic GERD-  ,  Endo/Other  diabetes  Renal/GU      Musculoskeletal   Abdominal   Peds  Hematology   Anesthesia Other Findings   Reproductive/Obstetrics                           Anesthesia Physical Anesthesia Plan  ASA: III  Anesthesia Plan: General   Post-op Pain Management:    Induction: Intravenous  Airway Management Planned: Oral ETT  Additional Equipment:   Intra-op Plan:   Post-operative Plan: Extubation in OR  Informed Consent: I have reviewed the patients History and Physical, chart, labs and discussed the procedure including the risks, benefits and alternatives for the proposed anesthesia with the patient or authorized representative who has indicated his/her understanding and acceptance.   Dental advisory given  Plan Discussed with: CRNA, Anesthesiologist and Surgeon  Anesthesia Plan Comments:         Anesthesia Quick Evaluation

## 2012-11-24 NOTE — Progress Notes (Signed)
Orthopedic Tech Progress Note Patient Details:  Sarah Mcguire 05-21-1941 [id number] Called to check on patient complaining of discomfort in CPM. CPM adjusted with extra padding and repositioning of leg. Patient is placed anatomically correct in CPM machine. Any further adjustments for patient discomfort can be made by lowering degree settings on machine. Nurse notified.  Patient ID: Sarah Mcguire, female   DOB: 1940/12/26, 72 y.o.   MRN: [id number]   Orie Rout 11/24/2012, 2:45 PM

## 2012-11-24 NOTE — Preoperative (Signed)
Beta Blockers   Reason not to administer Beta Blockers:Not Applicable 

## 2012-11-24 NOTE — Anesthesia Postprocedure Evaluation (Signed)
  Anesthesia Post-op Note  Patient: Sarah Mcguire  Procedure(s) Performed: Procedure(s): TOTAL KNEE ARTHROPLASTY (Left)  Patient Location: PACU  Anesthesia Type:General  Level of Consciousness: awake  Airway and Oxygen Therapy: Patient Spontanous Breathing  Post-op Pain: mild  Post-op Assessment: Post-op Vital signs reviewed  Post-op Vital Signs: Reviewed  Complications: No apparent anesthesia complications

## 2012-11-24 NOTE — Transfer of Care (Signed)
Immediate Anesthesia Transfer of Care Note  Patient: Sarah Mcguire  Procedure(s) Performed: Procedure(s): TOTAL KNEE ARTHROPLASTY (Left)  Patient Location: PACU  Anesthesia Type:GA combined with regional for post-op pain  Level of Consciousness: awake and alert   Airway & Oxygen Therapy: Patient Spontanous Breathing and Patient connected to face mask oxygen  Post-op Assessment: Report given to PACU RN, Post -op Vital signs reviewed and stable and Patient moving all extremities  Post vital signs: Reviewed and stable  Complications: No apparent anesthesia complications

## 2012-11-24 NOTE — Evaluation (Signed)
Physical Therapy Evaluation Patient Details Name: Sarah Mcguire MRN: [id number] DOB: [birth year] Today's Date: 11/24/2012 Time: 4098-1191 PT Time Calculation (min): 25 min  PT Assessment / Plan / Recommendation Clinical Impression  Patient is a 72 yo female s/p Lt. TKA.  Patient with weakness, pain, and lethargy impacting functional mobility.  Will benefit from acute PT to maximize independence prior to return home.  Recommend HHPT at discharge.    PT Assessment  Patient needs continued PT services    Follow Up Recommendations  Home health PT;Supervision/Assistance - 24 hour    Does the patient have the potential to tolerate intense rehabilitation      Barriers to Discharge        Equipment Recommendations  None recommended by PT    Recommendations for Other Services     Frequency 7X/week    Precautions / Restrictions Precautions Precautions: Knee Precaution Booklet Issued: Yes (comment) Precaution Comments: Provided education materials. Restrictions Weight Bearing Restrictions: Yes LLE Weight Bearing: Weight bearing as tolerated   Pertinent Vitals/Pain       Mobility  Bed Mobility Bed Mobility: Supine to Sit;Sitting - Scoot to Edge of Bed Supine to Sit: 2: Max assist;With rails;HOB elevated Sitting - Scoot to [place] of Bed: 2: Max assist Details for Bed Mobility Assistance: Verbal and tactile cues for technique.  Assist to move LLE off of bed, and to raise trunk to sitting.  Used bed pad to assist patient to move to EOB.  Sat at EOB 5 minutes with flexed posture.  Able to maintain own balance. Transfers Transfers: Sit to Stand;Stand to Sit;Stand Pivot Transfers Sit to Stand: 1: +2 Total assist;With upper extremity assist;From bed Sit to Stand: Patient Percentage: 60% Stand to Sit: 1: +2 Total assist;With upper extremity assist;To chair/3-in-1 Stand to Sit: Patient Percentage: 50% Stand Pivot Transfers: 1: +2 Total assist Stand Pivot Transfers: Patient Percentage:  60% Details for Transfer Assistance: Verbal and tactile cues for hand placement and technique.  Assist to rise to standing.  Patient stood x 2 minutes initially with posterior lean.  Able to maintain balance with RW, and take several steps to pivot to chair.  Patient began sitting while still holding on to RW, though verbal cues were to reach back to chair.  Assist provided to control descent to chair.  Patient able to scoot back into chair using armrests with min guard assist only. Ambulation/Gait Ambulation/Gait Assistance: Not tested (comment)    Exercises     PT Diagnosis: Difficulty walking;Abnormality of gait;Acute pain  PT Problem List: Decreased strength;Decreased activity tolerance;Decreased range of motion;Decreased balance;Decreased mobility;Decreased knowledge of use of DME;Decreased knowledge of precautions;Pain PT Treatment Interventions: DME instruction;Gait training;Stair training;Functional mobility training;Therapeutic exercise;Patient/family education   PT Goals Acute Rehab PT Goals PT Goal Formulation: With patient/family Time For Goal Achievement: 12/01/12 Potential to Achieve Goals: Good Pt will go Supine/Side to Sit: Independently;with HOB 0 degrees PT Goal: Supine/Side to Sit - Progress: Goal set today Pt will go Sit to Supine/Side: Independently;with HOB 0 degrees PT Goal: Sit to Supine/Side - Progress: Goal set today Pt will go Sit to Stand: with supervision;with upper extremity assist PT Goal: Sit to Stand - Progress: Goal set today Pt will go Stand to Sit: with supervision;with upper extremity assist PT Goal: Stand to Sit - Progress: Goal set today Pt will Ambulate: 51 - 150 feet;with supervision;with rolling walker PT Goal: Ambulate - Progress: Goal set today Pt will Go Up / Down Stairs: 1-2 stairs;with min assist;with least restrictive  assistive device PT Goal: Up/Down Stairs - Progress: Goal set today Pt will Perform Home Exercise Program: with supervision,  verbal cues required/provided PT Goal: Perform Home Exercise Program - Progress: Goal set today  Visit Information  Last PT Received On: 11/24/12 Assistance Needed: +2    Subjective Data  Subjective: Patient sleepy.  Opens eyes when asked.  "I'm cold" Patient Stated Goal: "I want to go home"   Prior Functioning  Home Living Lives With: Spouse Available Help at Discharge: Family Type of Home: House ([place]) Home Access: Stairs to enter Secretary/administrator of Steps: 1 Entrance Stairs-Rails: None Home Layout: One level Bathroom Shower/Tub: Forensic scientist: Standard Bathroom Accessibility: Yes How Accessible: Accessible via walker Home Adaptive Equipment: Walker - rolling Prior Function Level of Independence: Independent with assistive device(s);Needs assistance (Uses RW) Needs Assistance: Light Housekeeping;Meal Prep Meal Prep: Maximal Light Housekeeping: Maximal Able to Take Stairs?: Yes (with assistance) 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)    Cognition  Cognition Overall Cognitive Status: Appears within functional limits for tasks assessed/performed Arousal/Alertness: Lethargic Orientation Level: Oriented X4 / Intact Behavior During Session: Lethargic    Extremity/Trunk Assessment Right Upper Extremity Assessment RUE ROM/Strength/Tone: WFL for tasks assessed RUE Sensation: WFL - Light Touch Left Upper Extremity Assessment LUE ROM/Strength/Tone: WFL for tasks assessed LUE Sensation: WFL - Light Touch Right Lower Extremity Assessment RLE ROM/Strength/Tone: Deficits RLE ROM/Strength/Tone Deficits: General weakness - 4/5 RLE Sensation: WFL - Light Touch Left Lower Extremity Assessment LLE ROM/Strength/Tone: Deficits;Unable to fully assess;Due to pain LLE ROM/Strength/Tone Deficits: Unable to lift LE off of bed   Balance Balance Balance Assessed: Yes Static Sitting Balance Static Sitting - Balance  Support: Bilateral upper extremity supported;Feet supported Static Sitting - Level of Assistance: 5: Stand by assistance Static Sitting - Comment/# of Minutes: 5 Static Standing Balance Static Standing - Balance Support: Bilateral upper extremity supported Static Standing - Level of Assistance: 4: Min assist Static Standing - Comment/# of Minutes: 2 minutes.  Initially patient with posterior lean.  Over time, able to maintain balance with min assist.  End of Session PT - End of Session Equipment Utilized During Treatment: Gait belt;Oxygen Activity Tolerance: Patient limited by fatigue;Patient limited by pain;Treatment limited secondary to medical complications (Comment) (Lethargy) Patient left: in chair;with call bell/phone within reach;with family/visitor present;with nursing in room Nurse Communication: Mobility status CPM Left Knee CPM Left Knee: Off  GP     Vena Austria 11/24/2012, 4:24 PM Durenda Hurt. Renaldo Fiddler, [facility] Acute Rehab Services Pager [phone]

## 2012-11-24 NOTE — Interval H&P Note (Signed)
History and Physical Interval Note:  11/24/2012 9:08 AM  Sarah Mcguire  has presented today for surgery, with the diagnosis of OA LEFT KNEE  The various methods of treatment have been discussed with the patient and family. After consideration of risks, benefits and other options for treatment, the patient has consented to  Procedure(s): TOTAL KNEE ARTHROPLASTY (Left) as a surgical intervention .  The patient's history has been reviewed, patient examined, no change in status, stable for surgery.  I have reviewed the patient's chart and labs.  Questions were answered to the patient's satisfaction.     [name]

## 2012-11-24 NOTE — Anesthesia Procedure Notes (Addendum)
Procedure Name: Intubation Date/Time: 11/24/2012 9:27 AM Performed by: Jerilee Hoh Pre-anesthesia Checklist: Patient identified, Emergency Drugs available, Suction available and Patient being monitored Patient Re-evaluated:Patient Re-evaluated prior to inductionOxygen Delivery Method: Circle system utilized Preoxygenation: Pre-oxygenation with 100% oxygen Intubation Type: IV induction Ventilation: Mask ventilation without difficulty Grade View: Grade II Tube type: Oral Tube size: 7.5 mm Number of attempts: 1 Airway Equipment and Method: Video-laryngoscopy and Rigid stylet Placement Confirmation: ETT inserted through vocal cords under direct vision,  positive ETCO2 and breath sounds checked- equal and bilateral Secured at: 22 cm Tube secured with: Tape Dental Injury: Teeth and Oropharynx as per pre-operative assessment  Difficulty Due To: Difficulty was anticipated, Difficult Airway- due to anterior larynx and Difficult Airway- due to limited oral opening Comments: Glidescope used d/t anticipated difficult airway.  Easy mask airway.  Grade II view with Glidescope.  Atraumatic oral intubation.    Anesthesia Regional Block:  Femoral nerve block  Pre-Anesthetic Checklist: ,, timeout performed, Correct Patient, Correct Site, Correct Laterality, Correct Procedure, Correct Position, site marked, Risks and benefits discussed,  Surgical consent,  Pre-op evaluation,  At surgeon's request and post-op pain management  Laterality: Left  Prep: chloraprep       Needles:  Injection technique: Single-shot  Needle Type: Echogenic Needle          Additional Needles:  Procedures: Doppler guided and nerve stimulator Femoral nerve block Narrative:  Start time: 11/24/2012 8:35 AM End time: 11/24/2012 8:50 AM Injection made incrementally with aspirations every 5 mL.  Performed by: Personally  Anesthesiologist: Dr. Randa Evens  Femoral nerve block

## 2012-11-24 NOTE — Op Note (Signed)
PATIENT ID:      Sarah Mcguire  MRN:     [id number] DOB/AGE:    [birth year] / 72 y.o.       OPERATIVE REPORT    DATE OF PROCEDURE:  11/24/2012       PREOPERATIVE DIAGNOSIS:   OA LEFT KNEE      Estimated body mass index is 34.20 kg/(m^2) as calculated from the following:   Height as of 11/21/12: 5\' 3"  (1.6 m).   Weight as of 11/10/12: 87.544 kg (193 lb).                                                        POSTOPERATIVE DIAGNOSIS:   OA LEFT KNEE                                                                      PROCEDURE:  Procedure(s): TOTAL KNEE ARTHROPLASTY Using Depuy Sigma RP implants #3L Femur, #3Tibia, 10mm sigma RP bearing, 35 Patella     SURGEON: Azreal Stthomas J    ASSISTANT:   Shirl Harris PA-C   (Present and scrubbed throughout the case, critical for assistance with exposure, retraction, instrumentation, and closure.)         ANESTHESIA: GET with Femoral Nerve Block  DRAINS: foley, 2 medium hemovac in knee   TOURNIQUET TIME:   COMPLICATIONS:  None     SPECIMENS: None   INDICATIONS FOR PROCEDURE: The patient has  OA LEFT KNEE, varus deformities, XR shows bone on bone arthritis. Patient has failed all conservative measures including anti-inflammatory medicines, narcotics, attempts at  exercise and weight loss, cortisone injections and viscosupplementation.  Risks and benefits of surgery have been discussed, questions answered.   DESCRIPTION OF PROCEDURE: The patient identified by armband, received  right femoral nerve block and IV antibiotics, in the holding area at [facility]. Patient taken to the operating room, appropriate anesthetic  monitors were attached General endotracheal anesthesia induced with  the patient in supine position, Foley catheter was inserted. Tourniquet  applied high to the operative thigh. Lateral post and foot positioner  applied to the table, the lower extremity was then prepped and draped  in usual sterile fashion from the  ankle to the tourniquet. Time-out procedure was performed. The limb was wrapped with an Esmarch bandage and the tourniquet inflated to 350 mmHg. We began the operation by making the anterior midline incision starting at handbreadth above the patella going over the patella 1 cm medial to and  4 cm distal to the tibial tubercle. Small bleeders in the skin and the  subcutaneous tissue identified and cauterized. Transverse retinaculum was incised and reflected medially and a medial parapatellar arthrotomy was accomplished. the patella was everted and theprepatellar fat pad resected. The superficial medial collateral  ligament was then elevated from anterior to posterior along the proximal  flare of the tibia and anterior half of the menisci resected. The knee was hyperflexed exposing bone on bone arthritis. Peripheral and notch osteophytes as well as the cruciate ligaments were then resected. We continued to  work  our way around posteriorly along the proximal tibia, and externally  rotated the tibia subluxing it out from underneath the femur. A McHale  retractor was placed through the notch and a lateral Hohmann retractor  placed, and we then drilled through the proximal tibia in line with the  axis of the tibia followed by an intramedullary guide rod and 2-degree  posterior slope cutting guide. The tibial cutting guide was pinned into place  allowing resection of 8 mm of bone medially and about 8 mm of bone  laterally because of her varus deformity. Satisfied with the tibial resection, we then  entered the distal femur 2 mm anterior to the PCL origin with the  intramedullary guide rod and applied the distal femoral cutting guide  set at 11mm, with 5 degrees of valgus. This was pinned along the  epicondylar axis. At this point, the distal femoral cut was accomplished without difficulty. We then sized for a #3L femoral component and pinned the guide in 3 degrees of external rotation.The chamfer cutting  guide was pinned into place. The anterior, posterior, and chamfer cuts were accomplished without difficulty followed by  the Sigma RP box cutting guide and the box cut. We also removed posterior osteophytes from the posterior femoral condyles. At this  time, the knee was brought into full extension. We checked our  extension and flexion gaps and found them symmetric at 10mm.  The patella thickness measured at 23 mm. We set the cutting guide at 14 and removed the posterior 9 mm  of the patella sized for 35 button and drilled the lollipop. The knee  was then once again hyperflexed exposing the proximal tibia. We sized for a #3 MBT tibial base plate, as the bone was soft, applied the smokestack and the conical reamer followed by the the Delta fin keel punch. We then hammered into place the Sigma RP trial femoral component, inserted a 10-mm trial bearing, trial patellar button, and took the knee through range of motion from 0-130 degrees. No thumb pressure was required for patellar  tracking. At this point, all trial components were removed, a double batch of DePuy HV cement with 1500 mg of Zinacef was mixed and applied to all bony metallic mating surfaces except for the posterior condyles of the femur itself. In order, we  hammered into place the tibial tray and removed excess cement, the femoral component and removed excess cement, a 10-mm Sigma RP bearing  was inserted, and the knee brought to full extension with compression.  The patellar button was clamped into place, and excess cement  removed. While the cement cured the wound was irrigated out with normal saline solution pulse lavage, and medium Hemovac drains were placed from an anterolateral  approach. Ligament stability and patellar tracking were checked and found to be excellent. The parapatellar arthrotomy was closed with  running #1 Vicryl suture. The subcutaneous tissue with 0 and 2-0 undyed  Vicryl suture, and the skin with skin staples. A  dressing of Xeroform,  4 x 4, dressing sponges, Webril, and Ace wrap applied. The patient  awakened, extubated, and taken to recovery room without difficulty.   Phinley Schall J 11/24/2012, 10:53 AM

## 2012-11-24 NOTE — Progress Notes (Signed)
Orthopedic Tech Progress Note Patient Details:  Sarah Mcguire Mar 27, 1941 [id number] CPM applied to Left LE with appropriate settings. OHF applied to bed.  CPM Left Knee CPM Left Knee: On Left Knee Flexion (Degrees): 60 Left Knee Extension (Degrees): 0   Asia R Thompson 11/24/2012, 1:30 PM

## 2012-11-25 ENCOUNTER — Encounter (HOSPITAL_COMMUNITY): Payer: Self-pay | Admitting: Orthopedic Surgery

## 2012-11-25 LAB — PROTIME-INR: Prothrombin Time: 14.5 seconds (ref 11.6–15.2)

## 2012-11-25 LAB — CBC
HCT: 35.8 % — ABNORMAL LOW (ref 36.0–46.0)
MCHC: 33 g/dL (ref 30.0–36.0)
MCV: 86.5 fL (ref 78.0–100.0)
Platelets: 212 10*3/uL (ref 150–400)
RDW: 16.2 % — ABNORMAL HIGH (ref 11.5–15.5)
WBC: 11.8 10*3/uL — ABNORMAL HIGH (ref 4.0–10.5)

## 2012-11-25 LAB — BASIC METABOLIC PANEL
BUN: 22 mg/dL (ref 6–23)
Calcium: 9 mg/dL (ref 8.4–10.5)
Chloride: 103 mEq/L (ref 96–112)
Creatinine, Ser: 1.09 mg/dL (ref 0.50–1.10)
GFR calc Af Amer: 57 mL/min — ABNORMAL LOW (ref >90)
GFR calc non Af Amer: 49 mL/min — ABNORMAL LOW (ref >90)

## 2012-11-25 LAB — GLUCOSE, CAPILLARY
Glucose-Capillary: 181 mg/dL — ABNORMAL HIGH (ref 70–99)
Glucose-Capillary: 180 mg/dL — ABNORMAL HIGH (ref 70–99)

## 2012-11-25 MED ORDER — ALPRAZOLAM 0.5 MG PO TABS
0.5000 mg | ORAL_TABLET | Freq: Three times a day (TID) | ORAL | Status: DC | PRN
  Administered 2012-11-26: 0.5 mg via ORAL
  Filled 2012-11-25 (×2): qty 1

## 2012-11-25 NOTE — Progress Notes (Signed)
Physical Therapy Treatment Patient Details Name: Sarah Mcguire MRN: [id number] DOB: 1940-12-27 Today's Date: 11/25/2012 Time: 4098-1191 PT Time Calculation (min): 25 min  PT Assessment / Plan / Recommendation Comments on Treatment Session  Patient s/p L TKA. Patient limited by her anxiety level, however with much improvement this afternoon. Patient still needing encouargement to push herself to do more but much better progress and quality of mobility this afternoon    Follow Up Recommendations  Home health PT;Supervision/Assistance - 24 hour     Does the patient have the potential to tolerate intense rehabilitation     Barriers to Discharge        Equipment Recommendations  None recommended by PT    Recommendations for Other Services    Frequency 7X/week   Plan Discharge plan remains appropriate;Frequency remains appropriate    Precautions / Restrictions Precautions Precautions: Knee Restrictions LLE Weight Bearing: Weight bearing as tolerated   Pertinent Vitals/Pain     Mobility  Bed Mobility Supine to Sit: 4: Min assist Sitting - Scoot to Edge of Bed: 4: Min assist Details for Bed Mobility Assistance: Cues for technique and A for LLE out of bed.  Transfers Sit to Stand: 4: Min assist;With upper extremity assist;From bed;From chair/3-in-1 Stand to Sit: To chair/3-in-1;With upper extremity assist;4: Min assist Details for Transfer Assistance: A for stability and safety. Cues for hand placement and to control descent into recliner Ambulation/Gait Ambulation/Gait Assistance: 4: Min assist Ambulation Distance (Feet): 60 Feet Assistive device: Rolling walker Ambulation/Gait Assistance Details: Cues for RW positioning and posture. Patient tends to bend over RW.  Gait Pattern: Step-to pattern;Decreased step length - right;Decreased step length - left;Antalgic Gait velocity: decreased    Exercises     PT Diagnosis:    PT Problem List:   PT Treatment Interventions:      PT Goals Acute Rehab PT Goals PT Goal: Supine/Side to Sit - Progress: Progressing toward goal PT Goal: Sit to Stand - Progress: Progressing toward goal PT Goal: Stand to Sit - Progress: Progressing toward goal PT Goal: Ambulate - Progress: Progressing toward goal  Visit Information  Last PT Received On: 11/25/12 Assistance Needed: +2    Subjective Data      Cognition  Cognition Overall Cognitive Status: Appears within functional limits for tasks assessed/performed Arousal/Alertness: Awake/alert Orientation Level: Appears intact for tasks assessed Behavior During Session: [facility] for tasks performed Cognition - Other Comments: Patient still becoming anxious at times but listening better to cueing and precautions.     Balance     End of Session PT - End of Session Equipment Utilized During Treatment: Gait belt Patient left: in chair;with call bell/phone within reach;with family/visitor present;with nursing in room   GP     Fredrich Birks 11/25/2012, 2:29 PM 11/25/2012 Fredrich Birks PTA [phone] pager [phone] office

## 2012-11-25 NOTE — Progress Notes (Signed)
Patient has been requesting pain medication as well as her xanax. However, patient is very lethargic. Patient cannot keep her eyes open long enough to say a complete sentence or answer RN's questions. Therefore, RN will manage pain as best as possible without over sedating the patient. Patient's vital signs are WDL.   Pain management has been discussed twice this morning with the patient, and he husband, however, they both need reinforcement. They need to be reminded that we cannot over sedate her with pain medication and anti-anxiety medication. Patient has to be able to get up and work with physical therapy to progress through her mobility goals, and the healing of her knee.   Patient's husband wants the patient to be given pain medication as soon as the medication is available. However, RN has explained to the patient and family, that pain medication must be requested.  [name] was upset with the nurse because the patient had not been doing her incentive spirometer. Again, the patient had been educated yesterday upon arrival to the floor, as well as this morning by day shift RN. However, the patient is not staying awake and alert to complete her incentive spirometer hourly. When Rn educated the patient about the Incentive, the incentive showed 1000 mL out of 2500 mL. Encouraged patient to do the IS 10 times per hour.   The patient started hyperventilating this morning when I told her that she had to pee by 1500, or we had to scan her bladder. The patient was informed about the definition of a bladder scan, which included, "With a bladder scan, we simply put some surgical jelly on the lower part of her abdomen, and use a small scanner that rolls around on your abdomen to see how much urine is in your bladder." Patient started crying and stated "Im so scared, Im so scared, take me home, take me home." Patient voided at 1410 with OT and PT.   [name] has asked me about fixing the heat in the room because  he says it is very very cold. Facilities management was called twice about the heating in the patients room. Facilities moved the maximum heat limit to 73 degrees Farenheit.

## 2012-11-25 NOTE — Care Management Note (Signed)
CARE MANAGEMENT NOTE 11/25/2012  Patient:  Sarah Mcguire, Sarah Mcguire   Account Number:  [id number]  Date Initiated:  11/25/2012  Documentation initiated by:  Vance Peper  Subjective/Objective Assessment:   20 yr. old female s/p left total knee arthroplasty.     Action/Plan:   CM spoke with patient's husband concerning home health and DME needs. Choice was offered. Patient has received rolling walker, 3in1 and CPM.Preoperatively setup with [facility], no changes.   Anticipated DC Date:  11/27/2012   Anticipated DC Plan:  HOME W HOME HEALTH SERVICES      DC Planning Services  CM consult      [facility] Choice  HOME HEALTH   Choice offered to / List presented to:  C-3 Spouse      DME agency  [employer]     HH arranged  HH-1 RN  HH-2 PT      HH agency  [facility]   Status of service:  Completed, signed off Medicare Important Message given?   (If response is "NO", the following Medicare IM given date fields will be blank) Date Medicare IM given:   Date Additional Medicare IM given:    Discharge Disposition:  HOME W HOME HEALTH SERVICES  Per UR Regulation:    If discussed at Long Length of Stay Meetings, dates discussed:    Comments:

## 2012-11-25 NOTE — Progress Notes (Signed)
PT is recommending home with [facility] and not SNF. Patient is also requesting to go home.. Clinical Social Worker will sign off for now as social work intervention is no longer needed. Please consult [place] again if new need arises.   Sabino Niemann, MSW [phone]

## 2012-11-25 NOTE — Progress Notes (Signed)
Physical Therapy Treatment Patient Details Name: Sarah Mcguire MRN: [id number] DOB: May 15, 1941 Today's Date: 11/25/2012 Time: 4098-1191 PT Time Calculation (min): 33 min  PT Assessment / Plan / Recommendation Comments on Treatment Session  Patient s/p L TKA. Patient limited by her anxiety level. Patient preservating on wanting to go home now. Patient educated on level that must be achieved prior to discharge home. Husband states that patient is very anxious in general and does take medication for anixety. Will attempt to increase activity later this afternoon    Follow Up Recommendations  Home health PT;Supervision/Assistance - 24 hour     Does the patient have the potential to tolerate intense rehabilitation     Barriers to Discharge        Equipment Recommendations  None recommended by PT    Recommendations for Other Services    Frequency 7X/week   Plan Discharge plan remains appropriate;Frequency remains appropriate    Precautions / Restrictions Precautions Precautions: Knee Restrictions LLE Weight Bearing: Weight bearing as tolerated   Pertinent Vitals/Pain 8/10 knee pain. Patient anxious throughout.     Mobility  Bed Mobility Supine to Sit: 4: Min assist Sitting - Scoot to Edge of Bed: 4: Min assist Details for Bed Mobility Assistance: Cues for technique and A for LLE out of bed.  Transfers Sit to Stand: 4: Min assist;With upper extremity assist;From bed;From toilet Stand to Sit: 4: Min assist;With upper extremity assist;To toilet;To chair/3-in-1 Details for Transfer Assistance: Max cues for technique and safety throughout. A for stability as patient becomes anxious and becomes unsafe with RW and precautions.  Ambulation/Gait Ambulation/Gait Assistance: 3: Mod assist Ambulation Distance (Feet): 30 Feet Assistive device: Rolling walker Ambulation/Gait Assistance Details: Patient able to ambulate slowly this morning. Patient with increased anxiety with ambulation.  Unsafe with RW.  Gait Pattern: Step-to pattern;Antalgic;Trunk flexed Gait velocity: decreased    Exercises Total Joint Exercises Ankle Circles/Pumps: AROM;Both;10 reps Heel Slides: AAROM;Left;10 reps Hip ABduction/ADduction: AAROM;Left;10 reps   PT Diagnosis:    PT Problem List:   PT Treatment Interventions:     PT Goals Acute Rehab PT Goals PT Goal: Supine/Side to Sit - Progress: Progressing toward goal PT Goal: Sit to Stand - Progress: Progressing toward goal PT Goal: Stand to Sit - Progress: Progressing toward goal PT Goal: Ambulate - Progress: Progressing toward goal PT Goal: Perform Home Exercise Program - Progress: Progressing toward goal  Visit Information  Last PT Received On: 11/25/12 Assistance Needed: +2 (for chair and safety)    Subjective Data      Cognition  Cognition Overall Cognitive Status: Impaired Area of Impairment: Following commands;Safety/judgement;Awareness of errors;Awareness of deficits Orientation Level: Oriented X4 / Intact Behavior During Session: Anxious Following Commands: Follows one step commands inconsistently Safety/Judgement: Decreased awareness of safety precautions;Impulsive;Decreased safety judgement for tasks assessed;Decreased awareness of need for assistance Awareness of Errors: Assistance required to correct errors made Cognition - Other Comments: Patient incredibly anxious this morning. Patient moaning or yelling out throughout session.     Balance     End of Session PT - End of Session Equipment Utilized During Treatment: Gait belt Activity Tolerance: Other (comment) (Patient limited by anxiety throughout. )   GP     Kyrillos Adams, Adline Potter 11/25/2012, 10:20 AM

## 2012-11-25 NOTE — Progress Notes (Signed)
Occupational Therapy Evaluation Patient Details Name: Sarah Mcguire MRN: [id number] DOB: 06-Nov-1940 Today's Date: 11/25/2012 Time: 0102-7253 OT Time Calculation (min): 45 min  OT Assessment / Plan / Recommendation Clinical Impression  72 yo s/p L TKA. WBAT. PTA pt lived with husband and was indpendent with ADL and mod I with mobility @ RW level..Plans to D/C home with husband who can provide 24/7 S. Pt appropriate for D/C home with [facility] services. Pt desats on RA to 85. 2L increases to 95 >30 seconds. Educated pt/family on importance on use of incenetive spirometer. Nsg notified. All further OT to be addressed by HHOT.    OT Assessment  All further OT needs can be met in the next venue of care    Follow Up Recommendations  Home health OT    Barriers to Discharge      Equipment Recommendations  Tub/shower bench    Recommendations for Other Services    Frequency       Precautions / Restrictions Precautions Precautions: Knee Restrictions LLE Weight Bearing: Weight bearing as tolerated   Pertinent Vitals/Pain Did not rate pain. Pain meds given @ 1400.  Pt desats on RA to 85. Slow rebound to 95 on 2 L. Discussed need to recheck O2 sats with nsg.    ADL  Upper Body Bathing: Set up Where Assessed - Upper Body Bathing: Unsupported sitting Lower Body Bathing: Minimal assistance Where Assessed - Lower Body Bathing: Unsupported sit to stand Upper Body Dressing: Set up Where Assessed - Upper Body Dressing: Unsupported sitting Lower Body Dressing: Minimal assistance Where Assessed - Lower Body Dressing: Unsupported sit to stand Toilet Transfer: Min guard Toilet Transfer Method: Stand pivot;Other (comment) (ambulating) Toilet Transfer Equipment: Bedside commode Toileting - Clothing Manipulation and Hygiene: Supervision/safety Where Assessed - Engineer, mining and Hygiene: Standing Tub/Shower Transfer:  (educated husband on tub bench/transfer) Equipment Used: Gait  belt;Rolling walker Transfers/Ambulation Related to ADLs: Min A at beginning of session due to apparent anxiety. After pt's anxiety lessoned, pt min guared ADL Comments: Educated family on DME /AE for ADL. Rec use of tub bench and discussed home safety.    OT Diagnosis: Generalized weakness;Acute pain  OT Problem List: Decreased strength;Decreased range of motion;Decreased activity tolerance;Decreased knowledge of use of DME or AE;Cardiopulmonary status limiting activity;Obesity;Pain OT Treatment Interventions:     OT Goals Acute Rehab OT Goals OT Goal Formulation:  (eval only)  Visit Information  Last OT Received On: 11/25/12 Assistance Needed: +1 PT/OT Co-Evaluation/Treatment: Yes    Subjective Data  Patient Stated Goal: I want to go home   Prior Functioning     Home Living Lives With: Spouse Available Help at Discharge: Family;Available 24 hours/day Type of Home: House ([place]) Home Access: Stairs to enter [employer] of Steps: 1 Entrance Stairs-Rails: None Home Layout: One level Bathroom Shower/Tub: Forensic scientist: Standard Bathroom Accessibility: Yes How Accessible: Accessible via walker Home Adaptive Equipment: Walker - rolling Prior Function Level of Independence: Independent with assistive device(s);Needs assistance (Uses RW) Needs Assistance: Light Housekeeping;Meal Prep Meal Prep: Maximal Light Housekeeping: Maximal Able to Take Stairs?: Yes (with assistance) 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)         Vision/Perception     Copywriter, advertising Overall Cognitive Status: Appears within functional limits for tasks assessed/performed Arousal/Alertness: Awake/alert Orientation Level: Appears intact for tasks assessed Behavior During Session: [facility] for tasks performed Cognition - Other Comments: very anxious    Extremity/Trunk Assessment Right Upper Extremity Assessment RUE  ROM/Strength/Tone: [facility] for tasks assessed Left Upper Extremity Assessment LUE ROM/Strength/Tone: WFL for tasks assessed Right Lower Extremity Assessment RLE ROM/Strength/Tone: [facility] for tasks assessed Left Lower Extremity Assessment LLE ROM/Strength/Tone: Deficits;Due to pain Trunk Assessment Trunk Assessment: Normal     Mobility Bed Mobility Supine to Sit: 4: Min assist Sitting - Scoot to Edge of Bed: 4: Min assist Details for Bed Mobility Assistance: Cues for technique and A for LLE out of bed.  Transfers Transfers: Sit to Stand;Stand to Sit Sit to Stand: 4: Min assist;With upper extremity assist;From bed;From chair/3-in-1 Stand to Sit: To chair/3-in-1;With upper extremity assist;4: Min assist Details for Transfer Assistance: A for stability and safety. Cues for hand placement and to control descent into recliner     Exercise Other Exercises Other Exercises: incentive spiromter encouraged 5x/hour   Balance Balance Balance Assessed:  (S)   End of Session OT - End of Session Equipment Utilized During Treatment: Gait belt Activity Tolerance: Patient tolerated treatment well Patient left: in chair;with call bell/phone within reach;with family/visitor present Nurse Communication: Mobility status;Other (comment) (pt voided)  GO     Braun Rocca,HILLARY 11/25/2012, 2:56 PM [facility], OTR/L  [phone] 11/25/2012

## 2012-11-25 NOTE — Progress Notes (Signed)
UR COMPLETED  

## 2012-11-25 NOTE — Progress Notes (Signed)
Patient ID: Sarah Mcguire, female   DOB: 04-Jul-1941, 72 y.o.   MRN: [id number] PATIENT ID: Sarah Mcguire  MRN: [id number]  DOB/AGE:  01-07-1941 / 72 y.o.  1 Day Post-Op Procedure(s) (LRB): TOTAL KNEE ARTHROPLASTY (Left)    PROGRESS NOTE Subjective: Patient is alert, oriented, speech slightly slurred from Xanax. No Nausea, no Vomiting, yes passing gas, no Bowel Movement. Taking PO sips. Denies SOB, Chest or Calf Pain. Using Incentive Spirometer, PAS in place. Ambulate WBAT today, CPM 0-60 Patient reports pain as 5 on 0-10 scale  .    Objective: Vital signs in last 24 hours: Filed Vitals:   11/24/12 1340 11/24/12 1539 11/24/12 2149 11/25/12 0521  BP: 95/67  93/70 110/70  Pulse: 95  75 70  Temp: 98.8 F (37.1 C) 100.2 F (37.9 C) 98.8 F (37.1 C) 98.9 F (37.2 C)  TempSrc: Oral Axillary Oral Oral  Resp:   16 16  SpO2: 97% 100% 97% 95%      Intake/Output from previous day: I/O last 3 completed shifts: In: 1150 [I.V.:1000; Other:150] Out: 2550 [Urine:2050; Drains:450; Blood:50]   Intake/Output this shift:     LABORATORY DATA:  Recent Labs  11/24/12 0707 11/24/12 1437 11/24/12 1727 11/24/12 2139 11/25/12 0630 11/25/12 0645  WBC  --  22.7*  --   --  11.8*  --   HGB  --  13.6  --   --  11.8*  --   HCT  --  41.7  --   --  35.8*  --   PLT  --  262  --   --  212  --   NA  --   --   --   --  134*  --   K  --   --   --   --  5.3*  --   CL  --   --   --   --  103  --   CO2  --   --   --   --  25  --   BUN  --   --   --   --  22  --   CREATININE  --  0.99  --   --  1.09  --   GLUCOSE  --   --   --   --  134*  --   GLUCAP  --   --  158* 165*  --  131*  INR 1.01  --   --   --  1.15  --   CALCIUM  --   --   --   --  9.0  --     Examination: Neurologically intact ABD soft Neurovascular intact Sensation intact distally Intact pulses distally Dorsiflexion/Plantar flexion intact Incision: no drainage No cellulitis present Compartment soft} Blood and plasma separated in  drain indicating minimal recent drainage, drain pulled without difficulty.  Assessment:   1 Day Post-Op Procedure(s) (LRB): TOTAL KNEE ARTHROPLASTY (Left) ADDITIONAL DIAGNOSIS:  Anxiety, peripheral neuropathy  Plan: PT/OT WBAT, CPM 5/hrs day until ROM 0-90 degrees, then D/C CPM, decrease Xanax to 0.5 to 1 mg tid DVT Prophylaxis:  SCDx72hrs, ASA 325 mg BID x 2 weeks DISCHARGE PLAN: Home DISCHARGE NEEDS: HHPT, HHRN, CPM, Walker and 3-in-1 comode seat     Quandarius Nill J 11/25/2012, 7:40 AM

## 2012-11-26 DIAGNOSIS — M1712 Unilateral primary osteoarthritis, left knee: Secondary | ICD-10-CM | POA: Diagnosis present

## 2012-11-26 LAB — BLOOD GAS, ARTERIAL
Acid-base deficit: 5.2 mmol/L — ABNORMAL HIGH (ref 0.0–2.0)
Bicarbonate: 20.3 mEq/L (ref 20.0–24.0)
O2 Saturation: 99.2 %
TCO2: 21.6 mmol/L (ref 0–100)
pO2, Arterial: 196 mmHg — ABNORMAL HIGH (ref 80.0–100.0)

## 2012-11-26 LAB — BASIC METABOLIC PANEL
CO2: 26 mEq/L (ref 19–32)
Chloride: 99 mEq/L (ref 96–112)
GFR calc non Af Amer: 62 mL/min — ABNORMAL LOW (ref >90)
Glucose, Bld: 146 mg/dL — ABNORMAL HIGH (ref 70–99)
Potassium: 5.3 mEq/L — ABNORMAL HIGH (ref 3.5–5.1)
Sodium: 132 mEq/L — ABNORMAL LOW (ref 135–145)

## 2012-11-26 LAB — PROTIME-INR
INR: 1.87 — ABNORMAL HIGH (ref 0.00–1.49)
Prothrombin Time: 20.8 seconds — ABNORMAL HIGH (ref 11.6–15.2)

## 2012-11-26 LAB — CBC
Hemoglobin: 11.3 g/dL — ABNORMAL LOW (ref 12.0–15.0)
Platelets: 202 10*3/uL (ref 150–400)
RBC: 3.99 MIL/uL (ref 3.87–5.11)
WBC: 13.9 10*3/uL — ABNORMAL HIGH (ref 4.0–10.5)

## 2012-11-26 LAB — GLUCOSE, CAPILLARY: Glucose-Capillary: 116 mg/dL — ABNORMAL HIGH (ref 70–99)

## 2012-11-26 MED ORDER — HYDROCODONE-ACETAMINOPHEN 5-325 MG PO TABS
1.0000 | ORAL_TABLET | ORAL | Status: DC | PRN
  Administered 2012-11-26 – 2012-11-27 (×3): 1 via ORAL
  Filled 2012-11-26: qty 2
  Filled 2012-11-26 (×2): qty 1

## 2012-11-26 MED ORDER — HYDROCODONE-ACETAMINOPHEN 5-325 MG PO TABS: 1.0000 | ORAL_TABLET | ORAL | Status: DC | PRN

## 2012-11-26 MED ORDER — NALOXONE HCL 0.4 MG/ML IJ SOLN
INTRAMUSCULAR | Status: AC
  Administered 2012-11-26: 05:00:00
  Filled 2012-11-26: qty 1

## 2012-11-26 NOTE — Progress Notes (Signed)
Physical Therapy Treatment Patient Details Name: Sarah Mcguire MRN: [id number] DOB: 07/20/1941 Today's Date: 11/26/2012 Time: 5284-1324 PT Time Calculation (min): 42 min  PT Assessment / Plan / Recommendation Comments on Treatment Session  Patient s/p L TKA. Patient needing some encouragement to participate and ambulate again this afternoon as she had just sat done from going to the restroom and back. Patient will practice one step in AM in hopes to DC home after first session of PT.    Follow Up Recommendations  Home health PT;Supervision/Assistance - 24 hour     Does the patient have the potential to tolerate intense rehabilitation     Barriers to Discharge        Equipment Recommendations  None recommended by PT    Recommendations for Other Services    Frequency 7X/week   Plan Discharge plan remains appropriate;Frequency remains appropriate    Precautions / Restrictions Precautions Precautions: Knee Restrictions LLE Weight Bearing: Weight bearing as tolerated   Pertinent Vitals/Pain     Mobility  Bed Mobility Sitting - Scoot to Edge of Bed: 4: Min assist Details for Bed Mobility Assistance: Cues for technique and A for LLE into bed Transfers Sit to Stand: 4: Min assist;With upper extremity assist;With armrests;From chair/3-in-1 Stand to Sit: 4: Min assist;With upper extremity assist;To bed Details for Transfer Assistance: . Cues for hand placement and to control descent onto bed Ambulation/Gait Ambulation/Gait Assistance: 4: Min assist Ambulation Distance (Feet): 60 Feet Assistive device: Rolling walker Ambulation/Gait Assistance Details: Patient with better awareness of keeping RW closer to body. Cues for posture and breathing technique Gait Pattern: Step-to pattern;Decreased step length - right;Decreased step length - left;Antalgic Gait velocity: decreased    Exercises Total Joint Exercises Ankle Circles/Pumps: AROM;Both;10 reps Quad Sets: AAROM;Left;10  reps Heel Slides: AAROM;Left;10 reps Hip ABduction/ADduction: AAROM;Left;10 reps Straight Leg Raises: AAROM;Left;10 reps   PT Diagnosis:    PT Problem List:   PT Treatment Interventions:     PT Goals Acute Rehab PT Goals PT Goal: Sit to Supine/Side - Progress: Progressing toward goal PT Goal: Sit to Stand - Progress: Progressing toward goal PT Goal: Stand to Sit - Progress: Progressing toward goal PT Goal: Ambulate - Progress: Progressing toward goal PT Goal: Perform Home Exercise Program - Progress: Progressing toward goal  Visit Information  Last PT Received On: 11/26/12 Assistance Needed: +2 (helpful for oxy and chair)    Subjective Data      Cognition  Cognition Overall Cognitive Status: Appears within functional limits for tasks assessed/performed Arousal/Alertness: Awake/alert Orientation Level: Appears intact for tasks assessed Behavior During Session: [facility] for tasks performed    Balance     End of Session PT - End of Session Equipment Utilized During Treatment: Gait belt;Oxygen Activity Tolerance: Patient tolerated treatment well;Patient limited by fatigue Patient left: in bed;with call bell/phone within reach Nurse Communication: Mobility status   GP     Fredrich Birks 11/26/2012, 2:26 PM 11/26/2012 Fredrich Birks PTA [phone] pager [phone] office

## 2012-11-26 NOTE — Progress Notes (Signed)
Physical Therapy Treatment Patient Details Name: Sarah Mcguire MRN: [id number] DOB: 12-13-1940 Today's Date: 11/26/2012 Time: 1013-1040 PT Time Calculation (min): 27 min  PT Assessment / Plan / Recommendation Comments on Treatment Session  Patient s/p L TKA. Patient progressing slightly today with decreased anxiety. Continues to be limited by fatique. Patient will need to review step prior to DC home which is possible for tomorrow based on progress notes by MD.     Follow Up Recommendations  Home health PT;Supervision/Assistance - 24 hour     Does the patient have the potential to tolerate intense rehabilitation     Barriers to Discharge        Equipment Recommendations  None recommended by PT    Recommendations for Other Services    Frequency 7X/week   Plan Discharge plan remains appropriate;Frequency remains appropriate    Precautions / Restrictions Precautions Precautions: Knee Restrictions Weight Bearing Restrictions: Yes LLE Weight Bearing: Weight bearing as tolerated   Pertinent Vitals/Pain     Mobility  Transfers Sit to Stand: 4: Min guard;With upper extremity assist;With armrests;From chair/3-in-1 Stand to Sit: To chair/3-in-1;4: Min assist;With upper extremity assist;With armrests Details for Transfer Assistance: . Cues for hand placement and to control descent into recliner Ambulation/Gait Ambulation/Gait Assistance: 4: Min assist Ambulation Distance (Feet): 60 Feet Assistive device: Rolling walker Ambulation/Gait Assistance Details: Cues for posture and for RW management. Patient wanting to keep RW too far away from her body making her lean over and become off balance. Max cues to correct Gait Pattern: Step-to pattern;Decreased step length - right;Decreased step length - left;Antalgic Gait velocity: decreased    Exercises Total Joint Exercises Ankle Circles/Pumps: AROM;Both;10 reps Quad Sets: AAROM;Left;10 reps Heel Slides: AAROM;Left;10 reps Hip  ABduction/ADduction: AAROM;Left;10 reps Straight Leg Raises: AAROM;Left;10 reps   PT Diagnosis:    PT Problem List:   PT Treatment Interventions:     PT Goals Acute Rehab PT Goals PT Goal: Sit to Stand - Progress: Progressing toward goal PT Goal: Stand to Sit - Progress: Progressing toward goal PT Goal: Ambulate - Progress: Progressing toward goal PT Goal: Perform Home Exercise Program - Progress: Progressing toward goal  Visit Information  Last PT Received On: 11/26/12 Assistance Needed: +1    Subjective Data      Cognition  Cognition Overall Cognitive Status: Appears within functional limits for tasks assessed/performed Arousal/Alertness: Awake/alert Orientation Level: Appears intact for tasks assessed Behavior During Session: [facility] for tasks performed    Balance     End of Session PT - End of Session Equipment Utilized During Treatment: Gait belt;Oxygen Activity Tolerance: Patient tolerated treatment well;Patient limited by fatigue Patient left: in chair;with call bell/phone within reach Nurse Communication: Mobility status CPM Left Knee CPM Left Knee: Off   GP     Fredrich Birks 11/26/2012, 1:19 PM 11/26/2012 Fredrich Birks PTA [phone] pager [phone] office

## 2012-11-26 NOTE — Progress Notes (Signed)
Inpatient Diabetes Program Recommendations  AACE/ADA: New Consensus Statement on Inpatient Glycemic Control (2013)  Target Ranges:  Prepandial:   less than 140 mg/dL      Peak postprandial:   less than 180 mg/dL (1-2 hours)      Critically ill patients:  140 - 180 mg/dL  Results for TAMMY, WICKLIFFE (MRN [id number]) as of 11/26/2012 11:50  Ref. Range 11/25/2012 11:54 11/25/2012 16:59 11/25/2012 22:45 11/26/2012 05:00 11/26/2012 11:25  Glucose-Capillary Latest Range: 70-99 mg/dL 409 (H) 811 (H) 914 (H) 205 (H) 120 (H)    Inpatient Diabetes Program Recommendations Correction (SSI): start Novolog sensitive scale TID per Glycemic Control order set Thank you  Piedad Climes BSN, RN,CDE Inpatient Diabetes Coordinator [phone] (team pager)

## 2012-11-26 NOTE — Progress Notes (Signed)
@    approx 0445 called by pt husband to room, pt c/o having SOB,O2sats 87% on O2 at 2LPM/[place]. O2 increased to 4LPM. Rapid response nurse notified, stated to call Respiratory Therapist as well. Non-rebreathing mask placed by RT and pt coached to deep breathing. Rapid response nurse ordered Narcan to give to pt.CBG checked--205. Rechecked O2 sat up  to 97%. Arterial blood gas was also obtained and results are ok per Rapid response nurse. Pt. Placed back to 4LPM/[place].VS as follows: 97.2,116,24,93/63 O2 sat at 98%. Notified on call Arnell Sieving regarding pt incident.

## 2012-11-26 NOTE — Progress Notes (Addendum)
PATIENT ID: Sarah Mcguire  MRN: [id number]  DOB/AGE:  73-26-1942 / 72 y.o.  2 Days Post-Op Procedure(s) (LRB): TOTAL KNEE ARTHROPLASTY (Left)    PROGRESS NOTE Subjective: Patient is awake but drowsy, oriented, no Nausea, no Vomiting, yes passing gas, no Bowel Movement. Taking PO well. Denies SOB, Chest or Calf Pain. Using Incentive Spirometer, PAS in place. Ambulating slowly with PT Patient reports pain as moderate  .    Objective: Vital signs in last 24 hours: Filed Vitals:   11/25/12 1500 11/25/12 1504 11/25/12 2056 11/26/12 0628  BP:   127/64 96/66  Pulse:   98 87  Temp:   98.1 F (36.7 C) 98.7 F (37.1 C)  TempSrc:   Oral Oral  Resp:   18 18  SpO2: 85% 95% 95% 95%      Intake/Output from previous day: I/O last 3 completed shifts: In: 390 [P.O.:240; Other:150] Out: 1725 [Urine:1550; Drains:175]   Intake/Output this shift:     LABORATORY DATA:  Recent Labs  11/24/12 1437  11/25/12 0630  11/25/12 1659 11/25/12 2245 11/26/12 0500 11/26/12 0510  WBC 22.7*  --  11.8*  --   --   --   --  13.9*  HGB 13.6  --  11.8*  --   --   --   --  11.3*  HCT 41.7  --  35.8*  --   --   --   --  34.9*  PLT 262  --  212  --   --   --   --  202  NA  --   --  134*  --   --   --   --   --   K  --   --  5.3*  --   --   --   --   --   CL  --   --  103  --   --   --   --   --   CO2  --   --  25  --   --   --   --   --   BUN  --   --  22  --   --   --   --   --   CREATININE 0.99  --  1.09  --   --   --   --   --   GLUCOSE  --   --  134*  --   --   --   --   --   GLUCAP  --   < >  --   < > 180* 147* 205*  --   INR  --   --  1.15  --   --   --   --  1.87*  CALCIUM  --   --  9.0  --   --   --   --   --   < > = values in this interval not displayed.  Examination: Neurologically intact ABD soft Neurovascular intact Sensation intact distally Intact pulses distally Dorsiflexion/Plantar flexion intact Incision: dressing C/D/I}  Assessment:   2 Days Post-Op Procedure(s) (LRB): TOTAL  KNEE ARTHROPLASTY (Left) ADDITIONAL DIAGNOSIS:  none  Plan: PT/OT WBAT, CPM 5/hrs day until ROM 0-90 degrees, then D/C CPM  Will decrease pain meds today - sedation and decreased 02 sats earlier this a.m. Dressing change. Hold Benazepril and check BMET today.  DVT Prophylaxis:  SCDx72hrs, ASA 325 mg BID x 2 weeks DISCHARGE PLAN: Home  tomorrow if PT goals met. DISCHARGE NEEDS: HHPT, HHRN, CPM, Walker and 3-in-1 comode seat     Sarah Mcguire M. 11/26/2012, 8:13 AM

## 2012-11-26 NOTE — Significant Event (Signed)
Rapid Response Event Note  Overview: Time Called: 0445 Arrival Time: 0450 Event Type: Respiratory  Initial Focused Assessment: Called by bedside RN regarding patient in respiratory distress. Upon arrival patient on [place] 4L, sats 84%, placed on 100% NRB sats up to 100% patient lethargic taking shallow respirations rate 30, HR 120s SBP 100. No rhonchi, crackles, or wheezing noted. Patient temp 98.9 axillary. CBG 205  Interventions: RT at bedside, ABG obtained per protocol. Patient on extensive amounts of pain medication and had issues throughout the day with lethargy, Narcan given per protocol. Patient coached to take deep breaths and relaxation techniques. Patient able to speak in complete sentences when relaxed, denies chest pain, denies shortness of breath. Patient appears to be hyperventilating at times and states "I just want to get out of here, take this off of me". Husband at bedside trying to help calm patient. Patient on 4L [place] sats 95%, HR 120s. Advised bedside RN to notify MD for further orders. Will continue to monitor, advised bedside RN to call with further needs   Event Summary:  Attending PA at  0545    at          [facility], Isidoro Donning

## 2012-11-27 ENCOUNTER — Inpatient Hospital Stay (HOSPITAL_COMMUNITY)
Admission: EM | Admit: 2012-11-27 | Discharge: 2012-12-01 | DRG: 308 | Disposition: A | Discharge status: Skilled Nursing Facility | Payer: Medicare Other | Attending: Family Medicine | Admitting: Family Medicine

## 2012-11-27 ENCOUNTER — Emergency Department (HOSPITAL_COMMUNITY): Payer: Medicare Other

## 2012-11-27 ENCOUNTER — Encounter (HOSPITAL_COMMUNITY): Payer: Self-pay | Admitting: *Deleted

## 2012-11-27 DIAGNOSIS — R300 Dysuria: Secondary | ICD-10-CM

## 2012-11-27 DIAGNOSIS — E669 Obesity, unspecified: Secondary | ICD-10-CM

## 2012-11-27 DIAGNOSIS — Z8249 Family history of ischemic heart disease and other diseases of the circulatory system: Secondary | ICD-10-CM

## 2012-11-27 DIAGNOSIS — J309 Allergic rhinitis, unspecified: Secondary | ICD-10-CM

## 2012-11-27 DIAGNOSIS — Z0000 Encounter for general adult medical examination without abnormal findings: Secondary | ICD-10-CM

## 2012-11-27 DIAGNOSIS — I81 Portal vein thrombosis: Secondary | ICD-10-CM

## 2012-11-27 DIAGNOSIS — M25569 Pain in unspecified knee: Secondary | ICD-10-CM | POA: Diagnosis present

## 2012-11-27 DIAGNOSIS — Z86711 Personal history of pulmonary embolism: Secondary | ICD-10-CM

## 2012-11-27 DIAGNOSIS — D649 Anemia, unspecified: Secondary | ICD-10-CM

## 2012-11-27 DIAGNOSIS — K219 Gastro-esophageal reflux disease without esophagitis: Secondary | ICD-10-CM

## 2012-11-27 DIAGNOSIS — Z6832 Body mass index (BMI) 32.0-32.9, adult: Secondary | ICD-10-CM

## 2012-11-27 DIAGNOSIS — F419 Anxiety disorder, unspecified: Secondary | ICD-10-CM

## 2012-11-27 DIAGNOSIS — R011 Cardiac murmur, unspecified: Secondary | ICD-10-CM

## 2012-11-27 DIAGNOSIS — I509 Heart failure, unspecified: Secondary | ICD-10-CM

## 2012-11-27 DIAGNOSIS — F329 Major depressive disorder, single episode, unspecified: Secondary | ICD-10-CM | POA: Diagnosis present

## 2012-11-27 DIAGNOSIS — G8929 Other chronic pain: Secondary | ICD-10-CM | POA: Diagnosis present

## 2012-11-27 DIAGNOSIS — M25559 Pain in unspecified hip: Secondary | ICD-10-CM

## 2012-11-27 DIAGNOSIS — N390 Urinary tract infection, site not specified: Secondary | ICD-10-CM

## 2012-11-27 DIAGNOSIS — Z79899 Other long term (current) drug therapy: Secondary | ICD-10-CM

## 2012-11-27 DIAGNOSIS — R000 Tachycardia, unspecified: Secondary | ICD-10-CM

## 2012-11-27 DIAGNOSIS — Z96659 Presence of unspecified artificial knee joint: Secondary | ICD-10-CM

## 2012-11-27 DIAGNOSIS — J9600 Acute respiratory failure, unspecified whether with hypoxia or hypercapnia: Secondary | ICD-10-CM

## 2012-11-27 DIAGNOSIS — R109 Unspecified abdominal pain: Secondary | ICD-10-CM

## 2012-11-27 DIAGNOSIS — M549 Dorsalgia, unspecified: Secondary | ICD-10-CM

## 2012-11-27 DIAGNOSIS — I428 Other cardiomyopathies: Secondary | ICD-10-CM

## 2012-11-27 DIAGNOSIS — J4489 Other specified chronic obstructive pulmonary disease: Secondary | ICD-10-CM | POA: Diagnosis present

## 2012-11-27 DIAGNOSIS — Z7901 Long term (current) use of anticoagulants: Secondary | ICD-10-CM

## 2012-11-27 DIAGNOSIS — E785 Hyperlipidemia, unspecified: Secondary | ICD-10-CM

## 2012-11-27 DIAGNOSIS — I82409 Acute embolism and thrombosis of unspecified deep veins of unspecified lower extremity: Secondary | ICD-10-CM

## 2012-11-27 DIAGNOSIS — K5732 Diverticulitis of large intestine without perforation or abscess without bleeding: Secondary | ICD-10-CM

## 2012-11-27 DIAGNOSIS — K5792 Diverticulitis of intestine, part unspecified, without perforation or abscess without bleeding: Secondary | ICD-10-CM

## 2012-11-27 DIAGNOSIS — M25562 Pain in left knee: Secondary | ICD-10-CM

## 2012-11-27 DIAGNOSIS — R002 Palpitations: Secondary | ICD-10-CM

## 2012-11-27 DIAGNOSIS — Z86718 Personal history of other venous thrombosis and embolism: Secondary | ICD-10-CM

## 2012-11-27 DIAGNOSIS — Z833 Family history of diabetes mellitus: Secondary | ICD-10-CM

## 2012-11-27 DIAGNOSIS — M79604 Pain in right leg: Secondary | ICD-10-CM

## 2012-11-27 DIAGNOSIS — M1990 Unspecified osteoarthritis, unspecified site: Secondary | ICD-10-CM

## 2012-11-27 DIAGNOSIS — N952 Postmenopausal atrophic vaginitis: Secondary | ICD-10-CM

## 2012-11-27 DIAGNOSIS — M1710 Unilateral primary osteoarthritis, unspecified knee: Secondary | ICD-10-CM | POA: Diagnosis present

## 2012-11-27 DIAGNOSIS — J9601 Acute respiratory failure with hypoxia: Secondary | ICD-10-CM

## 2012-11-27 DIAGNOSIS — R079 Chest pain, unspecified: Secondary | ICD-10-CM

## 2012-11-27 DIAGNOSIS — D759 Disease of blood and blood-forming organs, unspecified: Secondary | ICD-10-CM

## 2012-11-27 DIAGNOSIS — A419 Sepsis, unspecified organism: Secondary | ICD-10-CM

## 2012-11-27 DIAGNOSIS — K750 Abscess of liver: Secondary | ICD-10-CM

## 2012-11-27 DIAGNOSIS — F341 Dysthymic disorder: Secondary | ICD-10-CM | POA: Diagnosis present

## 2012-11-27 DIAGNOSIS — F17200 Nicotine dependence, unspecified, uncomplicated: Secondary | ICD-10-CM

## 2012-11-27 DIAGNOSIS — F410 Panic disorder [episodic paroxysmal anxiety] without agoraphobia: Secondary | ICD-10-CM

## 2012-11-27 DIAGNOSIS — N760 Acute vaginitis: Secondary | ICD-10-CM

## 2012-11-27 DIAGNOSIS — R0902 Hypoxemia: Secondary | ICD-10-CM

## 2012-11-27 DIAGNOSIS — I4891 Unspecified atrial fibrillation: Secondary | ICD-10-CM

## 2012-11-27 DIAGNOSIS — R5383 Other fatigue: Secondary | ICD-10-CM

## 2012-11-27 DIAGNOSIS — I2699 Other pulmonary embolism without acute cor pulmonale: Secondary | ICD-10-CM

## 2012-11-27 DIAGNOSIS — I10 Essential (primary) hypertension: Secondary | ICD-10-CM

## 2012-11-27 DIAGNOSIS — G8918 Other acute postprocedural pain: Secondary | ICD-10-CM | POA: Diagnosis present

## 2012-11-27 DIAGNOSIS — J019 Acute sinusitis, unspecified: Secondary | ICD-10-CM

## 2012-11-27 DIAGNOSIS — M722 Plantar fascial fibromatosis: Secondary | ICD-10-CM

## 2012-11-27 DIAGNOSIS — B370 Candidal stomatitis: Secondary | ICD-10-CM

## 2012-11-27 DIAGNOSIS — J449 Chronic obstructive pulmonary disease, unspecified: Secondary | ICD-10-CM | POA: Diagnosis present

## 2012-11-27 DIAGNOSIS — R609 Edema, unspecified: Secondary | ICD-10-CM

## 2012-11-27 DIAGNOSIS — E119 Type 2 diabetes mellitus without complications: Secondary | ICD-10-CM

## 2012-11-27 DIAGNOSIS — E1140 Type 2 diabetes mellitus with diabetic neuropathy, unspecified: Secondary | ICD-10-CM | POA: Diagnosis present

## 2012-11-27 DIAGNOSIS — D72829 Elevated white blood cell count, unspecified: Secondary | ICD-10-CM

## 2012-11-27 DIAGNOSIS — E1169 Type 2 diabetes mellitus with other specified complication: Secondary | ICD-10-CM | POA: Diagnosis present

## 2012-11-27 DIAGNOSIS — M1712 Unilateral primary osteoarthritis, left knee: Secondary | ICD-10-CM

## 2012-11-27 HISTORY — DX: Unspecified atrial fibrillation: I48.91

## 2012-11-27 LAB — GLUCOSE, CAPILLARY
Glucose-Capillary: 132 mg/dL — ABNORMAL HIGH (ref 70–99)
Glucose-Capillary: 155 mg/dL — ABNORMAL HIGH (ref 70–99)

## 2012-11-27 LAB — CBC
Hemoglobin: 10 g/dL — ABNORMAL LOW (ref 12.0–15.0)
MCV: 86.3 fL (ref 78.0–100.0)
Platelets: 177 10*3/uL (ref 150–400)
RBC: 3.51 MIL/uL — ABNORMAL LOW (ref 3.87–5.11)
WBC: 8.7 10*3/uL (ref 4.0–10.5)

## 2012-11-27 LAB — CBC WITH DIFFERENTIAL/PLATELET
Basophils Absolute: 0 10*3/uL (ref 0.0–0.1)
Basophils Relative: 0 % (ref 0–1)
Eosinophils Absolute: 0.1 10*3/uL (ref 0.0–0.7)
Hemoglobin: 11.1 g/dL — ABNORMAL LOW (ref 12.0–15.0)
MCH: 28.3 pg (ref 26.0–34.0)
MCHC: 32.3 g/dL (ref 30.0–36.0)
Monocytes Absolute: 0.9 10*3/uL (ref 0.1–1.0)
Monocytes Relative: 8 % (ref 3–12)
Neutro Abs: 9.6 10*3/uL — ABNORMAL HIGH (ref 1.7–7.7)
Neutrophils Relative %: 80 % — ABNORMAL HIGH (ref 43–77)
RDW: 15.9 % — ABNORMAL HIGH (ref 11.5–15.5)

## 2012-11-27 LAB — PROTIME-INR
INR: 1.99 — ABNORMAL HIGH (ref 0.00–1.49)
Prothrombin Time: 21.8 seconds — ABNORMAL HIGH (ref 11.6–15.2)

## 2012-11-27 LAB — COMPREHENSIVE METABOLIC PANEL
AST: 22 U/L (ref 0–37)
Albumin: 2.8 g/dL — ABNORMAL LOW (ref 3.5–5.2)
BUN: 21 mg/dL (ref 6–23)
Creatinine, Ser: 0.92 mg/dL (ref 0.50–1.10)
Total Protein: 6.5 g/dL (ref 6.0–8.3)

## 2012-11-27 LAB — POCT I-STAT TROPONIN I: Troponin i, poc: 0.03 ng/mL (ref 0.00–0.08)

## 2012-11-27 LAB — CG4 I-STAT (LACTIC ACID): Lactic Acid, Venous: 1.74 mmol/L (ref 0.5–2.2)

## 2012-11-27 LAB — PRO B NATRIURETIC PEPTIDE: Pro B Natriuretic peptide (BNP): 957.5 pg/mL — ABNORMAL HIGH (ref 0–125)

## 2012-11-27 MED ORDER — FUROSEMIDE 10 MG/ML IJ SOLN
40.0000 mg | Freq: Once | INTRAMUSCULAR | Status: AC
  Administered 2012-11-27: 40 mg via INTRAVENOUS
  Filled 2012-11-27: qty 4

## 2012-11-27 MED ORDER — ALBUTEROL SULFATE (5 MG/ML) 0.5% IN NEBU
5.0000 mg | INHALATION_SOLUTION | Freq: Once | RESPIRATORY_TRACT | Status: AC
  Administered 2012-11-27: 5 mg via RESPIRATORY_TRACT
  Filled 2012-11-27: qty 1

## 2012-11-27 MED ORDER — IPRATROPIUM BROMIDE 0.02 % IN SOLN
0.5000 mg | Freq: Once | RESPIRATORY_TRACT | Status: AC
  Administered 2012-11-27: 0.5 mg via RESPIRATORY_TRACT
  Filled 2012-11-27: qty 2.5

## 2012-11-27 NOTE — Discharge Summary (Signed)
Patient ID: Sarah Mcguire MRN: [id number] DOB/AGE: [birth year] 72 y.o.  Admit date: 11/24/2012 Discharge date: 11/27/2012  Admission Diagnoses:  Principal Problem:   Osteoarthritis of left knee Active Problems:   DIABETES MELLITUS   Discharge Diagnoses:  Same  Past Medical History  Diagnosis Date  . Hyperlipidemia   . Hypertension   . Hyperglycemia   . GERD (gastroesophageal reflux disease)   . Anemia   . Leukocytosis   . Diverticulitis   . Neuropathy   . Anxiety     SEVERE  . 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 DVT (deep venous thrombosis)   . Diabetes mellitus     borderline no medications  . Arthritis     Surgeries: Procedure(s): TOTAL KNEE ARTHROPLASTY on 11/24/2012   Consultants:    Discharged Condition: Improved  Hospital Course: Sarah Mcguire is an 72 y.o. female who was admitted 11/24/2012 for operative treatment ofOsteoarthritis of left knee. Patient has severe unremitting pain that affects sleep, daily activities, and work/hobbies. After pre-op clearance the patient was taken to the operating room on 11/24/2012 and underwent  Procedure(s): TOTAL KNEE ARTHROPLASTY.    Patient was given perioperative antibiotics: Anti-infectives   Start     Dose/Rate Route Frequency Ordered Stop   11/24/12 1008  cefUROXime (ZINACEF) injection  Status:  Discontinued       As needed 11/24/12 1008 11/24/12 1111   11/23/12 0814  vancomycin (VANCOCIN) IVPB 1000 mg/200 mL premix  Status:  Discontinued     1,000 mg 200 mL/hr over 60 Minutes Intravenous On call to O.R. 11/23/12 4098 11/24/12 1408       Patient was given sequential compression devices, early ambulation, and chemoprophylaxis to prevent DVT.  Patient benefited maximally from hospital stay and there were no complications.    Recent vital signs: Patient Vitals for the past 24 hrs:  BP Temp Temp src Pulse  Resp SpO2  11/27/12 0626 94/55 mmHg 97.8 F (36.6 C) Oral 96 17 98 %  11/27/12 0400 - - - - 16 -  11/27/12 0000 - - - - 16 -  11/26/12 2106 110/68 mmHg 98.7 F (37.1 C) Oral 79 16 98 %  11/26/12 2000 - - - - 16 -  11/26/12 1610 105/58 mmHg 98.5 F (36.9 C) - 98 18 97 %     Recent laboratory studies:  Recent Labs  11/25/12 0630 11/26/12 0510 11/26/12 0913 11/27/12 0627  WBC 11.8* 13.9*  --  8.7  HGB 11.8* 11.3*  --  10.0*  HCT 35.8* 34.9*  --  30.3*  PLT 212 202  --  177  NA 134*  --  132*  --   K 5.3*  --  5.3*  --   CL 103  --  99  --   CO2 25  --  26  --   BUN 22  --  15  --   CREATININE 1.09  --  0.90  --   GLUCOSE 134*  --  146*  --   INR 1.15 1.87*  --  2.10*  CALCIUM 9.0  --  9.0  --      Discharge Medications:     Medication List    STOP taking these medications       acetaminophen 325 MG tablet  Commonly known as:  TYLENOL     HYDROcodone-acetaminophen 5-500 MG per tablet  Commonly known as:  VICODIN      TAKE these medications       ADVIL ALLERGY SINUS PO  Take 2 tablets by mouth daily as needed (for sinus congestion).     alprazolam 2 MG tablet  Commonly known as:  XANAX  Take 0.5-1 tablets (1-2 mg total) by mouth 3 (three) times daily as needed. For anxiety/sleep.     amLODipine 5 MG tablet  Commonly known as:  NORVASC  Take 1 tablet (5 mg total) by mouth daily.     benazepril 20 MG tablet  Commonly known as:  LOTENSIN  Take 1 tablet (20 mg total) by mouth daily.     CALCIUM + D PO  Take 1 tablet by mouth daily.     CINNAMON PO  Take 2,000 mg by mouth every 8 (eight) hours as needed (for blood sugar control).     enoxaparin 80 MG/0.8ML injection  Commonly known as:  LOVENOX  Inject into the skin.     esomeprazole 40 MG capsule  Commonly known as:  NEXIUM  Take 40 mg by mouth daily as needed. For acid reflux.     fenofibrate 160 MG tablet  Take 1 tablet (160 mg total) by mouth daily.     folic acid 1 MG tablet  Commonly  known as:  FOLVITE  Take 1 mg by mouth daily.     HYDROcodone-acetaminophen 5-325 MG per tablet  Commonly known as:  NORCO/VICODIN  Take 1-2 tablets by mouth every 4 (four) hours as needed.     multivitamin tablet  Take 1 tablet by mouth daily.     NASAL SPRAY NA  Place 1 spray into the nose daily as needed (for sinus congestion).     simvastatin 20 MG tablet  Commonly known as:  ZOCOR  Take 20 mg by mouth every evening.     Vitamin D 2000 UNITS tablet  Take 2,000 Units by mouth daily.     warfarin 5 MG tablet  Commonly known as:  COUMADIN  Take 5 mg by mouth See admin instructions. Patient takes 5 mg daily, except for Wednesdays-takes 2.5mg .        Diagnostic Studies: No results found.  Disposition: 01-Home or Self Care      Discharge Orders   Future Appointments Provider Department Dept Phone   12/02/2012 1:15 PM [facility] [phone]   01/05/2013 8:00 AM Sheliah Hatch, MD [facility]  [place] [phone]   Future Orders Complete By Expires     Call MD for:  redness, tenderness, or signs of infection (pain, swelling, redness, odor or green/yellow discharge around incision site)  As directed     Call MD for:  severe uncontrolled pain  As directed     Call MD for:  temperature >100.4  As directed     Change dressing (specify)  As directed     Comments:      Dressing change as needed.    Discharge instructions  As directed     Comments:      F/U with Dr. Turner Daniels as scheduled (POD #14)    Driving Restrictions  As directed     Comments:      No driving for 2 weeks.    Increase activity slowly  As directed     May shower / Bathe  As directed     Walker   As directed           Signed: [employer]  M. 11/27/2012, 9:43 AM

## 2012-11-27 NOTE — Progress Notes (Signed)
Physical Therapy Treatment Patient Details Name: Sarah Mcguire MRN: [id number] DOB: 11-22-1940 Today's Date: 11/27/2012 Time: 4098-1191 PT Time Calculation (min): 25 min  PT Assessment / Plan / Recommendation Comments on Treatment Session  Patients main goal this session was the step. Patient declined increased ambulation and therex stating she just wanted to go home. MD aware and writing up discharge information    Follow Up Recommendations  Home health PT;Supervision/Assistance - 24 hour     Does the patient have the potential to tolerate intense rehabilitation     Barriers to Discharge        Equipment Recommendations  None recommended by PT    Recommendations for Other Services    Frequency 7X/week   Plan Discharge plan remains appropriate;Frequency remains appropriate    Precautions / Restrictions Precautions Precautions: Knee Restrictions LLE Weight Bearing: Weight bearing as tolerated   Pertinent Vitals/Pain O2 was 98% RA while ambulating    Mobility  Bed Mobility Bed Mobility: Not assessed Transfers Sit to Stand: 4: Min assist;With upper extremity assist;With armrests;From chair/3-in-1 Stand to Sit: With upper extremity assist;4: Min guard;To chair/3-in-1 Details for Transfer Assistance: Cues for standing, to lean forward and shift weight.  Ambulation/Gait Ambulation/Gait Assistance: 4: Min guard Ambulation Distance (Feet): 70 Feet Assistive device: Rolling walker Ambulation/Gait Assistance Details: Cues for posture adn to keep RW closer. Patient stating out today that she felt she was giong to fall, max encouragment to continue Gait velocity: decreased Stairs: Yes Stairs Assistance: 4: Min assist Stairs Assistance Details (indicate cue type and reason): Cues for sequence and technique Stair Management Technique: Forwards;With walker Number of Stairs: 1    Exercises     PT Diagnosis:    PT Problem List:   PT Treatment Interventions:     PT  Goals Acute Rehab PT Goals PT Goal: Sit to Stand - Progress: Progressing toward goal PT Goal: Stand to Sit - Progress: Progressing toward goal PT Goal: Ambulate - Progress: Progressing toward goal PT Goal: Up/Down Stairs - Progress: Progressing toward goal  Visit Information  Last PT Received On: 11/27/12 Assistance Needed: +1    Subjective Data      Cognition  Cognition Overall Cognitive Status: Appears within functional limits for tasks assessed/performed Arousal/Alertness: Awake/alert Orientation Level: Appears intact for tasks assessed Behavior During Session: [facility] for tasks performed Cognition - Other Comments: very anxious    Balance     End of Session PT - End of Session Equipment Utilized During Treatment: Gait belt Activity Tolerance: Patient tolerated treatment well;Patient limited by fatigue Patient left: in chair Nurse Communication: Mobility status   GP     Fredrich Birks 11/27/2012, 9:13 AM  11/27/2012 Fredrich Birks PTA [phone] pager [phone] office

## 2012-11-27 NOTE — H&P (Signed)
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 Patient was in extreme pain and was unable to participate in home health physical therapy. She called the office of her orthopedics and was told to come to the ER to get evaluated. Patient denies any chest pain, palpitations, abdominal pain, and said no to all other questions asked pertaining to review of system. She does complain of left knee pain. She does have some shortness of breath.  When speaking with the husband he states that he feels she needs to get her rehabilitation and can't take care of herself at home.   ER evaluation suggests that patient has atrial fibrillation with rapid her ventricular response with heart rate in the 130s. Patient's chest x-ray was also suggestive of mild congestive heart failure and her proBNP was 950. Hospitalist team was called to admit the patient.   Review of Systems:  Positive for shortness of breath and left knee pain. 15 point review of system was negative except for as noted above.  Past Medical History  Diagnosis Date  . Hyperlipidemia   . Hypertension   . Hyperglycemia   . GERD (gastroesophageal reflux disease)   . Anemia   . Leukocytosis   . Diverticulitis   . Neuropathy   . Anxiety     SEVERE  . 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 DVT (deep venous thrombosis)   . Diabetes mellitus      borderline no medications  . Arthritis   . COPD (chronic obstructive pulmonary disease)   . Atrial fibrillation with RVR 11/28/2012    Past Surgical History  Procedure Laterality Date  . Tonsillectomy    . Tympanostomy tube placement      R ear  . Total knee arthroplasty Left 11/24/2012    Procedure: TOTAL KNEE ARTHROPLASTY;  Surgeon: [name], MD;  Location: [facility] OR;  Service: Orthopedics;  Laterality: Left;    Social History:  reports that she has been smoking Cigarettes.  She has been smoking about 0.80 packs per day. She has never used smokeless tobacco. She reports that she does not drink alcohol or use illicit drugs.  Allergies  Allergen Reactions  . Morphine And Related     Usually drops her blood pressure  . Amoxicillin Other (See Comments)    Unknown   . Amoxicillin-Pot Clavulanate Other (See Comments)    Unknown   . Citalopram Hydrobromide Other (See Comments)    Itching   . Clonazepam Itching  . Cymbalta (Duloxetine Hcl) Other (See Comments)    Nausea, headache and diarrhea   . Macrobid (Nitrofurantoin Macrocrystal) Other (See Comments)    Nausea, vomitting  . Penicillins Other (See Comments)    Unknown   . Pyridium (Phenazopyridine Hcl) Other (See Comments)    headache  . Zonegran Other (See Comments)    Unknown   . Zonisamide Other (See Comments)    Unknown   . Sertraline Hcl Anxiety  . Wellbutrin (Bupropion) Itching and Rash  Itching, rash, hyper     Family History  Problem Relation Age of Onset  . Diabetes Mother   . Hypertension Mother   . Heart disease Father     MI age 52's   . Diabetes Brother   . Hypertension Brother      Prior to Admission medications   Medication Sig Start Date End Date Taking? Authorizing Provider  alprazolam Prudy Feeler) 2 MG tablet Take 0.5-1 tablets (1-2 mg total) by mouth 3 (three) times daily as needed. For anxiety/sleep. 10/08/12  Yes Sheliah Hatch, MD  amLODipine (NORVASC) 5 MG tablet Take 1 tablet (5  mg total) by mouth daily. 08/26/12  Yes Sheliah Hatch, MD  benazepril (LOTENSIN) 20 MG tablet Take 1 tablet (20 mg total) by mouth daily. 02/29/12  Yes Sheliah Hatch, MD  Calcium Carbonate-Vitamin D (CALCIUM + D PO) Take 1 tablet by mouth daily.   Yes Historical Provider, MD  Cholecalciferol (VITAMIN D) 2000 UNITS tablet Take 2,000 Units by mouth daily.   Yes Historical Provider, MD  CINNAMON PO Take 2,000 mg by mouth every 8 (eight) hours as needed (for blood sugar control).   Yes Historical Provider, MD  esomeprazole (NEXIUM) 40 MG capsule Take 40 mg by mouth daily as needed. For acid reflux. 07/09/11  Yes Sheliah Hatch, MD  fenofibrate 160 MG tablet Take 1 tablet (160 mg total) by mouth daily. 07/16/12  Yes Sheliah Hatch, MD  folic acid (FOLVITE) 1 MG tablet Take 1 mg by mouth daily.   Yes Historical Provider, MD  HYDROcodone-acetaminophen (NORCO/VICODIN) 5-325 MG per tablet Take 1-2 tablets by mouth every 4 (four) hours as needed. 11/26/12  Yes Alessandra Bevels. Mayford Knife, PA-C  Multiple Vitamin (MULTIVITAMIN) tablet Take 1 tablet by mouth daily.     Yes Historical Provider, MD  Oxymetazoline HCl (NASAL SPRAY NA) Place 1 spray into the nose daily as needed (for sinus congestion).   Yes Historical Provider, MD  simvastatin (ZOCOR) 20 MG tablet Take 20 mg by mouth every evening.   Yes Historical Provider, MD  warfarin (COUMADIN) 5 MG tablet Take 5 mg by mouth See admin instructions. Patient takes 5 mg daily, except for Wednesdays-takes 2.5mg .   Yes Historical Provider, MD   Physical Exam: Filed Vitals:   11/27/12 2047 11/27/12 2223 11/27/12 2230 11/27/12 2315  BP: 113/62 103/80 114/60 118/52  Pulse: 113 125 122 126  Temp: 98.6 F (37 C)     TempSrc: Oral     Resp: 16 18 22 23   SpO2:  85% 88% 89%   Constitutional: She appears well-developed and well-nourished. She appears distressed.   very anxious and keeps stating she is going to die.  HENT: Head: Normocephalic and atraumatic.  Mouth/Throat: Oropharynx is clear and moist. Mucous membranes are dry.  Eyes: Conjunctivae and EOM are normal. Pupils are equal, round, and reactive to light.  Neck: Normal range of motion. Neck supple.  Cardiovascular: Irregularly irregular rhythm with tachycardia and intact distal pulses. No murmur heard.Faint distal pulses in all extremities  Pulmonary/Chest: Tachypnea noted. Some respiratory distress. She has wheezes. She has rales bilaterally at the bases. Abdominal: Soft. She exhibits no distension. There is no tenderness. There is no rebound and no guarding.  Musculoskeletal: Normal range of motion. She exhibits edema and tenderness. Fresh surgical wound on the left knee. Swelling and mild warmth but no erythema or drainage.  Neurological: She is alert. Grossly normal  Skin: Skin is warm and dry. No rash noted. No  erythema.  Psychiatric: Patient has extremely anxious mood   Wt Readings from Last 3 Encounters:  11/21/12 187 lb 13.3 oz (85.2 kg)  11/10/12 193 lb (87.544 kg)  11/03/12 186 lb (84.369 kg)    Labs on Admission:  Basic Metabolic Panel:  Recent Labs Lab 11/21/12 1130 11/24/12 1437 11/25/12 0630 11/26/12 0913 11/27/12 2153  NA 137  --  134* 132* 135  K 5.1  --  5.3* 5.3* 4.9  CL 100  --  103 99 101  CO2 28  --  25 26 24   GLUCOSE 143*  --  134* 146* 171*  BUN 35*  --  22 15 21   CREATININE 0.81 0.99 1.09 0.90 0.92  CALCIUM 10.6*  --  9.0 9.0 10.0    Liver Function Tests:  Recent Labs Lab 11/27/12 2153  AST 22  ALT 21  ALKPHOS 50  BILITOT 0.3  PROT 6.5  ALBUMIN 2.8*   CBC:  Recent Labs Lab 11/21/12 1130 11/24/12 1437 11/25/12 0630 11/26/12 0510 11/27/12 0627 11/27/12 2153  WBC 12.5* 22.7* 11.8* 13.9* 8.7 12.0*  NEUTROABS 7.8*  --   --   --   --  9.6*  HGB 15.5* 13.6 11.8* 11.3* 10.0* 11.1*  HCT 46.1* 41.7 35.8* 34.9* 30.3* 34.4*  MCV 86.7 88.2 86.5 87.5 86.3 87.8  PLT 341 262 212 202 177 226    Troponin (Point of Care Test)  Recent  Labs  11/27/12 2216  TROPIPOC 0.03    BNP (last 3 results)  Recent Labs  02/13/12 0915 11/27/12 2154  PROBNP 187.9* 957.5*    CBG:  Recent Labs Lab 11/26/12 1125 11/26/12 1608 11/26/12 2103 11/27/12 0650 11/27/12 2220  GLUCAP 120* 119* 116* 132* 155*     Radiological Exams on Admission: Dg Chest Port 1 View  11/27/2012  *RADIOLOGY REPORT*  Clinical Data: Hypoxia.  Weakness.  Wheezing.  PORTABLE CHEST - 1 VIEW  Comparison: 06/26/2012.  Findings: Low volume chest with elevation of the left hemidiaphragm.  Basilar atelectasis.  The cardiopericardial silhouette is upper limits of normal for projection.  There is pulmonary vascular congestion.  Interstitial pulmonary edema at the lung bases is compatible with mild CHF.  Aortic arch atherosclerosis.  IMPRESSION: Low volume chest with basilar interstitial pulmonary edema, most compatible with mild CHF.  No focal consolidation.  Basilar atelectasis associated with low volumes.   Original Report Authenticated By: Andreas Newport, M.D.     EKG: Independently reviewed. 137 beats per minute, irregularly irregular rhythm, atrial fibrillation, nonspecific ST and T wave changes noted   Principal Problem:   Atrial fibrillation with RVR Active Problems:   Acute respiratory failure with hypoxia   DIABETES MELLITUS   HYPERLIPIDEMIA   OBESITY   ANEMIA   Anxiety and depression   HYPERTENSION   CARDIOMYOPATHY, DILATED   HEART FAILURE, CONGESTIVE UNSPEC   BACK PAIN, CHRONIC   HEART MURMUR, SYSTOLIC   DJD (degenerative joint disease)   Long term (current) use of anticoagulants   Assessment/Plan Patient is a 72 year old female with past medical history as noted above who was discharged earlier today after left knee replacement 4 days ago. Patient primarily came in with extreme left knee pain but was found to be in atrial fibrillation with RVR in the hospital.  A. fib with RVR: New onset atrial fibrillation. Patient does have a history  of dilated cardiomyopathy with last echocardiogram suggesting ejection fraction 45-50%. Acute right-sided heart strain from another pulmonary embolism from recent immobilization and surgery might  have precipitated but this cannot be probed at this time. New onset atrial fibrillation is reducing patient's already reduced ejection fraction and hence causing fluid overload. ACS cannot be completely ruled out as a cause. -Admit to step down unit -Start the patient on Cardizem drip -Start the patient on Lasix 40 mg IV twice daily -Monitor ins and outs closely -Normal saline lock -2-D echocardiogram to look for right heart strain although this would not change the management much -Continue Coumadin per pharmacy(patient's INR was 1.99) -Close Pressure monitoring -Cardiac enzymes x3 -Repeat EKG in the morning   Acute respiratory failure with hypoxia: Likely secondary to congestive heart failure/volume overload from being in atrial fibrillation with rapid ventricular response. -Continue oxygen therapy to keep oxygen saturation above 90 -Lasix -ABG -As above  Anxiety and depression: Continue Ativan 1 mg every 6 hours as needed IV  Diabetes type 2: Sliding scale insulin  Hypertension, hyperlipidemia: Hold antihypertensives at this time. Hold antihyperlipidemics  Long-term use of anticoagulants for PE -Coumadin per pharmacy     Code Status: Full code Family Communication: Family updated at bedside Disposition Plan/Anticipated LOS: Guarded at this time  Time spent: 75 minutes  Lars Mage, MD  [facility] Team 5  If 7PM-7AM, please contact night-coverage at [email or web address], password [facility] 11/28/2012, 12:16 AM

## 2012-11-27 NOTE — ED Notes (Signed)
Pt placed on 2L of O2. Pt stats have not risen above 85% RA. EDP aware. pt very anxious, continues to have rapid shallow breathing then periods where s continues to close her eyes and when she does her stats drop as in sleeping. Pt easily arousable and states that she is not sleeping. Pt told to take slow deep breaths.

## 2012-11-27 NOTE — ED Notes (Signed)
Per EMS: pt had knee replacement a couple of weeks ago and she has been on pain medications. Pt called EMS earlier today for breathing difficulty. Pt found with what appeared to be too much pain medications, EMS got pt to eat and call MD that did knee replacement Gean Birchwood who said to bring her in is she was having pain. Pt did not want to go at that time. An hour later called EMS for pain to knee.

## 2012-11-27 NOTE — ED Notes (Signed)
Pt husband at bedside. Pt took coumadin medications this morning. Pt husband states that MD was looking to place pt in rehab. Pt was just released from hospital today after being in hospital for 3 days with a left knee replacement.

## 2012-11-27 NOTE — ED Notes (Signed)
IV team at bedside with phelbotomy to collect blood and start IV.

## 2012-11-27 NOTE — ED Notes (Signed)
Went in to do repeat EKG, pt sleeping, pt easily arousable, pt alert and oriented, pt falls back asleep between questions, pt husband states that she has pain medication on broad from home. Pt denies pain at this time and falls back to sleep. When pt falls asleep pt stats drop to upper 80's.

## 2012-11-27 NOTE — ED Provider Notes (Addendum)
History     CSN: [id number]  Arrival date & time 11/27/12  2037   First MD Initiated Contact with Patient 11/27/12 2040      Chief Complaint  Patient presents with  . Knee Pain    (Consider location/radiation/quality/duration/timing/severity/associated sxs/prior treatment) HPI Comments: Patient answers very few questions and is moaning in bed stating she is given diet. She keeps asking for her husband repeatedly and states she just wants to see him one more time. Based on her history she had a knee replacement done 4 days ago and was discharged from the hospital today to her home. She states she was getting IV pain medication here and she felt it was too strong. She was discharged home on Vicodin which she's been taking and she called EMS due to severe knee pain. However she tells me that she thinks the pain medication is too strong but is complaining of severe knee pain. She states her blood pressure has been low since leaving the hospital but that is not normal for her. She denies fever or, chest pain, abdominal pain, nausea or vomiting but does have some shortness of breath.  When speaking with the husband he states that he feels she needs to get her rehabilitation and can't take care of herself at home  The history is provided by the patient and the spouse. The history is limited by the condition of the patient.    Past Medical History  Diagnosis Date  . Hyperlipidemia   . Hypertension   . Hyperglycemia   . GERD (gastroesophageal reflux disease)   . Anemia   . Leukocytosis   . Diverticulitis   . Neuropathy   . Anxiety     SEVERE  . 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 DVT (deep venous thrombosis)   . Diabetes mellitus     borderline no medications  . Arthritis     Past Surgical History  Procedure Laterality Date  . Tonsillectomy    . Tympanostomy tube placement       R ear  . 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 Diabetes Mother   . Hypertension Mother   . Heart disease Father     MI age 57's   . Diabetes Brother   . Hypertension Brother     History  Substance Use Topics  . Smoking status: Current Every Day Smoker -- 0.80 packs/day    Types: Cigarettes  . Smokeless tobacco: Never Used  . Alcohol Use: No    OB History   Grav Para Term Preterm Abortions TAB SAB Ect Mult Living   2 2 2       2       Review of Systems  Unable to perform ROS Constitutional: Negative for fever.  Respiratory: Positive for shortness of breath. Negative for cough and choking.   Gastrointestinal: Negative for nausea, vomiting and diarrhea.  Musculoskeletal:       Severe left knee pain from recent surgery    Allergies  Morphine and related; Amoxicillin; Amoxicillin-pot clavulanate; Citalopram hydrobromide; Clonazepam; Cymbalta; Macrobid; Penicillins; Pyridium; Zonegran; Zonisamide; Sertraline hcl; and Wellbutrin  Home Medications   Current Outpatient Rx  Name  Route  Sig  Dispense  Refill  . alprazolam (XANAX) 2 MG tablet   Oral   Take 0.5-1 tablets (1-2 mg total) by mouth 3 (three) times daily as needed. For anxiety/sleep.   90 tablet   3   . amLODipine (NORVASC) 5 MG tablet   Oral   Take 1 tablet (5 mg total) by mouth daily.   90 tablet   3   . benazepril (LOTENSIN) 20 MG tablet   Oral   Take 1 tablet (20 mg total) by mouth daily.   90 tablet   3   . Calcium Carbonate-Vitamin D (CALCIUM + D PO)   Oral   Take 1 tablet by mouth daily.         . Chlorpheniramine-PSE-Ibuprofen (ADVIL ALLERGY SINUS PO)   Oral   Take 2 tablets by mouth daily as needed (for sinus congestion).          . Cholecalciferol (VITAMIN D) 2000 UNITS tablet   Oral   Take 2,000  Units by mouth daily.         Marland Kitchen CINNAMON PO   Oral   Take 2,000 mg by mouth every 8 (eight) hours as needed (for blood sugar control).         . enoxaparin (LOVENOX) 80 MG/0.8ML injection   Subcutaneous   Inject into the skin.         Marland Kitchen esomeprazole (NEXIUM) 40 MG capsule   Oral   Take 40 mg by mouth daily as needed. For acid reflux.         . fenofibrate 160 MG tablet   Oral   Take 1 tablet (160 mg total) by mouth daily.   90 tablet   1   . folic acid (FOLVITE) 1 MG tablet   Oral   Take 1 mg by mouth daily.         Marland Kitchen HYDROcodone-acetaminophen (NORCO/VICODIN) 5-325 MG per tablet   Oral   Take 1-2 tablets by mouth every 4 (four) hours as needed.   60 tablet   0   . Multiple Vitamin (MULTIVITAMIN) tablet   Oral   Take 1 tablet by mouth daily.           . Oxymetazoline HCl (NASAL SPRAY NA)   Nasal   Place 1 spray into the nose daily as needed (for sinus congestion).         . simvastatin (ZOCOR) 20 MG tablet   Oral   Take 20 mg by mouth every evening.         . warfarin (COUMADIN) 5 MG tablet   Oral   Take 5 mg by mouth See admin instructions. Patient takes 5 mg daily, except for Wednesdays-takes 2.5mg .           BP 113/62  Pulse 113  Temp(Src) 98.6 F (37 C) (Oral)  Resp 16  Physical Exam  Nursing note and vitals reviewed. Constitutional: She appears well-developed and well-nourished. She appears distressed.  Keeps stating she is going to die and just wants to see her husband one more time.  HENT:  Head: Normocephalic and atraumatic.  Mouth/Throat: Oropharynx is clear and moist. Mucous membranes are dry.  Eyes: Conjunctivae and EOM are normal. Pupils are equal, round, and reactive to light.  Neck: Normal range of motion. Neck supple.  Cardiovascular: Regular rhythm and intact distal pulses.  Tachycardia present.   No murmur heard. Faint distal pulses in all extremities  Pulmonary/Chest: Tachypnea noted. No respiratory distress. She has  wheezes. She has rales.  Abdominal: Soft. She exhibits no distension. There is no tenderness. There is no rebound and no guarding.  Musculoskeletal: Normal range of motion. She exhibits edema and tenderness.  Fresh surgical wound on the left knee.  Swelling and mild warmth but no erythema or drainage.  Neurological: She is alert.  Pt makes some inappropiate comments that don't make sense.  Skin: Skin is warm and dry. No rash noted. No erythema.  Psychiatric: She has a normal mood and affect. Her behavior is normal.    ED Course  Procedures (including critical care time)  Labs Reviewed  CBC WITH DIFFERENTIAL - Abnormal; Notable for the following:    WBC 12.0 (*)    Hemoglobin 11.1 (*)    HCT 34.4 (*)    RDW 15.9 (*)    Neutrophils Relative 80 (*)    Neutro Abs 9.6 (*)    All other components within normal limits  COMPREHENSIVE METABOLIC PANEL - Abnormal; Notable for the following:    Glucose, Bld 171 (*)    Albumin 2.8 (*)    GFR calc non Af Amer 61 (*)    GFR calc Af Amer 70 (*)    All other components within normal limits  PRO B NATRIURETIC PEPTIDE - Abnormal; Notable for the following:    Pro B Natriuretic peptide (BNP) 957.5 (*)    All other components within normal limits  PROTIME-INR - Abnormal; Notable for the following:    Prothrombin Time 21.8 (*)    INR 1.99 (*)    All other components within normal limits  GLUCOSE, CAPILLARY - Abnormal; Notable for the following:    Glucose-Capillary 155 (*)    All other components within normal limits  CG4 I-STAT (LACTIC ACID)  POCT I-STAT TROPONIN I   Dg Chest Port 1 View  11/27/2012  *RADIOLOGY REPORT*  Clinical Data: Hypoxia.  Weakness.  Wheezing.  PORTABLE CHEST - 1 VIEW  Comparison: 06/26/2012.  Findings: Low volume chest with elevation of the left hemidiaphragm.  Basilar atelectasis.  The cardiopericardial silhouette is upper limits of normal for projection.  There is pulmonary vascular congestion.  Interstitial pulmonary  edema at the lung bases is compatible with mild CHF.  Aortic arch atherosclerosis.  IMPRESSION: Low volume chest with basilar interstitial pulmonary edema, most compatible with mild CHF.  No focal consolidation.  Basilar atelectasis associated with low volumes.   Original Report Authenticated By: Andreas Newport, M.D.      Date: 11/27/2012  Rate: 138  Rhythm: sinus tachycardia with irregular rhythm  QRS Axis: normal  Intervals: normal  ST/T Wave abnormalities: nonspecific T wave changes  Conduction Disutrbances:none  Narrative Interpretation:   Old EKG Reviewed: unchanged   Date: 11/27/2012  Rate: 133  Rhythm: sinus tachycardia with irregular rhythm  QRS Axis: normal  Intervals: normal  ST/T Wave abnormalities: normal  Conduction Disutrbances:none  Narrative Interpretation:   Old EKG Reviewed: unchanged    1. CHF (congestive heart failure)   2. Hypoxia   3. Tachycardia       MDM   Patient recently had a knee replacement done approximately 4 days ago and was discharged home today. She is receiving IV pain medication here and was sent home on Vicodin. EMS was called out to the house due to severe left knee pain over the surgical sites however when patient arrived here she is tachycardic, hypotensive and hypoxic. She did not make a lot of sense and states that she thinks the medication is too strong but she is  complaining of severe knee pain. Husband feels the patient needs rehabilitation and is not able to tolerate being at home. She's been on anticoagulation but does have a history of PEs And DVT. No prior cardiac history such as CHF however she is wheezing and has rales in all lung fields which is concerning for possible fluid overload. She denies any chest pain but was satting 82% on arrival here her off oxygen with a heart rate of 138. EKG shows a regular sinus tachycardia which is basically unchanged from prior.  Patient's knee is swollen and mildly warm but no erythema  present  Concern for fluid overload versus clots versus infectious etiology.  CBC, CMP, BNP, INR, troponin, lactic acid, chest x-ray pending.  11:02 PM Labs are stable with normal lactic acid and INR is 1.99 and negative troponin. BNP is elevated at 900 today and chest x-ray shows interstitial pulmonary edema compatible with mild CHF. Wheezing improved after albuterol and Atrovent however in speaking with husband patient does not take inhalers regularly. EKG does show P waves on most beats there is irregularity of the feet. Patient's blood pressure has remained greater than 100 systolic and given recent surgery and signs of fluid overload we'll treat with Lasix. Patient's oxygen saturations are in the 90s on 3 L however when she is taken off 3 L she drops down to 85%.  No signs of anemia hb stable at 11.  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 Will admit for further care.     Gwyneth Sprout, MD 11/27/12 4540  Gwyneth Sprout, MD 11/27/12 9811  Gwyneth Sprout, MD 11/27/12 2341

## 2012-11-27 NOTE — ED Notes (Signed)
IV team paged. 2 Rn's attempted IV start without success.

## 2012-11-27 NOTE — Progress Notes (Signed)
PATIENT ID: Sarah Mcguire  MRN: [id number]  DOB/AGE:  10/09/1940 / 72 y.o.  3 Days Post-Op Procedure(s) (LRB): TOTAL KNEE ARTHROPLASTY (Left)    PROGRESS NOTE Subjective: Patient is alert, oriented, no Nausea, no Vomiting, yes passing gas, no Bowel Movement. Taking PO well. Denies SOB, Chest or Calf Pain. Using Incentive Spirometer, PAS in place. Ambulating well with PT, did stairs. Patient reports pain as moderate  .    Objective: Vital signs in last 24 hours: Filed Vitals:   11/26/12 2106 11/27/12 0000 11/27/12 0400 11/27/12 0626  BP: 110/68   94/55  Pulse: 79   96  Temp: 98.7 F (37.1 C)   97.8 F (36.6 C)  TempSrc: Oral   Oral  Resp: 16 16 16 17   SpO2: 98%   98%      Intake/Output from previous day: I/O last 3 completed shifts: In: 240 [P.O.:240] Out: -    Intake/Output this shift: Total I/O In: 240 [P.O.:240] Out: -    LABORATORY DATA:  Recent Labs  11/25/12 0630  11/26/12 0510 11/26/12 0913  11/26/12 1608 11/26/12 2103 11/27/12 0627 11/27/12 0650  WBC 11.8*  --  13.9*  --   --   --   --  8.7  --   HGB 11.8*  --  11.3*  --   --   --   --  10.0*  --   HCT 35.8*  --  34.9*  --   --   --   --  30.3*  --   PLT 212  --  202  --   --   --   --  177  --   NA 134*  --   --  132*  --   --   --   --   --   K 5.3*  --   --  5.3*  --   --   --   --   --   CL 103  --   --  99  --   --   --   --   --   CO2 25  --   --  26  --   --   --   --   --   BUN 22  --   --  15  --   --   --   --   --   CREATININE 1.09  --   --  0.90  --   --   --   --   --   GLUCOSE 134*  --   --  146*  --   --   --   --   --   GLUCAP  --   < >  --   --   < > 119* 116*  --  132*  INR 1.15  --  1.87*  --   --   --   --  2.10*  --   CALCIUM 9.0  --   --  9.0  --   --   --   --   --   < > = values in this interval not displayed.  Examination: Neurologically intact ABD soft Neurovascular intact Sensation intact distally Intact pulses distally Dorsiflexion/Plantar flexion intact Incision:  dressing C/D/I}  Assessment:   3 Days Post-Op Procedure(s) (LRB): TOTAL KNEE ARTHROPLASTY (Left) ADDITIONAL DIAGNOSIS:  none  Plan: PT/OT WBAT, CPM 5/hrs day until ROM 0-90 degrees, then D/C CPM DVT Prophylaxis:  SCDx72hrs, ASA 325  mg BID x 2 weeks DISCHARGE PLAN: Home today DISCHARGE NEEDS: HHPT, HHRN, CPM, Walker and 3-in-1 comode seat     Gloriana Piltz M. 11/27/2012, 9:41 AM

## 2012-11-28 ENCOUNTER — Encounter (HOSPITAL_COMMUNITY): Payer: Self-pay | Admitting: Internal Medicine

## 2012-11-28 DIAGNOSIS — I509 Heart failure, unspecified: Secondary | ICD-10-CM

## 2012-11-28 DIAGNOSIS — I517 Cardiomegaly: Secondary | ICD-10-CM

## 2012-11-28 DIAGNOSIS — M25569 Pain in unspecified knee: Secondary | ICD-10-CM

## 2012-11-28 DIAGNOSIS — J9600 Acute respiratory failure, unspecified whether with hypoxia or hypercapnia: Secondary | ICD-10-CM

## 2012-11-28 DIAGNOSIS — I4891 Unspecified atrial fibrillation: Secondary | ICD-10-CM

## 2012-11-28 HISTORY — DX: Unspecified atrial fibrillation: I48.91

## 2012-11-28 LAB — BLOOD GAS, ARTERIAL
Bicarbonate: 24.3 mEq/L — ABNORMAL HIGH (ref 20.0–24.0)
Drawn by: 347641
FIO2: 0.28 %
O2 Saturation: 90.4 %
Patient temperature: 98.6
pH, Arterial: 7.443 (ref 7.350–7.450)

## 2012-11-28 LAB — GLUCOSE, CAPILLARY
Glucose-Capillary: 165 mg/dL — ABNORMAL HIGH (ref 70–99)
Glucose-Capillary: 138 mg/dL — ABNORMAL HIGH (ref 70–99)
Glucose-Capillary: 152 mg/dL — ABNORMAL HIGH (ref 70–99)
Glucose-Capillary: 124 mg/dL — ABNORMAL HIGH (ref 70–99)

## 2012-11-28 LAB — BASIC METABOLIC PANEL
BUN: 20 mg/dL (ref 6–23)
CO2: 24 mEq/L (ref 19–32)
Chloride: 101 mEq/L (ref 96–112)
Creatinine, Ser: 0.97 mg/dL (ref 0.50–1.10)
Glucose, Bld: 144 mg/dL — ABNORMAL HIGH (ref 70–99)

## 2012-11-28 LAB — CBC
HCT: 28.8 % — ABNORMAL LOW (ref 36.0–46.0)
MCHC: 32.6 g/dL (ref 30.0–36.0)
MCV: 87 fL (ref 78.0–100.0)
RDW: 15.6 % — ABNORMAL HIGH (ref 11.5–15.5)

## 2012-11-28 LAB — HEMOGLOBIN A1C: Mean Plasma Glucose: 148 mg/dL — ABNORMAL HIGH (ref <117)

## 2012-11-28 LAB — PROTIME-INR: Prothrombin Time: 22.7 seconds — ABNORMAL HIGH (ref 11.6–15.2)

## 2012-11-28 LAB — MRSA PCR SCREENING: MRSA by PCR: NEGATIVE

## 2012-11-28 MED ORDER — WARFARIN SODIUM 2.5 MG PO TABS: 2.5000 mg | ORAL_TABLET | ORAL | Status: DC

## 2012-11-28 MED ORDER — SENNA 8.6 MG PO TABS
1.0000 | ORAL_TABLET | Freq: Two times a day (BID) | ORAL | Status: DC
  Administered 2012-11-28 – 2012-12-01 (×7): 8.6 mg via ORAL
  Filled 2012-11-28 (×8): qty 1

## 2012-11-28 MED ORDER — ONDANSETRON HCL 4 MG/2ML IJ SOLN: 4.0000 mg | Freq: Four times a day (QID) | INTRAMUSCULAR | Status: DC | PRN

## 2012-11-28 MED ORDER — ONDANSETRON HCL 4 MG PO TABS: 4.0000 mg | ORAL_TABLET | Freq: Four times a day (QID) | ORAL | Status: DC | PRN

## 2012-11-28 MED ORDER — ALPRAZOLAM 0.5 MG PO TABS
1.0000 mg | ORAL_TABLET | Freq: Three times a day (TID) | ORAL | Status: DC | PRN
  Administered 2012-11-30 – 2012-12-01 (×2): 1 mg via ORAL
  Filled 2012-11-28: qty 2
  Filled 2012-11-28: qty 1
  Filled 2012-11-28: qty 2

## 2012-11-28 MED ORDER — SODIUM CHLORIDE 0.9 % IJ SOLN
3.0000 mL | INTRAMUSCULAR | Status: DC | PRN
  Administered 2012-11-28: 3 mL via INTRAVENOUS

## 2012-11-28 MED ORDER — WARFARIN SODIUM 5 MG PO TABS
5.0000 mg | ORAL_TABLET | ORAL | Status: DC
  Filled 2012-11-28: qty 1

## 2012-11-28 MED ORDER — SODIUM CHLORIDE 0.9 % IV SOLN: 250.0000 mL | INTRAVENOUS | Status: DC | PRN

## 2012-11-28 MED ORDER — WARFARIN SODIUM 5 MG PO TABS
5.0000 mg | ORAL_TABLET | ORAL | Status: DC
  Administered 2012-11-28: 5 mg via ORAL
  Filled 2012-11-28 (×2): qty 1

## 2012-11-28 MED ORDER — DILTIAZEM HCL ER COATED BEADS 120 MG PO CP24
120.0000 mg | ORAL_CAPSULE | Freq: Every day | ORAL | Status: DC
  Administered 2012-11-28 – 2012-12-01 (×4): 120 mg via ORAL
  Filled 2012-11-28 (×4): qty 1

## 2012-11-28 MED ORDER — INSULIN ASPART 100 UNIT/ML ~~LOC~~ SOLN: 0.0000 [IU] | Freq: Every day | SUBCUTANEOUS | Status: DC

## 2012-11-28 MED ORDER — SODIUM CHLORIDE 0.9 % IJ SOLN
3.0000 mL | Freq: Two times a day (BID) | INTRAMUSCULAR | Status: DC
  Administered 2012-11-28 – 2012-11-30 (×3): 3 mL via INTRAVENOUS

## 2012-11-28 MED ORDER — SODIUM CHLORIDE 0.9 % IJ SOLN
3.0000 mL | Freq: Two times a day (BID) | INTRAMUSCULAR | Status: DC
  Administered 2012-11-28 – 2012-11-30 (×5): 3 mL via INTRAVENOUS

## 2012-11-28 MED ORDER — PANTOPRAZOLE SODIUM 40 MG PO TBEC
80.0000 mg | DELAYED_RELEASE_TABLET | Freq: Every day | ORAL | Status: DC
  Administered 2012-11-28 – 2012-11-30 (×3): 80 mg via ORAL
  Filled 2012-11-28 (×2): qty 1
  Filled 2012-11-28: qty 2

## 2012-11-28 MED ORDER — DILTIAZEM HCL 100 MG IV SOLR
5.0000 mg/h | INTRAVENOUS | Status: DC
  Administered 2012-11-28: 10 mg/h via INTRAVENOUS
  Administered 2012-11-28: 5 mg/h via INTRAVENOUS
  Filled 2012-11-28: qty 100

## 2012-11-28 MED ORDER — INSULIN ASPART 100 UNIT/ML ~~LOC~~ SOLN
0.0000 [IU] | Freq: Three times a day (TID) | SUBCUTANEOUS | Status: DC
  Administered 2012-11-28: 3 [IU] via SUBCUTANEOUS
  Administered 2012-11-28 – 2012-11-29 (×3): 2 [IU] via SUBCUTANEOUS
  Administered 2012-11-29: 3 [IU] via SUBCUTANEOUS
  Administered 2012-11-30 – 2012-12-01 (×5): 2 [IU] via SUBCUTANEOUS

## 2012-11-28 MED ORDER — FUROSEMIDE 10 MG/ML IJ SOLN
40.0000 mg | Freq: Two times a day (BID) | INTRAMUSCULAR | Status: DC
  Administered 2012-11-28 – 2012-11-29 (×4): 40 mg via INTRAVENOUS
  Filled 2012-11-28 (×6): qty 4

## 2012-11-28 MED ORDER — WARFARIN - PHARMACIST DOSING INPATIENT: Freq: Every day | Status: DC

## 2012-11-28 MED ORDER — WARFARIN SODIUM 5 MG PO TABS
5.0000 mg | ORAL_TABLET | Freq: Every day | ORAL | Status: DC
  Filled 2012-11-28: qty 1

## 2012-11-28 MED ORDER — HYDROCODONE-ACETAMINOPHEN 5-325 MG PO TABS
1.0000 | ORAL_TABLET | ORAL | Status: DC | PRN
  Administered 2012-11-28: 0.5 via ORAL
  Administered 2012-11-29 – 2012-11-30 (×3): 1 via ORAL
  Administered 2012-12-01: 0.5 via ORAL
  Filled 2012-11-28 (×2): qty 1
  Filled 2012-11-28: qty 2
  Filled 2012-11-28 (×2): qty 1

## 2012-11-28 NOTE — Evaluation (Signed)
Physical Therapy Evaluation Patient Details Name: Sarah Mcguire MRN: [id number] DOB: Oct 06, 1940 Today's Date: 11/28/2012 Time: 4098-1191 PT Time Calculation (min): 45 min  PT Assessment / Plan / Recommendation Clinical Impression  Patient is a 72 yo female s/p Lt. TKA 11/23/12 who went home and was re-admitted to [facility] for extreme left knee pain and was found to be in A-fib with RVR, SOB- due to pulmonary edema.  Her HR has been in the 120s at rest and she was started on cardizeim and lasix.  She presents today with decreased mobility, decreased activity tolerance increased pain in her left knees and decreased ROM.  She and her husband are contemplating SNF for rehab before returning home.      PT Assessment  Patient needs continued PT services    Follow Up Recommendations  SNF    Does the patient have the potential to tolerate intense rehabilitation     NA  Barriers to Discharge None none    Equipment Recommendations  None recommended by PT    Recommendations for Other Services   pt requesting a CPM, but I think they should wait until she is off of lasix.  Frequency Min 3X/week (more if able due to recent R TKA)    Precautions / Restrictions Precautions Precautions: Knee Restrictions LLE Weight Bearing: Weight bearing as tolerated   Pertinent Vitals/Pain Reports 5/10 left knee pain, O2 sats decreased to 88% on RA after sitting down in recliner chair.  Good sats in mid to low 90s on RA with gait.  HR increased to 120 bpm with gait.  Down in the 90s at rest     Mobility  Bed Mobility Supine to Sit: 1: +2 Total assist;With rails;HOB elevated Supine to Sit: Patient Percentage: 50% Sitting - Scoot to Edge of Bed: 2: Max assist Details for Bed Mobility Assistance: cues for sequence and total (A) for Lt LE  Transfers Sit to Stand: 1: +2 Total assist;With upper extremity assist;From bed Sit to Stand: Patient Percentage: 60% Stand to Sit: 3: Mod assist;With upper extremity  assist;To chair/3-in-1 Details for Transfer Assistance: decr control to descend to chair. pt with poor hand placement. Pt with (A) to position Lt LE with descned to chair Ambulation/Gait Ambulation/Gait Assistance: 4: Min assist Ambulation Distance (Feet): 60 Feet Assistive device: Rolling walker Ambulation/Gait Assistance Details: verbal cues for upright posture, deep breathing while ambulating.   Gait Pattern: Step-to pattern;Shuffle;Antalgic Gait velocity: less than 1.8 ft/sec which indicates risk for recurrent falls    Exercises Total Joint Exercises Ankle Circles/Pumps: AROM;Both;10 reps;Seated Quad Sets: AROM;Left;10 reps;Seated Heel Slides: Strengthening;Left;10 reps;Seated Hip ABduction/ADduction: AAROM;Left;10 reps;Seated Straight Leg Raises: AAROM;Left;10 reps;Seated   PT Diagnosis: Difficulty walking;Abnormality of gait;Generalized weakness;Acute pain  PT Problem List: Decreased strength;Decreased activity tolerance;Decreased range of motion;Decreased balance;Decreased mobility;Decreased knowledge of use of DME;Decreased knowledge of precautions;Pain PT Treatment Interventions: DME instruction;Gait training;Functional mobility training;Therapeutic activities;Therapeutic exercise;Balance training;Neuromuscular re-education;Patient/family education;Modalities   PT Goals Acute Rehab PT Goals PT Goal Formulation: With patient/family Time For Goal Achievement: 12/12/12 Potential to Achieve Goals: Good Pt will go Supine/Side to Sit: with modified independence;with HOB 0 degrees PT Goal: Supine/Side to Sit - Progress: Goal set today Pt will go Sit to Supine/Side: with modified independence;with HOB 0 degrees PT Goal: Sit to Supine/Side - Progress: Goal set today Pt will go Sit to Stand: with supervision PT Goal: Sit to Stand - Progress: Goal set today Pt will go Stand to Sit: with supervision PT Goal: Stand to Sit - Progress: Goal  set today Pt will Ambulate: >150 feet;with  supervision;with rolling walker PT Goal: Ambulate - Progress: Goal set today Pt will Go Up / Down Stairs: Other (comment) (discharged, goal from previous admission) PT Goal: Up/Down Stairs - Progress: Discontinued (comment) (goal from previous admission) Pt will Perform Home Exercise Program: Other (comment) (discontinued from previous admission) PT Goal: Perform Home Exercise Program - Progress: Discontinued (comment) (goal from previous admission)  Visit Information  Last PT Received On: 11/28/12 Assistance Needed: +1 PT/OT Co-Evaluation/Treatment: Yes    Subjective Data   Pt reports she is tired from all of the tests today   Prior Functioning  Home Living Lives With: Spouse Available Help at Discharge: Family;Available 24 hours/day Type of Home: House Home Access: Stairs to enter [employer] of Steps: 1 Entrance Stairs-Rails: None Home Layout: One level Bathroom Shower/Tub: Forensic scientist: Standard Bathroom Accessibility: Yes How Accessible: Accessible via walker Home Adaptive Equipment: Walker - rolling Prior Function Level of Independence: Independent with assistive device(s);Needs assistance Needs Assistance: Light Housekeeping;Meal Prep Meal Prep: Maximal Light Housekeeping: Maximal Able to Take Stairs?: Yes Vocation: Retired Musician: No difficulties Dominant Hand: Right    Cognition  Cognition Overall Cognitive Status: Appears within functional limits for tasks assessed/performed Arousal/Alertness: Awake/alert Orientation Level: Appears intact for tasks assessed Behavior During Session: [facility] for tasks performed Cognition - Other Comments: very anxious    Extremity/Trunk Assessment Right Upper Extremity Assessment RUE ROM/Strength/Tone: WFL for tasks assessed RUE Sensation: WFL - Light Touch Left Upper Extremity Assessment LUE ROM/Strength/Tone: WFL for tasks assessed LUE Sensation: WFL - Light  Touch Right Lower Extremity Assessment RLE ROM/Strength/Tone: Within functional levels Left Lower Extremity Assessment LLE ROM/Strength/Tone: Deficits LLE ROM/Strength/Tone Deficits: 4/5 ankle, 3-/5 knee, 3-/5 hip.  Knee AAROM 8-75 degrees flexion.    Balance Static Sitting Balance Static Sitting - Balance Support: Bilateral upper extremity supported;Feet supported Static Sitting - Level of Assistance: 5: Stand by assistance Static Standing Balance Static Standing - Level of Assistance: 4: Min assist Static Standing - Comment/# of Minutes: with RW  End of Session PT - End of Session Equipment Utilized During Treatment: Gait belt Activity Tolerance: Patient limited by fatigue;Patient limited by pain Patient left: in chair;with call bell/phone within reach;with family/visitor present    Lurena Joiner B. Carmilla Granville, PT, DPT [phone]   11/28/2012, 1:59 PM

## 2012-11-28 NOTE — Progress Notes (Signed)
ANTICOAGULATION CONSULT NOTE - Initial Consult  Pharmacy Consult for coumadin Indication: pulmonary embolus  Allergies  Allergen Reactions  . Morphine And Related     Usually drops her blood pressure  . Amoxicillin Other (See Comments)    Unknown   . Amoxicillin-Pot Clavulanate Other (See Comments)    Unknown   . Citalopram Hydrobromide Other (See Comments)    Itching   . Clonazepam Itching  . Cymbalta (Duloxetine Hcl) Other (See Comments)    Nausea, headache and diarrhea   . Macrobid (Nitrofurantoin Macrocrystal) Other (See Comments)    Nausea, vomitting  . Penicillins Other (See Comments)    Unknown   . Pyridium (Phenazopyridine Hcl) Other (See Comments)    headache  . Zonegran Other (See Comments)    Unknown   . Zonisamide Other (See Comments)    Unknown   . Sertraline Hcl Anxiety  . Wellbutrin (Bupropion) Itching and Rash    Itching, rash, hyper     Vital Signs: Temp: 98.6 F (37 C) (03/27 2047) Temp src: Oral (03/27 2047) BP: 118/52 mmHg (03/27 2315) Pulse Rate: 126 (03/27 2315)  Labs:  Recent Labs  11/25/12 0630 11/26/12 0510 11/26/12 0913 11/27/12 0627 11/27/12 2153  HGB 11.8* 11.3*  --  10.0* 11.1*  HCT 35.8* 34.9*  --  30.3* 34.4*  PLT 212 202  --  177 226  LABPROT 14.5 20.8*  --  22.7* 21.8*  INR 1.15 1.87*  --  2.10* 1.99*  CREATININE 1.09  --  0.90  --  0.92    The CrCl is unknown because both a height and weight (above a minimum accepted value) are required for this calculation.   Medical History: Past Medical History  Diagnosis Date  . Hyperlipidemia   . Hypertension   . Hyperglycemia   . GERD (gastroesophageal reflux disease)   . Anemia   . Leukocytosis   . Diverticulitis   . Neuropathy   . Anxiety     SEVERE  . 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 DVT (deep venous thrombosis)   . Diabetes mellitus     borderline  no medications  . Arthritis   . COPD (chronic obstructive pulmonary disease)   . Atrial fibrillation with RVR 11/28/2012    Medications:  Prescriptions prior to admission  Medication Sig Dispense Refill  . alprazolam (XANAX) 2 MG tablet Take 0.5-1 tablets (1-2 mg total) by mouth 3 (three) times daily as needed. For anxiety/sleep.  90 tablet  3  . amLODipine (NORVASC) 5 MG tablet Take 1 tablet (5 mg total) by mouth daily.  90 tablet  3  . benazepril (LOTENSIN) 20 MG tablet Take 1 tablet (20 mg total) by mouth daily.  90 tablet  3  . Calcium Carbonate-Vitamin D (CALCIUM + D PO) Take 1 tablet by mouth daily.      . Cholecalciferol (VITAMIN D) 2000 UNITS tablet Take 2,000 Units by mouth daily.      Marland Kitchen CINNAMON PO Take 2,000 mg by mouth every 8 (eight) hours as needed (for blood sugar control).      Marland Kitchen esomeprazole (NEXIUM) 40 MG capsule Take 40 mg by mouth daily as needed. For acid reflux.      . fenofibrate 160 MG tablet Take 1 tablet (160 mg total) by mouth daily.  90 tablet  1  . folic acid (FOLVITE) 1 MG tablet Take 1 mg by mouth  daily.      Marland Kitchen HYDROcodone-acetaminophen (NORCO/VICODIN) 5-325 MG per tablet Take 1-2 tablets by mouth every 4 (four) hours as needed.  60 tablet  0  . Multiple Vitamin (MULTIVITAMIN) tablet Take 1 tablet by mouth daily.        . Oxymetazoline HCl (NASAL SPRAY NA) Place 1 spray into the nose daily as needed (for sinus congestion).      . simvastatin (ZOCOR) 20 MG tablet Take 20 mg by mouth every evening.      . warfarin (COUMADIN) 5 MG tablet Take 5 mg by mouth See admin instructions. Patient takes 5 mg daily, except for Wednesdays-takes 2.5mg .        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  She had extreme pain from recent knee surgery and came to ED.  She was found to be in a fib with RVR.  Her INR is 1.99 on home coumadin dose of 5 mg daily except 2.5 mg on Wed.  Goal of Therapy:  INR 2-3 Monitor platelets by anticoagulation protocol: Yes   Plan:   Coumadin 5 mg daily. Check daily PT/INR.  Jenevieve Kirschbaum Poteet 11/28/2012,12:35 AM

## 2012-11-28 NOTE — Evaluation (Signed)
Occupational Therapy Evaluation Patient Details Name: Sarah Mcguire MRN: [id number] DOB: 1941/06/16 Today's Date: 11/28/2012 Time: 4098-1191 OT Time Calculation (min): 30 min  OT Assessment / Plan / Recommendation Clinical Impression  72 yo s/p Lt TKA POD 4 admitted due to RVR 130s and decr oxgyen saturations. Pt tolerated activity RA 91-88 % this session. Ot to follow acutely. Recommend SNf for d/c planning    OT Assessment  Patient needs continued OT Services    Follow Up Recommendations  SNF    Barriers to Discharge      Equipment Recommendations  None recommended by OT    Recommendations for Other Services    Frequency  Min 2X/week    Precautions / Restrictions Precautions Precautions: Knee Restrictions LLE Weight Bearing: Weight bearing as tolerated   Pertinent Vitals/Pain Anxious RA 91-88% HR 119-131 - sustained ~120 throughout session    ADL  Toilet Transfer: +2 Total assistance Toilet Transfer: Patient Percentage: 60% Toilet Transfer Method: Sit to stand Toilet Transfer Equipment: Raised toilet seat with arms (or 3-in-1 over toilet) Equipment Used: Gait belt;Rolling walker Transfers/Ambulation Related to ADLs: pt ambulating Min (A) with decr gait velocity. pt with anxiety throughout session.  ADL Comments: Pt did not want to get out of bed initially and requesting bed pan only. Pt able to use Lt LE to boost buttock off bed surface. Pt with voiding bladder outside the edge of  bed pan. Pt agreeable to OOB after spillage. pt repositioned in chair. pt plans to d/c SNF  however very upset with the idea. pt only wants to [facility]/c to [facility] and states so. Pt educated by husband and staff that our staff are checking on bed available    OT Diagnosis: Generalized weakness;Acute pain  OT Problem List: Decreased strength;Impaired balance (sitting and/or standing);Decreased cognition;Decreased safety awareness;Decreased knowledge of use of DME or AE;Pain OT Treatment  Interventions: Self-care/ADL training;Therapeutic exercise;DME and/or AE instruction;Therapeutic activities;Patient/family education;Balance training   OT Goals Acute Rehab OT Goals OT Goal Formulation: With patient/family Time For Goal Achievement: 12/12/12 Potential to Achieve Goals: Good ADL Goals Pt Will Perform Upper Body Bathing: with set-up;Sitting, chair;Supported ADL Goal: Upper Body Bathing - Progress: Goal set today Pt Will Perform Upper Body Dressing: with set-up;Supported;Sitting, chair ADL Goal: Upper Body Dressing - Progress: Goal set today Pt Will Transfer to Toilet: with supervision;Ambulation;3-in-1 ADL Goal: Toilet Transfer - Progress: Goal set today Pt Will Perform Toileting - Clothing Manipulation: with supervision;Sitting on 3-in-1 or toilet ADL Goal: Toileting - Clothing Manipulation - Progress: Goal set today Pt Will Perform Toileting - Hygiene: with supervision;Sit to stand from 3-in-1/toilet ADL Goal: Toileting - Hygiene - Progress: Goal set today  Visit Information  Last OT Received On: 11/28/12 Assistance Needed: +1 PT/OT Co-Evaluation/Treatment: Yes Reason Eval/Treat Not Completed:  (EEG waiting to enter room)    Subjective Data  Subjective: "yall have worked my nerves"- pt very anxious and upset at end of session Patient Stated Goal: to return home; upset about pending SNF placement   Prior Functioning     Home Living Lives With: Spouse Available Help at Discharge: Family;Available 24 hours/day Type of Home: House Home Access: Stairs to enter [employer] of Steps: 1 Entrance Stairs-Rails: None Home Layout: One level Bathroom Shower/Tub: Forensic scientist: Standard Bathroom Accessibility: Yes How Accessible: Accessible via walker Home Adaptive Equipment: Walker - rolling Prior Function Level of Independence: Independent with assistive device(s);Needs assistance Needs Assistance: Light Housekeeping;Meal  Prep Meal Prep: Maximal Light Housekeeping: Maximal Able  to Take Stairs?: Yes Vocation: Retired Musician: No difficulties Dominant Hand: Right         Vision/Perception Vision - History Baseline Vision: Wears glasses all the time Patient Visual Report: No change from baseline   Cognition  Cognition Overall Cognitive Status: Appears within functional limits for tasks assessed/performed Arousal/Alertness: Awake/alert Orientation Level: Appears intact for tasks assessed Behavior During Session: [facility] for tasks performed Cognition - Other Comments: very anxious    Extremity/Trunk Assessment Right Upper Extremity Assessment RUE ROM/Strength/Tone: WFL for tasks assessed RUE Sensation: WFL - Light Touch Left Upper Extremity Assessment LUE ROM/Strength/Tone: WFL for tasks assessed LUE Sensation: WFL - Light Touch     Mobility Bed Mobility Supine to Sit: 1: +2 Total assist;With rails;HOB elevated Supine to Sit: Patient Percentage: 50% Sitting - Scoot to Edge of Bed: 2: Max assist Details for Bed Mobility Assistance: cues for sequence and total (A) for Lt LE  Transfers Sit to Stand: 1: +2 Total assist;With upper extremity assist;From bed Sit to Stand: Patient Percentage: 60% Stand to Sit: 3: Mod assist;With upper extremity assist;To chair/3-in-1 Details for Transfer Assistance: decr control to descend to chair. pt with poor hand placement. Pt with (A) to position Lt LE with descned to chair     Exercise     Balance     End of Session OT - End of Session Activity Tolerance: Patient tolerated treatment well Patient left: in chair;with call bell/phone within reach;with family/visitor present Nurse Communication: Mobility status;Precautions  GO     Lucile Shutters 11/28/2012, 1:27 PM Pager: [phone]

## 2012-11-28 NOTE — Progress Notes (Signed)
Clinical Child psychotherapist (CSW) spoke with pt spouse and provided bed offers. Pt has received and accepted a bed offer from [facility] pt first choice. CSW notified the facility and CSW to assist with dc when pt is medically ready.  Theresia Bough, MSW, Theresia Majors [phone]

## 2012-11-28 NOTE — Progress Notes (Signed)
[facility] PROGRESS NOTE  CHANEY Sarah Mcguire VHQ:[id number] DOB: 1941-06-13 DOA: 11/27/2012 PCP: Neena Rhymes, MD  Assessment/Plan: 1. Afib with RVR- Will start the patient on cardizem CD 120 mg po daily. Continue her on coumadin.Will d/c the cardizem drip. 2. Pulmonary edema- patient came with worsening shortness of breath, CXR shows pulmonary edema, will continue her on lasix 40 mg IV bid.will check the renal functions and potassium in am. 3. S/p total knee arthroplasty- as per husband , the patient did not do well on home physical therapy. Will obtain PT/OT to see if she qualifies for inpatient rehab vs short term rehab.  Code Status: Full code Family Communication: Discussed with patient and her husband at bedside Disposition Plan: To be determined   Consultants:  None  Procedures:  None  Antibiotics:  None  HPI/Subjective: Patient admitted with new onset atrial fibrillation, pulmonary edema, with elevated BNP of 957. 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 Today her HR is controlled, currently on cardizem drip.  Objective: Filed Vitals:   11/28/12 0800 11/28/12 0900 11/28/12 1000 11/28/12 1100  BP: 105/56 113/59 102/59 105/63  Pulse: 92 90 88 90  Temp:      TempSrc:      Resp: 19 20 20 22   Height:      Weight:      SpO2: 94%       Intake/Output Summary (Last 24 hours) at 11/28/12 1248 Last data filed at 11/28/12 1100  Gross per 24 hour  Intake  77.25 ml  Output    600 ml  Net -522.75 ml   Filed Weights   11/28/12 0030 11/28/12 0355  Weight: 87 kg (191 lb 12.8 oz) 85.6 kg (188 lb 11.4 oz)    Exam:   General: Appear in no acute distress  Cardiovascular: s1s2 RRR  Respiratory: Bibasilar crackles  Abdomen: Soft, nontender  Ext: No edema noted in the lower extremities   Data Reviewed: Basic Metabolic Panel:  Recent Labs Lab 11/24/12 1437 11/25/12 0630 11/26/12 0913 11/27/12 2153 11/28/12 0750  NA  --  134* 132* 135 136  K   --  5.3* 5.3* 4.9 4.0  CL  --  103 99 101 101  CO2  --  25 26 24 24   GLUCOSE  --  134* 146* 171* 144*  BUN  --  22 15 21 20   CREATININE 0.99 1.09 0.90 0.92 0.97  CALCIUM  --  9.0 9.0 10.0 9.4   Liver Function Tests:  Recent Labs Lab 11/27/12 2153  AST 22  ALT 21  ALKPHOS 50  BILITOT 0.3  PROT 6.5  ALBUMIN 2.8*   No results found for this basename: LIPASE, AMYLASE,  in the last 168 hours No results found for this basename: AMMONIA,  in the last 168 hours CBC:  Recent Labs Lab 11/25/12 0630 11/26/12 0510 11/27/12 0627 11/27/12 2153 11/28/12 0750  WBC 11.8* 13.9* 8.7 12.0* 8.8  NEUTROABS  --   --   --  9.6*  --   HGB 11.8* 11.3* 10.0* 11.1* 9.4*  HCT 35.8* 34.9* 30.3* 34.4* 28.8*  MCV 86.5 87.5 86.3 87.8 87.0  PLT 212 202 177 226 207   Cardiac Enzymes:  Recent Labs Lab 11/28/12 0124 11/28/12 0750  TROPONINI <0.30 <0.30   BNP (last 3 results)  Recent Labs  02/13/12 0915 11/27/12 2154  PROBNP 187.9* 957.5*   CBG:  Recent Labs Lab 11/27/12 0650 11/27/12 2220 11/28/12 0151 11/28/12 0622 11/28/12 0745  GLUCAP  132* 155* 165* 135* 138*    Recent Results (from the past 240 hour(s))  SURGICAL PCR SCREEN     Status: None   Collection Time    11/21/12 11:18 AM      Result Value Range Status   MRSA, PCR NEGATIVE  NEGATIVE Final   Staphylococcus aureus NEGATIVE  NEGATIVE Final   Comment:            The Xpert SA Assay (FDA     approved for NASAL specimens     in patients over 54 years of age),     is one component of     a comprehensive surveillance     program.  Test performance has     been validated by [employer] for patients greater     than or equal to 13 year old.     It is not intended     to diagnose infection nor to     guide or monitor treatment.  MRSA PCR SCREENING     Status: None   Collection Time    11/28/12  3:29 AM      Result Value Range Status   MRSA by PCR NEGATIVE  NEGATIVE Final   Comment:            The GeneXpert  MRSA Assay (FDA     approved for NASAL specimens     only), is one component of a     comprehensive MRSA colonization     surveillance program. It is not     intended to diagnose MRSA     infection nor to guide or     monitor treatment for     MRSA infections.     Studies: Dg Chest Port 1 View  11/27/2012  *RADIOLOGY REPORT*  Clinical Data: Hypoxia.  Weakness.  Wheezing.  PORTABLE CHEST - 1 VIEW  Comparison: 06/26/2012.  Findings: Low volume chest with elevation of the left hemidiaphragm.  Basilar atelectasis.  The cardiopericardial silhouette is upper limits of normal for projection.  There is pulmonary vascular congestion.  Interstitial pulmonary edema at the lung bases is compatible with mild CHF.  Aortic arch atherosclerosis.  IMPRESSION: Low volume chest with basilar interstitial pulmonary edema, most compatible with mild CHF.  No focal consolidation.  Basilar atelectasis associated with low volumes.   Original Report Authenticated By: Andreas Newport, M.D.     Scheduled Meds: . furosemide  40 mg Intravenous BID  . insulin aspart  0-15 Units Subcutaneous TID WC  . insulin aspart  0-5 Units Subcutaneous QHS  . pantoprazole  80 mg Oral Q1200  . senna  1 tablet Oral BID  . sodium chloride  3 mL Intravenous Q12H  . sodium chloride  3 mL Intravenous Q12H  . [START ON 12/03/2012] warfarin  2.5 mg Oral Custom  . warfarin  5 mg Oral Custom  . Warfarin - Pharmacist Dosing Inpatient   Does not apply q1800   Continuous Infusions: . diltiazem (CARDIZEM) infusion 5 mg/hr (11/28/12 1100)    Principal Problem:   Atrial fibrillation with RVR Active Problems:   DIABETES MELLITUS   HYPERLIPIDEMIA   OBESITY   ANEMIA   Anxiety and depression   HYPERTENSION   CARDIOMYOPATHY, DILATED   HEART FAILURE, CONGESTIVE UNSPEC   BACK PAIN, CHRONIC   HEART MURMUR, SYSTOLIC   DJD (degenerative joint disease)   Long term (current) use of anticoagulants   Acute respiratory failure with  hypoxia  Time spent: 35 min    [facility]  [facility] Pager [phone]. If 7PM-7AM, please contact night-coverage at [email or web address], password [facility] 11/28/2012, 12:48 PM  LOS: 1 day

## 2012-11-28 NOTE — Progress Notes (Signed)
Started Cardizem at 01:15; bp currently 136/54 and heart rate 121; 10 mg bolus given over 3 minutes verified with [place], Charity fundraiser; now gtt going at 5 mg/hr.  Recheck bp 108/67 and heart rate 126 currently.  Vivi Martens RN

## 2012-11-28 NOTE — Progress Notes (Signed)
Utilization review completed.  

## 2012-11-28 NOTE — Progress Notes (Signed)
Clinical Social Work Department CLINICAL SOCIAL WORK PLACEMENT NOTE 11/28/2012  Patient:  Teodoro Kil  Account Number:  [id number] Admit date:  11/26/2012  Clinical Social Worker:  Theresia Bough, Theresia Majors  Date/time:  11/28/2012 01:41 PM  Clinical Social Work is seeking post-discharge placement for this patient at the following level of care:   SKILLED NURSING   (*CSW will update this form in Epic as items are completed)   11/28/2012  Patient/family provided with [facility] Department of Clinical Social Work's list of facilities offering this level of care within the geographic area requested by the patient (or if unable, by the patient's family).  11/28/2012  Patient/family informed of their freedom to choose among providers that offer the needed level of care, that participate in Medicare, Medicaid or managed care program needed by the patient, have an available bed and are willing to accept the patient.  11/28/2012  Patient/family informed of [facility]' ownership interest in [facility], as well as of the fact that they are under no obligation to receive care at this facility.  PASARR submitted to EDS on 11/28/2012 PASARR number received from EDS on 11/28/2012  FL2 transmitted to all facilities in geographic area requested by pt/family on  11/28/2012 FL2 transmitted to all facilities within larger geographic area on   Patient informed that his/her managed care company has contracts with or will negotiate with  certain facilities, including the following:     Patient/family informed of bed offers received:  11/28/2012 Patient chooses bed at [facility] Physician recommends and patient chooses bed at    Patient to be transferred to  [facility] on   Patient to be transferred to facility by [facility]  The following physician request were entered in Epic:   Additional Comments:

## 2012-11-28 NOTE — Clinical Social Work Psychosocial (Signed)
     Clinical Social Work Department BRIEF PSYCHOSOCIAL ASSESSMENT 11/28/2012  Patient:  Sarah Mcguire, Sarah Mcguire     Account Number:  [id number]     Admit date:  11/27/2012  Clinical Social Worker:  Lourdes Sledge  Date/Time:  11/28/2012 01:30 PM  Referred by:  RN  Date Referred:  11/28/2012 Referred for  SNF Placement   Other Referral:   Interview type:  Patient Other interview type:   CSW completed assessment with pt and spouse Quanna Wittke [phone]    PSYCHOSOCIAL DATA Living Status:  HUSBAND Admitted from facility:   Level of care:   Primary support name:  Arin Vanosdol [phone] Primary support relationship to patient:  SPOUSE Degree of support available:   Pt husband supportive and actively involved in pt care.    CURRENT CONCERNS Current Concerns  Post-Acute Placement   Other Concerns:    SOCIAL WORK ASSESSMENT / PLAN CSW informed that pt recently admitted from home and interested in ST rehab.    CSW visited pt room and introduced herself and role. CSW explored pt living situation and amount of support available. Pt stated she lives with husband, who was present in room and would like to return home at discharge. CSW provided education about pt possible needs at discharge and whether pt was interested in rehab as pt MD is recommending. Pt and husband discussed pros/cons and in conclusion pt stated she would be agreeable to ST rehab.    CSW to complete FL2 and do a SNF search.   Assessment/plan status:  Psychosocial Support/Ongoing Assessment of Needs Other assessment/ plan:   Information/referral to community resources:   CSW provided pt with a SNF list and CSW contact information.    PATIENTS/FAMILYS RESPONSE TO PLAN OF CARE: Pt laying in bed alert and oriented. Pt initially preferring to dc home with [facility] however after speaking to spouse and preacher who were both present in room pt agreed to rehab. Pt prefers placement at [facility] or [facility].

## 2012-11-29 DIAGNOSIS — I82409 Acute embolism and thrombosis of unspecified deep veins of unspecified lower extremity: Secondary | ICD-10-CM

## 2012-11-29 LAB — GLUCOSE, CAPILLARY
Glucose-Capillary: 133 mg/dL — ABNORMAL HIGH (ref 70–99)
Glucose-Capillary: 117 mg/dL — ABNORMAL HIGH (ref 70–99)
Glucose-Capillary: 187 mg/dL — ABNORMAL HIGH (ref 70–99)

## 2012-11-29 LAB — BASIC METABOLIC PANEL
CO2: 26 mEq/L (ref 19–32)
Calcium: 10.1 mg/dL (ref 8.4–10.5)
Chloride: 99 mEq/L (ref 96–112)
Creatinine, Ser: 1.22 mg/dL — ABNORMAL HIGH (ref 0.50–1.10)
Creatinine, Ser: 1.1 mg/dL (ref 0.50–1.10)
GFR calc Af Amer: 50 mL/min — ABNORMAL LOW (ref >90)
GFR calc Af Amer: 57 mL/min — ABNORMAL LOW (ref >90)
GFR calc non Af Amer: 49 mL/min — ABNORMAL LOW (ref >90)
Potassium: 3.8 mEq/L (ref 3.5–5.1)
Sodium: 136 mEq/L (ref 135–145)

## 2012-11-29 LAB — PROTIME-INR
INR: 2.41 — ABNORMAL HIGH (ref 0.00–1.49)
Prothrombin Time: 25.1 seconds — ABNORMAL HIGH (ref 11.6–15.2)

## 2012-11-29 LAB — MAGNESIUM: Magnesium: 1.9 mg/dL (ref 1.5–2.5)

## 2012-11-29 MED ORDER — WARFARIN SODIUM 2.5 MG PO TABS
2.5000 mg | ORAL_TABLET | Freq: Once | ORAL | Status: AC
  Administered 2012-11-29: 2.5 mg via ORAL
  Filled 2012-11-29: qty 1

## 2012-11-29 MED ORDER — FUROSEMIDE 10 MG/ML IJ SOLN
20.0000 mg | Freq: Two times a day (BID) | INTRAMUSCULAR | Status: DC
  Administered 2012-11-29 – 2012-11-30 (×2): 20 mg via INTRAVENOUS
  Filled 2012-11-29 (×2): qty 2

## 2012-11-29 NOTE — Progress Notes (Signed)
CPM installed by ortho tech. Dressing changed.

## 2012-11-29 NOTE — Progress Notes (Signed)
[facility] PROGRESS NOTE  Sarah Mcguire WUJ:[id number] DOB: December 03, 1940 DOA: 11/27/2012 PCP: Neena Rhymes, MD  Assessment/Plan: 1. Afib with RVR- Will start the patient on cardizem CD 120 mg po daily. Continue her on coumadin. Currently on po cardizem. 2. Pulmonary edema- patient came with worsening shortness of breath, CXR shows pulmonary edema, will change the lasix to 20 mg IV q 12 hr due to hypotension. 3. S/p total knee arthroplasty- as per husband , the patient did not do well on home physical therapy. Will obtain PT/OT to see if she qualifies for inpatient rehab vs short term rehab.  Code Status: Full code Family Communication: Discussed with patient and her husband at bedside Disposition Plan: To be determined   Consultants:  None  Procedures:  None  Antibiotics:  None  HPI/Subjective: Patient admitted with new onset atrial fibrillation, pulmonary edema, with elevated BNP of 957. 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 Today feels better, still o2 sats drops to 87 on RA  Objective: Filed Vitals:   11/29/12 0435 11/29/12 0500 11/29/12 0817 11/29/12 1112  BP: 107/64  99/73 98/59  Pulse: 85     Temp: 98.6 F (37 C)  97.6 F (36.4 C) 98.3 F (36.8 C)  TempSrc: Oral  Oral Oral  Resp: 15  19   Height:      Weight:  86.4 kg (190 lb 7.6 oz)    SpO2: 95%  97%     Intake/Output Summary (Last 24 hours) at 11/29/12 1208 Last data filed at 11/28/12 1900  Gross per 24 hour  Intake      0 ml  Output    800 ml  Net   -800 ml   Filed Weights   11/28/12 0030 11/28/12 0355 11/29/12 0500  Weight: 87 kg (191 lb 12.8 oz) 85.6 kg (188 lb 11.4 oz) 86.4 kg (190 lb 7.6 oz)    Exam:   General: Appear in no acute distress  Cardiovascular: s1s2 RRR  Respiratory: Bibasilar crackles  Abdomen: Soft, nontender  Ext: No edema noted in the lower extremities   Data Reviewed: Basic Metabolic Panel:  Recent Labs Lab 11/26/12 0913 11/27/12 2153  11/28/12 0750 11/29/12 0525 11/29/12 0828  NA 132* 135 136 137 136  K 5.3* 4.9 4.0 3.8 3.8  CL 99 101 101 99 98  CO2 26 24 24 26 25   GLUCOSE 146* 171* 144* 125* 128*  BUN 15 21 20  30* 30*  CREATININE 0.90 0.92 0.97 1.22* 1.10  CALCIUM 9.0 10.0 9.4 9.8 10.1  MG  --   --   --   --  1.9   Liver Function Tests:  Recent Labs Lab 11/27/12 2153  AST 22  ALT 21  ALKPHOS 50  BILITOT 0.3  PROT 6.5  ALBUMIN 2.8*   No results found for this basename: LIPASE, AMYLASE,  in the last 168 hours No results found for this basename: AMMONIA,  in the last 168 hours CBC:  Recent Labs Lab 11/25/12 0630 11/26/12 0510 11/27/12 0627 11/27/12 2153 11/28/12 0750  WBC 11.8* 13.9* 8.7 12.0* 8.8  NEUTROABS  --   --   --  9.6*  --   HGB 11.8* 11.3* 10.0* 11.1* 9.4*  HCT 35.8* 34.9* 30.3* 34.4* 28.8*  MCV 86.5 87.5 86.3 87.8 87.0  PLT 212 202 177 226 207   Cardiac Enzymes:  Recent Labs Lab 11/28/12 0124 11/28/12 0750 11/28/12 1356  TROPONINI <0.30 <0.30 <0.30   BNP (last 3 results)  Recent Labs  02/13/12 0915 11/27/12 2154  PROBNP 187.9* 957.5*   CBG:  Recent Labs Lab 11/28/12 0745 11/28/12 1251 11/28/12 1626 11/28/12 2218 11/29/12 0816  GLUCAP 138* 138* 152* 124* 133*    Recent Results (from the past 240 hour(s))  SURGICAL PCR SCREEN     Status: None   Collection Time    11/21/12 11:18 AM      Result Value Range Status   MRSA, PCR NEGATIVE  NEGATIVE Final   Staphylococcus aureus NEGATIVE  NEGATIVE Final   Comment:            The Xpert SA Assay (FDA     approved for NASAL specimens     in patients over 73 years of age),     is one component of     a comprehensive surveillance     program.  Test performance has     been validated by [employer] for patients greater     than or equal to 83 year old.     It is not intended     to diagnose infection nor to     guide or monitor treatment.  MRSA PCR SCREENING     Status: None   Collection Time    11/28/12   3:29 AM      Result Value Range Status   MRSA by PCR NEGATIVE  NEGATIVE Final   Comment:            The GeneXpert MRSA Assay (FDA     approved for NASAL specimens     only), is one component of a     comprehensive MRSA colonization     surveillance program. It is not     intended to diagnose MRSA     infection nor to guide or     monitor treatment for     MRSA infections.     Studies: Dg Chest Port 1 View  11/27/2012  *RADIOLOGY REPORT*  Clinical Data: Hypoxia.  Weakness.  Wheezing.  PORTABLE CHEST - 1 VIEW  Comparison: 06/26/2012.  Findings: Low volume chest with elevation of the left hemidiaphragm.  Basilar atelectasis.  The cardiopericardial silhouette is upper limits of normal for projection.  There is pulmonary vascular congestion.  Interstitial pulmonary edema at the lung bases is compatible with mild CHF.  Aortic arch atherosclerosis.  IMPRESSION: Low volume chest with basilar interstitial pulmonary edema, most compatible with mild CHF.  No focal consolidation.  Basilar atelectasis associated with low volumes.   Original Report Authenticated By: Andreas Newport, M.D.     Scheduled Meds: . diltiazem  120 mg Oral Daily  . furosemide  20 mg Intravenous BID  . insulin aspart  0-15 Units Subcutaneous TID WC  . insulin aspart  0-5 Units Subcutaneous QHS  . pantoprazole  80 mg Oral Q1200  . senna  1 tablet Oral BID  . sodium chloride  3 mL Intravenous Q12H  . sodium chloride  3 mL Intravenous Q12H  . warfarin  2.5 mg Oral ONCE-1800  . Warfarin - Pharmacist Dosing Inpatient   Does not apply q1800   Continuous Infusions:    Principal Problem:   Atrial fibrillation with RVR Active Problems:   DIABETES MELLITUS   HYPERLIPIDEMIA   OBESITY   ANEMIA   Anxiety and depression   HYPERTENSION   CARDIOMYOPATHY, DILATED   HEART FAILURE, CONGESTIVE UNSPEC   BACK PAIN, CHRONIC   HEART MURMUR, SYSTOLIC   DJD (degenerative joint  disease)   Long term (current) use of anticoagulants    Acute respiratory failure with hypoxia    Time spent: 35 min    [facility]  [facility] Pager [phone]. If 7PM-7AM, please contact night-coverage at [email or web address], password [facility] 11/29/2012, 12:08 PM  LOS: 2 days

## 2012-11-29 NOTE — Progress Notes (Signed)
ANTICOAGULATION CONSULT NOTE - Initial Consult  Pharmacy Consult for coumadin Indication: pulmonary embolus  Vital Signs: Temp: 98.6 F (37 C) (03/29 0435) Temp src: Oral (03/29 0435) BP: 107/64 mmHg (03/29 0435) Pulse Rate: 85 (03/29 0435)  Labs:  Recent Labs  11/26/12 0913  11/27/12 1610 11/27/12 2153 11/28/12 0124 11/28/12 0750 11/28/12 1356 11/29/12 0525  HGB  --   < > 10.0* 11.1*  --  9.4*  --   --   HCT  --   --  30.3* 34.4*  --  28.8*  --   --   PLT  --   --  177 226  --  207  --   --   LABPROT  --   < > 22.7* 21.8*  --  22.7*  --  25.1*  INR  --   < > 2.10* 1.99*  --  2.10*  --  2.41*  CREATININE 0.90  --   --  0.92  --  0.97  --   --   TROPONINI  --   --   --   --  <0.30 <0.30 <0.30  --   < > = values in this interval not displayed.  Estimated Creatinine Clearance: 54.6 ml/min (by C-G formula based on Cr of 0.97).   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  She had extreme pain from recent knee surgery and came to ED.  She was found to be in a fib with RVR.  Her INR is 1.99 on admission on home coumadin dose of 5 mg daily except 2.5 mg on Wed.   INR now therapeutic at 2.4. Significant increase overnight will only given 2.5mg  of warfarin tonight.  Goal of Therapy:  INR 2-3 Monitor platelets by anticoagulation protocol: Yes   Plan:  Coumadin 2.5 mg daily. Check daily PT/INR.  Sheppard Coil PharmD., BCPS Clinical Pharmacist Pager [phone] 11/29/2012 8:17 AM

## 2012-11-29 NOTE — Progress Notes (Addendum)
Subjective:    S/P left TKA 5 days ago. Had A Fib. Back for medical management. Not ambulating much currently. Patient reports pain as 1 on 0-10 scale.    Objective: Vital signs in last 24 hours: Temp:  [97.6 F (36.4 C)-98.9 F (37.2 C)] 98.9 F (37.2 C) (03/29 1553) Pulse Rate:  [84-116] 89 (03/29 1551) Resp:  [15-24] 21 (03/29 1551) BP: (95-137)/(53-73) 97/63 mmHg (03/29 1551) SpO2:  [94 %-97 %] 94 % (03/29 1551) Weight:  [86.4 kg (190 lb 7.6 oz)] 86.4 kg (190 lb 7.6 oz) (03/29 0500)  Intake/Output from previous day: 03/28 0701 - 03/29 0700 In: 30 [I.V.:30] Out: 1400 [Urine:1400] Intake/Output this shift:     Recent Labs  11/27/12 0627 11/27/12 2153 11/28/12 0750  HGB 10.0* 11.1* 9.4*    Recent Labs  11/27/12 2153 11/28/12 0750  WBC 12.0* 8.8  RBC 3.92 3.31*  HCT 34.4* 28.8*  PLT 226 207    Recent Labs  11/29/12 0525 11/29/12 0828  NA 137 136  K 3.8 3.8  CL 99 98  CO2 26 25  BUN 30* 30*  CREATININE 1.22* 1.10  GLUCOSE 125* 128*  CALCIUM 9.8 10.1    Recent Labs  11/28/12 0750 11/29/12 0525  INR 2.10* 2.41*  Left knee exam:  Neurovascular intact Sensation intact distally Intact pulses distally Dorsiflexion/Plantar flexion intact Incision: dressing C/D/I Compartment soft  Assessment/Plan:    5 days s/p left TKA  Overall doing well. Plan: Discharge to [facility] medically stable .Agree with [facility]. Will order CPM . Will order PT/OT for mobilization WBAT on left. Dressing changed by nursing. Donnavin Vandenbrink G 11/29/2012, 4:52 PM

## 2012-11-29 NOTE — Progress Notes (Signed)
Orthopedic Tech Progress Note Patient Details:  Sarah Mcguire 02-20-1941 [id number]  CPM Left Knee CPM Left Knee: On Left Knee Flexion (Degrees): 60 Left Knee Extension (Degrees): 0   Nikki Dom 11/29/2012, 6:32 PM

## 2012-11-30 DIAGNOSIS — M79609 Pain in unspecified limb: Secondary | ICD-10-CM

## 2012-11-30 LAB — GLUCOSE, CAPILLARY
Glucose-Capillary: 141 mg/dL — ABNORMAL HIGH (ref 70–99)
Glucose-Capillary: 125 mg/dL — ABNORMAL HIGH (ref 70–99)

## 2012-11-30 LAB — BASIC METABOLIC PANEL
CO2: 31 mEq/L (ref 19–32)
GFR calc non Af Amer: 48 mL/min — ABNORMAL LOW (ref >90)
Glucose, Bld: 143 mg/dL — ABNORMAL HIGH (ref 70–99)
Potassium: 4.2 mEq/L (ref 3.5–5.1)
Sodium: 137 mEq/L (ref 135–145)

## 2012-11-30 MED ORDER — WARFARIN SODIUM 2.5 MG PO TABS
2.5000 mg | ORAL_TABLET | Freq: Once | ORAL | Status: AC
  Administered 2012-11-30: 2.5 mg via ORAL
  Filled 2012-11-30: qty 1

## 2012-11-30 MED ORDER — FUROSEMIDE 10 MG/ML IJ SOLN
20.0000 mg | Freq: Three times a day (TID) | INTRAMUSCULAR | Status: DC
  Administered 2012-11-30 (×2): 20 mg via INTRAVENOUS
  Filled 2012-11-30 (×4): qty 2

## 2012-11-30 NOTE — Progress Notes (Signed)
Pt transferred to 2007, per MD order report called to receiving nurse and all questions answered.

## 2012-11-30 NOTE — Progress Notes (Signed)
ANTICOAGULATION CONSULT NOTE - Initial Consult  Pharmacy Consult for coumadin Indication: pulmonary embolus  Vital Signs: Temp: 99 F (37.2 C) (03/30 0450) Temp src: Oral (03/30 0450) BP: 109/64 mmHg (03/30 0450) Pulse Rate: 94 (03/30 0450)  Labs:  Recent Labs  11/27/12 2153 11/28/12 0124 11/28/12 0750 11/28/12 1356 11/29/12 0525 11/29/12 0828 11/30/12 0515  HGB 11.1*  --  9.4*  --   --   --   --   HCT 34.4*  --  28.8*  --   --   --   --   PLT 226  --  207  --   --   --   --   LABPROT 21.8*  --  22.7*  --  25.1*  --  25.8*  INR 1.99*  --  2.10*  --  2.41*  --  2.50*  CREATININE 0.92  --  0.97  --  1.22* 1.10  --   TROPONINI  --  <0.30 <0.30 <0.30  --   --   --     Estimated Creatinine Clearance: 47.6 ml/min (by C-G formula based on Cr of 1.1).   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  She had extreme pain from recent knee surgery and came to ED.  She was found to be in a fib with RVR.  Her INR is 1.99 on admission on home coumadin dose of 5 mg daily except 2.5 mg on Wed.   INR now appears stable and at goal (2.5).   Goal of Therapy:  INR 2-3 Monitor platelets by anticoagulation protocol: Yes   Plan:  Coumadin 2.5 mg daily. Check daily PT/INR.  Sheppard Coil PharmD., BCPS Clinical Pharmacist Pager [phone] 11/30/2012 7:13 AM

## 2012-11-30 NOTE — Progress Notes (Signed)
Physical Therapy Treatment Patient Details Name: Sarah Mcguire MRN: [id number] DOB: [birth year] Today's Date: 11/30/2012 Time: 2841-3244 PT Time Calculation (min): 25 min  PT Assessment / Plan / Recommendation Comments on Treatment Session  Provided maximum encouragement for patient to ambulate with PT - declined.  Did agree to complete exercise program.  Patient with O2 sat dropping to 89% on 1 liter O2 during exercises.  Encouraged deep breathing.  Agree with need for SNF for continued therapy at discharge.    Follow Up Recommendations  SNF     Does the patient have the potential to tolerate intense rehabilitation     Barriers to Discharge        Equipment Recommendations  None recommended by PT    Recommendations for Other Services    Frequency Min 3X/week   Plan Discharge plan remains appropriate;Frequency remains appropriate    Precautions / Restrictions Precautions Precautions: Knee Restrictions Weight Bearing Restrictions: Yes LLE Weight Bearing: Weight bearing as tolerated   Pertinent Vitals/Pain     Mobility  Bed Mobility Bed Mobility: Not assessed Ambulation/Gait Ambulation/Gait Assistance: Not tested (comment) (patient declined)    Exercises Total Joint Exercises Ankle Circles/Pumps: AROM;Both;10 reps;Supine Quad Sets: AROM;Left;10 reps;Supine Short Arc Quad: AROM;Left;10 reps;Supine Heel Slides: AAROM;Left;10 reps;Supine Hip ABduction/ADduction: AROM;Left;10 reps;Supine Straight Leg Raises: AROM;Left;5 reps;Supine     PT Goals Acute Rehab PT Goals PT Goal: Perform Home Exercise Program - Progress: Progressing toward goal  Visit Information  Last PT Received On: 11/30/12 Assistance Needed: +1    Subjective Data  Subjective: "I've been up in the chair today.  I don't want to walk"  At end of session: "I can't just lay here."  Asked patient again to ambulate - "Oh no I don't want to walk"   Cognition  Cognition Overall Cognitive Status:  Impaired Area of Impairment: Memory;Problem solving Arousal/Alertness: Awake/alert Orientation Level: Appears intact for tasks assessed Behavior During Session: [facility] for tasks performed Memory: Decreased recall of precautions Memory Deficits: Unable to recall information/exercises from prior admission. Problem Solving: States she wants to go home, and can't lay in bed, and then chooses not to get up/ambulate.    Balance     End of Session PT - End of Session Equipment Utilized During Treatment: Oxygen Activity Tolerance: Patient limited by fatigue;Treatment limited secondary to medical complications (Comment) (Patient with drop in O2 sat to 89%) Patient left: in bed;with call bell/phone within reach;with family/visitor present (with foam positioner under lt. foot for knee extension) Nurse Communication: Other (comment) (Declining ambulation; decrease in O2 sat with exercise)   GP     Vena Austria 11/30/2012, 4:03 PM Durenda Hurt. Renaldo Fiddler, [facility] Acute Rehab Services Pager [phone]

## 2012-11-30 NOTE — Progress Notes (Signed)
[facility] PROGRESS NOTE  Sarah Mcguire ZOX:[id number] DOB: 1941/02/17 DOA: 11/27/2012 PCP: Neena Rhymes, MD  Assessment/Plan: 1. Afib with RVR- Will start the patient on cardizem CD 120 mg po daily. Continue her on coumadin. Currently on po cardizem. 2. Pulmonary edema- patient came with worsening shortness of breath, CXR shows pulmonary edema, will change the lasix to 20 mg IV q 8  hr  3. S/p total knee arthroplasty- as per husband , the patient did not do well on home physical therapy. Will obtain PT/OT to see if she qualifies for inpatient rehab vs short term rehab.  Code Status: Full code Family Communication: Discussed with patient and her husband at bedside Disposition Plan: To be determined   Consultants:  None  Procedures:  None  Antibiotics:  None  HPI/Subjective: Patient admitted with new onset atrial fibrillation, pulmonary edema, with elevated BNP of 957. 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 Today feels better, still o2 sats drops to 89% on RA  Objective: Filed Vitals:   11/30/12 0450 11/30/12 0500 11/30/12 0700 11/30/12 1200  BP: 109/64  104/59 111/66  Pulse: 94  84 92  Temp: 99 F (37.2 C)  97.5 F (36.4 C) 97.7 F (36.5 C)  TempSrc: Oral  Oral Oral  Resp: 19  16 19   Height:      Weight:  84.3 kg (185 lb 13.6 oz)    SpO2: 92%  95% 93%    Intake/Output Summary (Last 24 hours) at 11/30/12 1424 Last data filed at 11/30/12 0800  Gross per 24 hour  Intake    480 ml  Output    400 ml  Net     80 ml   Filed Weights   11/28/12 0355 11/29/12 0500 11/30/12 0500  Weight: 85.6 kg (188 lb 11.4 oz) 86.4 kg (190 lb 7.6 oz) 84.3 kg (185 lb 13.6 oz)    Exam:   General: Appear in no acute distress  Cardiovascular: s1s2 RRR  Respiratory: Bibasilar crackles  Abdomen: Soft, nontender  Ext: No edema noted in the lower extremities   Data Reviewed: Basic Metabolic Panel:  Recent Labs Lab 11/27/12 2153 11/28/12 0750  11/29/12 0525 11/29/12 0828 11/30/12 0822  NA 135 136 137 136 137  K 4.9 4.0 3.8 3.8 4.2  CL 101 101 99 98 96  CO2 24 24 26 25 31   GLUCOSE 171* 144* 125* 128* 143*  BUN 21 20 30* 30* 41*  CREATININE 0.92 0.97 1.22* 1.10 1.11*  CALCIUM 10.0 9.4 9.8 10.1 10.3  MG  --   --   --  1.9  --    Liver Function Tests:  Recent Labs Lab 11/27/12 2153  AST 22  ALT 21  ALKPHOS 50  BILITOT 0.3  PROT 6.5  ALBUMIN 2.8*   No results found for this basename: LIPASE, AMYLASE,  in the last 168 hours No results found for this basename: AMMONIA,  in the last 168 hours CBC:  Recent Labs Lab 11/25/12 0630 11/26/12 0510 11/27/12 0627 11/27/12 2153 11/28/12 0750  WBC 11.8* 13.9* 8.7 12.0* 8.8  NEUTROABS  --   --   --  9.6*  --   HGB 11.8* 11.3* 10.0* 11.1* 9.4*  HCT 35.8* 34.9* 30.3* 34.4* 28.8*  MCV 86.5 87.5 86.3 87.8 87.0  PLT 212 202 177 226 207   Cardiac Enzymes:  Recent Labs Lab 11/28/12 0124 11/28/12 0750 11/28/12 1356  TROPONINI <0.30 <0.30 <0.30   BNP (last 3 results)  Recent Labs  02/13/12 0915 11/27/12 2154  PROBNP 187.9* 957.5*   CBG:  Recent Labs Lab 11/29/12 1218 11/29/12 1714 11/29/12 2127 11/30/12 0737 11/30/12 1151  GLUCAP 173* 117* 187* 142* 141*    Recent Results (from the past 240 hour(s))  SURGICAL PCR SCREEN     Status: None   Collection Time    11/21/12 11:18 AM      Result Value Range Status   MRSA, PCR NEGATIVE  NEGATIVE Final   Staphylococcus aureus NEGATIVE  NEGATIVE Final   Comment:            The Xpert SA Assay (FDA     approved for NASAL specimens     in patients over 67 years of age),     is one component of     a comprehensive surveillance     program.  Test performance has     been validated by [employer] for patients greater     than or equal to 84 year old.     It is not intended     to diagnose infection nor to     guide or monitor treatment.  MRSA PCR SCREENING     Status: None   Collection Time    11/28/12   3:29 AM      Result Value Range Status   MRSA by PCR NEGATIVE  NEGATIVE Final   Comment:            The GeneXpert MRSA Assay (FDA     approved for NASAL specimens     only), is one component of a     comprehensive MRSA colonization     surveillance program. It is not     intended to diagnose MRSA     infection nor to guide or     monitor treatment for     MRSA infections.     Studies: No results found.  Scheduled Meds: . diltiazem  120 mg Oral Daily  . furosemide  20 mg Intravenous BID  . insulin aspart  0-15 Units Subcutaneous TID WC  . insulin aspart  0-5 Units Subcutaneous QHS  . pantoprazole  80 mg Oral Q1200  . senna  1 tablet Oral BID  . sodium chloride  3 mL Intravenous Q12H  . sodium chloride  3 mL Intravenous Q12H  . warfarin  2.5 mg Oral ONCE-1800  . Warfarin - Pharmacist Dosing Inpatient   Does not apply q1800   Continuous Infusions:    Principal Problem:   Atrial fibrillation with RVR Active Problems:   DIABETES MELLITUS   HYPERLIPIDEMIA   OBESITY   ANEMIA   Anxiety and depression   HYPERTENSION   CARDIOMYOPATHY, DILATED   HEART FAILURE, CONGESTIVE UNSPEC   BACK PAIN, CHRONIC   HEART MURMUR, SYSTOLIC   DJD (degenerative joint disease)   Long term (current) use of anticoagulants   Acute respiratory failure with hypoxia    Time spent: 35 min    [facility]  [facility] Pager [phone]. If 7PM-7AM, please contact night-coverage at [email or web address], password [facility] 11/30/2012, 2:24 PM  LOS: 3 days

## 2012-12-01 ENCOUNTER — Telehealth: Payer: Self-pay | Admitting: General Practice

## 2012-12-01 LAB — BASIC METABOLIC PANEL
BUN: 47 mg/dL — ABNORMAL HIGH (ref 6–23)
Calcium: 9.9 mg/dL (ref 8.4–10.5)
GFR calc Af Amer: 53 mL/min — ABNORMAL LOW (ref >90)
GFR calc non Af Amer: 46 mL/min — ABNORMAL LOW (ref >90)
Potassium: 4.7 mEq/L (ref 3.5–5.1)

## 2012-12-01 LAB — GLUCOSE, CAPILLARY

## 2012-12-01 LAB — PROTIME-INR: Prothrombin Time: 23.5 seconds — ABNORMAL HIGH (ref 11.6–15.2)

## 2012-12-01 MED ORDER — WARFARIN SODIUM 5 MG PO TABS
5.0000 mg | ORAL_TABLET | Freq: Once | ORAL | Status: DC
  Filled 2012-12-01: qty 1

## 2012-12-01 MED ORDER — BENAZEPRIL HCL 20 MG PO TABS: 10.0000 mg | ORAL_TABLET | Freq: Every day | ORAL | Status: DC

## 2012-12-01 MED ORDER — DILTIAZEM HCL ER COATED BEADS 120 MG PO CP24: 120.0000 mg | ORAL_CAPSULE | Freq: Every day | ORAL | Status: DC

## 2012-12-01 MED ORDER — FUROSEMIDE 40 MG PO TABS: 40.0000 mg | ORAL_TABLET | Freq: Every day | ORAL | Status: DC

## 2012-12-01 MED ORDER — HYDROCODONE-ACETAMINOPHEN 5-325 MG PO TABS: 1.0000 | ORAL_TABLET | ORAL | Status: DC | PRN

## 2012-12-01 NOTE — Discharge Summary (Signed)
Physician Discharge Summary  Sarah Mcguire ZOX:[id number] DOB: 1941/03/13 DOA: 11/27/2012  PCP: Neena Rhymes, MD  Admit date: 11/27/2012 Discharge date: 12/01/2012  Time spent: 50 minutes  Recommendations for Outpatient Follow-up:  1. Follow up BMP, PT/INR at the SNF  Discharge Diagnoses:  Principal Problem:   Atrial fibrillation with RVR Active Problems:   DIABETES MELLITUS   HYPERLIPIDEMIA   OBESITY   ANEMIA   Anxiety and depression   HYPERTENSION   CARDIOMYOPATHY, DILATED   HEART FAILURE, CONGESTIVE UNSPEC   BACK PAIN, CHRONIC   HEART MURMUR, SYSTOLIC   DJD (degenerative joint disease)   Long term (current) use of anticoagulants   Acute respiratory failure with hypoxia   Discharge Condition: Stable  Diet recommendation: low salt diet  Filed Weights   11/29/12 0500 11/30/12 0500 12/01/12 0300  Weight: 86.4 kg (190 lb 7.6 oz) 84.3 kg (185 lb 13.6 oz) 82.1 kg (181 lb)    History of present illness:  72 year old female with past medical history significant for diabetes, hypertension, hyperlipidemia, reflux disease, anxiety and depression, diverticulitis, dilated cardiomyopathy with ejection fraction 45% and pulmonary embolism on chronic Coumadin therapy who was discharged from the hospital earlier today after she received left knee replacement 4 days ago. Patient was in extreme pain and was unable to participate in home health physical therapy. She called the office of her orthopedics and was told to come to the ER to get evaluated. Patient denies any chest pain, palpitations, abdominal pain, and said no to all other questions asked pertaining to review of system. She does complain of left knee pain. She does have some shortness of breath.  When speaking with the husband he states that he feels she needs to get her rehabilitation and can't take care of herself at home.  ER evaluation suggests that patient has atrial fibrillation with rapid her ventricular response with  heart rate in the 130s. Patient's chest x-ray was also suggestive of mild congestive heart failure and her proBNP was 950. Hospitalist team was called to admit the patient   Hospital Course:  1. Afib with RVR- patient was  Started  on cardizem  Drip and later was changed to cardizem CD 120 mg po daily. Patient's HR is controlled, she will continue on coumadin. Called and discussed with cardiology and she will follow up cardiology as outpatient with Dr Charlton Haws. Will d/c amlodipine.  2. Pulmonary edema- patient came with worsening shortness of breath, CXR shows pulmonary edema, echo showed grade 1 diasolic dysfunction, was started on lasix  20 mg IV q 8 hr , and will now change to lasix 40 mg po daily. Will follow up at the SNF for the BMP, PT/INR. 3. S/p total knee arthroplasty- as per husband , the patient did not do well on home physical therapy. Will send her to Short term rehab at [facility]. 4. Hypertension- will stop the amlodipine, and cut down the dose of Benazepril to 10 mg po daily. As she has been started on lasix and cardizem.  Procedures: 2 D echo:The estimated ejection fraction was in the range of 40% to 45%. Diffuse hypokinesis with minor regional variation. Mild degree of septal dyssynergy noted. Doppler parameters are consistent with abnormal left ventricular relaxation (grade 1 diastolic dysfunction).     Consultations:  none  Discharge Exam: Filed Vitals:   11/30/12 1556 11/30/12 1909 11/30/12 2011 12/01/12 0300  BP:  77/54 91/66   Pulse:  98 71 64  Temp: 98.8 F (37.1 C)  98 F (36.7 C) 98.2 F (36.8 C) 97.5 F (36.4 C)  TempSrc: Oral Oral Oral Oral  Resp:  18 18 20   Height:      Weight:    82.1 kg (181 lb)  SpO2:  92% 95% 94%    General: appear in no acute distress Cardiovascular: s1s2 irregular Respiratory: Clear bilaterally  Discharge Instructions  Discharge Orders   Future Appointments Provider Department Dept Phone   01/05/2013 8:00 AM  Sheliah Hatch, MD [facility]  [place] [phone]   Future Orders Complete By Expires     Diet - low sodium heart healthy  As directed     Increase activity slowly  As directed         Medication List    STOP taking these medications       amLODipine 5 MG tablet  Commonly known as:  NORVASC      TAKE these medications       acetaminophen 325 MG tablet  Commonly known as:  TYLENOL  Take 325 mg by mouth every 6 (six) hours as needed (for headache).     alprazolam 2 MG tablet  Commonly known as:  XANAX  Take 0.5-1 tablets (1-2 mg total) by mouth 3 (three) times daily as needed. For anxiety/sleep.     benazepril 20 MG tablet  Commonly known as:  LOTENSIN  Take 0.5 tablets (10 mg total) by mouth daily.     CINNAMON PO  Take 2,000 mg by mouth 3 (three) times daily.     diltiazem 120 MG 24 hr capsule  Commonly known as:  CARDIZEM CD  Take 1 capsule (120 mg total) by mouth daily.     esomeprazole 40 MG capsule  Commonly known as:  NEXIUM  Take 40 mg by mouth daily as needed. For acid reflux.     fenofibrate 160 MG tablet  Take 1 tablet (160 mg total) by mouth daily.     folic acid 1 MG tablet  Commonly known as:  FOLVITE  Take 1 mg by mouth daily.     furosemide 40 MG tablet  Commonly known as:  LASIX  Take 1 tablet (40 mg total) by mouth daily.     HYDROcodone-acetaminophen 5-325 MG per tablet  Commonly known as:  NORCO/VICODIN  Take 1-2 tablets by mouth every 4 (four) hours as needed.     multivitamin tablet  Take 1 tablet by mouth daily.     OVER THE COUNTER MEDICATION  Take 1 tablet by mouth daily. citracal +D 630/500     OVER THE COUNTER MEDICATION  Place 1 spray into the nose every 6 (six) hours as needed (for congestion). Phenylephrine 1% nasal spray     simvastatin 20 MG tablet  Commonly known as:  ZOCOR  Take 20 mg by mouth every evening.     Vitamin D 2000 UNITS tablet  Take 2,000 Units by mouth daily.     warfarin 5  MG tablet  Commonly known as:  COUMADIN  Take 5 mg by mouth See admin instructions. Patient takes 5 mg daily, except for Wednesdays-takes 2.5mg .          The results of significant diagnostics from this hospitalization (including imaging, microbiology, ancillary and laboratory) are listed below for reference.    Significant Diagnostic Studies: Dg Chest Port 1 View  11/27/2012  *RADIOLOGY REPORT*  Clinical Data: Hypoxia.  Weakness.  Wheezing.  PORTABLE CHEST - 1 VIEW  Comparison: 06/26/2012.  Findings: Low volume  chest with elevation of the left hemidiaphragm.  Basilar atelectasis.  The cardiopericardial silhouette is upper limits of normal for projection.  There is pulmonary vascular congestion.  Interstitial pulmonary edema at the lung bases is compatible with mild CHF.  Aortic arch atherosclerosis.  IMPRESSION: Low volume chest with basilar interstitial pulmonary edema, most compatible with mild CHF.  No focal consolidation.  Basilar atelectasis associated with low volumes.   Original Report Authenticated By: Andreas Newport, M.D.     Microbiology: Recent Results (from the past 240 hour(s))  SURGICAL PCR SCREEN     Status: None   Collection Time    11/21/12 11:18 AM      Result Value Range Status   MRSA, PCR NEGATIVE  NEGATIVE Final   Staphylococcus aureus NEGATIVE  NEGATIVE Final   Comment:            The Xpert SA Assay (FDA     approved for NASAL specimens     in patients over 50 years of age),     is one component of     a comprehensive surveillance     program.  Test performance has     been validated by [employer] for patients greater     than or equal to 72 year old.     It is not intended     to diagnose infection nor to     guide or monitor treatment.  MRSA PCR SCREENING     Status: None   Collection Time    11/28/12  3:29 AM      Result Value Range Status   MRSA by PCR NEGATIVE  NEGATIVE Final   Comment:            The GeneXpert MRSA Assay (FDA     approved  for NASAL specimens     only), is one component of a     comprehensive MRSA colonization     surveillance program. It is not     intended to diagnose MRSA     infection nor to guide or     monitor treatment for     MRSA infections.     Labs: Basic Metabolic Panel:  Recent Labs Lab 11/28/12 0750 11/29/12 0525 11/29/12 0828 11/30/12 0822 12/01/12 0515  NA 136 137 136 137 140  K 4.0 3.8 3.8 4.2 4.7  CL 101 99 98 96 98  CO2 24 26 25 31 31   GLUCOSE 144* 125* 128* 143* 125*  BUN 20 30* 30* 41* 47*  CREATININE 0.97 1.22* 1.10 1.11* 1.16*  CALCIUM 9.4 9.8 10.1 10.3 9.9  MG  --   --  1.9  --   --    Liver Function Tests:  Recent Labs Lab 11/27/12 2153  AST 22  ALT 21  ALKPHOS 50  BILITOT 0.3  PROT 6.5  ALBUMIN 2.8*   No results found for this basename: LIPASE, AMYLASE,  in the last 168 hours No results found for this basename: AMMONIA,  in the last 168 hours CBC:  Recent Labs Lab 11/25/12 0630 11/26/12 0510 11/27/12 0627 11/27/12 2153 11/28/12 0750  WBC 11.8* 13.9* 8.7 12.0* 8.8  NEUTROABS  --   --   --  9.6*  --   HGB 11.8* 11.3* 10.0* 11.1* 9.4*  HCT 35.8* 34.9* 30.3* 34.4* 28.8*  MCV 86.5 87.5 86.3 87.8 87.0  PLT 212 202 177 226 207   Cardiac Enzymes:  Recent Labs Lab 11/28/12 0124 11/28/12  0750 11/28/12 1356  TROPONINI <0.30 <0.30 <0.30   BNP: BNP (last 3 results)  Recent Labs  02/13/12 0915 11/27/12 2154  PROBNP 187.9* 957.5*   CBG:  Recent Labs Lab 11/30/12 0737 11/30/12 1151 11/30/12 1703 11/30/12 2117 12/01/12 0605  GLUCAP 142* 141* 135* 125* 123*       Signed:  LAMA,GAGAN S  [facility] 12/01/2012, 11:15 AM

## 2012-12-01 NOTE — Progress Notes (Signed)
Room air saturation 92-95% at rest. Pt has acrylic nails on fingers. Pt/family given discharge instructions, medication lists, follow up appointments, and when to call the doctor.  Pt/family verbalizes understanding. Will call report to [facility].  Pt husband to transport. Thomas Hoff

## 2012-12-01 NOTE — Progress Notes (Addendum)
Physical Therapy Treatment Patient Details Name: Sarah Mcguire MRN: [id number] DOB: Jan 16, 1941 Today's Date: 12/01/2012 Time: 4098-1191 PT Time Calculation (min): 25 min  PT Assessment / Plan / Recommendation Comments on Treatment Session  Pt demonstrates some improvements in functional mobility but still is very self limiting. Pt with no desire to ambulate. Spouse insistant upon pushing pt against pt's desires. Explained to spouse that once pt becomes unsafe in her behaviors (crouching over walker in rufusal to walk) it is difficult for [place] to continue further. Educated patient on importance of ambulation and activity.  Will continue to rec SNF for increased rehabiliation upon discharge to address deficits as indicated.    Follow Up Recommendations  SNF     Does the patient have the potential to tolerate intense rehabilitation     Barriers to Discharge        Equipment Recommendations  None recommended by PT    Recommendations for Other Services    Frequency Min 3X/week   Plan Discharge plan remains appropriate;Frequency remains appropriate    Precautions / Restrictions Precautions Precautions: Knee Restrictions Weight Bearing Restrictions: Yes LLE Weight Bearing: Weight bearing as tolerated   Pertinent Vitals/Pain 4/10; SpO2 ranged between 96% to 93% throughout    Mobility  Bed Mobility Bed Mobility: Supine to Sit;Scooting to HOB Supine to Sit: 3: Mod assist Sitting - Scoot to Edge of Bed: 2: Max assist Details for Bed Mobility Assistance: VC,s for positioning and sequencing, assist to pull to upright and for hip rotation and trunk support Transfers Transfers: Sit to Stand;Stand to Sit Sit to Stand: 4: Min assist;From bed;From chair/3-in-1 Stand to Sit: 4: Min assist;To chair/3-in-1 Details for Transfer Assistance: VC,s for hand placement, and to open eyes. Pt very anxious and decreased desire to participate in any activities Ambulation/Gait Ambulation/Gait Assistance:  4: Min guard;4: Min Environmental consultant (Feet): 60 Feet Assistive device: Rolling walker Ambulation/Gait Assistance Details: VC,s for increased cadence, assist to pace pt. Pt very self limiting but demonstrates no signs of weakness or inability to ambulate.  Patient very dramatically hunched her body over rw stating I don't want to walk anymore.  Gait Pattern: Step-to pattern;Decreased stride length;Trunk flexed;Narrow base of support (emerging step through) Gait velocity: decreased General Gait Details: Pt with decreased desire to participate, ambulated minimally in hall and to and from bathroom Stairs: No    Exercises Total Joint Exercises Ankle Circles/Pumps: AROM;Both;10 reps;Seated Heel Slides: AAROM;Left;10 reps;Seated Long Arc Quad: AROM;Left;5 reps;Seated    PT Goals Acute Rehab PT Goals PT Goal: Supine/Side to Sit - Progress: Progressing toward goal PT Goal: Sit to Supine/Side - Progress: Progressing toward goal PT Goal: Sit to Stand - Progress: Progressing toward goal PT Goal: Stand to Sit - Progress: Progressing toward goal PT Goal: Ambulate - Progress: Progressing toward goal PT Goal: Perform Home Exercise Program - Progress: Progressing toward goal  Visit Information  Last PT Received On: 12/01/12 Assistance Needed: +1    Subjective Data  Subjective: "I don't want to walk anymore"   Cognition  Cognition Overall Cognitive Status: Impaired Area of Impairment: Memory;Problem solving Arousal/Alertness: Awake/alert Orientation Level: Appears intact for tasks assessed Behavior During Session: Anxious Memory: Decreased recall of precautions Memory Deficits: Unable to recall information/exercises from prior admission. Problem Solving: States she wants to go home, and can't lay in bed, and then chooses not to get up/ambulate. Cognition - Other Comments: Pt very anxious and self limiting during treatment session.    Balance  Static Sitting Balance Static  Sitting  - Balance Support: Bilateral upper extremity supported;Feet supported Static Sitting - Level of Assistance: 5: Stand by assistance Static Sitting - Comment/# of Minutes: 9 minutes during exercises  End of Session PT - End of Session Equipment Utilized During Treatment: Gait belt;Oxygen (ambulated on rm air at 93%, returned back to 2 liters after) Activity Tolerance: Patient limited by fatigue;Patient limited by pain (self limiting) Patient left: in chair;with call bell/phone within reach;with family/visitor present (with foam positioner under lt. foot for knee extension) Nurse Communication: Other (comment) (attempt ambulation with pt again today) CPM Left Knee CPM Left Knee: Off   GP     Fabio Asa 12/01/2012, 9:18 AM Charlotte Crumb, PT DPT  [phone]

## 2012-12-01 NOTE — Telephone Encounter (Signed)
Spoke with patient and patient is going to rehab for a couple of weeks.  Informed patient that INR will be followed at rehab facility and to call coumadin clinic when discharged home.  Patient verbalized understanding.

## 2012-12-01 NOTE — Care Management Note (Signed)
    Page 1 of 1   12/01/2012     2:20:10 PM   CARE MANAGEMENT NOTE 12/01/2012  Patient:  Sarah Mcguire, Sarah Mcguire   Account Number:  [id number]  Date Initiated:  12/01/2012  Documentation initiated by:  Dusty Raczkowski  Subjective/Objective Assessment:   PT S/P TKR LAST WEEK WITH POST OP AFIB.     Action/Plan:   CSW FOLLOWING FOR SNF PLACEMENT WHEN MEDICALLY STABLE.   Anticipated DC Date:  12/01/2012   Anticipated DC Plan:  SKILLED NURSING FACILITY  In-house referral  Clinical Social Worker         Choice offered to / List presented to:             Status of service:  Completed, signed off Medicare Important Message given?   (If response is "NO", the following Medicare IM given date fields will be blank) Date Medicare IM given:   Date Additional Medicare IM given:    Discharge Disposition:  SKILLED NURSING FACILITY  Per UR Regulation:  Reviewed for med. necessity/level of care/duration of stay  If discussed at Long Length of Stay Meetings, dates discussed:    Comments:  12/01/12 Sarah Feasel,RN,BSN [phone] PT DISCHARGED TO [facility] TODAY, PER CSW ARRANGEMENTS.

## 2012-12-01 NOTE — Clinical Social Work Placement (Signed)
Clinical Social Work Department CLINICAL SOCIAL WORK PLACEMENT NOTE 12/01/2012  Patient:  Sarah Mcguire, Sarah Mcguire  Account Number:  [id number] Admit date:  11/27/2012  Clinical Social Worker:  Frederico Hamman, LCSW  Date/time:  12/01/2012 12:00 M  Clinical Social Work is seeking post-discharge placement for this patient at the following level of care:   SKILLED NURSING   (*CSW will update this form in Epic as items are completed)   11/28/2012  Patient/family provided with [facility] Department of Clinical Social Work's list of facilities offering this level of care within the geographic area requested by the patient (or if unable, by the patient's family).  11/28/2012  Patient/family informed of their freedom to choose among providers that offer the needed level of care, that participate in Medicare, Medicaid or managed care program needed by the patient, have an available bed and are willing to accept the patient.  11/28/2012  Patient/family informed of [facility]' ownership interest in [facility], as well as of the fact that they are under no obligation to receive care at this facility.  PASARR submitted to EDS on 11/28/2012 PASARR number received from EDS on 11/28/2012  FL2 transmitted to all facilities in geographic area requested by pt/family on  11/28/2012 FL2 transmitted to all facilities within larger geographic area on   Patient informed that his/her managed care company has contracts with or will negotiate with  certain facilities, including the following:     Patient/family informed of bed offers received:  11/28/2012 Patient chooses bed at [facility] Physician recommends and patient chooses bed at    Patient to be transferred to [facility] on  12/01/2012 Patient to be transferred to facility by family  The following physician request were entered in Epic:   Additional Comments: Pt's dc summary was sent to SNF.  Husband at bedside to provide transport at dc.   # given to RN to call report. No further CSW needs at this time.  Frederico Hamman, LCSW [phone]

## 2012-12-01 NOTE — Progress Notes (Signed)
ANTICOAGULATION CONSULT NOTE - Follow-Up Consult  Pharmacy Consult for coumadin Indication: pulmonary embolus  Vital Signs: Temp: 97.5 F (36.4 C) (03/31 0300) Temp src: Oral (03/31 0300) Pulse Rate: 64 (03/31 0300)  Labs:  Recent Labs  11/28/12 1356  11/29/12 0525 11/29/12 0828 11/30/12 0515 11/30/12 0822 12/01/12 0515  LABPROT  --   --  25.1*  --  25.8*  --  23.5*  INR  --   --  2.41*  --  2.50*  --  2.20*  CREATININE  --   < > 1.22* 1.10  --  1.11* 1.16*  TROPONINI <0.30  --   --   --   --   --   --   < > = values in this interval not displayed.  Estimated Creatinine Clearance: 44.5 ml/min (by C-G formula based on Cr of 1.16).  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  She had extreme pain from recent knee surgery and came to ED.  She was found to be in a fib with RVR.  Her INR is 1.99 on admission on home coumadin dose of 5 mg daily except 2.5 mg on Wed.   INR now appears stable and at goal (2.2).   Goal of Therapy:  INR 2-3 Monitor platelets by anticoagulation protocol: Yes   Plan:  Coumadin 5 mg po x 1 today Check daily PT/INR.  Thank you. Okey Regal, PharmD [phone]  12/01/2012 9:32 AM

## 2012-12-02 ENCOUNTER — Non-Acute Institutional Stay (SKILLED_NURSING_FACILITY): Payer: Medicare Other | Admitting: Internal Medicine

## 2012-12-02 DIAGNOSIS — M1710 Unilateral primary osteoarthritis, unspecified knee: Secondary | ICD-10-CM

## 2012-12-02 DIAGNOSIS — E119 Type 2 diabetes mellitus without complications: Secondary | ICD-10-CM

## 2012-12-02 DIAGNOSIS — I4891 Unspecified atrial fibrillation: Secondary | ICD-10-CM

## 2012-12-02 DIAGNOSIS — K5900 Constipation, unspecified: Secondary | ICD-10-CM

## 2012-12-03 ENCOUNTER — Non-Acute Institutional Stay (SKILLED_NURSING_FACILITY): Payer: Medicare Other | Admitting: Adult Health

## 2012-12-03 DIAGNOSIS — I2699 Other pulmonary embolism without acute cor pulmonale: Secondary | ICD-10-CM

## 2012-12-03 DIAGNOSIS — E785 Hyperlipidemia, unspecified: Secondary | ICD-10-CM

## 2012-12-03 DIAGNOSIS — F419 Anxiety disorder, unspecified: Secondary | ICD-10-CM

## 2012-12-03 DIAGNOSIS — G4700 Insomnia, unspecified: Secondary | ICD-10-CM

## 2012-12-03 DIAGNOSIS — I4891 Unspecified atrial fibrillation: Secondary | ICD-10-CM

## 2012-12-03 DIAGNOSIS — F411 Generalized anxiety disorder: Secondary | ICD-10-CM

## 2012-12-03 DIAGNOSIS — I10 Essential (primary) hypertension: Secondary | ICD-10-CM

## 2012-12-03 DIAGNOSIS — I509 Heart failure, unspecified: Secondary | ICD-10-CM

## 2012-12-03 DIAGNOSIS — E119 Type 2 diabetes mellitus without complications: Secondary | ICD-10-CM

## 2012-12-03 DIAGNOSIS — K219 Gastro-esophageal reflux disease without esophagitis: Secondary | ICD-10-CM

## 2012-12-09 ENCOUNTER — Ambulatory Visit (INDEPENDENT_AMBULATORY_CARE_PROVIDER_SITE_OTHER): Payer: Medicare Other | Admitting: General Practice

## 2012-12-09 ENCOUNTER — Encounter: Payer: Self-pay | Admitting: Adult Health

## 2012-12-09 DIAGNOSIS — I82409 Acute embolism and thrombosis of unspecified deep veins of unspecified lower extremity: Secondary | ICD-10-CM

## 2012-12-09 DIAGNOSIS — Z7901 Long term (current) use of anticoagulants: Secondary | ICD-10-CM

## 2012-12-09 DIAGNOSIS — G4700 Insomnia, unspecified: Secondary | ICD-10-CM | POA: Insufficient documentation

## 2012-12-09 DIAGNOSIS — I82402 Acute embolism and thrombosis of unspecified deep veins of left lower extremity: Secondary | ICD-10-CM

## 2012-12-09 LAB — POCT INR: INR: 3.5

## 2012-12-09 NOTE — Progress Notes (Signed)
  Subjective:    Patient ID: Sarah Mcguire, female    DOB: 02/03/1941, 72 y.o.   MRN: [id number]  HPI This is a 72 year old female who complains of difficulty sleeping. She reports taking Tylenol PM at home for insomnia. She is for discharge home with Home health Nursing, PT, and OT. DME: portable oxygen for home use due to CHF/SOB. She has been admitted to [facility] on 12/01/12 from [facility] with pricipal problem of Atrial Fibrillation with RVR. She has been admitted to [facility] for a short-term rehabilitation.   Review of Systems  Constitutional: Negative.   HENT: Negative.   Eyes: Negative.   Respiratory: Negative for cough, chest tightness and wheezing.   Cardiovascular: Negative for chest pain.  Gastrointestinal: Negative.   Neurological: Negative.   Hematological: Negative for adenopathy. Does not bruise/bleed easily.  Psychiatric/Behavioral: Negative.        Objective:   Physical Exam  Constitutional: She is oriented to person, place, and time. She appears well-developed and well-nourished.  HENT:  Head: Normocephalic.  Right Ear: External ear normal.  Left Ear: External ear normal.  Eyes: Conjunctivae are normal. Pupils are equal, round, and reactive to light.  Neck: Normal range of motion. Neck supple.  Pulmonary/Chest: Effort normal and breath sounds normal. She has no wheezes. She exhibits no tenderness.  Abdominal: Soft. Bowel sounds are normal.  Musculoskeletal: Normal range of motion. She exhibits no edema and no tenderness.  Neurological: She is alert and oriented to person, place, and time.  Skin: Skin is warm and dry.  Psychiatric: She has a normal mood and affect. Her behavior is normal. Judgment and thought content normal.          Assessment & Plan:  DIABETES MELLITUS - stable  HYPERLIPIDEMIA - stable  Anxiety and depression - stable  HYPERTENSION - well-controlled  HEART FAILURE, CONGESTIVE UNSPEC - stable  GERD -  stable   Pulmonary emboli - continue Coumadin  Anxiety - stable  Atrial fibrillation with RVR - well-controlled  Insomnia - start Tylenol PM 500/25 mg PO Q HS PRN

## 2012-12-12 NOTE — Progress Notes (Signed)
Patient ID: Sarah Mcguire, female   DOB: [birth year], 72 y.o.   MRN: [id number]        HISTORY & PHYSICAL  DATE:   12/02/2012  FACILITY:  [facility]   LEVEL OF CARE: SNF   ALLERGIES:  Allergies  Allergen Reactions  . Morphine And Related     Usually drops her blood pressure  . Amoxicillin Other (See Comments)    Unknown   . Amoxicillin-Pot Clavulanate Other (See Comments)    Unknown   . Citalopram Hydrobromide Other (See Comments)    Itching   . Clonazepam Itching  . Cymbalta (Duloxetine Hcl) Other (See Comments)    Nausea, headache and diarrhea   . Macrobid (Nitrofurantoin Macrocrystal) Other (See Comments)    Nausea, vomitting  . Penicillins Other (See Comments)    Unknown   . Pyridium (Phenazopyridine Hcl) Other (See Comments)    headache  . Zonegran Other (See Comments)    Unknown   . Zonisamide Other (See Comments)    Unknown   . Sertraline Hcl Anxiety  . Wellbutrin (Bupropion) Itching and Rash    Itching, rash, hyper      CHIEF COMPLAINT:  Manage left knee osteoarthritis, atrial fibrillation and diabetes mellitus.    HISTORY OF PRESENT ILLNESS:  72 year-old, Caucasian female was hospitalized secondary to atrial fibrillation with RVR.    ATRIAL FIBRILLATION:  The patient was admitted with AFib with RVR and heart rate was in the 130s.  Chest x-ray showed mild CHF.  ProBNP was 950.    She was initially treated with Cardizem, then changed to Cardizem CD 120 mg q.d.   Amlodipine was discontinued.   the patients atrial fibrillation remains stable.  The patient denies DOE, tachycardia, orthopnea, transient neurological sx, pedal edema, palpitations, & PNDs.  No complications noted from the medications currently being used.    KNEE OSTEOARTHRITIS: Patient had a history of pain and functional disability in the knee due to end-stage osteoarthritis and has failed nonsurgical conservative treatments. Patient had worsening of pain with activity and weight bearing, pain  that interfered with activities of daily living & pain with passive range of motion. Therefore patient underwent total knee arthroplasty and tolerated the procedure well. Patient is admitted to this facility for sort short-term rehabilitation. Patient denies knee pain.  DM:pt's DM remains stable.  Pt denies polyuria, polydipsia, polyphagia, changes in vision or hypoglycemic episodes.  No complications noted from the medication presently being used.  A recent  hemoglobin A1C is not available.   PAST MEDICAL HISTORY :  Past Medical History  Diagnosis Date  . Hyperlipidemia   . Hypertension   . Hyperglycemia   . GERD (gastroesophageal reflux disease)   . Anemia   . Leukocytosis   . Diverticulitis   . Neuropathy   . Anxiety     SEVERE  . 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 DVT (deep venous thrombosis)   . Diabetes mellitus     borderline no medications  . Arthritis   . COPD (chronic obstructive pulmonary disease)   . Atrial fibrillation with RVR 11/28/2012     PAST SURGICAL HISTORY: Past Surgical History  Procedure Laterality Date  . Tonsillectomy    . Tympanostomy tube placement      R ear  . Total knee arthroplasty Left 11/24/2012    Procedure: TOTAL KNEE ARTHROPLASTY;  Surgeon: [name], MD;  Location: [facility] OR;  Service: Orthopedics;  Laterality: Left;     SOCIAL HISTORY:  reports that she has been smoking Cigarettes.  She has been smoking about 0.80 packs per day. She has never used smokeless tobacco. She reports that she does not drink alcohol or use illicit drugs.   FAMILY HISTORY:  Family History  Problem Relation Age of Onset  . Diabetes Mother   . Hypertension Mother   . Heart disease Father     MI age 33's   . Diabetes Brother   . Hypertension Brother      CURRENT MEDICATIONS: Reviewed per [facility]  REVIEW OF SYSTEMS:   GI:  Complains of constipation.    NEUROLOGICAL:   Husband states patient is lethargic after receiving pain medications.    See HPI otherwise 14 point ROS is negative.   PHYSICAL EXAMINATION  VS:  T   97.5     P  64      RR 20      BP  91/66     POX% 94 room air       WT (Lb)  GENERAL: no acute distress, morbidly obese body habitus  SKIN:  Left knee incision clean and dry.  Staples are in place.   warm & dry, no suspicious lesions or rashes, no excessive dryness EYES: conjunctivae normal, sclerae normal, normal eye lids MOUTH/THROAT: lips without lesions,no lesions in the mouth,tongue is without lesions,uvula elevates in midline NECK: supple, trachea midline, no neck masses, no thyroid tenderness, no thyromegaly LYMPHATICS: no LAN in the neck, no supraclavicular LAN RESPIRATORY: breathing is even & unlabored, BS CTAB CARDIAC: RRR, no murmur,no extra heart sounds EDEMA/VARICOSITIES:  +2 bilateral lower extremity edema  ARTERIAL:   pedal pulses nonpalpable  GI:  ABDOMEN: abdomen soft, normal BS, no masses, no tenderness  LIVER/SPLEEN: no hepatomegaly, no splenomegaly MUSCULOSKELETAL: HEAD: normal to inspection & palpation BACK: no kyphosis, scoliosis or spinal processes tenderness EXTREMITIES: LEFT UPPER EXTREMITY: full range of motion, normal strength & tone RIGHT UPPER EXTREMITY:  full range of motion, normal strength & tone LEFT LOWER EXTREMITY: Strength intact.  Range of motion not tested due to surgery.  RIGHT LOWER EXTREMITY:  Strength intact, range of motion moderate PSYCHIATRIC: the patient is alert & oriented to person, affect & behavior appropriate  LABS/RADIOLOGY: MRSA by PCR negative.    Staph aureus by PCR negative.   Glucose 125, BUN 47, creatinine 1.16, otherwise BMP normal.   Magnesium 1.9, albumin 2.8, otherwise liver profile normal.    Hemoglobin 9.4, MCV 87, otherwise CBC normal.    Troponin-I less 0.3 x3.     ASSESSMENT/PLAN:  Atrial fibrillation.  Now rate controlled.   Left knee  osteoarthritis.  Status post left total knee arthroplasty.  Continue rehabilitation.   Diabetes mellitus.  Continue current medications.   Constipation.  New problem.  Start MiraLAX 17 g q.d.   Lethargy.  Secondary to pain medications.  Decrease Norco to 1 tablet q.4 p.r.n.   Hypertension.  Blood pressures were low in the hospital.  Amlodipine was discontinued and benazepril was increased.    GERD.  Well controlled.   Renal insufficiency.  Reassess.   Acute blood loss anemia.  Reassess.    Check CBC and BMP.    I have reviewed patient's medical records received at admission/from hospitalization.  CPT CODE: 40102.

## 2012-12-15 DIAGNOSIS — R269 Unspecified abnormalities of gait and mobility: Secondary | ICD-10-CM

## 2012-12-15 DIAGNOSIS — Z471 Aftercare following joint replacement surgery: Secondary | ICD-10-CM

## 2012-12-15 DIAGNOSIS — I4891 Unspecified atrial fibrillation: Secondary | ICD-10-CM

## 2012-12-16 ENCOUNTER — Ambulatory Visit (INDEPENDENT_AMBULATORY_CARE_PROVIDER_SITE_OTHER): Payer: Medicare Other | Admitting: General Practice

## 2012-12-16 DIAGNOSIS — Z7901 Long term (current) use of anticoagulants: Secondary | ICD-10-CM

## 2012-12-16 LAB — POCT INR: INR: 6.4

## 2012-12-18 ENCOUNTER — Ambulatory Visit (INDEPENDENT_AMBULATORY_CARE_PROVIDER_SITE_OTHER): Payer: Medicare Other | Admitting: General Practice

## 2012-12-18 DIAGNOSIS — Z7901 Long term (current) use of anticoagulants: Secondary | ICD-10-CM

## 2012-12-18 LAB — POCT INR: INR: 4.5

## 2012-12-23 ENCOUNTER — Ambulatory Visit (INDEPENDENT_AMBULATORY_CARE_PROVIDER_SITE_OTHER): Payer: Medicare Other | Admitting: General Practice

## 2012-12-23 DIAGNOSIS — I2699 Other pulmonary embolism without acute cor pulmonale: Secondary | ICD-10-CM

## 2012-12-23 DIAGNOSIS — Z7901 Long term (current) use of anticoagulants: Secondary | ICD-10-CM

## 2012-12-23 DIAGNOSIS — I82409 Acute embolism and thrombosis of unspecified deep veins of unspecified lower extremity: Secondary | ICD-10-CM

## 2012-12-23 LAB — POCT INR: INR: 2.8

## 2012-12-24 DIAGNOSIS — M6281 Muscle weakness (generalized): Secondary | ICD-10-CM

## 2012-12-25 ENCOUNTER — Ambulatory Visit (INDEPENDENT_AMBULATORY_CARE_PROVIDER_SITE_OTHER): Payer: Medicare Other | Admitting: Cardiovascular Disease

## 2012-12-25 ENCOUNTER — Encounter: Payer: Medicare Other | Admitting: Cardiovascular Disease

## 2012-12-25 VITALS — BP 100/70 | HR 80 | Wt 193.0 lb

## 2012-12-25 DIAGNOSIS — I4891 Unspecified atrial fibrillation: Secondary | ICD-10-CM

## 2012-12-25 DIAGNOSIS — I428 Other cardiomyopathies: Secondary | ICD-10-CM

## 2012-12-25 MED ORDER — LISINOPRIL 5 MG PO TABS: 5.0000 mg | ORAL_TABLET | Freq: Every day | ORAL | Status: DC

## 2012-12-25 MED ORDER — FUROSEMIDE 40 MG PO TABS: 40.0000 mg | ORAL_TABLET | Freq: Every day | ORAL | Status: DC

## 2012-12-25 MED ORDER — DILTIAZEM HCL ER COATED BEADS 120 MG PO CP24: 120.0000 mg | ORAL_CAPSULE | Freq: Every day | ORAL | Status: DC

## 2012-12-25 NOTE — Assessment & Plan Note (Signed)
Well controlled.  Continue current medications and low sodium Dash type diet.    

## 2012-12-25 NOTE — Assessment & Plan Note (Signed)
Resolved Continue cardizem Need anticoagulation for DVT/PE

## 2012-12-25 NOTE — Patient Instructions (Addendum)
Your physician recommends that you schedule a follow-up appointment in: AS NEEDED  Your physician recommends that you continue on your current medications as directed. Please refer to the Current Medication list given to you today.  

## 2012-12-25 NOTE — Assessment & Plan Note (Signed)
EF stable by recent echo and only mildly depressed Continue medical Rx

## 2012-12-25 NOTE — Progress Notes (Signed)
Patient ID: Sarah Mcguire, female   DOB: June 09, 1941, 72 y.o.   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 Patient was in extreme pain and was unable to participate in home health physical therapy. She called the office of her orthopedics and was told to come to the ER to get evaluated. Patient denies any chest pain, palpitations, abdominal pain, and said no to all other questions asked pertaining to review of system. She does complain of left knee pain.In ER she was found to be in  atrial fibrillation with rapid her ventricular response with heart rate in the 130s. Patient's chest x-ray was also suggestive of mild congestive heart failure and her proBNP was 950.  Discharged on cardizem and lasix  Already on anticoagulaiton  She did have a w/u in 2011 after sepsis from diverticulitis and EF was 45% then with normal myovue no history of CAD  Echo 11/28/12 Study Conclusions  - Left ventricle: The cavity size was normal. There was mild focal basal hypertrophy of the septum. Systolic function was mildly to moderately reduced. The estimated ejection fraction was in the range of 40% to 45%. Diffuse hypokinesis with minor regional variation. Mild degree of septal dyssynergy noted. Doppler parameters are consistent with abnormal left ventricular relaxation (grade 1 diastolic dysfunction). - Aortic valve: Mildly calcified annulus. Probably trileaflet. - Aortic root: The aortic root was upper normal in size. - Mitral valve: Calcified annulus. Trivial regurgitation. - Left atrium: The atrium was at the upper limits of normal in size. - Right ventricle: Systolic function was mildly  reduced. - Right atrium: Central venous pressure: 5mm Hg (est). - Tricuspid valve: Trivial regurgitation. - Pulmonary arteries: Systolic pressure could not be accurately estimated. - Pericardium, extracardiac: There was no pericardial Effusion.  Since rehab improved. 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 No chest pain Does not notice palpitations. Needs to be on coumadin until at least 10/14 due to DVT/PE.       ROS: Denies fever, malais, weight loss, blurry vision, decreased visual acuity, cough, sputum, SOB, hemoptysis, pleuritic pain, palpitaitons, heartburn, abdominal pain, melena, lower extremity edema, claudication, or rash.  All other systems reviewed and negative   General: Affect appropriate Overweight white female HEENT: normal Neck supple with no adenopathy JVP normal no bruits no thyromegaly Lungs clear with no wheezing and good diaphragmatic motion Heart:  S1/S2 no murmur,rub, gallop or click PMI normal Abdomen: benighn, BS positve, no tenderness, no AAA no bruit.  No HSM or HJR Distal pulses intact with no bruits Plus 2 edema LLE  With LTKR  Neuro non-focal Skin warm and dry No muscular weakness  Medications Current Outpatient Prescriptions  Medication Sig Dispense Refill  . acetaminophen (TYLENOL) 325 MG tablet Take 325 mg by mouth every 6 (six) hours as needed (for headache).      Marland Kitchen alprazolam (XANAX) 2 MG tablet Take 0.5-1 tablets (1-2 mg total) by mouth 3 (three) times daily as needed. For anxiety/sleep.  90 tablet  3  . Cholecalciferol (VITAMIN D) 2000 UNITS tablet Take 2,000 Units by mouth daily.      Marland Kitchen CINNAMON PO Take 2,000 mg by mouth 3 (three) times daily.      Marland Kitchen diltiazem (CARDIZEM CD)  120 MG 24 hr capsule Take 1 capsule (120 mg total) by mouth daily.  30 capsule  0  . esomeprazole (NEXIUM) 40 MG capsule Take 40 mg by mouth daily as needed. For acid reflux.      . fenofibrate 160 MG tablet Take 1 tablet (160 mg total) by  mouth daily.  90 tablet  1  . folic acid (FOLVITE) 1 MG tablet Take 1 mg by mouth daily.      . furosemide (LASIX) 40 MG tablet Take 1 tablet (40 mg total) by mouth daily.  30 tablet  0  . HYDROcodone-acetaminophen (NORCO/VICODIN) 5-325 MG per tablet Take 1-2 tablets by mouth every 4 (four) hours as needed.  30 tablet  0  . lisinopril (PRINIVIL,ZESTRIL) 5 MG tablet Take 5 mg by mouth daily.      . Multiple Vitamin (MULTIVITAMIN) tablet Take 1 tablet by mouth daily.        Marland Kitchen OVER THE COUNTER MEDICATION Take 1 tablet by mouth daily. citracal +D 630/500      . OVER THE COUNTER MEDICATION Place 1 spray into the nose every 6 (six) hours as needed (for congestion). Phenylephrine 1% nasal spray      . simvastatin (ZOCOR) 20 MG tablet Take 20 mg by mouth every evening.      . warfarin (COUMADIN) 5 MG tablet Take 5 mg by mouth See admin instructions. Patient takes 5 mg daily, except for Wednesdays-takes 2.5mg .       No current facility-administered medications for this visit.    Allergies Morphine and related; Amoxicillin; Amoxicillin-pot clavulanate; Citalopram hydrobromide; Clonazepam; Cymbalta; Macrobid; Penicillins; Pyridium; Zonegran; Zonisamide; Sertraline hcl; and Wellbutrin  Family History: Family History  Problem Relation Age of Onset  . Diabetes Mother   . Hypertension Mother   . Heart disease Father     MI age 85's   . Diabetes Brother   . Hypertension Brother     Social History: History   Social History  . Marital Status: Married    Spouse Name: N/A    Number of Children: N/A  . Years of Education: N/A   Occupational History  . Not on file.   Social History Main Topics  . Smoking status: Current Every Day Smoker -- 0.80 packs/day    Types: Cigarettes  . Smokeless tobacco: Never Used  . Alcohol Use: No  . Drug Use: No  . Sexually Active: Yes   Other Topics Concern  . Not on file   Social History Narrative  . No narrative on file    Electrocardiogram:  4/7  SR  rate 98 nonspecficic ST/T wave changes   Today SR rate 88 QT 398 otherwise normal  Assessment and Plan

## 2012-12-25 NOTE — Assessment & Plan Note (Signed)
Needs to be on anticoagulaiton until 10/14  If coumadin hard to control can change to xarelto Will send Dr Beverely Low a note Needs f/u duplex LLE in 3 months. 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  Consider changing to demedex for diuretic as she thinks Lasix making her BS worse

## 2012-12-25 NOTE — Addendum Note (Signed)
Addended by: Scherrie Bateman E on: 12/25/2012 04:22 PM   Modules accepted: Orders

## 2012-12-30 ENCOUNTER — Ambulatory Visit (INDEPENDENT_AMBULATORY_CARE_PROVIDER_SITE_OTHER): Payer: Medicare Other | Admitting: General Practice

## 2012-12-30 DIAGNOSIS — I2699 Other pulmonary embolism without acute cor pulmonale: Secondary | ICD-10-CM

## 2012-12-30 DIAGNOSIS — I82409 Acute embolism and thrombosis of unspecified deep veins of unspecified lower extremity: Secondary | ICD-10-CM

## 2012-12-30 DIAGNOSIS — Z7901 Long term (current) use of anticoagulants: Secondary | ICD-10-CM

## 2012-12-30 LAB — POCT INR: INR: 3.5

## 2013-01-01 ENCOUNTER — Ambulatory Visit (INDEPENDENT_AMBULATORY_CARE_PROVIDER_SITE_OTHER): Payer: Medicare Other | Admitting: Family Medicine

## 2013-01-01 ENCOUNTER — Encounter: Payer: Self-pay | Admitting: Family Medicine

## 2013-01-01 VITALS — BP 100/70 | HR 93 | Temp 98.2°F | Ht 62.5 in | Wt 189.5 lb

## 2013-01-01 DIAGNOSIS — M1712 Unilateral primary osteoarthritis, left knee: Secondary | ICD-10-CM

## 2013-01-01 DIAGNOSIS — R350 Frequency of micturition: Secondary | ICD-10-CM

## 2013-01-01 DIAGNOSIS — I4891 Unspecified atrial fibrillation: Secondary | ICD-10-CM

## 2013-01-01 DIAGNOSIS — R300 Dysuria: Secondary | ICD-10-CM

## 2013-01-01 DIAGNOSIS — E119 Type 2 diabetes mellitus without complications: Secondary | ICD-10-CM

## 2013-01-01 DIAGNOSIS — M1710 Unilateral primary osteoarthritis, unspecified knee: Secondary | ICD-10-CM

## 2013-01-01 LAB — POCT URINALYSIS DIPSTICK
Blood, UA: NEGATIVE
Glucose, UA: NEGATIVE
Nitrite, UA: NEGATIVE
Protein, UA: NEGATIVE
Spec Grav, UA: <=1.005
Urobilinogen, UA: 0.2
pH, UA: 7.5

## 2013-01-01 MED ORDER — CIPROFLOXACIN HCL 250 MG PO TABS: 250.0000 mg | ORAL_TABLET | Freq: Two times a day (BID) | ORAL | Status: DC

## 2013-01-01 NOTE — Patient Instructions (Addendum)
Follow up in 3 months to recheck diabetes Start the Cipro as directed for possible UTI- we're sending urine for culture Follow up as scheduled for your coumadin- antibiotics can change your levels Continue the Lasix and the Diltiazem as directed Take your time- recovery is a process and you can't rush it! Keep up the good work on physical therapy You look great! Hang in there!

## 2013-01-01 NOTE — Progress Notes (Signed)
  Subjective:    Patient ID: Sarah Mcguire, female    DOB: 04-27-1941, 72 y.o.   MRN: [id number]  HPI S/p knee replacement- L knee, in PT.  Healing well.  Very stiff and painful in AM.  Still having home health PT.  DM- chronic problem, diet controlled.  Not checking sugars regularly.  Husband reports CBGs have been elevated compared to previous.  Due for A1C.  Denies symptomatic lows- no dizziness/shaky feeling.  No CP, SOB, HAs, visual changes, N/V/D.  A fib- new dx.  Pt saw Dr Eden Emms on 4/24 and now on Dilt, lisinopril.  On lasix.  Pt feels this med is working but concerned about possible effects on sugars.  Dysuria- sxs started 1 week ago.  Denies frequency or urgency above baseline.  Hx of UTI  Review of Systems For ROS see HPI     Objective:   Physical Exam  Vitals reviewed. Constitutional: She is oriented to person, place, and time. She appears well-developed and well-nourished. No distress.  HENT:  Head: Normocephalic and atraumatic.  Eyes: Conjunctivae and EOM are normal. Pupils are equal, round, and reactive to light.  Neck: Normal range of motion. Neck supple. No thyromegaly present.  Cardiovascular: Normal rate, regular rhythm, normal heart sounds and intact distal pulses.   No murmur heard. Pulmonary/Chest: Effort normal and breath sounds normal. No respiratory distress.  Abdominal: Soft. She exhibits no distension. There is no tenderness.  Musculoskeletal: She exhibits edema (1+ edema of L ankle).  Well healing L anterior knee incision  Lymphadenopathy:    She has no cervical adenopathy.  Neurological: She is alert and oriented to person, place, and time.  Skin: Skin is warm and dry.  Psychiatric: She has a normal mood and affect. Her behavior is normal.          Assessment & Plan:

## 2013-01-04 NOTE — Assessment & Plan Note (Signed)
Pt again w/ sxs.  Get urine cx prior to starting abx.  UA not overwhelmingly suggestive of infxn.  Will follow.

## 2013-01-04 NOTE — Assessment & Plan Note (Signed)
Chronic problem.  Had A1C done while in hospital.  No need to repeat today.  Told pt to monitor CBGs and again discussed that Lasix should not have role in CBG control.  Will follow closely.

## 2013-01-04 NOTE — Assessment & Plan Note (Signed)
S/p L TKR April 2014.  Incision is healing well and pt is participating in home health PT.  Will continue to follow along and assist as able.

## 2013-01-04 NOTE — Assessment & Plan Note (Signed)
New to provider.  Now following w/ Dr Eden Emms.  On Coumadin for DVT and now A fib.  On Dilt for rate control.  Pt asymptomatic today and HR regular.

## 2013-01-05 ENCOUNTER — Ambulatory Visit: Payer: Medicare Other | Admitting: Family Medicine

## 2013-01-06 ENCOUNTER — Ambulatory Visit (INDEPENDENT_AMBULATORY_CARE_PROVIDER_SITE_OTHER): Payer: Medicare Other | Admitting: General Practice

## 2013-01-06 DIAGNOSIS — I2699 Other pulmonary embolism without acute cor pulmonale: Secondary | ICD-10-CM

## 2013-01-06 DIAGNOSIS — Z7901 Long term (current) use of anticoagulants: Secondary | ICD-10-CM

## 2013-01-06 DIAGNOSIS — I82402 Acute embolism and thrombosis of unspecified deep veins of left lower extremity: Secondary | ICD-10-CM

## 2013-01-06 DIAGNOSIS — I82409 Acute embolism and thrombosis of unspecified deep veins of unspecified lower extremity: Secondary | ICD-10-CM

## 2013-01-12 ENCOUNTER — Other Ambulatory Visit: Payer: Self-pay | Admitting: Family Medicine

## 2013-01-13 NOTE — Telephone Encounter (Signed)
Med filled.  

## 2013-01-15 ENCOUNTER — Telehealth: Payer: Self-pay | Admitting: General Practice

## 2013-01-15 NOTE — Telephone Encounter (Signed)
Pt called to report bleeding from hemmorhoid.  Pt states that she has had irritation and has been scratching.  Says that blood loss is minimal and described blood stain the size of a dime on the toilet tissue.  I told patient I would be at the [facility] office tomorrow if she wanted me to check her INR.  Informed patient to go to the emergency room if unusual amount of bleeding occurs.

## 2013-01-30 ENCOUNTER — Ambulatory Visit (INDEPENDENT_AMBULATORY_CARE_PROVIDER_SITE_OTHER): Payer: Medicare Other | Admitting: Family Medicine

## 2013-01-30 DIAGNOSIS — Z7901 Long term (current) use of anticoagulants: Secondary | ICD-10-CM

## 2013-01-30 DIAGNOSIS — I2699 Other pulmonary embolism without acute cor pulmonale: Secondary | ICD-10-CM

## 2013-01-30 DIAGNOSIS — I82409 Acute embolism and thrombosis of unspecified deep veins of unspecified lower extremity: Secondary | ICD-10-CM

## 2013-01-30 LAB — POCT INR: INR: 2.3

## 2013-02-09 ENCOUNTER — Ambulatory Visit (INDEPENDENT_AMBULATORY_CARE_PROVIDER_SITE_OTHER): Payer: Medicare Other | Admitting: Family Medicine

## 2013-02-09 ENCOUNTER — Encounter: Payer: Self-pay | Admitting: Family Medicine

## 2013-02-09 VITALS — BP 110/80 | HR 99 | Temp 98.2°F | Ht 62.5 in | Wt 186.2 lb

## 2013-02-09 DIAGNOSIS — E119 Type 2 diabetes mellitus without complications: Secondary | ICD-10-CM

## 2013-02-09 DIAGNOSIS — I4891 Unspecified atrial fibrillation: Secondary | ICD-10-CM

## 2013-02-09 NOTE — Assessment & Plan Note (Signed)
Chronic problem.  Reviewed pt's sugars- adequately controlled.  Asymptomatic.  Reviewed appropriate evening snacks- high protein, low carb- and that ice cream and toast don't meet the criteria.  Again reviewed carbs and importance of low carb diet.  Will continue to follow.

## 2013-02-09 NOTE — Assessment & Plan Note (Signed)
NSR today.  Tried to reassurance pt.  She remains anxious about this.  Will continue to follow.

## 2013-02-09 NOTE — Progress Notes (Signed)
  Subjective:    Patient ID: [name], female    DOB: 1941-05-01, 72 y.o.   MRN: [id number]  HPI DM- pt concerned b/c she feels CBGs are too high.  Checking prior to bed ~9pm, running 110-130s.  Pt not sure what sugars should be- despite multiple discussions.  Pt is having evening snack of peanut butter toast and ice cream.  afib- pt very anxious that her heart is 'out of rhythm'.  Saw Cardiology last month.  Denies CP, SOB, HAs, visual changes, no edema above baseline on L.   Review of Systems For ROS see HPI     Objective:   Physical Exam  Vitals reviewed. Constitutional: She is oriented to person, place, and time. She appears well-developed and well-nourished. No distress.  HENT:  Head: Normocephalic and atraumatic.  Eyes: Conjunctivae and EOM are normal. Pupils are equal, round, and reactive to light.  Neck: Normal range of motion. Neck supple. No thyromegaly present.  Cardiovascular: Normal rate, regular rhythm, normal heart sounds and intact distal pulses.   No murmur heard. Pulmonary/Chest: Effort normal and breath sounds normal. No respiratory distress.  Abdominal: Soft. She exhibits no distension. There is no tenderness.  Musculoskeletal: She exhibits no edema.  Lymphadenopathy:    She has no cervical adenopathy.  Neurological: She is alert and oriented to person, place, and time.  Skin: Skin is warm and dry.  Psychiatric: She has a normal mood and affect. Her behavior is normal.          Assessment & Plan:

## 2013-02-09 NOTE — Patient Instructions (Addendum)
Follow up as scheduled for diabetes check You look great! Your sugars look fine- don't stress about this If your sugar is <130, have a snack.  Make sure you eat protein! Call with any questions or concerns Hang in there!!!

## 2013-02-23 ENCOUNTER — Encounter: Payer: Self-pay | Admitting: Family Medicine

## 2013-02-23 ENCOUNTER — Ambulatory Visit (INDEPENDENT_AMBULATORY_CARE_PROVIDER_SITE_OTHER): Payer: Medicare Other | Admitting: Family Medicine

## 2013-02-23 VITALS — BP 122/82 | HR 104 | Temp 98.1°F | Wt 187.8 lb

## 2013-02-23 DIAGNOSIS — R350 Frequency of micturition: Secondary | ICD-10-CM

## 2013-02-23 DIAGNOSIS — N390 Urinary tract infection, site not specified: Secondary | ICD-10-CM

## 2013-02-23 LAB — POCT URINALYSIS DIPSTICK
Bilirubin, UA: NEGATIVE
Glucose, UA: NEGATIVE
Nitrite, UA: NEGATIVE

## 2013-02-23 MED ORDER — CIPROFLOXACIN HCL 250 MG PO TABS: 250.0000 mg | ORAL_TABLET | Freq: Two times a day (BID) | ORAL | Status: DC

## 2013-02-23 MED ORDER — ALPRAZOLAM 2 MG PO TABS: 1.0000 mg | ORAL_TABLET | Freq: Three times a day (TID) | ORAL | Status: DC | PRN

## 2013-02-23 MED ORDER — HYDROCODONE-ACETAMINOPHEN 5-325 MG PO TABS: 1.0000 | ORAL_TABLET | ORAL | Status: DC | PRN

## 2013-02-23 NOTE — Patient Instructions (Addendum)
This appears to be a UTI Start the Cipro twice daily Drink plenty of fluids Call with any questions or concerns Hang in there!

## 2013-02-23 NOTE — Addendum Note (Signed)
Addended by: Verdie Shire on: 02/23/2013 04:49 PM   Modules accepted: Orders

## 2013-02-23 NOTE — Assessment & Plan Note (Signed)
Recurrent problem for pt.  Start cipro as pt is very limited by her allergy list.  Reviewed supportive care and red flags that should prompt return.  Pt expressed understanding and is in agreement w/ plan.

## 2013-02-23 NOTE — Progress Notes (Signed)
  Subjective:    Patient ID: Sarah Mcguire, female    DOB: Jan 29, 1941, 72 y.o.   MRN: [id number]  HPI Suprapubic pain- described as 'irritating', 'i just know it's there'.  No frequency above baseline since starting lasix.  No dysuria.  No hesitancy.  No fevers.  1st noticed 'a couple of days ago'.  No bowel changes.  No nausea or vomiting.  No current vaginal issues.   Review of Systems For ROS see HPI     Objective:   Physical Exam  Vitals reviewed. Constitutional: She is oriented to person, place, and time. She appears well-developed and well-nourished. No distress.  Abdominal: Soft. Bowel sounds are normal. She exhibits no distension. There is tenderness (mild suprapubic tenderness w/out TTP in RLQ or LLQ). There is no rebound and no guarding.  Neurological: She is alert and oriented to person, place, and time.          Assessment & Plan:

## 2013-02-25 ENCOUNTER — Ambulatory Visit (INDEPENDENT_AMBULATORY_CARE_PROVIDER_SITE_OTHER): Payer: Medicare Other | Admitting: General Practice

## 2013-02-25 DIAGNOSIS — Z7901 Long term (current) use of anticoagulants: Secondary | ICD-10-CM

## 2013-02-25 DIAGNOSIS — I82409 Acute embolism and thrombosis of unspecified deep veins of unspecified lower extremity: Secondary | ICD-10-CM

## 2013-02-25 DIAGNOSIS — I2699 Other pulmonary embolism without acute cor pulmonale: Secondary | ICD-10-CM

## 2013-02-26 LAB — URINE CULTURE: Colony Count: 30000

## 2013-03-16 ENCOUNTER — Encounter: Payer: Self-pay | Admitting: Family Medicine

## 2013-03-16 ENCOUNTER — Ambulatory Visit (INDEPENDENT_AMBULATORY_CARE_PROVIDER_SITE_OTHER): Payer: Medicare Other | Admitting: Family Medicine

## 2013-03-16 VITALS — BP 108/80 | HR 97 | Temp 98.1°F | Ht 62.5 in | Wt 184.2 lb

## 2013-03-16 DIAGNOSIS — N760 Acute vaginitis: Secondary | ICD-10-CM

## 2013-03-16 LAB — POCT URINALYSIS DIPSTICK
Blood, UA: NEGATIVE
Glucose, UA: NEGATIVE
Spec Grav, UA: <=1.005
Urobilinogen, UA: 0.2

## 2013-03-16 MED ORDER — TERCONAZOLE 80 MG VA SUPP: 80.0000 mg | Freq: Every day | VAGINAL | Status: DC

## 2013-03-16 NOTE — Assessment & Plan Note (Signed)
Pt's sxs and normal UA consistent w/ yeast.  Pt currently on coumadin and last time she took Diflucan it caused INR to jump to >6.  Will start vaginal terconazole.  Reviewed supportive care and red flags that should prompt return.  Pt expressed understanding and is in agreement w/ plan.

## 2013-03-16 NOTE — Patient Instructions (Addendum)
Insert 1 vaginal suppository nightly x3 days A daily yogurt would be helpful Call with any questions or concerns Hang in there!

## 2013-03-16 NOTE — Progress Notes (Signed)
  Subjective:    Patient ID: Sarah Mcguire, female    DOB: 05/06/1941, 72 y.o.   MRN: [id number]  HPI Vaginal itching- pt reports sxs started Thursday or Friday.  Having both internal and external vaginal itching.  No dysuria.  No frequency or urgency.  'i douched all weekend'.   Review of Systems For ROS see HPI     Objective:   Physical Exam  Vitals reviewed. Constitutional: She appears well-developed and well-nourished. No distress.  Genitourinary:  Deferred at pt's request  Skin: Skin is warm and dry.          Assessment & Plan:

## 2013-03-24 ENCOUNTER — Ambulatory Visit: Payer: Medicare Other | Admitting: Gynecology

## 2013-03-25 ENCOUNTER — Ambulatory Visit (INDEPENDENT_AMBULATORY_CARE_PROVIDER_SITE_OTHER): Payer: Medicare Other | Admitting: General Practice

## 2013-03-25 DIAGNOSIS — I2699 Other pulmonary embolism without acute cor pulmonale: Secondary | ICD-10-CM

## 2013-03-25 DIAGNOSIS — Z7901 Long term (current) use of anticoagulants: Secondary | ICD-10-CM

## 2013-03-25 DIAGNOSIS — I82409 Acute embolism and thrombosis of unspecified deep veins of unspecified lower extremity: Secondary | ICD-10-CM

## 2013-03-25 LAB — POCT INR: INR: 2.9

## 2013-03-26 ENCOUNTER — Encounter: Payer: Self-pay | Admitting: Gynecology

## 2013-03-26 ENCOUNTER — Ambulatory Visit (INDEPENDENT_AMBULATORY_CARE_PROVIDER_SITE_OTHER): Payer: Medicare Other | Admitting: Gynecology

## 2013-03-26 DIAGNOSIS — N898 Other specified noninflammatory disorders of vagina: Secondary | ICD-10-CM

## 2013-03-26 LAB — WET PREP FOR TRICH, YEAST, CLUE: Yeast Wet Prep HPF POC: NONE SEEN

## 2013-03-26 LAB — URINALYSIS W MICROSCOPIC + REFLEX CULTURE
Glucose, UA: NEGATIVE mg/dL
Hgb urine dipstick: NEGATIVE
Leukocytes, UA: NEGATIVE
Nitrite: NEGATIVE
Specific Gravity, Urine: 1.015 (ref 1.005–1.030)
pH: 7 (ref 5.0–8.0)

## 2013-03-26 MED ORDER — TERCONAZOLE 0.8 % VA CREA: 1.0000 | TOPICAL_CREAM | Freq: Every day | VAGINAL | Status: DC

## 2013-03-26 MED ORDER — NYSTATIN-TRIAMCINOLONE 100000-0.1 UNIT/GM-% EX OINT: TOPICAL_OINTMENT | Freq: Two times a day (BID) | CUTANEOUS | Status: DC

## 2013-03-26 NOTE — Progress Notes (Signed)
Patient presents with several day history of vulvar itching and discharge. History of yeast vulva vaginitis in the past.  Exam of Sarah Mcguire Assistant External BUS vagina with atrophic changes. First degree cystocele noted. Scant discharge. Bimanual without masses or tenderness.  Assessment and plan: History consistent with yeast vulva vaginitis. Wet prep is negative, urinalysis is negative. Will cover with Mytrex externally twice a day when necessary and Terazol 3 day cream. Followup if symptoms persist, worsen or recur.

## 2013-03-26 NOTE — Patient Instructions (Signed)
Use Terazol cream nightly for 3 days. Apply Mytrex cream externally twice daily as needed for itching.

## 2013-03-30 ENCOUNTER — Telehealth: Payer: Self-pay | Admitting: Family Medicine

## 2013-03-30 NOTE — Telephone Encounter (Signed)
Patient husband is calling on the patient's behalf and says she is currently having pain in her abdomen and requested to see Dr. Beverely Low. Advised of no availability today and offered an appointment with another provider. She instead wants to know if she can take her Cipro medication? Please advise.

## 2013-03-30 NOTE — Telephone Encounter (Deleted)
Patient was questioning her Calcium level because the cardiologist had let her know today that her Calcium was fine. Reviewed last labs from 03/27/2013 Patient had a better understanding once labs were reviewed.  FYI-she is having a stress test in the morning.

## 2013-03-30 NOTE — Telephone Encounter (Signed)
Husband is calling back. Please advise.

## 2013-03-30 NOTE — Telephone Encounter (Signed)
Called and spoke with spouse. Advised that patient would need to be seen before what is needed could be prescribed. Patient scheduled for Tues 03/31/2013. Refused to see anyone but Tabori.

## 2013-03-31 ENCOUNTER — Telehealth: Payer: Self-pay | Admitting: *Deleted

## 2013-03-31 ENCOUNTER — Encounter: Payer: Self-pay | Admitting: Family Medicine

## 2013-03-31 ENCOUNTER — Ambulatory Visit (INDEPENDENT_AMBULATORY_CARE_PROVIDER_SITE_OTHER): Payer: Medicare Other | Admitting: Family Medicine

## 2013-03-31 VITALS — BP 130/90 | HR 112 | Temp 98.6°F | Ht 62.5 in | Wt 184.2 lb

## 2013-03-31 DIAGNOSIS — R1031 Right lower quadrant pain: Secondary | ICD-10-CM | POA: Insufficient documentation

## 2013-03-31 DIAGNOSIS — R109 Unspecified abdominal pain: Secondary | ICD-10-CM

## 2013-03-31 LAB — POCT URINALYSIS DIPSTICK
Bilirubin, UA: NEGATIVE
Blood, UA: NEGATIVE
Glucose, UA: NEGATIVE
Nitrite, UA: NEGATIVE
Urobilinogen, UA: 0.2

## 2013-03-31 MED ORDER — TERCONAZOLE 0.4 % VA CREA: 1.0000 | TOPICAL_CREAM | Freq: Every day | VAGINAL | Status: DC

## 2013-03-31 MED ORDER — TIZANIDINE HCL 2 MG PO TABS: 2.0000 mg | ORAL_TABLET | Freq: Three times a day (TID) | ORAL | Status: DC | PRN

## 2013-03-31 NOTE — Telephone Encounter (Signed)
Rx sent to pharmacy, pt informed.  

## 2013-03-31 NOTE — Patient Instructions (Addendum)
Start the Zanaflex (muscle relaxer preferred by insurance) nightly Apply heat or ice for pain relief Do some gentle stretching of the hip flexors Tylenol or vicodin as needed Call with any questions or concerns Hang in there!!!

## 2013-03-31 NOTE — Assessment & Plan Note (Signed)
New.  Suspect overuse injury due to recent increase in PT.  Start muscle relaxer prn.  Unable to take NSAIDs due to coumadin use.  Encouraged heat/ice, tylenol.  Gentle stretching.  Will follow.

## 2013-03-31 NOTE — Telephone Encounter (Signed)
terazol 7 day.  Applicator nightly for 7 days

## 2013-03-31 NOTE — Telephone Encounter (Signed)
Pt was seen on 03/26/13 given Rx for Terazol 3 day cream, pt took cream, but said some yeast still there. Rx did help, but not fully, pt asked if you would be willing to give another Rx.

## 2013-03-31 NOTE — Progress Notes (Signed)
  Subjective:    Patient ID: Sarah Mcguire, female    DOB: May 20, 1941, 73 y.o.   MRN: [id number]  HPI abd pain- pt has hx of similar.  sxs resolved while using the terconazole vaginal cream but returned after completion.  Pain is R sided, in the groin crease.  Not related to movement.  Denies urinary sxs- burning, frequency, urgency.  Does not feel a lump or bulge that would be consistent w/ hernia.  No pain w/ bearing down.  Pain described as a 'nagging'.  No nausea.  No diarrhea or constipation.  Pt has been doing increased PT for TKR including hip abduction and flexion.   Review of Systems For ROS see HPI     Objective:   Physical Exam  Vitals reviewed. Constitutional: She appears well-developed and well-nourished. No distress.  Cardiovascular: Intact distal pulses.   Abdominal: Soft. Bowel sounds are normal. She exhibits no distension. There is no tenderness. There is no rebound and no guarding.  Musculoskeletal: She exhibits edema (1+ LLE on L, no edema on R) and tenderness (TTP over R hip flexors).          Assessment & Plan:

## 2013-04-02 ENCOUNTER — Ambulatory Visit: Payer: Medicare Other | Admitting: Family Medicine

## 2013-04-02 LAB — URINE CULTURE
Colony Count: NO GROWTH
Organism ID, Bacteria: NO GROWTH

## 2013-04-06 ENCOUNTER — Ambulatory Visit (INDEPENDENT_AMBULATORY_CARE_PROVIDER_SITE_OTHER): Payer: Medicare Other | Admitting: Family Medicine

## 2013-04-06 ENCOUNTER — Telehealth: Payer: Self-pay | Admitting: *Deleted

## 2013-04-06 ENCOUNTER — Encounter: Payer: Self-pay | Admitting: Family Medicine

## 2013-04-06 VITALS — BP 112/70 | HR 100 | Temp 98.2°F | Ht 62.5 in | Wt 185.0 lb

## 2013-04-06 DIAGNOSIS — I4891 Unspecified atrial fibrillation: Secondary | ICD-10-CM

## 2013-04-06 DIAGNOSIS — I10 Essential (primary) hypertension: Secondary | ICD-10-CM

## 2013-04-06 DIAGNOSIS — E119 Type 2 diabetes mellitus without complications: Secondary | ICD-10-CM

## 2013-04-06 DIAGNOSIS — E785 Hyperlipidemia, unspecified: Secondary | ICD-10-CM

## 2013-04-06 LAB — LIPID PANEL
LDL Cholesterol: 85 mg/dL (ref 0–99)
Total CHOL/HDL Ratio: 4
Triglycerides: 165 mg/dL — ABNORMAL HIGH (ref 0.0–149.0)

## 2013-04-06 LAB — HEPATIC FUNCTION PANEL
ALT: 19 U/L (ref 0–35)
Alkaline Phosphatase: 45 U/L (ref 39–117)
Bilirubin, Direct: 0 mg/dL (ref 0.0–0.3)
Total Bilirubin: 0.2 mg/dL — ABNORMAL LOW (ref 0.3–1.2)
Total Protein: 7 g/dL (ref 6.0–8.3)

## 2013-04-06 LAB — BASIC METABOLIC PANEL
CO2: 30 mEq/L (ref 19–32)
Chloride: 103 mEq/L (ref 96–112)
Creatinine, Ser: 1 mg/dL (ref 0.4–1.2)
Potassium: 3.8 mEq/L (ref 3.5–5.1)
Sodium: 141 mEq/L (ref 135–145)

## 2013-04-06 LAB — HEMOGLOBIN A1C: Hgb A1c MFr Bld: 7.8 % — ABNORMAL HIGH (ref 4.6–6.5)

## 2013-04-06 NOTE — Assessment & Plan Note (Signed)
Chronic problem.  Well controlled.  Asymptomatic.  Check labs.  No anticipated changes. 

## 2013-04-06 NOTE — Patient Instructions (Addendum)
Follow up in 3 months to recheck sugars We'll notify you of your lab results and make any changes if needed Keep up the good work!  You look great! Happy Birthday!!!

## 2013-04-06 NOTE — Progress Notes (Signed)
  Subjective:    Patient ID: Sarah Mcguire, female    DOB: Mar 22, 1941, 72 y.o.   MRN: [id number]  HPI HTN- chronic problem.  Excellent control on Lisinopril, Lasix, Dilt.  No CP, SOB, HAs, visual changes, edema more than baseline.  Hyperlipidemia- chronic problem.  On Zocor daily.  Denies abd pain, N/V, myalgias.  DM- chronic problem, attempting to control w/ healthy diet and regular exercise.  Has never been on meds.  On ACE for renal protection.  UTD on eye exam.  CBGs running 110s-140s.  Having some symptomatic lows first thing in AM.   Review of Systems For ROS see HPI     Objective:   Physical Exam  Vitals reviewed. Constitutional: She is oriented to person, place, and time. She appears well-developed and well-nourished. No distress.  HENT:  Head: Normocephalic and atraumatic.  Eyes: Conjunctivae and EOM are normal. Pupils are equal, round, and reactive to light.  Neck: Normal range of motion. Neck supple. No thyromegaly present.  Cardiovascular: Normal rate, normal heart sounds and intact distal pulses.   No murmur heard. Irregular S1/S2  Pulmonary/Chest: Effort normal and breath sounds normal. No respiratory distress.  Abdominal: Soft. She exhibits no distension. There is no tenderness.  Musculoskeletal: She exhibits edema (1+ LLE edema).  Lymphadenopathy:    She has no cervical adenopathy.  Neurological: She is alert and oriented to person, place, and time.  Skin: Skin is warm and dry.  Psychiatric: She has a normal mood and affect. Her behavior is normal.          Assessment & Plan:

## 2013-04-06 NOTE — Telephone Encounter (Signed)
Spoke with pt after she called in with questions regarding her lab work today. Patient states she wants to go ahead and start meds for better A1c control. Advised pt that I would forward note to Dr. Beverely Low and we would send in script to [facility] on [address]

## 2013-04-06 NOTE — Assessment & Plan Note (Signed)
Pt's sugar readings are consistently higher than previously but still not on meds.  UTD on eye exam.  On ACE.  Check labs.  Adjust meds prn.

## 2013-04-06 NOTE — Assessment & Plan Note (Signed)
Chronic problem.  Tolerating statin w/out difficulty.  Check labs.  Adjust meds prn  

## 2013-04-06 NOTE — Telephone Encounter (Signed)
Ok to send Glucotrol XL 5mg  daily, #30, 3 refills

## 2013-04-06 NOTE — Assessment & Plan Note (Signed)
Pt w/ irregular rhythm today- EKG showed PAC but sinus rhythm.  No changes.

## 2013-04-07 ENCOUNTER — Encounter: Payer: Self-pay | Admitting: Family Medicine

## 2013-04-07 ENCOUNTER — Telehealth: Payer: Self-pay | Admitting: Family Medicine

## 2013-04-07 MED ORDER — METFORMIN HCL ER 500 MG PO TB24: ORAL_TABLET | ORAL | Status: DC

## 2013-04-07 NOTE — Telephone Encounter (Signed)
Spoke with the pt and informed her that we had to change the new rx that was to be called in due to possible cross reaction. Pt agreed. New rx changed to Glucophage XR 500mg and it was sent to the pharmacy by e-script.//AB/CMA  

## 2013-04-07 NOTE — Telephone Encounter (Signed)
Patient was seen in the office yesterday and states that a medication was supposed to be sent to her [facility] pharmacy for her diabetes. Patient wants to know when this will be done. Please advise.

## 2013-04-07 NOTE — Telephone Encounter (Signed)
Spoke with the pt and informed her that we had to change the new rx that was to be called in due to possible cross reaction. Pt agreed. New rx changed to Glucophage XR 500mg  and it was sent to the pharmacy by e-script.//AB/CMA

## 2013-04-08 ENCOUNTER — Ambulatory Visit: Payer: Medicare Other | Admitting: Family Medicine

## 2013-04-08 ENCOUNTER — Other Ambulatory Visit: Payer: Self-pay | Admitting: Gynecology

## 2013-04-08 ENCOUNTER — Telehealth: Payer: Self-pay

## 2013-04-08 MED ORDER — CLINDAMYCIN PHOSPHATE 2 % VA CREA: TOPICAL_CREAM | VAGINAL | Status: DC

## 2013-04-08 NOTE — Telephone Encounter (Signed)
Patient informed. Rx. In. Patient said her Coumadin coordinator told her that if all else fails she can adjust her Coumadin to where she could possibly take Diflucan but she would have to go in everyday for labs. I suggested she try this simpler solution first and call back if sx not relieved.

## 2013-04-08 NOTE — Telephone Encounter (Signed)
We could try Cleocin vaginal cream nightly x7 days which would treat a bacterial overgrowth. Terazol is really the best we have to offer for yeast but decreasing she cannot take the Diflucan due to her Coumadin. I would suggest trying a Cleocin vaginal cream nightly x7 days.

## 2013-04-08 NOTE — Telephone Encounter (Signed)
Patient was in on 03/26/13 and you treated her with Mytrex and Terazol. She wants you to know the medications have been no help. She questioned if you could prescribe something else or did she need to return for office visit?

## 2013-04-10 ENCOUNTER — Encounter: Payer: Self-pay | Admitting: Family Medicine

## 2013-04-13 ENCOUNTER — Telehealth: Payer: Self-pay | Admitting: Family Medicine

## 2013-04-13 ENCOUNTER — Ambulatory Visit: Payer: Medicare Other | Admitting: Family Medicine

## 2013-04-13 NOTE — Telephone Encounter (Signed)
Patient's husband called stating the patient needs new rx for Fenofibrate sent to [facility] on [address]. They have switched to this pharmacy permanently.

## 2013-04-14 ENCOUNTER — Ambulatory Visit (INDEPENDENT_AMBULATORY_CARE_PROVIDER_SITE_OTHER): Payer: Medicare Other | Admitting: Gynecology

## 2013-04-14 ENCOUNTER — Other Ambulatory Visit: Payer: Self-pay | Admitting: *Deleted

## 2013-04-14 ENCOUNTER — Encounter: Payer: Self-pay | Admitting: Gynecology

## 2013-04-14 DIAGNOSIS — L293 Anogenital pruritus, unspecified: Secondary | ICD-10-CM

## 2013-04-14 DIAGNOSIS — L292 Pruritus vulvae: Secondary | ICD-10-CM

## 2013-04-14 DIAGNOSIS — E781 Pure hyperglyceridemia: Secondary | ICD-10-CM

## 2013-04-14 LAB — WET PREP FOR TRICH, YEAST, CLUE: Yeast Wet Prep HPF POC: NONE SEEN

## 2013-04-14 MED ORDER — CLOBETASOL PROPIONATE 0.05 % EX CREA: TOPICAL_CREAM | CUTANEOUS | Status: DC

## 2013-04-14 MED ORDER — NYSTATIN-TRIAMCINOLONE 100000-0.1 UNIT/GM-% EX OINT: TOPICAL_OINTMENT | Freq: Two times a day (BID) | CUTANEOUS | Status: DC

## 2013-04-14 MED ORDER — FENOFIBRATE 160 MG PO TABS: ORAL_TABLET | ORAL | Status: DC

## 2013-04-14 NOTE — Telephone Encounter (Signed)
Rx for [facility]ofibrate sent to [facility] in [place]

## 2013-04-14 NOTE — Addendum Note (Signed)
Addended by: Dayna Barker on: 04/14/2013 04:44 PM   Modules accepted: Orders

## 2013-04-14 NOTE — Progress Notes (Signed)
Patient presents complaining of vulvar itching. Was recently treated with Terazol 3 day cream and Mytrex cream externally. Called back and was given another 7 days of Terazol. Said that the Mytrex cream seemed to help with the external itching more so than anything else but now it seems to be recurring as she is out of the cream.  Exam was [employer] External BUS vagina with atrophic changes. Scant white discharge. Bimanual without masses or discharge.  Assessment and plan: Recurrent vulvar itching. Wet prep suggestive of a low level bacterial vaginosis but she has no discharge or odor historically. Ongoing to cover the vulvar itching with refill of Mytrex cream to apply nightly and Temovate 0.05% cream to also apply nightly for 2 weeks and then wean. Assuming that she has resolution of her symptoms and she'll use when necessary. Followup if symptoms persist, worsen or recur.

## 2013-04-14 NOTE — Patient Instructions (Signed)
Apply Temovate steroid cream at bedtime and Mycolog cream as needed for itching

## 2013-04-16 ENCOUNTER — Ambulatory Visit: Payer: Medicare Other | Admitting: Family Medicine

## 2013-04-22 ENCOUNTER — Ambulatory Visit (INDEPENDENT_AMBULATORY_CARE_PROVIDER_SITE_OTHER): Payer: Medicare Other | Admitting: Family Medicine

## 2013-04-22 ENCOUNTER — Encounter: Payer: Self-pay | Admitting: Family Medicine

## 2013-04-22 VITALS — BP 118/74 | HR 92 | Temp 98.2°F | Ht 62.5 in | Wt 180.2 lb

## 2013-04-22 DIAGNOSIS — E119 Type 2 diabetes mellitus without complications: Secondary | ICD-10-CM

## 2013-04-22 DIAGNOSIS — I82409 Acute embolism and thrombosis of unspecified deep veins of unspecified lower extremity: Secondary | ICD-10-CM

## 2013-04-22 DIAGNOSIS — Z7901 Long term (current) use of anticoagulants: Secondary | ICD-10-CM

## 2013-04-22 DIAGNOSIS — I2699 Other pulmonary embolism without acute cor pulmonale: Secondary | ICD-10-CM

## 2013-04-22 LAB — BASIC METABOLIC PANEL
BUN: 27 mg/dL — ABNORMAL HIGH (ref 6–23)
CO2: 34 mEq/L — ABNORMAL HIGH (ref 19–32)
Chloride: 102 mEq/L (ref 96–112)
Creatinine, Ser: 1 mg/dL (ref 0.4–1.2)
Glucose, Bld: 126 mg/dL — ABNORMAL HIGH (ref 70–99)

## 2013-04-22 MED ORDER — HYDROCODONE-ACETAMINOPHEN 5-325 MG PO TABS: 1.0000 | ORAL_TABLET | ORAL | Status: DC | PRN

## 2013-04-22 MED ORDER — METFORMIN HCL ER 500 MG PO TB24: ORAL_TABLET | ORAL | Status: DC

## 2013-04-22 MED ORDER — GLUCOSE BLOOD VI STRP: ORAL_STRIP | Status: DC

## 2013-04-22 MED ORDER — WARFARIN SODIUM 5 MG PO TABS: 5.0000 mg | ORAL_TABLET | ORAL | Status: DC

## 2013-04-22 NOTE — Patient Instructions (Addendum)
Follow up as scheduled for your diabetes check It's ok if your sugars run high after your steroid injections You don't have to starve yourself- limit the carbs, not the protein Call with any questions or concerns Happy Labor Day!

## 2013-04-22 NOTE — Assessment & Plan Note (Signed)
Chronic problem.  Started on Metformin recently.  Tolerating this well.  Check BMP to assess renal fxn.  Refill provided.  Reviewed supportive care and red flags that should prompt return.  Pt expressed understanding and is in agreement w/ plan.

## 2013-04-22 NOTE — Progress Notes (Signed)
  Subjective:    Patient ID: Sarah Mcguire, female    DOB: [birth year], 72 y.o.   MRN: [id number]  HPI Med questions- [facility] [address]  Fears that scripts are 'screwed up'.  Needs scripts for Metformin 500mg  ER, Warfarin 5mg , Vicodin (#60), OneTouch Blue Test strips 100.    DM- started Metformin on 8/4, CBGs running 109-153.  Pt reports she has cut back on her carb intake and restarted cinnamon.  Pt upset that when she gets steroid injxns in her hip that her sugars go up.  Denies feeling shaky, dizzy, N/V/D, abd pain.   Review of Systems For ROS see HPI     Objective:   Physical Exam  Vitals reviewed. Constitutional: She is oriented to person, place, and time. She appears well-developed and well-nourished. No distress.  HENT:  Head: Normocephalic and atraumatic.  Neurological: She is alert and oriented to person, place, and time.  Skin: Skin is warm and dry.  Psychiatric: She has a normal mood and affect. Her behavior is normal. Thought content normal.          Assessment & Plan:

## 2013-05-20 ENCOUNTER — Ambulatory Visit (INDEPENDENT_AMBULATORY_CARE_PROVIDER_SITE_OTHER): Payer: Medicare Other | Admitting: General Practice

## 2013-05-20 DIAGNOSIS — I2699 Other pulmonary embolism without acute cor pulmonale: Secondary | ICD-10-CM

## 2013-05-20 DIAGNOSIS — I82409 Acute embolism and thrombosis of unspecified deep veins of unspecified lower extremity: Secondary | ICD-10-CM

## 2013-05-20 DIAGNOSIS — I82402 Acute embolism and thrombosis of unspecified deep veins of left lower extremity: Secondary | ICD-10-CM

## 2013-05-20 DIAGNOSIS — Z7901 Long term (current) use of anticoagulants: Secondary | ICD-10-CM

## 2013-05-20 LAB — POCT INR: INR: 3.1

## 2013-06-05 ENCOUNTER — Encounter: Payer: Self-pay | Admitting: Family Medicine

## 2013-06-15 ENCOUNTER — Ambulatory Visit (INDEPENDENT_AMBULATORY_CARE_PROVIDER_SITE_OTHER): Payer: Medicare Other | Admitting: General Practice

## 2013-06-15 DIAGNOSIS — I2699 Other pulmonary embolism without acute cor pulmonale: Secondary | ICD-10-CM

## 2013-06-15 DIAGNOSIS — I82409 Acute embolism and thrombosis of unspecified deep veins of unspecified lower extremity: Secondary | ICD-10-CM

## 2013-06-15 DIAGNOSIS — Z7901 Long term (current) use of anticoagulants: Secondary | ICD-10-CM

## 2013-06-19 ENCOUNTER — Other Ambulatory Visit: Payer: Self-pay | Admitting: Family Medicine

## 2013-06-19 NOTE — Telephone Encounter (Signed)
Last OV 04-22-13 Med filled 02-23-13 #90 with 3 refills  Pt old pharmacy closed needs new Rx.   Low Risk

## 2013-06-19 NOTE — Telephone Encounter (Signed)
Med faxed   

## 2013-07-09 ENCOUNTER — Other Ambulatory Visit: Payer: Self-pay

## 2013-07-13 ENCOUNTER — Ambulatory Visit (INDEPENDENT_AMBULATORY_CARE_PROVIDER_SITE_OTHER): Payer: Medicare Other | Admitting: Family Medicine

## 2013-07-13 ENCOUNTER — Encounter: Payer: Self-pay | Admitting: Family Medicine

## 2013-07-13 VITALS — BP 116/70 | HR 109 | Temp 98.4°F | Resp 16 | Wt 183.5 lb

## 2013-07-13 DIAGNOSIS — E1149 Type 2 diabetes mellitus with other diabetic neurological complication: Secondary | ICD-10-CM

## 2013-07-13 DIAGNOSIS — Z23 Encounter for immunization: Secondary | ICD-10-CM

## 2013-07-13 DIAGNOSIS — E1142 Type 2 diabetes mellitus with diabetic polyneuropathy: Secondary | ICD-10-CM

## 2013-07-13 DIAGNOSIS — E1140 Type 2 diabetes mellitus with diabetic neuropathy, unspecified: Secondary | ICD-10-CM

## 2013-07-13 LAB — MICROALBUMIN / CREATININE URINE RATIO
Creatinine,U: 20.9 mg/dL
Microalb Creat Ratio: 1.4 mg/g (ref 0.0–30.0)
Microalb, Ur: 0.3 mg/dL (ref 0.0–1.9)

## 2013-07-13 LAB — BASIC METABOLIC PANEL
CO2: 29 mEq/L (ref 19–32)
Calcium: 10 mg/dL (ref 8.4–10.5)
Creatinine, Ser: 0.9 mg/dL (ref 0.4–1.2)
GFR: 63.55 mL/min (ref >60.00)
Glucose, Bld: 117 mg/dL — ABNORMAL HIGH (ref 70–99)
Potassium: 3.8 mEq/L (ref 3.5–5.1)
Sodium: 141 mEq/L (ref 135–145)

## 2013-07-13 MED ORDER — HYDROCODONE-ACETAMINOPHEN 5-325 MG PO TABS: 1.0000 | ORAL_TABLET | ORAL | Status: DC | PRN

## 2013-07-13 NOTE — Patient Instructions (Signed)
Schedule your complete physical in 3 months We'll notify you of your lab results and make any changes if needed Keep up the good work! Call with any questions or concerns Happy Holidays!

## 2013-07-13 NOTE — Progress Notes (Signed)
  Subjective:    Patient ID: Sarah Mcguire, female    DOB: May 29, 1941, 72 y.o.   MRN: [id number]  HPI DM- chronic problem, on Metformin.  ACE for renal protection.  On Statin.  CBGs running 90-140s.  Denies symptomatic lows.  Pt reports fatigue after taking evening metformin but admits to increased activity during the days.  UTD on eye exam.  Denies CP, SOB, HAs, edema, N/V, abd pain, numbness/tingling above baseline neuropathy.   Review of Systems For ROS see HPI     Objective:   Physical Exam  Vitals reviewed. Constitutional: She is oriented to person, place, and time. She appears well-developed and well-nourished. No distress.  HENT:  Head: Normocephalic and atraumatic.  Eyes: Conjunctivae and EOM are normal. Pupils are equal, round, and reactive to light.  Neck: Normal range of motion. Neck supple. No thyromegaly present.  Cardiovascular: Normal rate, regular rhythm, normal heart sounds and intact distal pulses.   No murmur heard. Pulmonary/Chest: Effort normal and breath sounds normal. No respiratory distress.  Abdominal: Soft. She exhibits no distension. There is no tenderness.  Musculoskeletal: She exhibits edema (1+ bilaterally). She exhibits no tenderness.  Lymphadenopathy:    She has no cervical adenopathy.  Neurological: She is alert and oriented to person, place, and time.  Skin: Skin is warm and dry.  Psychiatric: She has a normal mood and affect. Her behavior is normal.          Assessment & Plan:

## 2013-07-13 NOTE — Assessment & Plan Note (Signed)
Chronic problem.  Tolerating metformin w/out difficulty since starting back in August.  UTD on eye exam.  Foot exam done today.  Check labs.  Adjust meds prn

## 2013-07-15 ENCOUNTER — Ambulatory Visit (INDEPENDENT_AMBULATORY_CARE_PROVIDER_SITE_OTHER): Payer: Medicare Other | Admitting: General Practice

## 2013-07-15 DIAGNOSIS — Z7901 Long term (current) use of anticoagulants: Secondary | ICD-10-CM

## 2013-07-15 LAB — POCT INR: INR: 2.7

## 2013-07-15 NOTE — Progress Notes (Signed)
Pre-visit discussion using our clinic review tool. No additional management support is needed unless otherwise documented below in the visit note.  

## 2013-07-23 ENCOUNTER — Telehealth: Payer: Self-pay | Admitting: Family Medicine

## 2013-07-23 NOTE — Telephone Encounter (Signed)
Mucinex DM or Delsym.

## 2013-07-23 NOTE — Telephone Encounter (Signed)
Pt.notified

## 2013-07-23 NOTE — Telephone Encounter (Signed)
Patient husband called and wanted to see if dr Beverely Low could call in a cough medication for his wife or recommend something over the counter to take. Patient husband states that his wife did not want to come in and that she is on warfarin.

## 2013-07-24 ENCOUNTER — Emergency Department (HOSPITAL_BASED_OUTPATIENT_CLINIC_OR_DEPARTMENT_OTHER): Payer: Medicare Other

## 2013-07-24 ENCOUNTER — Emergency Department (HOSPITAL_BASED_OUTPATIENT_CLINIC_OR_DEPARTMENT_OTHER)
Admission: EM | Admit: 2013-07-24 | Discharge: 2013-07-24 | Disposition: A | Discharge status: Home / Self Care | Payer: Medicare Other | Attending: Emergency Medicine | Admitting: Emergency Medicine

## 2013-07-24 ENCOUNTER — Encounter (HOSPITAL_BASED_OUTPATIENT_CLINIC_OR_DEPARTMENT_OTHER): Payer: Self-pay | Admitting: Emergency Medicine

## 2013-07-24 DIAGNOSIS — E785 Hyperlipidemia, unspecified: Secondary | ICD-10-CM | POA: Insufficient documentation

## 2013-07-24 DIAGNOSIS — F17200 Nicotine dependence, unspecified, uncomplicated: Secondary | ICD-10-CM | POA: Insufficient documentation

## 2013-07-24 DIAGNOSIS — Z8669 Personal history of other diseases of the nervous system and sense organs: Secondary | ICD-10-CM | POA: Insufficient documentation

## 2013-07-24 DIAGNOSIS — J40 Bronchitis, not specified as acute or chronic: Secondary | ICD-10-CM | POA: Insufficient documentation

## 2013-07-24 DIAGNOSIS — I10 Essential (primary) hypertension: Secondary | ICD-10-CM | POA: Insufficient documentation

## 2013-07-24 DIAGNOSIS — K219 Gastro-esophageal reflux disease without esophagitis: Secondary | ICD-10-CM | POA: Insufficient documentation

## 2013-07-24 DIAGNOSIS — D649 Anemia, unspecified: Secondary | ICD-10-CM | POA: Insufficient documentation

## 2013-07-24 DIAGNOSIS — Z79899 Other long term (current) drug therapy: Secondary | ICD-10-CM | POA: Insufficient documentation

## 2013-07-24 DIAGNOSIS — Z86718 Personal history of other venous thrombosis and embolism: Secondary | ICD-10-CM | POA: Insufficient documentation

## 2013-07-24 DIAGNOSIS — Z86711 Personal history of pulmonary embolism: Secondary | ICD-10-CM | POA: Insufficient documentation

## 2013-07-24 DIAGNOSIS — I4891 Unspecified atrial fibrillation: Secondary | ICD-10-CM | POA: Insufficient documentation

## 2013-07-24 DIAGNOSIS — J4489 Other specified chronic obstructive pulmonary disease: Secondary | ICD-10-CM | POA: Insufficient documentation

## 2013-07-24 DIAGNOSIS — Z7901 Long term (current) use of anticoagulants: Secondary | ICD-10-CM | POA: Insufficient documentation

## 2013-07-24 DIAGNOSIS — F411 Generalized anxiety disorder: Secondary | ICD-10-CM | POA: Insufficient documentation

## 2013-07-24 DIAGNOSIS — J449 Chronic obstructive pulmonary disease, unspecified: Secondary | ICD-10-CM | POA: Insufficient documentation

## 2013-07-24 DIAGNOSIS — M129 Arthropathy, unspecified: Secondary | ICD-10-CM | POA: Insufficient documentation

## 2013-07-24 NOTE — ED Notes (Signed)
Pt states head congestion, chest congestion for three to four days. Complaining of productive cough with clear sputum. Denies n/v/fever.

## 2013-07-24 NOTE — ED Notes (Signed)
Cough 4 days. Runny nose.

## 2013-07-24 NOTE — ED Notes (Signed)
In to see pt. And she is in restroom.

## 2013-07-24 NOTE — ED Provider Notes (Signed)
TIME SEEN: 12:17 PM  CHIEF COMPLAINT: Cough  HPI: Pt is a 72 y.o. female with a history of hypertension, hyperlipidemia, prior PE and DVT that is currently on Coumadin, diabetes, atrial fibrillation, COPD who presents emergency department with 2-3 days of cough and congestion. She's had a productive cough with clear sputum. No fevers. No chest pain or shortness of breath. No lower extremity swelling or pain. States her symptoms do not feel similar to her prior pulmonary embolus. Denies any sick contacts or recent hospitalization. No recent travel. She states she is here today because she had an episode of increased coughing while sleeping last night that kept her awake all night long. Her primary care doctor did call her in a prescription for Mucinex and Delsym.  ROS: See HPI Constitutional: no fever  Eyes: no drainage  ENT: no runny nose   Cardiovascular:  no chest pain  Resp: no SOB  GI: no vomiting GU: no dysuria Integumentary: no rash  Allergy: no hives  Musculoskeletal: no leg swelling  Neurological: no slurred speech ROS otherwise negative  PAST MEDICAL HISTORY/PAST SURGICAL HISTORY:  Past Medical History  Diagnosis Date  . Hyperlipidemia   . Hypertension   . Hyperglycemia   . GERD (gastroesophageal reflux disease)   . Anemia   . Leukocytosis   . Diverticulitis   . Neuropathy   . Anxiety     SEVERE  . 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 DVT (deep venous thrombosis)   . Diabetes mellitus     borderline no medications  . Arthritis   . COPD (chronic obstructive pulmonary disease)   . Atrial fibrillation with RVR 11/28/2012    MEDICATIONS:  Prior to Admission medications   Medication Sig Start Date End Date Taking? Authorizing Provider  alprazolam (XANAX) 2 MG tablet TAKE ONE HALF (1/2) TO 1 TABLET BY MOUTH 3 TIMES A DAY AS NEEDED FOR ANXIETY/SLEEP 06/19/13  Yes  Sheliah Hatch, MD  Cholecalciferol (VITAMIN D) 2000 UNITS tablet Take 2,000 Units by mouth daily.   Yes Historical Provider, MD  CINNAMON PO Take 2,000 mg by mouth 3 (three) times daily.   Yes Historical Provider, MD  dextromethorphan (DELSYM) 30 MG/5ML liquid Take 15 mg by mouth at bedtime as needed for cough.   Yes Historical Provider, MD  dextromethorphan-guaiFENesin (MUCINEX DM) 30-600 MG per 12 hr tablet Take 1 tablet by mouth 2 (two) times daily.   Yes Historical Provider, MD  diltiazem (CARDIZEM CD) 120 MG 24 hr capsule Take 1 capsule (120 mg total) by mouth daily. 12/25/12  Yes Wendall Stade, MD  esomeprazole (NEXIUM) 40 MG capsule Take 40 mg by mouth daily as needed. For acid reflux. 07/09/11  Yes Sheliah Hatch, MD  fenofibrate 160 MG tablet TAKE 1 TABLET (160 MG TOTAL)  BY MOUTH DAILY. 04/14/13  Yes Sheliah Hatch, MD  folic acid (FOLVITE) 1 MG tablet Take 1 mg by mouth daily.   Yes Historical Provider, MD  furosemide (LASIX) 40 MG tablet Take 1 tablet (40 mg total) by mouth daily. 12/25/12  Yes Wendall Stade, MD  glucose blood (ONE TOUCH ULTRA TEST) test strip Test twice daily as directed 04/22/13  Yes Sheliah Hatch, MD  HYDROcodone-acetaminophen (NORCO/VICODIN) 5-325 MG per tablet Take 1-2 tablets by mouth every 4 (four) hours as needed. 07/13/13  Yes Sheliah Hatch, MD  lidocaine (LIDODERM) 5 % Place  1 patch onto the skin daily. Remove & Discard patch within 12 hours or as directed by MD   Yes Historical Provider, MD  lisinopril (PRINIVIL,ZESTRIL) 5 MG tablet Take 1 tablet (5 mg total) by mouth daily. 12/25/12  Yes Wendall Stade, MD  metFORMIN (GLUCOPHAGE XR) 500 MG 24 hr tablet Take 1 tablet (500mg  total) by mouth daily with largest meal. 04/22/13  Yes Sheliah Hatch, MD  Multiple Vitamin (MULTIVITAMIN) tablet Take 1 tablet by mouth daily.     Yes Historical Provider, MD  nystatin-triamcinolone ointment (MYCOLOG) Apply topically 2 (two) times daily. 04/14/13  Yes  Dara Lords, MD  simvastatin (ZOCOR) 20 MG tablet Take 20 mg by mouth every evening.   Yes Historical Provider, MD  warfarin (COUMADIN) 5 MG tablet Take 1 tablet (5 mg total) by mouth See admin instructions. Patient takes 5 mg daily, except for Wednesdays-takes 2.5mg . 04/22/13  Yes Sheliah Hatch, MD  acetaminophen (TYLENOL) 325 MG tablet Take 325 mg by mouth every 6 (six) hours as needed (for headache).    Historical Provider, MD  clobetasol cream (TEMOVATE) 0.05 % Apply at bedtime for itching 04/14/13   Dara Lords, MD  [facility] DELICA LANCETS 33G MISC  04/27/13   Historical Provider, MD  OVER THE COUNTER MEDICATION Take 1 tablet by mouth daily. citracal +D 630/500    Historical Provider, MD  OVER THE COUNTER MEDICATION Place 1 spray into the nose every 6 (six) hours as needed (for congestion). Phenylephrine 1% nasal spray    Historical Provider, MD  PATADAY 0.2 % SOLN  06/03/13   Historical Provider, MD  tiZANidine (ZANAFLEX) 2 MG tablet Take 1 tablet (2 mg total) by mouth every 8 (eight) hours as needed. 03/31/13   Sheliah Hatch, MD    ALLERGIES:  Allergies  Allergen Reactions  . Morphine And Related     Usually drops her blood pressure  . Amoxicillin Other (See Comments)    Unknown   . Amoxicillin-Pot Clavulanate Other (See Comments)    Unknown   . Citalopram Hydrobromide Other (See Comments)    Itching   . Clonazepam Itching  . Cymbalta [Duloxetine Hcl] Other (See Comments)    Nausea, headache and diarrhea   . Macrobid [Nitrofurantoin Macrocrystal] Other (See Comments)    Nausea, vomitting  . Penicillins Other (See Comments)    Unknown   . Pyridium [Phenazopyridine Hcl] Other (See Comments)    headache  . Zonegran Other (See Comments)    Unknown   . Zonisamide Other (See Comments)    Unknown   . Sertraline Hcl Anxiety  . Wellbutrin [Bupropion] Itching and Rash    Itching, rash, hyper     SOCIAL HISTORY:  History  Substance Use Topics  .  Smoking status: Current Every Day Smoker -- 0.80 packs/day    Types: Cigarettes  . Smokeless tobacco: Never Used  . Alcohol Use: No    FAMILY HISTORY: Family History  Problem Relation Age of Onset  . Diabetes Mother   . Hypertension Mother   . Heart disease Father     MI age 82's   . Diabetes Brother   . Hypertension Brother     EXAM: BP 133/96  Pulse 94  Temp(Src) 98.6 F (37 C) (Oral)  Resp 20  Ht 5\' 3"  (1.6 m)  Wt 179 lb (81.194 kg)  BMI 31.72 kg/m2  SpO2 93% CONSTITUTIONAL: Alert and oriented and responds appropriately to questions. Well-appearing; well-nourished HEAD: Normocephalic EYES: Conjunctivae clear,  PERRL ENT: normal nose; no rhinorrhea; moist mucous membranes; pharynx without lesions noted NECK: Supple, no meningismus, no LAD  CARD: RRR; S1 and S2 appreciated; no murmurs, no clicks, no rubs, no gallops RESP: Normal chest excursion without splinting or tachypnea; breath sounds clear and equal bilaterally; no wheezes, no rhonchi, no rales,  ABD/GI: Normal bowel sounds; non-distended; soft, non-tender, no rebound, no guarding BACK:  The back appears normal and is non-tender to palpation, there is no CVA tenderness EXT: Normal ROM in all joints; non-tender to palpation; no edema; normal capillary refill; no cyanosis    SKIN: Normal color for age and race; warm NEURO: Moves all extremities equally PSYCH: The patient's mood and manner are appropriate. Grooming and personal hygiene are appropriate.  MEDICAL DECISION MAKING: Patient here with likely viral illness. She is hemodynamically stable, well-appearing, lungs are clear to auscultation, oxygen saturation upon my evaluation is greater than 95% on room air. She is no respiratory distress. Chest x-ray shows bronchitic changes. No pneumonia, edema. Given she is having no chest pain or shortness of breath and is well-appearing, do not feel she needs labs, EKG at this time. Discussed with patient and husband at length  that we could start her on azithromycin or doxycycline for possible bacterial causes of her bronchitis. They are very concerned that this may increase her INR and would like to hold on antibiotics at this time which I feel is reasonable. They will followup with her primary care physician on Monday for reevaluation. Given return precautions. Patient and husband at bedside verbalize understanding and are comfortable with plan.      Layla Maw Ward, DO 07/24/13 1341

## 2013-07-27 ENCOUNTER — Encounter: Payer: Self-pay | Admitting: Family Medicine

## 2013-07-27 ENCOUNTER — Ambulatory Visit (INDEPENDENT_AMBULATORY_CARE_PROVIDER_SITE_OTHER): Payer: Medicare Other | Admitting: Family Medicine

## 2013-07-27 VITALS — BP 130/82 | HR 96 | Temp 99.0°F | Resp 16

## 2013-07-27 DIAGNOSIS — J069 Acute upper respiratory infection, unspecified: Secondary | ICD-10-CM

## 2013-07-27 LAB — HM DIABETES EYE EXAM: HM Diabetic Eye Exam: NEGATIVE

## 2013-07-27 MED ORDER — BENZONATATE 200 MG PO CAPS: 200.0000 mg | ORAL_CAPSULE | Freq: Three times a day (TID) | ORAL | Status: DC | PRN

## 2013-07-27 NOTE — Assessment & Plan Note (Signed)
New.  Pt's sxs consistent w/ viral illness.  No evidence of bacterial infxn on PE.  No need for abx.  Cough meds prn.  Reviewed supportive care and red flags that should prompt return.  Pt expressed understanding and is in agreement w/ plan.

## 2013-07-27 NOTE — Progress Notes (Signed)
  Subjective:    Patient ID: Sarah Mcguire, female    DOB: [birth year], 72 y.o.   MRN: [id number]  HPI Pre visit review using our clinic review tool, if applicable. No additional management support is needed unless otherwise documented below in the visit note.  Hospital f/u- pt was seen in ER on 11/21 and dx'd w/ viral illness.  Had CXR done- negative for acute process.  On Delsym and Mucinex DM.  Pt reports 'it's breaking up, I'm doing a lot of coughing'.  No sinus pain/pressure.  No ear pain.  No N/V/D.  No SOB or wheezing.  No known sick contacts.   Review of Systems For ROS see HPI     Objective:   Physical Exam  Vitals reviewed. Constitutional: She appears well-developed and well-nourished. No distress.  HENT:  Head: Normocephalic and atraumatic.  TMs normal bilaterally Mild nasal congestion Throat w/out erythema, edema, or exudate  Eyes: Conjunctivae and EOM are normal. Pupils are equal, round, and reactive to light.  Neck: Normal range of motion. Neck supple.  Cardiovascular: Normal rate, regular rhythm, normal heart sounds and intact distal pulses.   No murmur heard. Pulmonary/Chest: Effort normal and breath sounds normal. No respiratory distress. She has no wheezes.  + hacking cough  Lymphadenopathy:    She has no cervical adenopathy.          Assessment & Plan:

## 2013-07-27 NOTE — Patient Instructions (Signed)
Follow up as needed I agree that this is a viral illness that will continue to improve w/ time Drink plenty of fluids REST! Continue the Delsym as needed Add the Tessalon for cough Call with any questions or concerns Happy Thanksgiving!!!!

## 2013-07-28 ENCOUNTER — Encounter: Payer: Self-pay | Admitting: General Practice

## 2013-08-03 ENCOUNTER — Ambulatory Visit (INDEPENDENT_AMBULATORY_CARE_PROVIDER_SITE_OTHER): Payer: Medicare Other | Admitting: Family Medicine

## 2013-08-03 ENCOUNTER — Encounter: Payer: Self-pay | Admitting: Family Medicine

## 2013-08-03 VITALS — BP 120/80 | HR 103 | Temp 98.2°F | Resp 16 | Wt 181.4 lb

## 2013-08-03 DIAGNOSIS — F411 Generalized anxiety disorder: Secondary | ICD-10-CM

## 2013-08-03 DIAGNOSIS — E1140 Type 2 diabetes mellitus with diabetic neuropathy, unspecified: Secondary | ICD-10-CM

## 2013-08-03 DIAGNOSIS — E1149 Type 2 diabetes mellitus with other diabetic neurological complication: Secondary | ICD-10-CM

## 2013-08-03 DIAGNOSIS — J069 Acute upper respiratory infection, unspecified: Secondary | ICD-10-CM

## 2013-08-03 DIAGNOSIS — H269 Unspecified cataract: Secondary | ICD-10-CM

## 2013-08-03 DIAGNOSIS — E1142 Type 2 diabetes mellitus with diabetic polyneuropathy: Secondary | ICD-10-CM

## 2013-08-03 DIAGNOSIS — F419 Anxiety disorder, unspecified: Secondary | ICD-10-CM

## 2013-08-03 HISTORY — PX: CATARACT EXTRACTION W/ INTRAOCULAR LENS  IMPLANT, BILATERAL: SHX1307

## 2013-08-03 NOTE — Progress Notes (Signed)
   Subjective:    Patient ID: Sarah Mcguire, female    DOB: Dec 13, 1940, 72 y.o.   MRN: [id number]  HPI Pre visit review using our clinic review tool, if applicable. No additional management support is needed unless otherwise documented below in the visit note.  Cataract surgery- has pre-op appt on Wednesday and is scheduled for R eye on 12/10, L eye on 12/17.  Surgery scheduled at [facility].  Pt is very worried about this b/c her neighbor 'yelled at me, 'he'll make you go blind''.  DM- pt is concerned about diabetes medication w/ upcoming surgery.  'what should i do?'   Review of Systems For ROS see HPI     Objective:   Physical Exam  Vitals reviewed. Constitutional: She is oriented to person, place, and time. She appears well-developed and well-nourished. No distress.  HENT:  Head: Normocephalic and atraumatic.  Eyes: Conjunctivae and EOM are normal. Pupils are equal, round, and reactive to light.  Neck: Normal range of motion. Neck supple. No thyromegaly present.  Cardiovascular: Normal rate, regular rhythm, normal heart sounds and intact distal pulses.   No murmur heard. Pulmonary/Chest: Effort normal and breath sounds normal. No respiratory distress.  Abdominal: Soft. She exhibits no distension. There is no tenderness.  Musculoskeletal: She exhibits edema (trace edema bilaterally, L>R).  Lymphadenopathy:    She has no cervical adenopathy.  Neurological: She is alert and oriented to person, place, and time.  Skin: Skin is warm and dry.  Psychiatric: She has a normal mood and affect. Her behavior is normal.          Assessment & Plan:

## 2013-08-03 NOTE — Patient Instructions (Signed)
Follow up as scheduled Hold the Metformin the night before surgery Either hold the Lasix or take it when you get home from surgery DO NOT worry about your sugars the day of surgery- we expect them to be high Call with any questions or concerns GOOD LUCK!!!

## 2013-08-04 NOTE — Assessment & Plan Note (Signed)
Chronic problem.  Pt very anxious about her upcoming cataract surgery.  Tried to reassure her and discussed how to take meds the day before and the day of surgery.  Pt seems to feel better after our discussion.

## 2013-08-04 NOTE — Assessment & Plan Note (Signed)
Reassured pt that her surgeon is well qualified and as long as she feels comfortable with him, there is no reason to believe her neighbor.  Pt felt better about this.

## 2013-08-04 NOTE — Assessment & Plan Note (Signed)
Resolving.  Lungs clear on PE.  No need to hold surgery.

## 2013-08-04 NOTE — Assessment & Plan Note (Signed)
Pt to hold Metformin the night before surgery since she will be NPO after midnight.  Suggested that unless she is feeling symptomatic lows that she not check her sugar b/c this will only upset her if it's high.

## 2013-08-13 ENCOUNTER — Ambulatory Visit (INDEPENDENT_AMBULATORY_CARE_PROVIDER_SITE_OTHER): Payer: Medicare Other | Admitting: General Practice

## 2013-08-13 DIAGNOSIS — I82409 Acute embolism and thrombosis of unspecified deep veins of unspecified lower extremity: Secondary | ICD-10-CM

## 2013-08-13 DIAGNOSIS — Z7901 Long term (current) use of anticoagulants: Secondary | ICD-10-CM

## 2013-08-13 DIAGNOSIS — I2699 Other pulmonary embolism without acute cor pulmonale: Secondary | ICD-10-CM

## 2013-08-13 LAB — POCT INR: INR: 3.4

## 2013-08-13 NOTE — Progress Notes (Signed)
Pre-visit discussion using our clinic review tool. No additional management support is needed unless otherwise documented below in the visit note.  

## 2013-08-14 ENCOUNTER — Other Ambulatory Visit: Payer: Self-pay | Admitting: Family Medicine

## 2013-08-14 NOTE — Telephone Encounter (Signed)
Last OV 08-03-13 Med filled 10-17 #90 with 1

## 2013-08-14 NOTE — Telephone Encounter (Signed)
Med filled and faxed.  

## 2013-08-17 ENCOUNTER — Other Ambulatory Visit: Payer: Self-pay | Admitting: Family Medicine

## 2013-08-18 NOTE — Telephone Encounter (Signed)
Med filled.  

## 2013-08-24 ENCOUNTER — Ambulatory Visit (INDEPENDENT_AMBULATORY_CARE_PROVIDER_SITE_OTHER): Payer: Medicare Other | Admitting: General Practice

## 2013-08-24 DIAGNOSIS — I2699 Other pulmonary embolism without acute cor pulmonale: Secondary | ICD-10-CM

## 2013-08-24 DIAGNOSIS — Z7901 Long term (current) use of anticoagulants: Secondary | ICD-10-CM

## 2013-08-24 DIAGNOSIS — I82409 Acute embolism and thrombosis of unspecified deep veins of unspecified lower extremity: Secondary | ICD-10-CM

## 2013-08-24 NOTE — Progress Notes (Signed)
Pre-visit discussion using our clinic review tool. No additional management support is needed unless otherwise documented below in the visit note.  

## 2013-08-31 ENCOUNTER — Telehealth: Payer: Self-pay | Admitting: Family Medicine

## 2013-08-31 MED ORDER — HYDROCODONE-ACETAMINOPHEN 5-325 MG PO TABS: 1.0000 | ORAL_TABLET | ORAL | Status: DC | PRN

## 2013-08-31 NOTE — Telephone Encounter (Signed)
Ok for #60 

## 2013-08-31 NOTE — Telephone Encounter (Signed)
Patient's husband called requesting rx for hydrocodone. Call [phone] when ready for pick up

## 2013-08-31 NOTE — Telephone Encounter (Signed)
Last OV 08-03-13 Med filled 07-13-13 #60 with 0  Low Risk

## 2013-08-31 NOTE — Telephone Encounter (Signed)
Med filled.  

## 2013-09-14 ENCOUNTER — Encounter: Payer: Self-pay | Admitting: Family Medicine

## 2013-09-18 ENCOUNTER — Ambulatory Visit (INDEPENDENT_AMBULATORY_CARE_PROVIDER_SITE_OTHER): Payer: Medicare Other | Admitting: General Practice

## 2013-09-18 DIAGNOSIS — I82409 Acute embolism and thrombosis of unspecified deep veins of unspecified lower extremity: Secondary | ICD-10-CM

## 2013-09-18 DIAGNOSIS — I2699 Other pulmonary embolism without acute cor pulmonale: Secondary | ICD-10-CM

## 2013-09-18 DIAGNOSIS — Z7901 Long term (current) use of anticoagulants: Secondary | ICD-10-CM

## 2013-09-18 LAB — POCT INR: INR: 2.3

## 2013-09-18 NOTE — Progress Notes (Signed)
Pre-visit discussion using our clinic review tool. No additional management support is needed unless otherwise documented below in the visit note.  

## 2013-09-21 ENCOUNTER — Ambulatory Visit: Payer: Medicare Other

## 2013-09-25 ENCOUNTER — Encounter: Payer: Self-pay | Admitting: Family Medicine

## 2013-09-25 ENCOUNTER — Ambulatory Visit (INDEPENDENT_AMBULATORY_CARE_PROVIDER_SITE_OTHER): Payer: Medicare Other | Admitting: Family Medicine

## 2013-09-25 VITALS — BP 120/78 | HR 101 | Temp 98.2°F | Resp 16 | Wt 185.4 lb

## 2013-09-25 DIAGNOSIS — R609 Edema, unspecified: Secondary | ICD-10-CM

## 2013-09-25 LAB — BASIC METABOLIC PANEL
BUN: 24 mg/dL — ABNORMAL HIGH (ref 6–23)
CHLORIDE: 102 meq/L (ref 96–112)
CO2: 31 meq/L (ref 19–32)
CREATININE: 1.1 mg/dL (ref 0.4–1.2)
Calcium: 9.8 mg/dL (ref 8.4–10.5)
GFR: 52.79 mL/min — ABNORMAL LOW (ref >60.00)
Glucose, Bld: 203 mg/dL — ABNORMAL HIGH (ref 70–99)
Potassium: 3.6 mEq/L (ref 3.5–5.1)
Sodium: 141 mEq/L (ref 135–145)

## 2013-09-25 MED ORDER — BUMETANIDE 1 MG PO TABS: 1.0000 mg | ORAL_TABLET | Freq: Two times a day (BID) | ORAL | Status: DC

## 2013-09-25 MED ORDER — FOLIC ACID 1 MG PO TABS: 1.0000 mg | ORAL_TABLET | Freq: Every day | ORAL | Status: DC

## 2013-09-25 MED ORDER — HYDROCODONE-ACETAMINOPHEN 5-325 MG PO TABS: 1.0000 | ORAL_TABLET | ORAL | Status: DC | PRN

## 2013-09-25 NOTE — Patient Instructions (Signed)
Follow up as scheduled Start the [facility] once daily until the next appt- we may increase to twice daily at next visit STOP the lasix We'll notify you of your lab results Call with any questions or concerns Hang in there!

## 2013-09-25 NOTE — Progress Notes (Signed)
Pre visit review using our clinic review tool, if applicable. No additional management support is needed unless otherwise documented below in the visit note. 

## 2013-09-25 NOTE — Progress Notes (Signed)
   Subjective:    Patient ID: Sarah Mcguire, female    DOB: [birth year], 73 y.o.   MRN: [id number]  HPI Edema- chronic problem, pt saw ortho and they recommended increasing diuretic dose b/c 'he thinks i have about 10 lbs of fluid on there'.  Reports lasix will work well initially- 'i pee a lot the first 4 hrs but then it stops'.  Denies CP, SOB, cough, wheezing.   Review of Systems For ROS see HPI     Objective:   Physical Exam  Vitals reviewed. Constitutional: She is oriented to person, place, and time. She appears well-developed and well-nourished. No distress.  HENT:  Head: Normocephalic and atraumatic.  Eyes: Conjunctivae and EOM are normal. Pupils are equal, round, and reactive to light.  Neck: Normal range of motion. Neck supple. No thyromegaly present.  Cardiovascular: Normal rate, regular rhythm, normal heart sounds and intact distal pulses.   No murmur heard. Pulmonary/Chest: Effort normal and breath sounds normal. No respiratory distress.  Abdominal: Soft. She exhibits no distension. There is no tenderness.  Musculoskeletal: She exhibits edema (1+ on R, 2+ on L) and tenderness.  Lymphadenopathy:    She has no cervical adenopathy.  Neurological: She is alert and oriented to person, place, and time.  Skin: Skin is warm and dry.  Psychiatric: She has a normal mood and affect. Her behavior is normal.          Assessment & Plan:

## 2013-09-27 NOTE — Assessment & Plan Note (Signed)
3 options- increase once daily Lasix dose, change dose to BID, switch diuretics entirely.  Pt not interested in twice daily dosing.  Wants to try switching diuretics to improve swelling.  Check BMP.  Will follow closely

## 2013-09-28 ENCOUNTER — Telehealth: Payer: Self-pay | Admitting: *Deleted

## 2013-09-28 ENCOUNTER — Telehealth: Payer: Self-pay | Admitting: Family Medicine

## 2013-09-28 NOTE — Telephone Encounter (Signed)
Pt husband states that she was in the bathroom every 10-15 minutes states that he cannot do another day like that. Caused sever muscle aches could not wear compression hose. Mouth was very dry. Did not give medication to pt today.

## 2013-09-28 NOTE — Telephone Encounter (Signed)
Spoke with pt and she advised that she would try the 1/2 pill and see how it goes. She was just concerned about her potassium.

## 2013-09-28 NOTE — Telephone Encounter (Signed)
The only way to diurese fluid is to urinate.  The more fluid accumulating in the tissues, the more she will pee until that fluid is gone.  Break the tab in half for now.  This is what she was hoping for at time of office visit.

## 2013-09-28 NOTE — Telephone Encounter (Signed)
Yes- continue the medication.  That was the point of switching to this medication- this is the way to get the fluid off.  The excessive urination will eventually slow down as the fluid is removed.  That means it's working!

## 2013-09-28 NOTE — Telephone Encounter (Signed)
Patient husband called and stated that dr Birdie Riddle put his wife on bumetanide (BUMEX) 1 MG tablet and all weekend she had to keep going to the bathroom urinating more frequently. He would like to know if his wife should still take the medication or switch to another medicine.

## 2013-09-28 NOTE — Telephone Encounter (Signed)
Relevant patient education assigned to patient using Emmi. ° °

## 2013-09-28 NOTE — Telephone Encounter (Signed)
We will be following her K+ at her next OV.  She can add an OTC potassium supplement if worried

## 2013-09-30 ENCOUNTER — Encounter: Payer: Self-pay | Admitting: Family Medicine

## 2013-10-08 ENCOUNTER — Telehealth: Payer: Self-pay

## 2013-10-08 NOTE — Telephone Encounter (Signed)
Medication and allergies:  Reviewed and updated  90 day supply/mail order: n/a [facility] [address]   Immunizations due:  UTD   A/P: No changes to personal, family history or past surgical hx PAP- 06/06/09-negative CCS- 09/07/08-diverticulosis; repeat in 10 years. MMG- DUE- patient does not wish to schedule one at this time.  BD-approx. 3 years ago per patient Flu- 07/13/13 Tdap- 07/30/08 PNA- 06/03/08 Shingles- 07/27/12  To Discuss with Provider: Not at this time.

## 2013-10-12 ENCOUNTER — Emergency Department (HOSPITAL_BASED_OUTPATIENT_CLINIC_OR_DEPARTMENT_OTHER)
Admission: EM | Admit: 2013-10-12 | Discharge: 2013-10-12 | Disposition: A | Discharge status: Home / Self Care | Payer: Medicare Other | Attending: Emergency Medicine | Admitting: Emergency Medicine

## 2013-10-12 ENCOUNTER — Encounter (HOSPITAL_BASED_OUTPATIENT_CLINIC_OR_DEPARTMENT_OTHER): Payer: Self-pay | Admitting: Emergency Medicine

## 2013-10-12 ENCOUNTER — Encounter: Payer: Medicare Other | Admitting: Family Medicine

## 2013-10-12 DIAGNOSIS — J449 Chronic obstructive pulmonary disease, unspecified: Secondary | ICD-10-CM | POA: Insufficient documentation

## 2013-10-12 DIAGNOSIS — Z86718 Personal history of other venous thrombosis and embolism: Secondary | ICD-10-CM | POA: Insufficient documentation

## 2013-10-12 DIAGNOSIS — Z880 Allergy status to penicillin: Secondary | ICD-10-CM | POA: Insufficient documentation

## 2013-10-12 DIAGNOSIS — F17200 Nicotine dependence, unspecified, uncomplicated: Secondary | ICD-10-CM | POA: Insufficient documentation

## 2013-10-12 DIAGNOSIS — R609 Edema, unspecified: Secondary | ICD-10-CM | POA: Insufficient documentation

## 2013-10-12 DIAGNOSIS — K219 Gastro-esophageal reflux disease without esophagitis: Secondary | ICD-10-CM | POA: Insufficient documentation

## 2013-10-12 DIAGNOSIS — Z7901 Long term (current) use of anticoagulants: Secondary | ICD-10-CM | POA: Insufficient documentation

## 2013-10-12 DIAGNOSIS — E1142 Type 2 diabetes mellitus with diabetic polyneuropathy: Secondary | ICD-10-CM | POA: Insufficient documentation

## 2013-10-12 DIAGNOSIS — M129 Arthropathy, unspecified: Secondary | ICD-10-CM | POA: Insufficient documentation

## 2013-10-12 DIAGNOSIS — F411 Generalized anxiety disorder: Secondary | ICD-10-CM | POA: Insufficient documentation

## 2013-10-12 DIAGNOSIS — Z79899 Other long term (current) drug therapy: Secondary | ICD-10-CM | POA: Insufficient documentation

## 2013-10-12 DIAGNOSIS — E785 Hyperlipidemia, unspecified: Secondary | ICD-10-CM | POA: Insufficient documentation

## 2013-10-12 DIAGNOSIS — J4489 Other specified chronic obstructive pulmonary disease: Secondary | ICD-10-CM | POA: Insufficient documentation

## 2013-10-12 DIAGNOSIS — Z86711 Personal history of pulmonary embolism: Secondary | ICD-10-CM | POA: Insufficient documentation

## 2013-10-12 DIAGNOSIS — E1149 Type 2 diabetes mellitus with other diabetic neurological complication: Secondary | ICD-10-CM | POA: Insufficient documentation

## 2013-10-12 DIAGNOSIS — I10 Essential (primary) hypertension: Secondary | ICD-10-CM | POA: Insufficient documentation

## 2013-10-12 DIAGNOSIS — D649 Anemia, unspecified: Secondary | ICD-10-CM | POA: Insufficient documentation

## 2013-10-12 NOTE — Discharge Instructions (Signed)

## 2013-10-12 NOTE — ED Notes (Signed)
Pt c/o BP "high at home". Denies symptoms.

## 2013-10-12 NOTE — ED Provider Notes (Signed)
CSN: [id number]     Arrival date & time 10/12/13  1349 History   First MD Initiated Contact with Patient 10/12/13 1434     Chief Complaint  Patient presents with  . Hypertension     (Consider location/radiation/quality/duration/timing/severity/associated sxs/prior Treatment) HPI Comments: Patient presents with high blood pressure. She denies any symptoms but she says she routinely checks her blood pressure from time to time and when she checked it with her machine at home she noted it to be high. She had a reading of 140/102 and 159/104. She denies any headache or dizziness. She denies any chest pain or shortness of breath. She has some neuropathy in her feet but denies any new numbness or weakness in her extremities. She denies any ataxia.   Past Medical History  Diagnosis Date  . Hyperlipidemia   . Hypertension   . Hyperglycemia   . GERD (gastroesophageal reflux disease)   . Anemia   . Leukocytosis   . Diverticulitis   . Neuropathy   . Anxiety     SEVERE  . 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 DVT (deep venous thrombosis)   . Diabetes mellitus     borderline no medications  . Arthritis   . COPD (chronic obstructive pulmonary disease)   . Atrial fibrillation with RVR 11/28/2012   Past Surgical History  Procedure Laterality Date  . Tonsillectomy    . Tympanostomy tube placement      R ear  . 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 Diabetes Mother   . Hypertension Mother   . Heart disease Father     MI age 73's   . Diabetes Brother   . Hypertension Brother    History  Substance Use Topics  . Smoking status: Current Every Day Smoker -- 0.80 packs/day    Types: Cigarettes  . Smokeless  tobacco: Never Used  . Alcohol Use: No   OB History   Grav Para Term Preterm Abortions TAB SAB Ect Mult Living   2 2 2       2      Review of Systems  Constitutional: Negative for fever, chills, diaphoresis and fatigue.  HENT: Negative for congestion, rhinorrhea and sneezing.   Eyes: Negative.   Respiratory: Negative for cough, chest tightness and shortness of breath.   Cardiovascular: Negative for chest pain and leg swelling.  Gastrointestinal: Negative for nausea, vomiting, abdominal pain, diarrhea and blood in stool.  Genitourinary: Negative for frequency, hematuria, flank pain and difficulty urinating.  Musculoskeletal: Negative for arthralgias and back pain.  Skin: Negative for rash.  Neurological: Positive for numbness (from diabetic neuropathy). Negative for dizziness, speech difficulty, weakness and headaches.      Allergies  Morphine and related; Amoxicillin; Amoxicillin-pot clavulanate; Citalopram hydrobromide; Clonazepam; Cymbalta; Macrobid; Penicillins; Pyridium; Zonegran; Zonisamide; Sertraline hcl; and Wellbutrin  Home Medications   Current Outpatient Rx  Name  Route  Sig  Dispense  Refill  . acetaminophen (TYLENOL) 325 MG tablet   Oral   Take 325 mg by mouth every 6 (six) hours as needed (for headache).         Marland Kitchen alprazolam (XANAX) 2 MG tablet      TAKE 1/2 TO 1 TABLET 3  TIMES A DAY AS NEEDED FOR ANXIETY/SLEEP   90 tablet   1   . bumetanide (BUMEX) 1 MG tablet   Oral   Take 0.5 mg by mouth daily.         . Cholecalciferol (VITAMIN D) 2000 UNITS tablet   Oral   Take 2,000 Units by mouth daily.         Marland Kitchen CINNAMON PO   Oral   Take 2,000 mg by mouth 3 (three) times daily.         . clobetasol cream (TEMOVATE) 0.05 %      Apply at bedtime for itching   45 g   1   . diltiazem (CARDIZEM CD) 120 MG 24 hr capsule   Oral   Take 1 capsule (120 mg total) by mouth daily.   90 capsule   3   . esomeprazole (NEXIUM) 40 MG capsule   Oral   Take  40 mg by mouth daily as needed. For acid reflux.         . fenofibrate 160 MG tablet      TAKE 1 TABLET (160 MG TOTAL)  BY MOUTH DAILY.   90 tablet   3   . folic acid (FOLVITE) 1 MG tablet   Oral   Take 1 tablet (1 mg total) by mouth daily.   90 tablet   1   . glucose blood (ONE TOUCH ULTRA TEST) test strip      Test twice daily as directed   100 each   3   . HYDROcodone-acetaminophen (NORCO/VICODIN) 5-325 MG per tablet   Oral   Take 1-2 tablets by mouth every 4 (four) hours as needed.   60 tablet   0   . lidocaine (LIDODERM) 5 %   Transdermal   Place 1 patch onto the skin daily. Remove & Discard patch within 12 hours or as directed by MD         . lisinopril (PRINIVIL,ZESTRIL) 5 MG tablet   Oral   Take 1 tablet (5 mg total) by mouth daily.   90 tablet   3   . metFORMIN (GLUCOPHAGE-XR) 500 MG 24 hr tablet      TAKE 1 TABLET DAILY WITH LARGEST MEAL   30 tablet   3   . Multiple Vitamin (MULTIVITAMIN) tablet   Oral   Take 1 tablet by mouth daily.           Marland Kitchen nystatin-triamcinolone ointment (MYCOLOG)   Topical   Apply topically 2 (two) times daily.   60 g   0   . ONETOUCH DELICA LANCETS 62I MISC               . OVER THE COUNTER MEDICATION   Oral   Take 1 tablet by mouth daily. citracal +D 630/500         . OVER THE COUNTER MEDICATION   Nasal   Place 1 spray into the nose every 6 (six) hours as needed (for congestion). Phenylephrine 1% nasal spray         . simvastatin (ZOCOR) 20 MG tablet   Oral   Take 20 mg by mouth every evening.         Marland Kitchen tiZANidine (ZANAFLEX) 2 MG tablet   Oral   Take 1 tablet (2 mg total) by mouth every 8 (eight) hours as needed.   45 tablet   0   . warfarin (COUMADIN) 5 MG tablet      TAKE 1  TABLET DAILY EXCEPT 1/2 TABLET ON WEDNESDAYS   30 tablet   1    BP 113/85  Pulse 94  Temp(Src) 98.6 F (37 C) (Oral)  Resp 20  Ht 5\' 3"  (1.6 m)  Wt 180 lb (81.647 kg)  BMI 31.89 kg/m2  SpO2 94% Physical Exam   Constitutional: She is oriented to person, place, and time. She appears well-developed and well-nourished.  HENT:  Head: Normocephalic and atraumatic.  Eyes: Pupils are equal, round, and reactive to light.  Neck: Normal range of motion. Neck supple.  Cardiovascular: Normal rate, regular rhythm and normal heart sounds.   Pulmonary/Chest: Effort normal and breath sounds normal. No respiratory distress. She has no wheezes. She has no rales. She exhibits no tenderness.  Abdominal: Soft. Bowel sounds are normal. There is no tenderness. There is no rebound and no guarding.  Musculoskeletal: Normal range of motion. She exhibits edema (Bilateral lower extremities).  Lymphadenopathy:    She has no cervical adenopathy.  Neurological: She is alert and oriented to person, place, and time.  Skin: Skin is warm and dry. No rash noted.  Psychiatric: She has a normal mood and affect.    ED Course  Procedures (including critical care time) Labs Review Labs Reviewed - No data to display Imaging Review No results found.  EKG Interpretation   None       MDM   Final diagnoses:  Hypertension    Patient's blood pressure is normal in ED. She's completely asymptomatic. Given this I did not feel that any further evaluation is necessary. I encouraged her followup with her primary care physician within the next week for recheck on her blood pressure.    Malvin Johns, MD 10/12/13 [phone]

## 2013-10-13 ENCOUNTER — Other Ambulatory Visit: Payer: Self-pay | Admitting: Family Medicine

## 2013-10-13 NOTE — Telephone Encounter (Signed)
Last ov 09-25-13 Med filled 08-14-13 #90 with 1

## 2013-10-13 NOTE — Telephone Encounter (Signed)
Med filled and faxed.  

## 2013-10-15 ENCOUNTER — Ambulatory Visit (INDEPENDENT_AMBULATORY_CARE_PROVIDER_SITE_OTHER): Payer: Medicare Other | Admitting: General Practice

## 2013-10-15 DIAGNOSIS — I2699 Other pulmonary embolism without acute cor pulmonale: Secondary | ICD-10-CM

## 2013-10-15 DIAGNOSIS — Z5181 Encounter for therapeutic drug level monitoring: Secondary | ICD-10-CM

## 2013-10-15 DIAGNOSIS — Z7901 Long term (current) use of anticoagulants: Secondary | ICD-10-CM

## 2013-10-15 DIAGNOSIS — I82409 Acute embolism and thrombosis of unspecified deep veins of unspecified lower extremity: Secondary | ICD-10-CM

## 2013-10-15 LAB — POCT INR: INR: 3.6

## 2013-10-15 NOTE — Progress Notes (Signed)
Pre-visit discussion using our clinic review tool. No additional management support is needed unless otherwise documented below in the visit note.  

## 2013-10-26 ENCOUNTER — Ambulatory Visit (INDEPENDENT_AMBULATORY_CARE_PROVIDER_SITE_OTHER): Payer: Medicare Other | Admitting: General Practice

## 2013-10-26 ENCOUNTER — Telehealth: Payer: Self-pay | Admitting: Family Medicine

## 2013-10-26 DIAGNOSIS — I2699 Other pulmonary embolism without acute cor pulmonale: Secondary | ICD-10-CM

## 2013-10-26 DIAGNOSIS — I82409 Acute embolism and thrombosis of unspecified deep veins of unspecified lower extremity: Secondary | ICD-10-CM

## 2013-10-26 DIAGNOSIS — Z7901 Long term (current) use of anticoagulants: Secondary | ICD-10-CM

## 2013-10-26 LAB — POCT INR: INR: 2.7

## 2013-10-26 MED ORDER — HYDROCODONE-ACETAMINOPHEN 5-325 MG PO TABS: 1.0000 | ORAL_TABLET | ORAL | Status: DC | PRN

## 2013-10-26 NOTE — Progress Notes (Signed)
Pre visit review using our clinic review tool, if applicable. No additional management support is needed unless otherwise documented below in the visit note. 

## 2013-10-26 NOTE — Telephone Encounter (Signed)
Last Ov 09/25/13 Med filled same day #60 with 0

## 2013-10-26 NOTE — Telephone Encounter (Signed)
Patient's husband called to request rx for hydrocodone. Call [phone] when ready for pick up.

## 2013-10-26 NOTE — Telephone Encounter (Signed)
Ok for #60, no refills 

## 2013-10-26 NOTE — Telephone Encounter (Signed)
Med filled. Pt notified.  

## 2013-11-02 ENCOUNTER — Encounter: Payer: Self-pay | Admitting: Family Medicine

## 2013-11-02 ENCOUNTER — Other Ambulatory Visit: Payer: Self-pay | Admitting: Family Medicine

## 2013-11-02 ENCOUNTER — Ambulatory Visit (INDEPENDENT_AMBULATORY_CARE_PROVIDER_SITE_OTHER): Payer: Medicare Other | Admitting: Family Medicine

## 2013-11-02 VITALS — BP 130/80 | HR 99 | Temp 98.4°F | Resp 17 | Wt 183.5 lb

## 2013-11-02 DIAGNOSIS — E785 Hyperlipidemia, unspecified: Secondary | ICD-10-CM

## 2013-11-02 DIAGNOSIS — R609 Edema, unspecified: Secondary | ICD-10-CM

## 2013-11-02 DIAGNOSIS — E1140 Type 2 diabetes mellitus with diabetic neuropathy, unspecified: Secondary | ICD-10-CM

## 2013-11-02 DIAGNOSIS — M810 Age-related osteoporosis without current pathological fracture: Secondary | ICD-10-CM

## 2013-11-02 DIAGNOSIS — E1142 Type 2 diabetes mellitus with diabetic polyneuropathy: Secondary | ICD-10-CM

## 2013-11-02 DIAGNOSIS — E1149 Type 2 diabetes mellitus with other diabetic neurological complication: Secondary | ICD-10-CM

## 2013-11-02 DIAGNOSIS — K644 Residual hemorrhoidal skin tags: Secondary | ICD-10-CM

## 2013-11-02 DIAGNOSIS — I10 Essential (primary) hypertension: Secondary | ICD-10-CM

## 2013-11-02 LAB — BASIC METABOLIC PANEL
BUN: 20 mg/dL (ref 6–23)
CALCIUM: 10.3 mg/dL (ref 8.4–10.5)
CO2: 29 mEq/L (ref 19–32)
CREATININE: 0.9 mg/dL (ref 0.4–1.2)
Chloride: 105 mEq/L (ref 96–112)
GFR: 62.71 mL/min (ref >60.00)
Glucose, Bld: 96 mg/dL (ref 70–99)
Potassium: 3.6 mEq/L (ref 3.5–5.1)
Sodium: 142 mEq/L (ref 135–145)

## 2013-11-02 LAB — CBC WITH DIFFERENTIAL/PLATELET
Basophils Absolute: 0 10*3/uL (ref 0.0–0.1)
Basophils Relative: 0.5 % (ref 0.0–3.0)
Eosinophils Absolute: 0.1 10*3/uL (ref 0.0–0.7)
Eosinophils Relative: 1.4 % (ref 0.0–5.0)
HEMATOCRIT: 46.5 % — AB (ref 36.0–46.0)
Hemoglobin: 15 g/dL (ref 12.0–15.0)
Lymphocytes Relative: 30.8 % (ref 12.0–46.0)
Lymphs Abs: 2.9 10*3/uL (ref 0.7–4.0)
MCHC: 32.2 g/dL (ref 30.0–36.0)
MCV: 87.1 fl (ref 78.0–100.0)
MONO ABS: 0.7 10*3/uL (ref 0.1–1.0)
MONOS PCT: 6.9 % (ref 3.0–12.0)
Neutro Abs: 5.7 10*3/uL (ref 1.4–7.7)
Neutrophils Relative %: 60.4 % (ref 43.0–77.0)
Platelets: 278 10*3/uL (ref 150.0–400.0)
RBC: 5.34 Mil/uL — ABNORMAL HIGH (ref 3.87–5.11)
RDW: 14.9 % — ABNORMAL HIGH (ref 11.5–14.6)
WBC: 9.4 10*3/uL (ref 4.5–10.5)

## 2013-11-02 LAB — TSH: TSH: 2.77 u[IU]/mL (ref 0.35–5.50)

## 2013-11-02 LAB — LIPID PANEL
CHOL/HDL RATIO: 3
Cholesterol: 149 mg/dL (ref 0–200)
HDL: 46.2 mg/dL (ref >39.00)
LDL CALC: 72 mg/dL (ref 0–99)
TRIGLYCERIDES: 153 mg/dL — AB (ref 0.0–149.0)
VLDL: 30.6 mg/dL (ref 0.0–40.0)

## 2013-11-02 LAB — HEPATIC FUNCTION PANEL
ALT: 16 U/L (ref 0–35)
AST: 22 U/L (ref 0–37)
Albumin: 4 g/dL (ref 3.5–5.2)
Alkaline Phosphatase: 38 U/L — ABNORMAL LOW (ref 39–117)
BILIRUBIN TOTAL: 0.4 mg/dL (ref 0.3–1.2)
Bilirubin, Direct: 0 mg/dL (ref 0.0–0.3)
Total Protein: 7 g/dL (ref 6.0–8.3)

## 2013-11-02 LAB — MICROALBUMIN / CREATININE URINE RATIO
CREATININE, U: 37.4 mg/dL
MICROALB UR: 0.7 mg/dL (ref 0.0–1.9)
MICROALB/CREAT RATIO: 1.9 mg/g (ref 0.0–30.0)

## 2013-11-02 MED ORDER — FUROSEMIDE 40 MG PO TABS: 40.0000 mg | ORAL_TABLET | Freq: Every day | ORAL | Status: DC

## 2013-11-02 MED ORDER — HYDROCORTISONE ACE-PRAMOXINE 2.5-1 % RE CREA: 1.0000 "application " | TOPICAL_CREAM | Freq: Three times a day (TID) | RECTAL | Status: DC

## 2013-11-02 NOTE — Assessment & Plan Note (Signed)
New.  Start Analpram to decrease size and help control itching.  If no improvement, will refer to GI for additional tx options.  Pt expressed understanding and is in agreement w/ plan.

## 2013-11-02 NOTE — Progress Notes (Signed)
Pre visit review using our clinic review tool, if applicable. No additional management support is needed unless otherwise documented below in the visit note. 

## 2013-11-02 NOTE — Assessment & Plan Note (Signed)
Check labs for upcoming CPE 

## 2013-11-02 NOTE — Patient Instructions (Signed)
Follow up as scheduled We'll notify you of your lab results and make any changes if needed Use the Analpram as directed STOP the Bumex, Restart the Lasix daily Call with any questions or concerns Hang in there!

## 2013-11-02 NOTE — Progress Notes (Signed)
   Subjective:    Patient ID: Sarah Mcguire, female    DOB: Jul 26, 1941, 73 y.o.   MRN: [id number]  HPI Rectal bleeding- pt reports hx of hemorrhoid that will periodically bleed.  Currently itching.  Not painful to have BM.  Not straining w/ BM.  No constipation.  Pt reports hemorrhoid has been present for 'years'- 'probably as old as you are'.  Will see blood daily after 'big BM'.  Blood is not present in bowl.  Has been using preparation H OTC w/ occasional improvement.  Husband reports he can visualize the external hemorrhoid.  Edema- pt would like to switch back to Lasix from Bumex.  'it's making her too weak'.  Labs- pt would like to have all labs done today in anticipation of upcoming CPE   Review of Systems For ROS see HPI     Objective:   Physical Exam  Vitals reviewed. Constitutional: She appears well-developed and well-nourished. No distress.  Genitourinary: Rectal exam shows external hemorrhoid (large external skin tag surrounding smaller hemorrhoid at 12 o'clock position). Rectal exam shows no tenderness and anal tone normal.  Musculoskeletal: She exhibits edema (trace edema bilaterally). She exhibits no tenderness.          Assessment & Plan:

## 2013-11-02 NOTE — Assessment & Plan Note (Signed)
Chronic problem for pt.  Diuresed effectively on Bumex but would now like to switch back to Lasix.  Script given.  Check BMP.

## 2013-11-02 NOTE — Telephone Encounter (Signed)
Med filled.  

## 2013-11-03 ENCOUNTER — Telehealth: Payer: Self-pay | Admitting: Family Medicine

## 2013-11-03 LAB — HEMOGLOBIN A1C: Hgb A1c MFr Bld: 7.1 % — ABNORMAL HIGH (ref 4.6–6.5)

## 2013-11-03 NOTE — Telephone Encounter (Signed)
Relevant patient education assigned to patient using Emmi. ° °

## 2013-11-04 ENCOUNTER — Telehealth: Payer: Self-pay

## 2013-11-04 NOTE — Telephone Encounter (Signed)
Relevant patient education assigned to patient using Emmi. ° °

## 2013-11-07 LAB — VITAMIN D 1,25 DIHYDROXY
VITAMIN D3 1, 25 (OH): 57 pg/mL
Vitamin D 1, 25 (OH)2 Total: 57 pg/mL (ref 18–72)
Vitamin D2 1, 25 (OH)2: <8 pg/mL

## 2013-11-09 ENCOUNTER — Telehealth: Payer: Self-pay

## 2013-11-09 NOTE — Telephone Encounter (Signed)
Medication and allergies: Reviewed and updated   90 day supply/mail order: n/a  Local pharmacy: [facility] on [address]   Immunizations due: UTD   A/P:  No changes to personal, family history or past surgical hx  PAP- 06/06/09-negative  CCS- 09/07/08-diverticulosis; repeat in 10 years.  MMG- DUE BD-approx. 3 years ago per patient  Flu- 07/13/13  Tdap- 07/30/08  PNA- 06/03/08  Shingles- 07/27/12   To Discuss with Provider:  Not at this time.

## 2013-11-11 ENCOUNTER — Telehealth: Payer: Self-pay | Admitting: Family Medicine

## 2013-11-11 ENCOUNTER — Encounter: Payer: Self-pay | Admitting: Family Medicine

## 2013-11-11 ENCOUNTER — Ambulatory Visit (INDEPENDENT_AMBULATORY_CARE_PROVIDER_SITE_OTHER): Payer: Medicare Other | Admitting: Family Medicine

## 2013-11-11 VITALS — BP 136/92 | HR 97 | Temp 98.2°F | Resp 16 | Ht 63.25 in | Wt 184.0 lb

## 2013-11-11 DIAGNOSIS — Z0000 Encounter for general adult medical examination without abnormal findings: Secondary | ICD-10-CM

## 2013-11-11 DIAGNOSIS — E1142 Type 2 diabetes mellitus with diabetic polyneuropathy: Secondary | ICD-10-CM

## 2013-11-11 DIAGNOSIS — I10 Essential (primary) hypertension: Secondary | ICD-10-CM

## 2013-11-11 DIAGNOSIS — E785 Hyperlipidemia, unspecified: Secondary | ICD-10-CM

## 2013-11-11 DIAGNOSIS — E1140 Type 2 diabetes mellitus with diabetic neuropathy, unspecified: Secondary | ICD-10-CM

## 2013-11-11 DIAGNOSIS — E1149 Type 2 diabetes mellitus with other diabetic neurological complication: Secondary | ICD-10-CM

## 2013-11-11 NOTE — Patient Instructions (Signed)
Follow up in 4-6 weeks to recheck BP The labs look great!  Keep up the good work! Continue to walk regularly, you're doing really well! Call with any questions or concerns [place]!!

## 2013-11-11 NOTE — Assessment & Plan Note (Addendum)
Chronic problem.  Tolerating statin w/o difficulty.  Reviewed labs.  No med changes

## 2013-11-11 NOTE — Assessment & Plan Note (Signed)
Pt's PE WNL.  Refusing mammo.  UTD on colonoscopy and DEXA.  Check labs.  Anticipatory guidance provided.

## 2013-11-11 NOTE — Assessment & Plan Note (Addendum)
Ongoing problem.  Currently asymptomatic.  UTD on eye exam.  On ACE for renal protection.  Reviewed labs.  No med changes

## 2013-11-11 NOTE — Progress Notes (Signed)
   Subjective:    Patient ID: Sarah Mcguire, female    DOB: Jan 03, 1941, 73 y.o.   MRN: [id number]  HPI Here today for CPE.  Risk Factors: HTN- chronic problem, on Lisinopril, Lasix, Dilt.  Mildly elevated today but pt is very stressed b/c she has f/u eye appt today.  Denies CP, SOB, HAs, edema. DM- chronic problem, on Metformin.  UTD on eye exam.  Denies symptomatic lows. Hyperlipidemia- chronic problem, on Simvastatin and Fenofibrate.  No abd pain, N/V. Physical Activity: limited due to TKR Fall Risk: elevated, walking w/ cane Depression: chronic problem Hearing: normal to conversational tones, mildly decreased to whispered voice ADL's: independent Cognitive: normal linear thought process, memory and attention intact. Home Safety: safe at home, lives w/ husband Height, Weight, BMI, Visual Acuity: see vitals, vision corrected to 20/20 w/ glasses Counseling: UTD on colonoscopy.  No need for pap.  UTD on DEXA, pt refusing mammo Labs Ordered: See A&P Care Plan: See A&P    Review of Systems Patient reports no vision/ hearing changes, adenopathy,fever, weight change,  persistant/recurrent hoarseness , swallowing issues, chest pain, palpitations, edema, persistant/recurrent cough, hemoptysis, dyspnea (rest/exertional/paroxysmal nocturnal), gastrointestinal bleeding (melena, rectal bleeding), abdominal pain, significant heartburn, bowel changes, GU symptoms (dysuria, hematuria, incontinence), Gyn symptoms (abnormal  bleeding, pain),  syncope, focal weakness, memory loss, skin/hair/nail changes, abnormal bruising or bleeding, anxiety, or depression.  + numbness in feet    Objective:   Physical Exam General Appearance:    Alert, cooperative, no distress, appears stated age  Head:    Normocephalic, without obvious abnormality, atraumatic  Eyes:    PERRL, conjunctiva/corneas clear, EOM's intact, fundi    benign, both eyes  Ears:    Normal TM's and external ear canals, both ears  Nose:    Nares normal, septum midline, mucosa normal, no drainage    or sinus tenderness  Throat:   Lips, mucosa, and tongue normal; teeth and gums normal  Neck:   Supple, symmetrical, trachea midline, no adenopathy;    Thyroid: no enlargement/tenderness/nodules  Back:     Symmetric, no curvature, ROM normal, no CVA tenderness  Lungs:     Clear to auscultation bilaterally, respirations unlabored  Chest Wall:    No tenderness or deformity   Heart:    Regular rate and rhythm, S1 and S2 normal, no murmur, rub   or gallop  Breast Exam:    Deferred at pt's request  Abdomen:     Soft, non-tender, bowel sounds active all four quadrants,    no masses, no organomegaly  Genitalia:    Deferred  Rectal:    Extremities:   Extremities normal, atraumatic, no cyanosis or edema  Pulses:   2+ and symmetric all extremities  Skin:   Skin color, texture, turgor normal, no rashes or lesions  Lymph nodes:   Cervical, supraclavicular, and axillary nodes normal  Neurologic:   CNII-XII intact, normal strength, sensation and reflexes    throughout          Assessment & Plan:

## 2013-11-11 NOTE — Assessment & Plan Note (Addendum)
Chronic problem.  Adequate but not ideal control.  Currently asymptomatic.  Labs WNL.  Will follow.

## 2013-11-11 NOTE — Telephone Encounter (Signed)
Relevant patient education assigned to patient using Emmi. ° °

## 2013-11-11 NOTE — Progress Notes (Signed)
Pre visit review using our clinic review tool, if applicable. No additional management support is needed unless otherwise documented below in the visit note. 

## 2013-11-23 ENCOUNTER — Ambulatory Visit (INDEPENDENT_AMBULATORY_CARE_PROVIDER_SITE_OTHER): Payer: Medicare Other | Admitting: General Practice

## 2013-11-23 DIAGNOSIS — I82409 Acute embolism and thrombosis of unspecified deep veins of unspecified lower extremity: Secondary | ICD-10-CM

## 2013-11-23 DIAGNOSIS — Z5181 Encounter for therapeutic drug level monitoring: Secondary | ICD-10-CM

## 2013-11-23 DIAGNOSIS — I2699 Other pulmonary embolism without acute cor pulmonale: Secondary | ICD-10-CM

## 2013-11-23 DIAGNOSIS — Z7901 Long term (current) use of anticoagulants: Secondary | ICD-10-CM

## 2013-11-23 LAB — POCT INR: INR: 3

## 2013-11-23 NOTE — Progress Notes (Signed)
Pre visit review using our clinic review tool, if applicable. No additional management support is needed unless otherwise documented below in the visit note. 

## 2013-12-02 ENCOUNTER — Other Ambulatory Visit: Payer: Self-pay

## 2013-12-02 ENCOUNTER — Telehealth: Payer: Self-pay | Admitting: Family Medicine

## 2013-12-02 MED ORDER — DILTIAZEM HCL ER COATED BEADS 120 MG PO CP24: 120.0000 mg | ORAL_CAPSULE | Freq: Every day | ORAL | Status: DC

## 2013-12-02 MED ORDER — LISINOPRIL 5 MG PO TABS: 5.0000 mg | ORAL_TABLET | Freq: Every day | ORAL | Status: DC

## 2013-12-02 MED ORDER — HYDROCODONE-ACETAMINOPHEN 5-325 MG PO TABS: 1.0000 | ORAL_TABLET | ORAL | Status: DC | PRN

## 2013-12-02 NOTE — Telephone Encounter (Signed)
Prescription printed.  Signed by Dr. Birdie Riddle.  Prescription placed up front and ready for pick up. Patient aware.  No further needs at this time.

## 2013-12-02 NOTE — Telephone Encounter (Signed)
Ok for #60 

## 2013-12-02 NOTE — Telephone Encounter (Signed)
12/02/13  Pt is requesting a refill on rx HYDROcodone-acetaminophen (NORCO/VICODIN) 5-325 MG

## 2013-12-02 NOTE — Telephone Encounter (Signed)
Hydrocodone refill Last OV 11-11-13 Med filled 10-26-13 #60 with 0

## 2013-12-07 ENCOUNTER — Other Ambulatory Visit: Payer: Self-pay | Admitting: Family Medicine

## 2013-12-07 NOTE — Telephone Encounter (Signed)
Med filled.  

## 2013-12-09 ENCOUNTER — Ambulatory Visit (INDEPENDENT_AMBULATORY_CARE_PROVIDER_SITE_OTHER): Payer: Medicare Other | Admitting: Family Medicine

## 2013-12-09 ENCOUNTER — Encounter: Payer: Self-pay | Admitting: Family Medicine

## 2013-12-09 VITALS — BP 128/78 | HR 107 | Temp 98.4°F | Resp 16 | Wt 177.4 lb

## 2013-12-09 DIAGNOSIS — E1142 Type 2 diabetes mellitus with diabetic polyneuropathy: Secondary | ICD-10-CM

## 2013-12-09 DIAGNOSIS — R350 Frequency of micturition: Secondary | ICD-10-CM

## 2013-12-09 DIAGNOSIS — I10 Essential (primary) hypertension: Secondary | ICD-10-CM

## 2013-12-09 DIAGNOSIS — E1149 Type 2 diabetes mellitus with other diabetic neurological complication: Secondary | ICD-10-CM

## 2013-12-09 DIAGNOSIS — E1140 Type 2 diabetes mellitus with diabetic neuropathy, unspecified: Secondary | ICD-10-CM

## 2013-12-09 DIAGNOSIS — N760 Acute vaginitis: Secondary | ICD-10-CM

## 2013-12-09 DIAGNOSIS — N390 Urinary tract infection, site not specified: Secondary | ICD-10-CM

## 2013-12-09 DIAGNOSIS — R319 Hematuria, unspecified: Secondary | ICD-10-CM

## 2013-12-09 DIAGNOSIS — R82998 Other abnormal findings in urine: Secondary | ICD-10-CM

## 2013-12-09 MED ORDER — CEPHALEXIN 500 MG PO CAPS: 500.0000 mg | ORAL_CAPSULE | Freq: Two times a day (BID) | ORAL | Status: AC

## 2013-12-09 MED ORDER — TERCONAZOLE 0.8 % VA CREA: 1.0000 | TOPICAL_CREAM | Freq: Every day | VAGINAL | Status: DC

## 2013-12-09 NOTE — Assessment & Plan Note (Signed)
Recurrent issue for pt.  UA suspicious for infxn.  Start Keflex.  Will await cx results.

## 2013-12-09 NOTE — Patient Instructions (Signed)
Schedule your diabetes follow up in 2-3 months Start the Keflex twice daily for possible UTI Use the Terconazole cream for the irritation/yeast Call with any questions or concerns Hang in there!!

## 2013-12-09 NOTE — Assessment & Plan Note (Signed)
Stressed importance of low carb diet- particularly in setting of steroid injxns.  Encouraged pt not to stress about daily sugars but rather look at the A1C trends to avoid driving herself crazy.  Pt prefers to check multiple times daily.  Offered to increase Metformin to 1000mg  and husband's reply was, 'guess we'll just keep taking the Cinnamon'.  Will continue to follow.

## 2013-12-09 NOTE — Progress Notes (Signed)
   Subjective:    Patient ID: Sarah Mcguire, female    DOB: 15-Aug-1941, 73 y.o.   MRN: [id number]  HPI Vaginitis- recurrent problem, tends to occur in setting of steroid shots.  Husband reports vagina is very red.  Using cream that Dr Phineas Real gave her w/o relief.  Yesterday husband applied Vasoline externally w/ some relief.    HTN- chronic problem, much better today than last visit.  Has lost 7 lbs since 3/15.  Denies CP, SOB, HAs, visual changes, edema.  DM- pt reports sugars 'spiked so high' after shot.  Husband reports that pt has to 'increase her cinnamon tablets' to control sugar.  Wants to know if there's anything else to do.   Review of Systems For ROS see HPI     Objective:   Physical Exam  Vitals reviewed. Constitutional: She is oriented to person, place, and time. She appears well-developed and well-nourished. No distress.  HENT:  Head: Normocephalic and atraumatic.  Cardiovascular: Normal rate, regular rhythm, normal heart sounds and intact distal pulses.   Pulmonary/Chest: Effort normal and breath sounds normal. No respiratory distress. She has no wheezes. She has no rales.  Musculoskeletal: She exhibits edema (trace edema on L, none on R).  Neurological: She is alert and oriented to person, place, and time.  Skin: Skin is warm and dry.  Psychiatric: She has a normal mood and affect. Her behavior is normal.          Assessment & Plan:

## 2013-12-09 NOTE — Progress Notes (Signed)
Pre visit review using our clinic review tool, if applicable. No additional management support is needed unless otherwise documented below in the visit note. 

## 2013-12-09 NOTE — Assessment & Plan Note (Signed)
Chronic problem.  BP much improved today.  Asymptomatic.  No changes.

## 2013-12-09 NOTE — Assessment & Plan Note (Signed)
Recurrent issue for pt- particularly in setting of steroid injxn.  Unable to take oral diflucan due to coumadin.  Start Terconazole cream and monitor for improvement.  If no relief, will need to see GYN.

## 2013-12-11 LAB — URINE CULTURE: Colony Count: 6000

## 2013-12-13 ENCOUNTER — Encounter: Payer: Self-pay | Admitting: Family Medicine

## 2013-12-13 ENCOUNTER — Telehealth: Payer: Self-pay | Admitting: Family Medicine

## 2013-12-14 MED ORDER — TERCONAZOLE 0.8 % VA CREA: 1.0000 | TOPICAL_CREAM | Freq: Every day | VAGINAL | Status: DC

## 2013-12-14 NOTE — Telephone Encounter (Signed)
Med filled.  

## 2013-12-16 ENCOUNTER — Other Ambulatory Visit: Payer: Self-pay | Admitting: Family Medicine

## 2013-12-16 NOTE — Telephone Encounter (Signed)
Med filled.  

## 2013-12-20 ENCOUNTER — Emergency Department (HOSPITAL_COMMUNITY): Payer: Medicare Other

## 2013-12-20 ENCOUNTER — Encounter (HOSPITAL_COMMUNITY): Payer: Self-pay | Admitting: Emergency Medicine

## 2013-12-20 ENCOUNTER — Emergency Department (HOSPITAL_COMMUNITY)
Admission: EM | Admit: 2013-12-20 | Discharge: 2013-12-20 | Disposition: A | Discharge status: Home / Self Care | Payer: Medicare Other | Attending: Emergency Medicine | Admitting: Emergency Medicine

## 2013-12-20 DIAGNOSIS — S99929A Unspecified injury of unspecified foot, initial encounter: Principal | ICD-10-CM

## 2013-12-20 DIAGNOSIS — J4489 Other specified chronic obstructive pulmonary disease: Secondary | ICD-10-CM | POA: Insufficient documentation

## 2013-12-20 DIAGNOSIS — M129 Arthropathy, unspecified: Secondary | ICD-10-CM | POA: Insufficient documentation

## 2013-12-20 DIAGNOSIS — F411 Generalized anxiety disorder: Secondary | ICD-10-CM | POA: Insufficient documentation

## 2013-12-20 DIAGNOSIS — E785 Hyperlipidemia, unspecified: Secondary | ICD-10-CM | POA: Insufficient documentation

## 2013-12-20 DIAGNOSIS — W010XXA Fall on same level from slipping, tripping and stumbling without subsequent striking against object, initial encounter: Secondary | ICD-10-CM | POA: Insufficient documentation

## 2013-12-20 DIAGNOSIS — K219 Gastro-esophageal reflux disease without esophagitis: Secondary | ICD-10-CM | POA: Insufficient documentation

## 2013-12-20 DIAGNOSIS — Z8669 Personal history of other diseases of the nervous system and sense organs: Secondary | ICD-10-CM | POA: Insufficient documentation

## 2013-12-20 DIAGNOSIS — F17200 Nicotine dependence, unspecified, uncomplicated: Secondary | ICD-10-CM | POA: Insufficient documentation

## 2013-12-20 DIAGNOSIS — Z96659 Presence of unspecified artificial knee joint: Secondary | ICD-10-CM | POA: Insufficient documentation

## 2013-12-20 DIAGNOSIS — Y929 Unspecified place or not applicable: Secondary | ICD-10-CM | POA: Insufficient documentation

## 2013-12-20 DIAGNOSIS — D649 Anemia, unspecified: Secondary | ICD-10-CM | POA: Insufficient documentation

## 2013-12-20 DIAGNOSIS — IMO0002 Reserved for concepts with insufficient information to code with codable children: Secondary | ICD-10-CM | POA: Insufficient documentation

## 2013-12-20 DIAGNOSIS — Z86718 Personal history of other venous thrombosis and embolism: Secondary | ICD-10-CM | POA: Insufficient documentation

## 2013-12-20 DIAGNOSIS — I4891 Unspecified atrial fibrillation: Secondary | ICD-10-CM | POA: Insufficient documentation

## 2013-12-20 DIAGNOSIS — Z86711 Personal history of pulmonary embolism: Secondary | ICD-10-CM | POA: Insufficient documentation

## 2013-12-20 DIAGNOSIS — S8990XA Unspecified injury of unspecified lower leg, initial encounter: Secondary | ICD-10-CM | POA: Insufficient documentation

## 2013-12-20 DIAGNOSIS — Y9389 Activity, other specified: Secondary | ICD-10-CM | POA: Insufficient documentation

## 2013-12-20 DIAGNOSIS — M25562 Pain in left knee: Secondary | ICD-10-CM

## 2013-12-20 DIAGNOSIS — Z79899 Other long term (current) drug therapy: Secondary | ICD-10-CM | POA: Insufficient documentation

## 2013-12-20 DIAGNOSIS — W19XXXA Unspecified fall, initial encounter: Secondary | ICD-10-CM

## 2013-12-20 DIAGNOSIS — S99919A Unspecified injury of unspecified ankle, initial encounter: Principal | ICD-10-CM

## 2013-12-20 DIAGNOSIS — J449 Chronic obstructive pulmonary disease, unspecified: Secondary | ICD-10-CM | POA: Insufficient documentation

## 2013-12-20 DIAGNOSIS — E119 Type 2 diabetes mellitus without complications: Secondary | ICD-10-CM | POA: Insufficient documentation

## 2013-12-20 DIAGNOSIS — Z7901 Long term (current) use of anticoagulants: Secondary | ICD-10-CM | POA: Insufficient documentation

## 2013-12-20 DIAGNOSIS — Z880 Allergy status to penicillin: Secondary | ICD-10-CM | POA: Insufficient documentation

## 2013-12-20 DIAGNOSIS — M25561 Pain in right knee: Secondary | ICD-10-CM

## 2013-12-20 DIAGNOSIS — I10 Essential (primary) hypertension: Secondary | ICD-10-CM | POA: Insufficient documentation

## 2013-12-20 MED ORDER — LORAZEPAM 1 MG PO TABS: 2.0000 mg | ORAL_TABLET | Freq: Once | ORAL | Status: DC

## 2013-12-20 NOTE — ED Provider Notes (Signed)
CSN: [id number]     Arrival date & time 12/20/13  1457 History  This chart was scribed for non-physician practitioner Alvina Chou, working with Osvaldo Shipper, MD by Donato Schultz, ED Scribe. This patient was seen in room TR09C/TR09C and the patient's care was started at 3:29 PM.    Chief Complaint  Patient presents with  . Knee Pain    The history is provided by the patient. No language interpreter was used.   HPI Comments: Sarah Mcguire is a 73 y.o. female who presents to the Emergency Department complaining of constant left knee pain that started after the patient slipped and fell on her knees going to the bathroom.  She states that she slipped on her tile floor because her shoes were wet.  She denies remembering what knee she fell on.  The patient denies hitting her head at the time of the fall.  She states that she is currently taking Warfarin.  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    Past Medical History  Diagnosis Date  . Hyperlipidemia   . Hypertension   . Hyperglycemia   . GERD (gastroesophageal reflux disease)   . Anemia   . Leukocytosis   . Diverticulitis   . Neuropathy   . Anxiety     SEVERE  . 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 DVT (deep venous thrombosis)   . Diabetes mellitus     borderline no medications  . Arthritis   . COPD (chronic obstructive pulmonary disease)   . Atrial fibrillation with RVR 11/28/2012   Past Surgical History  Procedure Laterality Date  . Tonsillectomy    . Tympanostomy tube placement      R ear  . Total knee arthroplasty Left 11/24/2012    Procedure: TOTAL KNEE ARTHROPLASTY;  Surgeon: Kerin Salen, MD;  Location: [place];  Service: Orthopedics;  Laterality: Left;   Family History  Problem Relation Age of Onset  . Diabetes Mother   . Hypertension Mother   . Heart disease Father      MI age 35's   . Diabetes Brother   . Hypertension Brother    History  Substance Use Topics  . Smoking status: Current Every Day Smoker -- 0.80 packs/day    Types: Cigarettes  . Smokeless tobacco: Never Used  . Alcohol Use: No   OB History   Grav Para Term Preterm Abortions TAB SAB Ect Mult Living   2 2 2       2      Review of Systems  Musculoskeletal: Positive for arthralgias (left knee).  All other systems reviewed and are negative.     Allergies  Morphine and related; Amoxicillin; Amoxicillin-pot clavulanate; Citalopram hydrobromide; Clonazepam; Cymbalta; Macrobid; Penicillins; Pyridium; Zonegran; Zonisamide; Sertraline hcl; and Wellbutrin  Home Medications   Prior to Admission medications   Medication Sig Start Date End Date Taking? Authorizing Provider  acetaminophen (TYLENOL) 325 MG tablet Take 325 mg by mouth every 6 (six) hours as needed (for headache).    Historical Provider, MD  alprazolam Duanne Moron) 2 MG tablet TAKE 1/2 TO 1 TABLET 3 TIMES A DAY AS NEEDED FOR ANXIETY/SLEEP 12/07/13   Midge Minium, MD  Cholecalciferol (VITAMIN D) 2000 UNITS tablet Take 2,000 Units by mouth daily.    Historical Provider, MD  CINNAMON PO Take 2,000 mg by mouth 3 (three) times  daily.    Historical Provider, MD  clobetasol cream (TEMOVATE) 0.05 % Apply at bedtime for itching 04/14/13   Anastasio Auerbach, MD  diltiazem (CARDIZEM CD) 120 MG 24 hr capsule Take 1 capsule (120 mg total) by mouth daily. 12/02/13   Josue Hector, MD  esomeprazole (NEXIUM) 40 MG capsule Take 40 mg by mouth daily as needed. For acid reflux. 07/09/11   Midge Minium, MD  fenofibrate 160 MG tablet TAKE 1 TABLET (160 MG TOTAL)  BY MOUTH DAILY. 04/14/13   Midge Minium, MD  folic acid (FOLVITE) 1 MG tablet Take 1 tablet (1 mg total) by mouth daily. 09/25/13   Midge Minium, MD  furosemide (LASIX) 40 MG tablet Take 1 tablet (40 mg total) by mouth daily. 11/02/13   Midge Minium, MD  glucose blood (ONE  TOUCH ULTRA TEST) test strip Test twice daily as directed 04/22/13   Midge Minium, MD  HYDROcodone-acetaminophen (NORCO/VICODIN) 5-325 MG per tablet Take 1-2 tablets by mouth every 4 (four) hours as needed. 12/02/13   Midge Minium, MD  hydrocortisone-pramoxine [facility]) 2.5-1 % rectal cream Place 1 application rectally 3 (three) times daily. 11/02/13   Midge Minium, MD  lidocaine (LIDODERM) 5 % Place 1 patch onto the skin daily. Remove & Discard patch within 12 hours or as directed by MD    Historical Provider, MD  lisinopril (PRINIVIL,ZESTRIL) 5 MG tablet Take 1 tablet (5 mg total) by mouth daily. 12/02/13   Josue Hector, MD  metFORMIN (GLUCOPHAGE-XR) 500 MG 24 hr tablet TAKE 1 TABLET DAILY WITH LARGEST MEAL 12/16/13   Midge Minium, MD  Multiple Vitamin (MULTIVITAMIN) tablet Take 1 tablet by mouth daily.      Historical Provider, MD  nystatin-triamcinolone ointment (MYCOLOG) Apply topically 2 (two) times daily. 04/14/13   Anastasio Auerbach, MD  [place] LANCETS 82U MISC  04/27/13   Historical Provider, MD  OVER THE COUNTER MEDICATION Take 1 tablet by mouth daily. citracal +D 630/500    Historical Provider, MD  OVER THE COUNTER MEDICATION Place 1 spray into the nose every 6 (six) hours as needed (for congestion). Phenylephrine 1% nasal spray    Historical Provider, MD  simvastatin (ZOCOR) 20 MG tablet Take 20 mg by mouth every evening.    Historical Provider, MD  terconazole (TERAZOL 3) 0.8 % vaginal cream Place 1 applicator vaginally at bedtime. 12/14/13   Midge Minium, MD  tiZANidine (ZANAFLEX) 2 MG tablet Take 1 tablet (2 mg total) by mouth every 8 (eight) hours as needed. 03/31/13   Midge Minium, MD  warfarin (COUMADIN) 5 MG tablet TAKE 1 TABLET DAILY EXCEPT 1/2 TABLET ON [facility] 11/02/13   Midge Minium, MD   Triage Vitals: BP 136/80  Pulse 82  Temp(Src) 97.7 F (36.5 C) (Oral)  Resp 16  Ht 5\' 3"  (1.6 m)  Wt 177 lb (80.287 kg)  BMI 31.36 kg/m2   SpO2 93%  Physical Exam  Nursing note and vitals reviewed. Constitutional: She is oriented to person, place, and time. She appears well-developed and well-nourished. No distress.  HENT:  Head: Normocephalic and atraumatic.  Eyes: Conjunctivae are normal.  Cardiovascular: Normal rate and regular rhythm.  Exam reveals no gallop and no friction rub.   No murmur heard. Pulmonary/Chest: Effort normal and breath sounds normal. She has no wheezes. She has no rales. She exhibits no tenderness.  Musculoskeletal: Normal range of motion.  Left knee tenderness to palpation. Full  ROM. No obvious deformity bilaterally.   Neurological: She is alert and oriented to person, place, and time. Coordination normal.  Speech is goal-oriented. Moves limbs without ataxia.   Skin: Skin is warm and dry.  Psychiatric: She has a normal mood and affect. Her behavior is normal.    ED Course  Procedures (including critical care time)  DIAGNOSTIC STUDIES: Oxygen Saturation is 93% on room air, low by my interpretation.    COORDINATION OF CARE: 3:31 PM- Discussed obtaining an x-ray of both knees with the patient and the patient agreed to the treatment plan.   Labs Review Labs Reviewed - No data to display  Imaging Review Dg Knee Complete 4 Views Left  12/20/2013   CLINICAL DATA:  Fall.  Left knee injury and pain.  EXAM: LEFT KNEE - COMPLETE 4+ VIEW  COMPARISON:  None.  FINDINGS: Prior total knee arthroplasty seen, with all 3 components in expected position. No evidence of fracture or dislocation. No other bone lesions identified moderate knee joint effusion noted  IMPRESSION: Previous total knee arthroplasty. Moderate knee joint effusion. No acute fracture identified.   Electronically Signed   By: Earle Gell M.D.   On: 12/20/2013 16:49   Dg Knee Complete 4 Views Right  12/20/2013   CLINICAL DATA:  Fall.  Knee injury and pain.  EXAM: RIGHT KNEE - COMPLETE 4+ VIEW  COMPARISON:  None.  FINDINGS: There is no evidence  of fracture, dislocation, or joint effusion. There is no evidence of arthropathy or other focal bone abnormality. Soft tissues are unremarkable.  IMPRESSION: Negative.   Electronically Signed   By: Earle Gell M.D.   On: 12/20/2013 16:48     EKG Interpretation None      MDM   Final diagnoses:  Fall  Bilateral knee pain    No acute changes on xray. Patient will not have a prescription due to pain contract. Patient requested both knees be imaged.   I personally performed the services described in this documentation, which was scribed in my presence. The recorded information has been reviewed and is accurate.    Alvina Chou, PA-C 12/22/13 1607

## 2013-12-20 NOTE — Discharge Instructions (Signed)
Rest, ice and elevate your knees for pain relief. Refer to attached documents for more information. Return to the ED with worsening or concerning symptoms. Follow up with your doctor for further evaluation as needed.

## 2013-12-20 NOTE — ED Notes (Signed)
Pt. Was walking to her bathroom and slipped and fell onto her lt. Knee.  Pt. Has a complete lt. Knee replacement.   Pt. Having pain, no deformity. Mild swelling unable to put weight on the knee. Denies hitting her head.

## 2013-12-21 ENCOUNTER — Ambulatory Visit: Payer: Medicare Other

## 2013-12-22 ENCOUNTER — Ambulatory Visit (INDEPENDENT_AMBULATORY_CARE_PROVIDER_SITE_OTHER): Payer: Medicare Other | Admitting: General Practice

## 2013-12-22 DIAGNOSIS — I2699 Other pulmonary embolism without acute cor pulmonale: Secondary | ICD-10-CM

## 2013-12-22 DIAGNOSIS — I82409 Acute embolism and thrombosis of unspecified deep veins of unspecified lower extremity: Secondary | ICD-10-CM

## 2013-12-22 DIAGNOSIS — Z5181 Encounter for therapeutic drug level monitoring: Secondary | ICD-10-CM

## 2013-12-22 DIAGNOSIS — Z7901 Long term (current) use of anticoagulants: Secondary | ICD-10-CM

## 2013-12-22 LAB — POCT INR: INR: 3.1

## 2013-12-22 NOTE — Progress Notes (Signed)
Pre visit review using our clinic review tool, if applicable. No additional management support is needed unless otherwise documented below in the visit note. 

## 2013-12-23 NOTE — ED Provider Notes (Signed)
Medical screening examination/treatment/procedure(s) were performed by non-physician practitioner and as supervising physician I was immediately available for consultation/collaboration.   EKG Interpretation None        Blanchie Dessert, MD 12/23/13 [phone]

## 2013-12-24 ENCOUNTER — Telehealth: Payer: Self-pay | Admitting: Family Medicine

## 2013-12-24 NOTE — Telephone Encounter (Signed)
Patient's husband called requesting rx for hydrocodone. He is asking to increase quantity from 60 to 90 because the patient fell and hurt her leg. Call [phone] when ready for pick up.

## 2013-12-25 MED ORDER — HYDROCODONE-ACETAMINOPHEN 5-325 MG PO TABS: 1.0000 | ORAL_TABLET | ORAL | Status: DC | PRN

## 2013-12-25 NOTE — Telephone Encounter (Signed)
Ok for #90, no refills 

## 2013-12-25 NOTE — Telephone Encounter (Signed)
Caller name:David Perkinson Relation to pt: husband PCP: [facility] Call back number: [phone] Pharmacy:  Reason for call:   Pt's husband states that pt is worried about having low blood pressure.

## 2013-12-25 NOTE — Telephone Encounter (Signed)
Last OV 12-09-13 Hydrocodone filled 12-02-13 #60 with 0

## 2013-12-25 NOTE — Telephone Encounter (Signed)
Called and spoke with patient, she stated that her blood pressure has been running low. BP 104/86; Last one was 98/81.  Currently taking diltiazem, furosemide, lisinopril, and hydrocodone.  Taking hydrocodone 1/2 tablet every 2 hours.  Denies dizziness.  Hydrocodone not relieving pain.  Dr. Birdie Riddle made aware and advised for patient to continue taking hydrocodone 1/2 tab to 1 tab every 4 hours as needed for pain.  To apply ice or heat to site for pain relief. If pain not relieved, to call orthropedic doctor for further instructions.   Red flags such as blood pressures in low 90's, dizziness, etc were discussed with patient.  They were advised to call if these symptoms present.  They stated understanding.  No further questions or concerns voiced.

## 2013-12-25 NOTE — Telephone Encounter (Signed)
Med filled and pt notified.  

## 2013-12-28 ENCOUNTER — Ambulatory Visit (INDEPENDENT_AMBULATORY_CARE_PROVIDER_SITE_OTHER): Payer: Medicare Other | Admitting: General Practice

## 2013-12-28 ENCOUNTER — Ambulatory Visit: Payer: Medicare Other

## 2013-12-28 DIAGNOSIS — I2699 Other pulmonary embolism without acute cor pulmonale: Secondary | ICD-10-CM

## 2013-12-28 DIAGNOSIS — I82409 Acute embolism and thrombosis of unspecified deep veins of unspecified lower extremity: Secondary | ICD-10-CM

## 2013-12-28 DIAGNOSIS — Z5181 Encounter for therapeutic drug level monitoring: Secondary | ICD-10-CM

## 2013-12-28 DIAGNOSIS — Z7901 Long term (current) use of anticoagulants: Secondary | ICD-10-CM

## 2013-12-28 LAB — POCT INR: INR: 2.4

## 2013-12-28 NOTE — Progress Notes (Signed)
Pre visit review using our clinic review tool, if applicable. No additional management support is needed unless otherwise documented below in the visit note. 

## 2014-01-04 ENCOUNTER — Telehealth: Payer: Self-pay | Admitting: Family Medicine

## 2014-01-04 MED ORDER — SIMVASTATIN 20 MG PO TABS: 20.0000 mg | ORAL_TABLET | Freq: Every evening | ORAL | Status: DC

## 2014-01-04 NOTE — Telephone Encounter (Signed)
Med filled and pt notified.  

## 2014-01-04 NOTE — Telephone Encounter (Signed)
Caller name:Nahomi B Soots Relation to WL:[id number] Call back number: [phone] Pharmacy:[id number] - [place], [place]   Reason for call: to request a refill for simvastatin (ZOCOR) 20 MG tablet and patient husband would like for [place] to call them when we have refilled her medication.

## 2014-01-11 ENCOUNTER — Ambulatory Visit (INDEPENDENT_AMBULATORY_CARE_PROVIDER_SITE_OTHER): Payer: Medicare Other | Admitting: Family Medicine

## 2014-01-11 ENCOUNTER — Encounter: Payer: Self-pay | Admitting: Family Medicine

## 2014-01-11 VITALS — BP 126/76 | HR 94 | Temp 98.2°F | Resp 16

## 2014-01-11 DIAGNOSIS — E1140 Type 2 diabetes mellitus with diabetic neuropathy, unspecified: Secondary | ICD-10-CM

## 2014-01-11 DIAGNOSIS — E1149 Type 2 diabetes mellitus with other diabetic neurological complication: Secondary | ICD-10-CM

## 2014-01-11 DIAGNOSIS — E1142 Type 2 diabetes mellitus with diabetic polyneuropathy: Secondary | ICD-10-CM

## 2014-01-11 DIAGNOSIS — M25569 Pain in unspecified knee: Secondary | ICD-10-CM

## 2014-01-11 MED ORDER — HYDROCODONE-ACETAMINOPHEN 5-325 MG PO TABS: 1.0000 | ORAL_TABLET | ORAL | Status: DC | PRN

## 2014-01-11 NOTE — Progress Notes (Signed)
   Subjective:    Patient ID: Sarah Mcguire, female    DOB: 09/10/1940, 73 y.o.   MRN: [id number]  HPI S/p fall on 4/19- fell on knees bilaterally, s/p L TKR.  Pt reports that she did not damage the implant.  Has had knee drained once.  Ortho is following regularly.  Pt taking hydrocodone for pain management- ortho is not managing.  Pt is asking for script today.  DM- pt asking what time to take meds.  Is currently taking Metformin at dinner which is largest meal but then wondering why her sugar is elevated at night.   Review of Systems For ROS see HPI     Objective:   Physical Exam  Vitals reviewed. Constitutional: She appears well-developed and well-nourished. No distress.  HENT:  Head: Normocephalic and atraumatic.  Cardiovascular: Normal rate, regular rhythm, normal heart sounds and intact distal pulses.   Pulmonary/Chest: Effort normal and breath sounds normal. No respiratory distress. She has no wheezes. She has no rales.  Musculoskeletal: She exhibits edema (L lower leg).  Neurological:  Ambulating w/ walker  Skin: Skin is warm and dry.          Assessment & Plan:

## 2014-01-11 NOTE — Progress Notes (Signed)
Pre visit review using our clinic review tool, if applicable. No additional management support is needed unless otherwise documented below in the visit note. 

## 2014-01-11 NOTE — Patient Instructions (Signed)
Follow up in mid-late June to recheck A1C Move the Metformin to Breakfast Use the hydrocodone as needed for pain ICE the knee Call with any questions or concerns Happy [place] Mother[place] Day!

## 2014-01-11 NOTE — Assessment & Plan Note (Signed)
New.  S/p fall on recent L TKR.  No evidence of hematoma despite pt's ongoing use of blood thinners.  Refill provided on hydrocodone.  Encouraged her to f/u w/ ortho as scheduled.

## 2014-01-11 NOTE — Assessment & Plan Note (Signed)
Too early to check A1C.  Encouraged pt to move Metformin earlier in the day to allow it time to get in her system in time to handle the carb load occuring at dinner.  Will follow.

## 2014-01-18 ENCOUNTER — Other Ambulatory Visit: Payer: Self-pay | Admitting: Family Medicine

## 2014-01-18 NOTE — Telephone Encounter (Signed)
Med filled.  

## 2014-01-28 ENCOUNTER — Ambulatory Visit: Payer: Medicare Other

## 2014-02-01 ENCOUNTER — Telehealth: Payer: Self-pay | Admitting: Family Medicine

## 2014-02-01 ENCOUNTER — Ambulatory Visit: Payer: Medicare Other

## 2014-02-01 ENCOUNTER — Other Ambulatory Visit: Payer: Self-pay | Admitting: Family Medicine

## 2014-02-01 ENCOUNTER — Ambulatory Visit (INDEPENDENT_AMBULATORY_CARE_PROVIDER_SITE_OTHER): Payer: Medicare Other | Admitting: General Practice

## 2014-02-01 DIAGNOSIS — I82409 Acute embolism and thrombosis of unspecified deep veins of unspecified lower extremity: Secondary | ICD-10-CM

## 2014-02-01 DIAGNOSIS — Z5181 Encounter for therapeutic drug level monitoring: Secondary | ICD-10-CM

## 2014-02-01 LAB — POCT INR: INR: 3.5

## 2014-02-01 MED ORDER — HYDROCODONE-ACETAMINOPHEN 5-325 MG PO TABS: 1.0000 | ORAL_TABLET | ORAL | Status: DC | PRN

## 2014-02-01 NOTE — Progress Notes (Signed)
Pre visit review using our clinic review tool, if applicable. No additional management support is needed unless otherwise documented below in the visit note. 

## 2014-02-01 NOTE — Telephone Encounter (Signed)
Med filled and pt husband notified.

## 2014-02-01 NOTE — Telephone Encounter (Signed)
Caller name: Shanon Brow Relation to RK:[id number] Call back number:[phone]  Reason for call:  Pt needs a refill on RX HYDROcodone-acetaminophen (NORCO/VICODIN) 5-325 MG per tablet [[id number]]

## 2014-02-01 NOTE — Telephone Encounter (Signed)
[facility] [place] for #90 but she should not be requiring more than 3x/day.  Goal should be to wean off pain meds

## 2014-02-01 NOTE — Telephone Encounter (Signed)
Med filled.  

## 2014-02-01 NOTE — Telephone Encounter (Signed)
Last OV 01-11-14 Hydrocodone filled same day #90 with 0  Low risk

## 2014-02-18 ENCOUNTER — Ambulatory Visit (INDEPENDENT_AMBULATORY_CARE_PROVIDER_SITE_OTHER): Payer: Medicare Other | Admitting: General Practice

## 2014-02-18 DIAGNOSIS — Z7901 Long term (current) use of anticoagulants: Secondary | ICD-10-CM

## 2014-02-18 DIAGNOSIS — Z5181 Encounter for therapeutic drug level monitoring: Secondary | ICD-10-CM

## 2014-02-18 DIAGNOSIS — I82409 Acute embolism and thrombosis of unspecified deep veins of unspecified lower extremity: Secondary | ICD-10-CM

## 2014-02-18 DIAGNOSIS — I2699 Other pulmonary embolism without acute cor pulmonale: Secondary | ICD-10-CM

## 2014-02-18 LAB — POCT INR: INR: 2.2

## 2014-02-18 NOTE — Progress Notes (Signed)
Pre visit review using our clinic review tool, if applicable. No additional management support is needed unless otherwise documented below in the visit note. 

## 2014-02-22 ENCOUNTER — Ambulatory Visit (INDEPENDENT_AMBULATORY_CARE_PROVIDER_SITE_OTHER): Payer: Medicare Other | Admitting: Family Medicine

## 2014-02-22 ENCOUNTER — Other Ambulatory Visit: Payer: Self-pay | Admitting: General Practice

## 2014-02-22 ENCOUNTER — Encounter: Payer: Self-pay | Admitting: Family Medicine

## 2014-02-22 ENCOUNTER — Ambulatory Visit: Payer: Medicare Other

## 2014-02-22 VITALS — BP 124/78 | HR 98 | Temp 98.3°F | Resp 17 | Wt 180.4 lb

## 2014-02-22 DIAGNOSIS — E1142 Type 2 diabetes mellitus with diabetic polyneuropathy: Secondary | ICD-10-CM

## 2014-02-22 DIAGNOSIS — E1149 Type 2 diabetes mellitus with other diabetic neurological complication: Secondary | ICD-10-CM

## 2014-02-22 DIAGNOSIS — E1140 Type 2 diabetes mellitus with diabetic neuropathy, unspecified: Secondary | ICD-10-CM

## 2014-02-22 LAB — BASIC METABOLIC PANEL
BUN: 24 mg/dL — ABNORMAL HIGH (ref 6–23)
CO2: 32 meq/L (ref 19–32)
Calcium: 10.4 mg/dL (ref 8.4–10.5)
Chloride: 102 mEq/L (ref 96–112)
Creatinine, Ser: 1 mg/dL (ref 0.4–1.2)
GFR: 58.98 mL/min — ABNORMAL LOW (ref >60.00)
Glucose, Bld: 127 mg/dL — ABNORMAL HIGH (ref 70–99)
POTASSIUM: 3.2 meq/L — AB (ref 3.5–5.1)
SODIUM: 142 meq/L (ref 135–145)

## 2014-02-22 LAB — HEMOGLOBIN A1C: Hgb A1c MFr Bld: 7.1 % — ABNORMAL HIGH (ref 4.6–6.5)

## 2014-02-22 MED ORDER — POTASSIUM CHLORIDE CRYS ER 20 MEQ PO TBCR: 20.0000 meq | EXTENDED_RELEASE_TABLET | Freq: Every day | ORAL | Status: DC

## 2014-02-22 MED ORDER — HYDROCODONE-ACETAMINOPHEN 5-325 MG PO TABS: 1.0000 | ORAL_TABLET | ORAL | Status: DC | PRN

## 2014-02-22 NOTE — Patient Instructions (Signed)
Follow up in 3 months to recheck diabetes and cholesterol We'll notify you of your lab results and make any changes if needed Keep up the good work on healthy diet and regular activity Call with any questions or concerns Have a great summer!!!

## 2014-02-22 NOTE — Assessment & Plan Note (Signed)
Pt's home CBGs are well controlled by report.  UTD on eye exam.  Check labs.  Adjust meds prn.  Reviewed supportive care and red flags that should prompt return.  Pt expressed understanding and is in agreement w/ plan.

## 2014-02-22 NOTE — Progress Notes (Signed)
Pre visit review using our clinic review tool, if applicable. No additional management support is needed unless otherwise documented below in the visit note. 

## 2014-02-22 NOTE — Progress Notes (Signed)
   Subjective:    Patient ID: Sarah Mcguire, female    DOB: 02/15/1941, 73 y.o.   MRN: [id number]  HPI DM- chronic problem.  On Metformin.  On Lisinopril for renal protection.  UTD on eye exam.  Home CBGs 127-130.  No CP, SOB, HAs, visual changes, N/V/D.   Review of Systems For ROS see HPI     Objective:   Physical Exam  Vitals reviewed. Constitutional: She is oriented to person, place, and time. She appears well-developed and well-nourished. No distress.  HENT:  Head: Normocephalic and atraumatic.  Eyes: Conjunctivae and EOM are normal. Pupils are equal, round, and reactive to light.  Neck: Normal range of motion. Neck supple. No thyromegaly present.  Cardiovascular: Normal rate, regular rhythm, normal heart sounds and intact distal pulses.   No murmur heard. Pulmonary/Chest: Effort normal and breath sounds normal. No respiratory distress.  Abdominal: Soft. She exhibits no distension. There is no tenderness.  Musculoskeletal: She exhibits edema (1+ pitting edema bilaterally).  Lymphadenopathy:    She has no cervical adenopathy.  Neurological: She is alert and oriented to person, place, and time.  Skin: Skin is warm and dry.  Psychiatric: She has a normal mood and affect. Her behavior is normal.          Assessment & Plan:

## 2014-03-18 ENCOUNTER — Ambulatory Visit (INDEPENDENT_AMBULATORY_CARE_PROVIDER_SITE_OTHER): Payer: Medicare Other | Admitting: General Practice

## 2014-03-18 DIAGNOSIS — Z7901 Long term (current) use of anticoagulants: Secondary | ICD-10-CM

## 2014-03-18 DIAGNOSIS — I2699 Other pulmonary embolism without acute cor pulmonale: Secondary | ICD-10-CM

## 2014-03-18 DIAGNOSIS — I82409 Acute embolism and thrombosis of unspecified deep veins of unspecified lower extremity: Secondary | ICD-10-CM

## 2014-03-18 DIAGNOSIS — Z5181 Encounter for therapeutic drug level monitoring: Secondary | ICD-10-CM

## 2014-03-18 LAB — POCT INR: INR: 2.1

## 2014-03-18 NOTE — Progress Notes (Signed)
Pre visit review using our clinic review tool, if applicable. No additional management support is needed unless otherwise documented below in the visit note. 

## 2014-03-22 ENCOUNTER — Ambulatory Visit (INDEPENDENT_AMBULATORY_CARE_PROVIDER_SITE_OTHER): Payer: Medicare Other | Admitting: Physician Assistant

## 2014-03-22 ENCOUNTER — Other Ambulatory Visit (HOSPITAL_COMMUNITY)
Admission: RE | Admit: 2014-03-22 | Discharge: 2014-03-22 | Disposition: A | Discharge status: Home / Self Care | Payer: Medicare Other | Source: Ambulatory Visit | Attending: Physician Assistant | Admitting: Physician Assistant

## 2014-03-22 ENCOUNTER — Encounter: Payer: Self-pay | Admitting: Physician Assistant

## 2014-03-22 ENCOUNTER — Telehealth: Payer: Self-pay | Admitting: Family Medicine

## 2014-03-22 ENCOUNTER — Telehealth: Payer: Self-pay | Admitting: *Deleted

## 2014-03-22 VITALS — BP 120/83 | HR 95 | Temp 98.0°F | Resp 16 | Ht 63.0 in | Wt 177.5 lb

## 2014-03-22 DIAGNOSIS — N760 Acute vaginitis: Secondary | ICD-10-CM | POA: Insufficient documentation

## 2014-03-22 DIAGNOSIS — R399 Unspecified symptoms and signs involving the genitourinary system: Secondary | ICD-10-CM

## 2014-03-22 DIAGNOSIS — R3989 Other symptoms and signs involving the genitourinary system: Secondary | ICD-10-CM

## 2014-03-22 LAB — POCT URINALYSIS DIPSTICK
Bilirubin, UA: NEGATIVE
Blood, UA: NEGATIVE
GLUCOSE UA: NEGATIVE
Ketones, UA: NEGATIVE
Nitrite, UA: NEGATIVE
PROTEIN UA: NEGATIVE
Spec Grav, UA: 1.01
Urobilinogen, UA: 0.2
pH, UA: 7.5

## 2014-03-22 MED ORDER — TERCONAZOLE 0.8 % VA CREA: 1.0000 | TOPICAL_CREAM | Freq: Every day | VAGINAL | Status: DC

## 2014-03-22 MED ORDER — HYDROCODONE-ACETAMINOPHEN 5-325 MG PO TABS: 1.0000 | ORAL_TABLET | ORAL | Status: DC | PRN

## 2014-03-22 NOTE — Telephone Encounter (Signed)
Caller name:  Georgana Relation to pt:  self Call back number: [phone]  Pharmacy:  Reason for call:   Pt is in office for appt with [facility].  Requesting refill on  HYDROcodone-acetaminophen (NORCO/VICODIN) 5-325 MG per tablet Last filled 02/22/14, #90, no refills Last OV 02/22/2014

## 2014-03-22 NOTE — Telephone Encounter (Signed)
Relevant patient education assigned to patient using Emmi. ° °

## 2014-03-22 NOTE — Patient Instructions (Signed)
Please take medication as directed for vaginitis.  I recommend you take a daily probiotic to help prevent recurrent yeast infections.  I will call you once I have your urine culture results.    If blood glucose is elevated above 250 with steroid injection, it is ok to take an additional metformin.  Monitor glucose daily as before.  If glucose remains elevated, despite taking additional pill, please call the office.

## 2014-03-22 NOTE — Telephone Encounter (Signed)
Med filled and pt given rx.

## 2014-03-22 NOTE — Assessment & Plan Note (Signed)
Will Rx Terazol to apply vaginally.  Encouraged daily probiotic.  Urine sent off for ancillary testing to include candidiasis and bacterial vaginosis.

## 2014-03-22 NOTE — Progress Notes (Signed)
Pre visit review using our clinic review tool, if applicable. No additional management support is needed unless otherwise documented below in the visit note/SLS  

## 2014-03-22 NOTE — Progress Notes (Signed)
Patient presents to clinic today c/o vaginal pruritus and irritation over the past 1-2 weeks.  Denies lesion or discharge.  Has history of recurrent yeast infections.  Also with previous diagnosis of atrophic vaginitis.  Patient denies recent sexual intercourse.  Denies recent antibiotic.  Also endorses a few days of mild dysuria.  Denies urgency, frequency, hematuria or back pain.   Past Medical History  Diagnosis Date  . Hyperlipidemia   . Hypertension   . Hyperglycemia   . GERD (gastroesophageal reflux disease)   . Anemia   . Leukocytosis   . Diverticulitis   . Neuropathy   . Anxiety     SEVERE  . 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 DVT (deep venous thrombosis)   . Diabetes mellitus     borderline no medications  . Arthritis   . COPD (chronic obstructive pulmonary disease)   . Atrial fibrillation with RVR 11/28/2012    Current Outpatient Prescriptions on File Prior to Visit  Medication Sig Dispense Refill  . acetaminophen (TYLENOL) 325 MG tablet Take 325 mg by mouth every 6 (six) hours as needed (for headache).      Marland Kitchen alprazolam (XANAX) 2 MG tablet TAKE 1/2 TO 1 TABLET 3 TIMES A DAY AS NEEDED FOR ANXIETY OR SLEEP  90 tablet  1  . Cholecalciferol (VITAMIN D) 2000 UNITS tablet Take 2,000 Units by mouth daily.      . clobetasol cream (TEMOVATE) 0.05 % Apply at bedtime for itching  45 g  1  . diltiazem (CARDIZEM CD) 120 MG 24 hr capsule Take 1 capsule (120 mg total) by mouth daily.  90 capsule  3  . esomeprazole (NEXIUM) 40 MG capsule Take 40 mg by mouth daily as needed. For acid reflux.      . fenofibrate 160 MG tablet Take 160 mg by mouth daily.      . folic acid (FOLVITE) 1 MG tablet Take 1 tablet (1 mg total) by mouth daily.  90 tablet  1  . furosemide (LASIX) 40 MG tablet Take 1 tablet (40 mg total) by mouth daily.  90 tablet  3  . lidocaine (LIDODERM) 5 % Place 1 patch  onto the skin daily. Remove & Discard patch within 12 hours or as directed by MD      . lisinopril (PRINIVIL,ZESTRIL) 5 MG tablet Take 1 tablet (5 mg total) by mouth daily.  90 tablet  3  . metFORMIN (GLUCOPHAGE-XR) 500 MG 24 hr tablet Take 500 mg by mouth daily with supper.       . Multiple Vitamin (MULTIVITAMIN) tablet Take 1 tablet by mouth daily.        . ONE TOUCH ULTRA TEST test strip       . potassium chloride SA (K-DUR,KLOR-CON) 20 MEQ tablet Take 1 tablet (20 mEq total) by mouth daily.  30 tablet  3  . simvastatin (ZOCOR) 20 MG tablet Take 1 tablet (20 mg total) by mouth every evening.  90 tablet  1  . tiZANidine (ZANAFLEX) 2 MG tablet Take 2 mg by mouth every 6 (six) hours as needed for muscle spasms.      Marland Kitchen warfarin (COUMADIN) 5 MG tablet TAKE 1 TABLET ON TUESDAY, THURSDAY, AND SUNDAY. TAKE 1/2 TABLET BY MOUTH THE REMAINING DAYS.       No current facility-administered medications on file prior to visit.    Allergies  Allergen Reactions  .  Morphine And Related     Usually drops her blood pressure  . Amoxicillin Other (See Comments)    Unknown   . Amoxicillin-Pot Clavulanate Other (See Comments)    Unknown   . Citalopram Hydrobromide Other (See Comments)    Itching   . Clonazepam Itching  . Cymbalta [Duloxetine Hcl] Other (See Comments)    Nausea, headache and diarrhea   . Macrobid [Nitrofurantoin Macrocrystal] Other (See Comments)    Nausea, vomitting  . Penicillins Other (See Comments)    Unknown   . Pyridium [Phenazopyridine Hcl] Other (See Comments)    headache  . Zonegran Other (See Comments)    Unknown   . Zonisamide Other (See Comments)    Unknown   . Sertraline Hcl Anxiety  . Wellbutrin [Bupropion] Itching and Rash    Itching, rash, hyper     Family History  Problem Relation Age of Onset  . Diabetes Mother   . Hypertension Mother   . Heart disease Father     MI age 69's   . Diabetes Brother   . Hypertension Brother     History   Social  History  . Marital Status: Married    Spouse Name: N/A    Number of Children: N/A  . Years of Education: N/A   Social History Main Topics  . Smoking status: Current Every Day Smoker -- 0.80 packs/day    Types: Cigarettes  . Smokeless tobacco: Never Used  . Alcohol Use: No  . Drug Use: No  . Sexual Activity: Yes   Other Topics Concern  . None   Social History Narrative  . None    Review of Systems - See HPI.  All other ROS are negative.  BP 120/83  Pulse 95  Temp(Src) 98 F (36.7 C) (Oral)  Resp 16  Ht 5\' 3"  (1.6 m)  Wt 177 lb 8 oz (80.513 kg)  BMI 31.45 kg/m2  SpO2 93%  Physical Exam  Vitals reviewed. Constitutional: She is oriented to person, place, and time and well-developed, well-nourished, and in no distress.  HENT:  Head: Normocephalic and atraumatic.  Eyes: Conjunctivae are normal.  Cardiovascular: Normal rate, regular rhythm, normal heart sounds and intact distal pulses.   Pulmonary/Chest: Effort normal and breath sounds normal. No respiratory distress. She has no wheezes. She has no rales. She exhibits no tenderness.  Abdominal: Soft. Bowel sounds are normal. She exhibits no distension and no mass. There is no tenderness. There is no rebound and no guarding.  Negative CVA tenderness.  Neurological: She is alert and oriented to person, place, and time.  Skin: Skin is warm and dry. No rash noted.    Recent Results (from the past 2160 hour(s))  POCT INR     Status: None   Collection Time    12/28/13 12:41 PM      Result Value Ref Range   INR 2.4    POCT INR     Status: None   Collection Time    02/01/14  1:47 PM      Result Value Ref Range   INR 3.5    POCT INR     Status: None   Collection Time    02/18/14  4:09 PM      Result Value Ref Range   INR 2.2    HEMOGLOBIN A1C     Status: Abnormal   Collection Time    02/22/14  9:15 AM      Result Value Ref Range   Hemoglobin  A1C 7.1 (*) 4.6 - 6.5 %   Comment: Glycemic Control Guidelines for People  with Diabetes:Non Diabetic:  <6%Goal of Therapy: <7%Additional Action Suggested:  >0%   BASIC METABOLIC PANEL     Status: Abnormal   Collection Time    02/22/14  9:15 AM      Result Value Ref Range   Sodium 142  135 - 145 mEq/L   Potassium 3.2 (*) 3.5 - 5.1 mEq/L   Chloride 102  96 - 112 mEq/L   CO2 32  19 - 32 mEq/L   Glucose, Bld 127 (*) 70 - 99 mg/dL   BUN 24 (*) 6 - 23 mg/dL   Creatinine, Ser 1.0  0.4 - 1.2 mg/dL   Calcium 10.4  8.4 - 10.5 mg/dL   GFR 58.98 (*) >60.00 mL/min  POCT INR     Status: None   Collection Time    03/18/14  2:03 PM      Result Value Ref Range   INR 2.1      Assessment/Plan: Vaginitis and vulvovaginitis Will Rx Terazol to apply vaginally.  Encouraged daily probiotic.  Urine sent off for ancillary testing to include candidiasis and bacterial vaginosis.   UTI symptoms Mild.  Dysuria possibly due to vaginitis.  UA unremarkable except for trace LE.  Will send for culture prior to initiation of antibiotic therapy.

## 2014-03-22 NOTE — Assessment & Plan Note (Signed)
Mild.  Dysuria possibly due to vaginitis.  UA unremarkable except for trace LE.  Will send for culture prior to initiation of antibiotic therapy.

## 2014-03-25 ENCOUNTER — Telehealth: Payer: Self-pay | Admitting: Physician Assistant

## 2014-03-25 DIAGNOSIS — N3000 Acute cystitis without hematuria: Secondary | ICD-10-CM

## 2014-03-25 LAB — CULTURE, URINE COMPREHENSIVE: Colony Count: >=100000

## 2014-03-25 MED ORDER — CEFUROXIME AXETIL 500 MG PO TABS: 500.0000 mg | ORAL_TABLET | Freq: Two times a day (BID) | ORAL | Status: DC

## 2014-03-25 NOTE — Telephone Encounter (Signed)
Urine culture positive for UTI.  Resistant to multiple antibiotics.  She is allergic to most of the good options.  I am sending in a prescription for Ceftin to take twice daily.  It should not interact with her coumadin.

## 2014-03-25 NOTE — Telephone Encounter (Signed)
Patient informed, understood & agreed/SLS  

## 2014-03-25 NOTE — Telephone Encounter (Signed)
Patient husband returned your phone call. Best # [phone]

## 2014-03-25 NOTE — Telephone Encounter (Signed)
LMOM with contact name and number [for return call, if needed] RE: results and further provider instructions, including new Rx to pharmacy/[facility]

## 2014-03-31 ENCOUNTER — Other Ambulatory Visit: Payer: Self-pay | Admitting: Family Medicine

## 2014-03-31 ENCOUNTER — Encounter: Payer: Self-pay | Admitting: Family Medicine

## 2014-03-31 ENCOUNTER — Ambulatory Visit (INDEPENDENT_AMBULATORY_CARE_PROVIDER_SITE_OTHER): Payer: Medicare Other | Admitting: Family Medicine

## 2014-03-31 VITALS — BP 118/78 | HR 91 | Temp 98.1°F | Resp 17 | Wt 177.0 lb

## 2014-03-31 DIAGNOSIS — N760 Acute vaginitis: Secondary | ICD-10-CM

## 2014-03-31 DIAGNOSIS — R300 Dysuria: Secondary | ICD-10-CM

## 2014-03-31 DIAGNOSIS — R82998 Other abnormal findings in urine: Secondary | ICD-10-CM

## 2014-03-31 DIAGNOSIS — R319 Hematuria, unspecified: Secondary | ICD-10-CM

## 2014-03-31 LAB — POCT URINALYSIS DIPSTICK
Bilirubin, UA: NEGATIVE
GLUCOSE UA: NEGATIVE
KETONES UA: NEGATIVE
Nitrite, UA: NEGATIVE
Spec Grav, UA: <=1.005
Urobilinogen, UA: 0.2
pH, UA: 7.5

## 2014-03-31 MED ORDER — TERCONAZOLE 0.8 % VA CREA: 1.0000 | TOPICAL_CREAM | Freq: Every day | VAGINAL | Status: DC

## 2014-03-31 MED ORDER — NYSTATIN-TRIAMCINOLONE 100000-0.1 UNIT/GM-% EX OINT: 1.0000 "application " | TOPICAL_OINTMENT | Freq: Two times a day (BID) | CUTANEOUS | Status: DC

## 2014-03-31 NOTE — Assessment & Plan Note (Signed)
Pt did not complete abx as directed.  Will culture urine to assess for resolution of infxn.

## 2014-03-31 NOTE — Progress Notes (Signed)
Pre visit review using our clinic review tool, if applicable. No additional management support is needed unless otherwise documented below in the visit note. 

## 2014-03-31 NOTE — Assessment & Plan Note (Signed)
Recurrent issue for pt.  Will do another round of terconazole cream for internal sxs and start Mycolog externally twice daily for the fungal dermatitis.  If no improvement after this round of sxs will need to f/u w/ GYN.  Pt expressed understanding and is in agreement w/ plan.

## 2014-03-31 NOTE — Progress Notes (Signed)
   Subjective:    Patient ID: Lurline Del, female    DOB: 13-May-1941, 73 y.o.   MRN: [id number]  HPI Vaginal itching- pt has rash on inner thighs bilaterally.  Stopped Ceftin after 4 days b/c she feared this was causing yeast.  Using Terconazole.  Has used Mytrex nightly previously prescribed by Dr Phineas Real.  Is having both internal and external itching.   Review of Systems For ROS see HPI     Objective:   Physical Exam  Vitals reviewed. Constitutional: She appears well-developed and well-nourished. No distress.  Genitourinary:  Pt declined pelvic exam due to physical discomfort.  Skin: Skin is warm and dry. There is erythema (pt w/ fungal dermatitis in groin creases bilaterally).          Assessment & Plan:

## 2014-03-31 NOTE — Patient Instructions (Signed)
Follow up as scheduled Use the Terazol cream nightly x3 days for possible internal yeast Use the Mycolog cream twice daily for the external rash, redness, itching If no improvement in symptoms after this round of treatment, please call Dr Phineas Real Call with any questions or concerns Hang in there!

## 2014-03-31 NOTE — Telephone Encounter (Signed)
Med filled and faxed.  

## 2014-03-31 NOTE — Telephone Encounter (Signed)
Last OV 02-22-14 Med filled 02-01-14 #90 with 1  Low Risk

## 2014-04-02 LAB — URINE CULTURE

## 2014-04-05 ENCOUNTER — Encounter: Payer: Self-pay | Admitting: Gynecology

## 2014-04-05 ENCOUNTER — Ambulatory Visit (INDEPENDENT_AMBULATORY_CARE_PROVIDER_SITE_OTHER): Payer: Medicare Other | Admitting: Gynecology

## 2014-04-05 DIAGNOSIS — L292 Pruritus vulvae: Secondary | ICD-10-CM

## 2014-04-05 DIAGNOSIS — L293 Anogenital pruritus, unspecified: Secondary | ICD-10-CM

## 2014-04-05 LAB — WET PREP FOR TRICH, YEAST, CLUE
Clue Cells Wet Prep HPF POC: NONE SEEN
Trich, Wet Prep: NONE SEEN

## 2014-04-05 MED ORDER — NYSTATIN-TRIAMCINOLONE 100000-0.1 UNIT/GM-% EX OINT: 1.0000 "application " | TOPICAL_OINTMENT | Freq: Two times a day (BID) | CUTANEOUS | Status: DC

## 2014-04-05 MED ORDER — NYSTATIN 100000 UNIT/GM EX POWD
CUTANEOUS | Status: DC
Start: 2014-04-05 — End: 2014-08-30

## 2014-04-05 NOTE — Addendum Note (Signed)
Addended by: Nelva Nay on: 04/05/2014 03:08 PM   Modules accepted: Orders

## 2014-04-05 NOTE — Patient Instructions (Signed)
Use cream prescribed externally twice daily for your patient. Use the powder prescribed as needed to help prevent recurrence. Followup if symptoms persist, worsen or recur.

## 2014-04-05 NOTE — Progress Notes (Signed)
MARNA WENIGER Feb 17, 1941 [id number]        73 y.o.  [id number] presents with her husband in reference to persistent vulvar itching. Was recently treated for a urinary tract infection with antibiotics and since has had vaginal itching. Has been using Terazol vaginal cream but her external itching has persisted. No urinary symptoms such as frequency dysuria or urgency. She does note that she tends to stay moist in this area.  Past medical history,surgical history, problem list, medications, allergies, family history and social history were all reviewed and documented in the EPIC chart.  Directed ROS with pertinent positives and negatives documented in the history of present illness/assessment and plan.  Exam: Kim assistant General appearance:  Normal External BUS vagina with atrophic changes. Slight white discharge noted. Cervix atrophic upper vagina. Uterus unable to palpate without gross masses or tenderness. Adnexa without gross masses or tenderness.  Assessment/Plan:  73 y.o. [id number] with history and wet prep consistent with yeast vulvovaginitis. We'll treat with Mytrex external cream twice daily as needed and nystatin powder as a preventative to be used when necessary in the groin after the acute symptoms resolve. Followup if symptoms persist, worsen or recur.   Note: This document was prepared with digital dictation and possible smart phrase technology. Any transcriptional errors that result from this process are unintentional.   Anastasio Auerbach MD, 11:02 AM 04/05/2014

## 2014-04-08 ENCOUNTER — Telehealth: Payer: Self-pay | Admitting: *Deleted

## 2014-04-08 NOTE — Telephone Encounter (Signed)
PT HUSBAND CALLED STATING PT SYMPTOMS HAVE NOT IMPROVED SINCE RX GIVEN 04/05/14 OV. HUSBAND CALLED REQUESTING WIFE TO BE SEEN AGAIN PER DR.FONTAINE THE PATIENT HAS NOT GIVEN THE MEDICATION SUFFICIENT TIME TO WORK. THEREFORE AN APPOINTMENT IS NOT RECOMMENDED AT THIS TIME. PER DR.FONTAINE PT WILL CONTINUE BOTH RX PRESCRIBED ON 04/05/14.

## 2014-04-15 ENCOUNTER — Ambulatory Visit: Payer: Medicare Other

## 2014-04-19 ENCOUNTER — Other Ambulatory Visit: Payer: Self-pay | Admitting: Family Medicine

## 2014-04-19 NOTE — Telephone Encounter (Signed)
Med filled.  

## 2014-04-20 ENCOUNTER — Ambulatory Visit (INDEPENDENT_AMBULATORY_CARE_PROVIDER_SITE_OTHER): Payer: Medicare Other | Admitting: Family

## 2014-04-20 DIAGNOSIS — I82409 Acute embolism and thrombosis of unspecified deep veins of unspecified lower extremity: Secondary | ICD-10-CM

## 2014-04-20 DIAGNOSIS — Z5181 Encounter for therapeutic drug level monitoring: Secondary | ICD-10-CM

## 2014-04-20 LAB — POCT INR: INR: 1.8

## 2014-04-20 NOTE — Patient Instructions (Addendum)
Take an extra 1/2 tablet today. Continue to take 1 tablet daily except take 1/2 tablet on M/W/F/Sat.  Re-check in 2 weeks.  Anticoagulation Dose Instructions as of 04/20/2014     Dorene Grebe Tue Wed Thu Fri Sat   New Dose 5 mg 2.5 mg 5 mg 2.5 mg 5 mg 2.5 mg 2.5 mg    Description       Take an extra 1/2 tablet today. Continue to take 1 tablet daily except take 1/2 tablet on M/W/F/Sat.  Re-check in 4 weeks.

## 2014-04-22 ENCOUNTER — Encounter: Payer: Self-pay | Admitting: Medical

## 2014-04-22 ENCOUNTER — Ambulatory Visit (INDEPENDENT_AMBULATORY_CARE_PROVIDER_SITE_OTHER): Payer: Medicare Other | Admitting: Medical

## 2014-04-22 VITALS — BP 135/78 | HR 114 | Temp 98.9°F | Wt 175.0 lb

## 2014-04-22 DIAGNOSIS — R0789 Other chest pain: Secondary | ICD-10-CM | POA: Insufficient documentation

## 2014-04-22 DIAGNOSIS — I10 Essential (primary) hypertension: Secondary | ICD-10-CM

## 2014-04-22 DIAGNOSIS — R079 Chest pain, unspecified: Secondary | ICD-10-CM

## 2014-04-22 MED ORDER — LISINOPRIL 10 MG PO TABS: 10.0000 mg | ORAL_TABLET | Freq: Every day | ORAL | Status: DC

## 2014-04-22 NOTE — Progress Notes (Signed)
Pre visit review using our clinic review tool, if applicable. No additional management support is needed unless otherwise documented below in the visit note. 

## 2014-04-22 NOTE — Assessment & Plan Note (Signed)
  This occurred yesterday for 10-15 minutes. Subsided and has not returned. She had ekg done 04-2013. I wanted to repeat  ekg evaluate rhythm and compare to prior. She refused and wants to come back tomorrow to get when will see Dr. Birdie Riddle?? I advised pt if any recurrent chest pain or if any dyspnea etc then go to emergency department.

## 2014-04-22 NOTE — Assessment & Plan Note (Signed)
I increased her lisinopril to 10 mg q day. Pt will continue her diltiazem and lasix. She declined ekg today despite counseling.

## 2014-04-22 NOTE — Progress Notes (Signed)
   Subjective:    Patient ID: Sarah Mcguire, female    DOB: 1941-01-03, 73 y.o.   MRN: [id number]  HPI  Pt bp was 145/120 yesterday. Diastolic ranged from 149-702. Yesterday when she had high blood pressure she had 10-15 minute of chest pressure then it resolved completley.(She states this occurred when she was notified how high her bp was) Pt has no pain at all since then. No nauseu, no vomiting,no sweating, no arm pain,and no jaw pain. No ha yesterday. Mild faint ha earlier in am but none now. No dizzy, no nausea, no vomiting, no gross motor and sensory function deficits.   Review of Systems  Constitutional: Negative for fever, chills and fatigue.  Respiratory: Negative for cough, chest tightness, shortness of breath and wheezing.   Cardiovascular: Negative for chest pain and palpitations.       10-15 minute pain yesterday but resolved yesterday. None now.  Musculoskeletal: Negative for arthralgias, back pain, gait problem, joint swelling, myalgias, neck pain and neck stiffness.  Neurological: Negative for dizziness, tremors, syncope, facial asymmetry, speech difficulty, weakness, numbness and headaches.  Hematological: Negative for adenopathy. Does not bruise/bleed easily.  Psychiatric/Behavioral: Positive for agitation. Negative for suicidal ideas, behavioral problems, confusion, sleep disturbance, dysphoric mood and decreased concentration. The patient is nervous/anxious.        Easily agitated. When I was checking her bp to confirm level she made comment to the effect I was getting on her nerves.       Objective:   Physical Exam  General Mental Status- Alert. General Appearance- Not in acute distress.  Skin General:-Color-Normal Color. Moisture- Normal Moisture.  Neck Carotid Arteries- normal upstroke and runoff. No JVD. No bruits.  Chest and Lung Exam Auscultation:Rhythm- Regular, rate and rhythm. Murmurs & Other Heart Sounds: Auscultation of the heart reveals- No  murmurs.  Abdomen Inspection:-Inspection Normal. Palpation/Percussion:Note:No mass. Palpation and Percussion of the abdomen reveal- Non Tender.  Peripheral Vascular Lower Extremity: Palpation: Dorsalis pedis pulse- Bilateral- Normal. Edema- Bilateral- No edema. Neg homans signs bilateral.  Neurologic CN III-XII grossly intact, neg romberg,no gross motor or sensory function defecits. No drift on exam. Finger to nose intact. Equal and symmetric 5/5 upper and lower ext strength.        Assessment & Plan:  Pt easily agitated. She states "You are getting to me"Repeatedley. Husband tells me norvasc has been discontinued.

## 2014-04-22 NOTE — Patient Instructions (Signed)
For your high blood pressure I want you to increase your lisinopril to 10 mg q day.(Continue current other  bp medication that you are taking.) I am sending that prescription to your pharmacy. I offered a ekg today since you had chest pain yesterday. You declined so get it done with pcp tomorrow. If your symptoms change or worsen after hours then advised emergency department evaluation.

## 2014-04-23 ENCOUNTER — Ambulatory Visit: Payer: Medicare Other | Admitting: Family Medicine

## 2014-04-23 ENCOUNTER — Encounter: Payer: Self-pay | Admitting: Family Medicine

## 2014-04-23 ENCOUNTER — Ambulatory Visit (INDEPENDENT_AMBULATORY_CARE_PROVIDER_SITE_OTHER): Payer: Medicare Other | Admitting: Family Medicine

## 2014-04-23 ENCOUNTER — Telehealth: Payer: Self-pay | Admitting: Family Medicine

## 2014-04-23 VITALS — BP 134/84 | HR 109 | Temp 98.0°F | Resp 17 | Wt 175.4 lb

## 2014-04-23 DIAGNOSIS — I10 Essential (primary) hypertension: Secondary | ICD-10-CM

## 2014-04-23 NOTE — Telephone Encounter (Signed)
Caller name: Shanon Brow Relation to LY:[id number]   Reason for call:  Pt's husband called in stating that patient was having bp concerns and wanted to see Dr. Birdie Riddle.  Her first available was tomorrow, 04/23/14.  Husband scheduled this appointment.  Pt's husband called back and said that she wanted to be seen sooner, so we offered them an opening that Percell Miller had on his schedule for today, 04/22/14.  Pt took this appointment, but wanted to keep the appointment on 8/21 with Dr. Birdie Riddle too in case anything changed.  They also wanted to keep it just in case they didn't like the PA and could follow directly up with Dr. Birdie Riddle.  Per pt request, i left both appointments standing.

## 2014-04-23 NOTE — Progress Notes (Signed)
Pre visit review using our clinic review tool, if applicable. No additional management support is needed unless otherwise documented below in the visit note. 

## 2014-04-23 NOTE — Telephone Encounter (Signed)
While I appreciate the effort to accommodate the pt's wishes, in the future please give pt the either/or option and not both as this takes an appt away from another pt who needs it.  If it's an emergency, the pt's should be content to see whomever is available, and if it's not an emergency, they can wait until the AM.  Pt was also told this so that in the future they do not schedule 2 appts within 24 hrs (unless instructed to do so by provider).

## 2014-04-23 NOTE — Assessment & Plan Note (Signed)
BP in office yesterday and today were both reassuring.  Unclear if pt's home cuff is accurate.  Encouraged her to bring this to her next appt.  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  Also discussed her refusal to comply w/ recommended tx yesterday and her rudeness to PA.  Pt admitted that she told him he was bothering her but neither she nor her husband feel this is rude.  Discussed that it is, in fact, rude and it is disrespectful.  Pt was told that if there is another incident of rudeness to staff or providers, they will be dismissed from the practice.

## 2014-04-23 NOTE — Progress Notes (Signed)
   Subjective:    Patient ID: Sarah Mcguire, female    DOB: 06/06/1941, 73 y.o.   MRN: [id number]  HPI HTN- pt was here yesterday to see Percell Miller and pt refused to participate in PE, EKG.  Pt made comment, 'you're really getting on my nerves'.  When confronted today about their rudeness, pt and husband reply, 'we weren't rude'.  'we didn't like him'.  Pt's BP normal today.  On Lisinopril, Lasix, Dilt.  Pt had one home reading of 140/120 but otherwise husband reports it has been normal.  Husband reports pt has been having increased anxiety, 'worried that something's going to happen to her'.  Pt states she has a lot of friends 'who are really really sick'.  'we've got a lot of bad news lately'.  No CP, SOB, HAs, visual changes.   Review of Systems For ROS see HPI     Objective:   Physical Exam  Vitals reviewed. Constitutional: She is oriented to person, place, and time. She appears well-developed and well-nourished. No distress.  HENT:  Head: Normocephalic and atraumatic.  Eyes: Conjunctivae and EOM are normal. Pupils are equal, round, and reactive to light.  Neck: Normal range of motion. Neck supple. No thyromegaly present.  Cardiovascular: Normal rate, regular rhythm, normal heart sounds and intact distal pulses.   No murmur heard. Pulmonary/Chest: Effort normal and breath sounds normal. No respiratory distress.  Abdominal: Soft. She exhibits no distension. There is no tenderness.  Musculoskeletal: She exhibits edema (1+ edema bilaterally).  Lymphadenopathy:    She has no cervical adenopathy.  Neurological: She is alert and oriented to person, place, and time.  Skin: Skin is warm and dry.  Psychiatric: She has a normal mood and affect. Her behavior is normal.          Assessment & Plan:

## 2014-04-23 NOTE — Patient Instructions (Signed)
Follow up as scheduled for September Increase the Lisinopril to 10mg  daily Drink plenty of fluids Try and find a stress outlet Find your fun!! Call with any questions or concerns Hang in there!!

## 2014-05-04 ENCOUNTER — Ambulatory Visit (INDEPENDENT_AMBULATORY_CARE_PROVIDER_SITE_OTHER): Payer: Medicare Other | Admitting: Family

## 2014-05-04 DIAGNOSIS — Z5181 Encounter for therapeutic drug level monitoring: Secondary | ICD-10-CM

## 2014-05-04 LAB — POCT INR: INR: 2

## 2014-05-04 NOTE — Patient Instructions (Signed)
Continue to take 1 tablet daily except take 1/2 tablet on M/W/F/Sat.  Re-check in 4 weeks.  Anticoagulation Dose Instructions as of 05/04/2014     Dorene Grebe Tue Wed Thu Fri Sat   New Dose 5 mg 2.5 mg 5 mg 2.5 mg 5 mg 2.5 mg 2.5 mg    Description       Continue to take 1 tablet daily except take 1/2 tablet on M/W/F/Sat.  Re-check in 4 weeks.

## 2014-05-11 ENCOUNTER — Other Ambulatory Visit: Payer: Self-pay | Admitting: Family Medicine

## 2014-05-11 ENCOUNTER — Telehealth: Payer: Self-pay | Admitting: Family Medicine

## 2014-05-11 ENCOUNTER — Encounter: Payer: Self-pay | Admitting: Family Medicine

## 2014-05-11 NOTE — Telephone Encounter (Signed)
Caller name: Shanon Brow Relation to pt: husband Call back number: [phone] Pharmacy:  Reason for call:    Patient husband requesting a new hydrocodone rx

## 2014-05-11 NOTE — Telephone Encounter (Signed)
Med filled.  

## 2014-05-12 MED ORDER — HYDROCODONE-ACETAMINOPHEN 5-325 MG PO TABS: 1.0000 | ORAL_TABLET | ORAL | Status: DC | PRN

## 2014-05-12 NOTE — Telephone Encounter (Signed)
Medication filled.  

## 2014-05-18 ENCOUNTER — Ambulatory Visit: Payer: Medicare Other | Admitting: Family

## 2014-05-25 ENCOUNTER — Other Ambulatory Visit: Payer: Self-pay | Admitting: General Practice

## 2014-05-25 MED ORDER — FOLIC ACID 1 MG PO TABS: 1.0000 mg | ORAL_TABLET | Freq: Every day | ORAL | Status: DC

## 2014-05-31 ENCOUNTER — Ambulatory Visit: Payer: Medicare Other

## 2014-05-31 ENCOUNTER — Other Ambulatory Visit: Payer: Self-pay | Admitting: General Practice

## 2014-05-31 ENCOUNTER — Ambulatory Visit (INDEPENDENT_AMBULATORY_CARE_PROVIDER_SITE_OTHER): Payer: Medicare Other | Admitting: Family Medicine

## 2014-05-31 ENCOUNTER — Encounter: Payer: Self-pay | Admitting: Family Medicine

## 2014-05-31 ENCOUNTER — Other Ambulatory Visit: Payer: Self-pay | Admitting: Family Medicine

## 2014-05-31 VITALS — BP 130/82 | HR 99 | Temp 98.1°F | Resp 16 | Wt 177.5 lb

## 2014-05-31 DIAGNOSIS — E785 Hyperlipidemia, unspecified: Secondary | ICD-10-CM

## 2014-05-31 DIAGNOSIS — E1140 Type 2 diabetes mellitus with diabetic neuropathy, unspecified: Secondary | ICD-10-CM

## 2014-05-31 DIAGNOSIS — Z86718 Personal history of other venous thrombosis and embolism: Secondary | ICD-10-CM

## 2014-05-31 DIAGNOSIS — E1142 Type 2 diabetes mellitus with diabetic polyneuropathy: Secondary | ICD-10-CM

## 2014-05-31 DIAGNOSIS — E1149 Type 2 diabetes mellitus with other diabetic neurological complication: Secondary | ICD-10-CM

## 2014-05-31 DIAGNOSIS — Z86711 Personal history of pulmonary embolism: Secondary | ICD-10-CM

## 2014-05-31 DIAGNOSIS — Z7901 Long term (current) use of anticoagulants: Secondary | ICD-10-CM

## 2014-05-31 DIAGNOSIS — Z23 Encounter for immunization: Secondary | ICD-10-CM

## 2014-05-31 DIAGNOSIS — I10 Essential (primary) hypertension: Secondary | ICD-10-CM

## 2014-05-31 DIAGNOSIS — I4891 Unspecified atrial fibrillation: Secondary | ICD-10-CM

## 2014-05-31 LAB — HEMOGLOBIN A1C: Hgb A1c MFr Bld: 7.5 % — ABNORMAL HIGH (ref 4.6–6.5)

## 2014-05-31 LAB — BASIC METABOLIC PANEL
BUN: 32 mg/dL — ABNORMAL HIGH (ref 6–23)
CALCIUM: 9.8 mg/dL (ref 8.4–10.5)
CO2: 29 mEq/L (ref 19–32)
Chloride: 100 mEq/L (ref 96–112)
Creatinine, Ser: 1 mg/dL (ref 0.4–1.2)
GFR: 58.94 mL/min — AB (ref >60.00)
GLUCOSE: 127 mg/dL — AB (ref 70–99)
POTASSIUM: 4 meq/L (ref 3.5–5.1)
SODIUM: 138 meq/L (ref 135–145)

## 2014-05-31 LAB — CBC WITH DIFFERENTIAL/PLATELET
Basophils Absolute: 0 10*3/uL (ref 0.0–0.1)
Basophils Relative: 0.5 % (ref 0.0–3.0)
EOS ABS: 0.1 10*3/uL (ref 0.0–0.7)
Eosinophils Relative: 1.8 % (ref 0.0–5.0)
HEMATOCRIT: 44.6 % (ref 36.0–46.0)
HEMOGLOBIN: 14.6 g/dL (ref 12.0–15.0)
LYMPHS ABS: 2.7 10*3/uL (ref 0.7–4.0)
Lymphocytes Relative: 35.4 % (ref 12.0–46.0)
MCHC: 32.8 g/dL (ref 30.0–36.0)
MCV: 88.3 fl (ref 78.0–100.0)
MONOS PCT: 9.2 % (ref 3.0–12.0)
Monocytes Absolute: 0.7 10*3/uL (ref 0.1–1.0)
NEUTROS ABS: 4 10*3/uL (ref 1.4–7.7)
Neutrophils Relative %: 53.1 % (ref 43.0–77.0)
Platelets: 230 10*3/uL (ref 150.0–400.0)
RBC: 5.05 Mil/uL (ref 3.87–5.11)
RDW: 16.6 % — ABNORMAL HIGH (ref 11.5–15.5)
WBC: 7.6 10*3/uL (ref 4.0–10.5)

## 2014-05-31 LAB — HEPATIC FUNCTION PANEL
ALBUMIN: 4.2 g/dL (ref 3.5–5.2)
ALK PHOS: 38 U/L — AB (ref 39–117)
ALT: 19 U/L (ref 0–35)
AST: 28 U/L (ref 0–37)
BILIRUBIN TOTAL: 0.4 mg/dL (ref 0.2–1.2)
Bilirubin, Direct: 0 mg/dL (ref 0.0–0.3)
Total Protein: 6.9 g/dL (ref 6.0–8.3)

## 2014-05-31 LAB — LIPID PANEL
CHOL/HDL RATIO: 3
CHOLESTEROL: 144 mg/dL (ref 0–200)
HDL: 42.8 mg/dL (ref >39.00)
LDL Cholesterol: 65 mg/dL (ref 0–99)
NONHDL: 101.2
Triglycerides: 182 mg/dL — ABNORMAL HIGH (ref 0.0–149.0)
VLDL: 36.4 mg/dL (ref 0.0–40.0)

## 2014-05-31 LAB — TSH: TSH: 3.23 u[IU]/mL (ref 0.35–4.50)

## 2014-05-31 LAB — POCT INR: INR: 2

## 2014-05-31 MED ORDER — METFORMIN HCL ER (MOD) 1000 MG PO TB24: 1000.0000 mg | ORAL_TABLET | Freq: Every day | ORAL | Status: DC

## 2014-05-31 MED ORDER — FOLIC ACID 1 MG PO TABS: 1.0000 mg | ORAL_TABLET | Freq: Every day | ORAL | Status: AC

## 2014-05-31 MED ORDER — ALPRAZOLAM 2 MG PO TABS: ORAL_TABLET | ORAL | Status: DC

## 2014-05-31 MED ORDER — LISINOPRIL 10 MG PO TABS: 10.0000 mg | ORAL_TABLET | Freq: Every day | ORAL | Status: DC

## 2014-05-31 NOTE — Assessment & Plan Note (Signed)
Chronic problem.  UTD on eye exam.  Tolerating Metformin.  Pt wants to increase metformin if able in order to decrease the amount of cinnamon capsules she is taking.  Check labs.  Adjust meds prn

## 2014-05-31 NOTE — Progress Notes (Signed)
Pre visit review using our clinic review tool, if applicable. No additional management support is needed unless otherwise documented below in the visit note. 

## 2014-05-31 NOTE — Progress Notes (Signed)
   Subjective:    Patient ID: Sarah Mcguire, female    DOB: [birth year], 73 y.o.   MRN: [id number]  HPI HTN- chronic problem, on Lisinopril, Dilt, Lasix.  No CP, SOB, HAs, visual changes.  Hyperlipidemia- chronic problem, on Simvastatin and fenofibrate.  Denies abd pain, N/V, myalgias  DM- chronic problem, on Metformin.  ACE for renal protection.  UTD on eye exam recent cataract surgery.  Pt reports CBGs are 'crazy'.  CBGs running 90s-130s.  Taking cinnamon regularly.  Husband wants to increase Metformin to decrease the amount of cinnamon she is taking.  Denies symptomatic lows.   Review of Systems For ROS see HPI     Objective:   Physical Exam  Vitals reviewed. Constitutional: She is oriented to person, place, and time. She appears well-developed and well-nourished. No distress.  HENT:  Head: Normocephalic and atraumatic.  Eyes: Conjunctivae and EOM are normal. Pupils are equal, round, and reactive to light.  Neck: Normal range of motion. Neck supple. No thyromegaly present.  Cardiovascular: Normal rate, regular rhythm, normal heart sounds and intact distal pulses.   No murmur heard. Pulmonary/Chest: Effort normal and breath sounds normal. No respiratory distress.  Abdominal: Soft. She exhibits no distension. There is no tenderness.  Musculoskeletal: She exhibits no edema.  Lymphadenopathy:    She has no cervical adenopathy.  Neurological: She is alert and oriented to person, place, and time.  Skin: Skin is warm and dry.  Psychiatric: She has a normal mood and affect. Her behavior is normal.          Assessment & Plan:

## 2014-05-31 NOTE — Assessment & Plan Note (Signed)
Chronic problem.  Tolerating statin w/o difficulty.  Check labs.  Adjust meds prn  

## 2014-05-31 NOTE — Patient Instructions (Signed)
Follow up in 3-4 months to recheck diabetes (this does not have to be fasting) We'll notify you of your lab results and make any changes if needed We'll consider increasing your metformin once we review your kidney function Decrease the Lasix to 1/2 tab daily Call with any questions or concerns Enjoy your vacation!

## 2014-05-31 NOTE — Assessment & Plan Note (Signed)
Chronic problem.  Well controlled today.  Asymptomatic.  Check labs.  No anticipated med changes. 

## 2014-05-31 NOTE — Assessment & Plan Note (Signed)
Pt refusing to go to Coumadin clinic today.  PT/INR done here in office.  Within range.  No changes.

## 2014-06-01 ENCOUNTER — Telehealth: Payer: Self-pay | Admitting: Family Medicine

## 2014-06-01 MED ORDER — METFORMIN HCL ER 500 MG PO TB24: ORAL_TABLET | ORAL | Status: DC

## 2014-06-01 NOTE — Telephone Encounter (Signed)
meds filled

## 2014-06-01 NOTE — Telephone Encounter (Signed)
Caller name:York Ellin Relation to KZ:SWFU Call back number:[phone] Pharmacy:[facility]  Reason for call: pt insurance will not cover the rx metFORMIN (GLUMETZA) 1000 MG (MOD) 24 hr tablet Pharmacy states they will cover metformin but not glumetza, pharmacy would like to know what to do.

## 2014-06-01 NOTE — Telephone Encounter (Signed)
Please call patient regarding this. [phone]

## 2014-06-08 ENCOUNTER — Other Ambulatory Visit: Payer: Self-pay | Admitting: General Practice

## 2014-06-08 MED ORDER — POTASSIUM CHLORIDE CRYS ER 20 MEQ PO TBCR: 20.0000 meq | EXTENDED_RELEASE_TABLET | Freq: Every day | ORAL | Status: DC

## 2014-06-10 ENCOUNTER — Telehealth: Payer: Self-pay | Admitting: Family Medicine

## 2014-06-10 MED ORDER — HYDROCODONE-ACETAMINOPHEN 5-325 MG PO TABS: 1.0000 | ORAL_TABLET | ORAL | Status: DC | PRN

## 2014-06-10 NOTE — Telephone Encounter (Signed)
Med filled, pt notified, placed at front desk.

## 2014-06-10 NOTE — Telephone Encounter (Signed)
Caller name: Shanon Brow  Relation to pt: spouse  Call back number: [phone]   Reason for call:pt requesting a refill HYDROcodone-acetaminophen (NORCO/VICODIN) 5-325 MG per tablet

## 2014-06-10 NOTE — Telephone Encounter (Signed)
Last OV 05-31-14 Hydrocodone filled 05-12-14 #90 with 0

## 2014-06-10 NOTE — Telephone Encounter (Signed)
Ok for 90

## 2014-06-13 ENCOUNTER — Encounter: Payer: Self-pay | Admitting: Family Medicine

## 2014-06-17 ENCOUNTER — Other Ambulatory Visit (INDEPENDENT_AMBULATORY_CARE_PROVIDER_SITE_OTHER): Payer: Medicare Other

## 2014-06-17 DIAGNOSIS — E1140 Type 2 diabetes mellitus with diabetic neuropathy, unspecified: Secondary | ICD-10-CM

## 2014-06-17 LAB — BASIC METABOLIC PANEL
BUN: 26 mg/dL — AB (ref 6–23)
CHLORIDE: 105 meq/L (ref 96–112)
CO2: 29 meq/L (ref 19–32)
CREATININE: 0.9 mg/dL (ref 0.4–1.2)
Calcium: 9.7 mg/dL (ref 8.4–10.5)
GFR: 65.86 mL/min (ref >60.00)
GLUCOSE: 126 mg/dL — AB (ref 70–99)
POTASSIUM: 4.1 meq/L (ref 3.5–5.1)
Sodium: 140 mEq/L (ref 135–145)

## 2014-06-21 ENCOUNTER — Other Ambulatory Visit: Payer: Self-pay | Admitting: General Practice

## 2014-06-21 MED ORDER — SIMVASTATIN 20 MG PO TABS: 20.0000 mg | ORAL_TABLET | Freq: Every evening | ORAL | Status: DC

## 2014-06-24 ENCOUNTER — Other Ambulatory Visit: Payer: Medicare Other

## 2014-06-25 ENCOUNTER — Telehealth: Payer: Self-pay | Admitting: Family Medicine

## 2014-06-25 ENCOUNTER — Ambulatory Visit (INDEPENDENT_AMBULATORY_CARE_PROVIDER_SITE_OTHER): Payer: Medicare Other | Admitting: Family

## 2014-06-25 DIAGNOSIS — Z5181 Encounter for therapeutic drug level monitoring: Secondary | ICD-10-CM

## 2014-06-25 DIAGNOSIS — I82409 Acute embolism and thrombosis of unspecified deep veins of unspecified lower extremity: Secondary | ICD-10-CM

## 2014-06-25 LAB — POCT INR: INR: 1.9

## 2014-06-25 NOTE — Telephone Encounter (Signed)
Pt unable to keep coumadin appointment on Monday and needs to come in today between 2pm-3pm.  Attempted to schedule pt at [facility] but they do not have a coumadin clinic today.  Pt requesting to be worked in this afternoon, please advise

## 2014-06-25 NOTE — Telephone Encounter (Signed)
Pt has been sch today. Pt son is aware will be a wait

## 2014-06-25 NOTE — Telephone Encounter (Signed)
Per Sarah Mcguire, ok to schedule pt but advise that she may have to wait longer than usual due to full schedule

## 2014-06-28 ENCOUNTER — Ambulatory Visit: Payer: Medicare Other

## 2014-06-28 ENCOUNTER — Ambulatory Visit: Payer: Medicare Other | Admitting: Family

## 2014-07-04 LAB — HM DIABETES EYE EXAM

## 2014-07-05 ENCOUNTER — Encounter: Payer: Self-pay | Admitting: Family Medicine

## 2014-07-07 ENCOUNTER — Ambulatory Visit (INDEPENDENT_AMBULATORY_CARE_PROVIDER_SITE_OTHER): Payer: Medicare Other | Admitting: Family

## 2014-07-07 DIAGNOSIS — Z5181 Encounter for therapeutic drug level monitoring: Secondary | ICD-10-CM

## 2014-07-07 DIAGNOSIS — I82409 Acute embolism and thrombosis of unspecified deep veins of unspecified lower extremity: Secondary | ICD-10-CM

## 2014-07-07 LAB — POCT INR: INR: 2.7

## 2014-07-07 NOTE — Patient Instructions (Signed)
Continue to take 1 tablet daily except take 1/2 tablet on M/W/F/Sat.  Re-check in 4 weeks.  Anticoagulation Dose Instructions as of 07/07/2014      Dorene Grebe Tue Wed Thu Fri Sat   New Dose 5 mg 2.5 mg 5 mg 2.5 mg 5 mg 2.5 mg 2.5 mg    Description        Continue to take 1 tablet daily except take 1/2 tablet on M/W/F/Sat.  Re-check in 4 weeks.

## 2014-07-12 ENCOUNTER — Telehealth: Payer: Self-pay | Admitting: Family Medicine

## 2014-07-12 ENCOUNTER — Other Ambulatory Visit: Payer: Self-pay | Admitting: General Practice

## 2014-07-12 MED ORDER — HYDROCODONE-ACETAMINOPHEN 5-325 MG PO TABS: 1.0000 | ORAL_TABLET | ORAL | Status: DC | PRN

## 2014-07-12 MED ORDER — LISINOPRIL 10 MG PO TABS: 10.0000 mg | ORAL_TABLET | Freq: Every day | ORAL | Status: DC

## 2014-07-12 NOTE — Telephone Encounter (Signed)
Med filled per verbal from provider. Pt spouse notified available for pick up.

## 2014-07-12 NOTE — Telephone Encounter (Signed)
Pt is needing new rx HYDROcodone-acetaminophen (NORCO/VICODIN) 5-325 MG per tablet °Please call when available for pick up °

## 2014-07-26 ENCOUNTER — Encounter: Payer: Self-pay | Admitting: Family Medicine

## 2014-07-26 ENCOUNTER — Ambulatory Visit (INDEPENDENT_AMBULATORY_CARE_PROVIDER_SITE_OTHER): Payer: Medicare Other | Admitting: Family Medicine

## 2014-07-26 VITALS — BP 124/80 | HR 98 | Temp 97.9°F | Resp 16 | Wt 176.5 lb

## 2014-07-26 DIAGNOSIS — M25551 Pain in right hip: Secondary | ICD-10-CM

## 2014-07-26 MED ORDER — ALPRAZOLAM 2 MG PO TABS: ORAL_TABLET | ORAL | Status: DC

## 2014-07-26 NOTE — Progress Notes (Signed)
   Subjective:    Patient ID: Sarah Mcguire, female    DOB: Nov 06, 1940, 73 y.o.   MRN: [id number]  HPI R hip pain- pt had injxn on 10/19, 2nd injxn 11/2.  At that point the decision was made that the injxns were not helpful.  Determination was that pt's muscles were too tight.  Pt now in PT and having 'dry needling'.  Pt reports pain is 'a little bit better'.  Pt has been seeing Dr Jacelyn Grip and having PT at [facility].  Pt is on coumadin and is worried about bleeding.  No reports or bleeding/bruising.   Review of Systems For ROS see HPI     Objective:   Physical Exam  Constitutional: She is oriented to person, place, and time. She appears well-developed and well-nourished. No distress.  HENT:  Head: Normocephalic and atraumatic.  Neck: Normal range of motion. Neck supple. No thyromegaly present.  Cardiovascular: Normal rate, regular rhythm, normal heart sounds and intact distal pulses.   Pulmonary/Chest: Effort normal and breath sounds normal. No respiratory distress. She has no wheezes. She has no rales.  Musculoskeletal: She exhibits edema (trace to 1+ LE edema bilaterally).  Neurological: She is alert and oriented to person, place, and time.  Skin: Skin is warm and dry.  Psychiatric:  anxious  Vitals reviewed.         Assessment & Plan:

## 2014-07-26 NOTE — Progress Notes (Signed)
Pre visit review using our clinic review tool, if applicable. No additional management support is needed unless otherwise documented below in the visit note. 

## 2014-07-26 NOTE — Patient Instructions (Signed)
Follow up as scheduled on 12/28 Everything looks good!  Don't stress! Continue w/ physical therapy and use hydrocodone as needed Call with any questions or concerns Happy Holidays!!!

## 2014-08-01 DIAGNOSIS — M25551 Pain in right hip: Secondary | ICD-10-CM | POA: Insufficient documentation

## 2014-08-01 NOTE — Assessment & Plan Note (Signed)
New to provider, ongoing for pt.  Following w/ ortho and in PT.  Encouraged her to continue to follow treatment plan as outlined by her providers.  No med changes at this time.  Pt has pain meds available to use prn.  Will follow.

## 2014-08-02 ENCOUNTER — Ambulatory Visit (INDEPENDENT_AMBULATORY_CARE_PROVIDER_SITE_OTHER): Payer: Medicare Other | Admitting: Family

## 2014-08-02 DIAGNOSIS — I82409 Acute embolism and thrombosis of unspecified deep veins of unspecified lower extremity: Secondary | ICD-10-CM

## 2014-08-02 DIAGNOSIS — Z5181 Encounter for therapeutic drug level monitoring: Secondary | ICD-10-CM

## 2014-08-02 LAB — POCT INR: INR: 1.6

## 2014-08-02 NOTE — Patient Instructions (Signed)
Take an extra 1/2 tab today and tomorrow. Then coninue to take 1 tablet daily except take 1/2 tablet on M/W/F/Sat.  Re-check in 2 weeks.  Anticoagulation Dose Instructions as of 08/02/2014      Dorene Grebe Tue Wed Thu Fri Sat   New Dose 5 mg 2.5 mg 5 mg 2.5 mg 5 mg 2.5 mg 2.5 mg    Description        Take an extra 1/2 tab today and tomorrow. Then coninue to take 1 tablet daily except take 1/2 tablet on M/W/F/Sat.  Re-check in 2 weeks.

## 2014-08-04 ENCOUNTER — Ambulatory Visit: Payer: Medicare Other | Admitting: Family

## 2014-08-13 ENCOUNTER — Telehealth: Payer: Self-pay

## 2014-08-13 MED ORDER — HYDROCODONE-ACETAMINOPHEN 5-325 MG PO TABS: 1.0000 | ORAL_TABLET | ORAL | Status: DC | PRN

## 2014-08-13 NOTE — Telephone Encounter (Signed)
Sarah Mcguire spouse [phone]  Shanon Brow called to say that his wife needs a refill on her HYDROcodone-acetaminophen (NORCO/VICODIN) 5-325 MG per tablet, call him when ready for pick up

## 2014-08-13 NOTE — Telephone Encounter (Signed)
Med filled. Pt notified.  

## 2014-08-13 NOTE — Telephone Encounter (Signed)
Last OV 07-26-14 Hydrocodone last filled 07-12-14 #90 with 0

## 2014-08-13 NOTE — Telephone Encounter (Signed)
Ok for 90

## 2014-08-16 ENCOUNTER — Ambulatory Visit (INDEPENDENT_AMBULATORY_CARE_PROVIDER_SITE_OTHER): Payer: Medicare Other | Admitting: Family

## 2014-08-16 DIAGNOSIS — I82409 Acute embolism and thrombosis of unspecified deep veins of unspecified lower extremity: Secondary | ICD-10-CM

## 2014-08-16 DIAGNOSIS — Z5181 Encounter for therapeutic drug level monitoring: Secondary | ICD-10-CM

## 2014-08-16 LAB — POCT INR: INR: 1.8

## 2014-08-16 NOTE — Patient Instructions (Signed)
Today only, take 1 whole tab. Then increase Coumadin to take 1 tablet daily except take 1/2 tablet on M/W/F.  Re-check in 2 weeks.  Anticoagulation Dose Instructions as of 08/16/2014      Dorene Grebe Tue Wed Thu Fri Sat   New Dose 5 mg 2.5 mg 5 mg 2.5 mg 5 mg 2.5 mg 5 mg    Description        Today only, take 1 whole tab. Then increase Coumadin to take 1 tablet daily except take 1/2 tablet on M/W/F.  Re-check in 2 weeks.

## 2014-08-30 ENCOUNTER — Encounter: Payer: Self-pay | Admitting: Family Medicine

## 2014-08-30 ENCOUNTER — Ambulatory Visit (INDEPENDENT_AMBULATORY_CARE_PROVIDER_SITE_OTHER): Payer: Medicare Other | Admitting: Family Medicine

## 2014-08-30 ENCOUNTER — Ambulatory Visit (INDEPENDENT_AMBULATORY_CARE_PROVIDER_SITE_OTHER): Payer: Medicare Other | Admitting: Family

## 2014-08-30 VITALS — BP 124/94 | HR 97 | Temp 98.2°F | Resp 18 | Ht 63.0 in | Wt 180.4 lb

## 2014-08-30 DIAGNOSIS — I82409 Acute embolism and thrombosis of unspecified deep veins of unspecified lower extremity: Secondary | ICD-10-CM

## 2014-08-30 DIAGNOSIS — R0789 Other chest pain: Secondary | ICD-10-CM

## 2014-08-30 DIAGNOSIS — I4892 Unspecified atrial flutter: Secondary | ICD-10-CM

## 2014-08-30 DIAGNOSIS — R079 Chest pain, unspecified: Secondary | ICD-10-CM

## 2014-08-30 DIAGNOSIS — Z5181 Encounter for therapeutic drug level monitoring: Secondary | ICD-10-CM

## 2014-08-30 DIAGNOSIS — E1140 Type 2 diabetes mellitus with diabetic neuropathy, unspecified: Secondary | ICD-10-CM

## 2014-08-30 DIAGNOSIS — R399 Unspecified symptoms and signs involving the genitourinary system: Secondary | ICD-10-CM

## 2014-08-30 DIAGNOSIS — I10 Essential (primary) hypertension: Secondary | ICD-10-CM

## 2014-08-30 LAB — CBC WITH DIFFERENTIAL/PLATELET
BASOS PCT: 0.4 % (ref 0.0–3.0)
Basophils Absolute: 0 10*3/uL (ref 0.0–0.1)
EOS ABS: 0.1 10*3/uL (ref 0.0–0.7)
Eosinophils Relative: 1.4 % (ref 0.0–5.0)
HCT: 46.1 % — ABNORMAL HIGH (ref 36.0–46.0)
Hemoglobin: 14.7 g/dL (ref 12.0–15.0)
LYMPHS PCT: 30.8 % (ref 12.0–46.0)
Lymphs Abs: 2.3 10*3/uL (ref 0.7–4.0)
MCHC: 31.8 g/dL (ref 30.0–36.0)
MCV: 90 fl (ref 78.0–100.0)
Monocytes Absolute: 0.5 10*3/uL (ref 0.1–1.0)
Monocytes Relative: 6.7 % (ref 3.0–12.0)
NEUTROS PCT: 60.7 % (ref 43.0–77.0)
Neutro Abs: 4.6 10*3/uL (ref 1.4–7.7)
PLATELETS: 221 10*3/uL (ref 150.0–400.0)
RBC: 5.13 Mil/uL — AB (ref 3.87–5.11)
RDW: 15.2 % (ref 11.5–15.5)
WBC: 7.6 10*3/uL (ref 4.0–10.5)

## 2014-08-30 LAB — POCT URINALYSIS DIPSTICK
Bilirubin, UA: NEGATIVE
Blood, UA: NEGATIVE
Glucose, UA: NEGATIVE
Ketones, UA: NEGATIVE
Leukocytes, UA: NEGATIVE
Nitrite, UA: NEGATIVE
PH UA: 7.5
PROTEIN UA: NEGATIVE
Spec Grav, UA: 1.02
UROBILINOGEN UA: 0.2

## 2014-08-30 LAB — BASIC METABOLIC PANEL
BUN: 24 mg/dL — AB (ref 6–23)
CHLORIDE: 105 meq/L (ref 96–112)
CO2: 32 mEq/L (ref 19–32)
Calcium: 10.2 mg/dL (ref 8.4–10.5)
Creatinine, Ser: 0.9 mg/dL (ref 0.4–1.2)
GFR: 64.16 mL/min (ref >60.00)
Glucose, Bld: 108 mg/dL — ABNORMAL HIGH (ref 70–99)
Potassium: 4.1 mEq/L (ref 3.5–5.1)
SODIUM: 143 meq/L (ref 135–145)

## 2014-08-30 LAB — POCT INR: INR: 2.8

## 2014-08-30 LAB — HEMOGLOBIN A1C: HEMOGLOBIN A1C: 7.1 % — AB (ref 4.6–6.5)

## 2014-08-30 MED ORDER — NYSTATIN 100000 UNIT/GM EX POWD: CUTANEOUS | Status: DC

## 2014-08-30 MED ORDER — TERCONAZOLE 0.4 % VA CREA: 1.0000 | TOPICAL_CREAM | Freq: Every day | VAGINAL | Status: DC

## 2014-08-30 NOTE — Progress Notes (Signed)
   Subjective:    Patient ID: Sarah Mcguire, female    DOB: 1940/09/07, 73 y.o.   MRN: [id number]  HPI DM- chronic problem, on Metformin and ACE for renal protection.  Pt has not been checking sugars regularly.  Brief, intermittent, self limiting CP.  No SOB.  Intermittent HAs since cataract surgery (UTD on eye exam).  Chronic neuropathy.  HTN- chronic problem, elevated today despite Lisinopril, Dilt.  Reports she was very nervous to tell me about the chest pain.  Dysuria- recurrent problem.  sxs started ~2 weeks ago.  Pt feels sxs are more vaginal irritation than UTI.   Review of Systems For ROS see HPI     Objective:   Physical Exam  Constitutional: She is oriented to person, place, and time. She appears well-developed and well-nourished. No distress.  HENT:  Head: Normocephalic and atraumatic.  Eyes: Conjunctivae and EOM are normal. Pupils are equal, round, and reactive to light.  Neck: Normal range of motion. Neck supple. No thyromegaly present.  Cardiovascular: Normal rate, regular rhythm, normal heart sounds and intact distal pulses.   No murmur heard. Pulmonary/Chest: Effort normal and breath sounds normal. No respiratory distress.  Abdominal: Soft. She exhibits no distension. There is no tenderness.  Musculoskeletal: She exhibits edema. Tenderness: trace edema bilaterally.  Lymphadenopathy:    She has no cervical adenopathy.  Neurological: She is alert and oriented to person, place, and time.  Skin: Skin is warm and dry.  Psychiatric: She has a normal mood and affect. Her behavior is normal.  Vitals reviewed.         Assessment & Plan:

## 2014-08-30 NOTE — Patient Instructions (Signed)
Follow up in 3 months to recheck Diabetes We'll notify you of your lab results and make any changes if needed Use the Terconazole cream vaginally and externally on the irritated areas We'll call notify you of your cardiology appt Call with any questions or concerns Happy New Year!!

## 2014-08-30 NOTE — Patient Instructions (Signed)
Continue 1 tablet daily except take 1/2 tablet on M/W/F.  Re-check in 3 weeks.  Anticoagulation Dose Instructions as of 08/30/2014      Dorene Grebe Tue Wed Thu Fri Sat   New Dose 5 mg 2.5 mg 5 mg 2.5 mg 5 mg 2.5 mg 5 mg    Description        Continue 1 tablet daily except take 1/2 tablet on M/W/F.  Re-check in 3 weeks.

## 2014-08-30 NOTE — Progress Notes (Signed)
Pre visit review using our clinic review tool, if applicable. No additional management support is needed unless otherwise documented below in the visit note/SLS  

## 2014-09-01 LAB — CULTURE, URINE COMPREHENSIVE
COLONY COUNT: NO GROWTH
Organism ID, Bacteria: NO GROWTH

## 2014-09-02 ENCOUNTER — Ambulatory Visit: Payer: Medicare Other | Admitting: Family Medicine

## 2014-09-05 NOTE — Assessment & Plan Note (Signed)
New on EKG today.  Pt has hx of afib w/ RVR post-op but has not had difficulty since.  Pt is rate controlled today on dilt and already on anticoagulation due to hx of DVT.  Called and discussed w/ cards who recommended appt but not urgently as she is already on dilt and coumadin.  Referral entered.  Dx discussed w/ pt and husband.  Questions answered.  Reviewed supportive care and red flags that should prompt return.  Pt expressed understanding and is in agreement w/ plan.

## 2014-09-05 NOTE — Assessment & Plan Note (Signed)
Recurrent issue for pt.  No evidence of infxn on UA today.  Will hold off on abx until culture results available.  Pt has hx of vaginitis causing her urinary discomfort.  Refill provided on antifungal medications.

## 2014-09-05 NOTE — Assessment & Plan Note (Signed)
New.  Unclear if this is anxiety related (as it has been previously) or if pt is having symptomatic Afib/Aflutter.  Pt to see cardiology for complete evaluation.  Pt expressed understanding and is in agreement w/ plan.

## 2014-09-05 NOTE — Assessment & Plan Note (Signed)
Chronic problem.  Tolerating metformin w/o difficulty.  UTD on eye exam.  On ACE for renal protection.  Check labs.  Adjust meds prn

## 2014-09-05 NOTE — Assessment & Plan Note (Signed)
Chronic problem.  BP mildly elevated today but pt is anxious about recent episodes of CP.  Currently asymptomatic.  Check labs.  No anticipated med changes.  Will follow.

## 2014-09-06 DIAGNOSIS — H524 Presbyopia: Secondary | ICD-10-CM | POA: Diagnosis not present

## 2014-09-06 DIAGNOSIS — Z961 Presence of intraocular lens: Secondary | ICD-10-CM | POA: Diagnosis not present

## 2014-09-09 ENCOUNTER — Telehealth: Payer: Self-pay | Admitting: Family Medicine

## 2014-09-09 MED ORDER — HYDROCODONE-ACETAMINOPHEN 5-325 MG PO TABS: 1.0000 | ORAL_TABLET | ORAL | Status: DC | PRN

## 2014-09-09 NOTE — Telephone Encounter (Signed)
Caller name:david Chilcott Relation to YD:XAJOIN Call back [place]:  Reason for call: pt is needing rx HYDROcodone-acetaminophen (NORCO/VICODIN) 5-325 MG per tablet please call when available for pick up

## 2014-09-09 NOTE — Telephone Encounter (Signed)
Med filled, will notify pt that it is at the front desk for pick up.

## 2014-09-09 NOTE — Telephone Encounter (Signed)
Ok for 90

## 2014-09-09 NOTE — Telephone Encounter (Signed)
Last OV 08-30-14 Hydrocodone last filled 08/13/14 #90 with 0

## 2014-09-20 ENCOUNTER — Other Ambulatory Visit: Payer: Self-pay | Admitting: Family Medicine

## 2014-09-20 ENCOUNTER — Ambulatory Visit (INDEPENDENT_AMBULATORY_CARE_PROVIDER_SITE_OTHER): Payer: Medicare Other | Admitting: Family

## 2014-09-20 ENCOUNTER — Encounter: Payer: Self-pay | Admitting: Family Medicine

## 2014-09-20 DIAGNOSIS — Z5181 Encounter for therapeutic drug level monitoring: Secondary | ICD-10-CM | POA: Diagnosis not present

## 2014-09-20 DIAGNOSIS — I82409 Acute embolism and thrombosis of unspecified deep veins of unspecified lower extremity: Secondary | ICD-10-CM | POA: Diagnosis not present

## 2014-09-20 LAB — POCT INR: INR: 2.7

## 2014-09-20 NOTE — Telephone Encounter (Signed)
Med filled.  

## 2014-09-20 NOTE — Patient Instructions (Signed)
Continue 1 tablet daily except take 1/2 tablet on M/W/F.  Re-check in 4 weeks.  Anticoagulation Dose Instructions as of 09/20/2014      Dorene Grebe Tue Wed Thu Fri Sat   New Dose 5 mg 2.5 mg 5 mg 2.5 mg 5 mg 2.5 mg 5 mg    Description        Continue 1 tablet daily except take 1/2 tablet on M/W/F.  Re-check in 4 weeks.

## 2014-09-21 ENCOUNTER — Encounter: Payer: Self-pay | Admitting: Family Medicine

## 2014-09-21 ENCOUNTER — Ambulatory Visit (INDEPENDENT_AMBULATORY_CARE_PROVIDER_SITE_OTHER): Payer: Medicare Other | Admitting: Family Medicine

## 2014-09-21 VITALS — BP 128/84 | HR 82 | Temp 97.9°F | Resp 16 | Wt 182.2 lb

## 2014-09-21 DIAGNOSIS — R350 Frequency of micturition: Secondary | ICD-10-CM

## 2014-09-21 DIAGNOSIS — I82409 Acute embolism and thrombosis of unspecified deep veins of unspecified lower extremity: Secondary | ICD-10-CM | POA: Diagnosis not present

## 2014-09-21 DIAGNOSIS — R399 Unspecified symptoms and signs involving the genitourinary system: Secondary | ICD-10-CM

## 2014-09-21 DIAGNOSIS — Z5181 Encounter for therapeutic drug level monitoring: Secondary | ICD-10-CM | POA: Diagnosis not present

## 2014-09-21 LAB — POCT URINALYSIS DIPSTICK
Bilirubin, UA: NEGATIVE
Glucose, UA: NEGATIVE
Ketones, UA: NEGATIVE
Nitrite, UA: NEGATIVE
PROTEIN UA: NEGATIVE
RBC UA: NEGATIVE
Spec Grav, UA: 1.015
Urobilinogen, UA: 2
pH, UA: 7.5

## 2014-09-21 MED ORDER — TERCONAZOLE 0.4 % VA CREA: 1.0000 | TOPICAL_CREAM | Freq: Every day | VAGINAL | Status: DC

## 2014-09-21 MED ORDER — CEPHALEXIN 500 MG PO CAPS: 500.0000 mg | ORAL_CAPSULE | Freq: Two times a day (BID) | ORAL | Status: DC

## 2014-09-21 MED ORDER — NYSTATIN-TRIAMCINOLONE 100000-0.1 UNIT/GM-% EX OINT: 1.0000 "application " | TOPICAL_OINTMENT | Freq: Two times a day (BID) | CUTANEOUS | Status: DC

## 2014-09-21 NOTE — Assessment & Plan Note (Signed)
Pt's sxs and UA consistent w/ infxn.  Pt has hx of recurrent UTIs but this is complicated by hx of recurrent vaginitis.  Pt was instructed to call GYN after last visit but this did not happen.  Will refill terconazole and Mycolog for pt to use for discomfort.  Start keflex.  Reviewed supportive care and red flags that should prompt return.  Pt expressed understanding and is in agreement w/ plan.

## 2014-09-21 NOTE — Patient Instructions (Signed)
Follow up as needed Start the Keflex twice daily x5 days Drink plenty of fluids Use the Terconazole and Mycolog as needed If urine culture is negative and symptoms are all yeast related, please call Dr Phineas Real Call with any questions or concerns Elbert Ewings in there!!!

## 2014-09-21 NOTE — Progress Notes (Signed)
   Subjective:    Patient ID: Sarah Mcguire, female    DOB: [birth year], 74 y.o.   MRN: [id number]  HPI ? UTI- pt feels she has both UTI and yeast.  + burning w/ urination.  Difficult to assess frequency due to Lasix.  Burning has improved yesterday and today.  Denies hesitancy.  No fever.  + LBP.  No vaginal d/c.  Using Terconazole cream for a few days but 'that burnt like anything'.   Review of Systems For ROS see HPI     Objective:   Physical Exam  Constitutional: She appears well-developed and well-nourished. No distress.  Abdominal: Soft. She exhibits no distension. There is no tenderness (no suprapubic or CVA tenderness).  Vitals reviewed.         Assessment & Plan:

## 2014-09-21 NOTE — Progress Notes (Signed)
Pre visit review using our clinic review tool, if applicable. No additional management support is needed unless otherwise documented below in the visit note. 

## 2014-09-22 LAB — URINE CULTURE

## 2014-09-24 ENCOUNTER — Institutional Professional Consult (permissible substitution): Payer: Medicare Other | Admitting: Cardiology

## 2014-09-28 ENCOUNTER — Encounter: Payer: Self-pay | Admitting: Gynecology

## 2014-09-28 ENCOUNTER — Encounter: Payer: Self-pay | Admitting: Family Medicine

## 2014-09-28 NOTE — Telephone Encounter (Signed)
Please inform the patient that she will need to get her cholesterol medication from her primary physician or gynecologist. I cannot prescribe medication for her husband as he is not a patient of mine.

## 2014-09-29 ENCOUNTER — Encounter: Payer: Self-pay | Admitting: Gynecology

## 2014-09-29 ENCOUNTER — Ambulatory Visit (INDEPENDENT_AMBULATORY_CARE_PROVIDER_SITE_OTHER): Payer: Medicare Other | Admitting: Gynecology

## 2014-09-29 VITALS — BP 138/84

## 2014-09-29 DIAGNOSIS — R300 Dysuria: Secondary | ICD-10-CM

## 2014-09-29 DIAGNOSIS — N760 Acute vaginitis: Secondary | ICD-10-CM

## 2014-09-29 LAB — URINALYSIS W MICROSCOPIC + REFLEX CULTURE
Bilirubin Urine: NEGATIVE
Crystals: NONE SEEN
GLUCOSE, UA: NEGATIVE mg/dL
HGB URINE DIPSTICK: NEGATIVE
Ketones, ur: NEGATIVE mg/dL
Nitrite: NEGATIVE
PROTEIN: NEGATIVE mg/dL
RBC / HPF: NONE SEEN RBC/hpf (ref <3)
SPECIFIC GRAVITY, URINE: 1.015 (ref 1.005–1.030)
UROBILINOGEN UA: 0.2 mg/dL (ref 0.0–1.0)
pH: 8 (ref 5.0–8.0)

## 2014-09-29 LAB — WET PREP FOR TRICH, YEAST, CLUE
Clue Cells Wet Prep HPF POC: NONE SEEN
TRICH WET PREP: NONE SEEN
Yeast Wet Prep HPF POC: NONE SEEN

## 2014-09-29 MED ORDER — BETAMETHASONE DIPROPIONATE 0.05 % EX CREA: TOPICAL_CREAM | Freq: Two times a day (BID) | CUTANEOUS | Status: DC

## 2014-09-29 MED ORDER — TERCONAZOLE 0.4 % VA CREA: 1.0000 | TOPICAL_CREAM | Freq: Every day | VAGINAL | Status: DC

## 2014-09-29 MED ORDER — FENOFIBRATE 160 MG PO TABS: ORAL_TABLET | ORAL | Status: DC

## 2014-09-29 NOTE — Addendum Note (Signed)
Addended by: Nelva Nay on: 09/29/2014 02:27 PM   Modules accepted: Orders

## 2014-09-29 NOTE — Telephone Encounter (Signed)
Med filled.  

## 2014-09-29 NOTE — Patient Instructions (Signed)
Apply the steroid, Diprolene cream externally each morning as needed for irritation. Insert the Terazol vaginal cream nightly for 7 nights. Use the second kit to insert one applicator vaginally once a week to hopefully keep a recurrent yeast from coming back.

## 2014-09-29 NOTE — Progress Notes (Signed)
Sarah Mcguire 09/20/1940 [id number]        73 y.o.  G2P2002 presents complaining of several weeks of vulvar itching and irritation as well as end stream dysuria. Long history of recurrent vaginitis treated chronically for yeast with nystatin powder and intermittent Terazol cream. Due to her Coumadin she cannot take Diflucan. Recently saw Dr. Birdie Riddle 09/21/2014. Initially thought to have UTI but urine did not grow out any bacteria.  Was given Mytrex/Terazol but continued irritation.  Past medical history,surgical history, problem list, medications, allergies, family history and social history were all reviewed and documented in the EPIC chart.  Directed ROS with pertinent positives and negatives documented in the history of present illness/assessment and plan.  Exam: Kim assistant General appearance:  Normal Abdomen soft nontender without masses guarding rebound Pelvic external BUS vagina with atrophic changes. No significant discharge or irritative changes. Bimanual without masses or tenderness.  Assessment/Plan:  74 y.o. [id number] with history of recurrent vaginitis. Wet prep unremarkable. Urinalysis does show 7-10 WBC and few bacteria. Also has many squamous cells. Given history as above will await culture and treat if needed. Recommend Diprolene 0.05%  cream externally on daily basis to help with the irritation.  2 separate 7 day Terazol prescriptions to use 1 nightly 7 days and then 1 applicator weekly for the next 2 months to see if we can suppress recurrence. If symptoms persist, worsen or recur.     Anastasio Auerbach MD, 12:31 PM 09/29/2014

## 2014-10-01 LAB — URINE CULTURE: Colony Count: 40000

## 2014-10-05 ENCOUNTER — Telehealth: Payer: Self-pay | Admitting: *Deleted

## 2014-10-05 NOTE — Telephone Encounter (Signed)
Urine did not grow out any specific bacteria.  Do not recommend antibiotics.

## 2014-10-05 NOTE — Telephone Encounter (Signed)
Pt informed with the below note. 

## 2014-10-05 NOTE — Telephone Encounter (Signed)
Pt called regarding recent urine culture results from [place] 09/29/14, should pt be on Rx? Please advise

## 2014-10-07 ENCOUNTER — Telehealth: Payer: Self-pay | Admitting: Family Medicine

## 2014-10-07 MED ORDER — HYDROCODONE-ACETAMINOPHEN 5-325 MG PO TABS: 1.0000 | ORAL_TABLET | ORAL | Status: DC | PRN

## 2014-10-07 NOTE — Telephone Encounter (Signed)
Caller name: Chiles,David Relation to pt: spouse  Call back number: [phone]   Reason for call:  Requesting a refill HYDROcodone-acetaminophen (NORCO/VICODIN) 5-325 MG per tablet

## 2014-10-07 NOTE — Telephone Encounter (Signed)
Med filled per verbal from provider.

## 2014-10-11 ENCOUNTER — Telehealth: Payer: Self-pay | Admitting: *Deleted

## 2014-10-11 DIAGNOSIS — M533 Sacrococcygeal disorders, not elsewhere classified: Secondary | ICD-10-CM | POA: Diagnosis not present

## 2014-10-11 NOTE — Telephone Encounter (Signed)
Pt called c/o top leg pain and back pain, no burning with urination, no vaginal problems was treated on OV 09/29/14. Pt asked if the leg pain could be related to yeast infection that was treated at [facility]. Pt said pain does not come and go it is continues, advised pt to see PCP.

## 2014-10-12 DIAGNOSIS — M533 Sacrococcygeal disorders, not elsewhere classified: Secondary | ICD-10-CM | POA: Diagnosis not present

## 2014-10-13 ENCOUNTER — Other Ambulatory Visit: Payer: Self-pay | Admitting: Gynecology

## 2014-10-14 ENCOUNTER — Ambulatory Visit (INDEPENDENT_AMBULATORY_CARE_PROVIDER_SITE_OTHER): Payer: Medicare Other | Admitting: General Practice

## 2014-10-14 DIAGNOSIS — Z5181 Encounter for therapeutic drug level monitoring: Secondary | ICD-10-CM

## 2014-10-14 LAB — POCT INR: INR: 2.9

## 2014-10-14 NOTE — Progress Notes (Signed)
Pre visit review using our clinic review tool, if applicable. No additional management support is needed unless otherwise documented below in the visit note. 

## 2014-10-18 ENCOUNTER — Ambulatory Visit: Payer: Medicare Other

## 2014-10-21 ENCOUNTER — Encounter: Payer: Self-pay | Admitting: Cardiology

## 2014-10-21 ENCOUNTER — Ambulatory Visit (INDEPENDENT_AMBULATORY_CARE_PROVIDER_SITE_OTHER): Payer: Medicare Other | Admitting: Cardiology

## 2014-10-21 VITALS — BP 128/80 | HR 96 | Ht 63.0 in | Wt 176.0 lb

## 2014-10-21 DIAGNOSIS — Z86711 Personal history of pulmonary embolism: Secondary | ICD-10-CM

## 2014-10-21 DIAGNOSIS — Z7901 Long term (current) use of anticoagulants: Secondary | ICD-10-CM

## 2014-10-21 DIAGNOSIS — I491 Atrial premature depolarization: Secondary | ICD-10-CM | POA: Diagnosis not present

## 2014-10-21 DIAGNOSIS — R9431 Abnormal electrocardiogram [ECG] [EKG]: Secondary | ICD-10-CM | POA: Diagnosis not present

## 2014-10-21 DIAGNOSIS — I10 Essential (primary) hypertension: Secondary | ICD-10-CM | POA: Diagnosis not present

## 2014-10-21 NOTE — Patient Instructions (Signed)
The current medical regimen is effective;  continue present plan and medications.  Follow up as needed.  Thank you for choosing [facility]!!

## 2014-10-21 NOTE — Progress Notes (Signed)
[facility]. [address] [place], [place]  [address] Phone: [phone] Fax:  [phone]  Date:  10/21/2014   ID:  Sarah Mcguire, DOB 1941/03/11, MRN [id number]  PCP:  Annye Asa, MD   History of Present Illness: Sarah Mcguire is a 74 y.o. female here for the evaluation of atrial flutter. She had knee replacement a few years ago and  had postop atrial fibrillation which was brief, short lived. EKG from late December 2015 was interpreted by the computer is atrial flutter. Upon close inspection however this looks like sinus rhythm with broad-based P waves. She has been on chronic Coumadin for several years. In review of history, she had a severe saddle pulmonary embolus in 2013. She also had DVT which she states did not completely resolve after leg immobilization. Apparently she is on lifelong Coumadin. She and her husband express that she is upset that she cannot take certain medications because of her Coumadin i.e. antifungal/yeast medications. They told me about one experience where her INR became quite elevated. She also states that having knee replacement was the worst thing that she had ever done. They wondered why they were on Lasix and question possible fluid around the heart. After review of echocardiogram that was most recently done in 2014, her ejection fraction was reduced, 45% range. It is likely that Lasix was started at that time to help prevent signs of heart failure. She was also upset that it took her 15 weeks for her visit to occur. It turns out that she did have a visit earlier but due to inclement weather this had to be rescheduled.  She has described some heartburn-like symptoms which are resolved with antacid medications. Such as Tums. Her husband stated that he used to take Nexium but then stopped after he developed pneumonia.   Wt Readings from Last 3 Encounters:  10/21/14 176 lb (79.833 kg)  09/21/14 182 lb 3.2 oz (82.645 kg)  08/30/14 180 lb 6 oz  (81.818 kg)     Past Medical History  Diagnosis Date  . Hyperlipidemia   . Hypertension   . Hyperglycemia   . GERD (gastroesophageal reflux disease)   . Anemia   . Leukocytosis   . Diverticulitis   . Neuropathy   . Anxiety     SEVERE  . 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 DVT (deep venous thrombosis)   . Diabetes mellitus     borderline no medications  . Arthritis   . COPD (chronic obstructive pulmonary disease)   . Atrial fibrillation with RVR 11/28/2012    Past Surgical History  Procedure Laterality Date  . Tonsillectomy    . Tympanostomy tube placement      R ear  . 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 acetaminophen (TYLENOL) 325 MG tablet Take 325 mg by mouth every 6 (six) hours as needed (for headache).    Marland Kitchen alprazolam (XANAX) 2 MG tablet TAKE 1/2-1 TABLET BY MOUTH 3 TIMES A DAY AS NEEDED FOR ANXIETY OR SLEEP 90 tablet 1  . betamethasone dipropionate (DIPROLENE) 0.05 % cream Apply topically 2 (two) times daily. As needed for itching 30 g 2  . Cholecalciferol (VITAMIN  D) 2000 UNITS tablet Take 2,000 Units by mouth daily.    Marland Kitchen diltiazem (CARDIZEM CD) 120 MG 24 hr capsule Take 1 capsule (120 mg total) by mouth daily. 90 capsule 3  . esomeprazole (NEXIUM) 40 MG capsule Take 40 mg by mouth daily as needed. For acid reflux.    . fenofibrate 160 MG tablet TAKE 1 TABLET (160 MG TOTAL) BY MOUTH DAILY. 90 tablet 1  . folic acid (FOLVITE) 1 MG tablet Take 1 tablet (1 mg total) by mouth daily. 90 tablet 1  . furosemide (LASIX) 40 MG tablet Take 1 tablet (40 mg total) by mouth daily. 90 tablet 3  . HYDROcodone-acetaminophen (NORCO/VICODIN) 5-325 MG per tablet Take 1-2 tablets by  mouth every 4 (four) hours as needed. 90 tablet 0  . hydrocortisone-pramoxine (ANALPRAM-HC) 2.5-1 % rectal cream Place 1 application rectally 3 (three) times daily as needed.    . lidocaine (LIDODERM) 5 % Place 1 patch onto the skin daily. Remove & Discard patch within 12 hours or as directed by MD    . lisinopril (PRINIVIL,ZESTRIL) 10 MG tablet Take 1 tablet (10 mg total) by mouth daily. 90 tablet 1  . metFORMIN (GLUCOPHAGE-XR) 500 MG 24 hr tablet Take 1 tablet by mouth twice daily with food. 180 tablet 1  . methocarbamol (ROBAXIN) 500 MG tablet Take 500 mg by mouth 3 (three) times daily.   0  . Multiple Vitamin (MULTIVITAMIN) tablet Take 1 tablet by mouth daily.      Marland Kitchen nystatin (MYCOSTATIN/NYSTOP) 100000 UNIT/GM POWD Apply powder to the outside of the groin as needed for irritaion 30 g 0  . nystatin-triamcinolone ointment (MYCOLOG) Apply 1 application topically 2 (two) times daily. 30 g 0  . ONE TOUCH ULTRA TEST test strip TEST TWICE A DAY AS DIRECTED 100 each 3  . simvastatin (ZOCOR) 20 MG tablet Take 1 tablet (20 mg total) by mouth every evening. 90 tablet 1  . terconazole (TERAZOL 7) 0.4 % vaginal cream PLACE 1 APPLICATOR VAGINALLY AT BEDTIME. FOR 7 NIGHTS AND THEN 1 APPLICATOR WEEKLY FOR 2 MONTHS 90 g 1  . warfarin (COUMADIN) 5 MG tablet TAKE 1 TABLET ON TUESDAY, THURSDAY, SATURDAY, AND SUNDAY. TAKE 1/2 TABLET BY MOUTH THE REMAINING DAYS.     No current facility-administered medications for this visit.    Allergies:    Allergies  Allergen Reactions  . Morphine And Related     Usually drops her blood pressure  . Amoxicillin Other (See Comments)    Unknown   . Amoxicillin-Pot Clavulanate Other (See Comments)    Unknown   . Citalopram Hydrobromide Other (See Comments)    Itching   . Clonazepam Itching  . Cymbalta [Duloxetine Hcl] Other (See Comments)    Nausea, headache and diarrhea   . Macrobid [Nitrofurantoin Macrocrystal] Other (See Comments)    Nausea, vomitting  .  Penicillins Other (See Comments)    Unknown   . Pyridium [Phenazopyridine Hcl] Other (See Comments)    headache  . Zonegran Other (See Comments)    Unknown   . Zonisamide Other (See Comments)    Unknown   . Sertraline Hcl Anxiety  . Wellbutrin [Bupropion] Itching and Rash    Itching, rash, hyper     Social History:  The patient  reports that she has been smoking Cigarettes.  She has been smoking about 0.80 packs per day. She has never used smokeless tobacco. She reports that she does not drink alcohol or use illicit drugs.   Family  History  Problem Relation Age of Onset  . Diabetes Mother   . Hypertension Mother   . Heart disease Father     MI age 24's   . Diabetes Brother   . Hypertension Brother     ROS:  Please see the history of present illness.   Denies any syncope, bleeding, orthopnea, PND  All other systems reviewed and negative.   PHYSICAL EXAM: VS:  BP 128/80 mmHg  Pulse 96  Ht 5\' 3"  (1.6 m)  Wt 176 lb (79.833 kg)  BMI 31.18 kg/m2 Well nourished, well developed, in no acute distress HEENT: normal, [place]/AT, EOMI Neck: no JVD, normal carotid upstroke, no bruit Cardiac:  normal S1, S2; RRR; no murmur Lungs:  clear to auscultation bilaterally, no wheezing, rhonchi or rales Abd: soft, nontender, no hepatomegaly, no bruits Ext: no edema, 2+ distal pulses Skin: warm and dry GU: deferred Neuro: no focal abnormalities noted, AAO x 3, anxious  EKG:  10/21/14-sinus rhythm with premature atrial complexes, heart rate 90, borderline LVH. Prior EKG reviewed from late December 2015 which was interpreted by the computer as atrial flutter. Upon close inspection this looks like sinus rhythm. She does have broad-based P waves noted on prior EKGs. PACs were also noted previously.   Echocardiogram: 2014-ejection fraction 45%.   ASSESSMENT AND PLAN:  1. Abnormal EKG-after close inspection, she does not appear to have atrial flutter on EKG from late December 2015.  Reassurance.  Nonetheless, she remains on lifelong Coumadin which would protect her in K she did have an atrial arrhythmia. Since she is feeling well, no worsening dyspnea, orthopnea, she was not interested in proceeding with repeat echocardiogram to reevaluate her ejection fraction. This seems reasonable. 2. History of saddle pulmonary embolus-lifelong Coumadin. I asked her to discuss with Dr. Birdie Riddle the possibility of switching to a novel anticoagulant. This may allow her to liberalize her diet and potentially take antifungal medications. Husband's response was with a question, are those the drugs with all the class action lawsuits. It would not be unreasonable to contemplate switch. 3. Left ventricular systolic dysfunction-EF 02% range no current symptoms. Continue with ACE inhibitor. She is currently on low-dose diltiazem, good rate control. My suspicion is that she may have not tolerated beta blocker in the past. It is not unreasonable for her to continue with her Lasix to help prevent fluid overload. 4. GERD-like symptoms-if symptoms become worse or more worrisome, one can always contemplate further evaluation with repeat stress testing. She has had a stress test in the past which was reassuring. For now continue with antacid therapy as needed. 5. Chronic anticoagulation-Coumadin. 6. When necessary follow-up.  Signed, Candee Furbish, MD [facility]  10/21/2014 11:47 AM

## 2014-11-08 ENCOUNTER — Ambulatory Visit (INDEPENDENT_AMBULATORY_CARE_PROVIDER_SITE_OTHER): Payer: Medicare Other | Admitting: General Practice

## 2014-11-08 ENCOUNTER — Other Ambulatory Visit: Payer: Self-pay | Admitting: General Practice

## 2014-11-08 DIAGNOSIS — Z5181 Encounter for therapeutic drug level monitoring: Secondary | ICD-10-CM

## 2014-11-08 DIAGNOSIS — R609 Edema, unspecified: Secondary | ICD-10-CM

## 2014-11-08 DIAGNOSIS — M791 Myalgia: Secondary | ICD-10-CM | POA: Diagnosis not present

## 2014-11-08 LAB — POCT INR: INR: 3

## 2014-11-08 MED ORDER — WARFARIN SODIUM 5 MG PO TABS: ORAL_TABLET | ORAL | Status: DC

## 2014-11-08 MED ORDER — FUROSEMIDE 40 MG PO TABS: 40.0000 mg | ORAL_TABLET | Freq: Every day | ORAL | Status: DC

## 2014-11-08 NOTE — Progress Notes (Signed)
Pre visit review using our clinic review tool, if applicable. No additional management support is needed unless otherwise documented below in the visit note. 

## 2014-11-09 DIAGNOSIS — M791 Myalgia: Secondary | ICD-10-CM | POA: Diagnosis not present

## 2014-11-13 ENCOUNTER — Encounter: Payer: Self-pay | Admitting: Family Medicine

## 2014-11-13 ENCOUNTER — Other Ambulatory Visit: Payer: Self-pay | Admitting: Family Medicine

## 2014-11-15 ENCOUNTER — Other Ambulatory Visit: Payer: Self-pay | Admitting: Family Medicine

## 2014-11-15 MED ORDER — ALPRAZOLAM 2 MG PO TABS: ORAL_TABLET | ORAL | Status: DC

## 2014-11-15 NOTE — Telephone Encounter (Signed)
Med denied, due to being filled earlier today.

## 2014-11-15 NOTE — Telephone Encounter (Signed)
Med filled.  

## 2014-11-18 ENCOUNTER — Telehealth: Payer: Self-pay | Admitting: Family Medicine

## 2014-11-18 MED ORDER — HYDROCODONE-ACETAMINOPHEN 5-325 MG PO TABS: 1.0000 | ORAL_TABLET | ORAL | Status: DC | PRN

## 2014-11-18 NOTE — Telephone Encounter (Signed)
Caller name: david Relation to pt: husband Call back number: [phone] Pharmacy:  Reason for call:   Requesting a new hydrocodone rx

## 2014-11-18 NOTE — Telephone Encounter (Signed)
Medication filled today and pt notified available at front desk for pick up.

## 2014-11-18 NOTE — Telephone Encounter (Signed)
Last OV 09/21/14 Hydrocodone last filled 10-07-14 #90 with 0

## 2014-11-18 NOTE — Telephone Encounter (Signed)
Ok for 90

## 2014-11-22 DIAGNOSIS — M791 Myalgia: Secondary | ICD-10-CM | POA: Diagnosis not present

## 2014-11-23 ENCOUNTER — Other Ambulatory Visit: Payer: Self-pay | Admitting: General Practice

## 2014-11-23 MED ORDER — METFORMIN HCL ER 500 MG PO TB24: ORAL_TABLET | ORAL | Status: DC

## 2014-12-06 ENCOUNTER — Ambulatory Visit (INDEPENDENT_AMBULATORY_CARE_PROVIDER_SITE_OTHER): Payer: Medicare Other | Admitting: General Practice

## 2014-12-06 DIAGNOSIS — Z5181 Encounter for therapeutic drug level monitoring: Secondary | ICD-10-CM

## 2014-12-06 LAB — POCT INR: INR: 2.9

## 2014-12-06 NOTE — Progress Notes (Signed)
Pre visit review using our clinic review tool, if applicable. No additional management support is needed unless otherwise documented below in the visit note. 

## 2014-12-14 ENCOUNTER — Other Ambulatory Visit: Payer: Self-pay | Admitting: General Practice

## 2014-12-14 MED ORDER — SIMVASTATIN 20 MG PO TABS: 20.0000 mg | ORAL_TABLET | Freq: Every evening | ORAL | Status: DC

## 2014-12-20 ENCOUNTER — Other Ambulatory Visit: Payer: Medicare Other

## 2014-12-20 ENCOUNTER — Telehealth: Payer: Self-pay | Admitting: Family Medicine

## 2014-12-20 ENCOUNTER — Encounter: Payer: Self-pay | Admitting: Family Medicine

## 2014-12-20 ENCOUNTER — Ambulatory Visit (INDEPENDENT_AMBULATORY_CARE_PROVIDER_SITE_OTHER): Payer: Medicare Other | Admitting: Family Medicine

## 2014-12-20 ENCOUNTER — Other Ambulatory Visit: Payer: Self-pay | Admitting: Family Medicine

## 2014-12-20 VITALS — BP 126/82 | HR 101 | Temp 97.9°F | Resp 16 | Wt 178.2 lb

## 2014-12-20 DIAGNOSIS — E038 Other specified hypothyroidism: Secondary | ICD-10-CM

## 2014-12-20 DIAGNOSIS — E785 Hyperlipidemia, unspecified: Secondary | ICD-10-CM | POA: Diagnosis not present

## 2014-12-20 DIAGNOSIS — E1169 Type 2 diabetes mellitus with other specified complication: Secondary | ICD-10-CM | POA: Diagnosis not present

## 2014-12-20 DIAGNOSIS — E1140 Type 2 diabetes mellitus with diabetic neuropathy, unspecified: Secondary | ICD-10-CM | POA: Diagnosis not present

## 2014-12-20 DIAGNOSIS — Z23 Encounter for immunization: Secondary | ICD-10-CM

## 2014-12-20 DIAGNOSIS — I10 Essential (primary) hypertension: Secondary | ICD-10-CM

## 2014-12-20 DIAGNOSIS — E1340 Other specified diabetes mellitus with diabetic neuropathy, unspecified: Secondary | ICD-10-CM

## 2014-12-20 LAB — CBC WITH DIFFERENTIAL/PLATELET
BASOS ABS: 0 10*3/uL (ref 0.0–0.1)
Basophils Relative: 0.6 % (ref 0.0–3.0)
EOS ABS: 0.1 10*3/uL (ref 0.0–0.7)
Eosinophils Relative: 1.6 % (ref 0.0–5.0)
HEMATOCRIT: 46.4 % — AB (ref 36.0–46.0)
Hemoglobin: 15.4 g/dL — ABNORMAL HIGH (ref 12.0–15.0)
Lymphocytes Relative: 35.7 % (ref 12.0–46.0)
Lymphs Abs: 2.4 10*3/uL (ref 0.7–4.0)
MCHC: 33.3 g/dL (ref 30.0–36.0)
MCV: 85.5 fl (ref 78.0–100.0)
Monocytes Absolute: 0.5 10*3/uL (ref 0.1–1.0)
Monocytes Relative: 7.8 % (ref 3.0–12.0)
NEUTROS ABS: 3.7 10*3/uL (ref 1.4–7.7)
NEUTROS PCT: 54.3 % (ref 43.0–77.0)
Platelets: 227 10*3/uL (ref 150.0–400.0)
RBC: 5.42 Mil/uL — ABNORMAL HIGH (ref 3.87–5.11)
RDW: 15.4 % (ref 11.5–15.5)
WBC: 6.8 10*3/uL (ref 4.0–10.5)

## 2014-12-20 LAB — LIPID PANEL
Cholesterol: 153 mg/dL (ref 0–200)
HDL: 46.8 mg/dL (ref >39.00)
LDL Cholesterol: 70 mg/dL (ref 0–99)
NONHDL: 106.2
Total CHOL/HDL Ratio: 3
Triglycerides: 183 mg/dL — ABNORMAL HIGH (ref 0.0–149.0)
VLDL: 36.6 mg/dL (ref 0.0–40.0)

## 2014-12-20 LAB — HEMOGLOBIN A1C: Hgb A1c MFr Bld: 7.5 % — ABNORMAL HIGH (ref 4.6–6.5)

## 2014-12-20 LAB — HEPATIC FUNCTION PANEL
ALT: 21 U/L (ref 0–35)
AST: 23 U/L (ref 0–37)
Albumin: 4.3 g/dL (ref 3.5–5.2)
Alkaline Phosphatase: 46 U/L (ref 39–117)
BILIRUBIN DIRECT: 0 mg/dL (ref 0.0–0.3)
Total Bilirubin: 0.3 mg/dL (ref 0.2–1.2)
Total Protein: 7.1 g/dL (ref 6.0–8.3)

## 2014-12-20 LAB — BASIC METABOLIC PANEL
BUN: 28 mg/dL — ABNORMAL HIGH (ref 6–23)
CHLORIDE: 102 meq/L (ref 96–112)
CO2: 30 meq/L (ref 19–32)
Calcium: 10.9 mg/dL — ABNORMAL HIGH (ref 8.4–10.5)
Creatinine, Ser: 0.92 mg/dL (ref 0.40–1.20)
GFR: 63.3 mL/min (ref >60.00)
GLUCOSE: 130 mg/dL — AB (ref 70–99)
Potassium: 4 mEq/L (ref 3.5–5.1)
Sodium: 142 mEq/L (ref 135–145)

## 2014-12-20 LAB — TSH: TSH: 5.57 u[IU]/mL — ABNORMAL HIGH (ref 0.35–4.50)

## 2014-12-20 MED ORDER — METFORMIN HCL ER 500 MG PO TB24: ORAL_TABLET | ORAL | Status: DC

## 2014-12-20 MED ORDER — LEVOTHYROXINE SODIUM 50 MCG PO TABS: 50.0000 ug | ORAL_TABLET | Freq: Every day | ORAL | Status: DC

## 2014-12-20 MED ORDER — HYDROCODONE-ACETAMINOPHEN 5-325 MG PO TABS: 1.0000 | ORAL_TABLET | ORAL | Status: DC | PRN

## 2014-12-20 NOTE — Patient Instructions (Signed)
Schedule your complete physical in 3-4 months We'll notify you of your lab results and make any changes if needed Keep up the good work!  You look great! Call with any questions or concerns [place]!!! 

## 2014-12-20 NOTE — Telephone Encounter (Signed)
Spouse stated will keep the 03/31/2015 at 8am due to fasting.

## 2014-12-20 NOTE — Progress Notes (Signed)
   Subjective:    Patient ID: Sarah Mcguire, female    DOB: [birth year], 74 y.o.   MRN: [id number]  HPI DM- chronic problem, on Metformin.  On ACE for renal protection.  UTD on eye exam (cataract surgery).  Pt reports 3 steroid injxns in back since last visit so is concerned about A1C.  Home CBGs 'right below 120'.  Denies symptomatic lows.  + neuropathy- no changes.  HTN- chronic problem, on Lasix, Lisinopril, Dilt.  No CP, SOB, HAs, visual changes.  Hyperlipidemia- chronic problem, on Simvastatin, Fenofibrate.  Denies abd pain, N/V, myalgias.   Review of Systems For ROS see HPI     Objective:   Physical Exam  Constitutional: She is oriented to person, place, and time. She appears well-developed and well-nourished. No distress.  HENT:  Head: Normocephalic and atraumatic.  Eyes: Conjunctivae and EOM are normal. Pupils are equal, round, and reactive to light.  Neck: Normal range of motion. Neck supple. No thyromegaly present.  Cardiovascular: Normal rate, regular rhythm, normal heart sounds and intact distal pulses.   No murmur heard. Pulmonary/Chest: Effort normal and breath sounds normal. No respiratory distress.  Abdominal: Soft. She exhibits no distension. There is no tenderness.  Musculoskeletal: She exhibits no edema.  Lymphadenopathy:    She has no cervical adenopathy.  Neurological: She is alert and oriented to person, place, and time.  Skin: Skin is warm and dry.  Psychiatric: She has a normal mood and affect. Her behavior is normal.  Vitals reviewed.         Assessment & Plan:

## 2014-12-20 NOTE — Progress Notes (Signed)
Pre visit review using our clinic review tool, if applicable. No additional management support is needed unless otherwise documented below in the visit note. 

## 2014-12-20 NOTE — Assessment & Plan Note (Signed)
Chronic problem for pt.  Currently on Metformin.  Husband reports 'she's maxxed out on Cinnamon so you better do something'.  UTD on eye exam.  On ACE for renal protection.  CBGs are well controlled.  Chronic but stable neuropathy.  Check labs.  Adjust meds prn

## 2014-12-20 NOTE — Assessment & Plan Note (Signed)
Chronic problem.  Tolerating statin w/o difficulty.  Check labs.  Adjust meds prn  

## 2014-12-20 NOTE — Telephone Encounter (Signed)
Can e see if she wants the 7/14 CPE @9am ?

## 2014-12-20 NOTE — Assessment & Plan Note (Signed)
Chronic problem.  Well controlled.  Currently asymptomatic.  Check labs.  No anticipated med changes. 

## 2014-12-20 NOTE — Telephone Encounter (Signed)
Called and spoke with pt and went over labs.

## 2014-12-20 NOTE — Addendum Note (Signed)
Addended by: Kris Hartmann on: 12/20/2014 09:19 AM   Modules accepted: Orders

## 2014-12-20 NOTE — Telephone Encounter (Signed)
Caller name: Shanon Brow Relation to pt: husband Call back number: [phone] Pharmacy:  Reason for call:   Patient husband states that results from today was sent through mychart. He would like to talk to [facility] regarding this.

## 2014-12-20 NOTE — Telephone Encounter (Signed)
Relation to pt: self  Call back number: [phone]   Reason for call:  Pt is concerned MD wanted to see her in 3 month for CPE but due to MD schedule first morning appointment is not until Oct. Pt was inquiring about lab work in 3 months (A1C). Please advise

## 2014-12-22 DIAGNOSIS — M791 Myalgia: Secondary | ICD-10-CM | POA: Diagnosis not present

## 2014-12-24 ENCOUNTER — Other Ambulatory Visit (INDEPENDENT_AMBULATORY_CARE_PROVIDER_SITE_OTHER): Payer: Medicare Other

## 2014-12-24 ENCOUNTER — Telehealth: Payer: Self-pay | Admitting: Family Medicine

## 2014-12-24 ENCOUNTER — Other Ambulatory Visit: Payer: Medicare Other

## 2014-12-24 LAB — PTH, INTACT AND CALCIUM

## 2014-12-24 NOTE — Telephone Encounter (Signed)
Called pt husband back and apologized for the confusion. Advised that our office not aware of the issue until they had come in to have labs drawn. Told pt that the main reason we schedule appts is to ensure that we have people in place when pt's will be in the office. They stated an understanding.   While on the phone pt advised that she takes one metformin in the morning and then an hour later she takes the second. Told pt that was ok. Pt was also wondering if it was ok to increase metformin to 4 pills a day due to steroid injection in her hip. Told pt no because we have to ensure that the medication is working the way it is suppose to and that her kidneys are able to tolerate.

## 2014-12-24 NOTE — Telephone Encounter (Signed)
Caller name: david Relation to pt:  husband Call back number:  [phone] Pharmacy:  Reason for call:   Please call patient. She states that there was total confusion today when she came in for her labs???

## 2014-12-24 NOTE — Telephone Encounter (Signed)
agree

## 2014-12-27 LAB — PTH, INTACT AND CALCIUM
CALCIUM: 9.5 mg/dL (ref 8.4–10.5)
PTH: 43 pg/mL (ref 14–64)

## 2015-01-03 ENCOUNTER — Ambulatory Visit (INDEPENDENT_AMBULATORY_CARE_PROVIDER_SITE_OTHER): Payer: Medicare Other | Admitting: General Practice

## 2015-01-03 DIAGNOSIS — Z5181 Encounter for therapeutic drug level monitoring: Secondary | ICD-10-CM

## 2015-01-03 LAB — POCT INR: INR: 2.8

## 2015-01-03 NOTE — Progress Notes (Signed)
Pre visit review using our clinic review tool, if applicable. No additional management support is needed unless otherwise documented below in the visit note. 

## 2015-01-11 ENCOUNTER — Encounter: Payer: Self-pay | Admitting: Family Medicine

## 2015-01-12 ENCOUNTER — Encounter: Payer: Self-pay | Admitting: Family Medicine

## 2015-01-12 ENCOUNTER — Other Ambulatory Visit: Payer: Self-pay | Admitting: Family Medicine

## 2015-01-12 NOTE — Telephone Encounter (Signed)
Med filled.  

## 2015-01-12 NOTE — Telephone Encounter (Signed)
Last OV 12-20-14 Alprazolam last filled 11-15-14 #90 with 1

## 2015-01-16 ENCOUNTER — Encounter: Payer: Self-pay | Admitting: Family Medicine

## 2015-01-17 ENCOUNTER — Encounter: Payer: Self-pay | Admitting: Gynecology

## 2015-01-17 ENCOUNTER — Ambulatory Visit (INDEPENDENT_AMBULATORY_CARE_PROVIDER_SITE_OTHER): Payer: Medicare Other | Admitting: Gynecology

## 2015-01-17 ENCOUNTER — Other Ambulatory Visit: Payer: Self-pay | Admitting: General Practice

## 2015-01-17 ENCOUNTER — Other Ambulatory Visit (INDEPENDENT_AMBULATORY_CARE_PROVIDER_SITE_OTHER): Payer: Medicare Other

## 2015-01-17 ENCOUNTER — Telehealth: Payer: Self-pay | Admitting: Family Medicine

## 2015-01-17 ENCOUNTER — Encounter: Payer: Self-pay | Admitting: Family Medicine

## 2015-01-17 VITALS — BP 130/84

## 2015-01-17 DIAGNOSIS — E038 Other specified hypothyroidism: Secondary | ICD-10-CM | POA: Diagnosis not present

## 2015-01-17 DIAGNOSIS — R300 Dysuria: Secondary | ICD-10-CM | POA: Diagnosis not present

## 2015-01-17 DIAGNOSIS — N898 Other specified noninflammatory disorders of vagina: Secondary | ICD-10-CM

## 2015-01-17 DIAGNOSIS — E1340 Other specified diabetes mellitus with diabetic neuropathy, unspecified: Secondary | ICD-10-CM | POA: Diagnosis not present

## 2015-01-17 LAB — WET PREP FOR TRICH, YEAST, CLUE
CLUE CELLS WET PREP: NONE SEEN
TRICH WET PREP: NONE SEEN

## 2015-01-17 LAB — URINALYSIS W MICROSCOPIC + REFLEX CULTURE
Bilirubin Urine: NEGATIVE
Casts: NONE SEEN
Crystals: NONE SEEN
Glucose, UA: NEGATIVE mg/dL
Hgb urine dipstick: NEGATIVE
Ketones, ur: NEGATIVE mg/dL
Nitrite: NEGATIVE
Protein, ur: NEGATIVE mg/dL
RBC / HPF: NONE SEEN RBC/hpf (ref <3)
Specific Gravity, Urine: 1.015 (ref 1.005–1.030)
Urobilinogen, UA: 0.2 mg/dL (ref 0.0–1.0)
pH: 8.5 — ABNORMAL HIGH (ref 5.0–8.0)

## 2015-01-17 LAB — BASIC METABOLIC PANEL
BUN: 24 mg/dL — ABNORMAL HIGH (ref 6–23)
CALCIUM: 10.5 mg/dL (ref 8.4–10.5)
CHLORIDE: 101 meq/L (ref 96–112)
CO2: 30 meq/L (ref 19–32)
Creatinine, Ser: 0.9 mg/dL (ref 0.40–1.20)
GFR: 64.91 mL/min (ref >60.00)
GLUCOSE: 209 mg/dL — AB (ref 70–99)
Potassium: 3.9 mEq/L (ref 3.5–5.1)
SODIUM: 139 meq/L (ref 135–145)

## 2015-01-17 LAB — TSH: TSH: 2.04 u[IU]/mL (ref 0.35–4.50)

## 2015-01-17 MED ORDER — TERCONAZOLE 0.4 % VA CREA: 1.0000 | TOPICAL_CREAM | Freq: Every day | VAGINAL | Status: DC

## 2015-01-17 MED ORDER — HYDROCODONE-ACETAMINOPHEN 5-325 MG PO TABS: 1.0000 | ORAL_TABLET | ORAL | Status: DC | PRN

## 2015-01-17 MED ORDER — LEVOTHYROXINE SODIUM 50 MCG PO TABS: 50.0000 ug | ORAL_TABLET | Freq: Every day | ORAL | Status: DC

## 2015-01-17 NOTE — Telephone Encounter (Signed)
Last OV 12/20/14 Hydrocodone last filled 12-20-14 #90 with 0

## 2015-01-17 NOTE — Patient Instructions (Addendum)
Use 1 applicator of the Terazol cream in the vagina nightly for 7 nights.   Repeat if needed.  Follow up if symptoms persist or worsen.

## 2015-01-17 NOTE — Addendum Note (Signed)
Addended by: Nelva Nay on: 01/17/2015 11:39 AM   Modules accepted: Orders

## 2015-01-17 NOTE — Telephone Encounter (Signed)
Ok for 90

## 2015-01-17 NOTE — Telephone Encounter (Signed)
Med filled and pt notified.  

## 2015-01-17 NOTE — Telephone Encounter (Signed)
Caller name: Taziyah Relation to pt: self Call back [phone] or [phone]  Pharmacy: [id number] - [place], [place]  Reason for call: Pt came in office for labs and is wanting refill on rx HYDROcodone-acetaminophen (NORCO/VICODIN) 5-325 MG per tablet. Please advise.

## 2015-01-17 NOTE — Progress Notes (Signed)
Sarah Mcguire 02/02/1941 [id number]        74 y.o.  [id number] Presents complaining of a tender area at the opening of her vagina. Notes onset after a vigorous after where she "scrubbed" the area to make sure it was clean. Now she has a soreness irritation and some and stream dysuria.  No fever chills nausea vomiting diarrhea constipation. No vaginal odor or significant discharge.  Past medical history,surgical history, problem list, medications, allergies, family history and social history were all reviewed and documented in the EPIC chart.  Directed ROS with pertinent positives and negatives documented in the history of present illness/assessment and plan.  Exam: Kim assistant Filed Vitals:   01/17/15 1002  BP: 130/84   General appearance:  Normal Abdomen soft nontender without masses guarding rebound External BUS vagina with atrophic changes. Small superficial abrasion-like area in her lower labia minora lateral to the posterior fourchette.  Vagina without significant discharge. Cervix atrophic uterus difficult to palpate but grossly normal midline mobile nontender. Adnexa without masses or tenderness  Assessment/Plan:  74 y.o. G2P2002 with small abrasion left inner labia which I think is traumatic from her bath. Wet prep did show few yeast. Will cover with terconazole cream nightly 7 days. 2 kits given at patient's request to have available. Urinalysis showed 3-6 WBC few bacteria. Will await culture and treat if positive. Follow up if symptoms persist, worsen or recur.    Anastasio Auerbach MD, 10:33 AM 01/17/2015

## 2015-01-19 ENCOUNTER — Telehealth: Payer: Self-pay

## 2015-01-19 ENCOUNTER — Telehealth: Payer: Self-pay | Admitting: *Deleted

## 2015-01-19 ENCOUNTER — Telehealth: Payer: Self-pay | Admitting: Family Medicine

## 2015-01-19 LAB — URINE CULTURE
COLONY COUNT: NO GROWTH
ORGANISM ID, BACTERIA: NO GROWTH

## 2015-01-19 NOTE — Telephone Encounter (Signed)
Patient deferred a 12/2014 office visit. Was "postponed" so contraindications cannot be entered.

## 2015-01-19 NOTE — Telephone Encounter (Signed)
Pt informed that recent urine culture show no growth.

## 2015-01-19 NOTE — Telephone Encounter (Signed)
error 

## 2015-01-20 ENCOUNTER — Telehealth: Payer: Self-pay

## 2015-01-20 NOTE — Telephone Encounter (Signed)
Patient called the office on Wednesday 01/19/2015, to report an issue that she had with the lab. The matter was discussed with the patient and a resolution was reached. Patient agrees with plan for future labs draws. Will discuss with PCP.

## 2015-01-24 ENCOUNTER — Ambulatory Visit (INDEPENDENT_AMBULATORY_CARE_PROVIDER_SITE_OTHER): Payer: Medicare Other | Admitting: General Practice

## 2015-01-24 DIAGNOSIS — I82409 Acute embolism and thrombosis of unspecified deep veins of unspecified lower extremity: Secondary | ICD-10-CM | POA: Diagnosis not present

## 2015-01-24 DIAGNOSIS — Z5181 Encounter for therapeutic drug level monitoring: Secondary | ICD-10-CM

## 2015-01-24 LAB — POCT INR: INR: 2.8

## 2015-01-24 NOTE — Progress Notes (Signed)
Pre visit review using our clinic review tool, if applicable. No additional management support is needed unless otherwise documented below in the visit note. 

## 2015-02-02 ENCOUNTER — Telehealth: Payer: Self-pay | Admitting: Family Medicine

## 2015-02-02 NOTE — Telephone Encounter (Signed)
Relation to pt: self  Call back [phone]   Reason for call:   Pt states her 03/31/2015 physical appointment had to be [facility] and would like her a1c checked before the next available morning physical which is in November. Pt requesting lab orders

## 2015-02-02 NOTE — Telephone Encounter (Signed)
Pt scheduled diabetes follow up for 03/18/2015

## 2015-02-02 NOTE — Telephone Encounter (Signed)
Ok to have a diabetes follow up in July or august.

## 2015-02-04 ENCOUNTER — Encounter: Payer: Self-pay | Admitting: Gynecology

## 2015-02-04 ENCOUNTER — Ambulatory Visit (INDEPENDENT_AMBULATORY_CARE_PROVIDER_SITE_OTHER): Payer: Medicare Other | Admitting: Gynecology

## 2015-02-04 ENCOUNTER — Telehealth: Payer: Self-pay | Admitting: *Deleted

## 2015-02-04 VITALS — BP 118/80 | Ht 63.0 in | Wt 178.0 lb

## 2015-02-04 DIAGNOSIS — N760 Acute vaginitis: Secondary | ICD-10-CM | POA: Diagnosis not present

## 2015-02-04 LAB — WET PREP FOR TRICH, YEAST, CLUE
CLUE CELLS WET PREP: NONE SEEN
Trich, Wet Prep: NONE SEEN
YEAST WET PREP: NONE SEEN

## 2015-02-04 LAB — URINALYSIS W MICROSCOPIC + REFLEX CULTURE
BILIRUBIN URINE: NEGATIVE
Crystals: NONE SEEN
Glucose, UA: NEGATIVE mg/dL
Hgb urine dipstick: NEGATIVE
Ketones, ur: NEGATIVE mg/dL
NITRITE: NEGATIVE
PH: 8.5 — AB (ref 5.0–8.0)
PROTEIN: NEGATIVE mg/dL
RBC / HPF: NONE SEEN RBC/hpf (ref <3)
Specific Gravity, Urine: 1.015 (ref 1.005–1.030)
Urobilinogen, UA: 0.2 mg/dL (ref 0.0–1.0)

## 2015-02-04 MED ORDER — TERCONAZOLE 0.4 % VA CREA: TOPICAL_CREAM | VAGINAL | Status: DC

## 2015-02-04 NOTE — Progress Notes (Signed)
Sarah Mcguire 01-15-1941 [id number]        74 y.o.  G2P2002 presents complaining of vaginal irritation and some burning with urination. Similar complaints several weeks ago. It was felt that secondary to wiping trauma although her wet prep did show some yeast and we treated her with Terazol. Her symptoms totally resolved seem to have recurred noting she did rub heavily with a washcloth the other night. No vaginal bleeding. Some burning with urination. No frequency dysuria urgency. No fever or chills.  Past medical history,surgical history, problem list, medications, allergies, family history and social history were all reviewed and documented in the EPIC chart.  Directed ROS with pertinent positives and negatives documented in the history of present illness/assessment and plan.  Exam: Wandra Scot assistant Filed Vitals:   02/04/15 1012  BP: 118/80  Height: 5\' 3"  (1.6 m)  Weight: 178 lb (80.74 kg)   General appearance:  Normal External BUS vagina with atrophic changes. Slight irritation at the posterior fourchette area no significant discharge.  Bimanual without masses or tenderness  Assessment/Plan:  74 y.o. [id number] with above symptoms and exam. Urinalysis does show many bacteria rare epithelial cells. Will run culture. Her culture from May was noted to be negative. Wet prep is unremarkable. Reviewed with patient and her husband whether this is a recurrence of her yeast even though does not show up on her wet prep versus atrophic changes. The Terazol cream seems to help and again whether it's the carrier versus the actual medication. Regardless we'll go ahead and treat with Terazol 7 day cream and then a maintenance dose of several times weekly to see if we can't keep the symptoms from returning. If she tolerates this then we'll monitor expectantly. If she does not then she'll follow up for further evaluation. Will prescribe 2 boxes of the Terazol 7 day with 1 refill.    Anastasio Auerbach MD,  10:57 AM 02/04/2015

## 2015-02-04 NOTE — Addendum Note (Signed)
Addended by: Burnett Kanaris on: 02/04/2015 11:11 AM   Modules accepted: Orders

## 2015-02-04 NOTE — Telephone Encounter (Signed)
-----   Message from Anastasio Auerbach, MD sent at 02/04/2015 11:00 AM EDT ----- Call in to the patient's pharmacy 2 boxes of the Terazole 7 with one refill for 2 more boxes. Instructions are apply nightly 7 days then 3 times weekly

## 2015-02-04 NOTE — Patient Instructions (Signed)
Use the Terazol cream nightly for a week and then several times weekly over the next 2 months.

## 2015-02-04 NOTE — Telephone Encounter (Signed)
RX CALLED IN

## 2015-02-06 LAB — URINE CULTURE

## 2015-02-08 ENCOUNTER — Other Ambulatory Visit: Payer: Self-pay

## 2015-02-08 MED ORDER — DILTIAZEM HCL ER COATED BEADS 120 MG PO CP24: 120.0000 mg | ORAL_CAPSULE | Freq: Every day | ORAL | Status: DC

## 2015-02-11 ENCOUNTER — Telehealth: Payer: Self-pay

## 2015-02-11 ENCOUNTER — Telehealth: Payer: Self-pay | Admitting: Gynecology

## 2015-02-11 ENCOUNTER — Ambulatory Visit: Payer: Medicare Other | Admitting: Gynecology

## 2015-02-11 MED ORDER — ESTRADIOL 0.1 MG/GM VA CREA: TOPICAL_CREAM | VAGINAL | Status: DC

## 2015-02-11 NOTE — Telephone Encounter (Signed)
Patient called today stating that she is seeing no improvement as far as her vaginitis symptoms. She has been using the Terazol cream as noted in the 02/04/2015 note and was given an appointment to come in.  I do not feel that I need to see her today as I doubt there is a dramatic change in her exam which was atrophic with slight irritation. Talk to the patient and discuss with her that I think there are 2 other options if the Terazol cream is not working:  1. We already talked about one which was adding some estrogen cream externally.  This would help to strengthen the tissues and hopefully resolve her symptoms recognizing that it will take several weeks to see a complete response. The issue with this as we did discuss with her husband present if she would absorb a significant amount she does have a history of blood clots and estrogen does increase the risk of blood clots. Having said that she is on Coumadin anticoagulation and it really should not have that effect given that she is anticoagulated already. She would have to accept that disclaimer though that it could increase her risk of blood clots.  If interested then Estrace vaginal cream one tube apply externally nightly 1 week and then twice weekly, refill 2 2. Second option would be to try Temovate 0.05% cream which is a high-dose steroid which would help to decrease inflammation and may give her relief of her symptoms. She will apply this nightly and stay on this for several weeks and then wean herself off such as every other night then every third night and then use it as needed after that for short bursts of irritation. Should not be used every night for months because it can cause thinning of the skin. If interested in this then dispense one tube, apply nightly, refill 1

## 2015-02-11 NOTE — Telephone Encounter (Signed)
All correct answers

## 2015-02-11 NOTE — Telephone Encounter (Signed)
Husband called with questions"  #1 Asked how they are going to measure 0.1mg  of Estrace cream. I told him she does not have to measure. To just put a little on her finger and apply lightly to affected area. If she needs more apply a little more until area lightly covered.   #2  He asked if they should have one box or two boxes of cream. I told him Dr. Loetta Rough order 42.5 grams and I was not sure how many boxes that would require. He said the one box is 42.5 grams. Therefore, the one box he receive is correct.  #3 She has some Terazol vag cream left. Ok to use that with the Estrace cream?  I told him since that was vaginal and Estrace external I thought it would be ok.  If all those answers are okay I will not need to call him back.

## 2015-02-11 NOTE — Telephone Encounter (Signed)
Patient informed she would like to try the estrogen cream this was sent. Appointment canceled.

## 2015-02-15 DIAGNOSIS — H52221 Regular astigmatism, right eye: Secondary | ICD-10-CM | POA: Diagnosis not present

## 2015-02-15 DIAGNOSIS — H04123 Dry eye syndrome of bilateral lacrimal glands: Secondary | ICD-10-CM | POA: Diagnosis not present

## 2015-02-15 LAB — HM DIABETES EYE EXAM

## 2015-02-18 ENCOUNTER — Encounter: Payer: Self-pay | Admitting: General Practice

## 2015-02-21 ENCOUNTER — Ambulatory Visit: Payer: Medicare Other

## 2015-02-21 ENCOUNTER — Telehealth: Payer: Self-pay | Admitting: *Deleted

## 2015-02-21 ENCOUNTER — Ambulatory Visit (INDEPENDENT_AMBULATORY_CARE_PROVIDER_SITE_OTHER): Payer: Medicare Other | Admitting: General Practice

## 2015-02-21 DIAGNOSIS — Z5181 Encounter for therapeutic drug level monitoring: Secondary | ICD-10-CM | POA: Diagnosis not present

## 2015-02-21 DIAGNOSIS — I82409 Acute embolism and thrombosis of unspecified deep veins of unspecified lower extremity: Secondary | ICD-10-CM

## 2015-02-21 LAB — POCT INR: INR: 2.4

## 2015-02-21 NOTE — Telephone Encounter (Signed)
Pt was prescribed Estrace vaginal cream one tube apply externally nightly 1 week and then twice weekly on 02/11/15. States she has one area outside her vagina that is still itchy states area feels scaly doesn't see a bump states it has always been there. Asked if she should continue with estrace cream or try the temovate Rx that I spoke with her about on 02/11/15 telephone encounter? Please advise

## 2015-02-21 NOTE — Progress Notes (Signed)
Pre visit review using our clinic review tool, if applicable. No additional management support is needed unless otherwise documented below in the visit note. 

## 2015-02-21 NOTE — Telephone Encounter (Signed)
Pt informed with the below note. 

## 2015-02-21 NOTE — Telephone Encounter (Signed)
I would stay with the Estrace cream for now. If it does not improve over the next 2 weeks or so and let me know and we can consider the alternative.

## 2015-03-03 ENCOUNTER — Ambulatory Visit (INDEPENDENT_AMBULATORY_CARE_PROVIDER_SITE_OTHER): Payer: Medicare Other | Admitting: Family Medicine

## 2015-03-03 ENCOUNTER — Encounter: Payer: Self-pay | Admitting: Family Medicine

## 2015-03-03 VITALS — BP 130/84 | HR 94 | Temp 97.9°F | Resp 17 | Wt 180.2 lb

## 2015-03-03 DIAGNOSIS — R82998 Other abnormal findings in urine: Secondary | ICD-10-CM

## 2015-03-03 DIAGNOSIS — R1084 Generalized abdominal pain: Secondary | ICD-10-CM

## 2015-03-03 DIAGNOSIS — K8020 Calculus of gallbladder without cholecystitis without obstruction: Secondary | ICD-10-CM | POA: Diagnosis not present

## 2015-03-03 DIAGNOSIS — R809 Proteinuria, unspecified: Secondary | ICD-10-CM | POA: Diagnosis not present

## 2015-03-03 DIAGNOSIS — R109 Unspecified abdominal pain: Secondary | ICD-10-CM

## 2015-03-03 DIAGNOSIS — N390 Urinary tract infection, site not specified: Secondary | ICD-10-CM | POA: Diagnosis not present

## 2015-03-03 DIAGNOSIS — R1011 Right upper quadrant pain: Secondary | ICD-10-CM | POA: Diagnosis not present

## 2015-03-03 LAB — POCT URINALYSIS DIPSTICK
BILIRUBIN UA: NEGATIVE
Glucose, UA: NEGATIVE
Ketones, UA: NEGATIVE
NITRITE UA: NEGATIVE
PH UA: 7
RBC UA: NEGATIVE
SPEC GRAV UA: 1.015
Urobilinogen, UA: NEGATIVE

## 2015-03-03 MED ORDER — HYDROCODONE-ACETAMINOPHEN 5-325 MG PO TABS: 1.0000 | ORAL_TABLET | ORAL | Status: DC | PRN

## 2015-03-03 NOTE — Assessment & Plan Note (Signed)
New.  Location over RLQ is concerning but given that she has had the pain intermittently for 'at least 2 weeks' makes appendicitis less likely.  Intermittent RUQ pain, particularly after meals is more consistent w/ biliary colic.  No fever, N/V/D.  No change in appetite.  Check labs to r/o infxn.  Get [place] to assess biliary tract and attempt to visualize appendix.  If [place] is unrevealing but pain persists, will need CT scan.  Reviewed supportive care and red flags that should prompt return.  Pt expressed understanding and is in agreement w/ plan.

## 2015-03-03 NOTE — Patient Instructions (Signed)
We'll determine the next steps based on your lab and ultrasound results Take the hydrocodone as needed for pain If your symptoms change or worsen, please call or if over the holiday weekend, please go to the ER Drink plenty of fluids REST! Call with any questions or concerns Hang in there!

## 2015-03-03 NOTE — Progress Notes (Signed)
   Subjective:    Patient ID: Sarah Mcguire, female    DOB: 1941-04-27, 74 y.o.   MRN: [id number]  HPI abd pain- RUQ pain, sxs started 'a couple of weeks' ago.  Pain is intermittent but recently has been more frequent.  Pain occurs w/ food- particularly after dinner.  No radiation of pain to back or shoulder.  Will radiate into R lower quadrant.  No N/V/D.  No fevers.  Pain resolves spontaneously. No known sick contacts.  No suprapubic pain or pressure.  No urinary frequency, urgency, dysuria.   Review of Systems For ROS see HPI     Objective:   Physical Exam  Constitutional: She is oriented to person, place, and time. She appears well-developed and well-nourished.  HENT:  Head: Normocephalic and atraumatic.  Eyes: Conjunctivae and EOM are normal. Pupils are equal, round, and reactive to light.  Pulmonary/Chest: Effort normal and breath sounds normal. No respiratory distress. She has no wheezes. She has no rales.  Abdominal: Soft. She exhibits no distension. There is tenderness (mild TTP over RLQ). There is no rebound and no guarding.  Hyperactive BS  Lymphadenopathy:    She has no cervical adenopathy.  Neurological: She is alert and oriented to person, place, and time.  Skin: Skin is warm and dry.  Psychiatric:  Very anxious  Vitals reviewed.         Assessment & Plan:

## 2015-03-03 NOTE — Progress Notes (Signed)
Pre visit review using our clinic review tool, if applicable. No additional management support is needed unless otherwise documented below in the visit note. 

## 2015-03-04 ENCOUNTER — Other Ambulatory Visit (INDEPENDENT_AMBULATORY_CARE_PROVIDER_SITE_OTHER): Payer: Medicare Other

## 2015-03-04 DIAGNOSIS — R1011 Right upper quadrant pain: Secondary | ICD-10-CM | POA: Diagnosis not present

## 2015-03-04 LAB — HEPATIC FUNCTION PANEL
ALK PHOS: 43 U/L (ref 39–117)
ALT: 23 U/L (ref 0–35)
AST: 26 U/L (ref 0–37)
Albumin: 4.2 g/dL (ref 3.5–5.2)
Bilirubin, Direct: 0.1 mg/dL (ref 0.0–0.3)
TOTAL PROTEIN: 7 g/dL (ref 6.0–8.3)
Total Bilirubin: 0.3 mg/dL (ref 0.2–1.2)

## 2015-03-04 LAB — CBC WITH DIFFERENTIAL/PLATELET
BASOS PCT: 1.4 % (ref 0.0–3.0)
Basophils Absolute: 0.1 10*3/uL (ref 0.0–0.1)
EOS ABS: 0.1 10*3/uL (ref 0.0–0.7)
EOS PCT: 1.2 % (ref 0.0–5.0)
HEMATOCRIT: 45.4 % (ref 36.0–46.0)
HEMOGLOBIN: 14.8 g/dL (ref 12.0–15.0)
LYMPHS PCT: 28.9 % (ref 12.0–46.0)
Lymphs Abs: 2.3 10*3/uL (ref 0.7–4.0)
MCHC: 32.5 g/dL (ref 30.0–36.0)
MCV: 88.3 fl (ref 78.0–100.0)
MONO ABS: 0.6 10*3/uL (ref 0.1–1.0)
Monocytes Relative: 7.1 % (ref 3.0–12.0)
NEUTROS ABS: 4.8 10*3/uL (ref 1.4–7.7)
Neutrophils Relative %: 61.4 % (ref 43.0–77.0)
Platelets: 246 10*3/uL (ref 150.0–400.0)
RBC: 5.15 Mil/uL — AB (ref 3.87–5.11)
RDW: 15.6 % — ABNORMAL HIGH (ref 11.5–15.5)
WBC: 7.8 10*3/uL (ref 4.0–10.5)

## 2015-03-04 LAB — BASIC METABOLIC PANEL
BUN: 23 mg/dL (ref 6–23)
CALCIUM: 10.1 mg/dL (ref 8.4–10.5)
CO2: 28 mEq/L (ref 19–32)
Chloride: 103 mEq/L (ref 96–112)
Creatinine, Ser: 0.76 mg/dL (ref 0.40–1.20)
GFR: 78.87 mL/min (ref >60.00)
GLUCOSE: 93 mg/dL (ref 70–99)
POTASSIUM: 4.3 meq/L (ref 3.5–5.1)
Sodium: 142 mEq/L (ref 135–145)

## 2015-03-04 LAB — URINE CULTURE: Colony Count: 30000

## 2015-03-09 ENCOUNTER — Other Ambulatory Visit: Payer: Self-pay | Admitting: Family Medicine

## 2015-03-09 ENCOUNTER — Telehealth: Payer: Self-pay | Admitting: Family Medicine

## 2015-03-09 ENCOUNTER — Encounter: Payer: Self-pay | Admitting: Gynecology

## 2015-03-09 ENCOUNTER — Encounter: Payer: Self-pay | Admitting: Family Medicine

## 2015-03-09 ENCOUNTER — Ambulatory Visit (INDEPENDENT_AMBULATORY_CARE_PROVIDER_SITE_OTHER): Payer: Medicare Other | Admitting: Gynecology

## 2015-03-09 VITALS — BP 124/78

## 2015-03-09 DIAGNOSIS — N762 Acute vulvitis: Secondary | ICD-10-CM | POA: Diagnosis not present

## 2015-03-09 DIAGNOSIS — L292 Pruritus vulvae: Secondary | ICD-10-CM

## 2015-03-09 DIAGNOSIS — N905 Atrophy of vulva: Secondary | ICD-10-CM | POA: Diagnosis not present

## 2015-03-09 LAB — WET PREP FOR TRICH, YEAST, CLUE
Clue Cells Wet Prep HPF POC: NONE SEEN
Trich, Wet Prep: NONE SEEN

## 2015-03-09 MED ORDER — LIDOCAINE 5 % EX OINT: 1.0000 "application " | TOPICAL_OINTMENT | Freq: Three times a day (TID) | CUTANEOUS | Status: DC | PRN

## 2015-03-09 NOTE — Patient Instructions (Signed)
Apply the lidocaine ointment to the itchy areas up to 3 times daily as needed. Office will call you with the biopsy results.

## 2015-03-09 NOTE — Progress Notes (Signed)
Sarah Mcguire 11-03-1940 [id number]        74 y.o.  G2P2002 Persistent vaginal itching. She now feels a specific area on the right side that is really itchy. Has been using Estrace vaginal cream of the last month without success as far as itching is concerned. She remembers trying systemic estrogen in the past and being told that she was "allergic to it".  Has been intermittently using Terazol vaginal cream which sometimes helps but other times does not. Also has applied Diprolene 0.05% cream in the past.  Past medical history,surgical history, problem list, medications, allergies, family history and social history were all reviewed and documented in the EPIC chart.  Directed ROS with pertinent positives and negatives documented in the history of present illness/assessment and plan.  Exam: Kim assistant Filed Vitals:   03/09/15 1423  BP: 124/78   General appearance:  Normal External BUS vagina with atrophic changes.  Patch area of erythema right lateral posterior fourchette is the area she's point to his being the most itchy. Is a much smaller subtle area on the other side. Slight white discharge noted.  Bimanual without masses or tenderness. Physical Exam  Genitourinary:        Procedure: The skin overlying the patch of erythematous area right posterior fourchette was cleansed with Betadine, infiltrated with 1% lidocaine and a representative biopsy was taken in the area of her most pruritic patch. Silver nitrate applied afterwards. Patient tolerated well.  Assessment/Plan:  [name] with persistent vulvar pruritus now concentrated at the right posterior fourchette. A wet prep did show few yeast but I think her primary issue is with a chronic scratch itch area. I took a biopsy to rule out other pathology but if it's primarily inflammatory will recommend Temovate 0.05% cream. I did prescribed lidocaine 5% ointment today to apply to hopefully give her some symptomatic relief. Patient  will follow up in several days for the biopsy results. Will not treat the yeast at this point as she has been using Terazol cream fairly chronically and it doesn't seem to help. Reluctant to use systemic antifungal due to her Coumadin.    Anastasio Auerbach MD, 3:40 PM 03/09/2015

## 2015-03-09 NOTE — Telephone Encounter (Signed)
Spoke with patient's husband and notified him to have wife continue coumadin as normal per Dr. Birdie Riddle.  He stated understanding.

## 2015-03-09 NOTE — Telephone Encounter (Signed)
Med filled and will be faxed after lunch.

## 2015-03-09 NOTE — Telephone Encounter (Signed)
Last OV 03-03-15 Alprazolam last filled 01-12-15 #90 with 1

## 2015-03-09 NOTE — Addendum Note (Signed)
Addended by: Nelva Nay on: 03/09/2015 04:16 PM   Modules accepted: Orders

## 2015-03-09 NOTE — Telephone Encounter (Signed)
Caller name: Tye,David Relation to pt: spouse  Call back number: [phone]   Reason for call:  As per spouse pt was seen by gyn due to vaginal bleeding. Spouse would like to know should patient stop taking warfarin (COUMADIN) 5 MG tablet for tonight. Please advise

## 2015-03-11 ENCOUNTER — Telehealth: Payer: Self-pay | Admitting: *Deleted

## 2015-03-11 NOTE — Telephone Encounter (Signed)
Pt husband Shanon Brow called to get Bx result from [place] 03/09/15. I informed pt husband that results is not back.

## 2015-03-14 ENCOUNTER — Other Ambulatory Visit: Payer: Self-pay | Admitting: Gynecology

## 2015-03-14 MED ORDER — CLOBETASOL PROP EMOLLIENT BASE 0.05 % EX CREA: TOPICAL_CREAM | CUTANEOUS | Status: DC

## 2015-03-18 ENCOUNTER — Encounter: Payer: Self-pay | Admitting: Family Medicine

## 2015-03-18 ENCOUNTER — Encounter: Payer: Self-pay | Admitting: General Practice

## 2015-03-18 ENCOUNTER — Ambulatory Visit (INDEPENDENT_AMBULATORY_CARE_PROVIDER_SITE_OTHER): Payer: Medicare Other | Admitting: Family Medicine

## 2015-03-18 VITALS — BP 122/76 | HR 93 | Temp 98.0°F | Resp 17 | Ht 63.0 in | Wt 179.1 lb

## 2015-03-18 DIAGNOSIS — E1140 Type 2 diabetes mellitus with diabetic neuropathy, unspecified: Secondary | ICD-10-CM | POA: Diagnosis not present

## 2015-03-18 LAB — BASIC METABOLIC PANEL
BUN: 21 mg/dL (ref 6–23)
CO2: 33 mEq/L — ABNORMAL HIGH (ref 19–32)
Calcium: 10.1 mg/dL (ref 8.4–10.5)
Chloride: 102 mEq/L (ref 96–112)
Creatinine, Ser: 0.8 mg/dL (ref 0.40–1.20)
GFR: 74.33 mL/min (ref >60.00)
Glucose, Bld: 109 mg/dL — ABNORMAL HIGH (ref 70–99)
POTASSIUM: 4.1 meq/L (ref 3.5–5.1)
Sodium: 140 mEq/L (ref 135–145)

## 2015-03-18 LAB — HEMOGLOBIN A1C: HEMOGLOBIN A1C: 6.6 % — AB (ref 4.6–6.5)

## 2015-03-18 NOTE — Progress Notes (Signed)
   Subjective:    Patient ID: Sarah Mcguire, female    DOB: [birth year], 74 y.o.   MRN: [id number]  HPI DM- chronic problem, UTD on foot and eye exam.  On Lisinopril for renal protection.  On Metformin and cinnamon.  CBGs ranging 90-150.  Pt reports she starts to have symptoms when sugars get to 100.  No CP, SOB, HAs, visual changes, edema above baseline.     Review of Systems For ROS see HPI    Objective:   Physical Exam  Constitutional: She is oriented to person, place, and time. She appears well-developed and well-nourished. No distress.  HENT:  Head: Normocephalic and atraumatic.  Eyes: Conjunctivae and EOM are normal. Pupils are equal, round, and reactive to light.  Neck: Normal range of motion. Neck supple. No thyromegaly present.  Cardiovascular: Normal rate, regular rhythm, normal heart sounds and intact distal pulses.   No murmur heard. Pulmonary/Chest: Effort normal and breath sounds normal. No respiratory distress.  Abdominal: Soft. She exhibits no distension. There is no tenderness.  Musculoskeletal: She exhibits no edema.  Lymphadenopathy:    She has no cervical adenopathy.  Neurological: She is alert and oriented to person, place, and time.  Skin: Skin is warm and dry.  Psychiatric: She has a normal mood and affect. Her behavior is normal.  Vitals reviewed.         Assessment & Plan:

## 2015-03-18 NOTE — Patient Instructions (Signed)
Schedule your complete physical in 3-4 months (needs AM appt) We'll notify you of your lab results and make any changes if needed Keep up the good work!  You look great! Call with any questions or concerns Happy Early Birthday!!!

## 2015-03-18 NOTE — Progress Notes (Signed)
Pre visit review using our clinic review tool, if applicable. No additional management support is needed unless otherwise documented below in the visit note. 

## 2015-03-19 NOTE — Assessment & Plan Note (Signed)
Chronic problem.  Pt reports home CBGs are better than previous.  Still taking Metformin and Cinnamon.  UTD on eye exam, foot exam.  On ACE for renal protection.  Check labs.  Adjust meds prn

## 2015-03-21 ENCOUNTER — Ambulatory Visit (INDEPENDENT_AMBULATORY_CARE_PROVIDER_SITE_OTHER): Payer: Medicare Other | Admitting: General Practice

## 2015-03-21 DIAGNOSIS — Z5181 Encounter for therapeutic drug level monitoring: Secondary | ICD-10-CM | POA: Diagnosis not present

## 2015-03-21 LAB — POCT INR: INR: 2.5

## 2015-03-21 NOTE — Progress Notes (Signed)
Pre visit review using our clinic review tool, if applicable. No additional management support is needed unless otherwise documented below in the visit note. 

## 2015-03-25 DIAGNOSIS — M791 Myalgia: Secondary | ICD-10-CM | POA: Diagnosis not present

## 2015-03-31 ENCOUNTER — Encounter: Payer: Medicare Other | Admitting: Family Medicine

## 2015-04-04 ENCOUNTER — Encounter (HOSPITAL_COMMUNITY): Payer: Self-pay

## 2015-04-04 ENCOUNTER — Emergency Department (HOSPITAL_COMMUNITY)
Admission: EM | Admit: 2015-04-04 | Discharge: 2015-04-04 | Disposition: A | Discharge status: Home / Self Care | Payer: Medicare Other | Attending: Emergency Medicine | Admitting: Emergency Medicine

## 2015-04-04 ENCOUNTER — Other Ambulatory Visit: Payer: Self-pay | Admitting: Family Medicine

## 2015-04-04 ENCOUNTER — Emergency Department (HOSPITAL_COMMUNITY): Payer: Medicare Other

## 2015-04-04 DIAGNOSIS — F419 Anxiety disorder, unspecified: Secondary | ICD-10-CM | POA: Insufficient documentation

## 2015-04-04 DIAGNOSIS — M1990 Unspecified osteoarthritis, unspecified site: Secondary | ICD-10-CM | POA: Insufficient documentation

## 2015-04-04 DIAGNOSIS — E785 Hyperlipidemia, unspecified: Secondary | ICD-10-CM | POA: Insufficient documentation

## 2015-04-04 DIAGNOSIS — Z720 Tobacco use: Secondary | ICD-10-CM | POA: Diagnosis not present

## 2015-04-04 DIAGNOSIS — R079 Chest pain, unspecified: Secondary | ICD-10-CM | POA: Diagnosis not present

## 2015-04-04 DIAGNOSIS — I4891 Unspecified atrial fibrillation: Secondary | ICD-10-CM | POA: Diagnosis not present

## 2015-04-04 DIAGNOSIS — Z86718 Personal history of other venous thrombosis and embolism: Secondary | ICD-10-CM | POA: Insufficient documentation

## 2015-04-04 DIAGNOSIS — J449 Chronic obstructive pulmonary disease, unspecified: Secondary | ICD-10-CM | POA: Diagnosis not present

## 2015-04-04 DIAGNOSIS — Z8669 Personal history of other diseases of the nervous system and sense organs: Secondary | ICD-10-CM | POA: Insufficient documentation

## 2015-04-04 DIAGNOSIS — Z880 Allergy status to penicillin: Secondary | ICD-10-CM | POA: Diagnosis not present

## 2015-04-04 DIAGNOSIS — D649 Anemia, unspecified: Secondary | ICD-10-CM | POA: Diagnosis not present

## 2015-04-04 DIAGNOSIS — Z7901 Long term (current) use of anticoagulants: Secondary | ICD-10-CM | POA: Diagnosis not present

## 2015-04-04 DIAGNOSIS — Z79899 Other long term (current) drug therapy: Secondary | ICD-10-CM | POA: Insufficient documentation

## 2015-04-04 DIAGNOSIS — I10 Essential (primary) hypertension: Secondary | ICD-10-CM | POA: Insufficient documentation

## 2015-04-04 DIAGNOSIS — R0789 Other chest pain: Secondary | ICD-10-CM | POA: Diagnosis not present

## 2015-04-04 DIAGNOSIS — K219 Gastro-esophageal reflux disease without esophagitis: Secondary | ICD-10-CM | POA: Insufficient documentation

## 2015-04-04 DIAGNOSIS — Z86711 Personal history of pulmonary embolism: Secondary | ICD-10-CM | POA: Diagnosis not present

## 2015-04-04 LAB — URINALYSIS, ROUTINE W REFLEX MICROSCOPIC
Bilirubin Urine: NEGATIVE
Glucose, UA: NEGATIVE mg/dL
Hgb urine dipstick: NEGATIVE
Ketones, ur: NEGATIVE mg/dL
Nitrite: NEGATIVE
Protein, ur: NEGATIVE mg/dL
Specific Gravity, Urine: 1.014 (ref 1.005–1.030)
Urobilinogen, UA: 0.2 mg/dL (ref 0.0–1.0)
pH: 8 (ref 5.0–8.0)

## 2015-04-04 LAB — COMPREHENSIVE METABOLIC PANEL
ALT: 20 U/L (ref 14–54)
AST: 24 U/L (ref 15–41)
Albumin: 3.6 g/dL (ref 3.5–5.0)
Alkaline Phosphatase: 40 U/L (ref 38–126)
Anion gap: 7 (ref 5–15)
BUN: 28 mg/dL — ABNORMAL HIGH (ref 6–20)
CO2: 33 mmol/L — ABNORMAL HIGH (ref 22–32)
Calcium: 11.2 mg/dL — ABNORMAL HIGH (ref 8.9–10.3)
Chloride: 103 mmol/L (ref 101–111)
Creatinine, Ser: 1.03 mg/dL — ABNORMAL HIGH (ref 0.44–1.00)
GFR calc Af Amer: >60 mL/min (ref >60)
GFR calc non Af Amer: 52 mL/min — ABNORMAL LOW (ref >60)
Glucose, Bld: 120 mg/dL — ABNORMAL HIGH (ref 65–99)
Potassium: 4 mmol/L (ref 3.5–5.1)
Sodium: 143 mmol/L (ref 135–145)
Total Bilirubin: 0.5 mg/dL (ref 0.3–1.2)
Total Protein: 6.6 g/dL (ref 6.5–8.1)

## 2015-04-04 LAB — CBC WITH DIFFERENTIAL/PLATELET
Basophils Absolute: 0 10*3/uL (ref 0.0–0.1)
Basophils Relative: 0 % (ref 0–1)
Eosinophils Absolute: 0.1 10*3/uL (ref 0.0–0.7)
Eosinophils Relative: 2 % (ref 0–5)
HCT: 47.9 % — ABNORMAL HIGH (ref 36.0–46.0)
Hemoglobin: 15.2 g/dL — ABNORMAL HIGH (ref 12.0–15.0)
Lymphocytes Relative: 31 % (ref 12–46)
Lymphs Abs: 2.4 10*3/uL (ref 0.7–4.0)
MCH: 28.6 pg (ref 26.0–34.0)
MCHC: 31.7 g/dL (ref 30.0–36.0)
MCV: 90 fL (ref 78.0–100.0)
Monocytes Absolute: 0.7 10*3/uL (ref 0.1–1.0)
Monocytes Relative: 9 % (ref 3–12)
Neutro Abs: 4.6 10*3/uL (ref 1.7–7.7)
Neutrophils Relative %: 58 % (ref 43–77)
Platelets: 211 10*3/uL (ref 150–400)
RBC: 5.32 MIL/uL — ABNORMAL HIGH (ref 3.87–5.11)
RDW: 14.5 % (ref 11.5–15.5)
WBC: 7.9 10*3/uL (ref 4.0–10.5)

## 2015-04-04 LAB — URINE MICROSCOPIC-ADD ON

## 2015-04-04 LAB — TROPONIN I: Troponin I: <0.03 ng/mL (ref <0.031)

## 2015-04-04 LAB — PROTIME-INR
INR: 2.68 — ABNORMAL HIGH (ref 0.00–1.49)
Prothrombin Time: 28.1 seconds — ABNORMAL HIGH (ref 11.6–15.2)

## 2015-04-04 NOTE — ED Notes (Signed)
Arrived per ems c/o 2 day hx cp relieved with tums denies pain at this time. Per ems pt admitted to anxiety pt states resolved at this time. Afib noted on monitor

## 2015-04-04 NOTE — ED Notes (Signed)
Pt alert x4 respirations easy non labored.  

## 2015-04-04 NOTE — ED Provider Notes (Signed)
CSN: [id number]     Arrival date & time 04/04/15  1124 History   First MD Initiated Contact with Patient 04/04/15 1136     Chief Complaint  Patient presents with  . Chest Pain     (Consider location/radiation/quality/duration/timing/severity/associated sxs/prior Treatment) HPI   Patient is 74 year old Caucasian female with PMHx of HLD, HTN, DM, GERD, anxiety, DVT, PE, and A fib who presents with intermittent chest pain at home for the last month.  Patient has a history of GERD for which she takes Tums.  She feels that Tums provides complete relief for her chest pain; including her most recent episode of chest discomfort last night. She reports she has taken Nexium in the past, but is not taking it currently. She admits to increased anxiety lately. She has had recent decline in health including decreased mobility at home despite recent physical therapy. She denies current chest pain, shortness of breath, palpitations, dyspnea on exertion, orthopnea, or abdominal pain, nausea, vomiting,  lightheadedness, near syncope or syncope  Past Medical History  Diagnosis Date  . Hyperlipidemia   . Hypertension   . Hyperglycemia   . GERD (gastroesophageal reflux disease)   . Anemia   . Leukocytosis   . Diverticulitis   . Neuropathy   . Anxiety     SEVERE  . 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 DVT (deep venous thrombosis)   . Diabetes mellitus     borderline no medications  . Arthritis   . COPD (chronic obstructive pulmonary disease)   . Atrial fibrillation with RVR 11/28/2012   Past Surgical History  Procedure Laterality Date  . Tonsillectomy    . Tympanostomy tube placement      R ear  . 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 Diabetes Mother   . Hypertension Mother   . Heart disease Father     MI age 55's   . Diabetes Brother   . Hypertension Brother    History  Substance Use Topics  . Smoking status: Current Every Day Smoker -- 0.80 packs/day    Types: Cigarettes  . Smokeless tobacco: Never Used  . Alcohol Use: No   OB History    Gravida Para Term Preterm AB TAB SAB Ectopic Multiple Living   2 2 2       2      Review of Systems All other systems negative except as documented in the HPI. All pertinent positives and negatives as reviewed in the HPI.  Allergies  Morphine and related; Amoxicillin; Amoxicillin-pot clavulanate; Citalopram hydrobromide; Clonazepam; Cymbalta; Macrobid; Penicillins; Pyridium; Zonegran; Zonisamide; Sertraline hcl; and Wellbutrin  Home Medications   Prior to Admission medications   Medication Sig Start Date End Date Taking? Authorizing Provider  acetaminophen (TYLENOL) 325 MG tablet Take 325 mg by mouth every 6 (six) hours as needed (for headache).    Historical Provider, MD  alprazolam Duanne Moron) 2 MG tablet TAKE 1/2- 1 TABLET BY MOUTH 3 TIMES A DAY AS NEEDED FOR ANXIETY OR SLEEP 03/09/15   Midge Minium, MD  Cholecalciferol (VITAMIN D) 2000 UNITS tablet Take 2,000 Units by mouth daily.    Historical Provider, MD  Clobetasol Prop Emollient Base 0.05 % emollient cream  S: apply twice daily for 1-2 weeks then nightly for 2 weeks and then taper off to stop.PRN afterwards for itching. 03/14/15   Anastasio Auerbach, MD  diltiazem (CARDIZEM CD) 120 MG 24 hr capsule Take 1 capsule (120 mg total) by mouth daily. 02/08/15   Josue Hector, MD  esomeprazole (NEXIUM) 40 MG capsule Take 40 mg by mouth daily as needed. For acid reflux. 07/09/11   Midge Minium, MD  fenofibrate 160 MG tablet TAKE 1 TABLET (160 MG TOTAL) BY MOUTH DAILY. 09/29/14   Midge Minium, MD  folic acid (FOLVITE) 1 MG tablet Take 1 tablet (1 mg total) by mouth daily. 05/31/14   Midge Minium, MD  furosemide (LASIX) 40 MG tablet Take 1 tablet (40 mg total) by mouth daily. Patient taking differently: Take 20 mg by mouth daily.  11/08/14   Midge Minium, MD  HYDROcodone-acetaminophen (NORCO/VICODIN) 5-325 MG per tablet Take 1-2 tablets by mouth every 4 (four) hours as needed. 03/03/15   Midge Minium, MD  hydrocortisone-pramoxine [facility]) 2.5-1 % rectal cream Place 1 application rectally 3 (three) times daily as needed. 11/02/13   Midge Minium, MD  levothyroxine (SYNTHROID, LEVOTHROID) 50 MCG tablet Take 1 tablet (50 mcg total) by mouth daily. 01/17/15   Midge Minium, MD  lidocaine (LIDODERM) 5 % Place 1 patch onto the skin daily. Remove & Discard patch within 12 hours or as directed by MD    Historical Provider, MD  lisinopril (PRINIVIL,ZESTRIL) 10 MG tablet Take 1 tablet (10 mg total) by mouth daily. 07/12/14   Midge Minium, MD  metFORMIN (GLUCOPHAGE-XR) 500 MG 24 hr tablet Take 2 tablets by mouth in the morning with food and 1 tablet in the evening with food. 12/20/14   Midge Minium, MD  Multiple Vitamin (MULTIVITAMIN) tablet Take 1 tablet by mouth daily.      Historical Provider, MD  nystatin (MYCOSTATIN/NYSTOP) 100000 UNIT/GM POWD Apply powder to the outside of the groin as needed for irritaion 08/30/14   Midge Minium, MD  ONE TOUCH ULTRA TEST test strip TEST TWICE A DAY AS DIRECTED 05/11/14   Midge Minium, MD  simvastatin (ZOCOR) 20 MG tablet Take 1 tablet (20 mg total) by mouth every evening. 12/14/14   Midge Minium, MD  warfarin (COUMADIN) 5 MG tablet TAKE 1 TABLET ON TUESDAY, THURSDAY, SATURDAY, AND SUNDAY. TAKE 1/2 TABLET BY MOUTH THE REMAINING DAYS. 11/08/14   Midge Minium, MD   BP 122/94 mmHg  Pulse 89  Temp(Src) 98.1 F (36.7 C) (Oral)  Resp 17  Wt 177 lb (80.287 kg)  SpO2 90%  LMP  (LMP Unknown) Physical Exam  Constitutional: She is oriented to person, place, and time. She appears well-developed and  well-nourished. No distress.  HENT:  Head: Normocephalic and atraumatic.  Mouth/Throat: Oropharynx is clear and moist.  Eyes: Pupils are equal, round, and reactive to light.  Neck: Normal range of motion. Neck supple.  Cardiovascular: Normal rate, regular rhythm and normal heart sounds.  Exam reveals no gallop and no friction rub.   No murmur heard. Pulmonary/Chest: Effort normal and breath sounds normal. No respiratory distress. She has no wheezes. She has no rales.  Abdominal: Soft. Bowel sounds are normal. She exhibits no distension. There is no tenderness.  Musculoskeletal: She exhibits no edema.  Neurological: She is alert and oriented to person, place, and time.  Skin: Skin is warm and dry. No rash noted. No erythema.  Psychiatric: She has a normal mood and affect. Her behavior is normal.  Nursing note and vitals reviewed.   ED Course  Procedures (including critical care time) Labs Review Labs Reviewed - No data to display  Imaging Review No results found.   EKG Interpretation   Date/Time:  Monday April 04 2015 11:26:34 EDT Ventricular Rate:  92 PR Interval:  200 QRS Duration: 94 QT Interval:  356 QTC Calculation: 440 R Axis:   38 Text Interpretation:  Sinus rhythm Atrial premature complex Borderline  repolarization abnormality Confirmed by DELO  MD, DOUGLAS ([id number]) on  04/04/2015 11:40:58 AM      Patient was very insistent on not being admitted to the hospital, states that she wants to go home.  The patient voices multiple times.  I do not feel that she necessarily needed admission, as is the ongoing over the last couple of months.  Patient states that the pain lasts maybe a few minutes at a time, but she is unsure.  She continued to ask me when she can go home.  Patient states that she does not feel she needs to be in the emergency room any further.  She declines any further testing    Dalia Heading, PA-C 04/04/15 1937  Veryl Speak, MD 04/07/15 [phone]

## 2015-04-04 NOTE — Discharge Instructions (Signed)
Return here as needed. Follow up with the cardiologist provided.  °

## 2015-04-04 NOTE — ED Notes (Signed)
Food given. Pt return from xray

## 2015-04-04 NOTE — ED Notes (Signed)
Patient transported to X-ray 

## 2015-04-04 NOTE — ED Notes (Signed)
Pt alertx4 respirations easy non labored. nad noted

## 2015-04-05 NOTE — Telephone Encounter (Signed)
Med filled.  

## 2015-04-06 ENCOUNTER — Telehealth: Payer: Self-pay | Admitting: Family Medicine

## 2015-04-06 MED ORDER — HYDROCODONE-ACETAMINOPHEN 5-325 MG PO TABS: 1.0000 | ORAL_TABLET | ORAL | Status: DC | PRN

## 2015-04-06 NOTE — Telephone Encounter (Signed)
Ok for 90

## 2015-04-06 NOTE — Telephone Encounter (Signed)
Caller name: Tikia Skilton Relationship to patient: Husband Can be reached: [phone] Pharmacy:  Reason for call: Pt needs a Rx refill on her Hydrocodone.

## 2015-04-06 NOTE — Telephone Encounter (Signed)
Last OV 03/18/15 Hydrocodone last filled 03/03/15 #90 with 0

## 2015-04-06 NOTE — Telephone Encounter (Signed)
Med filled and pt notified available at the front desk.

## 2015-04-08 ENCOUNTER — Other Ambulatory Visit: Payer: Self-pay | Admitting: Physical Medicine and Rehabilitation

## 2015-04-08 ENCOUNTER — Ambulatory Visit
Admission: RE | Admit: 2015-04-08 | Discharge: 2015-04-08 | Disposition: A | Discharge status: Home / Self Care | Payer: Medicare Other | Source: Ambulatory Visit | Attending: Physical Medicine and Rehabilitation | Admitting: Physical Medicine and Rehabilitation

## 2015-04-08 DIAGNOSIS — M545 Low back pain: Secondary | ICD-10-CM

## 2015-04-08 DIAGNOSIS — K5730 Diverticulosis of large intestine without perforation or abscess without bleeding: Secondary | ICD-10-CM | POA: Diagnosis not present

## 2015-04-08 DIAGNOSIS — M4806 Spinal stenosis, lumbar region: Secondary | ICD-10-CM | POA: Diagnosis not present

## 2015-04-08 DIAGNOSIS — R102 Pelvic and perineal pain: Secondary | ICD-10-CM

## 2015-04-08 DIAGNOSIS — M160 Bilateral primary osteoarthritis of hip: Secondary | ICD-10-CM | POA: Diagnosis not present

## 2015-04-11 ENCOUNTER — Telehealth: Payer: Self-pay | Admitting: General Practice

## 2015-04-11 DIAGNOSIS — M4806 Spinal stenosis, lumbar region: Secondary | ICD-10-CM | POA: Diagnosis not present

## 2015-04-11 NOTE — Telephone Encounter (Signed)
-----   Message from Midge Minium, MD sent at 04/11/2015  4:06 PM EDT ----- Regarding: RE: Lovenox bridge Pt has hx of DVT, PE, and Afib- based on this, I would recommend a Lovenox bridge while holding coumadin for her injxn to avoid recurrence of clot (she's also very sedentary)  Thanks!! Dimple Nanas ----- Message -----    From: Warden Fillers, RN    Sent: 04/11/2015   4:00 PM      To: Midge Minium, MD Subject: Lovenox bridge                                 Hi Dr. Birdie Riddle,  Patient will be having spinal injection in the next couple of weeks and will need to stop coumadin for a few days.  Do you feel like she needs to be bridged with Lovenox?  Thanks, Villa Herb, RN

## 2015-04-12 ENCOUNTER — Other Ambulatory Visit: Payer: Self-pay | Admitting: General Practice

## 2015-04-12 ENCOUNTER — Telehealth: Payer: Self-pay | Admitting: General Practice

## 2015-04-12 ENCOUNTER — Ambulatory Visit (INDEPENDENT_AMBULATORY_CARE_PROVIDER_SITE_OTHER): Payer: Medicare Other | Admitting: General Practice

## 2015-04-12 DIAGNOSIS — Z5181 Encounter for therapeutic drug level monitoring: Secondary | ICD-10-CM

## 2015-04-12 DIAGNOSIS — I82409 Acute embolism and thrombosis of unspecified deep veins of unspecified lower extremity: Secondary | ICD-10-CM

## 2015-04-12 LAB — POCT INR: INR: 3.4

## 2015-04-12 MED ORDER — ENOXAPARIN SODIUM 120 MG/0.8ML ~~LOC~~ SOLN: 120.0000 mg | SUBCUTANEOUS | Status: DC

## 2015-04-12 NOTE — Patient Instructions (Addendum)
8/11 - Take last dose of coumadin 8/12- Nothing (No coumadin and No Lovenox) 8/13- Lovenox in the AM 8/14 - Lovenox in the AM 8/15 - Lovenox in the AM 8/16 - Lovenox in the AM 8/17 - Lovenox in the AM 8/18 - Procedure (DO NOT TAKE LOVENOX TODAY) 8/19 - Lovenox in the AM and 1 1/2 tablets of coumadin 8/20 - Lovenox in the AM and 1 1/2 tablets of coumadin 8/21 - Lovenox in the AM and 1 tablet of coumadin 8/22 - Lovenox in the AM and 1 tablet of coumadin 8/23 - Check INR in Clinic.

## 2015-04-12 NOTE — Progress Notes (Signed)
Pre visit review using our clinic review tool, if applicable. No additional management support is needed unless otherwise documented below in the visit note. 

## 2015-04-12 NOTE — Progress Notes (Signed)
I have reviewed and agree with the plan. 

## 2015-04-12 NOTE — Telephone Encounter (Signed)
Fax sent to [facility] [phone]) for clearance to hold patient's coumadin for spinal procedure on 8/18.

## 2015-04-19 ENCOUNTER — Ambulatory Visit: Payer: Medicare Other

## 2015-04-21 DIAGNOSIS — M4806 Spinal stenosis, lumbar region: Secondary | ICD-10-CM | POA: Diagnosis not present

## 2015-04-25 ENCOUNTER — Encounter: Payer: Self-pay | Admitting: General Practice

## 2015-04-26 ENCOUNTER — Ambulatory Visit (INDEPENDENT_AMBULATORY_CARE_PROVIDER_SITE_OTHER): Payer: Medicare Other | Admitting: General Practice

## 2015-04-26 DIAGNOSIS — I82409 Acute embolism and thrombosis of unspecified deep veins of unspecified lower extremity: Secondary | ICD-10-CM | POA: Diagnosis not present

## 2015-04-26 DIAGNOSIS — Z5181 Encounter for therapeutic drug level monitoring: Secondary | ICD-10-CM | POA: Diagnosis not present

## 2015-04-26 LAB — POCT INR: INR: 2

## 2015-04-26 NOTE — Progress Notes (Signed)
Pre visit review using our clinic review tool, if applicable. No additional management support is needed unless otherwise documented below in the visit note. 

## 2015-04-26 NOTE — Progress Notes (Signed)
I have reviewed and agree with the plan. 

## 2015-04-29 ENCOUNTER — Telehealth: Payer: Self-pay | Admitting: Family Medicine

## 2015-04-29 NOTE — Telephone Encounter (Signed)
Patient scheduled 05/03/15 for fatigue.

## 2015-04-29 NOTE — Telephone Encounter (Signed)
Provider thinks pt should be seen on Monday if fatigue worsens over the weekend, if not ok for same day.

## 2015-04-29 NOTE — Telephone Encounter (Signed)
Patient states this is her only concern.  No cramping, nausea, blurred vision, shortness of breath, palpitations.  No other symptoms- she is feeling weak/tired.    OK to schedule patient in Same Day?

## 2015-04-29 NOTE — Telephone Encounter (Signed)
Pt is having weakness and husband said he discussed with Dr. Birdie Riddle and was advised she may need to check electrolytes. Only available appts are same day til end of September. Husband did not want to see another provider. Where should I schedule her?

## 2015-04-29 NOTE — Telephone Encounter (Signed)
Pt's husband called back, please return the call

## 2015-04-29 NOTE — Telephone Encounter (Signed)
Can you please triage pt. Need to know if fatigue is the only concern?

## 2015-05-03 ENCOUNTER — Ambulatory Visit (INDEPENDENT_AMBULATORY_CARE_PROVIDER_SITE_OTHER): Payer: Medicare Other | Admitting: Family Medicine

## 2015-05-03 ENCOUNTER — Encounter: Payer: Self-pay | Admitting: Family Medicine

## 2015-05-03 ENCOUNTER — Other Ambulatory Visit: Payer: Self-pay | Admitting: General Practice

## 2015-05-03 VITALS — BP 132/84 | HR 86 | Temp 98.0°F | Resp 16 | Ht 63.0 in

## 2015-05-03 DIAGNOSIS — R5383 Other fatigue: Secondary | ICD-10-CM | POA: Diagnosis not present

## 2015-05-03 DIAGNOSIS — M545 Low back pain: Secondary | ICD-10-CM

## 2015-05-03 DIAGNOSIS — I4892 Unspecified atrial flutter: Secondary | ICD-10-CM

## 2015-05-03 LAB — CBC WITH DIFFERENTIAL/PLATELET
BASOS ABS: 0 10*3/uL (ref 0.0–0.1)
Basophils Relative: 0.3 % (ref 0.0–3.0)
EOS ABS: 0.1 10*3/uL (ref 0.0–0.7)
Eosinophils Relative: 0.7 % (ref 0.0–5.0)
HEMATOCRIT: 46 % (ref 36.0–46.0)
HEMOGLOBIN: 15 g/dL (ref 12.0–15.0)
LYMPHS PCT: 24.7 % (ref 12.0–46.0)
Lymphs Abs: 2.2 10*3/uL (ref 0.7–4.0)
MCHC: 32.5 g/dL (ref 30.0–36.0)
MCV: 86.4 fl (ref 78.0–100.0)
Monocytes Absolute: 0.6 10*3/uL (ref 0.1–1.0)
Monocytes Relative: 7 % (ref 3.0–12.0)
Neutro Abs: 6 10*3/uL (ref 1.4–7.7)
Neutrophils Relative %: 67.3 % (ref 43.0–77.0)
Platelets: 184 10*3/uL (ref 150.0–400.0)
RBC: 5.32 Mil/uL — ABNORMAL HIGH (ref 3.87–5.11)
RDW: 15 % (ref 11.5–15.5)
WBC: 9 10*3/uL (ref 4.0–10.5)

## 2015-05-03 LAB — BASIC METABOLIC PANEL
BUN: 24 mg/dL — ABNORMAL HIGH (ref 6–23)
CALCIUM: 10.8 mg/dL — AB (ref 8.4–10.5)
CO2: 35 mEq/L — ABNORMAL HIGH (ref 19–32)
Chloride: 104 mEq/L (ref 96–112)
Creatinine, Ser: 0.84 mg/dL (ref 0.40–1.20)
GFR: 70.24 mL/min (ref >60.00)
Glucose, Bld: 112 mg/dL — ABNORMAL HIGH (ref 70–99)
Potassium: 4 mEq/L (ref 3.5–5.1)
Sodium: 145 mEq/L (ref 135–145)

## 2015-05-03 LAB — TSH: TSH: 1.77 u[IU]/mL (ref 0.35–4.50)

## 2015-05-03 MED ORDER — WARFARIN SODIUM 5 MG PO TABS: ORAL_TABLET | ORAL | Status: DC

## 2015-05-03 MED ORDER — HYDROCODONE-ACETAMINOPHEN 5-325 MG PO TABS: 1.0000 | ORAL_TABLET | ORAL | Status: DC | PRN

## 2015-05-03 NOTE — Assessment & Plan Note (Signed)
EKG shows no recurrence of Afib/flutter so this is not the underlying cause of pt's fatigue.  Reassurance provided to pt.

## 2015-05-03 NOTE — Patient Instructions (Signed)
Follow up as scheduled to recheck diabetes We'll notify you of your lab results and make any changes if needed I suspect the fatigue is due to your ongoing pain and just having to deal with that every day I think pain management is a great idea! Call with any questions or concerns Hang in there!!!

## 2015-05-03 NOTE — Progress Notes (Signed)
Pre visit review using our clinic review tool, if applicable. No additional management support is needed unless otherwise documented below in the visit note. 

## 2015-05-03 NOTE — Assessment & Plan Note (Signed)
Recurrent problem for pt.  Suspect this is due to her multiple medical comorbidities and a component of deconditioning as well as her chronic pain.  Check labs to r/o metabolic cause.  Agree that pt would benefit from pain management as recommended by Ortho (Dr Jacelyn Grip).  Will defer referral to him as they have had this discussion.  Will follow and assist as able.

## 2015-05-03 NOTE — Progress Notes (Signed)
   Subjective:    Patient ID: Sarah Mcguire, female    DOB: Jan 06, 1941, 74 y.o.   MRN: [id number]  HPI Fatigue- when asked what part of her is not feeling well, 'me.  All of me'.  Pt is going through series of back injections (Dr Jacelyn Grip).  Pt reports feeling weak, tired, worn out.  Worse w/ diuretic.  Pt reports CBGs will drop to 90 'almost every day' between 2-3pm.  Denies CP, palpitations, SOB, HAs, visual changes.   Review of Systems For ROS see HPI     Objective:   Physical Exam  Constitutional: She is oriented to person, place, and time. She appears well-developed and well-nourished.  Sitting in wheelchair  HENT:  Head: Normocephalic and atraumatic.  Cardiovascular: Normal rate and intact distal pulses.   Regular w/ rate variation and possible extra beat periodically  Pulmonary/Chest: Effort normal and breath sounds normal. No respiratory distress.  Abdominal: Soft. Bowel sounds are normal. She exhibits no distension. There is no tenderness. There is no rebound.  Musculoskeletal: She exhibits no edema.  Neurological: She is alert and oriented to person, place, and time.  Skin: Skin is warm and dry.  Psychiatric: She has a normal mood and affect. Her behavior is normal. Thought content normal.  Vitals reviewed.         Assessment & Plan:

## 2015-05-04 NOTE — Telephone Encounter (Signed)
Referral placed per PCP

## 2015-05-12 ENCOUNTER — Other Ambulatory Visit: Payer: Self-pay | Admitting: Family Medicine

## 2015-05-12 ENCOUNTER — Encounter: Payer: Self-pay | Admitting: Family Medicine

## 2015-05-12 NOTE — Telephone Encounter (Signed)
Med denied, already faxed this afternoon.

## 2015-05-12 NOTE — Telephone Encounter (Signed)
Last OV 05/03/15 Alprazolam last filled 03/09/15 #90 with 1

## 2015-05-12 NOTE — Telephone Encounter (Signed)
Medication filled to pharmacy as requested.   

## 2015-05-17 ENCOUNTER — Ambulatory Visit: Payer: Medicare Other

## 2015-05-18 ENCOUNTER — Ambulatory Visit (INDEPENDENT_AMBULATORY_CARE_PROVIDER_SITE_OTHER): Payer: Medicare Other | Admitting: General Practice

## 2015-05-18 DIAGNOSIS — Z5181 Encounter for therapeutic drug level monitoring: Secondary | ICD-10-CM | POA: Diagnosis not present

## 2015-05-18 LAB — POCT INR: INR: 2.9

## 2015-05-18 NOTE — Progress Notes (Signed)
I have reviewed and agree with the plan. 

## 2015-05-18 NOTE — Progress Notes (Signed)
Pre visit review using our clinic review tool, if applicable. No additional management support is needed unless otherwise documented below in the visit note. 

## 2015-05-23 ENCOUNTER — Emergency Department (HOSPITAL_BASED_OUTPATIENT_CLINIC_OR_DEPARTMENT_OTHER): Payer: Medicare Other

## 2015-05-23 ENCOUNTER — Emergency Department (HOSPITAL_BASED_OUTPATIENT_CLINIC_OR_DEPARTMENT_OTHER)
Admission: EM | Admit: 2015-05-23 | Discharge: 2015-05-23 | Discharge status: Against Medical Advice | Payer: Medicare Other | Attending: Emergency Medicine | Admitting: Emergency Medicine

## 2015-05-23 ENCOUNTER — Encounter (HOSPITAL_BASED_OUTPATIENT_CLINIC_OR_DEPARTMENT_OTHER): Payer: Self-pay | Admitting: Emergency Medicine

## 2015-05-23 DIAGNOSIS — Z86711 Personal history of pulmonary embolism: Secondary | ICD-10-CM | POA: Diagnosis not present

## 2015-05-23 DIAGNOSIS — Z880 Allergy status to penicillin: Secondary | ICD-10-CM | POA: Insufficient documentation

## 2015-05-23 DIAGNOSIS — J449 Chronic obstructive pulmonary disease, unspecified: Secondary | ICD-10-CM | POA: Insufficient documentation

## 2015-05-23 DIAGNOSIS — E785 Hyperlipidemia, unspecified: Secondary | ICD-10-CM | POA: Insufficient documentation

## 2015-05-23 DIAGNOSIS — F419 Anxiety disorder, unspecified: Secondary | ICD-10-CM | POA: Insufficient documentation

## 2015-05-23 DIAGNOSIS — R5383 Other fatigue: Secondary | ICD-10-CM | POA: Insufficient documentation

## 2015-05-23 DIAGNOSIS — Z79899 Other long term (current) drug therapy: Secondary | ICD-10-CM | POA: Insufficient documentation

## 2015-05-23 DIAGNOSIS — Z86718 Personal history of other venous thrombosis and embolism: Secondary | ICD-10-CM | POA: Insufficient documentation

## 2015-05-23 DIAGNOSIS — D649 Anemia, unspecified: Secondary | ICD-10-CM | POA: Diagnosis not present

## 2015-05-23 DIAGNOSIS — K219 Gastro-esophageal reflux disease without esophagitis: Secondary | ICD-10-CM | POA: Diagnosis not present

## 2015-05-23 DIAGNOSIS — I10 Essential (primary) hypertension: Secondary | ICD-10-CM | POA: Insufficient documentation

## 2015-05-23 DIAGNOSIS — R079 Chest pain, unspecified: Secondary | ICD-10-CM

## 2015-05-23 DIAGNOSIS — Z7901 Long term (current) use of anticoagulants: Secondary | ICD-10-CM | POA: Insufficient documentation

## 2015-05-23 DIAGNOSIS — R0902 Hypoxemia: Secondary | ICD-10-CM | POA: Diagnosis not present

## 2015-05-23 DIAGNOSIS — I4891 Unspecified atrial fibrillation: Secondary | ICD-10-CM | POA: Insufficient documentation

## 2015-05-23 DIAGNOSIS — Z720 Tobacco use: Secondary | ICD-10-CM | POA: Insufficient documentation

## 2015-05-23 DIAGNOSIS — R0789 Other chest pain: Secondary | ICD-10-CM | POA: Diagnosis not present

## 2015-05-23 DIAGNOSIS — M1990 Unspecified osteoarthritis, unspecified site: Secondary | ICD-10-CM | POA: Insufficient documentation

## 2015-05-23 LAB — BASIC METABOLIC PANEL
ANION GAP: 6 (ref 5–15)
BUN: 24 mg/dL — AB (ref 6–20)
CALCIUM: 10.8 mg/dL — AB (ref 8.9–10.3)
CO2: 33 mmol/L — ABNORMAL HIGH (ref 22–32)
Chloride: 103 mmol/L (ref 101–111)
Creatinine, Ser: 0.82 mg/dL (ref 0.44–1.00)
GFR calc Af Amer: >60 mL/min (ref >60)
GLUCOSE: 122 mg/dL — AB (ref 65–99)
Potassium: 3.8 mmol/L (ref 3.5–5.1)
SODIUM: 142 mmol/L (ref 135–145)

## 2015-05-23 LAB — CBC
HCT: 44.9 % (ref 36.0–46.0)
HEMOGLOBIN: 14.4 g/dL (ref 12.0–15.0)
MCH: 28.1 pg (ref 26.0–34.0)
MCHC: 32.1 g/dL (ref 30.0–36.0)
MCV: 87.7 fL (ref 78.0–100.0)
Platelets: 248 10*3/uL (ref 150–400)
RBC: 5.12 MIL/uL — ABNORMAL HIGH (ref 3.87–5.11)
RDW: 15.5 % (ref 11.5–15.5)
WBC: 7.4 10*3/uL (ref 4.0–10.5)

## 2015-05-23 LAB — TROPONIN I

## 2015-05-23 LAB — PROTIME-INR
INR: 2.44 — ABNORMAL HIGH (ref 0.00–1.49)
PROTHROMBIN TIME: 26.2 s — AB (ref 11.6–15.2)

## 2015-05-23 LAB — D-DIMER, QUANTITATIVE: D-Dimer, Quant: 0.29 ug/mL-FEU (ref 0.00–0.48)

## 2015-05-23 MED ORDER — ALBUTEROL SULFATE (2.5 MG/3ML) 0.083% IN NEBU
5.0000 mg | INHALATION_SOLUTION | Freq: Once | RESPIRATORY_TRACT | Status: AC
  Administered 2015-05-23: 5 mg via RESPIRATORY_TRACT
  Filled 2015-05-23: qty 6

## 2015-05-23 MED ORDER — ALBUTEROL SULFATE HFA 108 (90 BASE) MCG/ACT IN AERS
2.0000 | INHALATION_SPRAY | RESPIRATORY_TRACT | Status: DC | PRN
  Administered 2015-05-23: 2 via RESPIRATORY_TRACT
  Filled 2015-05-23: qty 6.7

## 2015-05-23 NOTE — ED Notes (Signed)
Patient transported to X-ray 

## 2015-05-23 NOTE — ED Notes (Signed)
Patient transported to x-ray. ?

## 2015-05-23 NOTE — ED Notes (Signed)
Intermittent chest pain for 1 - 2 weeks.  Pt states when she gets upset her pain starts.  It gets better with tums.  No sob, no N/V, no diaphoresis, no dizziness, no lightheadedness.  Pt describes the pain as an ache.

## 2015-05-23 NOTE — ED Notes (Signed)
Dr. Tamera Punt is at bedside, pt sats drop to high 80's at times, placed on o2 at 1lpm via [place], sats increase to low 90's.

## 2015-05-23 NOTE — ED Provider Notes (Signed)
CSN: [id number]     Arrival date & time 05/23/15  1741 History  This chart was scribed for Sarah Johns, MD by Meriel Pica, ED Scribe. This patient was seen in room MH06/MH06 and the patient's care was started 6:32 PM.   Chief Complaint  Patient presents with  . Chest Pain   The history is provided by the patient and the spouse. No language interpreter was used.   HPI Comments: Sarah Mcguire is a 74 y.o. female, with a PMhx of HTN, HLD, GERD, PE/DVT 3 years ago, DM, COPD, and Afib, who presents to the Emergency Department complaining of intermittent, dull, non-radiating, epigastric chest pain that has been intermittent for over 1 month. She denies currently feeling the discomfort. Pt states the discomfort is worse after meals and lasts for 30 minutes after onset. Her pain is alleviated with Tums antiacids. She has LLE edema at baseline after a DVT in LLE 3 years ago. Pt taking coumadin daily; last coumadin level was 2.9, 2 weeks ago. Denies pain in LLE, radiation of pain to upper chest or neck, SOB, nausea, vomiting, diaphoresis, dizziness or light-headedness. She is not on home O2, nor is she prescribed an inhaler. Pt does have a home O2 monitor and reports her O2 sat was 95% at home PTA; current SpO2 is 90% on RA. Pt is a current smoker. She has had a stress test within the past several years, per husband. She is no longer taking Nexium for GERD.   Past Medical History  Diagnosis Date  . Hyperlipidemia   . Hypertension   . Hyperglycemia   . GERD (gastroesophageal reflux disease)   . Anemia   . Leukocytosis   . Diverticulitis   . Neuropathy   . Anxiety     SEVERE  . 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 DVT (deep venous thrombosis)   . Diabetes mellitus     borderline no medications  . Arthritis   . COPD (chronic obstructive pulmonary disease)   . Atrial fibrillation with RVR  11/28/2012   Past Surgical History  Procedure Laterality Date  . Tonsillectomy    . Tympanostomy tube placement      R ear  . 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 Diabetes Mother   . Hypertension Mother   . Heart disease Father     MI age 5's   . Diabetes Brother   . Hypertension Brother    Social History  Substance Use Topics  . Smoking status: Current Every Day Smoker -- 0.80 packs/day    Types: Cigarettes  . Smokeless tobacco: Never Used  . Alcohol Use: No   OB History    Gravida Para Term Preterm AB TAB SAB Ectopic Multiple Living   2 2 2       2      Review of Systems  Constitutional: Positive for fatigue. Negative for fever, chills and diaphoresis.  HENT: Negative for congestion, rhinorrhea and sneezing.   Eyes: Negative.   Respiratory: Negative for cough, chest tightness and shortness of breath.   Cardiovascular: Positive for chest pain. Negative for leg swelling.  Gastrointestinal: Negative for nausea, vomiting, abdominal pain, diarrhea and blood in stool.  Genitourinary: Negative for  frequency, hematuria, flank pain and difficulty urinating.  Musculoskeletal: Negative for back pain and arthralgias.  Skin: Negative for rash.  Neurological: Negative for dizziness, speech difficulty, weakness, numbness and headaches.   Allergies  Morphine and related; Amoxicillin; Amoxicillin-pot clavulanate; Citalopram hydrobromide; Clonazepam; Cymbalta; Macrobid; Penicillins; Pyridium; Zonegran; Zonisamide; Sertraline hcl; and Wellbutrin  Home Medications   Prior to Admission medications   Medication Sig Start Date End Date Taking? Authorizing Provider  acetaminophen (TYLENOL) 325 MG tablet Take 325 mg by mouth every 6 (six) hours as needed (for headache).    Historical Provider, MD  alprazolam  Duanne Moron) 2 MG tablet TAKE 1/2 TO 1 TABLET BY MOUTH 3 TIMES A DAY AS NEEDED FOR ANXIETY OR SLEEP 05/12/15   Midge Minium, MD  Cholecalciferol (VITAMIN D) 2000 UNITS tablet Take 2,000 Units by mouth daily.    Historical Provider, MD  Clobetasol Prop Emollient Base 0.05 % emollient cream S: apply twice daily for 1-2 weeks then nightly for 2 weeks and then taper off to stop.PRN afterwards for itching. Patient not taking: Reported on 05/03/2015 03/14/15   Anastasio Auerbach, MD  diltiazem (CARDIZEM CD) 120 MG 24 hr capsule Take 1 capsule (120 mg total) by mouth daily. 02/08/15   Josue Hector, MD  esomeprazole (NEXIUM) 40 MG capsule Take 40 mg by mouth daily as needed. For acid reflux. 07/09/11   Midge Minium, MD  fenofibrate 160 MG tablet TAKE 1 TABLET BY MOUTH EVERY DAY 04/05/15   Midge Minium, MD  folic acid (FOLVITE) 1 MG tablet Take 1 tablet (1 mg total) by mouth daily. 05/31/14   Midge Minium, MD  furosemide (LASIX) 40 MG tablet Take 1 tablet (40 mg total) by mouth daily. Patient taking differently: Take 20 mg by mouth daily.  11/08/14   Midge Minium, MD  HYDROcodone-acetaminophen (NORCO/VICODIN) 5-325 MG per tablet Take 1-2 tablets by mouth every 4 (four) hours as needed. 05/03/15   Midge Minium, MD  hydrocortisone-pramoxine [facility]) 2.5-1 % rectal cream Place 1 application rectally 3 (three) times daily as needed. 11/02/13   Midge Minium, MD  levothyroxine (SYNTHROID, LEVOTHROID) 50 MCG tablet Take 1 tablet (50 mcg total) by mouth daily. 01/17/15   Midge Minium, MD  lidocaine (LIDODERM) 5 % Place 1 patch onto the skin daily. Remove & Discard patch within 12 hours or as directed by MD    Historical Provider, MD  lisinopril (PRINIVIL,ZESTRIL) 10 MG tablet Take 1 tablet (10 mg total) by mouth daily. 07/12/14   Midge Minium, MD  metFORMIN (GLUCOPHAGE-XR) 500 MG 24 hr tablet Take 2 tablets by mouth in the morning with food and 1 tablet in the evening with food.  12/20/14   Midge Minium, MD  Multiple Vitamin (MULTIVITAMIN) tablet Take 1 tablet by mouth daily.      Historical Provider, MD  nystatin (MYCOSTATIN/NYSTOP) 100000 UNIT/GM POWD Apply powder to the outside of the groin as needed for irritaion 08/30/14   Midge Minium, MD  ONE TOUCH ULTRA TEST test strip TEST TWICE A DAY AS DIRECTED 05/11/14   Midge Minium, MD  simvastatin (ZOCOR) 20 MG tablet Take 1 tablet (20 mg total) by mouth every evening. 12/14/14   Midge Minium, MD  warfarin (COUMADIN) 5 MG tablet TAKE 1 TABLET ON TUESDAY, THURSDAY, SATURDAY, AND SUNDAY. TAKE 1/2 TABLET BY MOUTH THE REMAINING DAYS. 05/03/15   Midge Minium, MD   BP 132/86 mmHg  Pulse 66  Temp(Src)  98.2 F (36.8 C)  Resp 16  Ht 5\' 3"  (1.6 m)  Wt 177 lb (80.287 kg)  BMI 31.36 kg/m2  SpO2 90%  LMP  (LMP Unknown) Physical Exam  Constitutional: She is oriented to person, place, and time. She appears well-developed and well-nourished.  HENT:  Head: Normocephalic and atraumatic.  Eyes: Pupils are equal, round, and reactive to light.  Neck: Normal range of motion. Neck supple.  Cardiovascular: Normal rate, regular rhythm and normal heart sounds.   Pulmonary/Chest: Effort normal and breath sounds normal. No respiratory distress. She has no wheezes. She has no rales. She exhibits no tenderness.  Abdominal: Soft. Bowel sounds are normal. There is no tenderness. There is no rebound and no guarding.  Musculoskeletal: Normal range of motion. She exhibits no edema.  Lymphadenopathy:    She has no cervical adenopathy.  Neurological: She is alert and oriented to person, place, and time.  Skin: Skin is warm and dry. No rash noted.  Psychiatric: She has a normal mood and affect.    ED Course  Procedures  DIAGNOSTIC STUDIES: Oxygen Saturation is 90% on RA, low by my interpretation.    COORDINATION OF CARE: 6:42 PM Discussed treatment plan with pt at bedside and pt agreed to plan.   Labs  Review   Imaging Review Dg Chest 2 View  05/23/2015   CLINICAL DATA:  Intermittent central chest pain for the last 2 weeks. No other symptoms.  EXAM: CHEST  2 VIEW  COMPARISON:  Pain 09/2014  FINDINGS: Heart size is accentuated by the technique. Lungs are clear. No focal consolidations. Suspect small right pleural effusion. No pulmonary edema.  IMPRESSION: 1. Small right pleural effusion. 2. No pulmonary edema.   Electronically Signed   By: Nolon Nations M.D.   On: 05/23/2015 18:34   I have personally reviewed and evaluated these images and lab results as part of my medical decision-making.   EKG Interpretation   Date/Time:  Monday May 23 2015 17:56:44 EDT Ventricular Rate:  99 PR Interval:  200 QRS Duration: 94 QT Interval:  374 QTC Calculation: 479 R Axis:   50 Text Interpretation:  Sinus rhythm with Premature atrial complexes  Nonspecific T wave abnormality Prolonged QT Abnormal ECG since last  tracing no significant change Confirmed by BELFI  MD, MELANIE ([id number]) on  05/23/2015 6:10:16 PM      MDM   Final diagnoses:  Chest pain, unspecified chest pain type  Hypoxia    Patient presents with chest pain. She was seen a month ago for similar symptoms. Her symptoms sound suspicious for GERD. However this is her second visit and she's had a visit in the meantime with her primary care physician for increased fatigue. She has significant risk factors for coronary artery disease. Her EKG is unchanged from her prior EKG in her troponin is negative. However due to her risk factors and prior visits, I felt that it would be best if she was admitted for further cardiac evaluation. In addition she's been hypoxic in the ED. Her lungs are diminished and I suspect that she has an underlying diagnosis of COPD although she denies this. She is a longtime smoker. She does not use inhalers at home. She was given nebulizer treatment in the ED which did not improve her sats. Her d-dimer is  negative. There is no evidence of pneumonia. She does not report shortness of breath and I suspect that she might have some mild hypoxia at baseline although this is not been  diagnosed and she reports a normal oxygen saturation 95% according to her home monitor prior to arrival. However given her ongoing hypoxia with the nondescript chest discomfort, I felt that she should be admitted for further evaluation. She is adamantly refusing. I did discuss the risk of going home and she is still refusing admission. I did dispense her an albuterol inhaler to use at home. I advised her return if she has any worsening symptoms or she changes her mind about admission. I encouraged her to have close follow-up with her PCP and cardiologist. Patient left AMA.  I personally performed the services described in this documentation, which was scribed in my presence.  The recorded information has been reviewed and considered.    Sarah Johns, MD 05/23/15 2053

## 2015-05-23 NOTE — ED Notes (Signed)
Pt verbalizes understanding of leaving AMA and what the risks are of doing so.  Pt discharged with family

## 2015-05-23 NOTE — Discharge Instructions (Signed)
Chest Pain (Nonspecific) °It is often hard to give a specific diagnosis for the cause of chest pain. There is always a chance that your pain could be related to something serious, such as a heart attack or a blood clot in the lungs. You need to follow up with your health care provider for further evaluation. °CAUSES  °· Heartburn. °· Pneumonia or bronchitis. °· Anxiety or stress. °· Inflammation around your heart (pericarditis) or lung (pleuritis or pleurisy). °· A blood clot in the lung. °· A collapsed lung (pneumothorax). It can develop suddenly on its own (spontaneous pneumothorax) or from trauma to the chest. °· Shingles infection (herpes zoster virus). °The chest wall is composed of bones, muscles, and cartilage. Any of these can be the source of the pain. °· The bones can be bruised by injury. °· The muscles or cartilage can be strained by coughing or overwork. °· The cartilage can be affected by inflammation and become sore (costochondritis). °DIAGNOSIS  °Lab tests or other studies may be needed to find the cause of your pain. Your health care provider may have you take a test called an ambulatory electrocardiogram (ECG). An ECG records your heartbeat patterns over a 24-hour period. You may also have other tests, such as: °· Transthoracic echocardiogram (TTE). During echocardiography, sound waves are used to evaluate how blood flows through your heart. °· Transesophageal echocardiogram (TEE). °· Cardiac monitoring. This allows your health care provider to monitor your heart rate and rhythm in real time. °· Holter monitor. This is a portable device that records your heartbeat and can help diagnose heart arrhythmias. It allows your health care provider to track your heart activity for several days, if needed. °· Stress tests by exercise or by giving medicine that makes the heart beat faster. °TREATMENT  °· Treatment depends on what may be causing your chest pain. Treatment may include: °¨ Acid blockers for  heartburn. °¨ Anti-inflammatory medicine. °¨ Pain medicine for inflammatory conditions. °¨ Antibiotics if an infection is present. °· You may be advised to change lifestyle habits. This includes stopping smoking and avoiding alcohol, caffeine, and chocolate. °· You may be advised to keep your head raised (elevated) when sleeping. This reduces the chance of acid going backward from your stomach into your esophagus. °Most of the time, nonspecific chest pain will improve within 2-3 days with rest and mild pain medicine.  °HOME CARE INSTRUCTIONS  °· If antibiotics were prescribed, take them as directed. Finish them even if you start to feel better. °· For the next few days, avoid physical activities that bring on chest pain. Continue physical activities as directed. °· Do not use any tobacco products, including cigarettes, chewing tobacco, or electronic cigarettes. °· Avoid drinking alcohol. °· Only take medicine as directed by your health care provider. °· Follow your health care provider's suggestions for further testing if your chest pain does not go away. °· Keep any follow-up appointments you made. If you do not go to an appointment, you could develop lasting (chronic) problems with pain. If there is any problem keeping an appointment, call to reschedule. °SEEK MEDICAL CARE IF:  °· Your chest pain does not go away, even after treatment. °· You have a rash with blisters on your chest. °· You have a fever. °SEEK IMMEDIATE MEDICAL CARE IF:  °· You have increased chest pain or pain that spreads to your arm, neck, jaw, back, or abdomen. °· You have shortness of breath. °· You have an increasing cough, or you cough   up blood.  You have severe back or abdominal pain.  You feel nauseous or vomit.  You have severe weakness.  You faint.  You have chills. This is an emergency. Do not wait to see if the pain will go away. Get medical help at once. Call your local emergency services (911 in [place] Do not drive  yourself to the hospital. MAKE SURE YOU:   Understand these instructions.  Will watch your condition.  Will get help right away if you are not doing well or get worse. Document Released: 05/30/2005 Document Revised: 08/25/2013 Document Reviewed: 03/25/2008 [facility] Patient Information 2015 [place], [place]. This information is not intended to replace advice given to you by your health care provider. Make sure you discuss any questions you have with your health care provider.  Hypoxemia Hypoxemia occurs when your blood does not contain enough oxygen. The body cannot work well when it does not have enough oxygen because every part of your body needs oxygen. Oxygen travels to all parts of the body through your blood. Hypoxemia can develop suddenly or can come on slowly. CAUSES Some common causes of hypoxemia include:  Long-term (chronic) lung diseases, such as chronic obstructive pulmonary disease (COPD) or interstitial lung disease.  Disorders that affect breathing at night, such as sleep apnea.  Fluid buildup in your lungs (pulmonary edema).  Lung infection (pneumonia).  Lung or throat cancer.  Abnormal blood flow that bypasses the lungs (shunt).  Certain diseasesthat affect nerves or muscles.  A collapsed lung (pneumothorax).  A blood clot in the lungs (pulmonary embolus).  Certain types of heart disease.  Slow or shallow breathing (hypoventilation).  Certain medicines.  High altitudes.  Toxic chemicals and gases. SIGNS AND SYMPTOMS Not everyone who has hypoxemia will develop symptoms. If the hypoxemia developed quickly, you will likely have symptoms such as shortness of breath. If the hypoxemia came on slowly over months or years, you may not notice any symptoms. Symptoms can include:  Shortness of breath (dyspnea).  Bluish color of the skin, lips, or nail beds.  Breathing that is fast, noisy, or shallow.  A fast heartbeat.  Feeling tired or sleepy.  Being  confused or feeling anxious. DIAGNOSIS To determine if you have hypoxemia, your health care provider may perform:  A physical exam.  Blood tests.  A pulse oximetry. A sensor will be put on your finger, toe, or earlobe to measure the percent of oxygen in your blood. TREATMENT You will likely be treated with oxygen therapy. Depending on the cause of your hypoxemia, you may need oxygen for a short time (weeks or months), or you may need it indefinitely. Your health care provider may also recommend other therapies to treat the underlying cause of your hypoxemia. HOME CARE INSTRUCTIONS  Only take over-the-counter or prescription medicines as directed by your health care provider.  Follow oxygen safety measures if you are on oxygen therapy. These may include:  Always having a backup supply of oxygen.  Not allowing anyone to smoke around oxygen.  Handling the oxygen tanks carefully and as instructed.  If you smoke, quit. Stay away from people who smoke.  Follow up with your health care provider as directed. SEEK MEDICAL CARE IF:  You have any concerns about your oxygen therapy.  You still have trouble breathing.  You become short of breath when you exercise.  You are tired when you wake up.  You have a headache when you wake up. SEEK IMMEDIATE MEDICAL CARE IF:   Your  breathing gets worse.  You have new shortness of breath with normal activity.  You have a bluish color of the skin, lips, or nail beds.  You have confusion or cloudy thinking.  You cough up dark mucus.  You have chest pain.  You have a fever. MAKE SURE YOU:  Understand these instructions.  Will watch your condition.  Will get help right away if you are not doing well or get worse. Document Released: 03/05/2011 Document Revised: 08/25/2013 Document Reviewed: 03/19/2013 [facility] Patient Information 2015 [place], [place]. This information is not intended to replace advice given to you by your health care  provider. Make sure you discuss any questions you have with your health care provider.

## 2015-05-24 ENCOUNTER — Telehealth: Payer: Self-pay | Admitting: Family Medicine

## 2015-05-24 ENCOUNTER — Encounter: Payer: Self-pay | Admitting: Family Medicine

## 2015-05-24 ENCOUNTER — Telehealth: Payer: Self-pay | Admitting: Behavioral Health

## 2015-05-24 ENCOUNTER — Ambulatory Visit (INDEPENDENT_AMBULATORY_CARE_PROVIDER_SITE_OTHER): Payer: Medicare Other | Admitting: Family Medicine

## 2015-05-24 DIAGNOSIS — Z23 Encounter for immunization: Secondary | ICD-10-CM | POA: Diagnosis not present

## 2015-05-24 DIAGNOSIS — R7981 Abnormal blood-gas level: Secondary | ICD-10-CM | POA: Insufficient documentation

## 2015-05-24 DIAGNOSIS — K219 Gastro-esophageal reflux disease without esophagitis: Secondary | ICD-10-CM | POA: Diagnosis not present

## 2015-05-24 MED ORDER — GLUCOSE BLOOD VI STRP: ORAL_STRIP | Status: DC

## 2015-05-24 MED ORDER — ESOMEPRAZOLE MAGNESIUM 40 MG PO CPDR: 40.0000 mg | DELAYED_RELEASE_CAPSULE | Freq: Every day | ORAL | Status: AC | PRN

## 2015-05-24 NOTE — Assessment & Plan Note (Signed)
Recurrent problem for pt.  Pt and husband feel that this is cause of her CP.  Restart PPI.  Reviewed lifestyle and dietary changes that will improve sxs.  Reviewed supportive care and red flags that should prompt return.  Pt expressed understanding and is in agreement w/ plan.

## 2015-05-24 NOTE — Patient Instructions (Signed)
Follow up as scheduled Start the Nexium daily Avoid eating prior to lying down- this will worsen your reflux If your oxygen level is regularly lower than 93%, please let me know so we can set up a pulmonary appt Please call with any questions or concerns Have a great week!!

## 2015-05-24 NOTE — Progress Notes (Signed)
   Subjective:    Patient ID: Sarah Mcguire, female    DOB: [birth year], 74 y.o.   MRN: [id number]  HPI ER f/u- pt went to ER last night for recurrent CP.  Husband states that CP 'is reflux' and 'I was hoping they could give her something better than tums'.  ER MD wanted to admit pt for cardiac w/u but pt left AMA.  Pt had (-) Ddimer and no evidence of PNA on CXR.  Troponin was WNL.  EKG unchanged from previous.  Pt was restarted on Nexium.  Pt reports feeling well today.  Of note, O2 was 90% in ER yesterday.  90% here today in office.  Pt's O2 monitor indicated 94%.  Denies SOB.  Currently asymptomatic.  Pt is current smoker.   Review of Systems For ROS see HPI     Objective:   Physical Exam  Constitutional: She is oriented to person, place, and time. She appears well-developed and well-nourished. No distress.  HENT:  Head: Normocephalic and atraumatic.  Neck: Normal range of motion. Neck supple.  Cardiovascular: Normal rate, regular rhythm, normal heart sounds and intact distal pulses.   Pulmonary/Chest: Effort normal and breath sounds normal. No respiratory distress. She has no wheezes. She has no rales.  Abdominal: Soft. Bowel sounds are normal. She exhibits no distension. There is no tenderness. There is no rebound and no guarding.  Musculoskeletal: She exhibits edema (trace bilateral LE edema).  Lymphadenopathy:    She has no cervical adenopathy.  Neurological: She is alert and oriented to person, place, and time.  Skin: Skin is warm and dry.  Psychiatric: She has a normal mood and affect. Her behavior is normal. Thought content normal.  Vitals reviewed.         Assessment & Plan:

## 2015-05-24 NOTE — Assessment & Plan Note (Signed)
New.  Pt's O2 saturation last night was 90% in ER.  Was initially 90% here in office but increased to 93-94% RA.  Pt denies SOB.  Does not use inhalers.  Current smoker- again encouraged to quit.  Offered pulmonary referral but pt and husband declined.  They do have a pulse ox for home use and pt reports she will notify me if her O2 sats are consistently in the 90-91% range.  Will follow.

## 2015-05-24 NOTE — Telephone Encounter (Signed)
PLEASE NOTE: All timestamps contained within this report are represented as [place] Standard Time. CONFIDENTIALTY NOTICE: This fax transmission is intended only for the addressee. It contains information that is legally privileged, confidential or otherwise protected from use or disclosure. If you are not the intended recipient, you are strictly prohibited from reviewing, disclosing, copying using or disseminating any of this information or taking any action in reliance on or regarding this information. If you have received this fax in error, please notify us immediately by telephone so that we can arrange for its return to [place]. Phone: [phone], Toll-Free: [phone], Fax: [phone] Page: 1 of 1 Call Id: [id number] [facility] - Client [place] Patient Name: Sarah Mcguire DOB: 1941/05/08 Initial Comment Caller states his wife was in ED last night with chest pain, has appt this afternoon, xfer from office for triage Nurse Assessment Nurse: Ronnald Ramp, RN, Miranda Date/Time (Eastern Time): 05/24/2015 8:31:13 AM Confirm and document reason for call. If symptomatic, describe symptoms. ---Caller states his wife was seen in ED last night for chest pain. They were told to call today for a follow up appt. The pt is not currently having symptoms. Caller states the pt has an appt at 1145 today. Has the patient traveled out of the country within the last 30 days? ---Not Applicable Does the patient require triage? ---No Guidelines Guideline Title Affirmed Question Affirmed Notes Final Disposition User Comments After disconnecting, checked schedule and did not find a visit scheduled for today. Called office, Spoke with [place]. pt does have a visit for 11am today. This was not listed so apparently it was scheduled after transferring the pt to the nurse line. Contacted caller and verified that the appt is at 11am today.

## 2015-05-24 NOTE — Telephone Encounter (Signed)
Pt triaged by office RN.  Kathlen Brunswick, RN at 05/24/2015 9:00 AM     Status: Signed       Expand All Collapse All   Spoke with the patient's spouse, Shanon Brow regarding their visit to the ED on yesterday and the patient's  current state. Initially they went to the ED for chest pain. He reported that at this time the patient is doing  ok, not having any chest pain and her breathing is normal. Also, per the husband, he checked her oxygen  level this morning with their home device and the patient was 95%. The patient has not been difficult to  awake, no confusion or fainting. Confirmed appointment for this morning at 11:00 AM and advised that if  symptoms worsen prior to this office visit to go to the emergency room. Patient's husband understood and  did not have any further concerns at the end of call.

## 2015-05-24 NOTE — Progress Notes (Signed)
Pre visit review using our clinic review tool, if applicable. No additional management support is needed unless otherwise documented below in the visit note. 

## 2015-05-24 NOTE — Assessment & Plan Note (Signed)
Resolved per pt report.  Reviewed EKG done last night- unchanged from previous.  Normal troponin and D dimer.  No evidence of PNA on CXR.  Pt and husband feel strongly that CP was due to GERD.  Pt is asymptomatic today.  Plan is to restart PPI.  If this does not improve her sxs, she will need cardiology f/u.  Pt expressed understanding and is in agreement w/ plan.

## 2015-05-24 NOTE — Telephone Encounter (Signed)
Spoke with the patient's spouse, Shanon Brow regarding their visit to the ED on yesterday and the patient's  current state. Initially they went to the ED for chest pain. He reported that at this time the patient is doing  ok, not having any chest pain and her breathing is normal. Also, per the husband, he checked her oxygen  level this morning with their home device and the patient was 95%. The patient has not been difficult to  awake, no confusion or fainting. Confirmed appointment for this morning at 11:00 AM and advised that if  symptoms worsen prior to this office visit to go to the emergency room. Patient's husband understood and  did not have any further concerns at the end of call.

## 2015-06-02 ENCOUNTER — Other Ambulatory Visit: Payer: Self-pay | Admitting: General Practice

## 2015-06-02 ENCOUNTER — Encounter: Payer: Self-pay | Admitting: Family Medicine

## 2015-06-02 ENCOUNTER — Ambulatory Visit (INDEPENDENT_AMBULATORY_CARE_PROVIDER_SITE_OTHER): Payer: Medicare Other | Admitting: Family Medicine

## 2015-06-02 VITALS — BP 128/86 | HR 52 | Temp 98.4°F | Resp 16 | Wt 173.1 lb

## 2015-06-02 DIAGNOSIS — R399 Unspecified symptoms and signs involving the genitourinary system: Secondary | ICD-10-CM

## 2015-06-02 DIAGNOSIS — R1084 Generalized abdominal pain: Secondary | ICD-10-CM | POA: Diagnosis not present

## 2015-06-02 LAB — POCT URINALYSIS DIPSTICK
Bilirubin, UA: NEGATIVE
Glucose, UA: NEGATIVE
KETONES UA: NEGATIVE
Nitrite, UA: NEGATIVE
PROTEIN UA: NEGATIVE
SPEC GRAV UA: 1.015
UROBILINOGEN UA: 0.2
pH, UA: 8

## 2015-06-02 MED ORDER — CEPHALEXIN 500 MG PO CAPS: 500.0000 mg | ORAL_CAPSULE | Freq: Two times a day (BID) | ORAL | Status: AC

## 2015-06-02 MED ORDER — HYDROCODONE-ACETAMINOPHEN 5-325 MG PO TABS: 1.0000 | ORAL_TABLET | ORAL | Status: DC | PRN

## 2015-06-02 NOTE — Patient Instructions (Signed)
Follow up as scheduled Start the Keflex twice daily Drink plenty of fluids Call with any questions or concerns Have a great weekend!!

## 2015-06-02 NOTE — Progress Notes (Signed)
Pre visit review using our clinic review tool, if applicable. No additional management support is needed unless otherwise documented below in the visit note. 

## 2015-06-02 NOTE — Progress Notes (Signed)
   Subjective:    Patient ID: Sarah Mcguire, female    DOB: Feb 03, 1941, 74 y.o.   MRN: [id number]  HPI Lower abd pain- pain is suprapubic.  sxs started 1 week ago.  Denies frequency.  Some urgency.  + dysuria 'in the beginning but seems to be clearing'.  No fevers.  Suprapubic pressure- particularly after voiding.     Review of Systems For ROS see HPI     Objective:   Physical Exam  Constitutional: She is oriented to person, place, and time. She appears well-developed and well-nourished. No distress.  HENT:  Head: Normocephalic and atraumatic.  Eyes: Conjunctivae and EOM are normal. Pupils are equal, round, and reactive to light.  Neck: Normal range of motion. Neck supple.  Cardiovascular: Normal rate, regular rhythm and normal heart sounds.   Pulmonary/Chest: Effort normal and breath sounds normal. No respiratory distress. She has no wheezes. She has no rales.  Abdominal: Soft. Bowel sounds are normal. She exhibits no distension. There is no tenderness (no suprapubic or CVA tenderness). There is no rebound and no guarding.  Lymphadenopathy:    She has no cervical adenopathy.  Neurological: She is alert and oriented to person, place, and time.  Skin: Skin is warm and dry.  Psychiatric: She has a normal mood and affect. Her behavior is normal. Thought content normal.  Vitals reviewed.         Assessment & Plan:  l

## 2015-06-03 LAB — URINE CULTURE
Colony Count: NO GROWTH
ORGANISM ID, BACTERIA: NO GROWTH

## 2015-06-03 NOTE — Assessment & Plan Note (Signed)
Recurrent issue for pt.  sxs and UA consistent w/ infxn.  Start keflex while awaiting cx results.  Pt has tolerated keflex in the past despite her PCN allergy.  Reviewed supportive care and red flags that should prompt return.  Pt expressed understanding and is in agreement w/ plan.

## 2015-06-05 ENCOUNTER — Encounter: Payer: Self-pay | Admitting: Family Medicine

## 2015-06-06 ENCOUNTER — Encounter: Payer: Self-pay | Admitting: Family Medicine

## 2015-06-08 ENCOUNTER — Telehealth: Payer: Self-pay | Admitting: Family Medicine

## 2015-06-08 ENCOUNTER — Other Ambulatory Visit: Payer: Self-pay | Admitting: Family Medicine

## 2015-06-08 NOTE — Telephone Encounter (Signed)
Error/gd °

## 2015-06-08 NOTE — Telephone Encounter (Signed)
Called and spoke with pt husband. Due to continuing abd pain. Pt was scheduled for 3pm tomorrow.

## 2015-06-08 NOTE — Telephone Encounter (Signed)
There was no UTI on urine culture.  No need for repeat abx.  I did respond to pt's message and indicated that if she was still symptomatic after completing abx, would need appt for re-eval.  I am sorry that message never got relayed.

## 2015-06-08 NOTE — Telephone Encounter (Signed)
Caller name: Shanon Brow Relation to VZ:[id number] Call back [phone] Pharmacy:[email or web address]  Reason for call: husband states he sent a message through my chart on 10/3 but never got a response, states pt is still have problems on and off and would like to know if dr. Birdie Riddle can provide another dosage of antibiotic rx cephALEXin (KEFLEX) 500 MG capsule states pt finish the meds on yesterday.  Would like a call back to let him know if rx was sent

## 2015-06-08 NOTE — Telephone Encounter (Signed)
Medication filled to pharmacy as requested.   

## 2015-06-09 ENCOUNTER — Ambulatory Visit (INDEPENDENT_AMBULATORY_CARE_PROVIDER_SITE_OTHER): Payer: Medicare Other | Admitting: Family Medicine

## 2015-06-09 ENCOUNTER — Encounter: Payer: Self-pay | Admitting: Family Medicine

## 2015-06-09 VITALS — BP 126/82 | HR 57 | Temp 97.9°F | Resp 17 | Wt 172.5 lb

## 2015-06-09 DIAGNOSIS — R1084 Generalized abdominal pain: Secondary | ICD-10-CM

## 2015-06-09 DIAGNOSIS — R82998 Other abnormal findings in urine: Secondary | ICD-10-CM

## 2015-06-09 DIAGNOSIS — R109 Unspecified abdominal pain: Secondary | ICD-10-CM | POA: Diagnosis not present

## 2015-06-09 DIAGNOSIS — N390 Urinary tract infection, site not specified: Secondary | ICD-10-CM

## 2015-06-09 LAB — POCT URINALYSIS DIPSTICK
Bilirubin, UA: NEGATIVE
Blood, UA: NEGATIVE
GLUCOSE UA: NEGATIVE
Ketones, UA: NEGATIVE
NITRITE UA: NEGATIVE
PROTEIN UA: NEGATIVE
Spec Grav, UA: 1.015
UROBILINOGEN UA: 0.2
pH, UA: 7.5

## 2015-06-09 NOTE — Progress Notes (Signed)
Pre visit review using our clinic review tool, if applicable. No additional management support is needed unless otherwise documented below in the visit note. 

## 2015-06-09 NOTE — Progress Notes (Signed)
   Subjective:    Patient ID: Sarah Mcguire, female    DOB: 15-Feb-1941, 74 y.o.   MRN: [id number]  HPI Pelvic pain- pain is mild, located R of center.  Pt felt sxs were improving on Keflex but cx was negative.  No burning w/ urination.  No N/V/D.  'it's just kinda sore'.  No fevers.  sxs started ~3 weeks ago.  Pain is worse at night 'after dinner'.  Pain improves after BM.  No difficulty sleeping.  Denies constipation.  No fevers, chills.     Review of Systems For ROS see HPI     Objective:   Physical Exam  Constitutional: She is oriented to person, place, and time. She appears well-developed and well-nourished. No distress.  HENT:  Head: Normocephalic and atraumatic.  Eyes: Conjunctivae and EOM are normal. Pupils are equal, round, and reactive to light.  Neck: Normal range of motion. Neck supple.  Cardiovascular: Normal rate, regular rhythm and normal heart sounds.   Pulmonary/Chest: Effort normal and breath sounds normal. No respiratory distress. She has no wheezes. She has no rales.  Abdominal: Soft. Bowel sounds are normal. She exhibits no distension. There is no tenderness. There is no rebound and no guarding.  Lymphadenopathy:    She has no cervical adenopathy.  Neurological: She is alert and oriented to person, place, and time.  Skin: Skin is warm and dry.  Psychiatric: She has a normal mood and affect. Her behavior is normal. Thought content normal.  Vitals reviewed.         Assessment & Plan:

## 2015-06-09 NOTE — Patient Instructions (Signed)
Follow up as needed We'll notify you of your lab results and make any changes if needed Increase your water intake Start stool softener daily in case of constipation If symptoms continue, the next step is urology Call with any questions or concerns Have a great weekend!

## 2015-06-10 ENCOUNTER — Encounter: Payer: Medicare Other | Admitting: Family Medicine

## 2015-06-10 LAB — CBC WITH DIFFERENTIAL/PLATELET
Basophils Absolute: 0.1 10*3/uL (ref 0.0–0.1)
Basophils Relative: 1.2 % (ref 0.0–3.0)
EOS PCT: 1.4 % (ref 0.0–5.0)
Eosinophils Absolute: 0.1 10*3/uL (ref 0.0–0.7)
HCT: 44.7 % (ref 36.0–46.0)
Hemoglobin: 14.6 g/dL (ref 12.0–15.0)
LYMPHS ABS: 2.7 10*3/uL (ref 0.7–4.0)
Lymphocytes Relative: 32.3 % (ref 12.0–46.0)
MCHC: 32.6 g/dL (ref 30.0–36.0)
MCV: 85.1 fl (ref 78.0–100.0)
MONOS PCT: 8.1 % (ref 3.0–12.0)
Monocytes Absolute: 0.7 10*3/uL (ref 0.1–1.0)
NEUTROS ABS: 4.7 10*3/uL (ref 1.4–7.7)
NEUTROS PCT: 57 % (ref 43.0–77.0)
PLATELETS: 240 10*3/uL (ref 150.0–400.0)
RBC: 5.25 Mil/uL — ABNORMAL HIGH (ref 3.87–5.11)
RDW: 15.8 % — ABNORMAL HIGH (ref 11.5–15.5)
WBC: 8.3 10*3/uL (ref 4.0–10.5)

## 2015-06-10 LAB — BASIC METABOLIC PANEL
BUN: 26 mg/dL — ABNORMAL HIGH (ref 6–23)
CALCIUM: 10.5 mg/dL (ref 8.4–10.5)
CO2: 32 mEq/L (ref 19–32)
CREATININE: 0.97 mg/dL (ref 0.40–1.20)
Chloride: 103 mEq/L (ref 96–112)
GFR: 59.47 mL/min — AB (ref >60.00)
Glucose, Bld: 106 mg/dL — ABNORMAL HIGH (ref 70–99)
Potassium: 4.7 mEq/L (ref 3.5–5.1)
Sodium: 142 mEq/L (ref 135–145)

## 2015-06-11 LAB — URINE CULTURE: Colony Count: 40000

## 2015-06-12 NOTE — Assessment & Plan Note (Signed)
Recurrent problem for pt.  UA is again slightly abnormal but will hold on abx until cx results available as pt just completed course of abx and cx was negative.  Pt's PE WNL here today in office.  Will check CBC to r/o systemic infection.  If cx again negative, will refer pt to urology for complete evaluation of suprapubic pain and abnormal UA.  Reviewed supportive care and red flags that should prompt return.  Pt expressed understanding and is in agreement w/ plan.

## 2015-06-13 ENCOUNTER — Other Ambulatory Visit: Payer: Self-pay | Admitting: *Deleted

## 2015-06-13 NOTE — Telephone Encounter (Signed)
Drug-Drug Interaction Anticoagulants / Quinolones Significance: Severe Warning: Hypoprothrombinemic effects of warfarin may be increased by ciprofloxacin. Dosage reduction of warfarin may be required. Onset: Delayed Document Level: Probable Interacting Medications/Orders:  Anticoagulants Oral or Non-Oral, Systemic Quinolones Oral or Non-Oral, Systemic  1. warfarin Order ([id number]): warfarin (COUMADIN) 5 MG tablet Route: none Start: 05/03/2015 End: none Frequency:  1. ciprofloxacin Order: ciprofloxacin (CIPRO) 500 MG tablet Route: Oral Start: 06/13/2015 End: none Frequency: 2 times daily   Management Code: Professional review suggested Effects: Hypoprothrombinemic effects of warfarin may be increased by ciprofloxacin. Dosage reduction of warfarin may be required. Mechanism: Unknown. Management: Dosage reduction of warfarin may be needed during concurrent administration of ciprofloxacin. Coagulation status should be monitored, at least during the initial period of concomitant use, and the anticoagulant dose should be adjusted, if needed, accordingly. An alternative antimicrobial agent should be considered.  Patient informed, understood & agreed; I then had this severe Drug-Drug Interaction when attempting to send Rx to pharmacy; pt had Abnormal PT/INR on 09.19.16 and has multiple Allergies listed, as well as Cardiac issues; return call to patient to explain that we would need further information from PCP before sending ABX/SLS Please Advise/SLS

## 2015-06-13 NOTE — Telephone Encounter (Signed)
-----   Message from Midge Minium, MD sent at 06/13/2015  9:49 AM EDT ----- Your culture just resulted and shows that you do have a low number of bacterial colonies that are sensitive to Cipro.  Based on this, we'll start Cipro 500mg  twice daily x5 days to treat

## 2015-06-14 ENCOUNTER — Ambulatory Visit: Payer: Medicare Other

## 2015-06-14 MED ORDER — CIPROFLOXACIN HCL 500 MG PO TABS: 500.0000 mg | ORAL_TABLET | Freq: Two times a day (BID) | ORAL | Status: DC

## 2015-06-14 NOTE — Telephone Encounter (Signed)
Medication filled to pharmacy as requested.   

## 2015-06-14 NOTE — Telephone Encounter (Signed)
Yes- there is an interaction w/ coumadin but due to her multiple allergies and the sensitivities of the bacteria, we don't have many options.  She will just need to have her coumadin checked later this week after starting abx

## 2015-06-14 NOTE — Telephone Encounter (Signed)
Pt husband called bc pharmacy did not have it. Please call him if it is sent back in.

## 2015-06-17 ENCOUNTER — Ambulatory Visit (INDEPENDENT_AMBULATORY_CARE_PROVIDER_SITE_OTHER): Payer: Medicare Other | Admitting: General Practice

## 2015-06-17 DIAGNOSIS — Z5181 Encounter for therapeutic drug level monitoring: Secondary | ICD-10-CM | POA: Diagnosis not present

## 2015-06-17 LAB — POCT INR: INR: 4.5

## 2015-06-17 NOTE — Progress Notes (Signed)
Pre visit review using our clinic review tool, if applicable. No additional management support is needed unless otherwise documented below in the visit note. 

## 2015-06-17 NOTE — Progress Notes (Signed)
I have reviewed and agree with the plan. 

## 2015-06-20 ENCOUNTER — Ambulatory Visit (INDEPENDENT_AMBULATORY_CARE_PROVIDER_SITE_OTHER): Payer: Medicare Other | Admitting: Family Medicine

## 2015-06-20 ENCOUNTER — Encounter: Payer: Self-pay | Admitting: Family Medicine

## 2015-06-20 VITALS — BP 124/80 | HR 92 | Temp 98.0°F | Resp 16 | Ht 63.0 in | Wt 174.0 lb

## 2015-06-20 DIAGNOSIS — E1140 Type 2 diabetes mellitus with diabetic neuropathy, unspecified: Secondary | ICD-10-CM | POA: Diagnosis not present

## 2015-06-20 DIAGNOSIS — E1169 Type 2 diabetes mellitus with other specified complication: Secondary | ICD-10-CM | POA: Diagnosis not present

## 2015-06-20 DIAGNOSIS — I10 Essential (primary) hypertension: Secondary | ICD-10-CM | POA: Diagnosis not present

## 2015-06-20 DIAGNOSIS — E785 Hyperlipidemia, unspecified: Secondary | ICD-10-CM

## 2015-06-20 NOTE — Patient Instructions (Signed)
Follow up in 3-4 months to recheck diabetes We'll notify you of your lab results and make any changes if needed Keep up the good work!  You're doing great!!! Call with any questions or concerns Happy Fall!!!

## 2015-06-20 NOTE — Progress Notes (Signed)
Pre visit review using our clinic review tool, if applicable. No additional management support is needed unless otherwise documented below in the visit note. 

## 2015-06-20 NOTE — Progress Notes (Signed)
   Subjective:    Patient ID: Sarah Mcguire, female    DOB: 1940/11/27, 74 y.o.   MRN: [id number]  HPI DM- chronic problem, on Metformin XR daily.  On ACE for renal protection.  UTD on foot and eye exam.  Pt reports home CBGs are 'wonderful'- fasting 110s. Denies symptomatic lows.   Denies numbness/tingling of hands/feet.  HTN- chronic problem, on Lisinopril, Dilt, Lasix.  Well controlled.  Denies CP, SOB, HAs, visual changes, edema above baseline.  Hyperlipidemia- chronic problem, on Fenofibrate and Simvastatin.  Denies abd pain, N/V, myalgias   Review of Systems For ROS see HPI     Objective:   Physical Exam  Constitutional: She is oriented to person, place, and time. She appears well-developed and well-nourished. No distress.  HENT:  Head: Normocephalic and atraumatic.  Eyes: Conjunctivae and EOM are normal. Pupils are equal, round, and reactive to light.  Neck: Normal range of motion. Neck supple. No thyromegaly present.  Cardiovascular: Normal rate, regular rhythm, normal heart sounds and intact distal pulses.   No murmur heard. Pulmonary/Chest: Effort normal and breath sounds normal. No respiratory distress.  Abdominal: Soft. She exhibits no distension. There is no tenderness.  Musculoskeletal: She exhibits edema (trace edema bilaterally).  Lymphadenopathy:    She has no cervical adenopathy.  Neurological: She is alert and oriented to person, place, and time.  Skin: Skin is warm and dry.  Psychiatric: She has a normal mood and affect. Her behavior is normal.  Vitals reviewed.         Assessment & Plan:

## 2015-06-21 ENCOUNTER — Encounter: Payer: Self-pay | Admitting: Family Medicine

## 2015-06-21 LAB — BASIC METABOLIC PANEL
BUN: 23 mg/dL (ref 6–23)
CALCIUM: 10.5 mg/dL (ref 8.4–10.5)
CHLORIDE: 104 meq/L (ref 96–112)
CO2: 34 meq/L — AB (ref 19–32)
Creatinine, Ser: 0.88 mg/dL (ref 0.40–1.20)
GFR: 66.54 mL/min (ref >60.00)
Glucose, Bld: 99 mg/dL (ref 70–99)
Potassium: 4.5 mEq/L (ref 3.5–5.1)
Sodium: 143 mEq/L (ref 135–145)

## 2015-06-21 LAB — CBC WITH DIFFERENTIAL/PLATELET
BASOS ABS: 0.1 10*3/uL (ref 0.0–0.1)
Basophils Relative: 1 % (ref 0.0–3.0)
EOS PCT: 1.2 % (ref 0.0–5.0)
Eosinophils Absolute: 0.1 10*3/uL (ref 0.0–0.7)
HEMATOCRIT: 44.6 % (ref 36.0–46.0)
Hemoglobin: 14.6 g/dL (ref 12.0–15.0)
LYMPHS PCT: 24.9 % (ref 12.0–46.0)
Lymphs Abs: 2.1 10*3/uL (ref 0.7–4.0)
MCHC: 32.6 g/dL (ref 30.0–36.0)
MCV: 85.2 fl (ref 78.0–100.0)
MONOS PCT: 6.5 % (ref 3.0–12.0)
Monocytes Absolute: 0.5 10*3/uL (ref 0.1–1.0)
NEUTROS ABS: 5.6 10*3/uL (ref 1.4–7.7)
Neutrophils Relative %: 66.4 % (ref 43.0–77.0)
PLATELETS: 238 10*3/uL (ref 150.0–400.0)
RBC: 5.24 Mil/uL — AB (ref 3.87–5.11)
RDW: 15.8 % — ABNORMAL HIGH (ref 11.5–15.5)
WBC: 8.4 10*3/uL (ref 4.0–10.5)

## 2015-06-21 LAB — HEPATIC FUNCTION PANEL
ALT: 17 U/L (ref 0–35)
AST: 21 U/L (ref 0–37)
Albumin: 4.1 g/dL (ref 3.5–5.2)
Alkaline Phosphatase: 36 U/L — ABNORMAL LOW (ref 39–117)
BILIRUBIN DIRECT: 0.1 mg/dL (ref 0.0–0.3)
TOTAL PROTEIN: 6.9 g/dL (ref 6.0–8.3)
Total Bilirubin: 0.3 mg/dL (ref 0.2–1.2)

## 2015-06-21 LAB — LIPID PANEL
Cholesterol: 136 mg/dL (ref 0–200)
HDL: 50.1 mg/dL (ref >39.00)
LDL Cholesterol: 57 mg/dL (ref 0–99)
NonHDL: 86.39
TRIGLYCERIDES: 149 mg/dL (ref 0.0–149.0)
Total CHOL/HDL Ratio: 3
VLDL: 29.8 mg/dL (ref 0.0–40.0)

## 2015-06-21 LAB — TSH: TSH: 1.22 u[IU]/mL (ref 0.35–4.50)

## 2015-06-21 LAB — HEMOGLOBIN A1C: HEMOGLOBIN A1C: 7 % — AB (ref 4.6–6.5)

## 2015-06-21 NOTE — Assessment & Plan Note (Signed)
Chronic problem.  Tolerating statin w/o difficulty.  Check labs.  Adjust meds prn  

## 2015-06-21 NOTE — Assessment & Plan Note (Signed)
Chronic problem.  Tolerating metformin w/o difficulty.  Pt reports home CBGs are 'excellent'.  UTD on eye exam, foot exam.  On ACE for renal protection.  Check labs.  Adjust meds prn

## 2015-06-21 NOTE — Assessment & Plan Note (Signed)
Chronic problem.  Well controlled.  Asymptomatic.  Check labs.  No anticipated med changes. 

## 2015-06-30 ENCOUNTER — Telehealth: Payer: Self-pay | Admitting: Family Medicine

## 2015-06-30 MED ORDER — HYDROCODONE-ACETAMINOPHEN 5-325 MG PO TABS: 1.0000 | ORAL_TABLET | ORAL | Status: DC | PRN

## 2015-06-30 NOTE — Telephone Encounter (Signed)
Pt husband calling for refill on hydrocodone. Pt has a day or two left. Call [phone] when RX is ready for pick up.

## 2015-06-30 NOTE — Telephone Encounter (Signed)
Last OV 06/20/15 Hydrocodone last filled 06/02/15 #90 with 0

## 2015-06-30 NOTE — Telephone Encounter (Signed)
Med filled and pt notified available at front desk for pick up.  

## 2015-06-30 NOTE — Telephone Encounter (Signed)
Ok for 90

## 2015-07-04 ENCOUNTER — Ambulatory Visit (INDEPENDENT_AMBULATORY_CARE_PROVIDER_SITE_OTHER): Payer: Medicare Other | Admitting: General Practice

## 2015-07-04 ENCOUNTER — Other Ambulatory Visit: Payer: Self-pay | Admitting: Family Medicine

## 2015-07-04 DIAGNOSIS — Z5181 Encounter for therapeutic drug level monitoring: Secondary | ICD-10-CM

## 2015-07-04 LAB — POCT INR: INR: 3.6

## 2015-07-04 NOTE — Telephone Encounter (Signed)
Medication filled to pharmacy as requested.   

## 2015-07-04 NOTE — Progress Notes (Signed)
Ok with this plan 

## 2015-07-04 NOTE — Progress Notes (Signed)
Pre visit review using our clinic review tool, if applicable. No additional management support is needed unless otherwise documented below in the visit note. 

## 2015-07-08 ENCOUNTER — Other Ambulatory Visit: Payer: Self-pay | Admitting: Family Medicine

## 2015-07-08 NOTE — Telephone Encounter (Signed)
Medication filled to pharmacy as requested.   

## 2015-07-08 NOTE — Telephone Encounter (Signed)
Last Ov 06/20/15 Alprazolam last filled 05/12/15 #90 with 1

## 2015-07-11 ENCOUNTER — Other Ambulatory Visit: Payer: Self-pay | Admitting: Gynecology

## 2015-07-14 ENCOUNTER — Ambulatory Visit (INDEPENDENT_AMBULATORY_CARE_PROVIDER_SITE_OTHER): Payer: Medicare Other | Admitting: General Practice

## 2015-07-14 DIAGNOSIS — Z5181 Encounter for therapeutic drug level monitoring: Secondary | ICD-10-CM

## 2015-07-14 LAB — POCT INR: INR: 2.6

## 2015-07-14 NOTE — Progress Notes (Signed)
Pre visit review using our clinic review tool, if applicable. No additional management support is needed unless otherwise documented below in the visit note. 

## 2015-07-14 NOTE — Progress Notes (Signed)
I agree with this plan.

## 2015-07-18 ENCOUNTER — Ambulatory Visit: Payer: Medicare Other

## 2015-07-18 ENCOUNTER — Telehealth: Payer: Self-pay

## 2015-07-18 ENCOUNTER — Other Ambulatory Visit: Payer: Self-pay | Admitting: Gynecology

## 2015-07-18 ENCOUNTER — Other Ambulatory Visit: Payer: Self-pay

## 2015-07-18 MED ORDER — TERCONAZOLE 0.4 % VA CREA: 1.0000 | TOPICAL_CREAM | Freq: Every day | VAGINAL | Status: DC

## 2015-07-18 NOTE — Telephone Encounter (Signed)
Okay for Terazol 7 day cream with 1 refill

## 2015-07-18 NOTE — Telephone Encounter (Signed)
Encounter already opened. 

## 2015-07-18 NOTE — Telephone Encounter (Signed)
Husband informed. Rx sent.

## 2015-07-18 NOTE — Telephone Encounter (Signed)
Patient's husband called stating she is having a yeast infection.  Terribly irritated internally and itching externally. No apparent vaginal discharge. He said they had just a little Terazol cream left.    She saw you back in July and you prescribed Temovate Cream.  Husband said that her sx are more internal this time.

## 2015-08-01 ENCOUNTER — Telehealth: Payer: Self-pay | Admitting: *Deleted

## 2015-08-01 ENCOUNTER — Other Ambulatory Visit: Payer: Self-pay | Admitting: General Practice

## 2015-08-01 ENCOUNTER — Ambulatory Visit (INDEPENDENT_AMBULATORY_CARE_PROVIDER_SITE_OTHER): Payer: Medicare Other | Admitting: Gynecology

## 2015-08-01 ENCOUNTER — Encounter: Payer: Self-pay | Admitting: Gynecology

## 2015-08-01 VITALS — BP 132/76

## 2015-08-01 DIAGNOSIS — N898 Other specified noninflammatory disorders of vagina: Secondary | ICD-10-CM | POA: Diagnosis not present

## 2015-08-01 MED ORDER — HYDROCODONE-ACETAMINOPHEN 5-325 MG PO TABS: 1.0000 | ORAL_TABLET | ORAL | Status: DC | PRN

## 2015-08-01 MED ORDER — TERCONAZOLE 0.4 % VA CREA: 1.0000 | TOPICAL_CREAM | Freq: Every day | VAGINAL | Status: DC

## 2015-08-01 MED ORDER — ALPRAZOLAM 2 MG PO TABS: ORAL_TABLET | ORAL | Status: DC

## 2015-08-01 MED ORDER — CLOBETASOL PROPIONATE 0.05 % EX CREA: TOPICAL_CREAM | CUTANEOUS | Status: DC

## 2015-08-01 NOTE — Progress Notes (Signed)
Sarah Mcguire [birth year] [id number]        74 y.o.  [id number] Presents with her husband with ongoing vaginal irritation. Patient has a long history of recurrent vaginal itching and irritation. She underwent a biopsy of a specific area that was very itchy in July which turned out to show some inflammation but no dysplasia or other abnormalities. She's been treated with a variety of medications to include topical steroids, topical antifungal's and antibacterials.  She was to start on clobetasol per phone call 03/2015 but per her husband's history is difficult to tell that she actually used it. She called over the phone 2 weeks ago and was treated with Terazol cream and notes that her itching seems better but still has some internal itching. No discharge or urinary symptoms.  Past medical history,surgical history, problem list, medications, allergies, family history and social history were all reviewed and documented in the EPIC chart.  Directed ROS with pertinent positives and negatives documented in the history of present illness/assessment and plan.  Exam: Earnest Bailey assistant Filed Vitals:   08/01/15 1233  BP: 132/76   General appearance:  Normal External BUS vagina with atrophic changes. No specific lesions or discharge. Bimanual without masses or tenderness  Assessment/Plan:  74 y.o. [id number] persistent/recurrent vaginal irritation. Seems somewhat better with Terazol. Does not appear that she started clobetasol as recommended in July. We'll go ahead and prescribed clobetasol 0.05% cream to apply nightly for one month and then wean off externally. Terazol internally for 2 more weeks with one refill. Discussed possible referral elsewhere if persists to the vaginitis clinic. Discussed possible vaginal estrogen but husband relates an "allergic reaction" to estrogen pills and patches. They are very reluctant to use this. They do not want oral antifungal's due to possible interference with her  Coumadin.    Anastasio Auerbach MD, 1:00 PM 08/01/2015

## 2015-08-01 NOTE — Telephone Encounter (Signed)
Pt called stating she is feeling better with Terazol cream but does have on spot internal that burns when inserting cream, still has some external itching as well. OV? Change Rx? Please advise

## 2015-08-01 NOTE — Telephone Encounter (Signed)
Pt would like to come to be seen for OV due to the below, front desk will schedule for patient.

## 2015-08-01 NOTE — Patient Instructions (Signed)
Use the clobetasol cream nightly for one month externally and then wean off of it. Use the Terazol cream internally nightly for 2 weeks

## 2015-08-04 ENCOUNTER — Other Ambulatory Visit: Payer: Self-pay | Admitting: Family Medicine

## 2015-08-04 NOTE — Telephone Encounter (Signed)
Medication filled to pharmacy as requested.   

## 2015-08-08 ENCOUNTER — Ambulatory Visit (INDEPENDENT_AMBULATORY_CARE_PROVIDER_SITE_OTHER): Payer: Medicare Other | Admitting: General Practice

## 2015-08-08 DIAGNOSIS — Z5181 Encounter for therapeutic drug level monitoring: Secondary | ICD-10-CM

## 2015-08-08 LAB — POCT INR: INR: 2

## 2015-08-08 NOTE — Progress Notes (Signed)
I agree with this plan.

## 2015-08-08 NOTE — Progress Notes (Signed)
Pre visit review using our clinic review tool, if applicable. No additional management support is needed unless otherwise documented below in the visit note. 

## 2015-09-01 ENCOUNTER — Encounter: Payer: Self-pay | Admitting: General Practice

## 2015-09-01 ENCOUNTER — Other Ambulatory Visit: Payer: Self-pay | Admitting: Family Medicine

## 2015-09-01 MED ORDER — HYDROCORTISONE ACE-PRAMOXINE 2.5-1 % RE CREA: 1.0000 "application " | TOPICAL_CREAM | Freq: Three times a day (TID) | RECTAL | Status: DC | PRN

## 2015-09-01 MED ORDER — HYDROCODONE-ACETAMINOPHEN 5-325 MG PO TABS: 1.0000 | ORAL_TABLET | ORAL | Status: DC | PRN

## 2015-09-01 NOTE — Telephone Encounter (Signed)
Last OV 06-20-15 Hydrocodone last filled 08-01-15 #90 with 0

## 2015-09-01 NOTE — Telephone Encounter (Signed)
Caller name: Shanon Brow  Relationship to patient: Spouse   Can be reached: [phone]  Reason for call: Pt is requesting a refill on her  hydrocortisone Rx.

## 2015-09-08 ENCOUNTER — Ambulatory Visit (INDEPENDENT_AMBULATORY_CARE_PROVIDER_SITE_OTHER): Payer: Medicare Other | Admitting: General Practice

## 2015-09-08 DIAGNOSIS — Z5181 Encounter for therapeutic drug level monitoring: Secondary | ICD-10-CM | POA: Diagnosis not present

## 2015-09-08 LAB — POCT INR: INR: 3.3

## 2015-09-08 NOTE — Progress Notes (Signed)
Pre visit review using our clinic review tool, if applicable. No additional management support is needed unless otherwise documented below in the visit note. 

## 2015-09-10 NOTE — Progress Notes (Signed)
I agree with this this plan

## 2015-09-14 ENCOUNTER — Encounter: Payer: Self-pay | Admitting: Family Medicine

## 2015-09-14 ENCOUNTER — Other Ambulatory Visit: Payer: Self-pay | Admitting: Family Medicine

## 2015-09-14 ENCOUNTER — Ambulatory Visit (INDEPENDENT_AMBULATORY_CARE_PROVIDER_SITE_OTHER): Payer: Medicare Other | Admitting: Family Medicine

## 2015-09-14 VITALS — BP 136/80 | HR 99 | Temp 97.7°F | Ht 63.0 in | Wt 166.4 lb

## 2015-09-14 DIAGNOSIS — I10 Essential (primary) hypertension: Secondary | ICD-10-CM

## 2015-09-14 DIAGNOSIS — E039 Hypothyroidism, unspecified: Secondary | ICD-10-CM | POA: Diagnosis not present

## 2015-09-14 DIAGNOSIS — Z0000 Encounter for general adult medical examination without abnormal findings: Secondary | ICD-10-CM

## 2015-09-14 DIAGNOSIS — E1169 Type 2 diabetes mellitus with other specified complication: Secondary | ICD-10-CM

## 2015-09-14 DIAGNOSIS — E785 Hyperlipidemia, unspecified: Secondary | ICD-10-CM | POA: Diagnosis not present

## 2015-09-14 DIAGNOSIS — I4891 Unspecified atrial fibrillation: Secondary | ICD-10-CM

## 2015-09-14 DIAGNOSIS — E1140 Type 2 diabetes mellitus with diabetic neuropathy, unspecified: Secondary | ICD-10-CM

## 2015-09-14 DIAGNOSIS — M810 Age-related osteoporosis without current pathological fracture: Secondary | ICD-10-CM | POA: Diagnosis not present

## 2015-09-14 LAB — CBC WITH DIFFERENTIAL/PLATELET
BASOS ABS: 0 10*3/uL (ref 0.0–0.1)
BASOS PCT: 0.5 % (ref 0.0–3.0)
EOS ABS: 0.1 10*3/uL (ref 0.0–0.7)
Eosinophils Relative: 1.2 % (ref 0.0–5.0)
HCT: 45.2 % (ref 36.0–46.0)
Hemoglobin: 14.6 g/dL (ref 12.0–15.0)
LYMPHS ABS: 1.9 10*3/uL (ref 0.7–4.0)
LYMPHS PCT: 30.8 % (ref 12.0–46.0)
MCHC: 32.2 g/dL (ref 30.0–36.0)
MCV: 84.8 fl (ref 78.0–100.0)
MONO ABS: 0.5 10*3/uL (ref 0.1–1.0)
Monocytes Relative: 7.9 % (ref 3.0–12.0)
NEUTROS PCT: 59.6 % (ref 43.0–77.0)
Neutro Abs: 3.7 10*3/uL (ref 1.4–7.7)
PLATELETS: 260 10*3/uL (ref 150.0–400.0)
RBC: 5.33 Mil/uL — ABNORMAL HIGH (ref 3.87–5.11)
RDW: 15.6 % — AB (ref 11.5–15.5)
WBC: 6.2 10*3/uL (ref 4.0–10.5)

## 2015-09-14 LAB — BASIC METABOLIC PANEL
BUN: 21 mg/dL (ref 6–23)
CALCIUM: 10.4 mg/dL (ref 8.4–10.5)
CHLORIDE: 103 meq/L (ref 96–112)
CO2: 31 meq/L (ref 19–32)
Creatinine, Ser: 0.71 mg/dL (ref 0.40–1.20)
GFR: 85.19 mL/min (ref >60.00)
GLUCOSE: 137 mg/dL — AB (ref 70–99)
Potassium: 4.2 mEq/L (ref 3.5–5.1)
SODIUM: 141 meq/L (ref 135–145)

## 2015-09-14 LAB — HEPATIC FUNCTION PANEL
ALK PHOS: 45 U/L (ref 39–117)
ALT: 11 U/L (ref 0–35)
AST: 16 U/L (ref 0–37)
Albumin: 4.2 g/dL (ref 3.5–5.2)
BILIRUBIN DIRECT: 0.1 mg/dL (ref 0.0–0.3)
BILIRUBIN TOTAL: 0.3 mg/dL (ref 0.2–1.2)
TOTAL PROTEIN: 6.8 g/dL (ref 6.0–8.3)

## 2015-09-14 LAB — LIPID PANEL
CHOLESTEROL: 140 mg/dL (ref 0–200)
HDL: 46.4 mg/dL (ref >39.00)
LDL Cholesterol: 65 mg/dL (ref 0–99)
NonHDL: 93.77
TRIGLYCERIDES: 144 mg/dL (ref 0.0–149.0)
Total CHOL/HDL Ratio: 3
VLDL: 28.8 mg/dL (ref 0.0–40.0)

## 2015-09-14 LAB — TSH: TSH: 1.41 u[IU]/mL (ref 0.35–4.50)

## 2015-09-14 LAB — VITAMIN D 25 HYDROXY (VIT D DEFICIENCY, FRACTURES): VITD: 72.06 ng/mL (ref 30.00–100.00)

## 2015-09-14 LAB — HEMOGLOBIN A1C: HEMOGLOBIN A1C: 6.7 % — AB (ref 4.6–6.5)

## 2015-09-14 NOTE — Telephone Encounter (Signed)
Faxed hardcopy for Alprazolam to [place]

## 2015-09-14 NOTE — Telephone Encounter (Signed)
Requesting:  Alprazolam Contract   None UDS  None Last OV  06/20/2015 and most recent scheduled today 09/14/2015 Last Refill    #90 with 1 refill on 08/02/2015  Please Advise

## 2015-09-14 NOTE — Patient Instructions (Signed)
Follow up in 3-4 months to recheck diabetes We'll notify you of your lab results and make any changes if needed You are up to date on colonoscopy until 2020 You have declined mammogram and bone density Keep up the good work!  You look great! Call with any questions or concerns Happy New Year!!!

## 2015-09-14 NOTE — Progress Notes (Signed)
   Subjective:    Patient ID: Sarah Mcguire, female    DOB: 1940/11/09, 75 y.o.   MRN: [id number]  HPI Here today for CPE.  Risk Factors: DM- chronic problem.  On Metformin.  On ACE for renal protection.  UTD on eye exam, foot exam. Afib- chronic problem, following w/ Dr Johnsie Cancel.  On Dilt.  On chronic coumadin HTN- chronic problem, on Dilt, Lisinopril. Hyperlipidemia- chronic problem, on simvastatin and fenofibrate Hypothyroid- ongoing issue.  On levothyroxine Physical Activity: limited  Fall Risk: elevated due to difficulties w/ ambulation Depression: chronic problem, more anxiety than depression Hearing: normal to conversational tones, mildly decreased to whispered voice ADL's: independent Cognitive: normal linear thought process, memory and attention intact Home Safety: safe at home, lives w/ husband Height, Weight, BMI, Visual Acuity: see vitals, vision corrected to 20/20 w/ glasses Counseling: UTD on colonoscopy (due 2020), declines mammo, DEXA.  UTD on flu, Prevnar. Care team reviewed and updated w/ pt. Labs Ordered: See A&P Care Plan: See A&P    Review of Systems Patient reports no vision/ hearing changes, adenopathy,fever, weight change,  persistant/recurrent hoarseness , swallowing issues, chest pain, palpitations, edema, persistant/recurrent cough, hemoptysis, dyspnea (rest/exertional/paroxysmal nocturnal), gastrointestinal bleeding (melena, rectal bleeding), abdominal pain, significant heartburn, bowel changes, GU symptoms (dysuria, hematuria, incontinence), Gyn symptoms (abnormal  bleeding, pain),  syncope, focal weakness, memory loss, skin/hair/nail changes, abnormal bruising or bleeding, anxiety, or depression.   + neuropathy    Objective:   Physical Exam General Appearance:    Alert, cooperative, no distress, appears stated age  Head:    Normocephalic, without obvious abnormality, atraumatic  Eyes:    PERRL, conjunctiva/corneas clear, EOM's intact, fundi    benign,  both eyes  Ears:    Normal TM's and external ear canals, both ears  Nose:   Nares normal, septum midline, mucosa normal, no drainage    or sinus tenderness  Throat:   Lips, mucosa, and tongue normal; teeth and gums normal  Neck:   Supple, symmetrical, trachea midline, no adenopathy;    Thyroid: no enlargement/tenderness/nodules  Back:     Symmetric, no curvature, ROM normal, no CVA tenderness  Lungs:     Clear to auscultation bilaterally, respirations unlabored  Chest Wall:    No tenderness or deformity   Heart:    Regular rate and rhythm, S1 and S2 normal, no murmur, rub   or gallop  Breast Exam:    Deferred at pt's request  Abdomen:     Soft, non-tender, bowel sounds active all four quadrants,    no masses, no organomegaly  Genitalia:    Deferred at pt's request  Rectal:    Extremities:   Extremities normal, atraumatic, no cyanosis or edema  Pulses:   2+ and symmetric all extremities  Skin:   Skin color, texture, turgor normal, no rashes or lesions  Lymph nodes:   Cervical, supraclavicular, and axillary nodes normal  Neurologic:   CNII-XII intact, normal strength, sensation and reflexes    throughout          Assessment & Plan:

## 2015-09-14 NOTE — Progress Notes (Signed)
Pre visit review using our clinic review tool, if applicable. No additional management support is needed unless otherwise documented below in the visit note. 

## 2015-09-28 ENCOUNTER — Other Ambulatory Visit: Payer: Self-pay | Admitting: Family Medicine

## 2015-09-28 NOTE — Telephone Encounter (Signed)
Medication filled to pharmacy as requested.   

## 2015-09-29 ENCOUNTER — Telehealth: Payer: Self-pay | Admitting: Family Medicine

## 2015-09-29 NOTE — Telephone Encounter (Signed)
Caller name: Shanon Brow  Can be reached: [phone]   Reason for call: Refill HYDROcodone-acetaminophen (NORCO/VICODIN) 5-325 MG tablet [id number]

## 2015-10-02 NOTE — Assessment & Plan Note (Signed)
Chronic problem.  Adequate control.  Asymptomatic at this time.  Check labs.  No anticipated med changes. 

## 2015-10-02 NOTE — Assessment & Plan Note (Signed)
Chronic problem.  Tolerating statin and fenofibrate w/o difficulty.  Check labs.  Adjust meds prn  

## 2015-10-02 NOTE — Assessment & Plan Note (Signed)
Chronic problem.  Check labs.  Adjust meds prn  

## 2015-10-02 NOTE — Assessment & Plan Note (Signed)
Chronic problem.  PE unchanged from previous.  UTD on colonoscopy and immunizations.  Declines mammo and DEXA.  Check labs.  Anticipatory guidance provided.

## 2015-10-02 NOTE — Assessment & Plan Note (Signed)
Chronic problem.  Check Vit D level.  Pt refusing DEXA.

## 2015-10-02 NOTE — Assessment & Plan Note (Signed)
Chronic problem.  UTD on eye exam, foot exam.  On ACE for renal protection.  Tolerating metformin w/o difficulty.  Check labs.  Adjust meds prn

## 2015-10-02 NOTE — Assessment & Plan Note (Signed)
Chronic problem.  Following w/ cards.  On coumadin- following w/ coumadin clinic.  On dilt for rate control.  Currently asymptomatic.  Will follow along and assist as able.

## 2015-10-03 ENCOUNTER — Ambulatory Visit (INDEPENDENT_AMBULATORY_CARE_PROVIDER_SITE_OTHER): Payer: Medicare Other | Admitting: General Practice

## 2015-10-03 ENCOUNTER — Ambulatory Visit: Payer: Medicare Other

## 2015-10-03 ENCOUNTER — Other Ambulatory Visit: Payer: Self-pay

## 2015-10-03 DIAGNOSIS — Z5181 Encounter for therapeutic drug level monitoring: Secondary | ICD-10-CM

## 2015-10-03 DIAGNOSIS — I4891 Unspecified atrial fibrillation: Secondary | ICD-10-CM | POA: Diagnosis not present

## 2015-10-03 LAB — POCT INR: INR: 3

## 2015-10-03 MED ORDER — HYDROCODONE-ACETAMINOPHEN 5-325 MG PO TABS: 1.0000 | ORAL_TABLET | ORAL | Status: DC | PRN

## 2015-10-03 NOTE — Progress Notes (Signed)
Pre visit review using our clinic review tool, if applicable. No additional management support is needed unless otherwise documented below in the visit note. 

## 2015-10-03 NOTE — Telephone Encounter (Signed)
Rx printed for signature. Patient will have to pick up at front desk. Left message for patient to return call regarding pick up.

## 2015-10-03 NOTE — Telephone Encounter (Signed)
Ok for 90

## 2015-10-10 ENCOUNTER — Ambulatory Visit (INDEPENDENT_AMBULATORY_CARE_PROVIDER_SITE_OTHER): Payer: Medicare Other | Admitting: Gynecology

## 2015-10-10 ENCOUNTER — Encounter: Payer: Self-pay | Admitting: Gynecology

## 2015-10-10 VITALS — BP 130/82

## 2015-10-10 DIAGNOSIS — N760 Acute vaginitis: Secondary | ICD-10-CM | POA: Diagnosis not present

## 2015-10-10 MED ORDER — NYSTATIN 100000 UNIT/GM EX POWD: CUTANEOUS | Status: DC

## 2015-10-10 NOTE — Patient Instructions (Signed)
Apply the clobetasol cream externally every day as needed for irritation. Use the nystatin antifungal powder externally twice daily as needed.  Follow up if symptoms persist, worsen or recur.

## 2015-10-10 NOTE — Progress Notes (Signed)
Sarah Mcguire [birth year] [id number]        76 y.o.  [id number] Presents with her husband with a long history of chronic recurrent vaginitis.  Has been treated with a variety of regimens all with transient success. Had biopsy of a persistent irritated area right introital opening in July which showed chronic dermatitis but no significant lesions or atypia. Most recently was treated  With Terazol and clobetasol cream notes complete resolution of her symptoms until several days ago. Husband has been applying clobetasol cream externally for last several days due to irritation on her upper inner thighs. Is also applied baby rash OTC products. Notes some improvement in the rash but still with irritation.  Past medical history,surgical history, problem list, medications, allergies, family history and social history were all reviewed and documented in the EPIC chart.  Directed ROS with pertinent positives and negatives documented in the history of present illness/assessment and plan.  Exam: Caryn Bee assistant Filed Vitals:   10/10/15 1450  BP: 130/82   General appearance:  Normal Abdomen soft nontender without masses guarding rebound Pelvic external BUS vagina with desquamating rash upper inner thighs bilaterally. Some peri-vaginal erythema but no discharge. Atrophic changes noted. Bimanual without masses or tenderness  Assessment/Plan:  75 y.o. [id number] with probable fungal upper inner thigh rash improving per her husband's assessment of the last several days using clobetasol. Possible chemical irritation from urine leakage also discussed with them. Need to try to dry after voiding as best they can. Continue with clobetasol externally and then wean after the next 1-2 weeks. Nystatin powder externally twice daily. Hold on Terazol for now or internal vaginal treatment. Follow up if symptoms persist, worsen or recur.    Anastasio Auerbach MD, 3:12 PM 10/10/2015

## 2015-10-17 ENCOUNTER — Ambulatory Visit (INDEPENDENT_AMBULATORY_CARE_PROVIDER_SITE_OTHER): Payer: Medicare Other | Admitting: General Practice

## 2015-10-17 DIAGNOSIS — I4891 Unspecified atrial fibrillation: Secondary | ICD-10-CM | POA: Diagnosis not present

## 2015-10-17 DIAGNOSIS — Z5181 Encounter for therapeutic drug level monitoring: Secondary | ICD-10-CM

## 2015-10-17 LAB — POCT INR: INR: 2

## 2015-10-17 NOTE — Progress Notes (Signed)
Pre visit review using our clinic review tool, if applicable. No additional management support is needed unless otherwise documented below in the visit note. 

## 2015-10-17 NOTE — Progress Notes (Signed)
I agree with this plan.

## 2015-10-24 ENCOUNTER — Ambulatory Visit: Payer: Medicare Other

## 2015-10-24 ENCOUNTER — Other Ambulatory Visit: Payer: Self-pay | Admitting: Family Medicine

## 2015-10-24 NOTE — Telephone Encounter (Signed)
Medication filled to pharmacy as requested.   

## 2015-11-07 ENCOUNTER — Ambulatory Visit (INDEPENDENT_AMBULATORY_CARE_PROVIDER_SITE_OTHER): Payer: Medicare Other | Admitting: General Practice

## 2015-11-07 DIAGNOSIS — Z5181 Encounter for therapeutic drug level monitoring: Secondary | ICD-10-CM

## 2015-11-07 DIAGNOSIS — I4891 Unspecified atrial fibrillation: Secondary | ICD-10-CM | POA: Diagnosis not present

## 2015-11-07 LAB — POCT INR: INR: 1.7

## 2015-11-07 NOTE — Progress Notes (Signed)
I agree with this plan.

## 2015-11-07 NOTE — Progress Notes (Signed)
Pre visit review using our clinic review tool, if applicable. No additional management support is needed unless otherwise documented below in the visit note. 

## 2015-11-23 ENCOUNTER — Other Ambulatory Visit: Payer: Self-pay | Admitting: Family Medicine

## 2015-11-24 NOTE — Telephone Encounter (Signed)
Medication filled to pharmacy as requested.   

## 2015-11-25 ENCOUNTER — Other Ambulatory Visit: Payer: Self-pay | Admitting: Family Medicine

## 2015-11-25 ENCOUNTER — Encounter: Payer: Self-pay | Admitting: Family Medicine

## 2015-11-25 NOTE — Telephone Encounter (Signed)
Last OV 09/14/15 Alprazolam last filled 09/14/15 #90 with 1

## 2015-11-25 NOTE — Telephone Encounter (Signed)
Medication filled to pharmacy as requested.   

## 2015-11-28 ENCOUNTER — Ambulatory Visit (INDEPENDENT_AMBULATORY_CARE_PROVIDER_SITE_OTHER): Payer: Medicare Other | Admitting: General Practice

## 2015-11-28 DIAGNOSIS — Z5181 Encounter for therapeutic drug level monitoring: Secondary | ICD-10-CM

## 2015-11-28 DIAGNOSIS — I4891 Unspecified atrial fibrillation: Secondary | ICD-10-CM

## 2015-11-28 LAB — POCT INR: INR: 1.5

## 2015-11-28 NOTE — Progress Notes (Signed)
Pre visit review using our clinic review tool, if applicable. No additional management support is needed unless otherwise documented below in the visit note. 

## 2015-11-29 NOTE — Progress Notes (Signed)
I agree with this plan.

## 2015-11-30 ENCOUNTER — Other Ambulatory Visit: Payer: Self-pay | Admitting: *Deleted

## 2015-11-30 ENCOUNTER — Other Ambulatory Visit: Payer: Self-pay | Admitting: General Practice

## 2015-11-30 MED ORDER — SIMVASTATIN 20 MG PO TABS: ORAL_TABLET | ORAL | Status: DC

## 2015-11-30 MED ORDER — HYDROCODONE-ACETAMINOPHEN 5-325 MG PO TABS: 1.0000 | ORAL_TABLET | ORAL | Status: DC | PRN

## 2015-11-30 MED ORDER — LISINOPRIL 10 MG PO TABS: 10.0000 mg | ORAL_TABLET | Freq: Every day | ORAL | Status: DC

## 2015-11-30 NOTE — Telephone Encounter (Signed)
Refill sent per [facility] refill protocol/SLS  

## 2015-12-05 ENCOUNTER — Ambulatory Visit (INDEPENDENT_AMBULATORY_CARE_PROVIDER_SITE_OTHER): Payer: Medicare Other | Admitting: General Practice

## 2015-12-05 DIAGNOSIS — I4891 Unspecified atrial fibrillation: Secondary | ICD-10-CM

## 2015-12-05 DIAGNOSIS — Z5181 Encounter for therapeutic drug level monitoring: Secondary | ICD-10-CM | POA: Diagnosis not present

## 2015-12-05 LAB — POCT INR: INR: 2.1

## 2015-12-05 NOTE — Progress Notes (Signed)
I agree with this plan.

## 2015-12-05 NOTE — Progress Notes (Signed)
I agree with this patient

## 2015-12-05 NOTE — Progress Notes (Signed)
Pre visit review using our clinic review tool, if applicable. No additional management support is needed unless otherwise documented below in the visit note. 

## 2015-12-12 ENCOUNTER — Ambulatory Visit (INDEPENDENT_AMBULATORY_CARE_PROVIDER_SITE_OTHER): Payer: Medicare Other | Admitting: General Practice

## 2015-12-12 DIAGNOSIS — I4891 Unspecified atrial fibrillation: Secondary | ICD-10-CM | POA: Diagnosis not present

## 2015-12-12 LAB — POCT INR: INR: 2.7

## 2015-12-12 NOTE — Progress Notes (Signed)
Pre visit review using our clinic review tool, if applicable. No additional management support is needed unless otherwise documented below in the visit note. 

## 2015-12-12 NOTE — Progress Notes (Signed)
I agree with this plan.

## 2015-12-14 ENCOUNTER — Ambulatory Visit: Payer: Medicare Other | Admitting: Family Medicine

## 2015-12-19 ENCOUNTER — Encounter: Payer: Self-pay | Admitting: Family Medicine

## 2015-12-19 ENCOUNTER — Ambulatory Visit (INDEPENDENT_AMBULATORY_CARE_PROVIDER_SITE_OTHER): Payer: Medicare Other | Admitting: Family Medicine

## 2015-12-19 VITALS — BP 122/81 | HR 71 | Temp 98.1°F | Resp 17 | Ht 63.0 in | Wt 164.0 lb

## 2015-12-19 DIAGNOSIS — E1140 Type 2 diabetes mellitus with diabetic neuropathy, unspecified: Secondary | ICD-10-CM | POA: Diagnosis not present

## 2015-12-19 LAB — BASIC METABOLIC PANEL
BUN: 20 mg/dL (ref 6–23)
CO2: 32 meq/L (ref 19–32)
Calcium: 10.2 mg/dL (ref 8.4–10.5)
Chloride: 103 mEq/L (ref 96–112)
Creatinine, Ser: 0.71 mg/dL (ref 0.40–1.20)
GFR: 85.13 mL/min (ref >60.00)
Glucose, Bld: 113 mg/dL — ABNORMAL HIGH (ref 70–99)
Potassium: 4.3 mEq/L (ref 3.5–5.1)
Sodium: 140 mEq/L (ref 135–145)

## 2015-12-19 LAB — HEMOGLOBIN A1C: HEMOGLOBIN A1C: 6.8 % — AB (ref 4.6–6.5)

## 2015-12-19 MED ORDER — LEVOTHYROXINE SODIUM 50 MCG PO TABS: 50.0000 ug | ORAL_TABLET | Freq: Every day | ORAL | Status: DC

## 2015-12-19 MED ORDER — GLUCOSE BLOOD VI STRP: ORAL_STRIP | Status: DC

## 2015-12-19 MED ORDER — HYDROCODONE-ACETAMINOPHEN 5-325 MG PO TABS: 1.0000 | ORAL_TABLET | ORAL | Status: DC | PRN

## 2015-12-19 NOTE — Assessment & Plan Note (Signed)
Chronic problem.  Currently asymptomatic w/ exception of known neuropathy.  UTD on eye exam, foot exam done today.  On ACE for renal protection.  Check labs.  Adjust meds prn.

## 2015-12-19 NOTE — Patient Instructions (Signed)
Follow up in 3 months to recheck diabetes and cholesterol We'll notify you of your lab results and make any changes if needed Keep up the good work!  You look great!!! Call with any questions or concerns Happy Spring!!!

## 2015-12-19 NOTE — Progress Notes (Signed)
Pre visit review using our clinic review tool, if applicable. No additional management support is needed unless otherwise documented below in the visit note. 

## 2015-12-19 NOTE — Progress Notes (Signed)
   Subjective:    Patient ID: Sarah Mcguire, female    DOB: 1941-07-19, 75 y.o.   MRN: [id number]  HPI DM- chronic problem, on Metformin daily.  On ACE for renal protection.  UTD on eye exam (June), due for foot exam.  Pt will have symptomatic lows if she doesn't have an afternoon snack.  Denies CP, SOB, HAs, visual changes, abd pain, N/V.  + numbness of feet.   Review of Systems For ROS see HPI     Objective:   Physical Exam  Constitutional: She is oriented to person, place, and time. She appears well-developed and well-nourished. No distress.  HENT:  Head: Normocephalic and atraumatic.  Eyes: Conjunctivae and EOM are normal. Pupils are equal, round, and reactive to light.  Neck: Normal range of motion. Neck supple. No thyromegaly present.  Cardiovascular: Normal rate, regular rhythm, normal heart sounds and intact distal pulses.   No murmur heard. Pulmonary/Chest: Effort normal and breath sounds normal. No respiratory distress.  Abdominal: Soft. She exhibits no distension. There is no tenderness.  Musculoskeletal: She exhibits no edema.  Lymphadenopathy:    She has no cervical adenopathy.  Neurological: She is alert and oriented to person, place, and time.  Skin: Skin is warm and dry.  Psychiatric: She has a normal mood and affect. Her behavior is normal.  Vitals reviewed.         Assessment & Plan:

## 2015-12-21 ENCOUNTER — Ambulatory Visit (INDEPENDENT_AMBULATORY_CARE_PROVIDER_SITE_OTHER): Payer: Medicare Other | Admitting: General Practice

## 2015-12-21 DIAGNOSIS — I4891 Unspecified atrial fibrillation: Secondary | ICD-10-CM

## 2015-12-21 DIAGNOSIS — Z5181 Encounter for therapeutic drug level monitoring: Secondary | ICD-10-CM | POA: Diagnosis not present

## 2015-12-21 LAB — POCT INR: INR: 2.9

## 2015-12-21 NOTE — Progress Notes (Signed)
Pre visit review using our clinic review tool, if applicable. No additional management support is needed unless otherwise documented below in the visit note. 

## 2015-12-21 NOTE — Progress Notes (Signed)
I have reviewed and agree with the plan. 

## 2015-12-29 DIAGNOSIS — H5203 Hypermetropia, bilateral: Secondary | ICD-10-CM | POA: Diagnosis not present

## 2016-01-09 ENCOUNTER — Other Ambulatory Visit: Payer: Self-pay | Admitting: General Practice

## 2016-01-09 ENCOUNTER — Ambulatory Visit (INDEPENDENT_AMBULATORY_CARE_PROVIDER_SITE_OTHER): Payer: Medicare Other | Admitting: General Practice

## 2016-01-09 DIAGNOSIS — Z5181 Encounter for therapeutic drug level monitoring: Secondary | ICD-10-CM | POA: Diagnosis not present

## 2016-01-09 DIAGNOSIS — I4891 Unspecified atrial fibrillation: Secondary | ICD-10-CM

## 2016-01-09 LAB — POCT INR: INR: 3.8

## 2016-01-09 MED ORDER — WARFARIN SODIUM 1 MG PO TABS: 1.0000 mg | ORAL_TABLET | Freq: Every day | ORAL | Status: DC

## 2016-01-09 NOTE — Progress Notes (Signed)
Pre visit review using our clinic review tool, if applicable. No additional management support is needed unless otherwise documented below in the visit note. 

## 2016-01-10 LAB — HM DIABETES EYE EXAM

## 2016-01-10 NOTE — Progress Notes (Signed)
I agree with this plan.

## 2016-01-17 ENCOUNTER — Encounter: Payer: Self-pay | Admitting: General Practice

## 2016-01-17 ENCOUNTER — Other Ambulatory Visit: Payer: Self-pay | Admitting: Gynecology

## 2016-01-17 NOTE — Telephone Encounter (Signed)
Last filled on 07/31/16 annual visit

## 2016-01-20 ENCOUNTER — Encounter: Payer: Self-pay | Admitting: Family Medicine

## 2016-01-20 MED ORDER — ALPRAZOLAM 2 MG PO TABS: ORAL_TABLET | ORAL | Status: DC

## 2016-01-20 NOTE — Telephone Encounter (Signed)
Last OV 12/19/15 Alprazolam last filled 11/25/15 #90 with 1  Med not due until next Wednesday, please advise?

## 2016-01-20 NOTE — Telephone Encounter (Signed)
Medication filled to pharmacy as requested.   

## 2016-01-23 ENCOUNTER — Ambulatory Visit (INDEPENDENT_AMBULATORY_CARE_PROVIDER_SITE_OTHER): Payer: Medicare Other | Admitting: General Practice

## 2016-01-23 ENCOUNTER — Other Ambulatory Visit: Payer: Self-pay | Admitting: Cardiovascular Disease

## 2016-01-23 ENCOUNTER — Other Ambulatory Visit: Payer: Self-pay | Admitting: General Practice

## 2016-01-23 DIAGNOSIS — Z5181 Encounter for therapeutic drug level monitoring: Secondary | ICD-10-CM

## 2016-01-23 DIAGNOSIS — I4891 Unspecified atrial fibrillation: Secondary | ICD-10-CM

## 2016-01-23 LAB — POCT INR: INR: 2.7

## 2016-01-23 MED ORDER — METFORMIN HCL ER 500 MG PO TB24: ORAL_TABLET | ORAL | Status: DC

## 2016-01-23 NOTE — Progress Notes (Signed)
Pre visit review using our clinic review tool, if applicable. No additional management support is needed unless otherwise documented below in the visit note. 

## 2016-01-24 NOTE — Progress Notes (Signed)
I agree with this plan.

## 2016-02-06 ENCOUNTER — Ambulatory Visit (INDEPENDENT_AMBULATORY_CARE_PROVIDER_SITE_OTHER): Payer: Medicare Other | Admitting: General Practice

## 2016-02-06 DIAGNOSIS — Z5181 Encounter for therapeutic drug level monitoring: Secondary | ICD-10-CM

## 2016-02-06 DIAGNOSIS — I4891 Unspecified atrial fibrillation: Secondary | ICD-10-CM | POA: Diagnosis not present

## 2016-02-06 LAB — POCT INR: INR: 2.2

## 2016-02-06 NOTE — Progress Notes (Signed)
Pre visit review using our clinic review tool, if applicable. No additional management support is needed unless otherwise documented below in the visit note. 

## 2016-02-07 NOTE — Progress Notes (Signed)
I agree with this plan.

## 2016-02-20 ENCOUNTER — Ambulatory Visit (INDEPENDENT_AMBULATORY_CARE_PROVIDER_SITE_OTHER): Payer: Medicare Other | Admitting: General Practice

## 2016-02-20 DIAGNOSIS — I4891 Unspecified atrial fibrillation: Secondary | ICD-10-CM | POA: Diagnosis not present

## 2016-02-20 DIAGNOSIS — Z5181 Encounter for therapeutic drug level monitoring: Secondary | ICD-10-CM

## 2016-02-20 LAB — POCT INR: INR: 2.9

## 2016-02-20 NOTE — Progress Notes (Signed)
Pre visit review using our clinic review tool, if applicable. No additional management support is needed unless otherwise documented below in the visit note. 

## 2016-02-21 NOTE — Progress Notes (Signed)
I agree with this plan.

## 2016-02-24 ENCOUNTER — Other Ambulatory Visit: Payer: Self-pay | Admitting: Gynecology

## 2016-02-28 ENCOUNTER — Other Ambulatory Visit: Payer: Self-pay | Admitting: General Practice

## 2016-02-28 MED ORDER — WARFARIN SODIUM 1 MG PO TABS: 1.0000 mg | ORAL_TABLET | Freq: Every day | ORAL | Status: DC

## 2016-03-02 ENCOUNTER — Other Ambulatory Visit: Payer: Self-pay | Admitting: General Practice

## 2016-03-02 MED ORDER — HYDROCODONE-ACETAMINOPHEN 5-325 MG PO TABS: 1.0000 | ORAL_TABLET | ORAL | Status: DC | PRN

## 2016-03-12 ENCOUNTER — Ambulatory Visit (INDEPENDENT_AMBULATORY_CARE_PROVIDER_SITE_OTHER): Payer: Medicare Other | Admitting: General Practice

## 2016-03-12 ENCOUNTER — Other Ambulatory Visit: Payer: Self-pay | Admitting: General Practice

## 2016-03-12 DIAGNOSIS — Z5181 Encounter for therapeutic drug level monitoring: Secondary | ICD-10-CM | POA: Diagnosis not present

## 2016-03-12 DIAGNOSIS — I4891 Unspecified atrial fibrillation: Secondary | ICD-10-CM | POA: Diagnosis not present

## 2016-03-12 LAB — POCT INR: INR: 3.4

## 2016-03-12 NOTE — Progress Notes (Signed)
Pre visit review using our clinic review tool, if applicable. No additional management support is needed unless otherwise documented below in the visit note. 

## 2016-03-12 NOTE — Progress Notes (Signed)
I agree with this plan.

## 2016-03-14 ENCOUNTER — Encounter: Payer: Self-pay | Admitting: Family Medicine

## 2016-03-14 ENCOUNTER — Ambulatory Visit (INDEPENDENT_AMBULATORY_CARE_PROVIDER_SITE_OTHER): Payer: Medicare Other | Admitting: Family Medicine

## 2016-03-14 VITALS — BP 121/80 | HR 90 | Temp 98.0°F | Resp 17 | Ht 63.0 in | Wt 158.0 lb

## 2016-03-14 DIAGNOSIS — E1169 Type 2 diabetes mellitus with other specified complication: Secondary | ICD-10-CM | POA: Diagnosis not present

## 2016-03-14 DIAGNOSIS — E785 Hyperlipidemia, unspecified: Secondary | ICD-10-CM

## 2016-03-14 DIAGNOSIS — I10 Essential (primary) hypertension: Secondary | ICD-10-CM

## 2016-03-14 DIAGNOSIS — E1140 Type 2 diabetes mellitus with diabetic neuropathy, unspecified: Secondary | ICD-10-CM | POA: Diagnosis not present

## 2016-03-14 LAB — CBC WITH DIFFERENTIAL/PLATELET
BASOS PCT: 0.6 % (ref 0.0–3.0)
Basophils Absolute: 0 10*3/uL (ref 0.0–0.1)
EOS ABS: 0.1 10*3/uL (ref 0.0–0.7)
EOS PCT: 1.5 % (ref 0.0–5.0)
HCT: 42.6 % (ref 36.0–46.0)
HEMOGLOBIN: 13.9 g/dL (ref 12.0–15.0)
LYMPHS ABS: 1.9 10*3/uL (ref 0.7–4.0)
Lymphocytes Relative: 24.5 % (ref 12.0–46.0)
MCHC: 32.5 g/dL (ref 30.0–36.0)
MCV: 84.6 fl (ref 78.0–100.0)
MONO ABS: 0.7 10*3/uL (ref 0.1–1.0)
Monocytes Relative: 9 % (ref 3.0–12.0)
NEUTROS ABS: 4.9 10*3/uL (ref 1.4–7.7)
Neutrophils Relative %: 64.4 % (ref 43.0–77.0)
PLATELETS: 268 10*3/uL (ref 150.0–400.0)
RBC: 5.04 Mil/uL (ref 3.87–5.11)
RDW: 16.1 % — AB (ref 11.5–15.5)
WBC: 7.6 10*3/uL (ref 4.0–10.5)

## 2016-03-14 LAB — BASIC METABOLIC PANEL
BUN: 19 mg/dL (ref 6–23)
CALCIUM: 10.5 mg/dL (ref 8.4–10.5)
CO2: 30 meq/L (ref 19–32)
CREATININE: 0.7 mg/dL (ref 0.40–1.20)
Chloride: 104 mEq/L (ref 96–112)
GFR: 86.48 mL/min (ref >60.00)
Glucose, Bld: 84 mg/dL (ref 70–99)
Potassium: 4.5 mEq/L (ref 3.5–5.1)
SODIUM: 141 meq/L (ref 135–145)

## 2016-03-14 LAB — HEPATIC FUNCTION PANEL
ALK PHOS: 45 U/L (ref 39–117)
ALT: 14 U/L (ref 0–35)
AST: 16 U/L (ref 0–37)
Albumin: 4.1 g/dL (ref 3.5–5.2)
BILIRUBIN DIRECT: 0.1 mg/dL (ref 0.0–0.3)
BILIRUBIN TOTAL: 0.3 mg/dL (ref 0.2–1.2)
TOTAL PROTEIN: 6.7 g/dL (ref 6.0–8.3)

## 2016-03-14 LAB — LIPID PANEL
CHOLESTEROL: 147 mg/dL (ref 0–200)
HDL: 55.9 mg/dL (ref >39.00)
LDL CALC: 67 mg/dL (ref 0–99)
NonHDL: 91.58
TRIGLYCERIDES: 125 mg/dL (ref 0.0–149.0)
Total CHOL/HDL Ratio: 3
VLDL: 25 mg/dL (ref 0.0–40.0)

## 2016-03-14 LAB — TSH: TSH: 2.05 u[IU]/mL (ref 0.35–4.50)

## 2016-03-14 LAB — HEMOGLOBIN A1C: HEMOGLOBIN A1C: 6.7 % — AB (ref 4.6–6.5)

## 2016-03-14 NOTE — Assessment & Plan Note (Signed)
Chronic problem.  Tolerating Metformin w/o difficulty.  UTD on eye exam, foot exam.  On ACE for renal protection.  Check labs.  Adjust meds prn  

## 2016-03-14 NOTE — Assessment & Plan Note (Signed)
Chronic problem.  Well controlled.  Asymptomatic.  Check labs.  No anticipated med changes. 

## 2016-03-14 NOTE — Assessment & Plan Note (Signed)
Chronic problem.  Tolerating statin w/o difficulty.  Applauded her recent weight loss.  Check labs.  Adjust meds prn  

## 2016-03-14 NOTE — Progress Notes (Signed)
   Subjective:    Patient ID: Sarah Mcguire, female    DOB: [birth year], 75 y.o.   MRN: [id number]  HPI DM- chronic problem, on Metformin 2 tabs in the AM and 1 qPM.  On ACE for renal protection.  UTD on foot exam, eye exam.  Pt has lost 6 lbs since last visit.  Denies symptomatic lows.  Pt has known neuropathy but pt reports R leg is actually improving  HTN- chronic problem, on Dilt, Lasix, Lisinopril w/ good control.  No CP, SOB, HAs, visual changes.  Hyperlipidemia- chronic problem, on Simvastatin, fenofibrate.  No abd pain, N/V.   Review of Systems For ROS see HPI     Objective:   Physical Exam  Constitutional: She is oriented to person, place, and time. She appears well-developed and well-nourished. No distress.  HENT:  Head: Normocephalic and atraumatic.  Eyes: Conjunctivae and EOM are normal. Pupils are equal, round, and reactive to light.  Neck: Normal range of motion. Neck supple. No thyromegaly present.  Cardiovascular: Normal rate, normal heart sounds and intact distal pulses.   No murmur heard. Irregular S1/S2  Pulmonary/Chest: Effort normal and breath sounds normal. No respiratory distress.  Abdominal: Soft. She exhibits no distension. There is no tenderness.  Musculoskeletal: She exhibits edema (trace LE edema bilaterally).  Lymphadenopathy:    She has no cervical adenopathy.  Neurological: She is alert and oriented to person, place, and time.  Skin: Skin is warm and dry.  Psychiatric: She has a normal mood and affect. Her behavior is normal.  Vitals reviewed.         Assessment & Plan:

## 2016-03-14 NOTE — Patient Instructions (Signed)
Follow up in 3-4 months to recheck sugars We'll notify you of your lab results and make any changes if needed Keep up the good work on healthy diet and regular activity- you look great! Call with any questions or concerns Have a great summer!!!

## 2016-03-14 NOTE — Progress Notes (Signed)
Pre visit review using our clinic review tool, if applicable. No additional management support is needed unless otherwise documented below in the visit note. 

## 2016-03-15 ENCOUNTER — Encounter: Payer: Self-pay | Admitting: Family Medicine

## 2016-03-16 ENCOUNTER — Other Ambulatory Visit: Payer: Self-pay | Admitting: General Practice

## 2016-03-16 MED ORDER — ALPRAZOLAM 2 MG PO TABS: ORAL_TABLET | ORAL | Status: DC

## 2016-03-16 NOTE — Telephone Encounter (Signed)
Medication filled to pharmacy as requested.   

## 2016-03-16 NOTE — Telephone Encounter (Signed)
Last OV 03/14/16 Alprazolam last filled 01/20/16 #90 with 1

## 2016-03-18 ENCOUNTER — Emergency Department (HOSPITAL_COMMUNITY): Payer: Medicare Other

## 2016-03-18 ENCOUNTER — Inpatient Hospital Stay (HOSPITAL_COMMUNITY)
Admission: EM | Admit: 2016-03-18 | Discharge: 2016-03-20 | DRG: 190 | Disposition: A | Discharge status: Home Health | Payer: Medicare Other | Attending: Internal Medicine | Admitting: Internal Medicine

## 2016-03-18 ENCOUNTER — Encounter (HOSPITAL_COMMUNITY): Payer: Self-pay | Admitting: Emergency Medicine

## 2016-03-18 DIAGNOSIS — E785 Hyperlipidemia, unspecified: Secondary | ICD-10-CM | POA: Diagnosis present

## 2016-03-18 DIAGNOSIS — I480 Paroxysmal atrial fibrillation: Secondary | ICD-10-CM | POA: Diagnosis present

## 2016-03-18 DIAGNOSIS — Z86718 Personal history of other venous thrombosis and embolism: Secondary | ICD-10-CM | POA: Diagnosis not present

## 2016-03-18 DIAGNOSIS — R0902 Hypoxemia: Secondary | ICD-10-CM | POA: Diagnosis not present

## 2016-03-18 DIAGNOSIS — Z96652 Presence of left artificial knee joint: Secondary | ICD-10-CM | POA: Diagnosis present

## 2016-03-18 DIAGNOSIS — Z880 Allergy status to penicillin: Secondary | ICD-10-CM | POA: Diagnosis not present

## 2016-03-18 DIAGNOSIS — Z79899 Other long term (current) drug therapy: Secondary | ICD-10-CM

## 2016-03-18 DIAGNOSIS — Z86711 Personal history of pulmonary embolism: Secondary | ICD-10-CM

## 2016-03-18 DIAGNOSIS — Z833 Family history of diabetes mellitus: Secondary | ICD-10-CM | POA: Diagnosis not present

## 2016-03-18 DIAGNOSIS — J441 Chronic obstructive pulmonary disease with (acute) exacerbation: Principal | ICD-10-CM | POA: Diagnosis present

## 2016-03-18 DIAGNOSIS — E119 Type 2 diabetes mellitus without complications: Secondary | ICD-10-CM | POA: Diagnosis not present

## 2016-03-18 DIAGNOSIS — Z881 Allergy status to other antibiotic agents status: Secondary | ICD-10-CM | POA: Diagnosis not present

## 2016-03-18 DIAGNOSIS — Z7901 Long term (current) use of anticoagulants: Secondary | ICD-10-CM | POA: Diagnosis not present

## 2016-03-18 DIAGNOSIS — J439 Emphysema, unspecified: Secondary | ICD-10-CM | POA: Diagnosis not present

## 2016-03-18 DIAGNOSIS — F419 Anxiety disorder, unspecified: Secondary | ICD-10-CM | POA: Diagnosis present

## 2016-03-18 DIAGNOSIS — Z885 Allergy status to narcotic agent status: Secondary | ICD-10-CM | POA: Diagnosis not present

## 2016-03-18 DIAGNOSIS — J9611 Chronic respiratory failure with hypoxia: Secondary | ICD-10-CM | POA: Diagnosis present

## 2016-03-18 DIAGNOSIS — T1490 Injury, unspecified: Secondary | ICD-10-CM | POA: Diagnosis not present

## 2016-03-18 DIAGNOSIS — J9601 Acute respiratory failure with hypoxia: Secondary | ICD-10-CM | POA: Diagnosis present

## 2016-03-18 DIAGNOSIS — T148 Other injury of unspecified body region: Secondary | ICD-10-CM | POA: Diagnosis not present

## 2016-03-18 DIAGNOSIS — I10 Essential (primary) hypertension: Secondary | ICD-10-CM | POA: Diagnosis not present

## 2016-03-18 DIAGNOSIS — Z888 Allergy status to other drugs, medicaments and biological substances status: Secondary | ICD-10-CM | POA: Diagnosis not present

## 2016-03-18 DIAGNOSIS — Z8249 Family history of ischemic heart disease and other diseases of the circulatory system: Secondary | ICD-10-CM

## 2016-03-18 DIAGNOSIS — W010XXA Fall on same level from slipping, tripping and stumbling without subsequent striking against object, initial encounter: Secondary | ICD-10-CM | POA: Diagnosis present

## 2016-03-18 DIAGNOSIS — K219 Gastro-esophageal reflux disease without esophagitis: Secondary | ICD-10-CM | POA: Diagnosis not present

## 2016-03-18 DIAGNOSIS — Y92009 Unspecified place in unspecified non-institutional (private) residence as the place of occurrence of the external cause: Secondary | ICD-10-CM

## 2016-03-18 DIAGNOSIS — Z7984 Long term (current) use of oral hypoglycemic drugs: Secondary | ICD-10-CM | POA: Diagnosis not present

## 2016-03-18 DIAGNOSIS — F17200 Nicotine dependence, unspecified, uncomplicated: Secondary | ICD-10-CM | POA: Diagnosis present

## 2016-03-18 MED ORDER — PREDNISONE 20 MG PO TABS
60.0000 mg | ORAL_TABLET | Freq: Once | ORAL | Status: AC
  Administered 2016-03-19: 60 mg via ORAL
  Filled 2016-03-18: qty 3

## 2016-03-18 MED ORDER — IPRATROPIUM-ALBUTEROL 0.5-2.5 (3) MG/3ML IN SOLN
3.0000 mL | Freq: Once | RESPIRATORY_TRACT | Status: AC
  Administered 2016-03-19: 3 mL via RESPIRATORY_TRACT
  Filled 2016-03-18: qty 3

## 2016-03-18 NOTE — ED Notes (Signed)
Bed: [id number] Expected date:  Expected time:  Means of arrival:  Comments: EMS 75 yo female fall/atrial fib/sat 80's/rate 100's

## 2016-03-18 NOTE — ED Provider Notes (Signed)
CSN: [id number]     Arrival date & time 03/18/16  2255 History  By signing my name below, I, Altamease Oiler, attest that this documentation has been prepared under the direction and in the presence of Merryl Hacker, MD. Electronically Signed: Altamease Oiler, ED Scribe. 03/18/2016. 11:46 PM   Chief Complaint  Patient presents with  . Fall  . COPD  . Hypoxic    The history is provided by the patient. No language interpreter was used.   Brought in by EMS, Sarah Mcguire is a 75 y.o. female with PMHx of a-fib on warfarin, HTN, DM, and HLD who presents to the Emergency Department for evaluation after a fall at home tonight. Pt states that she got her feet twisted while using her walker with assistance and slid to the ground on her buttocks. Pt did not strike her head or lose consciousness. She was unable to get up off of the floor unassisted but this is not new since her knee replacement in 2014. Pt denies any new extremity or other pain since the fall.  On EMS arrival the pt was noted to be hypoxic at 88% on 2L. She does not wear oxygen at home.  Pt denies chest pain, fever, cough, and SOB. The pt and her family report that her oxygen saturation has been near 90% over the last few days when it is typically near 93%. Pt is a smoker but denies history of COPD.   Past Medical History  Diagnosis Date  . Hyperlipidemia   . Hypertension   . Hyperglycemia   . GERD (gastroesophageal reflux disease)   . Anemia   . Leukocytosis   . Diverticulitis   . Neuropathy ([facility])   . Anxiety     SEVERE  . 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 DVT (deep venous thrombosis) ([place])   . Diabetes mellitus     borderline no medications  . Arthritis   . Atrial fibrillation with RVR ([place]) 11/28/2012   Past Surgical History  Procedure Laterality Date  . Tonsillectomy    . Tympanostomy tube placement       R ear  . 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 Diabetes Mother   . Hypertension Mother   . Heart disease Father     MI age 71's   . Diabetes Brother   . Hypertension Brother    Social History  Substance Use Topics  . Smoking status: Current Every Day Smoker -- 0.80 packs/day    Types: Cigarettes  . Smokeless tobacco: Never Used  . Alcohol Use: No   OB History    Gravida Para Term Preterm AB TAB SAB Ectopic Multiple Living   2 2 2       2      Review of Systems  Constitutional: Negative for fever.  Respiratory: Negative for cough and shortness of breath.   Cardiovascular: Negative for chest pain.  Genitourinary: Negative for difficulty urinating.  Musculoskeletal: Negative for myalgias and arthralgias.  Neurological: Negative for dizziness and syncope.  All other systems reviewed and are negative.   Allergies  Morphine and related; Amoxicillin; Amoxicillin-pot clavulanate; Citalopram hydrobromide; Clonazepam; Cymbalta; Macrobid; Penicillins; Pyridium; Zonegran; Zonisamide; Sertraline hcl; and Wellbutrin  Home Medications   Prior to Admission medications   Medication Sig Start Date End Date Taking? Authorizing Provider  acetaminophen (TYLENOL) 325 MG tablet Take 325-650 mg by mouth every 6 (six) hours as needed (for headache).    Yes Historical Provider, MD  alprazolam (XANAX) 2 MG tablet TAKE 1/2-1 TABLET BY MOUTH 3 TIMES A DAY AS NEEDED FOR SLEEP OR ANXIETY Patient taking differently: Take 1 mg by mouth 6 (six) times daily.  03/16/16  Yes Midge Minium, MD  Cholecalciferol (VITAMIN D) 2000 UNITS tablet Take 2,000 Units by mouth daily.   Yes Historical Provider, MD  clobetasol cream (TEMOVATE) 0.05 % APPLY EXTERNALLY AT BEDTIME FOR ONE MONTH THEN AS NEEDED FOR ITCHING 02/24/16  Yes Timothy P  Fontaine, MD  diltiazem (CARDIZEM CD) 120 MG 24 hr capsule TAKE 1 CAPSULE (120 MG TOTAL) BY MOUTH DAILY. 01/23/16  Yes Josue Hector, MD  docusate sodium (COLACE) 100 MG capsule Take 100 mg by mouth 2 (two) times daily as needed for mild constipation.   Yes Historical Provider, MD  esomeprazole (NEXIUM) 40 MG capsule Take 1 capsule (40 mg total) by mouth daily as needed. For acid reflux. Patient taking differently: Take 40 mg by mouth daily before breakfast. For acid reflux. 05/24/15  Yes Midge Minium, MD  fenofibrate 160 MG tablet TAKE 1 TABLET BY MOUTH EVERY DAY 09/28/15  Yes Midge Minium, MD  folic acid (FOLVITE) 1 MG tablet Take 1 tablet (1 mg total) by mouth daily. 05/31/14  Yes Midge Minium, MD  furosemide (LASIX) 40 MG tablet Take 1 tablet (40 mg total) by mouth daily. Patient taking differently: Take 10 mg by mouth daily with breakfast.  11/08/14  Yes Midge Minium, MD  glucose blood (ONE TOUCH ULTRA TEST) test strip TEST TWICE A DAY AS DIRECTED 12/19/15  Yes Midge Minium, MD  HYDROcodone-acetaminophen (NORCO/VICODIN) 5-325 MG tablet Take 1-2 tablets by mouth every 4 (four) hours as needed. Patient taking differently: Take 0.5 tablets by mouth every 6 (six) hours as needed.  03/02/16  Yes Midge Minium, MD  levothyroxine (SYNTHROID, LEVOTHROID) 50 MCG tablet Take 1 tablet (50 mcg total) by mouth daily. Patient taking differently: Take 50 mcg by mouth daily before breakfast.  12/19/15  Yes Midge Minium, MD  lidocaine (LIDODERM) 5 % Place 1 patch onto the skin daily as needed (for pain.). Remove & Discard patch within 12 hours or as directed by MD   Yes Historical Provider, MD  lidocaine (XYLOCAINE) 5 % ointment APPLY 1 APPLICATION TOPICALLY 3 (THREE) TIMES DAILY AS NEEDED. FOR ITCHING DIRECTLY TO THE SPOT 02/24/16  Yes Anastasio Auerbach, MD  lisinopril (PRINIVIL,ZESTRIL) 10 MG tablet Take 1 tablet (10 mg total) by mouth daily. 11/30/15  Yes Midge Minium, MD   metFORMIN (GLUCOPHAGE-XR) 500 MG 24 hr tablet TAKE 2 TABLETS BY MOUTH IN THE MORNING WITH FOOD AND 1 TABLET IN THE EVENING WITH FOOD. 01/23/16  Yes Midge Minium, MD  Multiple Vitamin (MULTIVITAMIN) tablet Take 1 tablet by mouth daily.     Yes Historical Provider, MD  nystatin (MYCOSTATIN/NYSTOP) 100000 UNIT/GM POWD Apply to affected area twice daily Patient taking differently: Apply 1 g topically 2 (two) times daily as needed (for irritation of skin folds). Apply to affected area twice daily 10/10/15  Yes Anastasio Auerbach, MD  simvastatin (ZOCOR) 20 MG tablet TAKE 1 TABLET (20 MG TOTAL) BY MOUTH EVERY EVENING. 11/30/15  Yes Midge Minium, MD  warfarin (COUMADIN) 5 MG tablet TAKE 1 TABLET ON TUES, THURS, SATURDAY AND SUNDAY, TAKE 1/2 TAB ON REMAINING DAYS Patient taking differently: TAKE 1 TABLET (5 MG) ON TUES, THURS, SATURDAY AND SUNDAY, TAKE 1/2 TAB (2.5 MG) ON MON and WED, no pill on FRIDAYS 11/24/15  Yes Midge Minium, MD  hydrocortisone-pramoxine [facility]) 2.5-1 % rectal cream Place 1 application rectally 3 (three) times daily as needed. Patient not taking: Reported on 03/18/2016 09/01/15   Midge Minium, MD  terconazole (TERAZOL 7) 0.4 % vaginal cream Place 1 applicator vaginally at bedtime. For 7 nights Patient not taking: Reported on 03/18/2016 08/01/15   Anastasio Auerbach, MD   BP 125/88 mmHg  Pulse 108  Temp(Src) 98.2 F (36.8 C) (Oral)  Resp 24  SpO2 92%  LMP  (LMP Unknown) Physical Exam  Constitutional: She is oriented to person, place, and time. She appears well-developed and well-nourished. No distress.  HENT:  Head: Normocephalic and atraumatic.  Mouth/Throat: Oropharynx is clear and moist.  Eyes: Pupils are equal, round, and reactive to light.  Neck: Normal range of motion. Neck supple.  No midline c-spine tenderness  Cardiovascular: Normal heart sounds.   Tachycardia  Pulmonary/Chest: Effort normal. No respiratory distress. She has no wheezes.   Diminished in all lung fields  Abdominal: Soft. Bowel sounds are normal. There is no tenderness. There is no rebound.  Musculoskeletal: She exhibits no edema.  Normal ROM all 4 extremities; no obvious deformities  Neurological: She is alert and oriented to person, place, and time.  Skin: Skin is warm and dry.  Psychiatric: She has a normal mood and affect.  Nursing note and vitals reviewed.   ED Course  Procedures (including critical care time)  COORDINATION OF CARE: 11:43 PM Discussed treatment plan which includes lab work, CXR, EKG, and a breathing treatment with pt at bedside and pt agreed to plan.  Labs Review Labs Reviewed  CBC WITH DIFFERENTIAL/PLATELET - Abnormal; Notable for the following:    RDW 15.6 (*)    All other components within normal limits  BASIC METABOLIC PANEL - Abnormal; Notable for the following:    Glucose, Bld 137 (*)    BUN 22 (*)    All other components within normal limits  BRAIN NATRIURETIC PEPTIDE - Abnormal; Notable for the following:    B Natriuretic Peptide 184.9 (*)    All other components within normal limits  PROTIME-INR - Abnormal; Notable for the following:    Prothrombin Time 24.4 (*)    INR 2.31 (*)    All other components within normal limits  D-DIMER, QUANTITATIVE (NOT AT [facility]) - Abnormal; Notable for the following:    D-Dimer, Quant 8.73 (*)    All other components within normal limits    Imaging Review Dg Chest 2 View  03/19/2016  CLINICAL DATA:  Hypoxia EXAM: CHEST  2 VIEW COMPARISON:  05/23/2015 FINDINGS: Chronic cardiopericardial enlargement. Chronic hyperinflation with flat diaphragm. Stable increased markings at the bases with reticular pattern greater on the right. Patient has emphysema by 2013 chest CT. There is no edema, consolidation, effusion, or pneumothorax. 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 IMPRESSION: 1. No acute finding 2.  Emphysema. (ICD10-J43.9) 3. Stable cardiomegaly.  Electronically Signed   By: Monte Fantasia M.D.   On: 03/19/2016 01:57   Ct Angio Chest Pe W/cm &/or Wo Cm  03/19/2016  CLINICAL DATA:  Hypoxia.  Status post fall. EXAM: CT ANGIOGRAPHY CHEST WITH CONTRAST TECHNIQUE: Multidetector CT imaging  of the chest was performed using the standard protocol during bolus administration of intravenous contrast. Multiplanar CT image reconstructions and MIPs were obtained to evaluate the vascular anatomy. CONTRAST:  100 mL of Isovue 370 COMPARISON:  June 26, 2012 FINDINGS: Central airways are normal. No pneumothorax. Emphysematous changes are seen within the lungs. Dependent bibasilar atelectasis also noted. No suspicious pulmonary nodules, masses, or focal infiltrates. The ascending thoracic aorta measures up to 4.2 cm in AP diameter. No dissection. Atherosclerotic change seen within the thoracic aorta. There are a few shotty nodes in the AP window and mediastinum, likely reactive. No new enlarged nodes identified. No pulmonary emboli. Evaluation of the the upper abdomen is unremarkable Degenerative changes in the spine.  No other bony abnormalities. Review of the MIP images confirms the above findings. IMPRESSION: 1. No pulmonary emboli. 2. Emphysema. 3. 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 4. Increased prominence as shotty mediastinal nodes, likely reactive. Recommend attention on follow-up. Electronically Signed   By: Dorise Bullion III M.D   On: 03/19/2016 03:56   I have personally reviewed and evaluated these images and lab results as part of my medical decision-making.   EKG Interpretation   Date/Time:  Sunday March 18 2016 23:22:42 EDT Ventricular Rate:  105 PR Interval:    QRS Duration: 88 QT Interval:  351 QTC Calculation: 464 R Axis:   16 Text Interpretation:  Sinus tachycardia with irregular rate LVH with  secondary repolarization abnormality Premature atrial complexes Confirmed  by HORTON  MD, Morrill  [id number]) on 03/19/2016 1:04:24 AM      MDM   Final diagnoses:  Hypoxia    Patient presents following a fall. Noted by EMS to be hypoxic. History of blood clots as well as current smoking and COPD. Nontoxic on exam. No significant wheezing on exam. EKG is sinus rhythm with PACs. Chest x-ray clear. Patient is no acute distress. Given smoking, will give prednisone and a DuoNeb to see if this improves oxygenation. No significant change. Given history of blood clots, will obtain CT scan. She is therapeutic on Coumadin but CT will give a clearer picture. CT scan negative for PE. She does have mild aneurysmal dilation of the thoracic aorta. Likely unrelated to current hypoxia. Patient is currently on 3 L of oxygen and is sleeping. O2 sats 86-89%.  Suspect hypoxia is multifactorial. Even though this is incidentally found, feel patient may need oxygen at home. Will admit for further evaluation and assessment for home O2.  I personally performed the services described in this documentation, which was scribed in my presence. The recorded information has been reviewed and is accurate.   Merryl Hacker, MD 03/19/16 [phone]

## 2016-03-18 NOTE — ED Notes (Signed)
Pt family at bedside and states that pt does not have a history of COPD pt denies any shortness of breath but continues to have decreased oxygen saturation of 88% on 2L. MD to be notified.

## 2016-03-18 NOTE — ED Notes (Signed)
Per EMS pt from home for evaluation of fall. Pt states she was walking in her bedroom and just lost her footing and fell onto bottom. Pt denies any LOC. Per EMS pt had heart rate of 100 A-fib with chronic history of A-Fib, SPO2 was 88% on room air and increased to 94% on 2L Oxygen. Pt alert but anxious at this time.

## 2016-03-19 ENCOUNTER — Ambulatory Visit: Payer: Medicare Other | Admitting: Family Medicine

## 2016-03-19 ENCOUNTER — Encounter (HOSPITAL_COMMUNITY): Payer: Self-pay

## 2016-03-19 ENCOUNTER — Emergency Department (HOSPITAL_COMMUNITY): Payer: Medicare Other

## 2016-03-19 DIAGNOSIS — J439 Emphysema, unspecified: Secondary | ICD-10-CM | POA: Diagnosis not present

## 2016-03-19 DIAGNOSIS — Z96652 Presence of left artificial knee joint: Secondary | ICD-10-CM | POA: Diagnosis present

## 2016-03-19 DIAGNOSIS — Z7984 Long term (current) use of oral hypoglycemic drugs: Secondary | ICD-10-CM | POA: Diagnosis not present

## 2016-03-19 DIAGNOSIS — K219 Gastro-esophageal reflux disease without esophagitis: Secondary | ICD-10-CM | POA: Diagnosis present

## 2016-03-19 DIAGNOSIS — E119 Type 2 diabetes mellitus without complications: Secondary | ICD-10-CM | POA: Diagnosis present

## 2016-03-19 DIAGNOSIS — R0902 Hypoxemia: Secondary | ICD-10-CM | POA: Diagnosis not present

## 2016-03-19 DIAGNOSIS — F17200 Nicotine dependence, unspecified, uncomplicated: Secondary | ICD-10-CM | POA: Diagnosis present

## 2016-03-19 DIAGNOSIS — Z8249 Family history of ischemic heart disease and other diseases of the circulatory system: Secondary | ICD-10-CM | POA: Diagnosis not present

## 2016-03-19 DIAGNOSIS — Z86711 Personal history of pulmonary embolism: Secondary | ICD-10-CM | POA: Diagnosis not present

## 2016-03-19 DIAGNOSIS — J9601 Acute respiratory failure with hypoxia: Secondary | ICD-10-CM | POA: Diagnosis not present

## 2016-03-19 DIAGNOSIS — J449 Chronic obstructive pulmonary disease, unspecified: Secondary | ICD-10-CM | POA: Diagnosis not present

## 2016-03-19 DIAGNOSIS — Z79899 Other long term (current) drug therapy: Secondary | ICD-10-CM | POA: Diagnosis not present

## 2016-03-19 DIAGNOSIS — I480 Paroxysmal atrial fibrillation: Secondary | ICD-10-CM | POA: Diagnosis present

## 2016-03-19 DIAGNOSIS — Z86718 Personal history of other venous thrombosis and embolism: Secondary | ICD-10-CM | POA: Diagnosis not present

## 2016-03-19 DIAGNOSIS — I10 Essential (primary) hypertension: Secondary | ICD-10-CM | POA: Diagnosis present

## 2016-03-19 DIAGNOSIS — F419 Anxiety disorder, unspecified: Secondary | ICD-10-CM | POA: Diagnosis present

## 2016-03-19 DIAGNOSIS — Z880 Allergy status to penicillin: Secondary | ICD-10-CM | POA: Diagnosis not present

## 2016-03-19 DIAGNOSIS — Z881 Allergy status to other antibiotic agents status: Secondary | ICD-10-CM | POA: Diagnosis not present

## 2016-03-19 DIAGNOSIS — J9611 Chronic respiratory failure with hypoxia: Secondary | ICD-10-CM | POA: Diagnosis present

## 2016-03-19 DIAGNOSIS — Z7901 Long term (current) use of anticoagulants: Secondary | ICD-10-CM | POA: Diagnosis not present

## 2016-03-19 DIAGNOSIS — Z888 Allergy status to other drugs, medicaments and biological substances status: Secondary | ICD-10-CM | POA: Diagnosis not present

## 2016-03-19 DIAGNOSIS — Y92009 Unspecified place in unspecified non-institutional (private) residence as the place of occurrence of the external cause: Secondary | ICD-10-CM | POA: Diagnosis not present

## 2016-03-19 DIAGNOSIS — Z833 Family history of diabetes mellitus: Secondary | ICD-10-CM | POA: Diagnosis not present

## 2016-03-19 DIAGNOSIS — J441 Chronic obstructive pulmonary disease with (acute) exacerbation: Secondary | ICD-10-CM | POA: Diagnosis present

## 2016-03-19 DIAGNOSIS — W010XXA Fall on same level from slipping, tripping and stumbling without subsequent striking against object, initial encounter: Secondary | ICD-10-CM | POA: Diagnosis present

## 2016-03-19 DIAGNOSIS — E785 Hyperlipidemia, unspecified: Secondary | ICD-10-CM | POA: Diagnosis present

## 2016-03-19 DIAGNOSIS — Z885 Allergy status to narcotic agent status: Secondary | ICD-10-CM | POA: Diagnosis not present

## 2016-03-19 LAB — PROTIME-INR
INR: 2.31 — ABNORMAL HIGH (ref 0.00–1.49)
Prothrombin Time: 24.4 s — ABNORMAL HIGH (ref 11.6–15.2)

## 2016-03-19 LAB — CBC WITH DIFFERENTIAL/PLATELET
Basophils Absolute: 0 10*3/uL (ref 0.0–0.1)
Basophils Relative: 0 %
Eosinophils Absolute: 0.1 10*3/uL (ref 0.0–0.7)
Eosinophils Relative: 2 %
HCT: 41.3 % (ref 36.0–46.0)
Hemoglobin: 13.1 g/dL (ref 12.0–15.0)
Lymphocytes Relative: 21 %
Lymphs Abs: 1.6 10*3/uL (ref 0.7–4.0)
MCH: 27.5 pg (ref 26.0–34.0)
MCHC: 31.7 g/dL (ref 30.0–36.0)
MCV: 86.6 fL (ref 78.0–100.0)
Monocytes Absolute: 0.7 10*3/uL (ref 0.1–1.0)
Monocytes Relative: 10 %
Neutro Abs: 5.1 10*3/uL (ref 1.7–7.7)
Neutrophils Relative %: 67 %
Platelets: 254 10*3/uL (ref 150–400)
RBC: 4.77 MIL/uL (ref 3.87–5.11)
RDW: 15.6 % — ABNORMAL HIGH (ref 11.5–15.5)
WBC: 7.5 10*3/uL (ref 4.0–10.5)

## 2016-03-19 LAB — BASIC METABOLIC PANEL
ANION GAP: 6 (ref 5–15)
BUN: 22 mg/dL — ABNORMAL HIGH (ref 6–20)
CHLORIDE: 101 mmol/L (ref 101–111)
CO2: 32 mmol/L (ref 22–32)
Calcium: 9.9 mg/dL (ref 8.9–10.3)
Creatinine, Ser: 0.67 mg/dL (ref 0.44–1.00)
GFR calc non Af Amer: >60 mL/min (ref >60)
GLUCOSE: 137 mg/dL — AB (ref 65–99)
POTASSIUM: 3.7 mmol/L (ref 3.5–5.1)
Sodium: 139 mmol/L (ref 135–145)

## 2016-03-19 LAB — D-DIMER, QUANTITATIVE: D-Dimer, Quant: 8.73 ug{FEU}/mL — ABNORMAL HIGH (ref 0.00–0.50)

## 2016-03-19 LAB — BRAIN NATRIURETIC PEPTIDE: B Natriuretic Peptide: 184.9 pg/mL — ABNORMAL HIGH (ref 0.0–100.0)

## 2016-03-19 MED ORDER — FUROSEMIDE 20 MG PO TABS
10.0000 mg | ORAL_TABLET | Freq: Every day | ORAL | Status: DC
  Administered 2016-03-19 – 2016-03-20 (×2): 10 mg via ORAL
  Filled 2016-03-19: qty 1
  Filled 2016-03-19: qty 0.5
  Filled 2016-03-19: qty 1

## 2016-03-19 MED ORDER — DOCUSATE SODIUM 100 MG PO CAPS: 100.0000 mg | ORAL_CAPSULE | Freq: Two times a day (BID) | ORAL | Status: DC | PRN

## 2016-03-19 MED ORDER — METFORMIN HCL ER 500 MG PO TB24
500.0000 mg | ORAL_TABLET | Freq: Every day | ORAL | Status: DC
  Administered 2016-03-19: 500 mg via ORAL
  Filled 2016-03-19 (×2): qty 1

## 2016-03-19 MED ORDER — ALPRAZOLAM 0.5 MG PO TABS: 1.0000 mg | ORAL_TABLET | Freq: Three times a day (TID) | ORAL | Status: DC | PRN

## 2016-03-19 MED ORDER — ACETAMINOPHEN 325 MG PO TABS
325.0000 mg | ORAL_TABLET | Freq: Four times a day (QID) | ORAL | Status: DC | PRN
Start: 2016-03-19 — End: 2016-03-20
  Administered 2016-03-19: 650 mg via ORAL
  Administered 2016-03-20: 325 mg via ORAL
  Filled 2016-03-19: qty 2
  Filled 2016-03-19: qty 1

## 2016-03-19 MED ORDER — METFORMIN HCL ER 500 MG PO TB24
1000.0000 mg | ORAL_TABLET | Freq: Every day | ORAL | Status: DC
  Administered 2016-03-19 – 2016-03-20 (×2): 1000 mg via ORAL
  Filled 2016-03-19 (×2): qty 2

## 2016-03-19 MED ORDER — NICOTINE 7 MG/24HR TD PT24
7.0000 mg | MEDICATED_PATCH | Freq: Every day | TRANSDERMAL | Status: DC
  Administered 2016-03-19 – 2016-03-20 (×2): 7 mg via TRANSDERMAL
  Filled 2016-03-19 (×2): qty 3

## 2016-03-19 MED ORDER — IPRATROPIUM-ALBUTEROL 0.5-2.5 (3) MG/3ML IN SOLN: 3.0000 mL | Freq: Four times a day (QID) | RESPIRATORY_TRACT | Status: DC | PRN

## 2016-03-19 MED ORDER — LORAZEPAM 1 MG PO TABS
1.0000 mg | ORAL_TABLET | Freq: Once | ORAL | Status: AC
  Administered 2016-03-19: 1 mg via ORAL
  Filled 2016-03-19: qty 1

## 2016-03-19 MED ORDER — LISINOPRIL 10 MG PO TABS
10.0000 mg | ORAL_TABLET | Freq: Every day | ORAL | Status: DC
  Administered 2016-03-19 – 2016-03-20 (×2): 10 mg via ORAL
  Filled 2016-03-19 (×3): qty 1

## 2016-03-19 MED ORDER — SIMVASTATIN 20 MG PO TABS: 20.0000 mg | ORAL_TABLET | Freq: Every day | ORAL | Status: DC

## 2016-03-19 MED ORDER — ADULT MULTIVITAMIN W/MINERALS CH
1.0000 | ORAL_TABLET | Freq: Every day | ORAL | Status: DC
  Administered 2016-03-19 – 2016-03-20 (×2): 1 via ORAL
  Filled 2016-03-19 (×3): qty 1

## 2016-03-19 MED ORDER — ALBUTEROL SULFATE (2.5 MG/3ML) 0.083% IN NEBU: 2.5000 mg | INHALATION_SOLUTION | RESPIRATORY_TRACT | Status: DC | PRN

## 2016-03-19 MED ORDER — LEVOTHYROXINE SODIUM 50 MCG PO TABS
50.0000 ug | ORAL_TABLET | Freq: Every day | ORAL | Status: DC
  Administered 2016-03-19 – 2016-03-20 (×2): 50 ug via ORAL
  Filled 2016-03-19 (×3): qty 1

## 2016-03-19 MED ORDER — WARFARIN SODIUM 2.5 MG PO TABS
2.5000 mg | ORAL_TABLET | Freq: Once | ORAL | Status: AC
  Administered 2016-03-19: 2.5 mg via ORAL
  Filled 2016-03-19: qty 1

## 2016-03-19 MED ORDER — NYSTATIN 100000 UNIT/GM EX POWD
1.0000 g | Freq: Two times a day (BID) | CUTANEOUS | Status: DC | PRN
  Filled 2016-03-19: qty 15

## 2016-03-19 MED ORDER — IPRATROPIUM-ALBUTEROL 0.5-2.5 (3) MG/3ML IN SOLN
3.0000 mL | Freq: Four times a day (QID) | RESPIRATORY_TRACT | Status: DC
  Administered 2016-03-19 – 2016-03-20 (×4): 3 mL via RESPIRATORY_TRACT
  Filled 2016-03-19 (×3): qty 3

## 2016-03-19 MED ORDER — PREDNISONE 20 MG PO TABS
40.0000 mg | ORAL_TABLET | Freq: Every day | ORAL | Status: DC
  Administered 2016-03-19 – 2016-03-20 (×2): 40 mg via ORAL
  Filled 2016-03-19 (×2): qty 2

## 2016-03-19 MED ORDER — ATORVASTATIN CALCIUM 10 MG PO TABS
10.0000 mg | ORAL_TABLET | Freq: Every day | ORAL | Status: DC
  Administered 2016-03-19: 10 mg via ORAL
  Filled 2016-03-19: qty 1

## 2016-03-19 MED ORDER — ALPRAZOLAM 1 MG PO TABS
1.0000 mg | ORAL_TABLET | ORAL | Status: DC | PRN
  Administered 2016-03-19 – 2016-03-20 (×3): 1 mg via ORAL
  Filled 2016-03-19 (×3): qty 1

## 2016-03-19 MED ORDER — DILTIAZEM HCL ER COATED BEADS 120 MG PO CP24
120.0000 mg | ORAL_CAPSULE | Freq: Every day | ORAL | Status: DC
  Administered 2016-03-19 – 2016-03-20 (×2): 120 mg via ORAL
  Filled 2016-03-19 (×3): qty 1

## 2016-03-19 MED ORDER — WARFARIN - PHARMACIST DOSING INPATIENT: Freq: Every day | Status: DC

## 2016-03-19 MED ORDER — PANTOPRAZOLE SODIUM 40 MG PO TBEC
80.0000 mg | DELAYED_RELEASE_TABLET | Freq: Every day | ORAL | Status: DC
  Administered 2016-03-19 – 2016-03-20 (×2): 80 mg via ORAL
  Filled 2016-03-19 (×2): qty 2

## 2016-03-19 MED ORDER — METFORMIN HCL ER 500 MG PO TB24
500.0000 mg | ORAL_TABLET | Freq: Two times a day (BID) | ORAL | Status: DC
  Filled 2016-03-19: qty 2

## 2016-03-19 MED ORDER — HYDROCODONE-ACETAMINOPHEN 5-325 MG PO TABS: 0.5000 | ORAL_TABLET | Freq: Four times a day (QID) | ORAL | Status: DC | PRN

## 2016-03-19 MED ORDER — IOPAMIDOL (ISOVUE-370) INJECTION 76%
100.0000 mL | Freq: Once | INTRAVENOUS | Status: AC | PRN
  Administered 2016-03-19: 100 mL via INTRAVENOUS

## 2016-03-19 NOTE — ED Notes (Signed)
Pt eating sandwich and graham crackers without difficulty. Spouse at bedside. Awaiting transfer to fourth floor.

## 2016-03-19 NOTE — ED Notes (Signed)
Dr.Gardner at bedside to discuss plan of care with pt and spouse.

## 2016-03-19 NOTE — H&P (Signed)
History and Physical    Sarah Mcguire [id number] DOB: Aug 17, 1941 DOA: 03/18/2016   PCP: Annye Asa, MD Chief Complaint:  Chief Complaint  Patient presents with  . Fall  . COPD  . Hypoxic    HPI: Sarah Mcguire is a 75 y.o. female with medical history significant of severe anxiety, PE.  Patient had a fall at home.  She was unhurt, but unable to get up.  EMS was called to the house.  In the course of seeing her they checked vitals and her pulse ox was noted to be in the 80s on room air.  She was brought in to the ED.  After extensive questioning, the patient finally does admit that she has had cough the past couple of days "due to sinus congestion" that she got from "polen exposure".  She continues to deny SOB.  ED Course: Hypoxic in ER.  Review of prior records are negative for baseline hypoxia.  Review of Systems: As per HPI otherwise 10 point review of systems negative.    Past Medical History  Diagnosis Date  . Hyperlipidemia   . Hypertension   . Hyperglycemia   . GERD (gastroesophageal reflux disease)   . Anemia   . Leukocytosis   . Diverticulitis   . Neuropathy ([place])   . Anxiety     SEVERE  . 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 DVT (deep venous thrombosis) ([place])   . Diabetes mellitus     borderline no medications  . Arthritis   . Atrial fibrillation with RVR ([place]) 11/28/2012    Past Surgical History  Procedure Laterality Date  . Tonsillectomy    . Tympanostomy tube placement      R ear  . 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  She has been smoking about 0.80 packs per day. She has never used smokeless tobacco. She reports that she does not drink alcohol or use illicit  drugs.  Allergies  Allergen Reactions  . Morphine And Related     Usually drops her blood pressure  . Amoxicillin Other (See Comments)    Unknown Has patient had a PCN reaction causing immediate rash, facial/tongue/throat swelling, SOB or lightheadedness with hypotension:unsure Has patient had a PCN reaction causing severe rash involving mucus membranes or skin necrosis:unsure Has patient had a PCN reaction that required hospitalization:unsure Has patient had a PCN reaction occurring within the last 10 years:unsure If all of the above answers are "NO", then may proceed with Cephalosporin use.     Marland Kitchen Amoxicillin-Pot Clavulanate Other (See Comments)    Has patient had a PCN reaction causing immediate rash, facial/tongue/throat swelling, SOB or lightheadedness with hypotension:unsure Has patient had a PCN reaction causing severe rash involving mucus membranes or skin necrosis:unsure Has patient had a PCN reaction that required hospitalization:unsure Has patient had a PCN reaction occurring within the last 10 years:unsure If all of the above answers are "NO", then may proceed with Cephalosporin use.    . Citalopram Hydrobromide Other (See Comments)    Itching   . Clonazepam Itching  . Cymbalta [Duloxetine Hcl] Other (See Comments)    Nausea, headache and diarrhea   . Macrobid [Nitrofurantoin Macrocrystal] Other (See Comments)  Nausea, vomitting  . Penicillins Other (See Comments)    Unknown Has patient had a PCN reaction causing immediate rash, facial/tongue/throat swelling, SOB or lightheadedness with hypotension:unsure Has patient had a PCN reaction causing severe rash involving mucus membranes or skin necrosis:unsure Has patient had a PCN reaction that required hospitalization:unsure Has patient had a PCN reaction occurring within the last 10 years:unsure If all of the above answers are "NO", then may proceed with Cephalosporin use.      . Pyridium [Phenazopyridine Hcl]  Other (See Comments)    headache  . Zonegran Other (See Comments)    Unknown   . Zonisamide Other (See Comments)    Unknown   . Sertraline Hcl Anxiety  . Wellbutrin [Bupropion] Itching and Rash    Itching, rash, hyper     Family History  Problem Relation Age of Onset  . Diabetes Mother   . Hypertension Mother   . Heart disease Father     MI age 55's   . Diabetes Brother   . Hypertension Brother       Prior to Admission medications   Medication Sig Start Date End Date Taking? Authorizing Provider  acetaminophen (TYLENOL) 325 MG tablet Take 325-650 mg by mouth every 6 (six) hours as needed (for headache).    Yes Historical Provider, MD  alprazolam (XANAX) 2 MG tablet TAKE 1/2-1 TABLET BY MOUTH 3 TIMES A DAY AS NEEDED FOR SLEEP OR ANXIETY Patient taking differently: Take 1 mg by mouth 6 (six) times daily.  03/16/16  Yes Midge Minium, MD  Cholecalciferol (VITAMIN D) 2000 UNITS tablet Take 2,000 Units by mouth daily.   Yes Historical Provider, MD  clobetasol cream (TEMOVATE) 0.05 % APPLY EXTERNALLY AT BEDTIME FOR ONE MONTH THEN AS NEEDED FOR ITCHING 02/24/16  Yes Timothy P Fontaine, MD  diltiazem (CARDIZEM CD) 120 MG 24 hr capsule TAKE 1 CAPSULE (120 MG TOTAL) BY MOUTH DAILY. 01/23/16  Yes Josue Hector, MD  docusate sodium (COLACE) 100 MG capsule Take 100 mg by mouth 2 (two) times daily as needed for mild constipation.   Yes Historical Provider, MD  esomeprazole (NEXIUM) 40 MG capsule Take 1 capsule (40 mg total) by mouth daily as needed. For acid reflux. Patient taking differently: Take 40 mg by mouth daily before breakfast. For acid reflux. 05/24/15  Yes Midge Minium, MD  fenofibrate 160 MG tablet TAKE 1 TABLET BY MOUTH EVERY DAY 09/28/15  Yes Midge Minium, MD  folic acid (FOLVITE) 1 MG tablet Take 1 tablet (1 mg total) by mouth daily. 05/31/14  Yes Midge Minium, MD  furosemide (LASIX) 40 MG tablet Take 1 tablet (40 mg total) by mouth daily. Patient taking  differently: Take 10 mg by mouth daily with breakfast.  11/08/14  Yes Midge Minium, MD  glucose blood (ONE TOUCH ULTRA TEST) test strip TEST TWICE A DAY AS DIRECTED 12/19/15  Yes Midge Minium, MD  HYDROcodone-acetaminophen (NORCO/VICODIN) 5-325 MG tablet Take 1-2 tablets by mouth every 4 (four) hours as needed. Patient taking differently: Take 0.5 tablets by mouth every 6 (six) hours as needed.  03/02/16  Yes Midge Minium, MD  levothyroxine (SYNTHROID, LEVOTHROID) 50 MCG tablet Take 1 tablet (50 mcg total) by mouth daily. Patient taking differently: Take 50 mcg by mouth daily before breakfast.  12/19/15  Yes Midge Minium, MD  lidocaine (LIDODERM) 5 % Place 1 patch onto the skin daily as needed (for pain.). Remove & Discard patch within 12 hours  or as directed by MD   Yes Historical Provider, MD  lidocaine (XYLOCAINE) 5 % ointment APPLY 1 APPLICATION TOPICALLY 3 (THREE) TIMES DAILY AS NEEDED. FOR ITCHING DIRECTLY TO THE SPOT 02/24/16  Yes Anastasio Auerbach, MD  lisinopril (PRINIVIL,ZESTRIL) 10 MG tablet Take 1 tablet (10 mg total) by mouth daily. 11/30/15  Yes Midge Minium, MD  metFORMIN (GLUCOPHAGE-XR) 500 MG 24 hr tablet TAKE 2 TABLETS BY MOUTH IN THE MORNING WITH FOOD AND 1 TABLET IN THE EVENING WITH FOOD. 01/23/16  Yes Midge Minium, MD  Multiple Vitamin (MULTIVITAMIN) tablet Take 1 tablet by mouth daily.     Yes Historical Provider, MD  nystatin (MYCOSTATIN/NYSTOP) 100000 UNIT/GM POWD Apply to affected area twice daily Patient taking differently: Apply 1 g topically 2 (two) times daily as needed (for irritation of skin folds). Apply to affected area twice daily 10/10/15  Yes Anastasio Auerbach, MD  simvastatin (ZOCOR) 20 MG tablet TAKE 1 TABLET (20 MG TOTAL) BY MOUTH EVERY EVENING. 11/30/15  Yes Midge Minium, MD  warfarin (COUMADIN) 5 MG tablet TAKE 1 TABLET ON TUES, THURS, SATURDAY AND SUNDAY, TAKE 1/2 TAB ON REMAINING DAYS Patient taking differently: TAKE 1 TABLET  (5 MG) ON TUES, THURS, SATURDAY AND SUNDAY, TAKE 1/2 TAB (2.5 MG) ON MON and WED, no pill on FRIDAYS 11/24/15  Yes Midge Minium, MD    Physical Exam: Filed Vitals:   03/19/16 0145 03/19/16 0300 03/19/16 0330 03/19/16 0430  BP: 111/89 104/72 103/82 114/84  Pulse: 115 104 107 108  Temp:      TempSrc:      Resp: 27 23 24 29   SpO2: 91% 87% 89% 87%      Constitutional: NAD, calm, comfortable Eyes: PERRL, lids and conjunctivae normal ENMT: Mucous membranes are moist. Posterior pharynx clear of any exudate or lesions.Normal dentition.  Neck: normal, supple, no masses, no thyromegaly Respiratory: clear to auscultation bilaterally, no wheezing, no crackles. Normal respiratory effort. No accessory muscle use.  Cardiovascular: Regular rate and rhythm, no murmurs / rubs / gallops. No extremity edema. 2+ pedal pulses. No carotid bruits.  Abdomen: no tenderness, no masses palpated. No hepatosplenomegaly. Bowel sounds positive.  Musculoskeletal: no clubbing / cyanosis. No joint deformity upper and lower extremities. Good ROM, no contractures. Normal muscle tone.  Skin: no rashes, lesions, ulcers. No induration Neurologic: CN 2-12 grossly intact. Sensation intact, DTR normal. Strength 5/5 in all 4.  Psychiatric: Normal judgment and insight. Alert and oriented x 3. Normal mood.    Labs on Admission: I have personally reviewed following labs and imaging studies  CBC:  Recent Labs Lab 03/14/16 1130 03/19/16 0017  WBC 7.6 7.5  NEUTROABS 4.9 5.1  HGB 13.9 13.1  HCT 42.6 41.3  MCV 84.6 86.6  PLT 268.0 0000000   Basic Metabolic Panel:  Recent Labs Lab 03/14/16 1130 03/19/16 0017  NA 141 139  K 4.5 3.7  CL 104 101  CO2 30 32  GLUCOSE 84 137*  BUN 19 22*  CREATININE 0.70 0.67  CALCIUM 10.5 9.9   GFR: Estimated Creatinine Clearance: 57.6 mL/min (by C-G formula based on Cr of 0.67). Liver Function Tests:  Recent Labs Lab 03/14/16 1130  AST 16  ALT 14  ALKPHOS 45  BILITOT  0.3  PROT 6.7  ALBUMIN 4.1   No results for input(s): LIPASE, AMYLASE in the last 168 hours. No results for input(s): AMMONIA in the last 168 hours. Coagulation Profile:  Recent Labs Lab 03/19/16 0017  INR 2.31*   Cardiac Enzymes: No results for input(s): CKTOTAL, CKMB, CKMBINDEX, TROPONINI in the last 168 hours. BNP (last 3 results) No results for input(s): PROBNP in the last 8760 hours. HbA1C: No results for input(s): HGBA1C in the last 72 hours. CBG: No results for input(s): GLUCAP in the last 168 hours. Lipid Profile: No results for input(s): CHOL, HDL, LDLCALC, TRIG, CHOLHDL, LDLDIRECT in the last 72 hours. Thyroid Function Tests: No results for input(s): TSH, T4TOTAL, FREET4, T3FREE, THYROIDAB in the last 72 hours. Anemia Panel: No results for input(s): VITAMINB12, FOLATE, FERRITIN, TIBC, IRON, RETICCTPCT in the last 72 hours. Urine analysis:    Component Value Date/Time   COLORURINE YELLOW 04/04/2015 1328   APPEARANCEUR CLOUDY* 04/04/2015 1328   LABSPEC 1.014 04/04/2015 1328   PHURINE 8.0 04/04/2015 1328   GLUCOSEU NEGATIVE 04/04/2015 1328   HGBUR NEGATIVE 04/04/2015 1328   HGBUR negative 10/24/2010 1140   BILIRUBINUR negative 06/09/2015 1527   BILIRUBINUR NEGATIVE 04/04/2015 1328   KETONESUR NEGATIVE 04/04/2015 1328   PROTEINUR negative 06/09/2015 1527   PROTEINUR NEGATIVE 04/04/2015 1328   UROBILINOGEN 0.2 06/09/2015 1527   UROBILINOGEN 0.2 04/04/2015 1328   NITRITE negative 06/09/2015 1527   NITRITE NEGATIVE 04/04/2015 1328   LEUKOCYTESUR small (1+)* 06/09/2015 1527   Sepsis Labs: @LABRCNTIP (procalcitonin:4,lacticidven:4) )No results found for this or any previous visit (from the past 240 hour(s)).   Radiological Exams on Admission: Dg Chest 2 View  03/19/2016  CLINICAL DATA:  Hypoxia EXAM: CHEST  2 VIEW COMPARISON:  05/23/2015 FINDINGS: Chronic cardiopericardial enlargement. Chronic hyperinflation with flat diaphragm. Stable increased markings at the  bases with reticular pattern greater on the right. Patient has emphysema by 2013 chest CT. There is no edema, consolidation, effusion, or pneumothorax. 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 IMPRESSION: 1. No acute finding 2.  Emphysema. (ICD10-J43.9) 3. Stable cardiomegaly. Electronically Signed   By: Monte Fantasia M.D.   On: 03/19/2016 01:57   Ct Angio Chest Pe W/cm &/or Wo Cm  03/19/2016  CLINICAL DATA:  Hypoxia.  Status post fall. EXAM: CT ANGIOGRAPHY CHEST WITH CONTRAST TECHNIQUE: Multidetector CT imaging of the chest was performed using the standard protocol during bolus administration of intravenous contrast. Multiplanar CT image reconstructions and MIPs were obtained to evaluate the vascular anatomy. CONTRAST:  100 mL of Isovue 370 COMPARISON:  June 26, 2012 FINDINGS: Central airways are normal. No pneumothorax. Emphysematous changes are seen within the lungs. Dependent bibasilar atelectasis also noted. No suspicious pulmonary nodules, masses, or focal infiltrates. The ascending thoracic aorta measures up to 4.2 cm in AP diameter. No dissection. Atherosclerotic change seen within the thoracic aorta. There are a few shotty nodes in the AP window and mediastinum, likely reactive. No new enlarged nodes identified. No pulmonary emboli. Evaluation of the the upper abdomen is unremarkable Degenerative changes in the spine.  No other bony abnormalities. Review of the MIP images confirms the above findings. IMPRESSION: 1. No pulmonary emboli. 2. Emphysema. 3. 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 4. Increased prominence as shotty mediastinal nodes, likely reactive. Recommend attention on follow-up. Electronically Signed   By: Dorise Bullion III M.D   On: 03/19/2016 03:56    EKG: Independently reviewed.  Assessment/Plan Principal Problem:   Acute respiratory failure with hypoxia (HCC) Active Problems:   Hypoxia   Acute  respiratory failure with hypoxia - likely an exacerbation of undiagnosed COPD / emphysema  Prednisone  Adult wheeze protocol  Has a new  oxygen requirement at this time  Continuous pulse ox  PAF-  Continue coumadin  Continue cardizem   DVT prophylaxis: coumadin Code Status: Full Family Communication: Husband at bedside Consults called: None Admission status: Admit to inpatient   Etta Quill DO [facility] Pager [phone] from 7PM-7AM  If 7AM-7PM, please contact the day physician for the patient [email or web address] Password [email or web address]  03/19/2016, 5:12 AM

## 2016-03-19 NOTE — Progress Notes (Signed)
Pharmacy Brief Note  FDA warning states to limit simvastatin to 10mg /day when used with diltiazem.  Patients on diltiazem and simvastatin >10 mg/day have reported cases of rhabdomyolysis.   Atorvastatin (Lipitor) 10mg  was substituted for simvastatin 20 mg per P&T policy.    Please consider at discharge.  Thanks, Pharmacy.  Gretta Arab PharmD, BCPS Pager [phone] 03/19/2016 10:57 AM

## 2016-03-19 NOTE — Progress Notes (Signed)
ANTICOAGULATION CONSULT NOTE - Initial Consult  Pharmacy Consult for Warfarin Indication: AFib and H/O PE/DVT  Allergies  Allergen Reactions  . Morphine And Related     Usually drops her blood pressure  . Amoxicillin Other (See Comments)    Unknown Has patient had a PCN reaction causing immediate rash, facial/tongue/throat swelling, SOB or lightheadedness with hypotension:unsure Has patient had a PCN reaction causing severe rash involving mucus membranes or skin necrosis:unsure Has patient had a PCN reaction that required hospitalization:unsure Has patient had a PCN reaction occurring within the last 10 years:unsure If all of the above answers are "NO", then may proceed with Cephalosporin use.     Marland Kitchen Amoxicillin-Pot Clavulanate Other (See Comments)    Has patient had a PCN reaction causing immediate rash, facial/tongue/throat swelling, SOB or lightheadedness with hypotension:unsure Has patient had a PCN reaction causing severe rash involving mucus membranes or skin necrosis:unsure Has patient had a PCN reaction that required hospitalization:unsure Has patient had a PCN reaction occurring within the last 10 years:unsure If all of the above answers are "NO", then may proceed with Cephalosporin use.    . Citalopram Hydrobromide Other (See Comments)    Itching   . Clonazepam Itching  . Cymbalta [Duloxetine Hcl] Other (See Comments)    Nausea, headache and diarrhea   . Macrobid [Nitrofurantoin Macrocrystal] Other (See Comments)    Nausea, vomitting  . Penicillins Other (See Comments)    Unknown Has patient had a PCN reaction causing immediate rash, facial/tongue/throat swelling, SOB or lightheadedness with hypotension:unsure Has patient had a PCN reaction causing severe rash involving mucus membranes or skin necrosis:unsure Has patient had a PCN reaction that required hospitalization:unsure Has patient had a PCN reaction occurring within the last 10 years:unsure If all of the  above answers are "NO", then may proceed with Cephalosporin use.      . Pyridium [Phenazopyridine Hcl] Other (See Comments)    headache  . Zonegran Other (See Comments)    Unknown   . Zonisamide Other (See Comments)    Unknown   . Sertraline Hcl Anxiety  . Wellbutrin [Bupropion] Itching and Rash    Itching, rash, hyper     Patient Measurements:    Vital Signs: Temp: 98.2 F (36.8 C) (07/16 2258) Temp Source: Oral (07/16 2258) BP: 103/82 mmHg (07/17 0330) Pulse Rate: 107 (07/17 0330)  Labs:  Recent Labs  03/19/16 0017  HGB 13.1  HCT 41.3  PLT 254  LABPROT 24.4*  INR 2.31*  CREATININE 0.67    Estimated Creatinine Clearance: 57.6 mL/min (by C-G formula based on Cr of 0.67).   Medical History: Past Medical History  Diagnosis Date  . Hyperlipidemia   . Hypertension   . Hyperglycemia   . GERD (gastroesophageal reflux disease)   . Anemia   . Leukocytosis   . Diverticulitis   . Neuropathy ([place])   . Anxiety     SEVERE  . 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 DVT (deep venous thrombosis) ([place])   . Diabetes mellitus     borderline no medications  . Arthritis   . Atrial fibrillation with RVR ([place]) 11/28/2012    Medications:  Scheduled:  . diltiazem  120 mg Oral Daily  . furosemide  10 mg Oral Q breakfast  . levothyroxine  50 mcg Oral QAC breakfast  . lisinopril  10 mg Oral Daily  . metFORMIN  1,000 mg Oral  Q breakfast  . metFORMIN  500 mg Oral Q supper  . multivitamin with minerals  1 tablet Oral Daily  . nicotine  7-21 mg Transdermal Daily  . pantoprazole  80 mg Oral Q1200  . simvastatin  20 mg Oral q1800  . warfarin  2.5 mg Oral ONCE-1800  . Warfarin - Pharmacist Dosing Inpatient   Does not apply q1800   Infusions:    Assessment:  75 yr female presents to ED s/p fall and found to be hypoxic  PTA patient on warfarin for h/o AFib and  PE/DVT  Home warfarin regimen: 2.5mg  on Tues/Thur/Sat/Sun, 5mg  on Mon/Wed and no warfarin on Fridays  Last warfarin dose reported as taken 7/16 @ 17:30  INR on admission = 2.31 (therapeutic)  Pharmacy consulted to dose warfarin  Goal of Therapy:  INR 2-3   Plan:  Warfarin 2.5mg  po x 1 today @ 18:00 Daily PT/INR  Jaylah Goodlow, [place], PharmD 03/19/2016,4:47 AM

## 2016-03-19 NOTE — ED Notes (Signed)
Patient transported to CT 

## 2016-03-19 NOTE — Progress Notes (Signed)
No charge AM follow up note.  Pt admitted with new O2 requirement. Seen and examined with husband present.   She is very anxious, audible wheezing from the bedside.  She's very weak as well, unable to sit up on her own in bed.  Plan: - schedule Duonebs, add PRN albuterol nebs - titrate O2 to keep sats above 92% - PT consult

## 2016-03-19 NOTE — ED Notes (Signed)
Pt laying in bed eyes closed talking to self. Spouse at bedside. Awaiting  CT scan results and disposition. Dr.Horton aware of oxygen saturation on 3L.

## 2016-03-19 NOTE — Progress Notes (Signed)
RT protocol assessment completed.  Pt seen, no respiratory distress noted or voiced by pt at this time.  Pt denies sob, or cough.  HR107, RR24, Spo2 94% on 3lnc.  BBS clear, and diminished in the bases.  No wheezes auscultated.  Pt states she does not take any breathing treatments or inhalers at home.  Bronchodilator treatments not indicated at this time.  Duoneb ordered q6prn for sob/wheeze.  RT will continue to monitor and assess as needed.

## 2016-03-19 NOTE — ED Notes (Signed)
Spouse speaking with Dr.Gardner about plan of care at this time. Dr.Gardner aware of oxygen levels at this time.

## 2016-03-20 ENCOUNTER — Other Ambulatory Visit: Payer: Self-pay | Admitting: General Practice

## 2016-03-20 DIAGNOSIS — J9601 Acute respiratory failure with hypoxia: Secondary | ICD-10-CM

## 2016-03-20 LAB — BASIC METABOLIC PANEL
Anion gap: 7 (ref 5–15)
BUN: 24 mg/dL — AB (ref 6–20)
CALCIUM: 9.3 mg/dL (ref 8.9–10.3)
CO2: 30 mmol/L (ref 22–32)
CREATININE: 0.68 mg/dL (ref 0.44–1.00)
Chloride: 102 mmol/L (ref 101–111)
GFR calc Af Amer: >60 mL/min (ref >60)
Glucose, Bld: 179 mg/dL — ABNORMAL HIGH (ref 65–99)
POTASSIUM: 4.1 mmol/L (ref 3.5–5.1)
SODIUM: 139 mmol/L (ref 135–145)

## 2016-03-20 LAB — CBC
HEMATOCRIT: 36.3 % (ref 36.0–46.0)
Hemoglobin: 11.6 g/dL — ABNORMAL LOW (ref 12.0–15.0)
MCH: 27.4 pg (ref 26.0–34.0)
MCHC: 32 g/dL (ref 30.0–36.0)
MCV: 85.8 fL (ref 78.0–100.0)
PLATELETS: 248 10*3/uL (ref 150–400)
RBC: 4.23 MIL/uL (ref 3.87–5.11)
RDW: 15.7 % — AB (ref 11.5–15.5)
WBC: 5.9 10*3/uL (ref 4.0–10.5)

## 2016-03-20 LAB — PROTIME-INR
INR: 3.09 — AB (ref 0.00–1.49)
PROTHROMBIN TIME: 30.4 s — AB (ref 11.6–15.2)

## 2016-03-20 MED ORDER — METHYLPREDNISOLONE 4 MG PO TBPK: ORAL_TABLET | ORAL | Status: DC

## 2016-03-20 MED ORDER — FENOFIBRATE 160 MG PO TABS: 160.0000 mg | ORAL_TABLET | Freq: Every day | ORAL | Status: DC

## 2016-03-20 MED ORDER — IPRATROPIUM-ALBUTEROL 0.5-2.5 (3) MG/3ML IN SOLN
3.0000 mL | Freq: Three times a day (TID) | RESPIRATORY_TRACT | Status: DC
  Administered 2016-03-20: 3 mL via RESPIRATORY_TRACT
  Filled 2016-03-20: qty 3

## 2016-03-20 MED ORDER — ALBUTEROL SULFATE HFA 108 (90 BASE) MCG/ACT IN AERS: 2.0000 | INHALATION_SPRAY | Freq: Four times a day (QID) | RESPIRATORY_TRACT | Status: DC | PRN

## 2016-03-20 MED ORDER — NICOTINE 7 MG/24HR TD PT24: 14.0000 mg | MEDICATED_PATCH | Freq: Every day | TRANSDERMAL | Status: DC

## 2016-03-20 MED ORDER — WARFARIN SODIUM 1 MG PO TABS
1.0000 mg | ORAL_TABLET | Freq: Once | ORAL | Status: DC
  Filled 2016-03-20: qty 1

## 2016-03-20 NOTE — Progress Notes (Signed)
SATURATION QUALIFICATIONS: (This note is used to comply with regulatory documentation for home oxygen)  Patient Saturations on Room Air at Rest = 82%  Patient Saturations on Room Air while Ambulating =not tested  Patient Saturations on 3 Liters of oxygen while on rest=93%  Please briefly explain why patient needs home oxygen:

## 2016-03-20 NOTE — Progress Notes (Signed)
ANTICOAGULATION CONSULT NOTE - Follow up [facility] for Warfarin Indication: AFib and H/O PE/DVT  Patient Measurements: Height: 5\' 3"  (160 cm) Weight: 159 lb 9.8 oz (72.4 kg) IBW/kg (Calculated) : 52.4  Vital Signs: Temp: 97.5 F (36.4 C) (07/18 0558) Temp Source: Oral (07/18 0558) BP: 101/67 mmHg (07/18 0715) Pulse Rate: 86 (07/18 0715)  Labs:  Recent Labs  03/19/16 0017 03/20/16 0504  HGB 13.1 11.6*  HCT 41.3 36.3  PLT 254 248  LABPROT 24.4* 30.4*  INR 2.31* 3.09*  CREATININE 0.67 0.68    Estimated Creatinine Clearance: 57.9 mL/min (by C-G formula based on Cr of 0.68).   Medications:  Scheduled:  . atorvastatin  10 mg Oral q1800  . diltiazem  120 mg Oral Daily  . furosemide  10 mg Oral Q breakfast  . ipratropium-albuterol  3 mL Nebulization TID  . levothyroxine  50 mcg Oral QAC breakfast  . lisinopril  10 mg Oral Daily  . metFORMIN  1,000 mg Oral Q breakfast  . metFORMIN  500 mg Oral Q supper  . multivitamin with minerals  1 tablet Oral Daily  . nicotine  7-21 mg Transdermal Daily  . pantoprazole  80 mg Oral Q1200  . predniSONE  40 mg Oral Q breakfast  . Warfarin - Pharmacist Dosing Inpatient   Does not apply q1800    Assessment: 75 yr female presents to ED s/p fall and found to be hypoxic.  PMH includes chronic warfarin for h/o AFib and PE/DVT.  Pharmacy consulted to dose warfarin.  Home warfarin regimen: 2.5mg  on Tues/Thur/Sat/Sun, 5mg  on Mon/Wed and no warfarin on Fridays.  LD 7/16 @ 17:30  INR on admission = 2.31 (therapeutic)  CT angio = no PE  Today, 03/20/2016: INR increased to 3.09, slightly supratherapeutic CBC: Hgb decreased (13.1 > 11.6 since admission), Plt remain WNL. Diet: PO Drug-drug interactions: Prednisone may enhance the anticoagulant effect of Warfarin.  Goal of Therapy:  INR 2-3   Plan:  Low dose, Warfarin 1 mg PO x 1 today @ 18:00 Daily PT/INR  Gretta Arab PharmD, BCPS Pager [phone] 03/20/2016 9:27  AM

## 2016-03-20 NOTE — Progress Notes (Signed)
Patient has been very anxious all night.  Patient is paranoid and delusional now. Patient is  imagining that the staff and the patient's spouse are conspiring against the patient.  Therapeutic communication was used to try and calm the patient.  Anti-anxiety medication and an analgesic were given to the patient.

## 2016-03-20 NOTE — Care Management Note (Signed)
Case Management Note  Patient Details  Name: Sarah Mcguire MRN: [id number] Date of Birth: 1940/11/07  Subjective/Objective: PT recc HHPT, nurse to check 02 sats for possible need for home 02.[facility] dme rep aware & following. Will provide [facility] provider list-await choice, & HHPT order,[id number],& home 02 orders.                   Action/Plan:d/c home w/HHC.  Expected Discharge Date:                  Expected Discharge Plan:  [place]  In-House Referral:     Discharge planning Services  CM Consult  Post Acute Care Choice:    Choice offered to:  Patient  DME Arranged:    DME Agency:  [place]:    [facility] Agency:     Status of Service:  In process, will continue to follow  If discussed at Long Length of Stay Meetings, dates discussed:    Additional Comments:  Dessa Phi, RN 03/20/2016, 1:27 PM

## 2016-03-20 NOTE — Care Management Note (Signed)
Case Management Note  Patient Details  Name: CHARLETT HINKLEY MRN: [id number] Date of Birth: 17-Mar-1941  Subjective/Objective:  [facility] chosen for HHPT, await 02 sats documentation. MD has alread ordered home 02-[facility] dme rep aware & waiting for qualifying 02 sats. Per MD no neb machine will be ordered.                 Action/Plan:d/c home w/HHC.   Expected Discharge Date:                  Expected Discharge Plan:  [place]  In-House Referral:     Discharge planning Services  CM Consult  Post Acute Care Choice:    Choice offered to:  Patient  DME Arranged:    DME Agency:  [place]:  PT [facility] Agency:  [place]  Status of Service:  In process, will continue to follow  If discussed at Long Length of Stay Meetings, dates discussed:    Additional Comments:  Dessa Phi, RN 03/20/2016, 1:56 PM

## 2016-03-20 NOTE — Progress Notes (Signed)
Patient had 7 beats of V-Tach. Vitals were: 97.8F;HR82;RR18;101/67;95% 3 L [place] Patient is resting in bed. Asymptomatic. PCP notified.

## 2016-03-20 NOTE — Evaluation (Signed)
Physical Therapy Evaluation Patient Details Name: RAELANI NIWA MRN: [id number] DOB: [birth year] Today's Date: 03/20/2016   History of Present Illness  75 yo female admitted with acute respiratory failure, PAF, fall. Hx of anxiety, PE, neuropathy, PE, DVT, DM, Afib.   Clinical Impression  On eval, pt required Min assist for mobility-walked ~60 feet with RW. Remained on [place] O2 throughout session. Pt denied dyspnea. She tolerated ambulation distance well. Recommend HHPT follow up.     Follow Up Recommendations Home health PT;Supervision/Assistance - 24 hour    Equipment Recommendations  None recommended by PT    Recommendations for Other Services       Precautions / Restrictions Precautions Precautions: Fall Restrictions Weight Bearing Restrictions: No      Mobility  Bed Mobility Overal bed mobility: Needs Assistance Bed Mobility: Supine to Sit     Supine to sit: HOB elevated;Min assist     General bed mobility comments: Assist for trunk to upright. Increased time.   Transfers Overall transfer level: Needs assistance Equipment used: Rolling walker (2 wheeled) Transfers: Sit to/from Stand Sit to Stand: Min assist         General transfer comment: x2 (once from bed, once from commode). Assist to rise, stabilize, control descent. VCs safety, technique, hand placement  Ambulation/Gait Ambulation/Gait assistance: Min assist Ambulation Distance (Feet): 60 Feet Assistive device: Rolling walker (2 wheeled) Gait Pattern/deviations: Step-through pattern;Decreased stride length;Trunk flexed     General Gait Details: Assist to stabilize. slow gait speed. Pt denied dyspnea. Remained on 3L [place] O2.   Stairs            Wheelchair Mobility    Modified Rankin (Stroke Patients Only)       Balance Overall balance assessment: Needs assistance;History of Falls         Standing balance support: Bilateral upper extremity supported;During functional activity Standing  balance-Leahy Scale: Poor                               Pertinent Vitals/Pain Pain Assessment: Faces Faces Pain Scale: Hurts little more Pain Location: head Pain Descriptors / Indicators: Aching Pain Intervention(s): Monitored during session;Repositioned    Home Living Family/patient expects to be discharged to:: Private residence Living Arrangements: Spouse/significant other Available Help at Discharge: Family;Available 24 hours/day Type of Home: House Home Access: Stairs to enter   [employer] of Steps: 1 Home Layout: One level Home Equipment: Environmental consultant - 2 wheels      Prior Function Level of Independence: Needs assistance   Gait / Transfers Assistance Needed: husband assists with mobilizing around home  ADL's / Homemaking Assistance Needed: husband assists with bathing, dressing        Hand Dominance        Extremity/Trunk Assessment   Upper Extremity Assessment: Generalized weakness           Lower Extremity Assessment: Generalized weakness         Communication   Communication: HOH  Cognition Arousal/Alertness: Awake/alert Behavior During Therapy: WFL for tasks assessed/performed Overall Cognitive Status: Within Functional Limits for tasks assessed                      General Comments      Exercises        Assessment/Plan    PT Assessment Patient needs continued PT services  PT Diagnosis Difficulty walking;Generalized weakness   PT Problem List Decreased strength;Decreased activity tolerance;Decreased  balance;Decreased mobility;Decreased knowledge of use of DME  PT Treatment Interventions DME instruction;Gait training;Functional mobility training;Therapeutic activities;Patient/family education;Balance training;Therapeutic exercise   PT Goals (Current goals can be found in the Care Plan section) Acute Rehab PT Goals Patient Stated Goal: home soon.  PT Goal Formulation: With patient/family Time For Goal  Achievement: 04/03/16 Potential to Achieve Goals: Good    Frequency Min 3X/week   Barriers to discharge        Co-evaluation               End of Session Equipment Utilized During Treatment: Gait belt Activity Tolerance: Patient tolerated treatment well Patient left: in chair;with call bell/phone within reach;with family/visitor present;with chair alarm set           Time: 1110-1131 PT Time Calculation (min) (ACUTE ONLY): 21 min   Charges:   PT Evaluation $PT Eval Low Complexity: 1 Procedure     PT G Codes:        Weston Anna, MPT Pager: [phone]

## 2016-03-20 NOTE — Care Management Note (Signed)
Case Management Note  Patient Details  Name: Sarah Mcguire MRN: [id number] Date of Birth: [birth year]  Subjective/Objective:  Noted qualifies for home 02-[facility] dme rep Jermaine aware of order,will deliver home 02 to rm.                  Action/Plan:d/c home w/HHC/home 02   Expected Discharge Date:                  Expected Discharge Plan:  [place]  In-House Referral:     Discharge planning Services  CM Consult  Post Acute Care Choice:    Choice offered to:  Patient  DME Arranged:  Oxygen DME Agency:  [place]:  PT [facility] Agency:  [place]  Status of Service:  Completed, signed off  If discussed at [place] of Stay Meetings, dates discussed:    Additional Comments:  Dessa Phi, RN 03/20/2016, 2:28 PM

## 2016-03-20 NOTE — Discharge Summary (Signed)
Discharge Summary  Sarah Mcguire [id number] DOB: 1941/02/24  PCP: Annye Asa, MD  Admit date: 03/18/2016 Discharge date: 03/20/2016   Recommendations for Outpatient Follow-up:  1. PCP in 1-2 weeks.   Discharge Diagnoses:  Active Hospital Problems   Diagnosis Date Noted  . Acute respiratory failure with hypoxia ([name]) 03/19/2016  . Hypoxia 03/19/2016    Resolved Hospital Problems   Diagnosis Date Noted Date Resolved  No resolved problems to display.    Discharge Condition: Stable   Diet recommendation: Cardiac diet   Filed Vitals:   03/20/16 0558 03/20/16 0715  BP: 121/76 101/67  Pulse: 81 86  Temp: 97.5 F (36.4 C)   Resp: 22 20    History of present illness:  Sarah Mcguire is a 75 y.o. female with medical history significant of severe anxiety, PE. Patient had a fall at home. She was unhurt, but unable to get up. EMS was called to the house. In the course of seeing her they checked vitals and her pulse ox was noted to be in the 80s on room air. She was brought in to the ED and admitted for new o2 requirement, wheezing felt to be undiagnosed COPD exacerbation.  Hospital Course:  Principal Problem:   Acute respiratory failure with hypoxia (HCC) Active Problems:   Hypoxia  Coumadin was continued, she had CT chest without PNA or PE, but with extensive emphysema. She was treated for COPD exacerbation with steroids, breathing treatments and supplemental O2. Today she is ambulating, feeling better, no cough. Husband is present. She feels ready to go home, will d/c with O2 if she qualifies as well as PO steroid taper. She needs outpatient PFTs for formal diagnosis and staging of her suspected COPD.  Procedures:  None   Consultations:  None   Discharge Exam: BP 101/67 mmHg  Pulse 86  Temp(Src) 97.5 F (36.4 C) (Oral)  Resp 20  Ht 5\' 3"  (1.6 m)  Wt 72.4 kg (159 lb 9.8 oz)  BMI 28.28 kg/m2  SpO2 93%  LMP  (LMP Unknown) General:  Alert, oriented,  calm, in no acute distress  Eyes: pupils round and reactive to light and accomodation, clear sclerea Neck: supple, no masses, trachea mildline  Cardiovascular: RRR, no murmurs or rubs, no peripheral edema  Respiratory: clear to auscultation bilaterally, no wheezes, no crackles, diminished sounds Abdomen: soft, nontender, nondistended, normal bowel tones heard  Skin: dry, no rashes  Musculoskeletal: no joint effusions, normal range of motion  Psychiatric: appropriate affect, normal speech  Neurologic: extraocular muscles intact, clear speech, moving all extremities with intact sensorium    Discharge Instructions You were cared for by a hospitalist during your hospital stay. If you have any questions about your discharge medications or the care you received while you were in the hospital after you are discharged, you can call the unit and asked to speak with the hospitalist on call if the hospitalist that took care of you is not available. Once you are discharged, your primary care physician will handle any further medical issues. Please note that NO REFILLS for any discharge medications will be authorized once you are discharged, as it is imperative that you return to your primary care physician (or establish a relationship with a primary care physician if you do not have one) for your aftercare needs so that they can reassess your need for medications and monitor your lab values.     Medication List    TAKE these medications  acetaminophen 325 MG tablet  Commonly known as:  TYLENOL  Take 325-650 mg by mouth every 6 (six) hours as needed (for headache).     albuterol 108 (90 Base) MCG/ACT inhaler  Commonly known as:  PROVENTIL HFA;VENTOLIN HFA  Inhale 2 puffs into the lungs every 6 (six) hours as needed for wheezing or shortness of breath.     alprazolam 2 MG tablet  Commonly known as:  XANAX  TAKE 1/2-1 TABLET BY MOUTH 3 TIMES A DAY AS NEEDED FOR SLEEP OR ANXIETY     clobetasol  cream 0.05 %  Commonly known as:  TEMOVATE  APPLY EXTERNALLY AT BEDTIME FOR ONE MONTH THEN AS NEEDED FOR ITCHING     diltiazem 120 MG 24 hr capsule  Commonly known as:  CARDIZEM CD  TAKE 1 CAPSULE (120 MG TOTAL) BY MOUTH DAILY.     docusate sodium 100 MG capsule  Commonly known as:  COLACE  Take 100 mg by mouth 2 (two) times daily as needed for mild constipation.     esomeprazole 40 MG capsule  Commonly known as:  NEXIUM  Take 1 capsule (40 mg total) by mouth daily as needed. For acid reflux.     fenofibrate 160 MG tablet  Take 1 tablet (160 mg total) by mouth daily.     folic acid 1 MG tablet  Commonly known as:  FOLVITE  Take 1 tablet (1 mg total) by mouth daily.     furosemide 40 MG tablet  Commonly known as:  LASIX  Take 1 tablet (40 mg total) by mouth daily.     glucose blood test strip  Commonly known as:  ONE TOUCH ULTRA TEST  TEST TWICE A DAY AS DIRECTED     HYDROcodone-acetaminophen 5-325 MG tablet  Commonly known as:  NORCO/VICODIN  Take 1-2 tablets by mouth every 4 (four) hours as needed.     levothyroxine 50 MCG tablet  Commonly known as:  SYNTHROID, LEVOTHROID  Take 1 tablet (50 mcg total) by mouth daily.     lidocaine 5 % ointment  Commonly known as:  XYLOCAINE  APPLY 1 APPLICATION TOPICALLY 3 (THREE) TIMES DAILY AS NEEDED. FOR ITCHING DIRECTLY TO THE SPOT     lidocaine 5 %  Commonly known as:  LIDODERM  Place 1 patch onto the skin daily as needed (for pain.). Remove & Discard patch within 12 hours or as directed by MD     lisinopril 10 MG tablet  Commonly known as:  PRINIVIL,ZESTRIL  Take 1 tablet (10 mg total) by mouth daily.     metFORMIN 500 MG 24 hr tablet  Commonly known as:  GLUCOPHAGE-XR  TAKE 2 TABLETS BY MOUTH IN THE MORNING WITH FOOD AND 1 TABLET IN THE EVENING WITH FOOD.     methylPREDNISolone 4 MG Tbpk tablet  Commonly known as:  MEDROL DOSEPAK  Taper per package instructions.     multivitamin tablet  Take 1 tablet by mouth daily.      nicotine 7 mg/24hr patch  Commonly known as:  NICODERM CQ - dosed in mg/24 hr  Place 2 patches (14 mg total) onto the skin daily.     nystatin powder  Generic drug:  nystatin  Apply to affected area twice daily     simvastatin 20 MG tablet  Commonly known as:  ZOCOR  TAKE 1 TABLET (20 MG TOTAL) BY MOUTH EVERY EVENING.     Vitamin D 2000 units tablet  Take 2,000 Units by mouth daily.  warfarin 5 MG tablet  Commonly known as:  COUMADIN  TAKE 1 TABLET ON TUES, THURS, SATURDAY AND SUNDAY, TAKE 1/2 TAB ON REMAINING DAYS       Allergies  Allergen Reactions  . Morphine And Related     Usually drops her blood pressure  . Amoxicillin Other (See Comments)    Unknown Has patient had a PCN reaction causing immediate rash, facial/tongue/throat swelling, SOB or lightheadedness with hypotension:unsure Has patient had a PCN reaction causing severe rash involving mucus membranes or skin necrosis:unsure Has patient had a PCN reaction that required hospitalization:unsure Has patient had a PCN reaction occurring within the last 10 years:unsure If all of the above answers are "NO", then may proceed with Cephalosporin use.     Marland Kitchen Amoxicillin-Pot Clavulanate Other (See Comments)    Has patient had a PCN reaction causing immediate rash, facial/tongue/throat swelling, SOB or lightheadedness with hypotension:unsure Has patient had a PCN reaction causing severe rash involving mucus membranes or skin necrosis:unsure Has patient had a PCN reaction that required hospitalization:unsure Has patient had a PCN reaction occurring within the last 10 years:unsure If all of the above answers are "NO", then may proceed with Cephalosporin use.    . Citalopram Hydrobromide Other (See Comments)    Itching   . Clonazepam Itching  . Cymbalta [Duloxetine Hcl] Other (See Comments)    Nausea, headache and diarrhea   . Macrobid [Nitrofurantoin Macrocrystal] Other (See Comments)    Nausea, vomitting  .  Penicillins Other (See Comments)    Unknown Has patient had a PCN reaction causing immediate rash, facial/tongue/throat swelling, SOB or lightheadedness with hypotension:unsure Has patient had a PCN reaction causing severe rash involving mucus membranes or skin necrosis:unsure Has patient had a PCN reaction that required hospitalization:unsure Has patient had a PCN reaction occurring within the last 10 years:unsure If all of the above answers are "NO", then may proceed with Cephalosporin use.      . Pyridium [Phenazopyridine Hcl] Other (See Comments)    headache  . Zonegran Other (See Comments)    Unknown   . Zonisamide Other (See Comments)    Unknown   . Sertraline Hcl Anxiety  . Wellbutrin [Bupropion] Itching and Rash    Itching, rash, hyper        Follow-up Information    Follow up with Annye Asa, MD In 2 weeks.   Specialty:  Family Medicine   Why:  For new dx of COPD, will also need outpt pulmonary consultation for PFTs.   Contact information:   [address] [place] [address] [phone]       Follow up with [facility].   Why:  HHPT   Contact information:   [address] [place] [address] [phone]        The results of significant diagnostics from this hospitalization (including imaging, microbiology, ancillary and laboratory) are listed below for reference.    Significant Diagnostic Studies: Dg Chest 2 View  03/19/2016  CLINICAL DATA:  Hypoxia EXAM: CHEST  2 VIEW COMPARISON:  05/23/2015 FINDINGS: Chronic cardiopericardial enlargement. Chronic hyperinflation with flat diaphragm. Stable increased markings at the bases with reticular pattern greater on the right. Patient has emphysema by 2013 chest CT. There is no edema, consolidation, effusion, or pneumothorax. 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 IMPRESSION: 1. No acute finding 2.  Emphysema. (ICD10-J43.9) 3. Stable cardiomegaly.  Electronically Signed   By: Monte Fantasia M.D.   On:  03/19/2016 01:57   Ct Angio Chest Pe W/cm &/or Wo Cm  03/19/2016  CLINICAL DATA:  Hypoxia.  Status post fall. EXAM: CT ANGIOGRAPHY CHEST WITH CONTRAST TECHNIQUE: Multidetector CT imaging of the chest was performed using the standard protocol during bolus administration of intravenous contrast. Multiplanar CT image reconstructions and MIPs were obtained to evaluate the vascular anatomy. CONTRAST:  100 mL of Isovue 370 COMPARISON:  June 26, 2012 FINDINGS: Central airways are normal. No pneumothorax. Emphysematous changes are seen within the lungs. Dependent bibasilar atelectasis also noted. No suspicious pulmonary nodules, masses, or focal infiltrates. The ascending thoracic aorta measures up to 4.2 cm in AP diameter. No dissection. Atherosclerotic change seen within the thoracic aorta. There are a few shotty nodes in the AP window and mediastinum, likely reactive. No new enlarged nodes identified. No pulmonary emboli. Evaluation of the the upper abdomen is unremarkable Degenerative changes in the spine.  No other bony abnormalities. Review of the MIP images confirms the above findings. IMPRESSION: 1. No pulmonary emboli. 2. Emphysema. 3. 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 4. Increased prominence as shotty mediastinal nodes, likely reactive. Recommend attention on follow-up. Electronically Signed   By: Dorise Bullion III M.D   On: 03/19/2016 03:56    Microbiology: No results found for this or any previous visit (from the past 240 hour(s)).   Labs: Basic Metabolic Panel:  Recent Labs Lab 03/14/16 1130 03/19/16 0017 03/20/16 0504  NA 141 139 139  K 4.5 3.7 4.1  CL 104 101 102  CO2 30 32 30  GLUCOSE 84 137* 179*  BUN 19 22* 24*  CREATININE 0.70 0.67 0.68  CALCIUM 10.5 9.9 9.3   Liver Function Tests:  Recent Labs Lab 03/14/16 1130  AST 16  ALT 14  ALKPHOS 45  BILITOT 0.3  PROT 6.7    ALBUMIN 4.1   No results for input(s): LIPASE, AMYLASE in the last 168 hours. No results for input(s): AMMONIA in the last 168 hours. CBC:  Recent Labs Lab 03/14/16 1130 03/19/16 0017 03/20/16 0504  WBC 7.6 7.5 5.9  NEUTROABS 4.9 5.1  --   HGB 13.9 13.1 11.6*  HCT 42.6 41.3 36.3  MCV 84.6 86.6 85.8  PLT 268.0 254 248   Cardiac Enzymes: No results for input(s): CKTOTAL, CKMB, CKMBINDEX, TROPONINI in the last 168 hours. BNP: BNP (last 3 results)  Recent Labs  03/19/16 0017  BNP 184.9*    ProBNP (last 3 results) No results for input(s): PROBNP in the last 8760 hours.  CBG: No results for input(s): GLUCAP in the last 168 hours.  Time spent: 31 minutes were spent in preparing this discharge including medication reconciliation, counseling, and coordination of care.  Signed:  [employer]  [facility] 03/20/2016, 2:25 PM

## 2016-03-21 ENCOUNTER — Telehealth: Payer: Self-pay | Admitting: *Deleted

## 2016-03-21 NOTE — Telephone Encounter (Signed)
Transition Care Management Follow-up Telephone Call  Admit date: 03/18/2016 Discharge date: 03/20/2016   Recommendations for Outpatient Follow-up:  1. PCP in 1-2 weeks.  Discharge Diagnoses:  Active Hospital Problems   Diagnosis Date Noted  . Acute respiratory failure with hypoxia ([place]) 03/19/2016  . Hypoxia 03/19/2016    Resolved Hospital Problems   Diagnosis Date Noted Date Resolved  No resolved problems to display.    Discharge Condition: Stable   Diet recommendation: Cardiac diet        --   How have you been since you were released from the hospital? "Terrible." Pt requested call be completed w/ her husband. Reports she is having no trouble w/ the oxygen, but is very tired and weak.   Do you understand why you were in the hospital? yes   Do you understand the discharge instructions? yes   Where were you discharged to? Home   Items Reviewed:  Medications reviewed: yes  Allergies reviewed: yes  Dietary changes reviewed: yes  Referrals reviewed: yes   Functional Questionnaire:   Activities of Daily Living (ADLs):   She states they are independent in the following: ambulation, feeding, continence, grooming and dressing States they require assistance with the following: bathing and hygiene and toileting-pt taking sponge baths because she cannot step into the shower   Any transportation issues/concerns?: yes   Any patient concerns? yes, pt on oral steroid taper. CBG last night 352, mid 200s this morning. Pt and husband would like to know if she should increase metformin while on PO steroids. Please advise. Pt has new albuterol inhaler and is not sure how to use it or set it up. Attempted to provide instructions via phone, but also advised them to take to the pharmacy for teaching or bring to appt. Pt is supposed to have HHPT and ? HHRN but have not been contacted by [place]. Provided pt's husband w/ phone number for [facility] and  advised him to contact them if they do not hear from them today.   Confirmed importance and date/time of follow-up visits scheduled yes  Provider Appointment booked with Dr. Annye Asa 03/23/16 @ 1:30pm  Confirmed with patient if condition begins to worsen call PCP or go to the ER.  Patient was given the office number and encouraged to call back with question or concerns.  : yes

## 2016-03-22 DIAGNOSIS — Z86711 Personal history of pulmonary embolism: Secondary | ICD-10-CM | POA: Diagnosis not present

## 2016-03-22 DIAGNOSIS — E1142 Type 2 diabetes mellitus with diabetic polyneuropathy: Secondary | ICD-10-CM | POA: Diagnosis not present

## 2016-03-22 DIAGNOSIS — K219 Gastro-esophageal reflux disease without esophagitis: Secondary | ICD-10-CM | POA: Diagnosis not present

## 2016-03-22 DIAGNOSIS — I10 Essential (primary) hypertension: Secondary | ICD-10-CM | POA: Diagnosis not present

## 2016-03-22 DIAGNOSIS — J449 Chronic obstructive pulmonary disease, unspecified: Secondary | ICD-10-CM | POA: Diagnosis not present

## 2016-03-22 DIAGNOSIS — Z7901 Long term (current) use of anticoagulants: Secondary | ICD-10-CM | POA: Diagnosis not present

## 2016-03-22 DIAGNOSIS — Z9981 Dependence on supplemental oxygen: Secondary | ICD-10-CM | POA: Diagnosis not present

## 2016-03-22 DIAGNOSIS — Z7984 Long term (current) use of oral hypoglycemic drugs: Secondary | ICD-10-CM | POA: Diagnosis not present

## 2016-03-22 DIAGNOSIS — I4891 Unspecified atrial fibrillation: Secondary | ICD-10-CM | POA: Diagnosis not present

## 2016-03-22 DIAGNOSIS — Z86718 Personal history of other venous thrombosis and embolism: Secondary | ICD-10-CM | POA: Diagnosis not present

## 2016-03-23 ENCOUNTER — Ambulatory Visit (INDEPENDENT_AMBULATORY_CARE_PROVIDER_SITE_OTHER): Payer: Medicare Other | Admitting: Family Medicine

## 2016-03-23 ENCOUNTER — Encounter: Payer: Self-pay | Admitting: Family Medicine

## 2016-03-23 VITALS — BP 118/70 | HR 73 | Temp 98.0°F | Resp 16 | Ht 63.0 in | Wt 157.5 lb

## 2016-03-23 DIAGNOSIS — J9601 Acute respiratory failure with hypoxia: Secondary | ICD-10-CM

## 2016-03-23 DIAGNOSIS — E1140 Type 2 diabetes mellitus with diabetic neuropathy, unspecified: Secondary | ICD-10-CM

## 2016-03-23 NOTE — Progress Notes (Signed)
   Subjective:    Patient ID: Sarah Mcguire, female    DOB: [birth year], 75 y.o.   MRN: [id number]  [facility] f/u- pt was admitted 7/16-18 after falling at home and EMS noting her O2 sats were in the 80s.  Pt reports she was not short of breath at that time and husband states 'she was breathing ok'.  Pt had CT done that showed extensive emphysema.  Pt improved w/ O2 and neb txs.  D/c'd home on Prednisone taper, albuterol inhaler, and O2- w/ recommendations for formal PFTs.  Pt would like to get rid of O2, 'i can't stand things on my head.  I'm terribly claustrophobic'.  Pt reports breathing is 'fine'.  HH came out last night.  Pt took O2 off for 5 minutes today and checked O2 sats 'they were in the 90's the whole time'.  Pt is concerned b/c sugars have been high on the prednisone.  Pt denies CP, SOB, confusion, dizziness.   Review of Systems For ROS see HPI      Objective:   Physical Exam  Constitutional: She is oriented to person, place, and time. She appears well-developed and well-nourished. No distress.  HENT:  Head: Normocephalic and atraumatic.  Eyes: Conjunctivae and EOM are normal. Pupils are equal, round, and reactive to light.  Neck: Normal range of motion. Neck supple. No thyromegaly present.  Cardiovascular: Normal rate, regular rhythm, normal heart sounds and intact distal pulses.   No murmur heard. Pulmonary/Chest: Effort normal and breath sounds normal. No respiratory distress. She has no wheezes. She has no rales.  Decreased air movement throughout but no wheezes or crackles  Abdominal: Soft. She exhibits no distension. There is no tenderness.  Musculoskeletal: She exhibits no edema.  Lymphadenopathy:    She has no cervical adenopathy.  Neurological: She is alert and oriented to person, place, and time.  Skin: Skin is warm and dry.  Psychiatric: She has a normal mood and affect. Her behavior is normal.  Vitals reviewed.         Assessment & Plan:

## 2016-03-23 NOTE — Patient Instructions (Signed)
Follow up as scheduled We'll call you with your pulmonary appt Use the albuterol inhaler- 2 puffs as needed for shortness of breath/wheezing If sitting at home resting, take off O2 and if O2 levels remain >92% you can leave the O2 off while at rest Use O2 while moving about or going out Call with any questions or concerns Hang in there!!!

## 2016-03-23 NOTE — Progress Notes (Signed)
Pre visit review using our clinic review tool, if applicable. No additional management support is needed unless otherwise documented below in the visit note. 

## 2016-03-26 ENCOUNTER — Ambulatory Visit (INDEPENDENT_AMBULATORY_CARE_PROVIDER_SITE_OTHER): Payer: Medicare Other | Admitting: General Practice

## 2016-03-26 DIAGNOSIS — Z86718 Personal history of other venous thrombosis and embolism: Secondary | ICD-10-CM | POA: Diagnosis not present

## 2016-03-26 DIAGNOSIS — I4891 Unspecified atrial fibrillation: Secondary | ICD-10-CM | POA: Diagnosis not present

## 2016-03-26 DIAGNOSIS — Z5181 Encounter for therapeutic drug level monitoring: Secondary | ICD-10-CM | POA: Diagnosis not present

## 2016-03-26 DIAGNOSIS — Z7901 Long term (current) use of anticoagulants: Secondary | ICD-10-CM | POA: Diagnosis not present

## 2016-03-26 DIAGNOSIS — E1142 Type 2 diabetes mellitus with diabetic polyneuropathy: Secondary | ICD-10-CM | POA: Diagnosis not present

## 2016-03-26 DIAGNOSIS — Z86711 Personal history of pulmonary embolism: Secondary | ICD-10-CM | POA: Diagnosis not present

## 2016-03-26 DIAGNOSIS — Z7984 Long term (current) use of oral hypoglycemic drugs: Secondary | ICD-10-CM | POA: Diagnosis not present

## 2016-03-26 DIAGNOSIS — I10 Essential (primary) hypertension: Secondary | ICD-10-CM | POA: Diagnosis not present

## 2016-03-26 DIAGNOSIS — J449 Chronic obstructive pulmonary disease, unspecified: Secondary | ICD-10-CM | POA: Diagnosis not present

## 2016-03-26 DIAGNOSIS — K219 Gastro-esophageal reflux disease without esophagitis: Secondary | ICD-10-CM | POA: Diagnosis not present

## 2016-03-26 DIAGNOSIS — Z9981 Dependence on supplemental oxygen: Secondary | ICD-10-CM | POA: Diagnosis not present

## 2016-03-26 LAB — POCT INR: INR: 3.8

## 2016-03-27 DIAGNOSIS — J449 Chronic obstructive pulmonary disease, unspecified: Secondary | ICD-10-CM | POA: Diagnosis not present

## 2016-03-27 DIAGNOSIS — E1142 Type 2 diabetes mellitus with diabetic polyneuropathy: Secondary | ICD-10-CM | POA: Diagnosis not present

## 2016-03-27 DIAGNOSIS — I10 Essential (primary) hypertension: Secondary | ICD-10-CM | POA: Diagnosis not present

## 2016-03-27 DIAGNOSIS — Z7984 Long term (current) use of oral hypoglycemic drugs: Secondary | ICD-10-CM | POA: Diagnosis not present

## 2016-03-27 DIAGNOSIS — I4891 Unspecified atrial fibrillation: Secondary | ICD-10-CM | POA: Diagnosis not present

## 2016-03-27 DIAGNOSIS — Z9981 Dependence on supplemental oxygen: Secondary | ICD-10-CM | POA: Diagnosis not present

## 2016-03-27 DIAGNOSIS — K219 Gastro-esophageal reflux disease without esophagitis: Secondary | ICD-10-CM | POA: Diagnosis not present

## 2016-03-27 DIAGNOSIS — Z86711 Personal history of pulmonary embolism: Secondary | ICD-10-CM | POA: Diagnosis not present

## 2016-03-27 DIAGNOSIS — Z86718 Personal history of other venous thrombosis and embolism: Secondary | ICD-10-CM | POA: Diagnosis not present

## 2016-03-27 DIAGNOSIS — Z7901 Long term (current) use of anticoagulants: Secondary | ICD-10-CM | POA: Diagnosis not present

## 2016-03-27 NOTE — Progress Notes (Signed)
I agree with this plan.

## 2016-03-28 DIAGNOSIS — E1142 Type 2 diabetes mellitus with diabetic polyneuropathy: Secondary | ICD-10-CM | POA: Diagnosis not present

## 2016-03-28 DIAGNOSIS — K219 Gastro-esophageal reflux disease without esophagitis: Secondary | ICD-10-CM | POA: Diagnosis not present

## 2016-03-28 DIAGNOSIS — I10 Essential (primary) hypertension: Secondary | ICD-10-CM | POA: Diagnosis not present

## 2016-03-28 DIAGNOSIS — J449 Chronic obstructive pulmonary disease, unspecified: Secondary | ICD-10-CM | POA: Diagnosis not present

## 2016-03-28 DIAGNOSIS — Z86711 Personal history of pulmonary embolism: Secondary | ICD-10-CM | POA: Diagnosis not present

## 2016-03-28 DIAGNOSIS — Z9981 Dependence on supplemental oxygen: Secondary | ICD-10-CM | POA: Diagnosis not present

## 2016-03-28 DIAGNOSIS — I4891 Unspecified atrial fibrillation: Secondary | ICD-10-CM | POA: Diagnosis not present

## 2016-03-28 DIAGNOSIS — Z86718 Personal history of other venous thrombosis and embolism: Secondary | ICD-10-CM | POA: Diagnosis not present

## 2016-03-28 DIAGNOSIS — Z7901 Long term (current) use of anticoagulants: Secondary | ICD-10-CM | POA: Diagnosis not present

## 2016-03-28 DIAGNOSIS — Z7984 Long term (current) use of oral hypoglycemic drugs: Secondary | ICD-10-CM | POA: Diagnosis not present

## 2016-03-29 DIAGNOSIS — Z9981 Dependence on supplemental oxygen: Secondary | ICD-10-CM | POA: Diagnosis not present

## 2016-03-29 DIAGNOSIS — E1142 Type 2 diabetes mellitus with diabetic polyneuropathy: Secondary | ICD-10-CM | POA: Diagnosis not present

## 2016-03-29 DIAGNOSIS — I10 Essential (primary) hypertension: Secondary | ICD-10-CM | POA: Diagnosis not present

## 2016-03-29 DIAGNOSIS — Z86718 Personal history of other venous thrombosis and embolism: Secondary | ICD-10-CM | POA: Diagnosis not present

## 2016-03-29 DIAGNOSIS — J449 Chronic obstructive pulmonary disease, unspecified: Secondary | ICD-10-CM | POA: Diagnosis not present

## 2016-03-29 DIAGNOSIS — Z86711 Personal history of pulmonary embolism: Secondary | ICD-10-CM | POA: Diagnosis not present

## 2016-03-29 DIAGNOSIS — Z7901 Long term (current) use of anticoagulants: Secondary | ICD-10-CM | POA: Diagnosis not present

## 2016-03-29 DIAGNOSIS — I4891 Unspecified atrial fibrillation: Secondary | ICD-10-CM | POA: Diagnosis not present

## 2016-03-29 DIAGNOSIS — K219 Gastro-esophageal reflux disease without esophagitis: Secondary | ICD-10-CM | POA: Diagnosis not present

## 2016-03-29 DIAGNOSIS — Z7984 Long term (current) use of oral hypoglycemic drugs: Secondary | ICD-10-CM | POA: Diagnosis not present

## 2016-03-30 DIAGNOSIS — K219 Gastro-esophageal reflux disease without esophagitis: Secondary | ICD-10-CM | POA: Diagnosis not present

## 2016-03-30 DIAGNOSIS — Z86718 Personal history of other venous thrombosis and embolism: Secondary | ICD-10-CM | POA: Diagnosis not present

## 2016-03-30 DIAGNOSIS — Z86711 Personal history of pulmonary embolism: Secondary | ICD-10-CM | POA: Diagnosis not present

## 2016-03-30 DIAGNOSIS — I4891 Unspecified atrial fibrillation: Secondary | ICD-10-CM | POA: Diagnosis not present

## 2016-03-30 DIAGNOSIS — Z7984 Long term (current) use of oral hypoglycemic drugs: Secondary | ICD-10-CM | POA: Diagnosis not present

## 2016-03-30 DIAGNOSIS — Z7901 Long term (current) use of anticoagulants: Secondary | ICD-10-CM | POA: Diagnosis not present

## 2016-03-30 DIAGNOSIS — Z9981 Dependence on supplemental oxygen: Secondary | ICD-10-CM | POA: Diagnosis not present

## 2016-03-30 DIAGNOSIS — J449 Chronic obstructive pulmonary disease, unspecified: Secondary | ICD-10-CM | POA: Diagnosis not present

## 2016-03-30 DIAGNOSIS — I10 Essential (primary) hypertension: Secondary | ICD-10-CM | POA: Diagnosis not present

## 2016-03-30 DIAGNOSIS — E1142 Type 2 diabetes mellitus with diabetic polyneuropathy: Secondary | ICD-10-CM | POA: Diagnosis not present

## 2016-04-01 NOTE — Assessment & Plan Note (Signed)
Chronic problem.  Reassured pt that elevated sugars on Prednisone is not surprising and once she is off the medication they will return to normal.  Encouraged low carb diet and reviewed sxs of hyperglycemia.  Will follow closely.

## 2016-04-01 NOTE — Assessment & Plan Note (Signed)
New.  Pt reports she is currently doing well but wants to stop her O2 as she feels claustrophobic with it on.  Encouraged her to work w/ HH to assess O2 sats while ambulating.  Instructed her that if sats remain >92% she can d/c O2.  Refer to pulm for ongoing assessment.  Pt expressed understanding and is in agreement w/ plan.

## 2016-04-02 ENCOUNTER — Encounter: Payer: Self-pay | Admitting: Pulmonary Disease

## 2016-04-02 ENCOUNTER — Ambulatory Visit (INDEPENDENT_AMBULATORY_CARE_PROVIDER_SITE_OTHER): Payer: Medicare Other | Admitting: Pulmonary Disease

## 2016-04-02 VITALS — BP 122/78 | HR 93 | Ht 63.0 in | Wt 154.0 lb

## 2016-04-02 DIAGNOSIS — J449 Chronic obstructive pulmonary disease, unspecified: Secondary | ICD-10-CM

## 2016-04-02 DIAGNOSIS — J439 Emphysema, unspecified: Secondary | ICD-10-CM | POA: Diagnosis not present

## 2016-04-02 DIAGNOSIS — J069 Acute upper respiratory infection, unspecified: Secondary | ICD-10-CM | POA: Diagnosis not present

## 2016-04-02 DIAGNOSIS — J9611 Chronic respiratory failure with hypoxia: Secondary | ICD-10-CM | POA: Diagnosis not present

## 2016-04-02 MED ORDER — UMECLIDINIUM-VILANTEROL 62.5-25 MCG/INH IN AEPB: 1.0000 | INHALATION_SPRAY | Freq: Every day | RESPIRATORY_TRACT | 0 refills | Status: DC

## 2016-04-02 NOTE — Progress Notes (Signed)
Subjective:    Patient ID: Sarah Mcguire, female    DOB: [birth year], 75 y.o.   MRN: [id number]  HPI   Chief Complaint  Patient presents with  . Pulmonary Consult    Referred by Dr. Birdie Riddle; [facility] and went to hospital, oxygen levels were in low 80's.  Was placed on oxygen, she wants to get off the oxygen, says it irritates her.       75 year old smoker who presents for evaluation of hypoxia. She was admitted from 7/16-7/18,where she had an accidental fall and was found to be hypoxic. CT angios and was negative and she was discharged on oxygen. She is to smoke about half pack per day-at least 30 pack years, and has now quit smoking for about a week. Was no loss of consciousness, she just eased herself to the floor and her husband could not wake her up hence, EMS was called. She reports left lower extreme edema DVT in 06/2012 after limb fracture and pulmonary embolism around the same time, she underwent left total knee replacement 11/2012 and has required a walker or cane since then to assist with ambulation She is ambulatory around the house but does not get out much  She denies wheezing, frequent chest colds or chest pain  She has severe anxiety and was visibly upset when I discussed her CT scan findings with her  Oxygen saturation was 85% on room air Spirometry showed a ratio of 41, FEV1 36%-0.72, FVC of 66%   Significant tests/ events   CT angio 03/2016 no PE, emphysema,4.2 cm ascending AA Echo 11/8012 EF 45%     Past Medical History:  Diagnosis Date  . Anemia   . Anxiety    SEVERE  . Arthritis   . Atrial fibrillation with RVR ([place]) 11/28/2012  . Diabetes mellitus    borderline no medications  . Diverticulitis   . DVT (deep venous thrombosis) ([place])   . GERD (gastroesophageal reflux disease)   . Hyperglycemia   . Hyperlipidemia   . Hypertension   . Leukocytosis   . Neuropathy ([place])   . PE (pulmonary embolism)    Bilateral PE, stringy saddle embolus with predominant  proximal lower lobe pulmonary artery involvement 06/26/12     Past Surgical History:  Procedure Laterality Date  . TONSILLECTOMY    . TOTAL KNEE ARTHROPLASTY Left 11/24/2012   Procedure: TOTAL KNEE ARTHROPLASTY;  Surgeon: Kerin Salen, MD;  Location: [facility];  Service: Orthopedics;  Laterality: Left;  . TYMPANOSTOMY TUBE PLACEMENT     R ear    Allergies  Allergen Reactions  . Morphine And Related     Usually drops her blood pressure  . Amoxicillin Other (See Comments)    Unknown Has patient had a PCN reaction causing immediate rash, facial/tongue/throat swelling, SOB or lightheadedness with hypotension:unsure Has patient had a PCN reaction causing severe rash involving mucus membranes or skin necrosis:unsure Has patient had a PCN reaction that required hospitalization:unsure Has patient had a PCN reaction occurring within the last 10 years:unsure If all of the above answers are "NO", then may proceed with Cephalosporin use.     Marland Kitchen Amoxicillin-Pot Clavulanate Other (See Comments)    Has patient had a PCN reaction causing immediate rash, facial/tongue/throat swelling, SOB or lightheadedness with hypotension:unsure Has patient had a PCN reaction causing severe rash involving mucus membranes or skin necrosis:unsure Has patient had a PCN reaction that required hospitalization:unsure Has patient had a PCN reaction occurring within the last 10 years:unsure If  all of the above answers are "NO", then may proceed with Cephalosporin use.    . Citalopram Hydrobromide Other (See Comments)    Itching   . Clonazepam Itching  . Cymbalta [Duloxetine Hcl] Other (See Comments)    Nausea, headache and diarrhea   . Macrobid [Nitrofurantoin Macrocrystal] Other (See Comments)    Nausea, vomitting  . Penicillins Other (See Comments)    Unknown Has patient had a PCN reaction causing immediate rash, facial/tongue/throat swelling, SOB or lightheadedness with hypotension:unsure Has patient had a PCN  reaction causing severe rash involving mucus membranes or skin necrosis:unsure Has patient had a PCN reaction that required hospitalization:unsure Has patient had a PCN reaction occurring within the last 10 years:unsure If all of the above answers are "NO", then may proceed with Cephalosporin use.      . Pyridium [Phenazopyridine Hcl] Other (See Comments)    headache  . Zonegran Other (See Comments)    Unknown   . Zonisamide Other (See Comments)    Unknown   . Sertraline Hcl Anxiety  . Wellbutrin [Bupropion] Itching and Rash    Itching, rash, hyper       Social History   Social History  . Marital status: Married    Spouse name: N/A  . Number of children: N/A  . Years of education: N/A   Occupational History  . Not on file.   Social History Main Topics  . Smoking status: Former Smoker    Packs/day: 0.50    Years: 50.00    Types: Cigarettes    Quit date: 03/18/2016  . Smokeless tobacco: Never Used  . Alcohol use No  . Drug use: No  . Sexual activity: Yes   Other Topics Concern  . Not on file   Social History Narrative  . No narrative on file     Family History  Problem Relation Age of Onset  . Diabetes Mother   . Hypertension Mother   . Heart disease Father     MI age 22's   . Diabetes Brother   . Hypertension Brother       Review of Systems  Constitutional: Negative for chills, fever and unexpected weight change.  HENT: Negative for congestion, dental problem, ear pain, nosebleeds, postnasal drip, rhinorrhea, sinus pressure, sneezing, sore throat, trouble swallowing and voice change.   Eyes: Negative for visual disturbance.  Respiratory: Negative for cough, choking and shortness of breath.   Cardiovascular: Negative for chest pain and leg swelling.  Gastrointestinal: Negative for abdominal pain, diarrhea and vomiting.  Genitourinary: Negative for difficulty urinating.  Musculoskeletal: Negative for arthralgias.  Skin: Negative for rash.    Neurological: Negative for tremors, syncope and headaches.  Hematological: Does not bruise/bleed easily.       Objective:   Physical Exam   Gen. Pleasant, obese, in no distress,anxious affect ENT - no lesions, no post nasal drip, class 2-3 airway Neck: No JVD, no thyromegaly, no carotid bruits Lungs: no use of accessory muscles, no dullness to percussion, decreased without rales or rhonchi  Cardiovascular: Rhythm regular, heart sounds  normal, no murmurs or gallops, 2+ peripheral edema L>> Rt Abdomen: soft and non-tender, no hepatosplenomegaly, BS normal. Musculoskeletal: No deformities, no cyanosis or clubbing Neuro:  alert, non focal, no tremors       Assessment & Plan:  s

## 2016-04-02 NOTE — Assessment & Plan Note (Signed)
Trial of Anoro high risk for relapse smoking cessation was again emphasized

## 2016-04-02 NOTE — Patient Instructions (Addendum)
You have severe emphysema Trial of ANORO once daily  Check nocturnal oximetry Return for oxygen check in 2 weeks

## 2016-04-02 NOTE — Assessment & Plan Note (Signed)
continue 2 L of oxygen at all times We will check nocturnal oximetry on 2 L to ensure correction during sleep. Probably a combination of emphysema and prior PE, no evidence of pulmonary hypertension  Prognosis is unclear-does not seem that there was any decompensation at the time of hospital admisssion, so not sure how much this will correct over time. We will recheck in 2 weeks now that she has quit smoking

## 2016-04-03 ENCOUNTER — Telehealth: Payer: Self-pay | Admitting: Family Medicine

## 2016-04-03 DIAGNOSIS — K219 Gastro-esophageal reflux disease without esophagitis: Secondary | ICD-10-CM | POA: Diagnosis not present

## 2016-04-03 DIAGNOSIS — I4891 Unspecified atrial fibrillation: Secondary | ICD-10-CM | POA: Diagnosis not present

## 2016-04-03 DIAGNOSIS — I10 Essential (primary) hypertension: Secondary | ICD-10-CM | POA: Diagnosis not present

## 2016-04-03 DIAGNOSIS — Z7984 Long term (current) use of oral hypoglycemic drugs: Secondary | ICD-10-CM | POA: Diagnosis not present

## 2016-04-03 DIAGNOSIS — J449 Chronic obstructive pulmonary disease, unspecified: Secondary | ICD-10-CM | POA: Diagnosis not present

## 2016-04-03 DIAGNOSIS — E1142 Type 2 diabetes mellitus with diabetic polyneuropathy: Secondary | ICD-10-CM | POA: Diagnosis not present

## 2016-04-03 DIAGNOSIS — Z7901 Long term (current) use of anticoagulants: Secondary | ICD-10-CM | POA: Diagnosis not present

## 2016-04-03 DIAGNOSIS — Z9981 Dependence on supplemental oxygen: Secondary | ICD-10-CM | POA: Diagnosis not present

## 2016-04-03 DIAGNOSIS — Z86718 Personal history of other venous thrombosis and embolism: Secondary | ICD-10-CM | POA: Diagnosis not present

## 2016-04-03 DIAGNOSIS — Z86711 Personal history of pulmonary embolism: Secondary | ICD-10-CM | POA: Diagnosis not present

## 2016-04-03 NOTE — Telephone Encounter (Signed)
Caller name: Margaretha Sheffield  Relation to pt: RN from [place] Call back number: [phone]  Pharmacy:  Reason for call:  RN wanted to inform PCP patient states she twisted her back while sleeping on Sunday and would like to speak with CMA

## 2016-04-04 ENCOUNTER — Encounter: Payer: Self-pay | Admitting: General Practice

## 2016-04-04 ENCOUNTER — Telehealth: Payer: Self-pay | Admitting: Pulmonary Disease

## 2016-04-04 DIAGNOSIS — E1142 Type 2 diabetes mellitus with diabetic polyneuropathy: Secondary | ICD-10-CM | POA: Diagnosis not present

## 2016-04-04 DIAGNOSIS — Z86711 Personal history of pulmonary embolism: Secondary | ICD-10-CM | POA: Diagnosis not present

## 2016-04-04 DIAGNOSIS — Z9981 Dependence on supplemental oxygen: Secondary | ICD-10-CM | POA: Diagnosis not present

## 2016-04-04 DIAGNOSIS — Z86718 Personal history of other venous thrombosis and embolism: Secondary | ICD-10-CM | POA: Diagnosis not present

## 2016-04-04 DIAGNOSIS — I10 Essential (primary) hypertension: Secondary | ICD-10-CM | POA: Diagnosis not present

## 2016-04-04 DIAGNOSIS — Z7984 Long term (current) use of oral hypoglycemic drugs: Secondary | ICD-10-CM | POA: Diagnosis not present

## 2016-04-04 DIAGNOSIS — J449 Chronic obstructive pulmonary disease, unspecified: Secondary | ICD-10-CM | POA: Diagnosis not present

## 2016-04-04 DIAGNOSIS — J439 Emphysema, unspecified: Secondary | ICD-10-CM

## 2016-04-04 DIAGNOSIS — K219 Gastro-esophageal reflux disease without esophagitis: Secondary | ICD-10-CM | POA: Diagnosis not present

## 2016-04-04 DIAGNOSIS — I4891 Unspecified atrial fibrillation: Secondary | ICD-10-CM | POA: Diagnosis not present

## 2016-04-04 DIAGNOSIS — Z7901 Long term (current) use of anticoagulants: Secondary | ICD-10-CM | POA: Diagnosis not present

## 2016-04-04 DIAGNOSIS — J9611 Chronic respiratory failure with hypoxia: Secondary | ICD-10-CM

## 2016-04-04 NOTE — Telephone Encounter (Signed)
Called and spoke with Norvel Richards at [facility] and she is aware that per RA that the ONO should be on 2 liters.  Will fax a new order to [facility]. I have called the pts husband and he is aware that the pt will need to wear her oxygen tonight while doing the test.

## 2016-04-04 NOTE — Telephone Encounter (Signed)
lidoderm patches are for skin sensitivities- not muscle issues.  A thermacare patch/wrap would be more beneficial for pt at this time (or a heating pad).

## 2016-04-04 NOTE — Telephone Encounter (Signed)
Spoke w/ Margaretha Sheffield, states when she was at pt's home yesterday she was having 10/10 back pain when sitting up straight, 5/10 pain when she leans forward. Reports pt would like refill of her lidocaine patch (previously filled by historical provider). Please advise.

## 2016-04-04 NOTE — Telephone Encounter (Signed)
Sarah Mcguire at number listed below and left message to return call.

## 2016-04-04 NOTE — Telephone Encounter (Signed)
ONO to be performed on 2 L of oxygen for adequacy

## 2016-04-04 NOTE — Telephone Encounter (Signed)
Pt's husband is concerned that ONO is to be ordered on RA, but patient wears 2lpm 02 24/7.  ONO is to be done tonight.   RA please clarify on ONO.  Thanks!

## 2016-04-04 NOTE — Telephone Encounter (Signed)
Called and left a detailed message for Sarah Mcguire to inform and also sent a mychart message to inform pt.

## 2016-04-06 ENCOUNTER — Telehealth: Payer: Self-pay | Admitting: Family Medicine

## 2016-04-06 DIAGNOSIS — J449 Chronic obstructive pulmonary disease, unspecified: Secondary | ICD-10-CM | POA: Diagnosis not present

## 2016-04-06 DIAGNOSIS — K219 Gastro-esophageal reflux disease without esophagitis: Secondary | ICD-10-CM | POA: Diagnosis not present

## 2016-04-06 DIAGNOSIS — Z9981 Dependence on supplemental oxygen: Secondary | ICD-10-CM | POA: Diagnosis not present

## 2016-04-06 DIAGNOSIS — E1142 Type 2 diabetes mellitus with diabetic polyneuropathy: Secondary | ICD-10-CM | POA: Diagnosis not present

## 2016-04-06 DIAGNOSIS — I509 Heart failure, unspecified: Secondary | ICD-10-CM | POA: Diagnosis not present

## 2016-04-06 DIAGNOSIS — Z7901 Long term (current) use of anticoagulants: Secondary | ICD-10-CM | POA: Diagnosis not present

## 2016-04-06 DIAGNOSIS — Z7984 Long term (current) use of oral hypoglycemic drugs: Secondary | ICD-10-CM | POA: Diagnosis not present

## 2016-04-06 DIAGNOSIS — F419 Anxiety disorder, unspecified: Secondary | ICD-10-CM | POA: Diagnosis not present

## 2016-04-06 DIAGNOSIS — Z86711 Personal history of pulmonary embolism: Secondary | ICD-10-CM | POA: Diagnosis not present

## 2016-04-06 DIAGNOSIS — I4891 Unspecified atrial fibrillation: Secondary | ICD-10-CM | POA: Diagnosis not present

## 2016-04-06 DIAGNOSIS — I10 Essential (primary) hypertension: Secondary | ICD-10-CM | POA: Diagnosis not present

## 2016-04-06 DIAGNOSIS — Z86718 Personal history of other venous thrombosis and embolism: Secondary | ICD-10-CM | POA: Diagnosis not present

## 2016-04-06 NOTE — Telephone Encounter (Signed)
Patient needs refill of HYDROcodone-acetaminophen (NORCO/VICODIN) 5-325 MG tablet.  Would like to pick rx up at [facility] office.  Please called when rx ready.

## 2016-04-07 DIAGNOSIS — J449 Chronic obstructive pulmonary disease, unspecified: Secondary | ICD-10-CM | POA: Diagnosis not present

## 2016-04-07 DIAGNOSIS — F419 Anxiety disorder, unspecified: Secondary | ICD-10-CM | POA: Diagnosis not present

## 2016-04-07 DIAGNOSIS — I4891 Unspecified atrial fibrillation: Secondary | ICD-10-CM | POA: Diagnosis not present

## 2016-04-07 DIAGNOSIS — Z86711 Personal history of pulmonary embolism: Secondary | ICD-10-CM | POA: Diagnosis not present

## 2016-04-07 DIAGNOSIS — Z9981 Dependence on supplemental oxygen: Secondary | ICD-10-CM | POA: Diagnosis not present

## 2016-04-07 DIAGNOSIS — K219 Gastro-esophageal reflux disease without esophagitis: Secondary | ICD-10-CM | POA: Diagnosis not present

## 2016-04-07 DIAGNOSIS — E1142 Type 2 diabetes mellitus with diabetic polyneuropathy: Secondary | ICD-10-CM | POA: Diagnosis not present

## 2016-04-07 DIAGNOSIS — Z86718 Personal history of other venous thrombosis and embolism: Secondary | ICD-10-CM | POA: Diagnosis not present

## 2016-04-07 DIAGNOSIS — Z7901 Long term (current) use of anticoagulants: Secondary | ICD-10-CM | POA: Diagnosis not present

## 2016-04-07 DIAGNOSIS — Z7984 Long term (current) use of oral hypoglycemic drugs: Secondary | ICD-10-CM | POA: Diagnosis not present

## 2016-04-07 DIAGNOSIS — I10 Essential (primary) hypertension: Secondary | ICD-10-CM | POA: Diagnosis not present

## 2016-04-09 ENCOUNTER — Telehealth: Payer: Self-pay | Admitting: Pulmonary Disease

## 2016-04-09 ENCOUNTER — Ambulatory Visit (INDEPENDENT_AMBULATORY_CARE_PROVIDER_SITE_OTHER): Payer: Medicare Other | Admitting: General Practice

## 2016-04-09 DIAGNOSIS — I4891 Unspecified atrial fibrillation: Secondary | ICD-10-CM

## 2016-04-09 DIAGNOSIS — Z5181 Encounter for therapeutic drug level monitoring: Secondary | ICD-10-CM | POA: Diagnosis not present

## 2016-04-09 LAB — POCT INR: INR: 7.3

## 2016-04-09 MED ORDER — HYDROCODONE-ACETAMINOPHEN 5-325 MG PO TABS: 1.0000 | ORAL_TABLET | ORAL | 0 refills | Status: DC | PRN

## 2016-04-09 NOTE — Telephone Encounter (Signed)
Extremely unlikely it's anoro so she should see whomever she sees for adjustments on her diuretics

## 2016-04-09 NOTE — Progress Notes (Signed)
I agree with this plan.

## 2016-04-09 NOTE — Telephone Encounter (Signed)
Last OV 03/23/16 Hydrocodone last filled 03/02/16 #90 with 0

## 2016-04-09 NOTE — Telephone Encounter (Signed)
Called and spoke with pts husband and he stated that the pt has been having swelling in her ankles and lower legs since 7/31 when she was started on anoro.  Pt is not c/o of any SOB or any other complaints.  She did have her coumadin checked today and it was very high--her INR was 7.0.  They feel that it is related to the anoro, but wanted to check with RA to see what he thought.  RA is out of the office today.  Will forward to DOD---Dr. Melvyn Novas.  Please advise. thanks

## 2016-04-09 NOTE — Patient Instructions (Signed)
Pre visit review using our clinic review tool, if applicable. No additional management support is needed unless otherwise documented below in the visit note. INR is high today.  Coumadin is being held through Thursday.  INR will be re-checked on Friday.  Patient and husband say that pt has not deviated from dosing instructions and pt denies any new medications or supplements.  Patient does use a pill box.  Reiterated the risks of having a supra-therapeutic INR.  Patient encouraged to go to ER if any unusual bleeding occurs.

## 2016-04-09 NOTE — Telephone Encounter (Signed)
Med filled and sent to [facility] as requested.

## 2016-04-09 NOTE — Telephone Encounter (Signed)
Ok to refill and pick up at [facility]

## 2016-04-09 NOTE — Telephone Encounter (Signed)
Spoke with pt's husband and advised as to [facility] reply regarding the Anoro. Pt's husband states they have not talked to Dr. Virgil Benedict office. Advised they may want to call there since they are the ones that order/monitor her INR and her Lasix. Pt's husband states they will contact them. Nothing further needed.

## 2016-04-10 ENCOUNTER — Telehealth: Payer: Self-pay | Admitting: Family Medicine

## 2016-04-10 ENCOUNTER — Encounter: Payer: Self-pay | Admitting: General Practice

## 2016-04-10 DIAGNOSIS — J449 Chronic obstructive pulmonary disease, unspecified: Secondary | ICD-10-CM | POA: Diagnosis not present

## 2016-04-10 DIAGNOSIS — E1142 Type 2 diabetes mellitus with diabetic polyneuropathy: Secondary | ICD-10-CM | POA: Diagnosis not present

## 2016-04-10 DIAGNOSIS — I10 Essential (primary) hypertension: Secondary | ICD-10-CM | POA: Diagnosis not present

## 2016-04-10 DIAGNOSIS — F419 Anxiety disorder, unspecified: Secondary | ICD-10-CM | POA: Diagnosis not present

## 2016-04-10 NOTE — Telephone Encounter (Signed)
I am in agreement w/ her specialists and at this time, I don't recommend anything differently w/ the exception of elevating her legs as much as possible

## 2016-04-10 NOTE — Telephone Encounter (Signed)
Husband states that pt oxygen level was low and pulmonary Dr has her on a new inhaler anoroellipta, pt has then started swelling in her legs and was told to increase her lasix which she has. Pt then had PT/INR done. It was 7.0 and now she is not taking her coumadin and will have it rechecked on Fri. Husband is asking what KT thinks and is there anything different they can do for the swelling

## 2016-04-10 NOTE — Telephone Encounter (Signed)
mychart message placed to inform pt family.

## 2016-04-11 DIAGNOSIS — Z7901 Long term (current) use of anticoagulants: Secondary | ICD-10-CM | POA: Diagnosis not present

## 2016-04-11 DIAGNOSIS — J449 Chronic obstructive pulmonary disease, unspecified: Secondary | ICD-10-CM | POA: Diagnosis not present

## 2016-04-11 DIAGNOSIS — I10 Essential (primary) hypertension: Secondary | ICD-10-CM | POA: Diagnosis not present

## 2016-04-11 DIAGNOSIS — E1142 Type 2 diabetes mellitus with diabetic polyneuropathy: Secondary | ICD-10-CM | POA: Diagnosis not present

## 2016-04-11 DIAGNOSIS — Z9981 Dependence on supplemental oxygen: Secondary | ICD-10-CM | POA: Diagnosis not present

## 2016-04-11 DIAGNOSIS — Z86718 Personal history of other venous thrombosis and embolism: Secondary | ICD-10-CM | POA: Diagnosis not present

## 2016-04-11 DIAGNOSIS — I4891 Unspecified atrial fibrillation: Secondary | ICD-10-CM | POA: Diagnosis not present

## 2016-04-11 DIAGNOSIS — F419 Anxiety disorder, unspecified: Secondary | ICD-10-CM | POA: Diagnosis not present

## 2016-04-11 DIAGNOSIS — K219 Gastro-esophageal reflux disease without esophagitis: Secondary | ICD-10-CM | POA: Diagnosis not present

## 2016-04-11 DIAGNOSIS — Z7984 Long term (current) use of oral hypoglycemic drugs: Secondary | ICD-10-CM | POA: Diagnosis not present

## 2016-04-11 DIAGNOSIS — Z86711 Personal history of pulmonary embolism: Secondary | ICD-10-CM | POA: Diagnosis not present

## 2016-04-12 ENCOUNTER — Telehealth: Payer: Self-pay | Admitting: Pulmonary Disease

## 2016-04-12 DIAGNOSIS — Z9981 Dependence on supplemental oxygen: Secondary | ICD-10-CM | POA: Diagnosis not present

## 2016-04-12 DIAGNOSIS — E1142 Type 2 diabetes mellitus with diabetic polyneuropathy: Secondary | ICD-10-CM | POA: Diagnosis not present

## 2016-04-12 DIAGNOSIS — J449 Chronic obstructive pulmonary disease, unspecified: Secondary | ICD-10-CM | POA: Diagnosis not present

## 2016-04-12 DIAGNOSIS — K219 Gastro-esophageal reflux disease without esophagitis: Secondary | ICD-10-CM | POA: Diagnosis not present

## 2016-04-12 DIAGNOSIS — Z86711 Personal history of pulmonary embolism: Secondary | ICD-10-CM | POA: Diagnosis not present

## 2016-04-12 DIAGNOSIS — Z7984 Long term (current) use of oral hypoglycemic drugs: Secondary | ICD-10-CM | POA: Diagnosis not present

## 2016-04-12 DIAGNOSIS — F419 Anxiety disorder, unspecified: Secondary | ICD-10-CM | POA: Diagnosis not present

## 2016-04-12 DIAGNOSIS — I10 Essential (primary) hypertension: Secondary | ICD-10-CM | POA: Diagnosis not present

## 2016-04-12 DIAGNOSIS — Z86718 Personal history of other venous thrombosis and embolism: Secondary | ICD-10-CM | POA: Diagnosis not present

## 2016-04-12 DIAGNOSIS — Z7901 Long term (current) use of anticoagulants: Secondary | ICD-10-CM | POA: Diagnosis not present

## 2016-04-12 DIAGNOSIS — I4891 Unspecified atrial fibrillation: Secondary | ICD-10-CM | POA: Diagnosis not present

## 2016-04-12 NOTE — Telephone Encounter (Signed)
Per Dr. Elsworth Soho, overnight oximetry results show mild desat on 2L, continue 2L. --------------------------- Called and left message with spouse for patient to return call.  Awaiting return call.

## 2016-04-13 ENCOUNTER — Ambulatory Visit (INDEPENDENT_AMBULATORY_CARE_PROVIDER_SITE_OTHER): Payer: Self-pay | Admitting: General Practice

## 2016-04-13 DIAGNOSIS — I4891 Unspecified atrial fibrillation: Secondary | ICD-10-CM

## 2016-04-13 DIAGNOSIS — Z5181 Encounter for therapeutic drug level monitoring: Secondary | ICD-10-CM

## 2016-04-13 LAB — POCT INR: INR: 1.9

## 2016-04-13 NOTE — Telephone Encounter (Signed)
Pt cb to get results, [phone]

## 2016-04-13 NOTE — Telephone Encounter (Signed)
Called and spoke with pts husband and he stated that the pt has been using the oxgyen 24/7.  He is aware that she will need to use the oxygen the same for now and keep the appt with TP on Tuesday.

## 2016-04-13 NOTE — Progress Notes (Signed)
I have reviewed and agree with the plan. 

## 2016-04-13 NOTE — Telephone Encounter (Signed)
Patient's husband calling [phone].  States Sarah Mcguire spoke with wife (pt) this am and he wants to verify when she is to use her O2.

## 2016-04-13 NOTE — Telephone Encounter (Signed)
Patient notified of results. Nothing further needed.  

## 2016-04-13 NOTE — Patient Instructions (Signed)
Pre visit review using our clinic review tool, if applicable. No additional management support is needed unless otherwise documented below in the visit.  

## 2016-04-16 ENCOUNTER — Encounter: Payer: Self-pay | Admitting: Family Medicine

## 2016-04-16 ENCOUNTER — Ambulatory Visit (INDEPENDENT_AMBULATORY_CARE_PROVIDER_SITE_OTHER): Payer: Medicare Other | Admitting: Family Medicine

## 2016-04-16 VITALS — BP 122/82 | HR 94 | Temp 98.0°F | Resp 17 | Ht 63.0 in | Wt 160.5 lb

## 2016-04-16 DIAGNOSIS — R600 Localized edema: Secondary | ICD-10-CM

## 2016-04-16 LAB — BASIC METABOLIC PANEL
BUN: 21 mg/dL (ref 7–25)
CALCIUM: 9.9 mg/dL (ref 8.6–10.4)
CO2: 30 mmol/L (ref 20–31)
CREATININE: 1.07 mg/dL — AB (ref 0.60–0.93)
Chloride: 102 mmol/L (ref 98–110)
Glucose, Bld: 120 mg/dL — ABNORMAL HIGH (ref 65–99)
POTASSIUM: 4.6 mmol/L (ref 3.5–5.3)
Sodium: 141 mmol/L (ref 135–146)

## 2016-04-16 LAB — CBC WITH DIFFERENTIAL/PLATELET
Basophils Absolute: 75 cells/uL (ref 0–200)
Basophils Relative: 1 %
EOS PCT: 3 %
Eosinophils Absolute: 225 cells/uL (ref 15–500)
HEMATOCRIT: 36.6 % (ref 35.0–45.0)
HEMOGLOBIN: 11.9 g/dL (ref 11.7–15.5)
LYMPHS ABS: 2250 {cells}/uL (ref 850–3900)
Lymphocytes Relative: 30 %
MCH: 26.7 pg — ABNORMAL LOW (ref 27.0–33.0)
MCHC: 32.5 g/dL (ref 32.0–36.0)
MCV: 82.1 fL (ref 80.0–100.0)
MPV: 10.9 fL (ref 7.5–12.5)
Monocytes Absolute: 750 cells/uL (ref 200–950)
Monocytes Relative: 10 %
NEUTROS ABS: 4200 {cells}/uL (ref 1500–7800)
Neutrophils Relative %: 56 %
Platelets: 388 10*3/uL (ref 140–400)
RBC: 4.46 MIL/uL (ref 3.80–5.10)
RDW: 15.5 % — ABNORMAL HIGH (ref 11.0–15.0)
WBC: 7.5 10*3/uL (ref 3.8–10.8)

## 2016-04-16 LAB — HEPATIC FUNCTION PANEL
ALT: 10 U/L (ref 6–29)
AST: 16 U/L (ref 10–35)
Albumin: 3.8 g/dL (ref 3.6–5.1)
Alkaline Phosphatase: 57 U/L (ref 33–130)
BILIRUBIN DIRECT: 0.1 mg/dL (ref <=0.2)
BILIRUBIN TOTAL: 0.2 mg/dL (ref 0.2–1.2)
Indirect Bilirubin: 0.1 mg/dL — ABNORMAL LOW (ref 0.2–1.2)
Total Protein: 6.2 g/dL (ref 6.1–8.1)

## 2016-04-16 LAB — TSH: TSH: 2.47 m[IU]/L

## 2016-04-16 NOTE — Progress Notes (Signed)
   Subjective:    Patient ID: Sarah Mcguire, female    DOB: 03-31-1941, 75 y.o.   MRN: [id number]  HPI Swelling- pt reports swelling started at same time she started Anoro.  No other medication changes.  Denies dietary changes but pt reports eating [place] food 2-3x recently.  Pt reports good water intake.  Currently taking 1/2 tab (20mg ) Lasix daily.  Denies SOB, swelling of hands.  Will only intermittently elevate legs.    Review of Systems For ROS see HPI     Objective:   Physical Exam  Constitutional: She is oriented to person, place, and time. She appears well-developed and well-nourished. No distress.  HENT:  Head: Normocephalic and atraumatic.  Neck: Normal range of motion. Neck supple. No thyromegaly present.  Cardiovascular: Normal rate, regular rhythm and normal heart sounds.   Pulmonary/Chest: Effort normal and breath sounds normal. No respiratory distress. She has no wheezes. She has no rales.  Musculoskeletal: She exhibits edema and tenderness (1+ edema bilaterally, L>R).  Lymphadenopathy:    She has no cervical adenopathy.  Neurological: She is alert and oriented to person, place, and time.  Skin: Skin is warm and dry.  Psychiatric: She has a normal mood and affect. Her behavior is normal. Thought content normal.  Vitals reviewed.         Assessment & Plan:

## 2016-04-16 NOTE — Patient Instructions (Addendum)
Follow up as scheduled We'll notify you of your lab results and make any changes if needed Increase the Lasix to 1 tab daily for at least the next 3 days Limit your salt intake, increase your water intake Elevate your legs while seated Call with any questions or concerns Hang in there!!!

## 2016-04-16 NOTE — Progress Notes (Signed)
Pre visit review using our clinic review tool, if applicable. No additional management support is needed unless otherwise documented below in the visit note. 

## 2016-04-17 ENCOUNTER — Encounter: Payer: Self-pay | Admitting: Adult Health

## 2016-04-17 ENCOUNTER — Ambulatory Visit (INDEPENDENT_AMBULATORY_CARE_PROVIDER_SITE_OTHER): Payer: Medicare Other | Admitting: Adult Health

## 2016-04-17 DIAGNOSIS — J9611 Chronic respiratory failure with hypoxia: Secondary | ICD-10-CM | POA: Diagnosis not present

## 2016-04-17 DIAGNOSIS — J449 Chronic obstructive pulmonary disease, unspecified: Secondary | ICD-10-CM

## 2016-04-17 MED ORDER — ALBUTEROL SULFATE HFA 108 (90 BASE) MCG/ACT IN AERS: 2.0000 | INHALATION_SPRAY | Freq: Four times a day (QID) | RESPIRATORY_TRACT | 2 refills | Status: AC | PRN

## 2016-04-17 MED ORDER — UMECLIDINIUM-VILANTEROL 62.5-25 MCG/INH IN AEPB: 1.0000 | INHALATION_SPRAY | Freq: Every day | RESPIRATORY_TRACT | 5 refills | Status: DC

## 2016-04-17 NOTE — Progress Notes (Signed)
Subjective:    Patient ID: Sarah Mcguire, female    DOB: 10-13-1940, 75 y.o.   MRN: [id number]  HPI 75 yo female former smoker seen for pulmonary consult 04/02/16 for hypoxia   She was admitted from 7/16-7/18,where she had an accidental fall and was found to be hypoxic. CT angios and was negative and she was discharged on oxygen.   TEST  Spirometry 03/2016 showed a ratio of 41, FEV1 36%-0.72, FVC of 66% CT angio 03/2016 no PE, emphysema,4.2 cm ascending AA Echo 11/8012 EF 45%     04/17/2016 Follow up : Severe COPD  Pt returns for 2 weeks follow up . Seen last ov with pulmonary consult  Spirometry showed severe COPD . She was continued on O2 .  ONO showed mild +desats, she was continued on O2 2l/m At bedtime  .  She has not quit smoking .  She was started on [facility] daily .  Feels that her breathing is doing better overall she does not want to wear the o2 all the time.  O2 sitting at rest on RA was >90%. Discussed she can remove if O2 sats are >90% at rest .  Denies chest pain, orthopnea, edema or fever.      Past Medical History:  Diagnosis Date  . Anemia   . Anxiety    SEVERE  . Arthritis   . Atrial fibrillation with RVR ([place]) 11/28/2012  . Diabetes mellitus    borderline no medications  . Diverticulitis   . DVT (deep venous thrombosis) ([place])   . GERD (gastroesophageal reflux disease)   . Hyperglycemia   . Hyperlipidemia   . Hypertension   . Leukocytosis   . Neuropathy ([place])   . PE (pulmonary embolism)    Bilateral PE, stringy saddle embolus with predominant proximal lower lobe pulmonary artery involvement 06/26/12   Current Outpatient Prescriptions on File Prior to Visit  Medication Sig Dispense Refill  . acetaminophen (TYLENOL) 325 MG tablet Take 325-650 mg by mouth every 6 (six) hours as needed (for headache).     Marland Kitchen alprazolam (XANAX) 2 MG tablet TAKE 1/2-1 TABLET BY MOUTH 3 TIMES A DAY AS NEEDED FOR SLEEP OR ANXIETY (Patient taking differently: Take 1 mg by mouth  6 (six) times daily. ) 90 tablet 1  . Cholecalciferol (VITAMIN D) 2000 UNITS tablet Take 2,000 Units by mouth daily.    . clobetasol cream (TEMOVATE) 0.05 % APPLY EXTERNALLY AT BEDTIME FOR ONE MONTH THEN AS NEEDED FOR ITCHING 60 g 0  . diltiazem (CARDIZEM CD) 120 MG 24 hr capsule TAKE 1 CAPSULE (120 MG TOTAL) BY MOUTH DAILY. 90 capsule 3  . docusate sodium (COLACE) 100 MG capsule Take 100 mg by mouth 2 (two) times daily as needed for mild constipation. Reported on 03/21/2016    . esomeprazole (NEXIUM) 40 MG capsule Take 1 capsule (40 mg total) by mouth daily as needed. For acid reflux. (Patient taking differently: Take 40 mg by mouth daily before breakfast. For acid reflux.) 90 capsule 1  . fenofibrate 160 MG tablet Take 1 tablet (160 mg total) by mouth daily. 90 tablet 1  . folic acid (FOLVITE) 1 MG tablet Take 1 tablet (1 mg total) by mouth daily. 90 tablet 1  . furosemide (LASIX) 40 MG tablet Take 1 tablet (40 mg total) by mouth daily. (Patient taking differently: Take 10 mg by mouth daily with breakfast. ) 90 tablet 3  . glucose blood (ONE TOUCH ULTRA TEST) test strip TEST TWICE A  DAY AS DIRECTED 100 each 12  . HYDROcodone-acetaminophen (NORCO/VICODIN) 5-325 MG tablet Take 1-2 tablets by mouth every 4 (four) hours as needed. 90 tablet 0  . levothyroxine (SYNTHROID, LEVOTHROID) 50 MCG tablet Take 1 tablet (50 mcg total) by mouth daily. (Patient taking differently: Take 50 mcg by mouth daily before breakfast. ) 90 tablet 1  . lidocaine (LIDODERM) 5 % Place 1 patch onto the skin daily as needed (for pain.). Remove & Discard patch within 12 hours or as directed by MD    . lidocaine (XYLOCAINE) 5 % ointment APPLY 1 APPLICATION TOPICALLY 3 (THREE) TIMES DAILY AS NEEDED. FOR ITCHING DIRECTLY TO THE SPOT 35.44 g 0  . lisinopril (PRINIVIL,ZESTRIL) 10 MG tablet Take 1 tablet (10 mg total) by mouth daily. 90 tablet 1  . metFORMIN (GLUCOPHAGE-XR) 500 MG 24 hr tablet TAKE 2 TABLETS BY MOUTH IN THE MORNING WITH  FOOD AND 1 TABLET IN THE EVENING WITH FOOD. 270 tablet 1  . Multiple Vitamin (MULTIVITAMIN) tablet Take 1 tablet by mouth daily.      . nicotine (NICODERM CQ - DOSED IN MG/24 HR) 7 mg/24hr patch Place 2 patches (14 mg total) onto the skin daily. 28 patch 0  . nystatin (MYCOSTATIN/NYSTOP) 100000 UNIT/GM POWD Apply to affected area twice daily (Patient taking differently: Apply 1 g topically 2 (two) times daily as needed (for irritation of skin folds). Apply to affected area twice daily) 60 g 3  . simvastatin (ZOCOR) 20 MG tablet TAKE 1 TABLET (20 MG TOTAL) BY MOUTH EVERY EVENING. 90 tablet 1  . umeclidinium-vilanterol (ANORO ELLIPTA) 62.5-25 MCG/INH AEPB Inhale 1 puff into the lungs daily. 1 each 0  . warfarin (COUMADIN) 5 MG tablet TAKE 1 TABLET ON TUES, THURS, SATURDAY AND SUNDAY, TAKE 1/2 TAB ON REMAINING DAYS (Patient taking differently: TAKE 5 mg TUES, THURS, SAT, SUN. TAKE 2.5 MG MON, FRI. NO COUMADIN ON WED.) 90 tablet 1   No current facility-administered medications on file prior to visit.     Review of Systems Constitutional:   No  weight loss, night sweats,  Fevers, chills,  +fatigue, or  lassitude.  HEENT:   No headaches,  Difficulty swallowing,  Tooth/dental problems, or  Sore throat,                No sneezing, itching, ear ache, nasal congestion, post nasal drip,   CV:  No chest pain,  Orthopnea, PND, swelling in lower extremities, anasarca, dizziness, palpitations, syncope.   GI  No heartburn, indigestion, abdominal pain, nausea, vomiting, diarrhea, change in bowel habits, loss of appetite, bloody stools.   Resp:    No chest wall deformity  Skin: no rash or lesions.  GU: no dysuria, change in color of urine, no urgency or frequency.  No flank pain, no hematuria   MS:  No joint pain or swelling.  No decreased range of motion.  No back pain.  Psych:  No change in mood or affect. No depression or anxiety.  No memory loss.         Objective:   Physical Exam Vitals:    04/17/16 1134  BP: 110/72  Pulse: 92  Temp: 98 F (36.7 C)  TempSrc: Oral  SpO2: 93%  Weight: 162 lb (73.5 kg)  Height: 5\' 3"  (1.6 m)   GEN: A/Ox3; pleasant , NAD, elderly    HEENT:  [place]/AT,  EACs-clear, TMs-wnl, NOSE-clear, THROAT-clear, no lesions, no postnasal drip or exudate noted.   NECK:  Supple w/ fair ROM;  no JVD; normal carotid impulses w/o bruits; no thyromegaly or nodules palpated; no lymphadenopathy.    RESP decreased BS in bases , . no accessory muscle use, no dullness to percussion  CARD:  RRR, no m/r/g  , no peripheral edema, pulses intact, no cyanosis or clubbing.  GI:   Soft & nt; nml bowel sounds; no organomegaly or masses detected.   Musco: Warm bil, no deformities or joint swelling noted.   Neuro: alert, no focal deficits noted.    Skin: Warm, no lesions or rashes  Etha Stambaugh NP-C  [facility]  04/17/2016       Assessment & Plan:

## 2016-04-17 NOTE — Patient Instructions (Addendum)
Wear oxygen 2l/m with activity and At bedtime  .  Continue on ANORO 1 puff daily  Follow up with Dr. Elsworth Soho  In 3 months and As needed

## 2016-04-17 NOTE — Assessment & Plan Note (Signed)
Patient Instructions  Wear oxygen 2l/m with activity and At bedtime  .  Continue on ANORO 1 puff daily  Follow up with Dr. Elsworth Soho  In 3 months and As needed

## 2016-04-17 NOTE — Assessment & Plan Note (Signed)
Deteriorated.  Stressed that this was not a result of starting Anoro inhaler but pt's husband is convinced otherwise.  Informed them that I would not stop the inhaler for 2 reasons- 1) I didn't prescribe it and she was seeing a specialist for her pulmonary issues who would need to make that decision 2) I don't think this is the cause of her swelling.  I suspect that her swelling is due to increased Na intake in the form of takeout and restaurant food, increased heat/humidity, limited mobility, and the fact that she doesn't elevate her legs when sitting.  Pt to increase lasix to 1 tab daily x3 days and we will get labs to r/o thyroid abnormality, anemia, or electrolyte imbalance as cause of swelling.  Pt expressed understanding and is in agreement w/ plan.

## 2016-04-18 ENCOUNTER — Encounter: Payer: Self-pay | Admitting: Pulmonary Disease

## 2016-04-19 ENCOUNTER — Telehealth: Payer: Self-pay | Admitting: Pulmonary Disease

## 2016-04-19 NOTE — Telephone Encounter (Signed)
Wear oxygen 2l/m with activity and At bedtime  .  Continue on ANORO 1 puff daily  Follow up with Dr. Elsworth Soho  In 3 months and As needed   Called and spoke with pts husband and he is aware of TP recs from her appt on 8/15

## 2016-04-20 ENCOUNTER — Ambulatory Visit (INDEPENDENT_AMBULATORY_CARE_PROVIDER_SITE_OTHER): Payer: Medicare Other | Admitting: General Practice

## 2016-04-20 DIAGNOSIS — I4891 Unspecified atrial fibrillation: Secondary | ICD-10-CM | POA: Diagnosis not present

## 2016-04-20 DIAGNOSIS — J449 Chronic obstructive pulmonary disease, unspecified: Secondary | ICD-10-CM | POA: Diagnosis not present

## 2016-04-20 DIAGNOSIS — Z5181 Encounter for therapeutic drug level monitoring: Secondary | ICD-10-CM | POA: Diagnosis not present

## 2016-04-20 LAB — POCT INR: INR: 2.8

## 2016-04-20 NOTE — Progress Notes (Signed)
I have reviewed and agree with the plan. 

## 2016-04-23 ENCOUNTER — Other Ambulatory Visit: Payer: Self-pay | Admitting: Family Medicine

## 2016-04-23 DIAGNOSIS — R609 Edema, unspecified: Secondary | ICD-10-CM

## 2016-05-07 ENCOUNTER — Emergency Department (HOSPITAL_COMMUNITY): Payer: Medicare Other

## 2016-05-07 ENCOUNTER — Observation Stay (HOSPITAL_COMMUNITY): Payer: Medicare Other

## 2016-05-07 ENCOUNTER — Encounter (HOSPITAL_COMMUNITY): Payer: Self-pay | Admitting: *Deleted

## 2016-05-07 ENCOUNTER — Observation Stay (HOSPITAL_COMMUNITY)
Admission: EM | Admit: 2016-05-07 | Discharge: 2016-05-08 | Disposition: A | Discharge status: Home / Self Care | Payer: Medicare Other | Attending: Internal Medicine | Admitting: Internal Medicine

## 2016-05-07 DIAGNOSIS — M549 Dorsalgia, unspecified: Secondary | ICD-10-CM | POA: Insufficient documentation

## 2016-05-07 DIAGNOSIS — Z86718 Personal history of other venous thrombosis and embolism: Secondary | ICD-10-CM | POA: Diagnosis not present

## 2016-05-07 DIAGNOSIS — Z9981 Dependence on supplemental oxygen: Secondary | ICD-10-CM | POA: Diagnosis not present

## 2016-05-07 DIAGNOSIS — M1990 Unspecified osteoarthritis, unspecified site: Secondary | ICD-10-CM | POA: Insufficient documentation

## 2016-05-07 DIAGNOSIS — F411 Generalized anxiety disorder: Secondary | ICD-10-CM | POA: Insufficient documentation

## 2016-05-07 DIAGNOSIS — Z880 Allergy status to penicillin: Secondary | ICD-10-CM | POA: Insufficient documentation

## 2016-05-07 DIAGNOSIS — Z7901 Long term (current) use of anticoagulants: Secondary | ICD-10-CM | POA: Diagnosis not present

## 2016-05-07 DIAGNOSIS — Z96652 Presence of left artificial knee joint: Secondary | ICD-10-CM | POA: Insufficient documentation

## 2016-05-07 DIAGNOSIS — D72829 Elevated white blood cell count, unspecified: Secondary | ICD-10-CM | POA: Diagnosis present

## 2016-05-07 DIAGNOSIS — F419 Anxiety disorder, unspecified: Secondary | ICD-10-CM | POA: Diagnosis present

## 2016-05-07 DIAGNOSIS — J9611 Chronic respiratory failure with hypoxia: Secondary | ICD-10-CM | POA: Diagnosis not present

## 2016-05-07 DIAGNOSIS — F410 Panic disorder [episodic paroxysmal anxiety] without agoraphobia: Secondary | ICD-10-CM | POA: Diagnosis not present

## 2016-05-07 DIAGNOSIS — I10 Essential (primary) hypertension: Secondary | ICD-10-CM | POA: Diagnosis present

## 2016-05-07 DIAGNOSIS — R079 Chest pain, unspecified: Secondary | ICD-10-CM | POA: Diagnosis not present

## 2016-05-07 DIAGNOSIS — I110 Hypertensive heart disease with heart failure: Secondary | ICD-10-CM | POA: Diagnosis not present

## 2016-05-07 DIAGNOSIS — R0789 Other chest pain: Secondary | ICD-10-CM | POA: Diagnosis not present

## 2016-05-07 DIAGNOSIS — E785 Hyperlipidemia, unspecified: Secondary | ICD-10-CM | POA: Diagnosis not present

## 2016-05-07 DIAGNOSIS — Z8249 Family history of ischemic heart disease and other diseases of the circulatory system: Secondary | ICD-10-CM | POA: Insufficient documentation

## 2016-05-07 DIAGNOSIS — K219 Gastro-esophageal reflux disease without esophagitis: Secondary | ICD-10-CM | POA: Diagnosis not present

## 2016-05-07 DIAGNOSIS — J449 Chronic obstructive pulmonary disease, unspecified: Secondary | ICD-10-CM | POA: Insufficient documentation

## 2016-05-07 DIAGNOSIS — E039 Hypothyroidism, unspecified: Secondary | ICD-10-CM | POA: Diagnosis not present

## 2016-05-07 DIAGNOSIS — G8929 Other chronic pain: Secondary | ICD-10-CM | POA: Diagnosis not present

## 2016-05-07 DIAGNOSIS — I509 Heart failure, unspecified: Secondary | ICD-10-CM | POA: Diagnosis not present

## 2016-05-07 DIAGNOSIS — Z86711 Personal history of pulmonary embolism: Secondary | ICD-10-CM | POA: Diagnosis not present

## 2016-05-07 DIAGNOSIS — E1140 Type 2 diabetes mellitus with diabetic neuropathy, unspecified: Secondary | ICD-10-CM | POA: Insufficient documentation

## 2016-05-07 DIAGNOSIS — I4891 Unspecified atrial fibrillation: Secondary | ICD-10-CM | POA: Insufficient documentation

## 2016-05-07 DIAGNOSIS — Z7984 Long term (current) use of oral hypoglycemic drugs: Secondary | ICD-10-CM | POA: Insufficient documentation

## 2016-05-07 HISTORY — DX: Dependence on supplemental oxygen: Z99.81

## 2016-05-07 HISTORY — DX: Pure hypercholesterolemia, unspecified: E78.00

## 2016-05-07 HISTORY — DX: Other chronic pain: G89.29

## 2016-05-07 HISTORY — DX: Low back pain: M54.5

## 2016-05-07 HISTORY — DX: Low back pain, unspecified: M54.50

## 2016-05-07 HISTORY — DX: Emphysema, unspecified: J43.9

## 2016-05-07 HISTORY — DX: Type 2 diabetes mellitus without complications: E11.9

## 2016-05-07 LAB — CBC
HCT: 39.1 % (ref 36.0–46.0)
HEMOGLOBIN: 12 g/dL (ref 12.0–15.0)
MCH: 26.5 pg (ref 26.0–34.0)
MCHC: 30.7 g/dL (ref 30.0–36.0)
MCV: 86.5 fL (ref 78.0–100.0)
Platelets: 244 10*3/uL (ref 150–400)
RBC: 4.52 MIL/uL (ref 3.87–5.11)
RDW: 16.8 % — AB (ref 11.5–15.5)
WBC: 12 10*3/uL — ABNORMAL HIGH (ref 4.0–10.5)

## 2016-05-07 LAB — BASIC METABOLIC PANEL
Anion gap: 7 (ref 5–15)
BUN: 20 mg/dL (ref 6–20)
CALCIUM: 10.4 mg/dL — AB (ref 8.9–10.3)
CO2: 26 mmol/L (ref 22–32)
CREATININE: 0.9 mg/dL (ref 0.44–1.00)
Chloride: 102 mmol/L (ref 101–111)
GFR calc Af Amer: >60 mL/min (ref >60)
GLUCOSE: 123 mg/dL — AB (ref 65–99)
Potassium: 3.9 mmol/L (ref 3.5–5.1)
Sodium: 135 mmol/L (ref 135–145)

## 2016-05-07 LAB — HEPATIC FUNCTION PANEL
ALT: 13 U/L — ABNORMAL LOW (ref 14–54)
AST: 25 U/L (ref 15–41)
Albumin: 4 g/dL (ref 3.5–5.0)
Alkaline Phosphatase: 48 U/L (ref 38–126)
BILIRUBIN TOTAL: 0.2 mg/dL — AB (ref 0.3–1.2)
Total Protein: 6.8 g/dL (ref 6.5–8.1)

## 2016-05-07 LAB — URINALYSIS, ROUTINE W REFLEX MICROSCOPIC
BILIRUBIN URINE: NEGATIVE
Glucose, UA: NEGATIVE mg/dL
HGB URINE DIPSTICK: NEGATIVE
Ketones, ur: NEGATIVE mg/dL
Leukocytes, UA: NEGATIVE
Nitrite: NEGATIVE
PROTEIN: NEGATIVE mg/dL
Specific Gravity, Urine: 1.014 (ref 1.005–1.030)
pH: 8 (ref 5.0–8.0)

## 2016-05-07 LAB — GLUCOSE, CAPILLARY: Glucose-Capillary: 171 mg/dL — ABNORMAL HIGH (ref 65–99)

## 2016-05-07 LAB — LIPID PANEL
CHOL/HDL RATIO: 2.6 ratio
Cholesterol: 127 mg/dL (ref 0–200)
HDL: 49 mg/dL (ref >40)
LDL CALC: 58 mg/dL (ref 0–99)
TRIGLYCERIDES: 101 mg/dL (ref <150)
VLDL: 20 mg/dL (ref 0–40)

## 2016-05-07 LAB — PROTIME-INR
INR: 2.66
Prothrombin Time: 28.9 seconds — ABNORMAL HIGH (ref 11.4–15.2)

## 2016-05-07 LAB — I-STAT TROPONIN, ED: Troponin i, poc: 0.01 ng/mL (ref 0.00–0.08)

## 2016-05-07 LAB — TROPONIN I

## 2016-05-07 LAB — BRAIN NATRIURETIC PEPTIDE: B NATRIURETIC PEPTIDE 5: 234.9 pg/mL — AB (ref 0.0–100.0)

## 2016-05-07 MED ORDER — SIMVASTATIN 20 MG PO TABS: 20.0000 mg | ORAL_TABLET | Freq: Every day | ORAL | Status: DC

## 2016-05-07 MED ORDER — ALPRAZOLAM 0.25 MG PO TABS
0.5000 mg | ORAL_TABLET | Freq: Four times a day (QID) | ORAL | Status: DC | PRN
  Administered 2016-05-07 – 2016-05-08 (×2): 1 mg via ORAL
  Filled 2016-05-07 (×2): qty 4

## 2016-05-07 MED ORDER — WARFARIN SODIUM 2.5 MG PO TABS
2.5000 mg | ORAL_TABLET | Freq: Once | ORAL | Status: DC
  Filled 2016-05-07: qty 1

## 2016-05-07 MED ORDER — FENOFIBRATE 160 MG PO TABS
160.0000 mg | ORAL_TABLET | Freq: Every day | ORAL | Status: DC
Start: 2016-05-08 — End: 2016-05-08
  Administered 2016-05-08: 160 mg via ORAL
  Filled 2016-05-07: qty 1

## 2016-05-07 MED ORDER — INSULIN ASPART 100 UNIT/ML ~~LOC~~ SOLN: 0.0000 [IU] | Freq: Every day | SUBCUTANEOUS | Status: DC

## 2016-05-07 MED ORDER — ADULT MULTIVITAMIN W/MINERALS CH
1.0000 | ORAL_TABLET | Freq: Every day | ORAL | Status: DC
  Administered 2016-05-08: 1 via ORAL
  Filled 2016-05-07: qty 1

## 2016-05-07 MED ORDER — LISINOPRIL 10 MG PO TABS
10.0000 mg | ORAL_TABLET | Freq: Every day | ORAL | Status: DC
  Administered 2016-05-08: 10 mg via ORAL
  Filled 2016-05-07: qty 1

## 2016-05-07 MED ORDER — ALBUTEROL SULFATE (2.5 MG/3ML) 0.083% IN NEBU: 2.5000 mg | INHALATION_SOLUTION | Freq: Four times a day (QID) | RESPIRATORY_TRACT | Status: DC | PRN

## 2016-05-07 MED ORDER — DILTIAZEM HCL ER COATED BEADS 120 MG PO CP24
120.0000 mg | ORAL_CAPSULE | Freq: Every day | ORAL | Status: DC
  Administered 2016-05-08: 120 mg via ORAL
  Filled 2016-05-07: qty 1

## 2016-05-07 MED ORDER — LEVOTHYROXINE SODIUM 50 MCG PO TABS
50.0000 ug | ORAL_TABLET | Freq: Every day | ORAL | Status: DC
  Administered 2016-05-08: 50 ug via ORAL
  Filled 2016-05-07: qty 1

## 2016-05-07 MED ORDER — DOCUSATE SODIUM 100 MG PO CAPS
100.0000 mg | ORAL_CAPSULE | Freq: Two times a day (BID) | ORAL | Status: DC | PRN
Start: 2016-05-07 — End: 2016-05-08

## 2016-05-07 MED ORDER — VITAMIN D 1000 UNITS PO TABS
2000.0000 [IU] | ORAL_TABLET | Freq: Every day | ORAL | Status: DC
  Administered 2016-05-08: 2000 [IU] via ORAL
  Filled 2016-05-07: qty 2

## 2016-05-07 MED ORDER — WARFARIN - PHARMACIST DOSING INPATIENT: Freq: Every day | Status: DC

## 2016-05-07 MED ORDER — INSULIN ASPART 100 UNIT/ML ~~LOC~~ SOLN
0.0000 [IU] | Freq: Three times a day (TID) | SUBCUTANEOUS | Status: DC
  Administered 2016-05-08: 2 [IU] via SUBCUTANEOUS
  Administered 2016-05-08: 100 [IU] via SUBCUTANEOUS

## 2016-05-07 MED ORDER — ACETAMINOPHEN 325 MG PO TABS: 650.0000 mg | ORAL_TABLET | ORAL | Status: DC | PRN

## 2016-05-07 MED ORDER — WARFARIN SODIUM 2.5 MG PO TABS
2.5000 mg | ORAL_TABLET | Freq: Once | ORAL | Status: AC
Start: 2016-05-07 — End: 2016-05-07
  Administered 2016-05-07: 2.5 mg via ORAL
  Filled 2016-05-07: qty 1

## 2016-05-07 MED ORDER — ONDANSETRON HCL 4 MG/2ML IJ SOLN: 4.0000 mg | Freq: Four times a day (QID) | INTRAMUSCULAR | Status: DC | PRN

## 2016-05-07 MED ORDER — SIMVASTATIN 20 MG PO TABS
20.0000 mg | ORAL_TABLET | Freq: Every day | ORAL | Status: DC
  Administered 2016-05-07 – 2016-05-08 (×2): 20 mg via ORAL
  Filled 2016-05-07 (×2): qty 1

## 2016-05-07 MED ORDER — ACETAMINOPHEN 325 MG PO TABS
325.0000 mg | ORAL_TABLET | Freq: Four times a day (QID) | ORAL | Status: DC | PRN
  Administered 2016-05-08: 650 mg via ORAL
  Filled 2016-05-07: qty 2

## 2016-05-07 MED ORDER — ALPRAZOLAM 0.25 MG PO TABS
2.0000 mg | ORAL_TABLET | Freq: Once | ORAL | Status: AC
  Administered 2016-05-07: 2 mg via ORAL
  Filled 2016-05-07: qty 8

## 2016-05-07 NOTE — H&P (Signed)
History and Physical        Hospital Admission Note Date: 05/07/2016  Patient name: Sarah Mcguire Medical record number: [id number] Date of birth: April 07, 1941 Age: 75 y.o. Gender: female  PCP: Annye Asa, MD   Referring physician Dr Long  Patient coming from: home   Primary cardiologist, Dr. Johnsie Cancel   Chief Complaint:  Chest pain  HPI: Patient is a 75 year old female with anxiety, anemia, atrial fibrillation on Coumadin, GERD, hypertension, hyperlipidemia, COPD, prior PE presented to ED with chest pain today. Patient and her husband described the chest pain as midsternal, started today around 11 AM when patient was sitting on the couch. The patient is a difficult historian and is not very forthcoming with history. Chest pain lasted less than an hour, with no radiation, no associated symptoms of nausea, vomiting, dizziness, diaphoresis or shortness of breath. Patient called EMS however chest pain had resolved prior to EMS arrival. Patient reports compliant with Coumadin. She does have history of GERD however he describes the chest pain was different. She has history of significant anxiety and had a panic attack when patient was being brought here to the ED and required a 5 mg of Versed to control her anxiety. At the time of my examination, patient states that the chest pain has resolved.  patient reports that she had stress test years ago and was normal  ED work-up/course:  Chest pain resolved, patient was hemodynamically stable. Afebrile, heart rate 83, respiration 24, BP 121/88, O2 sats 100%. CBC, BMET unremarkable except a previous cyst wall 0.0. INR 2.6 troponins negative, BNP 234.9   Review of Systems: Positives marked in 'bold' Constitutional: Denies fever, chills, diaphoresis, poor appetite and fatigue.  HEENT: Denies photophobia, eye pain, redness, hearing loss, ear  pain, congestion, sore throat, rhinorrhea, sneezing, mouth sores, trouble swallowing, neck pain, neck stiffness and tinnitus.   Respiratory: Denies SOB, DOE, cough, and wheezing.   Cardiovascular please see history of present illness Gastrointestinal: Denies nausea, vomiting, abdominal pain, diarrhea, constipation, blood in stool and abdominal distention.  Genitourinary: Denies dysuria, urgency, frequency, hematuria, flank pain and difficulty urinating.  Musculoskeletal: Denies myalgias, back pain, joint swelling, arthralgias and gait problem.  Skin: Denies pallor, rash and wound.  Neurological: Denies dizziness, seizures, syncope, weakness, light-headedness, numbness and headaches.  Hematological: Denies adenopathy. Easy bruising, personal or family bleeding history  Psychiatric/Behavioral: Denies suicidal ideation, mood changes, confusion, nervousness, sleep disturbance and agitation  Past Medical History: Past Medical History:  Diagnosis Date  . Anemia   . Anxiety    SEVERE  . Arthritis   . Atrial fibrillation with RVR ([place]) 11/28/2012  . Diabetes mellitus    borderline no medications  . Diverticulitis   . DVT (deep venous thrombosis) ([place])   . GERD (gastroesophageal reflux disease)   . Hyperglycemia   . Hyperlipidemia   . Hypertension   . Leukocytosis   . Neuropathy ([place])   . PE (pulmonary embolism)    Bilateral PE, stringy saddle embolus with predominant proximal lower lobe pulmonary artery involvement 06/26/12    Past Surgical History:  Procedure Laterality Date  . TONSILLECTOMY    . TOTAL KNEE ARTHROPLASTY Left 11/24/2012  Procedure: TOTAL KNEE ARTHROPLASTY;  Surgeon: Kerin Salen, MD;  Location: [facility];  Service: Orthopedics;  Laterality: Left;  . TYMPANOSTOMY TUBE PLACEMENT     R ear    Medications: Prior to Admission medications   Medication Sig Start Date End Date Taking? Authorizing Provider  acetaminophen (TYLENOL) 325 MG tablet Take 325-650 mg by mouth every  6 (six) hours as needed (for headache).    Yes Historical Provider, MD  albuterol (PROVENTIL HFA;VENTOLIN HFA) 108 (90 Base) MCG/ACT inhaler Inhale 2 puffs into the lungs every 6 (six) hours as needed for wheezing or shortness of breath. 04/17/16  Yes Tammy S Parrett, NP  alprazolam (XANAX) 2 MG tablet TAKE 1/2-1 TABLET BY MOUTH 3 TIMES A DAY AS NEEDED FOR SLEEP OR ANXIETY Patient taking differently: Take 1 mg by mouth 6 (six) times daily.  03/16/16  Yes Midge Minium, MD  Cholecalciferol (VITAMIN D) 2000 UNITS tablet Take 2,000 Units by mouth daily.   Yes Historical Provider, MD  clobetasol cream (TEMOVATE) 0.05 % APPLY EXTERNALLY AT BEDTIME FOR ONE MONTH THEN AS NEEDED FOR ITCHING 02/24/16  Yes Timothy P Fontaine, MD  diltiazem (CARDIZEM CD) 120 MG 24 hr capsule TAKE 1 CAPSULE (120 MG TOTAL) BY MOUTH DAILY. 01/23/16  Yes Josue Hector, MD  docusate sodium (COLACE) 100 MG capsule Take 100 mg by mouth 2 (two) times daily as needed for mild constipation. Reported on 03/21/2016   Yes Historical Provider, MD  esomeprazole (NEXIUM) 40 MG capsule Take 1 capsule (40 mg total) by mouth daily as needed. For acid reflux. Patient taking differently: Take 40 mg by mouth daily before breakfast. For acid reflux. 05/24/15  Yes Midge Minium, MD  fenofibrate 160 MG tablet Take 1 tablet (160 mg total) by mouth daily. 03/20/16  Yes Midge Minium, MD  folic acid (FOLVITE) 1 MG tablet Take 1 tablet (1 mg total) by mouth daily. 05/31/14  Yes Midge Minium, MD  furosemide (LASIX) 40 MG tablet TAKE 1 TABLET BY MOUTH EVERY DAY 04/23/16  Yes Midge Minium, MD  HYDROcodone-acetaminophen (NORCO/VICODIN) 5-325 MG tablet Take 1-2 tablets by mouth every 4 (four) hours as needed. 04/09/16  Yes Midge Minium, MD  levothyroxine (SYNTHROID, LEVOTHROID) 50 MCG tablet Take 1 tablet (50 mcg total) by mouth daily. Patient taking differently: Take 50 mcg by mouth daily before breakfast.  12/19/15  Yes Midge Minium, MD  lidocaine (LIDODERM) 5 % Place 1 patch onto the skin daily as needed (for pain.). Remove & Discard patch within 12 hours or as directed by MD   Yes Historical Provider, MD  lidocaine (XYLOCAINE) 5 % ointment APPLY 1 APPLICATION TOPICALLY 3 (THREE) TIMES DAILY AS NEEDED. FOR ITCHING DIRECTLY TO THE SPOT 02/24/16  Yes Anastasio Auerbach, MD  lisinopril (PRINIVIL,ZESTRIL) 10 MG tablet Take 1 tablet (10 mg total) by mouth daily. 11/30/15  Yes Midge Minium, MD  metFORMIN (GLUCOPHAGE-XR) 500 MG 24 hr tablet TAKE 2 TABLETS BY MOUTH IN THE MORNING WITH FOOD AND 1 TABLET IN THE EVENING WITH FOOD. 01/23/16  Yes Midge Minium, MD  Multiple Vitamin (MULTIVITAMIN) tablet Take 1 tablet by mouth daily.     Yes Historical Provider, MD  nicotine (NICODERM CQ - DOSED IN MG/24 HR) 7 mg/24hr patch Place 2 patches (14 mg total) onto the skin daily. 03/20/16  Yes Mir Marry Guan, MD  nystatin (MYCOSTATIN/NYSTOP) 100000 UNIT/GM POWD Apply to affected area twice daily Patient taking differently: Apply 1  g topically 2 (two) times daily as needed (for irritation of skin folds). Apply to affected area twice daily 10/10/15  Yes Anastasio Auerbach, MD  simvastatin (ZOCOR) 20 MG tablet TAKE 1 TABLET (20 MG TOTAL) BY MOUTH EVERY EVENING. 11/30/15  Yes Midge Minium, MD  umeclidinium-vilanterol (ANORO ELLIPTA) 62.5-25 MCG/INH AEPB Inhale 1 puff into the lungs daily. 04/02/16  Yes Rigoberto Noel, MD  warfarin (COUMADIN) 5 MG tablet TAKE 1 TABLET ON TUES, THURS, SATURDAY AND SUNDAY, TAKE 1/2 TAB ON REMAINING DAYS Patient taking differently: TAKE 5 mg TUES, THURS, SAT, SUN. TAKE 2.5 MG MON, FRI. NO COUMADIN ON WED. 11/24/15  Yes Midge Minium, MD  glucose blood (ONE TOUCH ULTRA TEST) test strip TEST TWICE A DAY AS DIRECTED 12/19/15   Midge Minium, MD  umeclidinium-vilanterol (ANORO ELLIPTA) 62.5-25 MCG/INH AEPB Inhale 1 puff into the lungs daily. Patient not taking: Reported on 05/07/2016 04/17/16    Melvenia Needles, NP    Allergies:   Allergies  Allergen Reactions  . Morphine And Related     Usually drops her blood pressure  . Amoxicillin Other (See Comments)    Unknown Has patient had a PCN reaction causing immediate rash, facial/tongue/throat swelling, SOB or lightheadedness with hypotension:unsure Has patient had a PCN reaction causing severe rash involving mucus membranes or skin necrosis:unsure Has patient had a PCN reaction that required hospitalization:unsure Has patient had a PCN reaction occurring within the last 10 years:unsure If all of the above answers are "NO", then may proceed with Cephalosporin use.     Marland Kitchen Amoxicillin-Pot Clavulanate Other (See Comments)    Has patient had a PCN reaction causing immediate rash, facial/tongue/throat swelling, SOB or lightheadedness with hypotension:unsure Has patient had a PCN reaction causing severe rash involving mucus membranes or skin necrosis:unsure Has patient had a PCN reaction that required hospitalization:unsure Has patient had a PCN reaction occurring within the last 10 years:unsure If all of the above answers are "NO", then may proceed with Cephalosporin use.    . Citalopram Hydrobromide Other (See Comments)    Itching   . Clonazepam Itching  . Cymbalta [Duloxetine Hcl] Other (See Comments)    Nausea, headache and diarrhea   . Macrobid [Nitrofurantoin Macrocrystal] Other (See Comments)    Nausea, vomitting  . Penicillins Other (See Comments)    Unknown Has patient had a PCN reaction causing immediate rash, facial/tongue/throat swelling, SOB or lightheadedness with hypotension:unsure Has patient had a PCN reaction causing severe rash involving mucus membranes or skin necrosis:unsure Has patient had a PCN reaction that required hospitalization:unsure Has patient had a PCN reaction occurring within the last 10 years:unsure If all of the above answers are "NO", then may proceed with Cephalosporin use.      .  Pyridium [Phenazopyridine Hcl] Other (See Comments)    headache  . Zonegran Other (See Comments)    Unknown   . Zonisamide Other (See Comments)    Unknown   . Sertraline Hcl Anxiety  . Wellbutrin [Bupropion] Itching and Rash    Itching, rash, hyper     Social History:  reports that she quit smoking about 7 weeks ago. Her smoking use included Cigarettes. She has a 25.00 pack-year smoking history. She has never used smokeless tobacco. She reports that she does not drink alcohol or use drugs.  Family History: Family History  Problem Relation Age of Onset  . Diabetes Mother   . Hypertension Mother   . Heart disease Father  MI age 30's   . Diabetes Brother   . Hypertension Brother     Physical Exam: Blood pressure 121/88, pulse 83, resp. rate 24, height 5\' 3"  (1.6 m), weight 69.9 kg (154 lb), SpO2 100 %. General: Alert, awake, oriented x3, in no acute distress, anxious . HEENT: normocephalic, atraumatic, anicteric sclera, pink conjunctiva, pupils equal and reactive to light and accomodation, oropharynx clear Neck: supple, no masses or lymphadenopathy, no goiter, no bruits  Heart: Regular rate and rhythm, without murmurs, rubs or gallops. Lungs: Clear to auscultation bilaterally, no wheezing, rales or rhonchi. Abdomen: Soft, nontender, nondistended, positive bowel sounds, no masses. Extremities: No clubbing, cyanosis or edema with positive pedal pulses. Neuro: Grossly intact, no focal neurological deficits, strength 5/5 upper and lower extremities bilaterally Psych: alert and oriented x 3, normal mood and affect Skin: no rashes or lesions, warm and dry   LABS on Admission:  Basic Metabolic Panel:  Recent Labs Lab 05/07/16 1447  NA 135  K 3.9  CL 102  CO2 26  GLUCOSE 123*  BUN 20  CREATININE 0.90  CALCIUM 10.4*   Liver Function Tests:  Recent Labs Lab 05/07/16 1447  AST 25  ALT 13*  ALKPHOS 48  BILITOT 0.2*  PROT 6.8  ALBUMIN 4.0   No results for  input(s): LIPASE, AMYLASE in the last 168 hours. No results for input(s): AMMONIA in the last 168 hours. CBC:  Recent Labs Lab 05/07/16 1447  WBC 12.0*  HGB 12.0  HCT 39.1  MCV 86.5  PLT 244   Cardiac Enzymes: No results for input(s): CKTOTAL, CKMB, CKMBINDEX, TROPONINI in the last 168 hours. BNP: Invalid input(s): POCBNP CBG: No results for input(s): GLUCAP in the last 168 hours.  Radiological Exams on Admission:  No results found.  *I have personally reviewed the images above*  EKG: Independently reviewed. Rate 84, normal sinus rhythm, no acute ST-T wave changes suggestive of ischemia, LVH   Assessment/Plan Principal Problem:  Atypical Chest pain: Respect as includes diabetes, hypertension, hyperlipidemia, patient however also has significant anxiety with panic attack, GERD.  - Currently troponins negative, no acute ST-T wave changes suggestive of ischemia on EKG - Obtain serial cardiac enzymes, lipid panel - Continue Cardizem, lisinopril, statin - NPO after midnight, cardiology consult in a.m. for further workup - Place on PPI  Active Problems:   Diabetes mellitus with neuropathy (HCC) - Hold metformin, place on sliding scale insulin - Obtain hemoglobin A1c    Leukocytosis - Chest x-ray not available, will check UA and culture - Follow CBC, if spikes any fevers, obtain obtain blood cultures    Essential hypertension - Currently stable, continue Cardizem, lisinopril    GERD (gastroesophageal reflux disease) - Place on PPI  Hyperlipidemia - Obtain lipid panel, continue statin    AnxietyWith panic attacks - Patient is having significant anxiety attacks during the examination, will place on Xanax    COPD (chronic obstructive pulmonary disease) (HCC) - Currently stable, no wheezing, continue duonebs prn  Atrial fibrillation - Currently stable, continue Cardizem, Coumadin per pharmacy - CHADS VASC >4  DVT prophylaxis: COUMADIN  CODE STATUS: full code     Consults called:   Family Communication: Admission, patients condition and plan of care including tests being ordered have been discussed with the patient and husband who indicates understanding and agree with the plan and Code Status  Admission status:   Disposition plan: Further plan will depend as patient's clinical course evolves and further radiologic and laboratory data become available.  Likely home   Time Spent on Admission: 67mins   Roza Creamer M.D. [facility] 05/07/2016, 6:21 PM Pager: [phone]  If 7PM-7AM, please contact night-coverage [email or web address] Password [email or web address]

## 2016-05-07 NOTE — ED Triage Notes (Signed)
Pt from home via [facility] for chest pain since yesterday.  EMS sates initially pt was calm, but once she was in the truck, pt became paranoid and tried to jump out of the back of the truck.  5 mg midazolam given en-route to calm pt.  Pt yelling upon arrival.  EKG per EMS was NSR.  bp 141/84, O2 sats 90% RA, 94% 2L.

## 2016-05-07 NOTE — ED Notes (Signed)
Pt still deliberating whether or not to stay. Will give xanax per pt norm to calm pt to better make decision.

## 2016-05-07 NOTE — ED Provider Notes (Signed)
Emergency Department Provider Note   I have reviewed the triage vital signs and the nursing notes.   HISTORY  Chief Complaint Chest Pain and Panic Attack   HPI Sarah Mcguire is a 75 y.o. female with PMH of anxiety, anemia, a-fib on coumadin, GERD, HLD, HTN, COPD on 2L O2 at home, and prior PE who presents to the emergency department for evaluation of intermittent chest pain. Patient describes central chest pain yesterday evening that lasted for approximately 1 hour and resolved spontaneously. No exacerbating or alleviating factors. No radiation of pain. May have been associated with eating but patient cannot be totally sure of this. The pain resolves she was able to sleep. Pain returned today around lunchtime lasted for 45 minutes to an hour. EMS was called at which time her pain resolved. She denies any associated fever, productive cough, dyspnea. She's been compliant with her Coumadin.   Patient states that she has a phobia of being strapped down and is also claustrophobic. When she was loaded into the ambulance she began having an anxiety attack. The patient became agitated per EMS and she required 5 mg of Versed to help control her en route. Patient denies any chest pain since EMS arrived on scene. At this time she is feeling well but reports being drowsy because of medication.   Past Medical History:  Diagnosis Date  . Anemia   . Anxiety    SEVERE  . Arthritis   . Atrial fibrillation with RVR ([place]) 11/28/2012  . Diabetes mellitus    borderline no medications  . Diverticulitis   . DVT (deep venous thrombosis) ([place])   . GERD (gastroesophageal reflux disease)   . Hyperglycemia   . Hyperlipidemia   . Hypertension   . Leukocytosis   . Neuropathy ([place])   . PE (pulmonary embolism)    Bilateral PE, stringy saddle embolus with predominant proximal lower lobe pulmonary artery involvement 06/26/12    Patient Active Problem List   Diagnosis Date Noted  . COPD (chronic  obstructive pulmonary disease) ([place]) 04/02/2016  . Chronic hypoxemic respiratory failure ([place]) 03/19/2016  . Hypothyroidism 09/14/2015  . Borderline low O2 saturation 05/24/2015  . Premature atrial contraction 10/21/2014  . Abnormal EKG 10/21/2014  . History of pulmonary embolism 10/21/2014  . Atrial flutter ([place]) 08/30/2014  . Right hip pain 08/01/2014  . Chest pain, unspecified 04/22/2014  . Vaginitis and vulvovaginitis 03/22/2014  . UTI symptoms 03/22/2014  . Knee pain 01/11/2014  . External hemorrhoid, bleeding 11/02/2013  . Encounter for therapeutic drug monitoring 10/15/2013  . Cataract of both eyes 08/03/2013  . Right groin pain 03/31/2013  . Insomnia 12/09/2012  . Atrial fibrillation with RVR ([place]) 11/28/2012  . Osteoarthritis of left knee 11/26/2012  . Leg pain 11/03/2012  . Left knee pain 10/31/2012  . Geneen Dieter term (current) use of anticoagulants 07/16/2012  . PE (pulmonary embolism) 06/30/2012  . GERD (gastroesophageal reflux disease) 06/26/2012  . Anxiety 06/26/2012  . DJD (degenerative joint disease) 06/26/2012  . Palpitations 06/25/2012  . Chest pain 02/13/2012  . Fatigue 02/01/2012  . Panic attack 11/13/2011  . General medical examination 07/21/2011  . Osteoporosis 07/09/2011  . Right sided abdominal pain 03/26/2011  . Plantar fasciitis 03/13/2011  . Diverticulitis 12/13/2010  . DYSURIA 10/24/2010  . RHINITIS 10/02/2010  . CARDIOMYOPATHY, DILATED 08/01/2010  . OBESITY 05/05/2010  . HIP PAIN 02/28/2010  . PLANTAR FASCIITIS, RIGHT 02/24/2010  . EDEMA- LOCALIZED 02/20/2010  . PORTAL VEIN THROMBOSIS 01/23/2010  . HEART FAILURE,  CONGESTIVE UNSPEC 01/09/2010  . CANDIDIASIS OF MOUTH 01/02/2010  . ANEMIA 01/02/2010  . Leukocytosis 01/02/2010  . THROMBOCYTOSIS 01/02/2010  . GERD 01/02/2010  . VAGINITIS, ATROPHIC 12/05/2009  . BACK PAIN, CHRONIC 11/08/2009  . Diabetes mellitus with neuropathy ([place]) 11/07/2009  . SMOKER 08/31/2009  . UTI 06/13/2009  . VAGINITIS  06/06/2009  . EUSTACHIAN TUBE DYSFUNCTION 03/28/2009  . HEART MURMUR, SYSTOLIC 0000000  . SINUSITIS- ACUTE-NOS 11/01/2008  . DIVERTICULITIS OF COLON 09/06/2008  . Hyperlipidemia associated with type 2 diabetes mellitus ([place]) 07/30/2008  . Anxiety and depression 07/30/2008  . Essential hypertension 07/30/2008    Past Surgical History:  Procedure Laterality Date  . TONSILLECTOMY    . TOTAL KNEE ARTHROPLASTY Left 11/24/2012   Procedure: TOTAL KNEE ARTHROPLASTY;  Surgeon: Kerin Salen, MD;  Location: [facility];  Service: Orthopedics;  Laterality: Left;  . TYMPANOSTOMY TUBE PLACEMENT     R ear      Allergies Morphine and related; Amoxicillin; Amoxicillin-pot clavulanate; Citalopram hydrobromide; Clonazepam; Cymbalta [duloxetine hcl]; Macrobid [nitrofurantoin macrocrystal]; Penicillins; Pyridium [phenazopyridine hcl]; Zonegran; Zonisamide; Sertraline hcl; and Wellbutrin [bupropion]  Family History  Problem Relation Age of Onset  . Diabetes Mother   . Hypertension Mother   . Heart disease Father     MI age 44's   . Diabetes Brother   . Hypertension Brother     Social History Social History  Substance Use Topics  . Smoking status: Former Smoker    Packs/day: 0.50    Years: 50.00    Types: Cigarettes    Quit date: 03/18/2016  . Smokeless tobacco: Never Used  . Alcohol use No    Review of Systems  Constitutional: No fever/chills Eyes: No visual changes. ENT: No sore throat. Cardiovascular: Positive chest pain. Respiratory: Denies shortness of breath. Gastrointestinal: No abdominal pain.  No nausea, no vomiting.  No diarrhea.  No constipation. Genitourinary: Negative for dysuria. Musculoskeletal: Negative for back pain. Skin: Negative for rash. Neurological: Negative for headaches, focal weakness or numbness.  10-point ROS otherwise negative.  ____________________________________________   PHYSICAL EXAM:  VITAL SIGNS: RR: 24 SpO2: 97% HR: 58 BP: 126/96 Temp:  97.7 F  Constitutional: Drowsy with slight confusion.  Eyes: Conjunctivae are normal.  Head: Atraumatic. Nose: No congestion/rhinnorhea. Mouth/Throat: Mucous membranes are moist.  Oropharynx non-erythematous. Neck: No stridor.   Cardiovascular: Normal rate, regular rhythm. Good peripheral circulation. Grossly normal heart sounds.   Respiratory: Normal respiratory effort.  No retractions. Lungs CTAB. Gastrointestinal: Soft and nontender. No distention.  Musculoskeletal: No lower extremity tenderness nor edema. No gross deformities of extremities. Neurologic:  Normal speech and language. No gross focal neurologic deficits are appreciated.  Skin:  Skin is warm, dry and intact. No rash noted.  ____________________________________________   LABS (all labs ordered are listed, but only abnormal results are displayed)  Labs Reviewed  BASIC METABOLIC PANEL - Abnormal; Notable for the following:       Result Value   Glucose, Bld 123 (*)    Calcium 10.4 (*)    All other components within normal limits  CBC - Abnormal; Notable for the following:    WBC 12.0 (*)    RDW 16.8 (*)    All other components within normal limits  HEPATIC FUNCTION PANEL - Abnormal; Notable for the following:    ALT 13 (*)    Total Bilirubin 0.2 (*)    Bilirubin, Direct <0.1 (*)    All other components within normal limits  PROTIME-INR - Abnormal; Notable for the following:  Prothrombin Time 28.9 (*)    All other components within normal limits  BRAIN NATRIURETIC PEPTIDE - Abnormal; Notable for the following:    B Natriuretic Peptide 234.9 (*)    All other components within normal limits  BASIC METABOLIC PANEL - Abnormal; Notable for the following:    Glucose, Bld 129 (*)    All other components within normal limits  CBC - Abnormal; Notable for the following:    Hemoglobin 11.2 (*)    RDW 16.9 (*)    All other components within normal limits  GLUCOSE, CAPILLARY - Abnormal; Notable for the following:     Glucose-Capillary 171 (*)    All other components within normal limits  PROTIME-INR - Abnormal; Notable for the following:    Prothrombin Time 29.4 (*)    All other components within normal limits  GLUCOSE, CAPILLARY - Abnormal; Notable for the following:    Glucose-Capillary 123 (*)    All other components within normal limits  URINE CULTURE  URINALYSIS, ROUTINE W REFLEX MICROSCOPIC (NOT AT [facility])  TROPONIN I  LIPID PANEL  TROPONIN I  TROPONIN I  HEMOGLOBIN A1C  I-STAT TROPOININ, ED   ____________________________________________  EKG   EKG Interpretation  Date/Time:  Monday May 07 2016 14:48:20 EDT Ventricular Rate:  84 PR Interval:    QRS Duration: 90 QT Interval:  383 QTC Calculation: 453 R Axis:   50 Text Interpretation:  Sinus rhythm Multiple ventricular premature complexes Probable LVH with secondary repol abnrm No STEMI. Compared to prior tracing.  Confirmed by Suhayb Anzalone MD, Ellis Koffler [phone]) on 05/07/2016 3:19:53 PM       ____________________________________________  RADIOLOGY  Dg Chest Port 1 View  Result Date: 05/07/2016 CLINICAL DATA:  Chest pain for 2 days, history hypertension, hyperlipidemia, diabetes mellitus, pulmonary embolism, former smoker, GERD, dilated cardiomyopathy, CHF EXAM: PORTABLE CHEST 1 VIEW COMPARISON:  Portable exam 1844 hours compared to 03/19/2016 FINDINGS: Enlargement of cardiac silhouette. Atherosclerotic calcification and elongation of thoracic aorta. Mediastinal contours and pulmonary vascularity normal. Lungs appear emphysematous with minimal RIGHT basilar atelectasis. Remaining lungs clear. No pleural effusion or pneumothorax. Bones demineralized. IMPRESSION: Enlargement of cardiac silhouette. Question emphysematous changes with minimal RIGHT basilar atelectasis. Aortic atherosclerosis. Electronically Signed   By: Lavonia Dana M.D.   On: 05/07/2016 19:15   Dg Abd Portable 1 View  Result Date: 05/07/2016 CLINICAL DATA:  Back attacks.  Altered  mental status today. EXAM: PORTABLE ABDOMEN - 1 VIEW COMPARISON:  CT abdomen and pelvis 01/20/2010. FINDINGS: The bowel gas pattern is normal. No radio-opaque calculi or other significant radiographic abnormality are seen. Aortic atherosclerosis is noted. IMPRESSION: No acute abnormality. Atherosclerosis. Electronically Signed   By: Inge Rise M.D.   On: 05/07/2016 15:11    ____________________________________________   PROCEDURES  Procedure(s) performed:   Procedures  None ____________________________________________   INITIAL IMPRESSION / ASSESSMENT AND PLAN / ED COURSE  Pertinent labs & imaging results that were available during my care of the patient were reviewed by me and considered in my medical decision making (see chart for details).  Patient with significant medical comorbidities presents to the emergency department for evaluation of chest pressure. No chest pain right now. Patient given Versed en route with EMS 2/2 panic attack. Plan for ACS evaluation. Patient on home oxygen 2 L. She is somewhat drowsy secondary to the Versed. Low suspicion for pulmonary embolus and the patient is on Coumadin. Will check INR.   04:54 PM Updated the patient regarding her lab work including normal  troponin. She is very anxious and asking for some of her Xanax which I have given. She reports that she wants to go home multiple times. I discussed my concerns regarding this especially considering her age and risk factors for ACS. Patient states that she cannot afford an observational admission would like to go home. We are waiting on BNP lab work to result. Will discuss further with patient.   Patient agrees to admission. Discussed with hospitalist and will place temporary admission orders.   Reviewed patient labs, EKG, and CXR. Updated patient who is feeling more relaxed after home Xanax.  ____________________________________________  FINAL CLINICAL IMPRESSION(S) / ED DIAGNOSES  Final  diagnoses:  Chest pain, unspecified chest pain type     MEDICATIONS GIVEN DURING THIS VISIT:  Medications  albuterol (PROVENTIL) (2.5 MG/3ML) 0.083% nebulizer solution 2.5 mg (not administered)  fenofibrate tablet 160 mg (160 mg Oral Given 05/08/16 1003)  docusate sodium (COLACE) capsule 100 mg (not administered)  ALPRAZolam (XANAX) tablet 0.5-1 mg (1 mg Oral Given 05/08/16 0522)  levothyroxine (SYNTHROID, LEVOTHROID) tablet 50 mcg (50 mcg Oral Given 05/08/16 0649)  diltiazem (CARDIZEM CD) 24 hr capsule 120 mg (120 mg Oral Given 05/08/16 1003)  lisinopril (PRINIVIL,ZESTRIL) tablet 10 mg (10 mg Oral Given 05/08/16 1002)  cholecalciferol (VITAMIN D) tablet 2,000 Units (2,000 Units Oral Given 05/08/16 1003)  multivitamin with minerals tablet 1 tablet (1 tablet Oral Given 05/08/16 1003)  acetaminophen (TYLENOL) tablet 325-650 mg (not administered)  ondansetron (ZOFRAN) injection 4 mg (not administered)  insulin aspart (novoLOG) injection 0-5 Units (0 Units Subcutaneous Not Given 05/07/16 2225)  insulin aspart (novoLOG) injection 0-9 Units (100 Units Subcutaneous Given 05/08/16 0650)  Warfarin - Pharmacist Dosing Inpatient (not administered)  simvastatin (ZOCOR) tablet 20 mg (20 mg Oral Given 05/07/16 2313)  regadenoson (LEXISCAN) injection SOLN 0.4 mg (not administered)  regadenoson (LEXISCAN) injection SOLN 0.4 mg (not administered)  regadenoson (LEXISCAN) 0.4 MG/5ML injection SOLN (not administered)  ALPRAZolam (XANAX) tablet 2 mg (2 mg Oral Given 05/07/16 1659)  warfarin (COUMADIN) tablet 2.5 mg (2.5 mg Oral Given 05/07/16 1926)  LORazepam (ATIVAN) injection 0.5 mg (0.5 mg Intravenous Given 05/08/16 0127)  technetium tetrofosmin (TC-MYOVIEW) injection 10 millicurie (10 millicuries Intravenous Contrast Given 05/08/16 1050)     NEW OUTPATIENT MEDICATIONS STARTED DURING THIS VISIT:  None   Note:  This document was prepared using Dragon voice recognition software and may include unintentional dictation  errors.  Nanda Quinton, MD Emergency Medicine   Margette Fast, MD 05/08/16 [phone]

## 2016-05-07 NOTE — Progress Notes (Addendum)
ANTICOAGULATION CONSULT NOTE - Initial Consult  Pharmacy Consult for warfarin Indication: atrial fibrillation  Allergies  Allergen Reactions  . Morphine And Related     Usually drops her blood pressure  . Amoxicillin Other (See Comments)    Unknown Has patient had a PCN reaction causing immediate rash, facial/tongue/throat swelling, SOB or lightheadedness with hypotension:unsure Has patient had a PCN reaction causing severe rash involving mucus membranes or skin necrosis:unsure Has patient had a PCN reaction that required hospitalization:unsure Has patient had a PCN reaction occurring within the last 10 years:unsure If all of the above answers are "NO", then may proceed with Cephalosporin use.     Marland Kitchen Amoxicillin-Pot Clavulanate Other (See Comments)    Has patient had a PCN reaction causing immediate rash, facial/tongue/throat swelling, SOB or lightheadedness with hypotension:unsure Has patient had a PCN reaction causing severe rash involving mucus membranes or skin necrosis:unsure Has patient had a PCN reaction that required hospitalization:unsure Has patient had a PCN reaction occurring within the last 10 years:unsure If all of the above answers are "NO", then may proceed with Cephalosporin use.    . Citalopram Hydrobromide Other (See Comments)    Itching   . Clonazepam Itching  . Cymbalta [Duloxetine Hcl] Other (See Comments)    Nausea, headache and diarrhea   . Macrobid [Nitrofurantoin Macrocrystal] Other (See Comments)    Nausea, vomitting  . Penicillins Other (See Comments)    Unknown Has patient had a PCN reaction causing immediate rash, facial/tongue/throat swelling, SOB or lightheadedness with hypotension:unsure Has patient had a PCN reaction causing severe rash involving mucus membranes or skin necrosis:unsure Has patient had a PCN reaction that required hospitalization:unsure Has patient had a PCN reaction occurring within the last 10 years:unsure If all of the  above answers are "NO", then may proceed with Cephalosporin use.      . Pyridium [Phenazopyridine Hcl] Other (See Comments)    headache  . Zonegran Other (See Comments)    Unknown   . Zonisamide Other (See Comments)    Unknown   . Sertraline Hcl Anxiety  . Wellbutrin [Bupropion] Itching and Rash    Itching, rash, hyper     Patient Measurements: Height: 5\' 3"  (160 cm) Weight: 154 lb (69.9 kg) IBW/kg (Calculated) : 52.4  Vital Signs: BP: 121/88 (09/04 1645) Pulse Rate: 83 (09/04 1645)  Labs:  Recent Labs  05/07/16 1447  HGB 12.0  HCT 39.1  PLT 244  LABPROT 28.9*  INR 2.66  CREATININE 0.90    Estimated Creatinine Clearance: 49.9 mL/min (by C-G formula based on SCr of 0.9 mg/dL).   Medical History: Past Medical History:  Diagnosis Date  . Anemia   . Anxiety    SEVERE  . Arthritis   . Atrial fibrillation with RVR ([place]) 11/28/2012  . Diabetes mellitus    borderline no medications  . Diverticulitis   . DVT (deep venous thrombosis) ([place])   . GERD (gastroesophageal reflux disease)   . Hyperglycemia   . Hyperlipidemia   . Hypertension   . Leukocytosis   . Neuropathy ([place])   . PE (pulmonary embolism)    Bilateral PE, stringy saddle embolus with predominant proximal lower lobe pulmonary artery involvement 06/26/12    Assessment:  75 y/o F on 05/07/2016 with chest pain. Pharmacy consulted to restart home warfarin for atrial fibrillation. Hgb 12, plts 244. INR 2.66.   On warfarin 5 mg daily except for 2.5 mg on Monday and Friday and NO warfarin on Wed. Last dose 9/3.  Goal of Therapy:  INR 2-3 Monitor platelets by anticoagulation protocol: Yes   Plan:  Warfarin 2.5 mg po x 1 tonight Daily INR, CBC  Angela Burke, PharmD Pharmacy Resident Pager: [phone] 05/07/2016,6:32 PM

## 2016-05-08 ENCOUNTER — Observation Stay (HOSPITAL_COMMUNITY): Payer: Medicare Other

## 2016-05-08 ENCOUNTER — Observation Stay (HOSPITAL_BASED_OUTPATIENT_CLINIC_OR_DEPARTMENT_OTHER): Payer: Medicare Other

## 2016-05-08 ENCOUNTER — Other Ambulatory Visit: Payer: Self-pay | Admitting: Physician Assistant

## 2016-05-08 ENCOUNTER — Encounter (HOSPITAL_COMMUNITY): Payer: Self-pay | Admitting: General Practice

## 2016-05-08 DIAGNOSIS — E1140 Type 2 diabetes mellitus with diabetic neuropathy, unspecified: Secondary | ICD-10-CM | POA: Diagnosis not present

## 2016-05-08 DIAGNOSIS — I10 Essential (primary) hypertension: Secondary | ICD-10-CM | POA: Diagnosis not present

## 2016-05-08 DIAGNOSIS — Z7901 Long term (current) use of anticoagulants: Secondary | ICD-10-CM

## 2016-05-08 DIAGNOSIS — F419 Anxiety disorder, unspecified: Secondary | ICD-10-CM | POA: Diagnosis not present

## 2016-05-08 DIAGNOSIS — R0789 Other chest pain: Secondary | ICD-10-CM

## 2016-05-08 DIAGNOSIS — R079 Chest pain, unspecified: Secondary | ICD-10-CM

## 2016-05-08 DIAGNOSIS — J449 Chronic obstructive pulmonary disease, unspecified: Secondary | ICD-10-CM | POA: Diagnosis not present

## 2016-05-08 DIAGNOSIS — I429 Cardiomyopathy, unspecified: Secondary | ICD-10-CM

## 2016-05-08 LAB — GLUCOSE, CAPILLARY
GLUCOSE-CAPILLARY: 123 mg/dL — AB (ref 65–99)
GLUCOSE-CAPILLARY: 123 mg/dL — AB (ref 65–99)
GLUCOSE-CAPILLARY: 180 mg/dL — AB (ref 65–99)

## 2016-05-08 LAB — BASIC METABOLIC PANEL
ANION GAP: 8 (ref 5–15)
BUN: 18 mg/dL (ref 6–20)
CO2: 24 mmol/L (ref 22–32)
Calcium: 9.6 mg/dL (ref 8.9–10.3)
Chloride: 108 mmol/L (ref 101–111)
Creatinine, Ser: 0.73 mg/dL (ref 0.44–1.00)
GFR calc Af Amer: >60 mL/min (ref >60)
GFR calc non Af Amer: >60 mL/min (ref >60)
Glucose, Bld: 129 mg/dL — ABNORMAL HIGH (ref 65–99)
POTASSIUM: 3.5 mmol/L (ref 3.5–5.1)
SODIUM: 140 mmol/L (ref 135–145)

## 2016-05-08 LAB — TROPONIN I
Troponin I: <0.03 ng/mL (ref <0.03)
Troponin I: <0.03 ng/mL (ref <0.03)

## 2016-05-08 LAB — HEMOGLOBIN A1C
Hgb A1c MFr Bld: 7 % — ABNORMAL HIGH (ref 4.8–5.6)
MEAN PLASMA GLUCOSE: 154 mg/dL

## 2016-05-08 LAB — CBC
HEMATOCRIT: 36.7 % (ref 36.0–46.0)
HEMOGLOBIN: 11.2 g/dL — AB (ref 12.0–15.0)
MCH: 26.3 pg (ref 26.0–34.0)
MCHC: 30.5 g/dL (ref 30.0–36.0)
MCV: 86.2 fL (ref 78.0–100.0)
Platelets: 220 10*3/uL (ref 150–400)
RBC: 4.26 MIL/uL (ref 3.87–5.11)
RDW: 16.9 % — AB (ref 11.5–15.5)
WBC: 7.7 10*3/uL (ref 4.0–10.5)

## 2016-05-08 LAB — NM MYOCAR MULTI W/SPECT W/WALL MOTION / EF
CHL CUP MPHR: 144 {beats}/min
CHL CUP RESTING HR STRESS: 85 {beats}/min
CSEPPHR: 123 {beats}/min
Estimated workload: 1 METS
Exercise duration (min): 0 min
Exercise duration (sec): 0 s
Percent HR: 85 %
RPE: 0

## 2016-05-08 LAB — PROTIME-INR
INR: 2.72
PROTHROMBIN TIME: 29.4 s — AB (ref 11.4–15.2)

## 2016-05-08 MED ORDER — LORAZEPAM 2 MG/ML IJ SOLN
0.5000 mg | Freq: Once | INTRAMUSCULAR | Status: AC
  Administered 2016-05-08: 0.5 mg via INTRAVENOUS
  Filled 2016-05-08: qty 1

## 2016-05-08 MED ORDER — REGADENOSON 0.4 MG/5ML IV SOLN
INTRAVENOUS | Status: AC
  Filled 2016-05-08: qty 5

## 2016-05-08 MED ORDER — TECHNETIUM TC 99M TETROFOSMIN IV KIT
30.0000 | PACK | Freq: Once | INTRAVENOUS | Status: AC | PRN
  Administered 2016-05-08: 30 via INTRAVENOUS

## 2016-05-08 MED ORDER — REGADENOSON 0.4 MG/5ML IV SOLN
0.4000 mg | Freq: Once | INTRAVENOUS | Status: DC
  Filled 2016-05-08: qty 5

## 2016-05-08 MED ORDER — WARFARIN SODIUM 5 MG PO TABS
5.0000 mg | ORAL_TABLET | Freq: Once | ORAL | Status: AC
  Administered 2016-05-08: 5 mg via ORAL
  Filled 2016-05-08: qty 1

## 2016-05-08 MED ORDER — TECHNETIUM TC 99M TETROFOSMIN IV KIT
10.0000 | PACK | Freq: Once | INTRAVENOUS | Status: AC | PRN
  Administered 2016-05-08: 10 via INTRAVENOUS

## 2016-05-08 MED ORDER — REGADENOSON 0.4 MG/5ML IV SOLN
0.4000 mg | Freq: Once | INTRAVENOUS | Status: AC
  Administered 2016-05-08: 0.4 mg via INTRAVENOUS
  Filled 2016-05-08: qty 5

## 2016-05-08 NOTE — Discharge Summary (Addendum)
Physician Discharge Summary   Patient ID: Sarah Mcguire MRN: [id number] DOB/AGE: 12-09-1940 75 y.o.  Admit date: 05/07/2016 Discharge date: 05/08/2016  Primary Care Physician:  Annye Asa, MD  Discharge Diagnoses:    . Chest pain . Anxiety . COPD (chronic obstructive pulmonary disease) ([place]) . Diabetes mellitus with neuropathy ([place]) . Essential hypertension . GERD (gastroesophageal reflux disease) . Leukocytosis   Consults:  Cardiology  Recommendations for Outpatient Follow-up:  1. Please repeat CBC/BMET at next visit   DIET: Carb modified diet    Allergies:   Allergies  Allergen Reactions  . Morphine And Related     Usually drops her blood pressure  . Amoxicillin Other (See Comments)    Unknown Has patient had a PCN reaction causing immediate rash, facial/tongue/throat swelling, SOB or lightheadedness with hypotension:unsure Has patient had a PCN reaction causing severe rash involving mucus membranes or skin necrosis:unsure Has patient had a PCN reaction that required hospitalization:unsure Has patient had a PCN reaction occurring within the last 10 years:unsure If all of the above answers are "NO", then may proceed with Cephalosporin use.     Marland Kitchen Amoxicillin-Pot Clavulanate Other (See Comments)    Has patient had a PCN reaction causing immediate rash, facial/tongue/throat swelling, SOB or lightheadedness with hypotension:unsure Has patient had a PCN reaction causing severe rash involving mucus membranes or skin necrosis:unsure Has patient had a PCN reaction that required hospitalization:unsure Has patient had a PCN reaction occurring within the last 10 years:unsure If all of the above answers are "NO", then may proceed with Cephalosporin use.    . Citalopram Hydrobromide Other (See Comments)    Itching   . Clonazepam Itching  . Cymbalta [Duloxetine Hcl] Other (See Comments)    Nausea, headache and diarrhea   . Macrobid [Nitrofurantoin Macrocrystal]  Other (See Comments)    Nausea, vomitting  . Penicillins Other (See Comments)    Unknown Has patient had a PCN reaction causing immediate rash, facial/tongue/throat swelling, SOB or lightheadedness with hypotension:unsure Has patient had a PCN reaction causing severe rash involving mucus membranes or skin necrosis:unsure Has patient had a PCN reaction that required hospitalization:unsure Has patient had a PCN reaction occurring within the last 10 years:unsure If all of the above answers are "NO", then may proceed with Cephalosporin use.      . Pyridium [Phenazopyridine Hcl] Other (See Comments)    headache  . Zonegran Other (See Comments)    Unknown   . Zonisamide Other (See Comments)    Unknown   . Sertraline Hcl Anxiety  . Wellbutrin [Bupropion] Itching and Rash    Itching, rash, hyper      DISCHARGE MEDICATIONS: Current Discharge Medication List    CONTINUE these medications which have NOT CHANGED   Details  acetaminophen (TYLENOL) 325 MG tablet Take 325-650 mg by mouth every 6 (six) hours as needed (for headache).     albuterol (PROVENTIL HFA;VENTOLIN HFA) 108 (90 Base) MCG/ACT inhaler Inhale 2 puffs into the lungs every 6 (six) hours as needed for wheezing or shortness of breath. Qty: 1 Inhaler, Refills: 2    alprazolam (XANAX) 2 MG tablet TAKE 1/2-1 TABLET BY MOUTH 3 TIMES A DAY AS NEEDED FOR SLEEP OR ANXIETY Qty: 90 tablet, Refills: 1    Cholecalciferol (VITAMIN D) 2000 UNITS tablet Take 2,000 Units by mouth daily.    clobetasol cream (TEMOVATE) 0.05 % APPLY EXTERNALLY AT BEDTIME FOR ONE MONTH THEN AS NEEDED FOR ITCHING Qty: 60 g, Refills: 0    diltiazem (CARDIZEM  CD) 120 MG 24 hr capsule TAKE 1 CAPSULE (120 MG TOTAL) BY MOUTH DAILY. Qty: 90 capsule, Refills: 3    docusate sodium (COLACE) 100 MG capsule Take 100 mg by mouth 2 (two) times daily as needed for mild constipation. Reported on 03/21/2016    esomeprazole (NEXIUM) 40 MG capsule Take 1 capsule (40 mg  total) by mouth daily as needed. For acid reflux. Qty: 90 capsule, Refills: 1    fenofibrate 160 MG tablet Take 1 tablet (160 mg total) by mouth daily. Qty: 90 tablet, Refills: 1    folic acid (FOLVITE) 1 MG tablet Take 1 tablet (1 mg total) by mouth daily. Qty: 90 tablet, Refills: 1    furosemide (LASIX) 40 MG tablet TAKE 1 TABLET BY MOUTH EVERY DAY Qty: 90 tablet, Refills: 1   Associated Diagnoses: Edema    HYDROcodone-acetaminophen (NORCO/VICODIN) 5-325 MG tablet Take 1-2 tablets by mouth every 4 (four) hours as needed. Qty: 90 tablet, Refills: 0    levothyroxine (SYNTHROID, LEVOTHROID) 50 MCG tablet Take 1 tablet (50 mcg total) by mouth daily. Qty: 90 tablet, Refills: 1    lidocaine (LIDODERM) 5 % Place 1 patch onto the skin daily as needed (for pain.). Remove & Discard patch within 12 hours or as directed by MD    lidocaine (XYLOCAINE) 5 % ointment APPLY 1 APPLICATION TOPICALLY 3 (THREE) TIMES DAILY AS NEEDED. FOR ITCHING DIRECTLY TO THE SPOT Qty: 35.44 g, Refills: 0    lisinopril (PRINIVIL,ZESTRIL) 10 MG tablet Take 1 tablet (10 mg total) by mouth daily. Qty: 90 tablet, Refills: 1    metFORMIN (GLUCOPHAGE-XR) 500 MG 24 hr tablet TAKE 2 TABLETS BY MOUTH IN THE MORNING WITH FOOD AND 1 TABLET IN THE EVENING WITH FOOD. Qty: 270 tablet, Refills: 1    Multiple Vitamin (MULTIVITAMIN) tablet Take 1 tablet by mouth daily.      nystatin (MYCOSTATIN/NYSTOP) 100000 UNIT/GM POWD Apply to affected area twice daily Qty: 60 g, Refills: 3    simvastatin (ZOCOR) 20 MG tablet TAKE 1 TABLET (20 MG TOTAL) BY MOUTH EVERY EVENING. Qty: 90 tablet, Refills: 1    umeclidinium-vilanterol (ANORO ELLIPTA) 62.5-25 MCG/INH AEPB Inhale 1 puff into the lungs daily. Qty: 1 each, Refills: 0    warfarin (COUMADIN) 5 MG tablet TAKE 1 TABLET ON TUES, THURS, SATURDAY AND SUNDAY, TAKE 1/2 TAB ON REMAINING DAYS Qty: 90 tablet, Refills: 1    glucose blood (ONE TOUCH ULTRA TEST) test strip TEST TWICE A DAY AS  DIRECTED Qty: 100 each, Refills: 12      STOP taking these medications     nicotine (NICODERM CQ - DOSED IN MG/24 HR) 7 mg/24hr patch          Brief H and P: For complete details please refer to admission H and P, but in brief Patient is a 75 year old female with anxiety, anemia, atrial fibrillation on Coumadin, GERD, hypertension, hyperlipidemia, COPD, prior PE presented to ED with chest pain today. Patient and her husband described the chest pain as midsternal, started today around 11 AM when patient was sitting on the couch. The patient is a difficult historian and is not very forthcoming with history. Chest pain lasted less than an hour, with no radiation, no associated symptoms of nausea, vomiting, dizziness, diaphoresis or shortness of breath. Patient called EMS however chest pain had resolved prior to EMS arrival. Patient reports compliant with Coumadin. She does have history of GERD however he describes the chest pain was different. She has history of  significant anxiety and had a panic attack when patient was being brought here to the ED and required a 5 mg of Versed to control her anxiety. At the time of my examination, patient states that the chest pain has resolved. patient reports that she had stress testyears ago and was normal ED work-up/course:  Chest pain resolved, patient was hemodynamically stable. Afebrile, heart rate 83, respiration 24, BP 121/88, O2 sats 100%. CBC, BMET unremarkable except a previous cyst wall 0.0. INR 2.6 troponins negative, BNP 234.9    Hospital Course:   AtypicalChest pain: Respect as includes diabetes, hypertension, hyperlipidemia, patient however also has significant anxiety with panic attack, GERD.  - Troponins negative, no acute ST-T wave changes suggestive of ischemia on EKG -Cardiology consulted, recommended nuclear medicine stress test  - Continue Cardizem, lisinopril, statin -  continue PPI - Nuclear medicine stress test showed EF of  33% with no reversible ischemia or infarction, global hypokinesis - Dr. Johnsie Cancel talked to the patient and her husband in detail regarding the findings of stress test, recommended outpatient 2-D echocardiogram. She will also have outpatient follow-up appointment with Dr. Marlou Porch and cardiology is arranging both outpatient follow-up and 2-D echo. - Patient was cleared to be discharged home by cardiology  Diabetes mellitus with neuropathy ([place]) - Patient was placed on sliding scale insulin while inpatient   Leukocytosis-resolved  -  chest x-ray showed cardiomegaly with minimal right basilar atelectasis - UA negative for UTI  Essential hypertension - Currently stable, continue Cardizem, lisinopril  GERD (gastroesophageal reflux disease) - Placed on PPI  Hyperlipidemia: - Continue statin, lipid panel showed LDL 58  AnxietyWith panic attacks - Patient has significant anxiety attacks, continue Xanax   COPD (chronic obstructive pulmonary disease) ([facility]) - Currently stable, no wheezing, continue duonebs prn  Atrial fibrillation - Currently stable, continue Cardizem, Coumadin  - CHADS VASC >4  Day of Discharge BP (!) 136/100 (BP Location: Right Arm)   Pulse 99   Temp 97.4 F (36.3 C) (Oral)   Resp 18   Ht 5\' 3"  (1.6 m)   Wt 74.8 kg (165 lb)   LMP  (LMP Unknown)   SpO2 99%   BMI 29.23 kg/m   Physical Exam: General: Alert and awake oriented x3 not in any acute distress. HEENT: anicteric sclera, pupils reactive to light and accommodation CVS: S1-S2 clear no murmur rubs or gallops Chest: clear to auscultation bilaterally, no wheezing rales or rhonchi Abdomen: soft nontender, nondistended, normal bowel sounds Extremities: no cyanosis, clubbing or edema noted bilaterally Neuro: Cranial nerves II-XII intact, no focal neurological deficits   The results of significant diagnostics from this hospitalization (including imaging, microbiology, ancillary and laboratory)  are listed below for reference.    LAB RESULTS: Basic Metabolic Panel:  Recent Labs Lab 05/07/16 1447 05/08/16 0548  NA 135 140  K 3.9 3.5  CL 102 108  CO2 26 24  GLUCOSE 123* 129*  BUN 20 18  CREATININE 0.90 0.73  CALCIUM 10.4* 9.6   Liver Function Tests:  Recent Labs Lab 05/07/16 1447  AST 25  ALT 13*  ALKPHOS 48  BILITOT 0.2*  PROT 6.8  ALBUMIN 4.0   No results for input(s): LIPASE, AMYLASE in the last 168 hours. No results for input(s): AMMONIA in the last 168 hours. CBC:  Recent Labs Lab 05/07/16 1447 05/08/16 0548  WBC 12.0* 7.7  HGB 12.0 11.2*  HCT 39.1 36.7  MCV 86.5 86.2  PLT 244 220   Cardiac Enzymes:  Recent Labs  Lab 05/07/16 2333 05/08/16 0548  TROPONINI <0.03 <0.03   BNP: Invalid input(s): POCBNP CBG:  Recent Labs Lab 05/08/16 1325 05/08/16 1634  GLUCAP 123* 180*    Significant Diagnostic Studies:  Dg Chest Port 1 View  Result Date: 05/07/2016 CLINICAL DATA:  Chest pain for 2 days, history hypertension, hyperlipidemia, diabetes mellitus, pulmonary embolism, former smoker, GERD, dilated cardiomyopathy, CHF EXAM: PORTABLE CHEST 1 VIEW COMPARISON:  Portable exam 1844 hours compared to 03/19/2016 FINDINGS: Enlargement of cardiac silhouette. Atherosclerotic calcification and elongation of thoracic aorta. Mediastinal contours and pulmonary vascularity normal. Lungs appear emphysematous with minimal RIGHT basilar atelectasis. Remaining lungs clear. No pleural effusion or pneumothorax. Bones demineralized. IMPRESSION: Enlargement of cardiac silhouette. Question emphysematous changes with minimal RIGHT basilar atelectasis. Aortic atherosclerosis. Electronically Signed   By: Lavonia Dana M.D.   On: 05/07/2016 19:15   Dg Abd Portable 1 View  Result Date: 05/07/2016 CLINICAL DATA:  Back attacks.  Altered mental status today. EXAM: PORTABLE ABDOMEN - 1 VIEW COMPARISON:  CT abdomen and pelvis 01/20/2010. FINDINGS: The bowel gas pattern is normal. No  radio-opaque calculi or other significant radiographic abnormality are seen. Aortic atherosclerosis is noted. IMPRESSION: No acute abnormality. Atherosclerosis. Electronically Signed   By: Inge Rise M.D.   On: 05/07/2016 15:11   Nuclear medicine stress test FINDINGS: Perfusion: No decreased activity in the left ventricle on stress imaging to suggest reversible ischemia or infarction.  Wall Motion: Global hypokinesis.  No left ventricular dilation.  Left Ventricular Ejection Fraction: 33 %  End diastolic volume 88 ml  End systolic volume 59 ml  IMPRESSION: 1. No reversible ischemia or infarction.  2. Global hypokinesis.  3. Left ventricular ejection fraction 33%  4. Non invasive risk stratification*: High risk   Disposition and Follow-up:    DISPOSITION: [place], MD. Schedule an appointment as soon as possible for a visit in 2 week(s).   Specialty:  Family Medicine Contact information: [address] [place] [address] [phone]        [place]. Go on 05/21/2016.   Why:  @ 7:30am  Contact information: [place] [phone]       Angelena Form, PA-C. Go on 05/22/2016.   Specialties:  Cardiology, Radiology Why:  @ 9am ( please show up 15 minutes early ) Contact information: [place] [address] [phone]            Time spent on Discharge: 30 mins   Signed:   Adarryl Goldammer M.D. [facility] 05/08/2016, 5:04 PM Pager: [phone]

## 2016-05-08 NOTE — Progress Notes (Signed)
Patient ID: Sarah Mcguire, female   DOB: [birth year], 75 y.o.   MRN: [id number]  Reviewed myovue and no ischemia or infarct ? EF 33% diffuse hypokinesis Last evaluation 2014 by echo 40-45% Clinically in no CHF and on ACE  Ok to d/c home will arrange outpatient f/u with Dr Marlou Porch And echo   Discussed with patient and husband  Jenkins Rouge

## 2016-05-08 NOTE — Care Management Obs Status (Signed)
[facility] NOTIFICATION   Patient Details  Name: BROOXIE MAGDALENO MRN: [id number] Date of Birth: 09-May-1941   Medicare Observation Status Notification Given:  Yes    Dawayne Patricia, RN 05/08/2016, 4:51 PM

## 2016-05-08 NOTE — Consult Note (Signed)
CARDIOLOGY CONSULT NOTE       Patient ID: GENISYS MACKALL MRN: [id number] DOB/AGE: Sep 22, 1940 74 y.o.  Admit date: 05/07/2016 Referring Physician: Tana Coast Primary Physician: Annye Asa, MD Primary Cardiologist: Marlou Porch Reason for Consultation: Chest pain  Principal Problem:   Chest pain Active Problems:   Diabetes mellitus with neuropathy ([place])   Leukocytosis   Essential hypertension   GERD (gastroesophageal reflux disease)   Anxiety   Long term (current) use of anticoagulants   COPD (chronic obstructive pulmonary disease) ([place])   HPI:   Sarah Mcguire is a 75 y.o. Marland Kitchen female admitted for chest pain. Last seen by Dr Marlou Porch 10/21/14  She had knee replacement a few years ago and  had postop atrial fibrillation which was brief, short lived. She had a severe saddle pulmonary embolus in 2013. She also had DVT which she states did not completely resolve after leg immobilization. Apparently she is on lifelong  Echocardiogram   done in 2014, her ejection fraction was reduced, 45% range. It is likely that Lasix was started at that time to help prevent signs of heart failure.    Chest pain lasted less than an hour, with no radiation, no associated symptoms of nausea, vomiting, dizziness, diaphoresis or shortness of breath. Patient called EMS however chest pain had resolved prior to EMS arrival. Patient reports compliant with Coumadin. She does have history of GERD however he describes the chest pain was different. She has history of significant anxiety and had a panic attack when patient was being brought here to the ED and required a 5 mg of Versed to control her anxiety. At the time of my examination, patient states that the chest pain has resolved.   In ER  CXR NAD enzymes negative and no acute ECG changes    ROS All other systems reviewed and negative except as noted above  Past Medical History:  Diagnosis Date  . Anemia   . Anxiety    SEVERE  . Arthritis   . Atrial fibrillation with  RVR ([place]) 11/28/2012  . Diabetes mellitus    borderline no medications  . Diverticulitis   . DVT (deep venous thrombosis) ([place])   . GERD (gastroesophageal reflux disease)   . Hyperglycemia   . Hyperlipidemia   . Hypertension   . Leukocytosis   . Neuropathy ([place])   . PE (pulmonary embolism)    Bilateral PE, stringy saddle embolus with predominant proximal lower lobe pulmonary artery involvement 06/26/12    Family History  Problem Relation Age of Onset  . Diabetes Mother   . Hypertension Mother   . Heart disease Father     MI age 62's   . Diabetes Brother   . Hypertension Brother     Social History   Social History  . Marital status: Married    Spouse name: N/A  . Number of children: N/A  . Years of education: N/A   Occupational History  . Not on file.   Social History Main Topics  . Smoking status: Former Smoker    Packs/day: 0.50    Years: 50.00    Types: Cigarettes    Quit date: 03/18/2016  . Smokeless tobacco: Never Used  . Alcohol use No  . Drug use: No  . Sexual activity: Yes   Other Topics Concern  . Not on file   Social History Narrative  . No narrative on file    Past Surgical History:  Procedure Laterality Date  . TONSILLECTOMY    .  TOTAL KNEE ARTHROPLASTY Left 11/24/2012   Procedure: TOTAL KNEE ARTHROPLASTY;  Surgeon: Kerin Salen, MD;  Location: [facility];  Service: Orthopedics;  Laterality: Left;  . TYMPANOSTOMY TUBE PLACEMENT     R ear     . cholecalciferol  2,000 Units Oral Daily  . diltiazem  120 mg Oral Daily  . fenofibrate  160 mg Oral Daily  . insulin aspart  0-5 Units Subcutaneous QHS  . insulin aspart  0-9 Units Subcutaneous TID WC  . levothyroxine  50 mcg Oral QAC breakfast  . lisinopril  10 mg Oral Daily  . multivitamin with minerals  1 tablet Oral Daily  . regadenoson  0.4 mg Intravenous Once  . simvastatin  20 mg Oral q1800  . Warfarin - Pharmacist Dosing Inpatient   Does not apply q1800      Physical Exam: Blood pressure  108/67, pulse 90, temperature 98.3 F (36.8 C), temperature source Oral, resp. rate 20, height 5\' 3"  (1.6 m), weight 165 lb (74.8 kg), SpO2 95 %.   Affect appropriate Healthy:  appears stated age 27: normal Neck supple with no adenopathy JVP normal no bruits no thyromegaly Lungs clear with no wheezing and good diaphragmatic motion Heart:  S1/S2 no murmur, no rub, gallop or click PMI normal Abdomen: benighn, BS positve, no tenderness, no AAA no bruit.  No HSM or HJR Distal pulses intact with no bruits No edema Neuro non-focal Skin warm and dry No muscular weakness   Labs:   Lab Results  Component Value Date   WBC 7.7 05/08/2016   HGB 11.2 (L) 05/08/2016   HCT 36.7 05/08/2016   MCV 86.2 05/08/2016   PLT 220 05/08/2016    Recent Labs Lab 05/07/16 1447 05/08/16 0548  NA 135 140  K 3.9 3.5  CL 102 108  CO2 26 24  BUN 20 18  CREATININE 0.90 0.73  CALCIUM 10.4* 9.6  PROT 6.8  --   BILITOT 0.2*  --   ALKPHOS 48  --   ALT 13*  --   AST 25  --   GLUCOSE 123* 129*   Lab Results  Component Value Date   CKTOTAL 16 12/27/2009   CKMB 2.5 12/27/2009   TROPONINI <0.03 05/08/2016    Lab Results  Component Value Date   CHOL 127 05/07/2016   CHOL 147 03/14/2016   CHOL 140 09/14/2015   Lab Results  Component Value Date   HDL 49 05/07/2016   HDL 55.90 03/14/2016   HDL 46.40 09/14/2015   Lab Results  Component Value Date   LDLCALC 58 05/07/2016   LDLCALC 67 03/14/2016   LDLCALC 65 09/14/2015   Lab Results  Component Value Date   TRIG 101 05/07/2016   TRIG 125.0 03/14/2016   TRIG 144.0 09/14/2015   Lab Results  Component Value Date   CHOLHDL 2.6 05/07/2016   CHOLHDL 3 03/14/2016   CHOLHDL 3 09/14/2015   Lab Results  Component Value Date   LDLDIRECT 75.0 06/06/2009      Radiology: Dg Chest Port 1 View  Result Date: 05/07/2016 CLINICAL DATA:  Chest pain for 2 days, history hypertension, hyperlipidemia, diabetes mellitus, pulmonary embolism, former  smoker, GERD, dilated cardiomyopathy, CHF EXAM: PORTABLE CHEST 1 VIEW COMPARISON:  Portable exam 1844 hours compared to 03/19/2016 FINDINGS: Enlargement of cardiac silhouette. Atherosclerotic calcification and elongation of thoracic aorta. Mediastinal contours and pulmonary vascularity normal. Lungs appear emphysematous with minimal RIGHT basilar atelectasis. Remaining lungs clear. No pleural effusion or pneumothorax. Bones demineralized.  IMPRESSION: Enlargement of cardiac silhouette. Question emphysematous changes with minimal RIGHT basilar atelectasis. Aortic atherosclerosis. Electronically Signed   By: Lavonia Dana M.D.   On: 05/07/2016 19:15   Dg Abd Portable 1 View  Result Date: 05/07/2016 CLINICAL DATA:  Back attacks.  Altered mental status today. EXAM: PORTABLE ABDOMEN - 1 VIEW COMPARISON:  CT abdomen and pelvis 01/20/2010. FINDINGS: The bowel gas pattern is normal. No radio-opaque calculi or other significant radiographic abnormality are seen. Aortic atherosclerosis is noted. IMPRESSION: No acute abnormality. Atherosclerosis. Electronically Signed   By: Inge Rise M.D.   On: 05/07/2016 15:11    EKG:  SR PVC LVH   ASSESSMENT AND PLAN:   Chest Pain:  No known disease Some atypical features R/O have ordered lexiscan myovue for today PE:  Continue coumadin INR Rx on admission check d dimer Anxiety:  May play a role PRN xanax Chol:  On statin  HTN:  Well controlled.  Continue current medications and low sodium Dash type diet.     SignedJenkins Rouge 05/08/2016, 9:32 AM

## 2016-05-08 NOTE — Progress Notes (Signed)
Pt has been discharged home with husband. IV was removed with no complications. Telemetry box was removed. Pt and pt's husband received discharge instructions and all questions were answered. Pt and pt's husband are aware of follow-up appointments and medications to take. Pt left the unit via wheelchair and was accompanied by this RN and pt's husband. Pt left with all of her belongings. Pt was in no distress at time of discharge.   Grant Fontana BSN, RN

## 2016-05-08 NOTE — Progress Notes (Signed)
ANTICOAGULATION CONSULT NOTE - Follow Up Consult  Pharmacy Consult for Coumadin Indication: atrial fibrillation and hx VTE  Patient Measurements: Height: 5\' 3"  (160 cm) Weight: 165 lb (74.8 kg) IBW/kg (Calculated) : 52.4  Vital Signs: Temp: 97.4 F (36.3 C) (09/05 1326) Temp Source: Oral (09/05 1326) BP: 136/100 (09/05 1326) Pulse Rate: 99 (09/05 1326)  Labs:  Recent Labs  05/07/16 1447 05/07/16 2012 05/07/16 2333 05/08/16 0548  HGB 12.0  --   --  11.2*  HCT 39.1  --   --  36.7  PLT 244  --   --  220  LABPROT 28.9*  --   --  29.4*  INR 2.66  --   --  2.72  CREATININE 0.90  --   --  0.73  TROPONINI  --  <0.03 <0.03 <0.03    Estimated Creatinine Clearance: 58 mL/min (by C-G formula based on SCr of 0.8 mg/dL).  Assessment:  75 yr old female on Coumadin for afib and hx PE and DVT.  INR remains therapeutic (2.72).  Home regimen:  5 mg Tues-Thurs-Sun, 2.5 mg Mon-Fri-Sat and no Coumadin on Wednesdays.     Goal of Therapy:  INR 2-3 Monitor platelets by anticoagulation protocol: Yes   Plan:   Coumadin 5 mg today per usual Tuesday dose.  Daily PT/INR for now.  Arty Baumgartner, [place] Pager: [phone] 05/08/2016,2:54 PM

## 2016-05-08 NOTE — Discharge Instructions (Signed)

## 2016-05-08 NOTE — Progress Notes (Signed)
[facility]                                                                              Patient Demographics  Sarah Mcguire, is a 75 y.o. female, DOB - October 08, 1940, [id number]  Admit date - 05/07/2016   Admitting Physician Romen Yutzy Krystal Eaton, MD  Outpatient Primary MD for the patient is Annye Asa, MD  Outpatient specialists:   LOS - 0  days    Chief Complaint  Patient presents with  . Chest Pain  . Panic Attack       Brief summary  Patient is a 75 year old female with anxiety, anemia, atrial fibrillation on Coumadin, GERD, hypertension, hyperlipidemia, COPD, prior PE presented to ED with chest pain today. Patient and her husband described the chest pain as midsternal, started today around 11 AM when patient was sitting on the couch. The patient is a difficult historian and is not very forthcoming with history. Chest pain lasted less than an hour, with no radiation, no associated symptoms of nausea, vomiting, dizziness, diaphoresis or shortness of breath. Patient called EMS however chest pain had resolved prior to EMS arrival. Patient reports compliant with Coumadin. She does have history of GERD however he describes the chest pain was different. She has history of significant anxiety and had a panic attack when patient was being brought here to the ED and required a 5 mg of Versed to control her anxiety. At the time of my examination, patient states that the chest pain has resolved.  patient reports that she had stress test years ago and was normal  ED work-up/course:  Chest pain resolved, patient was hemodynamically stable. Afebrile, heart rate 83, respiration 24, BP 121/88, O2 sats 100%. CBC, BMET unremarkable except a previous cyst wall 0.0. INR 2.6 troponins negative, BNP 234.9    Assessment & Plan    Atypical Chest pain: Respect as includes diabetes, hypertension, hyperlipidemia, patient however also has significant anxiety with panic attack, GERD.  -  Troponins negative, no acute ST-T wave changes suggestive of ischemia on EKG -Cardiology consulted, recommended nuclear medicine stress test  - Continue Cardizem, lisinopril, statin -  continue PPI  Active Problems:   Diabetes mellitus with neuropathy (HCC) - Hold metformin, place on sliding scale insulin - Obtain hemoglobin A1c    Leukocytosis-resolved  -  chest x-ray showed cardiomegaly with minimal right basilar atelectasis - UA negative for UTI    Essential hypertension - Currently stable, continue Cardizem, lisinopril    GERD (gastroesophageal reflux disease) - Placed on PPI  Hyperlipidemia: - Continue statin, lipid panel showed LDL 58    AnxietyWith panic attacks - Patient has significant anxiety attacks, continue Xanax     COPD (chronic obstructive pulmonary disease) (HCC) - Currently stable, no wheezing, continue duonebs prn  Atrial fibrillation - Currently stable, continue Cardizem, Coumadin per pharmacy - CHADS VASC >4  Code Status: full code  DVT Prophylaxis:  coumadin Family Communication: Discussed in detail with the patient, all imaging results, lab results explained to the patient and husband    Disposition Plan: awaiting stress test   Time Spent in minutes   25 minutes  Procedures:  None   Consultants:   cardiology  Antimicrobials:      Medications  Scheduled Meds: . cholecalciferol  2,000 Units Oral Daily  . diltiazem  120 mg Oral Daily  . fenofibrate  160 mg Oral Daily  . insulin aspart  0-5 Units Subcutaneous QHS  . insulin aspart  0-9 Units Subcutaneous TID WC  . levothyroxine  50 mcg Oral QAC breakfast  . lisinopril  10 mg Oral Daily  . multivitamin with minerals  1 tablet Oral Daily  . regadenoson  0.4 mg Intravenous Once  . simvastatin  20 mg Oral q1800  . Warfarin - Pharmacist Dosing Inpatient   Does not apply q1800   Continuous Infusions:  PRN Meds:.acetaminophen, albuterol, alprazolam, docusate sodium, ondansetron  (ZOFRAN) IV   Antibiotics   Anti-infectives    None        Subjective:   [name] was seen and examined today.  Patient denies dizziness, chest pain, shortness of breath, abdominal pain, N/V/D/C, new weakness, numbess, tingling. No acute events overnight.    Objective:   Vitals:   05/07/16 2039 05/08/16 0520 05/08/16 0521 05/08/16 1001  BP: 110/74 108/67  105/71  Pulse: 85 90  85  Resp: 20     Temp: 97.7 F (36.5 C) 98.3 F (36.8 C)    TempSrc: Oral Oral    SpO2: 97% 95%    Weight:   74.8 kg (165 lb)   Height:   5\' 3"  (1.6 m)     Intake/Output Summary (Last 24 hours) at 05/08/16 1006 Last data filed at 05/07/16 2050  Gross per 24 hour  Intake                0 ml  Output              300 ml  Net             -300 ml     Wt Readings from Last 3 Encounters:  05/08/16 74.8 kg (165 lb)  04/17/16 73.5 kg (162 lb)  04/16/16 72.8 kg (160 lb 8 oz)     Exam  General: Alert and oriented x 3, NAD  HEENT:    Neck: Supple, no JVD, no masses  Cardiovascular: S1 S2 auscultated, no rubs, murmurs or gallops. Regular rate and rhythm.  Respiratory: Clear to auscultation bilaterally, no wheezing, rales or rhonchi  Gastrointestinal: Soft, nontender, nondistended, + bowel sounds  Ext: no cyanosis clubbing or edema  Neuro: AAOx3, Cr N's II- XII. Strength 5/5 upper and lower extremities bilaterally  Skin: No rashes  Psych: Normal affect and demeanor, alert and oriented x3    Data Reviewed:  I have personally reviewed following labs and imaging studies  Micro Results No results found for this or any previous visit (from the past 240 hour(s)).  Radiology Reports Dg Chest Port 1 View  Result Date: 05/07/2016 CLINICAL DATA:  Chest pain for 2 days, history hypertension, hyperlipidemia, diabetes mellitus, pulmonary embolism, former smoker, GERD, dilated cardiomyopathy, CHF EXAM: PORTABLE CHEST 1 VIEW COMPARISON:  Portable exam 1844 hours compared to 03/19/2016  FINDINGS: Enlargement of cardiac silhouette. Atherosclerotic calcification and elongation of thoracic aorta. Mediastinal contours and pulmonary vascularity normal. Lungs appear emphysematous with minimal RIGHT basilar atelectasis. Remaining lungs clear. No pleural effusion or pneumothorax. Bones demineralized. IMPRESSION: Enlargement of cardiac silhouette. Question emphysematous changes with minimal RIGHT basilar atelectasis. Aortic atherosclerosis. Electronically Signed   By: Lavonia Dana M.D.   On: 05/07/2016 19:15  Dg Abd Portable 1 View  Result Date: 05/07/2016 CLINICAL DATA:  Back attacks.  Altered mental status today. EXAM: PORTABLE ABDOMEN - 1 VIEW COMPARISON:  CT abdomen and pelvis 01/20/2010. FINDINGS: The bowel gas pattern is normal. No radio-opaque calculi or other significant radiographic abnormality are seen. Aortic atherosclerosis is noted. IMPRESSION: No acute abnormality. Atherosclerosis. Electronically Signed   By: Inge Rise M.D.   On: 05/07/2016 15:11    Lab Data:  CBC:  Recent Labs Lab 05/07/16 1447 05/08/16 0548  WBC 12.0* 7.7  HGB 12.0 11.2*  HCT 39.1 36.7  MCV 86.5 86.2  PLT 244 [id number]   Basic Metabolic Panel:  Recent Labs Lab 05/07/16 1447 05/08/16 0548  NA 135 140  K 3.9 3.5  CL 102 108  CO2 26 24  GLUCOSE 123* 129*  BUN 20 18  CREATININE 0.90 0.73  CALCIUM 10.4* 9.6   GFR: Estimated Creatinine Clearance: 58 mL/min (by C-G formula based on SCr of 0.8 mg/dL). Liver Function Tests:  Recent Labs Lab 05/07/16 1447  AST 25  ALT 13*  ALKPHOS 48  BILITOT 0.2*  PROT 6.8  ALBUMIN 4.0   No results for input(s): LIPASE, AMYLASE in the last 168 hours. No results for input(s): AMMONIA in the last 168 hours. Coagulation Profile:  Recent Labs Lab 05/07/16 1447 05/08/16 0548  INR 2.66 2.72   Cardiac Enzymes:  Recent Labs Lab 05/07/16 2012 05/07/16 2333 05/08/16 0548  TROPONINI <0.03 <0.03 <0.03   BNP (last 3 results) No results for  input(s): PROBNP in the last 8760 hours. HbA1C: No results for input(s): HGBA1C in the last 72 hours. CBG:  Recent Labs Lab 05/07/16 2154 05/08/16 0622  GLUCAP 171* 123*   Lipid Profile:  Recent Labs  05/07/16 2012  CHOL 127  HDL 49  LDLCALC 58  TRIG 101  CHOLHDL 2.6   Thyroid Function Tests: No results for input(s): TSH, T4TOTAL, FREET4, T3FREE, THYROIDAB in the last 72 hours. Anemia Panel: No results for input(s): VITAMINB12, FOLATE, FERRITIN, TIBC, IRON, RETICCTPCT in the last 72 hours. Urine analysis:    Component Value Date/Time   COLORURINE YELLOW 05/07/2016 2101   APPEARANCEUR CLEAR 05/07/2016 2101   LABSPEC 1.014 05/07/2016 2101   PHURINE 8.0 05/07/2016 2101   GLUCOSEU NEGATIVE 05/07/2016 2101   HGBUR NEGATIVE 05/07/2016 2101   HGBUR negative 10/24/2010 1140   BILIRUBINUR NEGATIVE 05/07/2016 2101   BILIRUBINUR negative 06/09/2015 1527   KETONESUR NEGATIVE 05/07/2016 2101   PROTEINUR NEGATIVE 05/07/2016 2101   UROBILINOGEN 0.2 06/09/2015 1527   UROBILINOGEN 0.2 04/04/2015 1328   NITRITE NEGATIVE 05/07/2016 2101   LEUKOCYTESUR NEGATIVE 05/07/2016 2101     Jasreet Dickie M.D. [facility] 05/08/2016, 10:06 AM  Pager: [phone] Between 7am to 7pm - call Pager - [phone]  After 7pm go to [email or web address] - password [email or web address]  Call night coverage person covering after 7pm

## 2016-05-08 NOTE — Progress Notes (Signed)
lexiscan stress portion completed without complications.  Nuc results to follow.

## 2016-05-09 ENCOUNTER — Telehealth: Payer: Self-pay | Admitting: Pulmonary Disease

## 2016-05-09 ENCOUNTER — Telehealth: Payer: Self-pay | Admitting: Family Medicine

## 2016-05-09 ENCOUNTER — Ambulatory Visit: Payer: Medicare Other

## 2016-05-09 ENCOUNTER — Telehealth: Payer: Self-pay | Admitting: *Deleted

## 2016-05-09 LAB — URINE CULTURE

## 2016-05-09 MED ORDER — FLUTICASONE FUROATE-VILANTEROL 200-25 MCG/INH IN AEPB: 1.0000 | INHALATION_SPRAY | Freq: Every day | RESPIRATORY_TRACT | 0 refills | Status: DC

## 2016-05-09 NOTE — Telephone Encounter (Signed)
Ok to schedule afternoon hospital f/u

## 2016-05-09 NOTE — Telephone Encounter (Signed)
Patient's husband returning call to schedule a post hospital appt for patient.  He is requesting afternoon appt due to having to get patient up and ready for various other appts.  Please advise if patient can be worked in an afternoon appt slot for post hospital follow-up.

## 2016-05-09 NOTE — Telephone Encounter (Signed)
Pt would like cindy to return her call

## 2016-05-09 NOTE — Telephone Encounter (Signed)
Transition Care Management Follow-up Telephone Call  Per Discharge Summary:  Admit date: 05/07/2016 Discharge date: 05/08/2016  Primary Care Physician:  Annye Asa, MD  Discharge Diagnoses:    . Chest pain . Anxiety . COPD (chronic obstructive pulmonary disease) ([place]) . Diabetes mellitus with neuropathy ([place]) . Essential hypertension . GERD (gastroesophageal reflux disease) . Leukocytosis   Consults:  Cardiology  Recommendations for Outpatient Follow-up:  1. Please repeat CBC/BMET at next visit   DIET: Carb modified diet  --   How have you been since you were released from the hospital? "Well I just got out yesterday and I feel fine this morning."   Do you understand why you were in the hospital? yes   Do you understand the discharge instructions? yes   Where were you discharged to? Home  Remainder of call completed w/ pt's husband per pt request.  Items Reviewed:  Medications reviewed: no, pt declined  Allergies reviewed: yes  Dietary changes reviewed: no, none made per pt's husband  Referrals reviewed: yes, echo on 05/21/16 and cardiology   Functional Questionnaire:  Activities of Daily Living (ADLs):   She states they are independent in the following: ambulation, feeding, continence, grooming, toileting and dressing States they require assistance with the following: bathing and hygiene-husband helps w/ bathing   Any transportation issues/concerns?: no   Any patient concerns? no   Confirmed importance and date/time of follow-up visits scheduled no, unable to schedule. Pt's husband requests afternoon appt and had to take another call before I was able to schedule.   Confirmed with patient if condition begins to worsen call PCP or go to the ER.  Patient was given the office number and encouraged to call back with question or concerns.  : yes

## 2016-05-09 NOTE — Telephone Encounter (Signed)
Levada Dy, would you please call pt's husband to schedule 30 min hospital follow-up appt on or before 05/22/16? He would like an afternoon appt around 2pm if possible. I know that is not a typical HFU spot. Thanks.

## 2016-05-09 NOTE — Telephone Encounter (Signed)
Spoke with spouse- he is aware of rec's from [facility] and will come by to pick up samples of Breo 200 inhaler. Appointment made with RA on 05/25/16 at 9:15am for follow up. Pt was on phone as well and is aware to take Breo 200 once daily and brush, rinse, gargle, and spit after use. Samples placed at front. Anoro removed from med list.

## 2016-05-09 NOTE — Telephone Encounter (Signed)
Returned patient's call.  INR appointment scheduled for 9/25 at the [place] coumadin clinic.

## 2016-05-09 NOTE — Telephone Encounter (Signed)
We should try her on Breo 200. Please give her samples & instruct her to use 1 inhalation daily. Ensure she is aware that she should rinse, gargle, & spit after using the medication. She should also make sure any dentures or partials are removed before using the inhaler. If she has teeth she should also brush her teeth & tongue after using again rinsing, gargling, & spitting. We should get her an appointment to see the next available provider in the next 1-2 weeks. If she has any further chest discomfort after stopping the Anoro then it's unlikely this is the culprit medication.

## 2016-05-09 NOTE — Telephone Encounter (Signed)
Sarah Mcguire pt would like to have a call.

## 2016-05-09 NOTE — Telephone Encounter (Signed)
Spoke with husband-pt was having CP and questionable MI-05-07-16; blood tests, stress tests ran and everything normal. Was told that Anoro could cause CP and to stop the medication. Pt needs replacement medication.    JN please advise as RA is out of the office this week.    Next OV with RA with 07/18/16.

## 2016-05-09 NOTE — Telephone Encounter (Signed)
Pt has been scheduled.  °

## 2016-05-11 ENCOUNTER — Telehealth: Payer: Self-pay | Admitting: Pulmonary Disease

## 2016-05-11 ENCOUNTER — Encounter: Payer: Self-pay | Admitting: Family Medicine

## 2016-05-11 ENCOUNTER — Other Ambulatory Visit: Payer: Self-pay | Admitting: Family Medicine

## 2016-05-11 NOTE — Telephone Encounter (Signed)
All inhalers have this x ICS which aren't going to work in this setting, suggest she discuss with Dr Elsworth Soho on return and just takes meds as she is until f/u

## 2016-05-11 NOTE — Telephone Encounter (Signed)
Patient notified.  No questions or concerns at this time. Nothing further needed.   

## 2016-05-11 NOTE — Telephone Encounter (Signed)
Medication filled to pharmacy as requested.   

## 2016-05-11 NOTE — Telephone Encounter (Signed)
Last OV 04/16/16 Alprazolam last filled 03/16/16 #90 with 1

## 2016-05-11 NOTE — Telephone Encounter (Signed)
Patients husband called and said that patient is scared to take the Breo samples that were given.  The bottle looks just like the Anoro and the Anoro put her in the hospital with chest pains.  He said they read the side effects from [facility] and it said "could cause chest pain".  He wants to know if patient can be switched to something that does not have Chest pain as the side effect. Advised patient that Dr. Elsworth Soho is out of the office until Monday, they wanted me to send message to DOD to see if he has any recommendations.  Dr. Melvyn Novas, please advise in Dr. Bari Mantis absence.  Instructions      Return in about 3 months (around 07/18/2016).  Wear oxygen 2l/m with activity and At bedtime  .  Continue on ANORO 1 puff daily  Follow up with Dr. Elsworth Soho  In 3 months and As needed       After Visit Summary (Printed 04/17/2016)   Javier Glazier, MD      05/09/16 9:40 AM  Note    We should try her on Breo 200. Please give her samples & instruct her to use 1 inhalation daily. Ensure she is aware that she should rinse, gargle, & spit after using the medication. She should also make sure any dentures or partials are removed before using the inhaler. If she has teeth she should also brush her teeth & tongue after using again rinsing, gargling, & spitting. We should get her an appointment to see the next available provider in the next 1-2 weeks. If she has any further chest discomfort after stopping the Anoro then it's unlikely this is the culprit medication.

## 2016-05-17 ENCOUNTER — Encounter: Payer: Self-pay | Admitting: Family Medicine

## 2016-05-17 ENCOUNTER — Ambulatory Visit (INDEPENDENT_AMBULATORY_CARE_PROVIDER_SITE_OTHER): Payer: Medicare Other | Admitting: Family Medicine

## 2016-05-17 VITALS — BP 114/82 | HR 80 | Temp 98.0°F | Resp 16 | Ht 63.0 in | Wt 161.5 lb

## 2016-05-17 DIAGNOSIS — R079 Chest pain, unspecified: Secondary | ICD-10-CM

## 2016-05-17 DIAGNOSIS — Z5189 Encounter for other specified aftercare: Secondary | ICD-10-CM

## 2016-05-17 LAB — CBC WITH DIFFERENTIAL/PLATELET
BASOS PCT: 0 %
Basophils Absolute: 0 cells/uL (ref 0–200)
Eosinophils Absolute: 188 cells/uL (ref 15–500)
Eosinophils Relative: 2 %
HCT: 35.1 % (ref 35.0–45.0)
HEMOGLOBIN: 11.3 g/dL — AB (ref 11.7–15.5)
LYMPHS PCT: 28 %
Lymphs Abs: 2632 cells/uL (ref 850–3900)
MCH: 26.7 pg — AB (ref 27.0–33.0)
MCHC: 32.2 g/dL (ref 32.0–36.0)
MCV: 82.8 fL (ref 80.0–100.0)
MONOS PCT: 8 %
MPV: 12.4 fL (ref 7.5–12.5)
Monocytes Absolute: 752 cells/uL (ref 200–950)
NEUTROS PCT: 62 %
Neutro Abs: 5828 cells/uL (ref 1500–7800)
Platelets: 290 10*3/uL (ref 140–400)
RBC: 4.24 MIL/uL (ref 3.80–5.10)
RDW: 16.5 % — AB (ref 11.0–15.0)
WBC: 9.4 10*3/uL (ref 3.8–10.8)

## 2016-05-17 LAB — BASIC METABOLIC PANEL
BUN: 25 mg/dL (ref 7–25)
CO2: 28 mmol/L (ref 20–31)
Calcium: 10.1 mg/dL (ref 8.6–10.4)
Chloride: 104 mmol/L (ref 98–110)
Creat: 0.92 mg/dL (ref 0.60–0.93)
Glucose, Bld: 85 mg/dL (ref 65–99)
Potassium: 4.5 mmol/L (ref 3.5–5.3)
Sodium: 140 mmol/L (ref 135–146)

## 2016-05-17 NOTE — Progress Notes (Signed)
   Subjective:    Patient ID: Sarah Mcguire, female    DOB: Apr 02, 1941, 75 y.o.   MRN: [id number]  [facility] f/u- pt was admitted 9/4-9/5 w/ CP.  Pt called EMS due to CP but it resolved prior to EMS arrival.  Was difficult historian in ER and had panic attack at the time.  Had (-) Troponin, nuclear stress showed EF of 33%.  Cards recommended outpt 2D ECHO and f/u w/ Dr Marlou Porch.  Pt was told to stop Anoro and start Breo.  Pt has f/u w/ Dr Elsworth Soho on 9/22.  Pt is very upset today about $10,000 bill they received for her overnight stay.  Pt also states, 'that place is nasty.  They need to clean it up'.  Pt has not had any CP since her hospitalization.  Pt states that since stopping her inhaler, she has not had CP.  Pt reports that her O2 sats are good 'on and off this' oxygen.  Denies SOB.   Review of Systems For ROS see HPI     Objective:   Physical Exam  Constitutional: She is oriented to person, place, and time. She appears well-developed and well-nourished. No distress.  HENT:  Head: Normocephalic and atraumatic.  O2 in place via [place]  Eyes: Conjunctivae and EOM are normal. Pupils are equal, round, and reactive to light.  Neck: Normal range of motion. Neck supple. No thyromegaly present.  Cardiovascular: Normal rate, regular rhythm, normal heart sounds and intact distal pulses.   No murmur heard. Pulmonary/Chest: Effort normal and breath sounds normal. No respiratory distress.  Abdominal: Soft. She exhibits no distension. There is no tenderness.  Musculoskeletal: She exhibits no edema.  Lymphadenopathy:    She has no cervical adenopathy.  Neurological: She is alert and oriented to person, place, and time.  Skin: Skin is warm and dry.  Psychiatric: She has a normal mood and affect. Her behavior is normal.  Vitals reviewed.         Assessment & Plan:

## 2016-05-17 NOTE — Assessment & Plan Note (Signed)
Pt is convinced it was her Anoro that caused her CP.  Nuclear stress and troponins were negative during her stay.  Pt has f/u w/ cards and outpt ECHO pending.  Pt was switched to [facility] but has not started it b/c she is convinced it will cause similar sxs.  She is very angry today and upset with everything from her hospital stay to her bill to her upcoming pulmonary appt.  She wants her O2 to be D/C'd but I told her as I was not the one who started this, I would not D/C it- and she can address this w/ pulmonary next week.  She was upset about that as well.  Will get CBC and BMP today as was recommended in the D/C summary.  No med changes at this time.

## 2016-05-17 NOTE — Patient Instructions (Signed)
Follow up as scheduled We'll notify you of your lab results and make any changes if needed Follow up w/ cardiology and pulmonary as scheduled Call with any questions or concerns Hang in there!!!

## 2016-05-17 NOTE — Progress Notes (Signed)
Pre visit review using our clinic review tool, if applicable. No additional management support is needed unless otherwise documented below in the visit note. 

## 2016-05-20 NOTE — Progress Notes (Signed)
Cardiology Office Note    Date:  05/22/2016   ID:  Sarah Mcguire, DOB [birth year], MRN [id number]  PCP:  Annye Asa, MD  Cardiologist: Dr. Marlou Porch  CC: post hosp f/u  History of Present Illness:  Sarah Mcguire is a 75 y.o. 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   She had knee replacement a few years ago and had postop atrial fibrillation which was brief, short lived. She had a severe saddle pulmonary embolus in 2013. She also had DVT which did not completely resolve after leg immobilization. Apparently she is on lifelong oral anticoagulation with coumadin. Echocardiogram in 2014 showed her ejection fraction was reduced, 45% range. It is likely that Lasix was started at that time to help prevent signs of heart failure.    She was recently admitted 9/4-05/08/16 for atypical chest pain and significant anxiety. Dr. Johnsie Cancel saw her in consult and ordered a myovue which showed no ischemia or infarct but EF 33% diffuse hypokinesis. (Last evaluation 2014 by echo 40-45%) Clinically, she had no CHF and on ACE. She was anxious to go home and it was decided to discharge her home with outpatient echo and follow up.  Follow up echo yesterday showed worsening of her EF to 25-30%, no RWMAs, mild focal basal hypertrophy of the septum, G1DD with no evidence of elevated filling pressures, mildly reduced RV systolic function,.   Today she presents to clinic for follow up. She is with her husband. No more chest pain. No SOB. Stays on chronic 02. No LE edema (left leg always  A little larger than right 2/2 DVT). No orthopnea or PND. NO dizziness or syncope. No palpitations.   Past Medical History:  Diagnosis Date  . Anemia   . Anxiety    SEVERE  . Arthritis    "probably in my back" (05/08/2016)    . Atrial fibrillation with RVR ([place]) 11/28/2012  . Chronic lower back pain   . Diverticulitis   . DVT (deep venous thrombosis) ([place])   . Emphysema of lung ([place])   . GERD (gastroesophageal reflux disease)   . High cholesterol   . Hyperglycemia   . Hyperlipidemia   . Hypertension   . Leukocytosis   . Neuropathy ([place])   . On home oxygen therapy    "2L; 24/7 prn" (05/08/2016)  . PE (pulmonary embolism)    Bilateral PE, stringy saddle embolus with predominant proximal lower lobe pulmonary artery involvement 06/26/12  . Type II diabetes mellitus ([place])     Past Surgical History:  Procedure Laterality Date  . CATARACT EXTRACTION W/ INTRAOCULAR LENS  IMPLANT, BILATERAL Bilateral 08/2013  . JOINT REPLACEMENT    . TONSILLECTOMY    . TOTAL KNEE ARTHROPLASTY Left 11/24/2012   Procedure: TOTAL KNEE ARTHROPLASTY;  Surgeon: Kerin Salen, MD;  Location: [facility];  Service: Orthopedics;  Laterality: Left;  . TYMPANOSTOMY TUBE PLACEMENT Right early 2000s    Current Medications: Outpatient Medications Prior to Visit  Medication Sig Dispense Refill  . acetaminophen (TYLENOL) 325 MG tablet Take 325-650 mg by mouth every 6 (six) hours as needed (for headache).     Marland Kitchen albuterol (PROVENTIL HFA;VENTOLIN HFA) 108 (90 Base) MCG/ACT inhaler Inhale 2 puffs into the lungs every 6 (six) hours as needed for wheezing or  shortness of breath. 1 Inhaler 2  . alprazolam (XANAX) 2 MG tablet TAKE 1/2 TO 1 TABLET BY MOUTH 3 TIMES A DAY AS NEEDED FOR SLEEP OR ANXIETY 90 tablet 1  . Cholecalciferol (VITAMIN D) 2000 UNITS tablet Take 2,000 Units by mouth daily.    . clobetasol cream (TEMOVATE) 0.05 % APPLY EXTERNALLY AT BEDTIME FOR ONE MONTH THEN AS NEEDED FOR ITCHING 60 g 0  . docusate sodium (COLACE) 100 MG capsule Take 100 mg by mouth 2 (two) times daily as needed for mild constipation. Reported on 03/21/2016    . esomeprazole (NEXIUM) 40 MG capsule Take 1 capsule (40 mg total) by mouth daily as needed. For acid reflux.  (Patient taking differently: Take 40 mg by mouth daily before breakfast. For acid reflux.) 90 capsule 1  . fenofibrate 160 MG tablet Take 1 tablet (160 mg total) by mouth daily. 90 tablet 1  . fluticasone furoate-vilanterol (BREO ELLIPTA) 200-25 MCG/INH AEPB Inhale 1 puff into the lungs daily. 2 each 0  . folic acid (FOLVITE) 1 MG tablet Take 1 tablet (1 mg total) by mouth daily. 90 tablet 1  . furosemide (LASIX) 40 MG tablet TAKE 1 TABLET BY MOUTH EVERY DAY 90 tablet 1  . glucose blood (ONE TOUCH ULTRA TEST) test strip TEST TWICE A DAY AS DIRECTED 100 each 12  . HYDROcodone-acetaminophen (NORCO/VICODIN) 5-325 MG tablet Take 1-2 tablets by mouth every 4 (four) hours as needed. 90 tablet 0  . levothyroxine (SYNTHROID, LEVOTHROID) 50 MCG tablet Take 1 tablet (50 mcg total) by mouth daily. (Patient taking differently: Take 50 mcg by mouth daily before breakfast. ) 90 tablet 1  . lidocaine (LIDODERM) 5 % Place 1 patch onto the skin daily as needed (for pain.). Remove & Discard patch within 12 hours or as directed by MD    . lidocaine (XYLOCAINE) 5 % ointment APPLY 1 APPLICATION TOPICALLY 3 (THREE) TIMES DAILY AS NEEDED. FOR ITCHING DIRECTLY TO THE SPOT 35.44 g 0  . lisinopril (PRINIVIL,ZESTRIL) 10 MG tablet Take 1 tablet (10 mg total) by mouth daily. 90 tablet 1  . metFORMIN (GLUCOPHAGE-XR) 500 MG 24 hr tablet TAKE 2 TABLETS BY MOUTH IN THE MORNING WITH FOOD AND 1 TABLET IN THE EVENING WITH FOOD. 270 tablet 1  . Multiple Vitamin (MULTIVITAMIN) tablet Take 1 tablet by mouth daily.      Marland Kitchen nystatin (MYCOSTATIN/NYSTOP) 100000 UNIT/GM POWD Apply to affected area twice daily (Patient taking differently: Apply 1 g topically 2 (two) times daily as needed (for irritation of skin folds). Apply to affected area twice daily) 60 g 3  . simvastatin (ZOCOR) 20 MG tablet TAKE 1 TABLET (20 MG TOTAL) BY MOUTH EVERY EVENING. 90 tablet 1  . warfarin (COUMADIN) 5 MG tablet TAKE 1 TABLET ON TUES, THURS, SATURDAY AND SUNDAY,  TAKE 1/2 TAB ON REMAINING DAYS (Patient taking differently: Take 5 mg Tues, Thurs, Sun. TAKE 2.5 mg Mon, Fri, Sat. NO Coumadin on Wed.) 90 tablet 1  . diltiazem (CARDIZEM CD) 120 MG 24 hr capsule TAKE 1 CAPSULE (120 MG TOTAL) BY MOUTH DAILY. 90 capsule 3   No facility-administered medications prior to visit.      Allergies:   Morphine and related; Amoxicillin; Amoxicillin-pot clavulanate; Citalopram hydrobromide; Clonazepam; Cymbalta [duloxetine hcl]; Macrobid [nitrofurantoin macrocrystal]; Penicillins; Pyridium [phenazopyridine hcl]; Zonegran; Zonisamide; Sertraline hcl; and Wellbutrin [bupropion]   Social History   Social History  . Marital status: Married    Spouse name: N/A  . Number of children: N/A  .  Years of education: N/A   Social History Main Topics  . Smoking status: Former Smoker    Packs/day: 0.50    Years: 60.00    Types: Cigarettes    Quit date: 03/18/2016  . Smokeless tobacco: Never Used  . Alcohol use No  . Drug use: No  . Sexual activity: Yes   Other Topics Concern  . None   Social History Narrative  . None     Family History:  The patient's *family history includes Diabetes in her brother and mother; Heart disease in her father; Hypertension in her brother and mother.     ROS:   Please see the history of present illness.    ROS All other systems reviewed and are negative.   PHYSICAL EXAM:   VS:  BP 114/64   Pulse 91   Ht 5\' 3"  (1.6 m)   Wt 162 lb 1.9 oz (73.5 kg)   LMP  (LMP Unknown)   SpO2 97%   BMI 28.72 kg/m    GEN: Well nourished, well developed, in no acute distress  Obese, in wheelchair HEENT: normal  Neck: no JVD, carotid bruits, or masses Cardiac: RRR; no murmurs, rubs, or gallops,mild LLE edema  Respiratory:  clear to auscultation bilaterally, normal work of breathing GI: soft, nontender, nondistended, + BS MS: no deformity or atrophy  Skin: warm and dry, no rash Neuro:  Alert and Oriented x 3, Strength and sensation are  intact Psych: euthymic mood, full affect  Wt Readings from Last 3 Encounters:  05/22/16 162 lb 1.9 oz (73.5 kg)  05/17/16 161 lb 8 oz (73.3 kg)  05/08/16 165 lb (74.8 kg)      Studies/Labs Reviewed:   EKG:  EKG is NOT ordered today.   Recent Labs: 04/16/2016: TSH 2.47 05/07/2016: ALT 13; B Natriuretic Peptide 234.9 05/17/2016: BUN 25; Creat 0.92; Hemoglobin 11.3; Platelets 290; Potassium 4.5; Sodium 140   Lipid Panel    Component Value Date/Time   CHOL 127 05/07/2016 2012   TRIG 101 05/07/2016 2012   HDL 49 05/07/2016 2012   CHOLHDL 2.6 05/07/2016 2012   VLDL 20 05/07/2016 2012   Animas 58 05/07/2016 2012   LDLDIRECT 75.0 06/06/2009 1042    Additional studies/ records that were reviewed today include:  Myoview 05/08/16 FINDINGS: Perfusion: No decreased activity in the left ventricle on stress imaging to suggest reversible ischemia or infarction. Wall Motion: Global hypokinesis.  No left ventricular dilation. Left Ventricular Ejection Fraction: 33 %  2D ECHO: 05/21/2016 LV EF: 25% -   30% Study Conclusions - Left ventricle: The cavity size was normal. There was mild focal   basal hypertrophy of the septum. Systolic function was severely   reduced. The estimated ejection fraction was in the range of 25%   to 30%. Wall motion was normal; there were no regional wall   motion abnormalities. Doppler parameters are consistent with   abnormal left ventricular relaxation (grade 1 diastolic   dysfunction). There was no evidence of elevated ventricular   filling pressure by Doppler parameters. - Aortic valve: There was trivial regurgitation. - Aortic root: The aortic root was normal in size. - Right ventricle: Systolic function was mildly reduced. - Right atrium: The atrium was normal in size. - Tricuspid valve: There was no regurgitation. - Pulmonary arteries: Systolic pressure was within the normal   range. - Inferior vena cava: The vessel was normal in size. - Pericardium,  extracardiac: There was no pericardial effusion. Impressions: - Compared to the  prior study from 11/28/2012 LVEF has decreased   from 40-45% to 25-30%, RVEF is moderately decreased.   ASSESSMENT & PLAN:   Sarah Mcguire is a 75 y.o. 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   Chest pain: resolved. Recent myoview low risk for ischemia. She has not had anymore chest pain since discharge.   Chronic systolic CHF: 2D ECHO showed EF down from 40-45%--> 25-30%. Mild RV systolic dysfunction. Appears Euvolemic. Continue lasix and ACEI. Will stop Cardizem CD 120mg  daily and start Toprol Xl 25mg  daily.   HTN: BP well controlled on current regimen  HLD: continue statin  Anxiety: follow up with PCP  Hx of DVT/PE: continue coumadin  PAF: brief post op afib with no recurrence. CHADSVASC of at least 4 on coumadin for DVT/PE   Medication Adjustments/Labs and Tests Ordered: Current medicines are reviewed at length with the patient today.  Concerns regarding medicines are outlined above.  Medication changes, Labs and Tests ordered today are listed in the Patient Instructions below. Patient Instructions  Medication Instructions:  Your physician has recommended you make the following change in your medication:  1.  STOP the Diltiazem CD 2.  START Toprol XL 25 mg taking 1 tablet daily   Labwork: None ordered  Testing/Procedures: None ordered  Follow-Up: Your physician recommends that you schedule a follow-up appointment in: 3 MONTHS WITH DR. Marlou Porch  Any Other Special Instructions Will Be Listed Below (If Applicable).     If you need a refill on your cardiac medications before your next appointment, please call your pharmacy.      Signed, Angelena Form, PA-C  05/22/2016 9:18 AM    [facility] [place], [place], [place]  [address] Phone: [phone]; Fax: [phone]

## 2016-05-21 ENCOUNTER — Other Ambulatory Visit (HOSPITAL_COMMUNITY): Payer: Medicare Other

## 2016-05-21 ENCOUNTER — Ambulatory Visit (HOSPITAL_COMMUNITY): Payer: Medicare Other | Attending: Cardiology

## 2016-05-21 ENCOUNTER — Encounter: Payer: Self-pay | Admitting: Physician Assistant

## 2016-05-21 ENCOUNTER — Other Ambulatory Visit: Payer: Self-pay

## 2016-05-21 DIAGNOSIS — Z720 Tobacco use: Secondary | ICD-10-CM | POA: Insufficient documentation

## 2016-05-21 DIAGNOSIS — I10 Essential (primary) hypertension: Secondary | ICD-10-CM | POA: Diagnosis not present

## 2016-05-21 DIAGNOSIS — I4891 Unspecified atrial fibrillation: Secondary | ICD-10-CM | POA: Diagnosis not present

## 2016-05-21 DIAGNOSIS — I420 Dilated cardiomyopathy: Secondary | ICD-10-CM | POA: Diagnosis not present

## 2016-05-21 DIAGNOSIS — E785 Hyperlipidemia, unspecified: Secondary | ICD-10-CM | POA: Insufficient documentation

## 2016-05-21 DIAGNOSIS — E669 Obesity, unspecified: Secondary | ICD-10-CM | POA: Insufficient documentation

## 2016-05-21 DIAGNOSIS — I4892 Unspecified atrial flutter: Secondary | ICD-10-CM | POA: Insufficient documentation

## 2016-05-21 DIAGNOSIS — I2510 Atherosclerotic heart disease of native coronary artery without angina pectoris: Secondary | ICD-10-CM | POA: Diagnosis not present

## 2016-05-21 DIAGNOSIS — J449 Chronic obstructive pulmonary disease, unspecified: Secondary | ICD-10-CM | POA: Insufficient documentation

## 2016-05-21 DIAGNOSIS — I429 Cardiomyopathy, unspecified: Secondary | ICD-10-CM

## 2016-05-21 DIAGNOSIS — E119 Type 2 diabetes mellitus without complications: Secondary | ICD-10-CM | POA: Insufficient documentation

## 2016-05-21 DIAGNOSIS — I351 Nonrheumatic aortic (valve) insufficiency: Secondary | ICD-10-CM | POA: Diagnosis not present

## 2016-05-22 ENCOUNTER — Telehealth: Payer: Self-pay | Admitting: Cardiology

## 2016-05-22 ENCOUNTER — Encounter: Payer: Self-pay | Admitting: Physician Assistant

## 2016-05-22 ENCOUNTER — Ambulatory Visit (INDEPENDENT_AMBULATORY_CARE_PROVIDER_SITE_OTHER): Payer: Medicare Other | Admitting: Physician Assistant

## 2016-05-22 VITALS — BP 114/64 | HR 91 | Ht 63.0 in | Wt 162.1 lb

## 2016-05-22 DIAGNOSIS — E785 Hyperlipidemia, unspecified: Secondary | ICD-10-CM

## 2016-05-22 DIAGNOSIS — J449 Chronic obstructive pulmonary disease, unspecified: Secondary | ICD-10-CM

## 2016-05-22 DIAGNOSIS — I5022 Chronic systolic (congestive) heart failure: Secondary | ICD-10-CM | POA: Diagnosis not present

## 2016-05-22 DIAGNOSIS — I10 Essential (primary) hypertension: Secondary | ICD-10-CM | POA: Diagnosis not present

## 2016-05-22 DIAGNOSIS — Z86718 Personal history of other venous thrombosis and embolism: Secondary | ICD-10-CM | POA: Diagnosis not present

## 2016-05-22 DIAGNOSIS — I480 Paroxysmal atrial fibrillation: Secondary | ICD-10-CM

## 2016-05-22 MED ORDER — METOPROLOL SUCCINATE ER 25 MG PO TB24: 25.0000 mg | ORAL_TABLET | Freq: Every day | ORAL | 3 refills | Status: AC

## 2016-05-22 NOTE — Telephone Encounter (Signed)
New message    Pt calling to get a clearly understanding of the diagnosis of the heart during her appt today. Please call.

## 2016-05-22 NOTE — Telephone Encounter (Signed)
Pt wanted to verify the EF of her heart. Pt    was seen in this office today. Pt  wanted to make sure of what she was told by the PA. Pt was made aware that the PA's written note states that  pt's EF was 25 to 20 % down from 40 -45 %. PA started pt on Toprol XL 25 mg by mouth daily. Pt to stop the [place] CD. Pt was asked if she was in danger to have a heart attack. Pt was made aware that the EF  shows that her heart is weaker to pump the blood out of her heart, not that she was about to have a heart attack. Pt verbalized understanding.

## 2016-05-22 NOTE — Patient Instructions (Addendum)
Medication Instructions:  Your physician has recommended you make the following change in your medication:  1.  STOP the Diltiazem CD 2.  START Toprol XL 25 mg taking 1 tablet daily   Labwork: None ordered  Testing/Procedures: None ordered  Follow-Up: Your physician recommends that you schedule a follow-up appointment in: 3 MONTHS WITH DR. Marlou Porch  Any Other Special Instructions Will Be Listed Below (If Applicable).     If you need a refill on your cardiac medications before your next appointment, please call your pharmacy.

## 2016-05-22 NOTE — Progress Notes (Signed)
Her ejection fraction is fairly low 25-30%, decreased from prior. Even though nuclear stress test did not show any significant evidence pointing towards ischemia, I would perform a diagnostic heart catheterization to ensure that she does not have triple-vessel coronary artery disease.  Candee Furbish, MD

## 2016-05-23 ENCOUNTER — Telehealth: Payer: Self-pay | Admitting: Physician Assistant

## 2016-05-23 NOTE — Telephone Encounter (Signed)
.     Discussed case with Dr. Marlou Porch who thinks she needs a heart cath. I will have my nurse call her to discuss with her and get her into the coumadin clinic next week for consultation to help me with bridging with Lovenox. She is on Coumadin for chronic DVT with previous PE.   Angelena Form PA-C  [facility]

## 2016-05-23 NOTE — Progress Notes (Signed)
She is on coumadin for PAF and chronic DVT with previous PE. Do we need to bridge? I can get her set up with coumadin clinic to help manage this. Cath when INR under 1.8?

## 2016-05-24 ENCOUNTER — Telehealth: Payer: Self-pay | Admitting: *Deleted

## 2016-05-24 ENCOUNTER — Telehealth: Payer: Self-pay | Admitting: Pulmonary Disease

## 2016-05-24 NOTE — Progress Notes (Signed)
Noted. Thanks.

## 2016-05-24 NOTE — Progress Notes (Signed)
I will be happy to take care of this!  Villa Herb, RN

## 2016-05-24 NOTE — Telephone Encounter (Signed)
Called pt to let her know that Nell Range, PA-C had discussed her with Dr. Marlou Porch and that he felt the pt needed a heart cath.  Pt is on Coumadin and will need Louvenox bridge.  Information has been sent to Dr. Birdie Riddle, PCP, who handles pts Coumadin, and pt is scheduled with them 05/28/16. Pt scheduled to see Joellen Jersey 05/30/16 to discuss heart cath.  Pt and husband agreeable with this plan.

## 2016-05-24 NOTE — Telephone Encounter (Signed)
Called spoke with pt spouse. He reports pt is currently not using any over her inhalers except for proait q6hrs daily. I advised pt should keep appt to get her medications correct. He will do so. Nothing further needed

## 2016-05-24 NOTE — Telephone Encounter (Signed)
-----   Message from Sarah Mcguire, [place] sent at 05/24/2016  8:10 AM EDT ----- Arloa Koh bring her into the coumadin clinic as soon as they can get her in and she can get in for a coumadin lovenox bridge. We can put her on my Wednesday clinic schedule to discuss cath   ----- Message ----- From: Jeanann Lewandowsky, RMA Sent: 05/24/2016   8:03 AM To: Sarah Stanford, PA-C  Is this the pt you were talking about?  What do you want me to tell her? ----- Message ----- From: Sarah Stanford, PA-C Sent: 05/23/2016  11:41 AM To: Jeanann Lewandowsky, RMA  Can we get her into coumadin clinic early next week to get her set up for a coumadin lovenox bridge and then we will set her up for cath when INR low enough? Thanks!!!

## 2016-05-25 ENCOUNTER — Encounter: Payer: Self-pay | Admitting: General Practice

## 2016-05-25 ENCOUNTER — Telehealth: Payer: Self-pay | Admitting: Cardiology

## 2016-05-25 ENCOUNTER — Telehealth: Payer: Self-pay | Admitting: General Practice

## 2016-05-25 ENCOUNTER — Ambulatory Visit (INDEPENDENT_AMBULATORY_CARE_PROVIDER_SITE_OTHER): Payer: Medicare Other | Admitting: Pulmonary Disease

## 2016-05-25 ENCOUNTER — Telehealth: Payer: Self-pay | Admitting: Emergency Medicine

## 2016-05-25 ENCOUNTER — Encounter: Payer: Self-pay | Admitting: Pulmonary Disease

## 2016-05-25 DIAGNOSIS — J9611 Chronic respiratory failure with hypoxia: Secondary | ICD-10-CM | POA: Diagnosis not present

## 2016-05-25 DIAGNOSIS — J439 Emphysema, unspecified: Secondary | ICD-10-CM

## 2016-05-25 NOTE — Telephone Encounter (Signed)
Yes stop coumadin Sunday. I will see in clinic wednesday

## 2016-05-25 NOTE — Telephone Encounter (Signed)
Per the cardiology PA- she is to stop her Coumadin on Sunday

## 2016-05-25 NOTE — Assessment & Plan Note (Signed)
Our recommendation for COPD would be to use medication like ANORO or Breo - please check with your heart doctor if this is okay  Can use albuterol 2 puffs as needed for shortness of breath or wheezing  Flu shot is recommended

## 2016-05-25 NOTE — Assessment & Plan Note (Addendum)
Our recommendation is to keep oxygen levels above 88% Try to use oxygen during sleep and when walking- okay to stay off oxygen at rest. Sequelae of long-term hypoxia for pulmonary hypertension and heart failure were discussed. She is adamant that she does not want to use oxygen

## 2016-05-25 NOTE — Telephone Encounter (Signed)
Pt gave husband the telephone as he had many questions.  He is asking about the instructions given to hold Coumadin.  Advised I see in documentation pt is to hold Coumadin starting Sunday.  He is asking when she is scheduled for.  Advised cath has not been scheduled at this point and they will be notified of date and time on Wednesday at pt's appt.  All other instructions will be given at that time as well.   Advised once cath has been scheduled the information will be sent to precert office. Husband is upset about the pt's bill they received since her ED visit stating the insurance company isn't going to pay because of how the hospital billed it.  Advised he will need to call the hospital RE: that billing.

## 2016-05-25 NOTE — Telephone Encounter (Signed)
-----   Message from Eileen Stanford, PA-C sent at 05/24/2016  3:43 PM EDT ----- Thanks Caren Griffins. She doesn't want to bridge. I really appreciate it!  ----- Message ----- From: Warden Fillers, RN Sent: 05/24/2016   3:42 PM To: Eileen Stanford, PA-C    ----- Message ----- From: Midge Minium, MD Sent: 05/24/2016  12:05 PM To: Warden Fillers, RN  Change in plans!  Sarah Mcguire decided she does NOT want to do a Lovenox bridge- per the cardiology note.  She will be holding her coumadin w/ the hopes the her INR is <1.8 by Wednesday so they can proceed w/ cath.  Sorry for the confusion!  Anda Kraft ----- Message ----- From: Eileen Stanford, PA-C Sent: 05/24/2016  11:44 AM To: Midge Minium, MD  Actually, I discussed with the patient and her husband and they would actually rather not do a lovenox bridge. So I have told her to stop her coumadin on Sunday. She has an appointment with your coumadin clinic on Monday and I will see her wed. Hopefully her INR will be below 1.8 and we can get her cath done on Thursday or Friday. If you could let your coumadin clinic know that we are NOT going to bridge.    ----- Message ----- From: Midge Minium, MD Sent: 05/24/2016   7:45 AM To: Eileen Stanford, PA-C  Thank you for reaching out!  This is a difficult case b/c of who she is and her VERY high anxiety.  I reviewed her CT angio from July and she has no evidence of residual PE and I can't find a recent doppler that indicates she does or doesn't have a DVT (but I imagine w/ how long she's been anticoagulated, this would have resolved!).  While I agree, she most likely would be fine off of her anti-coag for cath, I suspect that her very high anxiety and their demanding nature will prevent moving forward unless she is bridged w/ Lovenox.  I hope this helps!  Sarah Nanas, MD ----- Message ----- From: Eileen Stanford, PA-C Sent: 05/23/2016   8:38 PM To: Midge Minium, MD  Dr. Birdie Riddle, my  name is Sarah Mcguire. I am a PA and I work with Dr. Marlou Mcguire. Sarah Mcguire's EF dropped significantly. I discussed her case with Dr. Marlou Mcguire and we feel like we need to do a heart cath to rule out ischemia despite her low risk stress test. She is on coumadin mostly for hx of DVT/PE (2013) but also had isolated PAF after a surgery. We would not bridge her coumadin for cath for afib. SHe said she has previously been bridged off coumadin in the past with Lovenox. Do you think we need to bridge her from a DVT/PE standpoint. She told me the DVT "never resolved."  Just wanted to check with you, thanks.

## 2016-05-25 NOTE — Progress Notes (Signed)
   Subjective:    Patient ID: Sarah Mcguire, female    DOB: June 24, 1941, 75 y.o.   MRN: [id number]  HPI  74 yo  smoker For follow-up of COPD and hypoxia  She has a history of pulmonary embolism in 06/2012 and chronic systolic heart failure She was admitted from 03/2016,when she had an accidental fall and was found to be hypoxic. CT angios and was negative and she was discharged on oxygen.  05/25/2016  Chief Complaint  Patient presents with  . Follow-up    Pt is not able to use Anoro or Breo due to heart condition.   She is in a wheelchair today, on oxygen accompanied by her husband   She was started on [facility] on her last visit- she developed chest pain 3 weeks later and was hospitalized. Husband tells me that Dr. Johnsie Cancel asked her to stop Anoro, but I do not see any documentation in the hospital notes. She was found to have a low EF of 30% and cardiac cath as planned She has only been using albuterol on an as-needed basis  Feels that her breathing is doing better overall she does not want to wear the o2 all the time.  O2 sitting at rest on RA was 92%. Husband does report that on walking saturation drops to 82%. They really want to come off oxygen  Significant tests/ events  Spirometry 03/2016 showed a ratio of 41, FEV1 36%-0.72, FVC of 66% CT angio 03/2016 no PE, emphysema,4.2 cm ascending AA Echo 11/8012 EF 45%      Past Medical History:  Diagnosis Date  . Anemia   . Anxiety    SEVERE  . Arthritis    "probably in my back" (05/08/2016)  . Atrial fibrillation with RVR ([place]) 11/28/2012  . Chronic lower back pain   . Diverticulitis   . DVT (deep venous thrombosis) ([place])   . Emphysema of lung ([place])   . GERD (gastroesophageal reflux disease)   . High cholesterol   . Hyperglycemia   . Hyperlipidemia   . Hypertension   . Leukocytosis   . Neuropathy ([place])   . On home oxygen therapy    "2L; 24/7 prn" (05/08/2016)  . PE (pulmonary embolism)    Bilateral PE, stringy saddle  embolus with predominant proximal lower lobe pulmonary artery involvement 06/26/12  . Type II diabetes mellitus ([facility])      Review of Systems neg for any significant sore throat, dysphagia, itching, sneezing, nasal congestion or excess/ purulent secretions, fever, chills, sweats, unintended wt loss, pleuritic or exertional cp, hempoptysis, orthopnea pnd or change in chronic leg swelling.  Also denies presyncope, palpitations, heartburn, abdominal pain, nausea, vomiting, diarrhea or change in bowel or urinary habits, dysuria,hematuria, rash, arthralgias, visual complaints, headache, numbness weakness or ataxia.     Objective:   Physical Exam  Gen. Pleasant, obese, in no distress ENT - no lesions, no post nasal drip Neck: No JVD, no thyromegaly, no carotid bruits Lungs: no use of accessory muscles, no dullness to percussion, decreased without rales or rhonchi  Cardiovascular: Rhythm regular, heart sounds  normal, no murmurs or gallops, no peripheral edema Musculoskeletal: No deformities, no cyanosis or clubbing , no tremors       Assessment & Plan:

## 2016-05-25 NOTE — Telephone Encounter (Signed)
Returned call to patient.  Spoke with patient's husband about instructions for holding coumadin in preparation for heart catheterization towards the end of next week.

## 2016-05-25 NOTE — Telephone Encounter (Signed)
Pt called and asked that you give her a call back. It has to do with her blood thinners. Thanks.

## 2016-05-25 NOTE — Telephone Encounter (Signed)
Pt would a call back today about her medications and her Heart Cath please.

## 2016-05-25 NOTE — Patient Instructions (Signed)
Our recommendation is to keep oxygen levels above 88% Try to use oxygen during sleep and when walking- okay to stay off oxygen at rest.  Our recommendation for COPD would be to use medication like ANORO or Breo - please check with your heart doctor if this is okay  Can use albuterol 2 puffs as needed for shortness of breath or wheezing  Flu shot is recommended

## 2016-05-25 NOTE — Telephone Encounter (Signed)
Patient's husband calling to state he is not clear on the instructions for wife stopping her warfarin prior to heart cath.  He is requesting a call back with clear instructions on if/when she should stop taking her warfarin.

## 2016-05-25 NOTE — Telephone Encounter (Signed)
Patient notified of PCP recommendations and is agreement and expresses an understanding.  

## 2016-05-28 ENCOUNTER — Telehealth: Payer: Self-pay | Admitting: Family Medicine

## 2016-05-28 ENCOUNTER — Ambulatory Visit (INDEPENDENT_AMBULATORY_CARE_PROVIDER_SITE_OTHER): Payer: Medicare Other | Admitting: General Practice

## 2016-05-28 ENCOUNTER — Ambulatory Visit: Payer: Medicare Other

## 2016-05-28 ENCOUNTER — Other Ambulatory Visit: Payer: Self-pay | Admitting: Family Medicine

## 2016-05-28 DIAGNOSIS — Z5181 Encounter for therapeutic drug level monitoring: Secondary | ICD-10-CM | POA: Diagnosis not present

## 2016-05-28 DIAGNOSIS — I4891 Unspecified atrial fibrillation: Secondary | ICD-10-CM | POA: Diagnosis not present

## 2016-05-28 LAB — POCT INR: INR: 2.3

## 2016-05-28 NOTE — Progress Notes (Signed)
Cardiology Office Note    Date:  05/29/2016   ID:  Sarah Mcguire, DOB [birth year], MRN [id number]  PCP:  Annye Asa, MD  Cardiologist: Dr. Marlou Porch  CC: discuss cath   History of Present Illness:  Sarah Mcguire is a 75 y.o. 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   She had knee replacement a few years ago and had postop atrial fibrillation which was brief, short lived. She had a severe saddle pulmonary embolus in 2013. She also had DVT which did not completely resolve after leg immobilization. Apparently she is on lifelong oral anticoagulation with coumadin. Echocardiogram in 2014 showed her ejection fraction was reduced, 45% range. It is likely that Lasix was started at that time to help prevent signs of heart failure.    She was recently admitted 9/4-05/08/16 for atypical chest pain and significant anxiety. Dr. Johnsie Cancel saw her in consult and ordered a myovue which showed no ischemia or infarct but EF 33% diffuse hypokinesis. (Last evaluation 2014 by echo 40-45%) Clinically, she had no CHF and on ACE. She was anxious to go home and it was decided to discharge her home with outpatient echo and follow up.  Follow up echo 05/21/16 showed worsening of her EF to 25-30%, no RWMAs, mild focal basal hypertrophy of the septum, G1DD with no evidence of elevated filling pressures, mildly reduced RV systolic function,.   I saw her in clinic for post hospital follow up on 05/19/16. She was doing well. I changed her diltiazem CD to Toprol XL. I discussed her case with Dr. Marlou Porch and we both felt like she needed a heart cath to rule out ischemia as the cause of her worsening LV dysfunction. We have been holding her coumadin since Sunday 05/27/16.   INR yesterday 2.3. Today she presents to clinic to discuss heart cath. We did a POC INR which  was 1.8 today. She has been having chest pain everyday that she thinks is related to anxiety. She is very nervous. She and her husband are still very upset about her hospital bills. No LE edema, orthopnea or PND. NO dizziness or syncope.  Past Medical History:  Diagnosis Date  . Anemia   . Anxiety    SEVERE  . Arthritis    "probably in my back" (05/08/2016)  . Atrial fibrillation with RVR ([place]) 11/28/2012  . Chronic lower back pain   . Diverticulitis   . DVT (deep venous thrombosis) ([place])   . Emphysema of lung ([place])   . GERD (gastroesophageal reflux disease)   . High cholesterol   . Hyperglycemia   . Hyperlipidemia   . Hypertension   . Leukocytosis   . Neuropathy ([place])   . On home oxygen therapy    "2L; 24/7 prn" (05/08/2016)  . PE (pulmonary embolism)    Bilateral PE, stringy saddle embolus with predominant proximal lower lobe pulmonary artery involvement 06/26/12  . Type II diabetes mellitus ([place])     Past Surgical History:  Procedure Laterality Date  . CATARACT EXTRACTION W/ INTRAOCULAR LENS  IMPLANT, BILATERAL Bilateral 08/2013  . JOINT REPLACEMENT    . TONSILLECTOMY    . TOTAL KNEE ARTHROPLASTY Left 11/24/2012   Procedure: TOTAL KNEE ARTHROPLASTY;  Surgeon: Kerin Salen, MD;  Location: [facility];  Service: Orthopedics;  Laterality: Left;  . TYMPANOSTOMY TUBE  PLACEMENT Right early 2000s    Current Medications: Outpatient Medications Prior to Visit  Medication Sig Dispense Refill  . acetaminophen (TYLENOL) 325 MG tablet Take 325-650 mg by mouth every 6 (six) hours as needed (for headache).     Marland Kitchen albuterol (PROVENTIL HFA;VENTOLIN HFA) 108 (90 Base) MCG/ACT inhaler Inhale 2 puffs into the lungs every 6 (six) hours as needed for wheezing or shortness of breath. 1 Inhaler 2  . alprazolam (XANAX) 2 MG tablet TAKE 1/2 TO 1 TABLET BY MOUTH 3 TIMES A DAY AS NEEDED FOR SLEEP OR ANXIETY 90 tablet 1  . Cholecalciferol (VITAMIN D) 2000 UNITS tablet Take 2,000 Units by mouth daily.    .  clobetasol cream (TEMOVATE) 0.05 % APPLY EXTERNALLY AT BEDTIME FOR ONE MONTH THEN AS NEEDED FOR ITCHING 60 g 0  . docusate sodium (COLACE) 100 MG capsule Take 100 mg by mouth 2 (two) times daily as needed for mild constipation. Reported on 03/21/2016    . esomeprazole (NEXIUM) 40 MG capsule Take 1 capsule (40 mg total) by mouth daily as needed. For acid reflux. (Patient taking differently: Take 40 mg by mouth daily before breakfast. For acid reflux.) 90 capsule 1  . fenofibrate 160 MG tablet Take 1 tablet (160 mg total) by mouth daily. 90 tablet 1  . folic acid (FOLVITE) 1 MG tablet Take 1 tablet (1 mg total) by mouth daily. 90 tablet 1  . furosemide (LASIX) 40 MG tablet TAKE 1 TABLET BY MOUTH EVERY DAY 90 tablet 1  . glucose blood (ONE TOUCH ULTRA TEST) test strip TEST TWICE A DAY AS DIRECTED 100 each 12  . HYDROcodone-acetaminophen (NORCO/VICODIN) 5-325 MG tablet Take 1-2 tablets by mouth every 4 (four) hours as needed. 90 tablet 0  . levothyroxine (SYNTHROID, LEVOTHROID) 50 MCG tablet Take 1 tablet (50 mcg total) by mouth daily. (Patient taking differently: Take 50 mcg by mouth daily before breakfast. ) 90 tablet 1  . lidocaine (LIDODERM) 5 % Place 1 patch onto the skin daily as needed (for pain.). Remove & Discard patch within 12 hours or as directed by MD    . lidocaine (XYLOCAINE) 5 % ointment APPLY 1 APPLICATION TOPICALLY 3 (THREE) TIMES DAILY AS NEEDED. FOR ITCHING DIRECTLY TO THE SPOT 35.44 g 0  . lisinopril (PRINIVIL,ZESTRIL) 10 MG tablet TAKE 1 TABLET BY MOUTH EVERY DAY 90 tablet 1  . metFORMIN (GLUCOPHAGE-XR) 500 MG 24 hr tablet TAKE 2 TABLETS BY MOUTH IN THE MORNING WITH FOOD AND 1 TABLET IN THE EVENING WITH FOOD. 270 tablet 1  . metoprolol succinate (TOPROL XL) 25 MG 24 hr tablet Take 1 tablet (25 mg total) by mouth daily. 90 tablet 3  . Multiple Vitamin (MULTIVITAMIN) tablet Take 1 tablet by mouth daily.      . simvastatin (ZOCOR) 20 MG tablet TAKE 1 TABLET BY MOUTH EVERY EVENING 90  tablet 1  . nystatin (MYCOSTATIN/NYSTOP) 100000 UNIT/GM POWD Apply to affected area twice daily (Patient taking differently: Apply 1 g topically 2 (two) times daily as needed (for irritation of skin folds). Apply to affected area twice daily) 60 g 3  . warfarin (COUMADIN) 5 MG tablet TAKE 1 TABLET ON TUES, THURS, SATURDAY AND SUNDAY, TAKE 1/2 TAB ON REMAINING DAYS (Patient taking differently: Take 5 mg Tues, Thurs, Sun. TAKE 2.5 mg Mon, Fri, Sat. NO Coumadin on Wed.) 90 tablet 1   No facility-administered medications prior to visit.      Allergies:   Morphine and related; Amoxicillin; Amoxicillin-pot clavulanate; Citalopram  hydrobromide; Clonazepam; Cymbalta [duloxetine hcl]; Macrobid [nitrofurantoin macrocrystal]; Penicillins; Pyridium [phenazopyridine hcl]; Zonegran; Zonisamide; Sertraline hcl; and Wellbutrin [bupropion]   Social History   Social History  . Marital status: Married    Spouse name: N/A  . Number of children: N/A  . Years of education: N/A   Social History Main Topics  . Smoking status: Former Smoker    Packs/day: 0.50    Years: 60.00    Types: Cigarettes    Quit date: 03/18/2016  . Smokeless tobacco: Never Used  . Alcohol use No  . Drug use: No  . Sexual activity: Yes   Other Topics Concern  . Not on file   Social History Narrative  . No narrative on file     Family History:  The patient's *family history includes Diabetes in her brother and mother; Heart disease in her father; Hypertension in her brother and mother.     ROS:   Please see the history of present illness.    ROS  All other systems reviewed and are negative.   PHYSICAL EXAM:   VS:  BP 116/80   Pulse 82   Ht 5\' 3"  (1.6 m)   Wt 162 lb (73.5 kg)   LMP  (LMP Unknown)   SpO2 99% Comment: 2LPM O2 via [place]  BMI 28.70 kg/m    GEN: Well nourished, well developed, in no acute distress  Obese, in wheelchair HEENT: normal  Neck: no JVD, carotid bruits, or masses Cardiac: RRR; no murmurs, rubs, or  gallops,mild LLE edema  Respiratory:  clear to auscultation bilaterally, normal work of breathing GI: soft, nontender, nondistended, + BS MS: no deformity or atrophy  Skin: warm and dry, no rash Neuro:  Alert and Oriented x 3, Strength and sensation are intact Psych: euthymic mood, full affect  Wt Readings from Last 3 Encounters:  05/29/16 162 lb (73.5 kg)  05/25/16 162 lb (73.5 kg)  05/22/16 162 lb 1.9 oz (73.5 kg)      Studies/Labs Reviewed:   EKG:  EKG is NOT ordered today.   Recent Labs: 04/16/2016: TSH 2.47 05/07/2016: ALT 13; B Natriuretic Peptide 234.9 05/17/2016: BUN 25; Creat 0.92; Hemoglobin 11.3; Platelets 290; Potassium 4.5; Sodium 140   Lipid Panel    Component Value Date/Time   CHOL 127 05/07/2016 2012   TRIG 101 05/07/2016 2012   HDL 49 05/07/2016 2012   CHOLHDL 2.6 05/07/2016 2012   VLDL 20 05/07/2016 2012   [place] 58 05/07/2016 2012   LDLDIRECT 75.0 06/06/2009 1042    Additional studies/ records that were reviewed today include:  Myoview 05/08/16 FINDINGS: Perfusion: No decreased activity in the left ventricle on stress imaging to suggest reversible ischemia or infarction. Wall Motion: Global hypokinesis.  No left ventricular dilation. Left Ventricular Ejection Fraction: 33 %  2D ECHO: 05/21/2016 LV EF: 25% -   30% Study Conclusions - Left ventricle: The cavity size was normal. There was mild focal   basal hypertrophy of the septum. Systolic function was severely   reduced. The estimated ejection fraction was in the range of 25%   to 30%. Wall motion was normal; there were no regional wall   motion abnormalities. Doppler parameters are consistent with   abnormal left ventricular relaxation (grade 1 diastolic   dysfunction). There was no evidence of elevated ventricular   filling pressure by Doppler parameters. - Aortic valve: There was trivial regurgitation. - Aortic root: The aortic root was normal in size. - Right ventricle: Systolic function was  mildly  reduced. - Right atrium: The atrium was normal in size. - Tricuspid valve: There was no regurgitation. - Pulmonary arteries: Systolic pressure was within the normal   range. - Inferior vena cava: The vessel was normal in size. - Pericardium, extracardiac: There was no pericardial effusion. Impressions: - Compared to the prior study from 11/28/2012 LVEF has decreased   from 40-45% to 25-30%, RVEF is moderately decreased.   ASSESSMENT & PLAN:   TEKIYAH RIZO is a 75 y.o. 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   Chest pain: she has had some more chest pain since discharge that she thinks is related to anxiety. Recent myoview low risk for ischemia. With drop in EF we need to rule out ischemia. Will also answer question about recurring chest pain.   I have reviewed the risks, indications, and alternatives to cardiac catheterization and possible angioplasty/stenting with the patient. Risks include but are not limited to bleeding, infection, vascular injury, stroke, myocardial infection, arrhythmia, kidney injury, radiation-related injury in the case of prolonged fluoroscopy use, emergency cardiac surgery, and death. The patient understands the risks of serious complication is low (123456).   Chronic systolic CHF: 2D ECHO showed EF down from 40-45%--> 25-30%. Mild RV systolic dysfunction. Appears Euvolemic. Continue lasix and Lisinopril 10mg  daily and Toprol Xl 25mg  daily. Currently holding Coumadin for diagnostic heart cath to rule out ischemia as the cause of her worsening LV dysfunction. INR 1.8 today. Will set up left and right heart cath for tomorrow with Dr. [place]. Orders placed and labs already done.   HTN: BP well controlled on current regimen  HLD: continue statin  Anxiety: this is a major contributor. Very tearful in office today.  Provided reassurance.   Hx of DVT/PE: resume coumadin post cath  PAF: brief post op afib with no recurrence. CHADSVASC of at least 4 on coumadin for DVT/PE   Medication Adjustments/Labs and Tests Ordered: Current medicines are reviewed at length with the patient today.  Concerns regarding medicines are outlined above.  Medication changes, Labs and Tests ordered today are listed in the Patient Instructions below. Patient Instructions  Medication Instructions:  Your physician recommends that you continue on your current medications as directed. Please refer to the Current Medication list given to you today.   Labwork: None ordered  Testing/Procedures: Your physician has requested that you have a cardiac catheterization. Cardiac catheterization is used to diagnose and/or treat various heart conditions. Doctors may recommend this procedure for a number of different reasons. The most common reason is to evaluate chest pain. Chest pain can be a symptom of coronary artery disease (CAD), and cardiac catheterization can show whether plaque is narrowing or blocking your heart's arteries. This procedure is also used to evaluate the valves, as well as measure the blood flow and oxygen levels in different parts of your heart. For further information please visit [email or web address]. Please follow instruction sheet, as given.    Follow-Up: Your physician recommends that you schedule a follow-up appointment in: WILL BE SET UP AT DISCHARGE   Any Other Special Instructions Will Be Listed Below (If Applicable).   Coronary Angiogram A coronary angiogram, also called coronary angiography, is an X-ray procedure used to look at the arteries in the heart. In this procedure, a dye (contrast dye) is injected through a long, hollow tube (catheter). The catheter is about  the size of a piece of cooked spaghetti and is inserted through your groin, wrist, or arm. The dye is injected into each artery, and X-rays are  then taken to show if there is a blockage in the arteries of your heart. LET [facility] CARE PROVIDER KNOW ABOUT:  Any allergies you have, including allergies to shellfish or contrast dye.   All medicines you are taking, including vitamins, herbs, eye drops, creams, and over-the-counter medicines.   Previous problems you or members of your family have had with the use of anesthetics.   Any blood disorders you have.   Previous surgeries you have had.  History of kidney problems or failure.   Other medical conditions you have. RISKS AND COMPLICATIONS  Generally, a coronary angiogram is a safe procedure. However, problems can occur and include:  Allergic reaction to the dye.  Bleeding from the access site or other locations.  Kidney injury, especially in people with impaired kidney function.  Stroke (rare).  Heart attack (rare). BEFORE THE PROCEDURE   Do not eat or drink anything after midnight the night before the procedure or as directed by your health care provider.   Ask your health care provider about changing or stopping your regular medicines. This is especially important if you are taking diabetes medicines or blood thinners. PROCEDURE  You may be given a medicine to help you relax (sedative) before the procedure. This medicine is given through an intravenous (IV) access tube that is inserted into one of your veins.   The area where the catheter will be inserted will be washed and shaved. This is usually done in the groin but may be done in the fold of your arm (near your elbow) or in the wrist.   A medicine will be given to numb the area where the catheter will be inserted (local anesthetic).   The health care provider will insert the catheter into an artery. The catheter will be guided by using a special type of X-ray (fluoroscopy) of the blood vessel being examined.   A special dye will then be injected into the catheter, and X-rays will be taken. The  dye will help to show where any narrowing or blockages are located in the heart arteries.  AFTER THE PROCEDURE   If the procedure is done through the leg, you will be kept in bed lying flat for several hours. You will be instructed to not bend or cross your legs.  The insertion site will be checked frequently.   The pulse in your feet or wrist will be checked frequently.   Additional blood tests, X-rays, and an electrocardiogram may be done.    This information is not intended to replace advice given to you by your health care provider. Make sure you discuss any questions you have with your health care provider.   Document Released: 02/24/2003 Document Revised: 09/10/2014 Document Reviewed: 01/12/2013 Elsevier Interactive Patient Education [employer].    If you need a refill on your cardiac medications before your next appointment, please call your pharmacy.      Signed, Angelena Form, PA-C  05/29/2016 12:45 PM    [facility] [place], [place], [place]  [address] Phone: [phone]; Fax: [phone]

## 2016-05-28 NOTE — Progress Notes (Signed)
I agree with this plan.

## 2016-05-28 NOTE — Telephone Encounter (Signed)
Pt husband was advised that per PCP pt would need to contact cardiology and inform them of her symptoms. If they cannot see pt she needs to go to the ER for evaluation, pt husband stated an understanding.

## 2016-05-28 NOTE — Telephone Encounter (Signed)
Husband called asking for an appt fot [facility] to see wife today for chest pains, when told him he that [facility] did not have anything avail and that he needed to speak with a nurse ([facility]) he said no that pt will not go to ER.

## 2016-05-29 ENCOUNTER — Ambulatory Visit (INDEPENDENT_AMBULATORY_CARE_PROVIDER_SITE_OTHER): Payer: Medicare Other | Admitting: Physician Assistant

## 2016-05-29 ENCOUNTER — Other Ambulatory Visit: Payer: Self-pay | Admitting: Physician Assistant

## 2016-05-29 ENCOUNTER — Encounter: Payer: Self-pay | Admitting: *Deleted

## 2016-05-29 VITALS — BP 116/80 | HR 82 | Ht 63.0 in | Wt 162.0 lb

## 2016-05-29 DIAGNOSIS — R079 Chest pain, unspecified: Secondary | ICD-10-CM | POA: Diagnosis not present

## 2016-05-29 DIAGNOSIS — I429 Cardiomyopathy, unspecified: Secondary | ICD-10-CM | POA: Diagnosis not present

## 2016-05-29 DIAGNOSIS — I5022 Chronic systolic (congestive) heart failure: Secondary | ICD-10-CM

## 2016-05-29 DIAGNOSIS — F419 Anxiety disorder, unspecified: Secondary | ICD-10-CM

## 2016-05-29 DIAGNOSIS — J449 Chronic obstructive pulmonary disease, unspecified: Secondary | ICD-10-CM | POA: Diagnosis not present

## 2016-05-29 DIAGNOSIS — I110 Hypertensive heart disease with heart failure: Secondary | ICD-10-CM

## 2016-05-29 DIAGNOSIS — E785 Hyperlipidemia, unspecified: Secondary | ICD-10-CM

## 2016-05-29 NOTE — Patient Instructions (Signed)
Medication Instructions:  Your physician recommends that you continue on your current medications as directed. Please refer to the Current Medication list given to you today.   Labwork: None ordered  Testing/Procedures: Your physician has requested that you have a cardiac catheterization. Cardiac catheterization is used to diagnose and/or treat various heart conditions. Doctors may recommend this procedure for a number of different reasons. The most common reason is to evaluate chest pain. Chest pain can be a symptom of coronary artery disease (CAD), and cardiac catheterization can show whether plaque is narrowing or blocking your heart's arteries. This procedure is also used to evaluate the valves, as well as measure the blood flow and oxygen levels in different parts of your heart. For further information please visit [email or web address]. Please follow instruction sheet, as given.    Follow-Up: Your physician recommends that you schedule a follow-up appointment in: WILL BE SET UP AT DISCHARGE   Any Other Special Instructions Will Be Listed Below (If Applicable).   Coronary Angiogram A coronary angiogram, also called coronary angiography, is an X-ray procedure used to look at the arteries in the heart. In this procedure, a dye (contrast dye) is injected through a long, hollow tube (catheter). The catheter is about the size of a piece of cooked spaghetti and is inserted through your groin, wrist, or arm. The dye is injected into each artery, and X-rays are then taken to show if there is a blockage in the arteries of your heart. LET [facility] CARE PROVIDER KNOW ABOUT:  Any allergies you have, including allergies to shellfish or contrast dye.   All medicines you are taking, including vitamins, herbs, eye drops, creams, and over-the-counter medicines.   Previous problems you or members of your family have had with the use of anesthetics.   Any blood disorders you have.   Previous  surgeries you have had.  History of kidney problems or failure.   Other medical conditions you have. RISKS AND COMPLICATIONS  Generally, a coronary angiogram is a safe procedure. However, problems can occur and include:  Allergic reaction to the dye.  Bleeding from the access site or other locations.  Kidney injury, especially in people with impaired kidney function.  Stroke (rare).  Heart attack (rare). BEFORE THE PROCEDURE   Do not eat or drink anything after midnight the night before the procedure or as directed by your health care provider.   Ask your health care provider about changing or stopping your regular medicines. This is especially important if you are taking diabetes medicines or blood thinners. PROCEDURE  You may be given a medicine to help you relax (sedative) before the procedure. This medicine is given through an intravenous (IV) access tube that is inserted into one of your veins.   The area where the catheter will be inserted will be washed and shaved. This is usually done in the groin but may be done in the fold of your arm (near your elbow) or in the wrist.   A medicine will be given to numb the area where the catheter will be inserted (local anesthetic).   The health care provider will insert the catheter into an artery. The catheter will be guided by using a special type of X-ray (fluoroscopy) of the blood vessel being examined.   A special dye will then be injected into the catheter, and X-rays will be taken. The dye will help to show where any narrowing or blockages are located in the heart arteries.  AFTER THE PROCEDURE  If the procedure is done through the leg, you will be kept in bed lying flat for several hours. You will be instructed to not bend or cross your legs.  The insertion site will be checked frequently.   The pulse in your feet or wrist will be checked frequently.   Additional blood tests, X-rays, and an electrocardiogram  may be done.    This information is not intended to replace advice given to you by your health care provider. Make sure you discuss any questions you have with your health care provider.   Document Released: 02/24/2003 Document Revised: 09/10/2014 Document Reviewed: 01/12/2013 Elsevier Interactive Patient Education [employer].    If you need a refill on your cardiac medications before your next appointment, please call your pharmacy.

## 2016-05-30 ENCOUNTER — Ambulatory Visit (HOSPITAL_COMMUNITY)
Admission: RE | Admit: 2016-05-30 | Discharge: 2016-05-30 | Disposition: A | Discharge status: Home / Self Care | Payer: Medicare Other | Source: Ambulatory Visit | Attending: Cardiology | Admitting: Cardiology

## 2016-05-30 ENCOUNTER — Ambulatory Visit: Payer: Medicare Other | Admitting: Physician Assistant

## 2016-05-30 ENCOUNTER — Encounter (HOSPITAL_COMMUNITY)
Admission: RE | Disposition: A | Discharge status: Home / Self Care | Payer: Self-pay | Source: Ambulatory Visit | Attending: Cardiology

## 2016-05-30 DIAGNOSIS — E785 Hyperlipidemia, unspecified: Secondary | ICD-10-CM | POA: Diagnosis not present

## 2016-05-30 DIAGNOSIS — I5022 Chronic systolic (congestive) heart failure: Secondary | ICD-10-CM | POA: Diagnosis present

## 2016-05-30 DIAGNOSIS — I429 Cardiomyopathy, unspecified: Secondary | ICD-10-CM

## 2016-05-30 DIAGNOSIS — Z8249 Family history of ischemic heart disease and other diseases of the circulatory system: Secondary | ICD-10-CM | POA: Insufficient documentation

## 2016-05-30 DIAGNOSIS — F419 Anxiety disorder, unspecified: Secondary | ICD-10-CM | POA: Diagnosis not present

## 2016-05-30 DIAGNOSIS — E7800 Pure hypercholesterolemia, unspecified: Secondary | ICD-10-CM | POA: Insufficient documentation

## 2016-05-30 DIAGNOSIS — Z86711 Personal history of pulmonary embolism: Secondary | ICD-10-CM | POA: Diagnosis present

## 2016-05-30 DIAGNOSIS — I420 Dilated cardiomyopathy: Secondary | ICD-10-CM | POA: Diagnosis not present

## 2016-05-30 DIAGNOSIS — Z86718 Personal history of other venous thrombosis and embolism: Secondary | ICD-10-CM | POA: Insufficient documentation

## 2016-05-30 DIAGNOSIS — J449 Chronic obstructive pulmonary disease, unspecified: Secondary | ICD-10-CM | POA: Diagnosis not present

## 2016-05-30 DIAGNOSIS — I2584 Coronary atherosclerosis due to calcified coronary lesion: Secondary | ICD-10-CM | POA: Diagnosis not present

## 2016-05-30 DIAGNOSIS — I480 Paroxysmal atrial fibrillation: Secondary | ICD-10-CM | POA: Insufficient documentation

## 2016-05-30 DIAGNOSIS — D649 Anemia, unspecified: Secondary | ICD-10-CM | POA: Diagnosis not present

## 2016-05-30 DIAGNOSIS — G8929 Other chronic pain: Secondary | ICD-10-CM | POA: Insufficient documentation

## 2016-05-30 DIAGNOSIS — Z7901 Long term (current) use of anticoagulants: Secondary | ICD-10-CM | POA: Insufficient documentation

## 2016-05-30 DIAGNOSIS — Z9981 Dependence on supplemental oxygen: Secondary | ICD-10-CM | POA: Diagnosis not present

## 2016-05-30 DIAGNOSIS — I2510 Atherosclerotic heart disease of native coronary artery without angina pectoris: Secondary | ICD-10-CM | POA: Diagnosis not present

## 2016-05-30 DIAGNOSIS — M545 Low back pain: Secondary | ICD-10-CM | POA: Diagnosis not present

## 2016-05-30 DIAGNOSIS — E1165 Type 2 diabetes mellitus with hyperglycemia: Secondary | ICD-10-CM | POA: Insufficient documentation

## 2016-05-30 DIAGNOSIS — E1140 Type 2 diabetes mellitus with diabetic neuropathy, unspecified: Secondary | ICD-10-CM | POA: Diagnosis present

## 2016-05-30 DIAGNOSIS — E1169 Type 2 diabetes mellitus with other specified complication: Secondary | ICD-10-CM | POA: Diagnosis present

## 2016-05-30 DIAGNOSIS — Z87891 Personal history of nicotine dependence: Secondary | ICD-10-CM | POA: Insufficient documentation

## 2016-05-30 DIAGNOSIS — K219 Gastro-esophageal reflux disease without esophagitis: Secondary | ICD-10-CM | POA: Insufficient documentation

## 2016-05-30 DIAGNOSIS — Z7984 Long term (current) use of oral hypoglycemic drugs: Secondary | ICD-10-CM | POA: Diagnosis not present

## 2016-05-30 DIAGNOSIS — E669 Obesity, unspecified: Secondary | ICD-10-CM | POA: Diagnosis present

## 2016-05-30 DIAGNOSIS — M1990 Unspecified osteoarthritis, unspecified site: Secondary | ICD-10-CM | POA: Insufficient documentation

## 2016-05-30 DIAGNOSIS — I110 Hypertensive heart disease with heart failure: Secondary | ICD-10-CM | POA: Insufficient documentation

## 2016-05-30 DIAGNOSIS — Z96652 Presence of left artificial knee joint: Secondary | ICD-10-CM | POA: Diagnosis not present

## 2016-05-30 DIAGNOSIS — I10 Essential (primary) hypertension: Secondary | ICD-10-CM | POA: Diagnosis present

## 2016-05-30 DIAGNOSIS — Z833 Family history of diabetes mellitus: Secondary | ICD-10-CM | POA: Insufficient documentation

## 2016-05-30 HISTORY — PX: CARDIAC CATHETERIZATION: SHX172

## 2016-05-30 LAB — GLUCOSE, CAPILLARY
Glucose-Capillary: 137 mg/dL — ABNORMAL HIGH (ref 65–99)
Glucose-Capillary: 160 mg/dL — ABNORMAL HIGH (ref 65–99)

## 2016-05-30 LAB — PROTIME-INR
INR: 1.36
Prothrombin Time: 16.9 seconds — ABNORMAL HIGH (ref 11.4–15.2)

## 2016-05-30 SURGERY — LEFT HEART CATH AND CORONARY ANGIOGRAPHY

## 2016-05-30 MED ORDER — SODIUM CHLORIDE 0.9% FLUSH: 3.0000 mL | INTRAVENOUS | Status: DC | PRN

## 2016-05-30 MED ORDER — VERAPAMIL HCL 2.5 MG/ML IV SOLN
INTRAVENOUS | Status: DC | PRN
  Administered 2016-05-30: 10 mL via INTRA_ARTERIAL

## 2016-05-30 MED ORDER — MIDAZOLAM HCL 2 MG/2ML IJ SOLN
INTRAMUSCULAR | Status: AC
  Filled 2016-05-30: qty 2

## 2016-05-30 MED ORDER — LIDOCAINE HCL (PF) 1 % IJ SOLN
INTRAMUSCULAR | Status: AC
  Filled 2016-05-30: qty 30

## 2016-05-30 MED ORDER — DIAZEPAM 5 MG PO TABS
5.0000 mg | ORAL_TABLET | Freq: Once | ORAL | Status: AC
  Administered 2016-05-30: 5 mg via ORAL

## 2016-05-30 MED ORDER — HEPARIN (PORCINE) IN NACL 2-0.9 UNIT/ML-% IJ SOLN
INTRAMUSCULAR | Status: DC | PRN
  Administered 2016-05-30: 500 mL
  Administered 2016-05-30: 1000 mL

## 2016-05-30 MED ORDER — DIAZEPAM 5 MG PO TABS
ORAL_TABLET | ORAL | Status: AC
  Filled 2016-05-30: qty 1

## 2016-05-30 MED ORDER — IOPAMIDOL (ISOVUE-370) INJECTION 76%
INTRAVENOUS | Status: AC
  Filled 2016-05-30: qty 100

## 2016-05-30 MED ORDER — ONDANSETRON HCL 4 MG/2ML IJ SOLN
INTRAMUSCULAR | Status: AC
  Filled 2016-05-30: qty 2

## 2016-05-30 MED ORDER — METFORMIN HCL ER 500 MG PO TB24
1000.0000 mg | ORAL_TABLET | Freq: Every day | ORAL | Status: DC
  Filled 2016-05-30: qty 2

## 2016-05-30 MED ORDER — SODIUM CHLORIDE 0.9 % WEIGHT BASED INFUSION: 3.0000 mL/kg/h | INTRAVENOUS | Status: AC

## 2016-05-30 MED ORDER — HEPARIN (PORCINE) IN NACL 2-0.9 UNIT/ML-% IJ SOLN
INTRAMUSCULAR | Status: AC
Start: 2016-05-30 — End: 2016-05-30
  Filled 2016-05-30: qty 500

## 2016-05-30 MED ORDER — ASPIRIN 81 MG PO CHEW
CHEWABLE_TABLET | ORAL | Status: AC
  Filled 2016-05-30: qty 1

## 2016-05-30 MED ORDER — ASPIRIN 81 MG PO CHEW
81.0000 mg | CHEWABLE_TABLET | ORAL | Status: AC
  Administered 2016-05-30: 81 mg via ORAL

## 2016-05-30 MED ORDER — IOPAMIDOL (ISOVUE-370) INJECTION 76%
INTRAVENOUS | Status: DC | PRN
  Administered 2016-05-30: 70 mL via INTRAVENOUS

## 2016-05-30 MED ORDER — ONDANSETRON HCL 4 MG/2ML IJ SOLN
4.0000 mg | Freq: Once | INTRAMUSCULAR | Status: AC
Start: 2016-05-30 — End: 2016-05-30
  Administered 2016-05-30: 4 mg via INTRAVENOUS

## 2016-05-30 MED ORDER — SODIUM CHLORIDE 0.9% FLUSH: 3.0000 mL | Freq: Two times a day (BID) | INTRAVENOUS | Status: DC

## 2016-05-30 MED ORDER — HEPARIN (PORCINE) IN NACL 2-0.9 UNIT/ML-% IJ SOLN
INTRAMUSCULAR | Status: AC
  Filled 2016-05-30: qty 1000

## 2016-05-30 MED ORDER — SODIUM CHLORIDE 0.9 % IV SOLN: 250.0000 mL | INTRAVENOUS | Status: DC | PRN

## 2016-05-30 MED ORDER — MIDAZOLAM HCL 2 MG/2ML IJ SOLN
INTRAMUSCULAR | Status: DC | PRN
  Administered 2016-05-30: 1 mg via INTRAVENOUS
  Administered 2016-05-30 (×2): 2 mg via INTRAVENOUS

## 2016-05-30 MED ORDER — FENTANYL CITRATE (PF) 100 MCG/2ML IJ SOLN
INTRAMUSCULAR | Status: AC
  Filled 2016-05-30: qty 2

## 2016-05-30 MED ORDER — HEPARIN SODIUM (PORCINE) 1000 UNIT/ML IJ SOLN
INTRAMUSCULAR | Status: DC | PRN
  Administered 2016-05-30: 4000 [IU] via INTRAVENOUS

## 2016-05-30 MED ORDER — HEPARIN SODIUM (PORCINE) 1000 UNIT/ML IJ SOLN
INTRAMUSCULAR | Status: AC
Start: 2016-05-30 — End: 2016-05-30
  Filled 2016-05-30: qty 1

## 2016-05-30 MED ORDER — SODIUM CHLORIDE 0.9 % IV SOLN
INTRAVENOUS | Status: DC
  Administered 2016-05-30: 10:00:00 via INTRAVENOUS

## 2016-05-30 MED ORDER — FENTANYL CITRATE (PF) 100 MCG/2ML IJ SOLN
INTRAMUSCULAR | Status: DC | PRN
  Administered 2016-05-30 (×2): 25 ug via INTRAVENOUS

## 2016-05-30 MED ORDER — VERAPAMIL HCL 2.5 MG/ML IV SOLN
INTRAVENOUS | Status: AC
  Filled 2016-05-30: qty 2

## 2016-05-30 SURGICAL SUPPLY — 10 items
CATH IMPULSE 5F ANG/FL3.5 (CATHETERS) ×2 IMPLANT
DEVICE RAD COMP TR BAND LRG (VASCULAR PRODUCTS) ×2 IMPLANT
GLIDESHEATH SLEND SS 6F .021 (SHEATH) ×2 IMPLANT
KIT HEART LEFT (KITS) ×3 IMPLANT
PACK CARDIAC CATHETERIZATION (CUSTOM PROCEDURE TRAY) ×3 IMPLANT
SYR MEDRAD MARK V 150ML (SYRINGE) ×3 IMPLANT
TRANSDUCER W/STOPCOCK (MISCELLANEOUS) ×6 IMPLANT
TUBING CIL FLEX 10 FLL-RA (TUBING) ×3 IMPLANT
WIRE HI TORQ VERSACORE-J 145CM (WIRE) ×2 IMPLANT
WIRE SAFE-T 1.5MM-J .035X260CM (WIRE) ×2 IMPLANT

## 2016-05-30 NOTE — H&P (View-Only) (Signed)
Cardiology Office Note    Date:  05/29/2016   ID:  [name], DOB 07-12-1941, MRN [id number]  PCP:  Sarah Asa, MD  Cardiologist: Dr. Marlou Porch  CC: discuss cath   History of Present Illness:  Sarah Mcguire is a 75 y.o. 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   She had knee replacement a few years ago and had postop atrial fibrillation which was brief, short lived. She had a severe saddle pulmonary embolus in 2013. She also had DVT which did not completely resolve after leg immobilization. Apparently she is on lifelong oral anticoagulation with coumadin. Echocardiogram in 2014 showed her ejection fraction was reduced, 45% range. It is likely that Lasix was started at that time to help prevent signs of heart failure.    She was recently admitted 9/4-05/08/16 for atypical chest pain and significant anxiety. Dr. Johnsie Cancel saw her in consult and ordered a myovue which showed no ischemia or infarct but EF 33% diffuse hypokinesis. (Last evaluation 2014 by echo 40-45%) Clinically, she had no CHF and on ACE. She was anxious to go home and it was decided to discharge her home with outpatient echo and follow up.  Follow up echo 05/21/16 showed worsening of her EF to 25-30%, no RWMAs, mild focal basal hypertrophy of the septum, G1DD with no evidence of elevated filling pressures, mildly reduced RV systolic function,.   I saw her in clinic for post hospital follow up on 05/19/16. She was doing well. I changed her diltiazem CD to Toprol XL. I discussed her case with Dr. Marlou Porch and we both felt like she needed a heart cath to rule out ischemia as the cause of her worsening LV dysfunction. We have been holding her coumadin since Sunday 05/27/16.   INR yesterday 2.3. Today she presents to clinic to discuss heart cath. We did a POC INR which  was 1.8 today. She has been having chest pain everyday that she thinks is related to anxiety. She is very nervous. She and her husband are still very upset about her hospital bills. No LE edema, orthopnea or PND. NO dizziness or syncope.  Past Medical History:  Diagnosis Date  . Anemia   . Anxiety    SEVERE  . Arthritis    "probably in my back" (05/08/2016)  . Atrial fibrillation with RVR (Sarah Mcguire) 11/28/2012  . Chronic lower back pain   . Diverticulitis   . DVT (deep venous thrombosis) ([place])   . Emphysema of lung ([place])   . GERD (gastroesophageal reflux disease)   . High cholesterol   . Hyperglycemia   . Hyperlipidemia   . Hypertension   . Leukocytosis   . Neuropathy ([place])   . On home oxygen therapy    "2L; 24/7 prn" (05/08/2016)  . PE (pulmonary embolism)    Bilateral PE, stringy saddle embolus with predominant proximal lower lobe pulmonary artery involvement 06/26/12  . Type II diabetes mellitus ([place])     Past Surgical History:  Procedure Laterality Date  . CATARACT EXTRACTION W/ INTRAOCULAR LENS  IMPLANT, BILATERAL Bilateral 08/2013  . JOINT REPLACEMENT    . TONSILLECTOMY    . TOTAL KNEE ARTHROPLASTY Left 11/24/2012   Procedure: TOTAL KNEE ARTHROPLASTY;  Surgeon: Sarah Salen, MD;  Location: [facility];  Service: Orthopedics;  Laterality: Left;  . TYMPANOSTOMY TUBE  PLACEMENT Right early 2000s    Current Medications: Outpatient Medications Prior to Visit  Medication Sig Dispense Refill  . acetaminophen (TYLENOL) 325 MG tablet Take 325-650 mg by mouth every 6 (six) hours as needed (for headache).     Marland Kitchen albuterol (PROVENTIL HFA;VENTOLIN HFA) 108 (90 Base) MCG/ACT inhaler Inhale 2 puffs into the lungs every 6 (six) hours as needed for wheezing or shortness of breath. 1 Inhaler 2  . alprazolam (XANAX) 2 MG tablet TAKE 1/2 TO 1 TABLET BY MOUTH 3 TIMES A DAY AS NEEDED FOR SLEEP OR ANXIETY 90 tablet 1  . Cholecalciferol (VITAMIN D) 2000 UNITS tablet Take 2,000 Units by mouth daily.    .  clobetasol cream (TEMOVATE) 0.05 % APPLY EXTERNALLY AT BEDTIME FOR ONE MONTH THEN AS NEEDED FOR ITCHING 60 g 0  . docusate sodium (COLACE) 100 MG capsule Take 100 mg by mouth 2 (two) times daily as needed for mild constipation. Reported on 03/21/2016    . esomeprazole (NEXIUM) 40 MG capsule Take 1 capsule (40 mg total) by mouth daily as needed. For acid reflux. (Patient taking differently: Take 40 mg by mouth daily before breakfast. For acid reflux.) 90 capsule 1  . fenofibrate 160 MG tablet Take 1 tablet (160 mg total) by mouth daily. 90 tablet 1  . folic acid (FOLVITE) 1 MG tablet Take 1 tablet (1 mg total) by mouth daily. 90 tablet 1  . furosemide (LASIX) 40 MG tablet TAKE 1 TABLET BY MOUTH EVERY DAY 90 tablet 1  . glucose blood (ONE TOUCH ULTRA TEST) test strip TEST TWICE A DAY AS DIRECTED 100 each 12  . HYDROcodone-acetaminophen (NORCO/VICODIN) 5-325 MG tablet Take 1-2 tablets by mouth every 4 (four) hours as needed. 90 tablet 0  . levothyroxine (SYNTHROID, LEVOTHROID) 50 MCG tablet Take 1 tablet (50 mcg total) by mouth daily. (Patient taking differently: Take 50 mcg by mouth daily before breakfast. ) 90 tablet 1  . lidocaine (LIDODERM) 5 % Place 1 patch onto the skin daily as needed (for pain.). Remove & Discard patch within 12 hours or as directed by MD    . lidocaine (XYLOCAINE) 5 % ointment APPLY 1 APPLICATION TOPICALLY 3 (THREE) TIMES DAILY AS NEEDED. FOR ITCHING DIRECTLY TO THE SPOT 35.44 g 0  . lisinopril (PRINIVIL,ZESTRIL) 10 MG tablet TAKE 1 TABLET BY MOUTH EVERY DAY 90 tablet 1  . metFORMIN (GLUCOPHAGE-XR) 500 MG 24 hr tablet TAKE 2 TABLETS BY MOUTH IN THE MORNING WITH FOOD AND 1 TABLET IN THE EVENING WITH FOOD. 270 tablet 1  . metoprolol succinate (TOPROL XL) 25 MG 24 hr tablet Take 1 tablet (25 mg total) by mouth daily. 90 tablet 3  . Multiple Vitamin (MULTIVITAMIN) tablet Take 1 tablet by mouth daily.      . simvastatin (ZOCOR) 20 MG tablet TAKE 1 TABLET BY MOUTH EVERY EVENING 90  tablet 1  . nystatin (MYCOSTATIN/NYSTOP) 100000 UNIT/GM POWD Apply to affected area twice daily (Patient taking differently: Apply 1 g topically 2 (two) times daily as needed (for irritation of skin folds). Apply to affected area twice daily) 60 g 3  . warfarin (COUMADIN) 5 MG tablet TAKE 1 TABLET ON TUES, THURS, SATURDAY AND SUNDAY, TAKE 1/2 TAB ON REMAINING DAYS (Patient taking differently: Take 5 mg Tues, Thurs, Sun. TAKE 2.5 mg Mon, Fri, Sat. NO Coumadin on Wed.) 90 tablet 1   No facility-administered medications prior to visit.      Allergies:   Morphine and related; Amoxicillin; Amoxicillin-pot clavulanate; Citalopram  hydrobromide; Clonazepam; Cymbalta [duloxetine hcl]; Macrobid [nitrofurantoin macrocrystal]; Penicillins; Pyridium [phenazopyridine hcl]; Zonegran; Zonisamide; Sertraline hcl; and Wellbutrin [bupropion]   Social History   Social History  . Marital status: Married    Spouse name: N/A  . Number of children: N/A  . Years of education: N/A   Social History Main Topics  . Smoking status: Former Smoker    Packs/day: 0.50    Years: 60.00    Types: Cigarettes    Quit date: 03/18/2016  . Smokeless tobacco: Never Used  . Alcohol use No  . Drug use: No  . Sexual activity: Yes   Other Topics Concern  . Not on file   Social History Narrative  . No narrative on file     Family History:  The patient's *family history includes Diabetes in her brother and mother; Heart disease in her father; Hypertension in her brother and mother.     ROS:   Please see the history of present illness.    ROS  All other systems reviewed and are negative.   PHYSICAL EXAM:   VS:  BP 116/80   Pulse 82   Ht 5\' 3"  (1.6 m)   Wt 162 lb (73.5 kg)   LMP  (LMP Unknown)   SpO2 99% Comment: 2LPM O2 via [place]  BMI 28.70 kg/m    GEN: Well nourished, well developed, in no acute distress  Obese, in wheelchair HEENT: normal  Neck: no JVD, carotid bruits, or masses Cardiac: RRR; no murmurs, rubs, or  gallops,mild LLE edema  Respiratory:  clear to auscultation bilaterally, normal work of breathing GI: soft, nontender, nondistended, + BS MS: no deformity or atrophy  Skin: warm and dry, no rash Neuro:  Alert and Oriented x 3, Strength and sensation are intact Psych: euthymic mood, full affect  Wt Readings from Last 3 Encounters:  05/29/16 162 lb (73.5 kg)  05/25/16 162 lb (73.5 kg)  05/22/16 162 lb 1.9 oz (73.5 kg)      Studies/Labs Reviewed:   EKG:  EKG is NOT ordered today.   Recent Labs: 04/16/2016: TSH 2.47 05/07/2016: ALT 13; B Natriuretic Peptide 234.9 05/17/2016: BUN 25; Creat 0.92; Hemoglobin 11.3; Platelets 290; Potassium 4.5; Sodium 140   Lipid Panel    Component Value Date/Time   CHOL 127 05/07/2016 2012   TRIG 101 05/07/2016 2012   HDL 49 05/07/2016 2012   CHOLHDL 2.6 05/07/2016 2012   VLDL 20 05/07/2016 2012   [place] 58 05/07/2016 2012   LDLDIRECT 75.0 06/06/2009 1042    Additional studies/ records that were reviewed today include:  Myoview 05/08/16 FINDINGS: Perfusion: No decreased activity in the left ventricle on stress imaging to suggest reversible ischemia or infarction. Wall Motion: Global hypokinesis.  No left ventricular dilation. Left Ventricular Ejection Fraction: 33 %  2D ECHO: 05/21/2016 LV EF: 25% -   30% Study Conclusions - Left ventricle: The cavity size was normal. There was mild focal   basal hypertrophy of the septum. Systolic function was severely   reduced. The estimated ejection fraction was in the range of 25%   to 30%. Wall motion was normal; there were no regional wall   motion abnormalities. Doppler parameters are consistent with   abnormal left ventricular relaxation (grade 1 diastolic   dysfunction). There was no evidence of elevated ventricular   filling pressure by Doppler parameters. - Aortic valve: There was trivial regurgitation. - Aortic root: The aortic root was normal in size. - Right ventricle: Systolic function was  mildly  reduced. - Right atrium: The atrium was normal in size. - Tricuspid valve: There was no regurgitation. - Pulmonary arteries: Systolic pressure was within the normal   range. - Inferior vena cava: The vessel was normal in size. - Pericardium, extracardiac: There was no pericardial effusion. Impressions: - Compared to the prior study from 11/28/2012 LVEF has decreased   from 40-45% to 25-30%, RVEF is moderately decreased.   ASSESSMENT & PLAN:   KIRSTIN HOUSEN is a 75 y.o. 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   Chest pain: she has had some more chest pain since discharge that she thinks is related to anxiety. Recent myoview low risk for ischemia. With drop in EF we need to rule out ischemia. Will also answer question about recurring chest pain.   I have reviewed the risks, indications, and alternatives to cardiac catheterization and possible angioplasty/stenting with the patient. Risks include but are not limited to bleeding, infection, vascular injury, stroke, myocardial infection, arrhythmia, kidney injury, radiation-related injury in the case of prolonged fluoroscopy use, emergency cardiac surgery, and death. The patient understands the risks of serious complication is low (123456).   Chronic systolic CHF: 2D ECHO showed EF down from 40-45%--> 25-30%. Mild RV systolic dysfunction. Appears Euvolemic. Continue lasix and Lisinopril 10mg  daily and Toprol Xl 25mg  daily. Currently holding Coumadin for diagnostic heart cath to rule out ischemia as the cause of her worsening LV dysfunction. INR 1.8 today. Will set up left and right heart cath for tomorrow with Dr. [place]. Orders placed and labs already done.   HTN: BP well controlled on current regimen  HLD: continue statin  Anxiety: this is a major contributor. Very tearful in office today.  Provided reassurance.   Hx of DVT/PE: resume coumadin post cath  PAF: brief post op afib with no recurrence. CHADSVASC of at least 4 on coumadin for DVT/PE   Medication Adjustments/Labs and Tests Ordered: Current medicines are reviewed at length with the patient today.  Concerns regarding medicines are outlined above.  Medication changes, Labs and Tests ordered today are listed in the Patient Instructions below. Patient Instructions  Medication Instructions:  Your physician recommends that you continue on your current medications as directed. Please refer to the Current Medication list given to you today.   Labwork: None ordered  Testing/Procedures: Your physician has requested that you have a cardiac catheterization. Cardiac catheterization is used to diagnose and/or treat various heart conditions. Doctors may recommend this procedure for a number of different reasons. The most common reason is to evaluate chest pain. Chest pain can be a symptom of coronary artery disease (CAD), and cardiac catheterization can show whether plaque is narrowing or blocking your heart's arteries. This procedure is also used to evaluate the valves, as well as measure the blood flow and oxygen levels in different parts of your heart. For further information please visit [email or web address]. Please follow instruction sheet, as given.    Follow-Up: Your physician recommends that you schedule a follow-up appointment in: WILL BE SET UP AT DISCHARGE   Any Other Special Instructions Will Be Listed Below (If Applicable).   Coronary Angiogram A coronary angiogram, also called coronary angiography, is an X-ray procedure used to look at the arteries in the heart. In this procedure, a dye (contrast dye) is injected through a long, hollow tube (catheter). The catheter is about  the size of a piece of cooked spaghetti and is inserted through your groin, wrist, or arm. The dye is injected into each artery, and X-rays are  then taken to show if there is a blockage in the arteries of your heart. LET [facility] CARE PROVIDER KNOW ABOUT:  Any allergies you have, including allergies to shellfish or contrast dye.   All medicines you are taking, including vitamins, herbs, eye drops, creams, and over-the-counter medicines.   Previous problems you or members of your family have had with the use of anesthetics.   Any blood disorders you have.   Previous surgeries you have had.  History of kidney problems or failure.   Other medical conditions you have. RISKS AND COMPLICATIONS  Generally, a coronary angiogram is a safe procedure. However, problems can occur and include:  Allergic reaction to the dye.  Bleeding from the access site or other locations.  Kidney injury, especially in people with impaired kidney function.  Stroke (rare).  Heart attack (rare). BEFORE THE PROCEDURE   Do not eat or drink anything after midnight the night before the procedure or as directed by your health care provider.   Ask your health care provider about changing or stopping your regular medicines. This is especially important if you are taking diabetes medicines or blood thinners. PROCEDURE  You may be given a medicine to help you relax (sedative) before the procedure. This medicine is given through an intravenous (IV) access tube that is inserted into one of your veins.   The area where the catheter will be inserted will be washed and shaved. This is usually done in the groin but may be done in the fold of your arm (near your elbow) or in the wrist.   A medicine will be given to numb the area where the catheter will be inserted (local anesthetic).   The health care provider will insert the catheter into an artery. The catheter will be guided by using a special type of X-ray (fluoroscopy) of the blood vessel being examined.   A special dye will then be injected into the catheter, and X-rays will be taken. The  dye will help to show where any narrowing or blockages are located in the heart arteries.  AFTER THE PROCEDURE   If the procedure is done through the leg, you will be kept in bed lying flat for several hours. You will be instructed to not bend or cross your legs.  The insertion site will be checked frequently.   The pulse in your feet or wrist will be checked frequently.   Additional blood tests, X-rays, and an electrocardiogram may be done.    This information is not intended to replace advice given to you by your health care provider. Make sure you discuss any questions you have with your health care provider.   Document Released: 02/24/2003 Document Revised: 09/10/2014 Document Reviewed: 01/12/2013 Elsevier Interactive Patient Education [employer].    If you need a refill on your cardiac medications before your next appointment, please call your pharmacy.      Signed, Angelena Form, PA-C  05/29/2016 12:45 PM    [facility] [address], [place], [place]  [address] Phone: [phone]; Fax: [phone]

## 2016-05-30 NOTE — Discharge Instructions (Signed)
May resume coumadin tonight at prior dose.  HOLD METFORMIN until Saturday am.Radial Site Care Refer to this sheet in the next few weeks. These instructions provide you with information about caring for yourself after your procedure. Your health care provider may also give you more specific instructions. Your treatment has been planned according to current medical practices, but problems sometimes occur. Call your health care provider if you have any problems or questions after your procedure. WHAT TO EXPECT AFTER THE PROCEDURE After your procedure, it is typical to have the following:  Bruising at the radial site that usually fades within 1-2 weeks.  Blood collecting in the tissue (hematoma) that may be painful to the touch. It should usually decrease in size and tenderness within 1-2 weeks. HOME CARE INSTRUCTIONS  Take medicines only as directed by your health care provider.  You may shower 24-48 hours after the procedure or as directed by your health care provider. Remove the bandage (dressing) and gently wash the site with plain soap and water. Pat the area dry with a clean towel. Do not rub the site, because this may cause bleeding.  Do not take baths, swim, or use a hot tub until your health care provider approves.  Check your insertion site every day for redness, swelling, or drainage.  Do not apply powder or lotion to the site.  Do not flex or bend the affected arm for 24 hours or as directed by your health care provider.  Do not push or pull heavy objects with the affected arm for 24 hours or as directed by your health care provider.  Do not lift over 10 lb (4.5 kg) for 5 days after your procedure or as directed by your health care provider.  Ask your health care provider when it is okay to:  Return to work or school.  Resume usual physical activities or sports.  Resume sexual activity.  Do not drive home if you are discharged the same day as the procedure. Have someone else  drive you.  You may drive 24 hours after the procedure unless otherwise instructed by your health care provider.  Do not operate machinery or power tools for 24 hours after the procedure.  If your procedure was done as an outpatient procedure, which means that you went home the same day as your procedure, a responsible adult should be with you for the first 24 hours after you arrive home.  Keep all follow-up visits as directed by your health care provider. This is important. SEEK MEDICAL CARE IF:  You have a fever.  You have chills.  You have increased bleeding from the radial site. Hold pressure on the site. SEEK IMMEDIATE MEDICAL CARE IF:  You have unusual pain at the radial site.  You have redness, warmth, or swelling at the radial site.  You have drainage (other than a small amount of blood on the dressing) from the radial site.  The radial site is bleeding, and the bleeding does not stop after 30 minutes of holding steady pressure on the site.  Your arm or hand becomes pale, cool, tingly, or numb.   This information is not intended to replace advice given to you by your health care provider. Make sure you discuss any questions you have with your health care provider.   Document Released: 09/22/2010 Document Revised: 09/10/2014 Document Reviewed: 03/08/2014 Elsevier Interactive Patient Education [employer].

## 2016-05-30 NOTE — Progress Notes (Signed)
Erin, PA in to see pt.  Given instructions for Coumadin and Metformin.  Right radial dressing remains clean and dry.  Pts anxiety level has decreased at this time.  Able ambulate in room with assist.

## 2016-05-30 NOTE — Interval H&P Note (Signed)
History and Physical Interval Note:  05/30/2016 10:21 AM  Sarah Mcguire  has presented today for surgery, with the diagnosis of CHEST PAIN  The various methods of treatment have been discussed with the patient and family. After consideration of risks, benefits and other options for treatment, the patient has consented to  Procedure(s): Right/Left Heart Cath and Coronary Angiography (N/A) as a surgical intervention .  The patient's history has been reviewed, patient examined, no change in status, stable for surgery.  I have reviewed the patient's chart and labs.  Questions were answered to the patient's satisfaction.   Cath Lab Visit (complete for each Cath Lab visit)  Clinical Evaluation Leading to the Procedure:   ACS: No.  Non-ACS:    Anginal Classification: CCS II  Anti-ischemic medical therapy: Minimal Therapy (1 class of medications)  Non-Invasive Test Results: High-risk stress test findings: cardiac mortality >3%/year  Prior CABG: No previous CABG      Cath Lab Visit (complete for each Cath Lab visit)  Clinical Evaluation Leading to the Procedure:   ACS: No.  Non-ACS:    Anginal Classification: CCS II  Anti-ischemic medical therapy: Minimal Therapy (1 class of medications)  Non-Invasive Test Results: High-risk stress test findings: cardiac mortality >3%/year  Prior CABG: No previous CABG        Sarah Mcguire [facility] 05/30/2016 10:21 AM

## 2016-05-31 ENCOUNTER — Telehealth: Payer: Self-pay | Admitting: Cardiology

## 2016-05-31 ENCOUNTER — Encounter (HOSPITAL_COMMUNITY): Payer: Self-pay | Admitting: Cardiology

## 2016-05-31 MED FILL — Lidocaine HCl Local Preservative Free (PF) Inj 1%: INTRAMUSCULAR | Qty: 30 | Status: AC

## 2016-05-31 NOTE — Telephone Encounter (Signed)
Pt's husband called to make sure its okay for pt to used the walker today. Pt had a cardiac cath yesterday. Pt had instructions to removed the post cath bandage 24 hours after cath. and to use the walker. Pt is post knee surgery also. pt's husband removed the post cath bandage from her wrist and a small drop of blood came out. Pt's husband reapplied the bandage. The bandage  was removed again a small time later, it was no bleeding. Pt and wife wanted to make sure that it was okay to used the walker now. Husband was reassured that it was fine. It pt start bleeding  to apply a bandage and pressure.  If unable to stop the bleeding. Pt to call the emergency service. Pt and wife verbalized understanding.

## 2016-05-31 NOTE — Telephone Encounter (Signed)
Wants to know can she use her walker and her right today after her procedure on yesterday

## 2016-06-04 ENCOUNTER — Ambulatory Visit (INDEPENDENT_AMBULATORY_CARE_PROVIDER_SITE_OTHER): Payer: Medicare Other | Admitting: Family Medicine

## 2016-06-04 ENCOUNTER — Encounter: Payer: Self-pay | Admitting: Family Medicine

## 2016-06-04 VITALS — BP 124/86 | HR 82 | Temp 98.1°F | Resp 16 | Ht 63.0 in

## 2016-06-04 DIAGNOSIS — F410 Panic disorder [episodic paroxysmal anxiety] without agoraphobia: Secondary | ICD-10-CM | POA: Diagnosis not present

## 2016-06-04 DIAGNOSIS — R8290 Unspecified abnormal findings in urine: Secondary | ICD-10-CM | POA: Diagnosis not present

## 2016-06-04 LAB — POCT URINALYSIS DIPSTICK
Bilirubin, UA: NEGATIVE
Blood, UA: NEGATIVE
Glucose, UA: NEGATIVE
KETONES UA: NEGATIVE
Leukocytes, UA: NEGATIVE
Nitrite, UA: NEGATIVE
PH UA: 7.5
PROTEIN UA: NEGATIVE
SPEC GRAV UA: 1.015
UROBILINOGEN UA: 0.2

## 2016-06-04 MED ORDER — SERTRALINE HCL 25 MG PO TABS: 25.0000 mg | ORAL_TABLET | Freq: Every day | ORAL | 3 refills | Status: AC

## 2016-06-04 MED ORDER — HYDROCODONE-ACETAMINOPHEN 5-325 MG PO TABS: 1.0000 | ORAL_TABLET | ORAL | 0 refills | Status: DC | PRN

## 2016-06-04 NOTE — Assessment & Plan Note (Signed)
Deteriorated.  Pt is having full blown panic in office today.  Reviewed that the results of her cath last week were encouraging and that the CT angio of her chest in July showed no suspicious nodules or lesions (husband reports she is scared of dying from cancer now that her heart is ok).  Explained to her that this type of thinking is not helpful and will elevate her BP, cause her SOB, and CP.  Again recommended psychiatric evaluation- pt and husband declined.  Suggested counseling- declined.  I refused to increase to 5 Xanax daily.  Will restart low dose sertraline and monitor for improvement.  Will follow.

## 2016-06-04 NOTE — Patient Instructions (Signed)
Follow up as needed/scheduled Start the Zoloft (Sertraline) every evening to help control the anxiety Use the Alprazolam as needed for panicked moments All of the news is good news!  Please focus on that! Please consider counseling for your anxiety- it would really help to talk through this Call with any questions or concerns Hang in there!

## 2016-06-04 NOTE — Progress Notes (Signed)
   Subjective:    Patient ID: Sarah Mcguire, female    DOB: 1940/12/11, 75 y.o.   MRN: [id number]  HPI 'help me please'- husband reports 'her nerves are terrible'.  He states they haven't been able to leave the house since her heart cath, 'she won't let me out of her site'.  'i'm scared of dying'.  Pt is worried b/c her BM and urine has 'a different smell'- UA WNL today.  Pt is screaming throughout the visit- hyperventilating.  Just keeps repeating that she's dying and afraid.  Husband is asking to increase Alprazolam to 5 tabs daily (she's on 2mg ).  Pt has had some form of 'intolerance' to all previously tried SSRIs.  Refusing psychiatry or therapy.   Review of Systems For ROS see HPI     Objective:   Physical Exam  Constitutional: She is oriented to person, place, and time. She appears well-developed and well-nourished.  HENT:  Head: Normocephalic and atraumatic.  O2 via [place] in place  Eyes: Conjunctivae and EOM are normal. Pupils are equal, round, and reactive to light.  Cardiovascular: Normal rate, regular rhythm, normal heart sounds and intact distal pulses.   Pulmonary/Chest: Breath sounds normal. She has no wheezes. She has no rales.  Pt is hyperventilating  Musculoskeletal: She exhibits no edema or tenderness.  Neurological: She is alert and oriented to person, place, and time.  Skin: Skin is warm.  Psychiatric:  Very anxious, crying out during visit, yelling, rocking back and forth  Vitals reviewed.         Assessment & Plan:

## 2016-06-04 NOTE — Progress Notes (Signed)
Pre visit review using our clinic review tool, if applicable. No additional management support is needed unless otherwise documented below in the visit note. 

## 2016-06-05 ENCOUNTER — Telehealth: Payer: Self-pay | Admitting: Family Medicine

## 2016-06-05 LAB — URINE CULTURE: ORGANISM ID, BACTERIA: NO GROWTH

## 2016-06-05 NOTE — Telephone Encounter (Signed)
Patient states after taking the medication after supper she slept for about 3 hours then she was up. Per KT advised she can cut the tablet in half. Take 1/2 at supper and 1/2 at bedtime for 1 week then increase to 1 tablet at bedtime to help with sleep and anxiety. Patient is agreeable.

## 2016-06-05 NOTE — Telephone Encounter (Signed)
Pt states that she has some questions about the Rx that she received yesterday and the side effects and wants a call back

## 2016-06-06 ENCOUNTER — Ambulatory Visit (INDEPENDENT_AMBULATORY_CARE_PROVIDER_SITE_OTHER): Payer: Medicare Other | Admitting: General Practice

## 2016-06-06 DIAGNOSIS — I4891 Unspecified atrial fibrillation: Secondary | ICD-10-CM | POA: Diagnosis not present

## 2016-06-06 DIAGNOSIS — Z5181 Encounter for therapeutic drug level monitoring: Secondary | ICD-10-CM

## 2016-06-06 LAB — POCT INR: INR: 3.6

## 2016-06-06 NOTE — Telephone Encounter (Signed)
Pt states that she is not feeling any better and wants to know what to do. Also states that she needs help with her husband, states he can not stay awake.

## 2016-06-06 NOTE — Telephone Encounter (Signed)
Patient notified of PCP recommendations and is agreement and expresses an understanding.  

## 2016-06-06 NOTE — Telephone Encounter (Signed)
It takes 2-4 weeks for medication to get into the system- she will need to be patient. Her husband was fine when he was here 2 days ago.  He is likely exhausted with the stress of what's going on

## 2016-06-07 NOTE — Progress Notes (Signed)
I agree with this plan.

## 2016-06-08 ENCOUNTER — Encounter (HOSPITAL_COMMUNITY): Payer: Self-pay | Admitting: *Deleted

## 2016-06-08 ENCOUNTER — Emergency Department (HOSPITAL_COMMUNITY)
Admission: EM | Admit: 2016-06-08 | Discharge: 2016-06-08 | Disposition: A | Discharge status: Home / Self Care | Payer: Medicare Other | Source: Home / Self Care | Attending: Emergency Medicine | Admitting: Emergency Medicine

## 2016-06-08 DIAGNOSIS — K8689 Other specified diseases of pancreas: Secondary | ICD-10-CM | POA: Diagnosis not present

## 2016-06-08 DIAGNOSIS — I10 Essential (primary) hypertension: Secondary | ICD-10-CM | POA: Diagnosis not present

## 2016-06-08 DIAGNOSIS — R1013 Epigastric pain: Secondary | ICD-10-CM | POA: Insufficient documentation

## 2016-06-08 DIAGNOSIS — Z79899 Other long term (current) drug therapy: Secondary | ICD-10-CM | POA: Insufficient documentation

## 2016-06-08 DIAGNOSIS — I4891 Unspecified atrial fibrillation: Secondary | ICD-10-CM | POA: Diagnosis not present

## 2016-06-08 DIAGNOSIS — E119 Type 2 diabetes mellitus without complications: Secondary | ICD-10-CM | POA: Diagnosis not present

## 2016-06-08 DIAGNOSIS — K8020 Calculus of gallbladder without cholecystitis without obstruction: Secondary | ICD-10-CM | POA: Diagnosis not present

## 2016-06-08 DIAGNOSIS — E039 Hypothyroidism, unspecified: Secondary | ICD-10-CM | POA: Insufficient documentation

## 2016-06-08 DIAGNOSIS — E7800 Pure hypercholesterolemia, unspecified: Secondary | ICD-10-CM | POA: Diagnosis not present

## 2016-06-08 DIAGNOSIS — Z7901 Long term (current) use of anticoagulants: Secondary | ICD-10-CM | POA: Insufficient documentation

## 2016-06-08 DIAGNOSIS — Z9981 Dependence on supplemental oxygen: Secondary | ICD-10-CM | POA: Diagnosis not present

## 2016-06-08 DIAGNOSIS — D509 Iron deficiency anemia, unspecified: Secondary | ICD-10-CM | POA: Diagnosis not present

## 2016-06-08 DIAGNOSIS — I4892 Unspecified atrial flutter: Secondary | ICD-10-CM | POA: Diagnosis not present

## 2016-06-08 DIAGNOSIS — E1140 Type 2 diabetes mellitus with diabetic neuropathy, unspecified: Secondary | ICD-10-CM | POA: Insufficient documentation

## 2016-06-08 DIAGNOSIS — Z96651 Presence of right artificial knee joint: Secondary | ICD-10-CM | POA: Insufficient documentation

## 2016-06-08 DIAGNOSIS — I2510 Atherosclerotic heart disease of native coronary artery without angina pectoris: Secondary | ICD-10-CM | POA: Diagnosis not present

## 2016-06-08 DIAGNOSIS — K219 Gastro-esophageal reflux disease without esophagitis: Secondary | ICD-10-CM | POA: Diagnosis not present

## 2016-06-08 DIAGNOSIS — I5042 Chronic combined systolic (congestive) and diastolic (congestive) heart failure: Secondary | ICD-10-CM | POA: Diagnosis not present

## 2016-06-08 DIAGNOSIS — I959 Hypotension, unspecified: Secondary | ICD-10-CM | POA: Diagnosis not present

## 2016-06-08 DIAGNOSIS — J449 Chronic obstructive pulmonary disease, unspecified: Secondary | ICD-10-CM | POA: Diagnosis not present

## 2016-06-08 DIAGNOSIS — Z87891 Personal history of nicotine dependence: Secondary | ICD-10-CM

## 2016-06-08 DIAGNOSIS — J9611 Chronic respiratory failure with hypoxia: Secondary | ICD-10-CM | POA: Diagnosis not present

## 2016-06-08 DIAGNOSIS — I420 Dilated cardiomyopathy: Secondary | ICD-10-CM | POA: Diagnosis not present

## 2016-06-08 DIAGNOSIS — K5732 Diverticulitis of large intestine without perforation or abscess without bleeding: Secondary | ICD-10-CM | POA: Diagnosis not present

## 2016-06-08 DIAGNOSIS — K869 Disease of pancreas, unspecified: Secondary | ICD-10-CM | POA: Diagnosis not present

## 2016-06-08 DIAGNOSIS — I110 Hypertensive heart disease with heart failure: Secondary | ICD-10-CM | POA: Diagnosis not present

## 2016-06-08 DIAGNOSIS — I5022 Chronic systolic (congestive) heart failure: Secondary | ICD-10-CM | POA: Diagnosis not present

## 2016-06-08 DIAGNOSIS — R339 Retention of urine, unspecified: Secondary | ICD-10-CM | POA: Diagnosis not present

## 2016-06-08 DIAGNOSIS — M545 Low back pain: Secondary | ICD-10-CM | POA: Diagnosis not present

## 2016-06-08 DIAGNOSIS — K2970 Gastritis, unspecified, without bleeding: Secondary | ICD-10-CM | POA: Diagnosis not present

## 2016-06-08 DIAGNOSIS — R079 Chest pain, unspecified: Secondary | ICD-10-CM | POA: Diagnosis not present

## 2016-06-08 DIAGNOSIS — G8929 Other chronic pain: Secondary | ICD-10-CM | POA: Diagnosis not present

## 2016-06-08 DIAGNOSIS — H8020 Cochlear otosclerosis, unspecified ear: Secondary | ICD-10-CM | POA: Diagnosis not present

## 2016-06-08 DIAGNOSIS — R1031 Right lower quadrant pain: Secondary | ICD-10-CM | POA: Diagnosis not present

## 2016-06-08 DIAGNOSIS — K5792 Diverticulitis of intestine, part unspecified, without perforation or abscess without bleeding: Secondary | ICD-10-CM | POA: Diagnosis not present

## 2016-06-08 DIAGNOSIS — R10817 Generalized abdominal tenderness: Secondary | ICD-10-CM | POA: Diagnosis not present

## 2016-06-08 LAB — I-STAT TROPONIN, ED: TROPONIN I, POC: 0.01 ng/mL (ref 0.00–0.08)

## 2016-06-08 LAB — URINALYSIS, ROUTINE W REFLEX MICROSCOPIC
Bilirubin Urine: NEGATIVE
Glucose, UA: NEGATIVE mg/dL
Hgb urine dipstick: NEGATIVE
KETONES UR: NEGATIVE mg/dL
NITRITE: NEGATIVE
PH: 8 (ref 5.0–8.0)
PROTEIN: NEGATIVE mg/dL
Specific Gravity, Urine: 1.008 (ref 1.005–1.030)

## 2016-06-08 LAB — URINE MICROSCOPIC-ADD ON
Bacteria, UA: NONE SEEN
RBC / HPF: NONE SEEN RBC/hpf (ref 0–5)

## 2016-06-08 LAB — COMPREHENSIVE METABOLIC PANEL
ALBUMIN: 4.2 g/dL (ref 3.5–5.0)
ALT: 13 U/L — ABNORMAL LOW (ref 14–54)
ANION GAP: 11 (ref 5–15)
AST: 20 U/L (ref 15–41)
Alkaline Phosphatase: 48 U/L (ref 38–126)
BILIRUBIN TOTAL: 0.5 mg/dL (ref 0.3–1.2)
BUN: 11 mg/dL (ref 6–20)
CHLORIDE: 103 mmol/L (ref 101–111)
CO2: 27 mmol/L (ref 22–32)
Calcium: 10.6 mg/dL — ABNORMAL HIGH (ref 8.9–10.3)
Creatinine, Ser: 0.71 mg/dL (ref 0.44–1.00)
GFR calc Af Amer: >60 mL/min (ref >60)
GFR calc non Af Amer: >60 mL/min (ref >60)
GLUCOSE: 146 mg/dL — AB (ref 65–99)
POTASSIUM: 3.7 mmol/L (ref 3.5–5.1)
SODIUM: 141 mmol/L (ref 135–145)
TOTAL PROTEIN: 6.7 g/dL (ref 6.5–8.1)

## 2016-06-08 LAB — CBC WITH DIFFERENTIAL/PLATELET
BASOS ABS: 0 10*3/uL (ref 0.0–0.1)
BASOS PCT: 0 %
EOS ABS: 0.1 10*3/uL (ref 0.0–0.7)
Eosinophils Relative: 1 %
HCT: 35.6 % — ABNORMAL LOW (ref 36.0–46.0)
Hemoglobin: 11.2 g/dL — ABNORMAL LOW (ref 12.0–15.0)
Lymphocytes Relative: 22 %
Lymphs Abs: 2.1 10*3/uL (ref 0.7–4.0)
MCH: 26.9 pg (ref 26.0–34.0)
MCHC: 31.5 g/dL (ref 30.0–36.0)
MCV: 85.6 fL (ref 78.0–100.0)
MONO ABS: 0.8 10*3/uL (ref 0.1–1.0)
MONOS PCT: 9 %
Neutro Abs: 6.6 10*3/uL (ref 1.7–7.7)
Neutrophils Relative %: 68 %
PLATELETS: 285 10*3/uL (ref 150–400)
RBC: 4.16 MIL/uL (ref 3.87–5.11)
RDW: 17 % — AB (ref 11.5–15.5)
WBC: 9.6 10*3/uL (ref 4.0–10.5)

## 2016-06-08 LAB — LIPASE, BLOOD: Lipase: 21 U/L (ref 11–51)

## 2016-06-08 LAB — I-STAT CG4 LACTIC ACID, ED: LACTIC ACID, VENOUS: 1.39 mmol/L (ref 0.5–1.9)

## 2016-06-08 MED ORDER — RANITIDINE HCL 150 MG PO TABS: 150.0000 mg | ORAL_TABLET | Freq: Two times a day (BID) | ORAL | 0 refills | Status: DC | PRN

## 2016-06-08 MED ORDER — GI COCKTAIL ~~LOC~~
30.0000 mL | Freq: Once | ORAL | Status: AC
  Administered 2016-06-08: 30 mL via ORAL
  Filled 2016-06-08: qty 30

## 2016-06-08 MED ORDER — ONDANSETRON HCL 4 MG/2ML IJ SOLN
INTRAMUSCULAR | Status: AC
  Filled 2016-06-08: qty 2

## 2016-06-08 MED ORDER — RANITIDINE HCL 150 MG/10ML PO SYRP
150.0000 mg | ORAL_SOLUTION | Freq: Once | ORAL | Status: AC
  Administered 2016-06-08: 150 mg via ORAL
  Filled 2016-06-08: qty 10

## 2016-06-08 MED ORDER — ALPRAZOLAM 0.25 MG PO TABS
2.0000 mg | ORAL_TABLET | Freq: Once | ORAL | Status: AC
  Administered 2016-06-08: 2 mg via ORAL
  Filled 2016-06-08: qty 8

## 2016-06-08 NOTE — ED Triage Notes (Signed)
Pt arrived by [facility] for abd pain for over a month, pt repeatedly saying "that she is going to die and needs to see her husband."

## 2016-06-08 NOTE — ED Provider Notes (Signed)
[facility] DEPT Provider Note   CSN: [id number] Arrival date & time: 06/08/16  0708     History   Chief Complaint Chief Complaint  Patient presents with  . Abdominal Pain    HPI Sarah Mcguire is a 75 y.o. female.  The history is provided by the patient.  Abdominal Pain   This is a recurrent problem. The current episode started 1 to 2 hours ago. The problem occurs constantly. The problem has been gradually worsening. The pain is associated with eating (Severe anxiety). The pain is located in the epigastric region and RLQ. The quality of the pain is aching and sharp. The pain is moderate. Pertinent negatives include fever, belching, diarrhea, nausea and vomiting. Nothing aggravates the symptoms. Nothing relieves the symptoms. Past workup does not include surgery. Her past medical history is significant for GERD.    Past Medical History:  Diagnosis Date  . Anemia   . Anxiety    SEVERE  . Arthritis    "probably in my back" (05/08/2016)  . Atrial fibrillation with RVR ([place]) 11/28/2012  . Chronic lower back pain   . Diverticulitis   . DVT (deep venous thrombosis) ([place])   . Emphysema of lung ([place])   . GERD (gastroesophageal reflux disease)   . High cholesterol   . Hyperglycemia   . Hyperlipidemia   . Hypertension   . Leukocytosis   . Neuropathy ([place])   . On home oxygen therapy    "2L; 24/7 prn" (05/08/2016)  . PE (pulmonary embolism)    Bilateral PE, stringy saddle embolus with predominant proximal lower lobe pulmonary artery involvement 06/26/12  . Type II diabetes mellitus [facility])     Patient Active Problem List   Diagnosis Date Noted  . Chronic systolic CHF (congestive heart failure) ([place]) 05/30/2016  . COPD (chronic obstructive pulmonary disease) ([place]) 04/02/2016  . Chronic hypoxemic respiratory failure ([place]) 03/19/2016  . Hypothyroidism 09/14/2015  . Borderline low O2 saturation 05/24/2015  . Premature atrial contraction 10/21/2014  . Abnormal EKG 10/21/2014  .  History of pulmonary embolism 10/21/2014  . Atrial flutter ([place]) 08/30/2014  . Right hip pain 08/01/2014  . Atypical chest pain 04/22/2014  . Vaginitis and vulvovaginitis 03/22/2014  . UTI symptoms 03/22/2014  . Knee pain 01/11/2014  . External hemorrhoid, bleeding 11/02/2013  . Encounter for therapeutic drug monitoring 10/15/2013  . Cataract of both eyes 08/03/2013  . Right groin pain 03/31/2013  . Insomnia 12/09/2012  . Atrial fibrillation with RVR ([place]) 11/28/2012  . Osteoarthritis of left knee 11/26/2012  . Leg pain 11/03/2012  . Left knee pain 10/31/2012  . Long term (current) use of anticoagulants 07/16/2012  . PE (pulmonary embolism) 06/30/2012  . GERD (gastroesophageal reflux disease) 06/26/2012  . Anxiety 06/26/2012  . DJD (degenerative joint disease) 06/26/2012  . Palpitations 06/25/2012  . Chest pain 02/13/2012  . Fatigue 02/01/2012  . Panic attack 11/13/2011  . General medical examination 07/21/2011  . Osteoporosis 07/09/2011  . Right sided abdominal pain 03/26/2011  . Plantar fasciitis 03/13/2011  . Diverticulitis 12/13/2010  . DYSURIA 10/24/2010  . RHINITIS 10/02/2010  . CARDIOMYOPATHY, DILATED 08/01/2010  . OBESITY 05/05/2010  . HIP PAIN 02/28/2010  . PLANTAR FASCIITIS, RIGHT 02/24/2010  . EDEMA- LOCALIZED 02/20/2010  . PORTAL VEIN THROMBOSIS 01/23/2010  . HEART FAILURE, CONGESTIVE UNSPEC 01/09/2010  . CANDIDIASIS OF MOUTH 01/02/2010  . ANEMIA 01/02/2010  . Leukocytosis 01/02/2010  . THROMBOCYTOSIS 01/02/2010  . GERD 01/02/2010  . VAGINITIS, ATROPHIC 12/05/2009  . BACK PAIN,  CHRONIC 11/08/2009  . Diabetes mellitus with neuropathy ([place]) 11/07/2009  . SMOKER 08/31/2009  . UTI 06/13/2009  . VAGINITIS 06/06/2009  . EUSTACHIAN TUBE DYSFUNCTION 03/28/2009  . HEART MURMUR, SYSTOLIC 0000000  . SINUSITIS- ACUTE-NOS 11/01/2008  . DIVERTICULITIS OF COLON 09/06/2008  . Hyperlipidemia associated with type 2 diabetes mellitus ([place]) 07/30/2008  . Anxiety and  depression 07/30/2008  . Essential hypertension 07/30/2008    Past Surgical History:  Procedure Laterality Date  . CARDIAC CATHETERIZATION N/A 05/30/2016   Procedure: Left Heart Cath and Coronary Angiography;  Surgeon: Peter M Martinique, MD;  Location: [facility];  Service: Cardiovascular;  Laterality: N/A;  . CATARACT EXTRACTION W/ INTRAOCULAR LENS  IMPLANT, BILATERAL Bilateral 08/2013  . JOINT REPLACEMENT    . TONSILLECTOMY    . TOTAL KNEE ARTHROPLASTY Left 11/24/2012   Procedure: TOTAL KNEE ARTHROPLASTY;  Surgeon: Kerin Salen, MD;  Location: [facility];  Service: Orthopedics;  Laterality: Left;  . TYMPANOSTOMY TUBE PLACEMENT Right early 2000s    OB History    Gravida Para Term Preterm AB Living   2 2 2     2    SAB TAB Ectopic Multiple Live Births                   Home Medications    Prior to Admission medications   Medication Sig Start Date End Date Taking? Authorizing Provider  acetaminophen (TYLENOL) 325 MG tablet Take 325-650 mg by mouth every 6 (six) hours as needed (for headache).     Historical Provider, MD  albuterol (PROVENTIL HFA;VENTOLIN HFA) 108 (90 Base) MCG/ACT inhaler Inhale 2 puffs into the lungs every 6 (six) hours as needed for wheezing or shortness of breath. 04/17/16   Tammy S Parrett, NP  alprazolam (XANAX) 2 MG tablet TAKE 1/2 TO 1 TABLET BY MOUTH 3 TIMES A DAY AS NEEDED FOR SLEEP OR ANXIETY Patient taking differently: TAKE 1-2 mg BY MOUTH 3 TIMES A DAY AS NEEDED FOR SLEEP OR ANXIETY 05/11/16   Midge Minium, MD  Cholecalciferol (VITAMIN D) 2000 UNITS tablet Take 2,000 Units by mouth daily.    Historical Provider, MD  clobetasol cream (TEMOVATE) 0.05 % APPLY EXTERNALLY AT BEDTIME FOR ONE MONTH THEN AS NEEDED FOR ITCHING 02/24/16   Anastasio Auerbach, MD  docusate sodium (COLACE) 100 MG capsule Take 100 mg by mouth 2 (two) times daily as needed for mild constipation. Reported on 03/21/2016    Historical Provider, MD  esomeprazole (NEXIUM) 40 MG capsule Take  1 capsule (40 mg total) by mouth daily as needed. For acid reflux. Patient taking differently: Take 40 mg by mouth daily before breakfast. For acid reflux. 05/24/15   Midge Minium, MD  fenofibrate 160 MG tablet Take 1 tablet (160 mg total) by mouth daily. 03/20/16   Midge Minium, MD  folic acid (FOLVITE) 1 MG tablet Take 1 tablet (1 mg total) by mouth daily. 05/31/14   Midge Minium, MD  furosemide (LASIX) 40 MG tablet TAKE 1 TABLET BY MOUTH EVERY DAY Patient taking differently: Take 20 mg by mouth every day 04/23/16   Midge Minium, MD  glucose blood (ONE TOUCH ULTRA TEST) test strip TEST TWICE A DAY AS DIRECTED 12/19/15   Midge Minium, MD  HYDROcodone-acetaminophen (NORCO/VICODIN) 5-325 MG tablet Take 1-2 tablets by mouth every 4 (four) hours as needed. 06/04/16   Midge Minium, MD  levothyroxine (SYNTHROID, LEVOTHROID) 50 MCG tablet Take 1 tablet (50 mcg total) by  mouth daily. Patient taking differently: Take 50 mcg by mouth daily before breakfast.  12/19/15   Midge Minium, MD  lidocaine (LIDODERM) 5 % Place 1 patch onto the skin daily as needed (for pain.). Remove & Discard patch within 12 hours or as directed by MD    Historical Provider, MD  lidocaine (XYLOCAINE) 5 % ointment APPLY 1 APPLICATION TOPICALLY 3 (THREE) TIMES DAILY AS NEEDED. FOR ITCHING DIRECTLY TO THE SPOT 02/24/16   Anastasio Auerbach, MD  lisinopril (PRINIVIL,ZESTRIL) 10 MG tablet TAKE 1 TABLET BY MOUTH EVERY DAY Patient taking differently: Take 10 mg by mouth every day 05/28/16   Midge Minium, MD  metoprolol succinate (TOPROL XL) 25 MG 24 hr tablet Take 1 tablet (25 mg total) by mouth daily. 05/22/16   Eileen Stanford, PA-C  Multiple Vitamin (MULTIVITAMIN) tablet Take 1 tablet by mouth daily.      Historical Provider, MD  nystatin (MYCOSTATIN/NYSTOP) powder Apply 1 g topically 2 (two) times daily as needed (skin irritation).    Historical Provider, MD  sertraline (ZOLOFT) 25 MG tablet Take  1 tablet (25 mg total) by mouth at bedtime. 06/04/16   Midge Minium, MD  simvastatin (ZOCOR) 20 MG tablet TAKE 1 TABLET BY MOUTH EVERY EVENING Patient taking differently: Take 20 mg by mouth every evening 05/28/16   Midge Minium, MD    Family History Family History  Problem Relation Age of Onset  . Diabetes Mother   . Hypertension Mother   . Heart disease Father     MI age 46's   . Diabetes Brother   . Hypertension Brother     Social History Social History  Substance Use Topics  . Smoking status: Former Smoker    Packs/day: 0.50    Years: 60.00    Types: Cigarettes    Quit date: 03/18/2016  . Smokeless tobacco: Never Used  . Alcohol use No     Allergies   Morphine and related; Cymbalta [duloxetine hcl]; Amoxicillin; Amoxicillin-pot clavulanate; Citalopram hydrobromide; Clonazepam; Macrobid [nitrofurantoin macrocrystal]; Penicillins; Pyridium [phenazopyridine hcl]; Sertraline hcl; Wellbutrin [bupropion]; Zonegran; and Zonisamide   Review of Systems Review of Systems  Constitutional: Negative for fever.  Gastrointestinal: Positive for abdominal pain. Negative for diarrhea, nausea and vomiting.  All other systems reviewed and are negative.    Physical Exam Updated Vital Signs BP 123/85 (BP Location: Left Arm)   Pulse 86   Temp 97.5 F (36.4 C) (Axillary)   Resp 24   LMP  (LMP Unknown)   SpO2 98%   Physical Exam  Constitutional: She is oriented to person, place, and time. She appears well-developed and well-nourished. No distress.  HENT:  Head: Normocephalic.  Nose: Nose normal.  Eyes: Conjunctivae are normal.  Neck: Neck supple. No tracheal deviation present.  Cardiovascular: Normal rate, regular rhythm and normal heart sounds.   Pulmonary/Chest: Effort normal and breath sounds normal. No respiratory distress.  Abdominal: Soft. She exhibits no distension. There is no tenderness. There is no rebound and no guarding.  Neurological: She is alert and  oriented to person, place, and time.  Skin: Skin is warm and dry. Capillary refill takes less than 2 seconds.  Psychiatric: Her mood appears anxious (Severe with tremulousness, fear of anyone leaving the room who comes in to examine her, and extreme concern about her husbands state of health asking for him to be evaluated although he appears well).  Repetitively questioning the activity of staff who entered the room  ED Treatments / Results  Labs (all labs ordered are listed, but only abnormal results are displayed) Labs Reviewed  CBC WITH DIFFERENTIAL/PLATELET - Abnormal; Notable for the following:       Result Value   Hemoglobin 11.2 (*)    HCT 35.6 (*)    RDW 17.0 (*)    All other components within normal limits  COMPREHENSIVE METABOLIC PANEL - Abnormal; Notable for the following:    Glucose, Bld 146 (*)    Calcium 10.6 (*)    ALT 13 (*)    All other components within normal limits  URINALYSIS, ROUTINE W REFLEX MICROSCOPIC (NOT AT [facility]) - Abnormal; Notable for the following:    Leukocytes, UA SMALL (*)    All other components within normal limits  URINE MICROSCOPIC-ADD ON - Abnormal; Notable for the following:    Squamous Epithelial / LPF 0-5 (*)    All other components within normal limits  LIPASE, BLOOD  I-STAT TROPOININ, ED  I-STAT CG4 LACTIC ACID, ED    EKG  EKG Interpretation None       Radiology No results found.  Procedures Procedures (including critical care time)  Medications Ordered in ED Medications  ALPRAZolam (XANAX) tablet 2 mg (2 mg Oral Given 06/08/16 0752)  gi cocktail (Maalox,Lidocaine,Donnatal) (30 mLs Oral Given 06/08/16 0910)  ranitidine (ZANTAC) 150 MG/10ML syrup 150 mg (150 mg Oral Given 06/08/16 1114)     Initial Impression / Assessment and Plan / ED Course  I have reviewed the triage vital signs and the nursing notes.  Pertinent labs & imaging results that were available during my care of the patient were reviewed by me and  considered in my medical decision making (see chart for details).  Clinical Course   75 y.o. female presents with Severe anxiety and epigastric abdominal pain that she has had intermittently for the last 2 weeks. She states that symptoms have been ongoing since her recent cardiac catheterization. She noticed a small amount of right lower abdominal pain earlier today. She is afebrile, vital signs are stable, no tenderness elicited on exam the patient is tremulous with anxiety, is insistent on holding hands with providers and has a fear of anyone leaving the room. She was given her 2 mg home dose of Xanax with complete resolution of symptoms. She was given GI cocktail for what is possibly a mild gastritis as lab workup is unrevealing and exam is reassuring for lack of biliary pathology, diverticulitis or appendicitis. I recommended supplementation with H2 blockers to help with her ongoing symptoms. Plan to follow up with PCP as needed and return precautions discussed for worsening or new concerning symptoms.   Final Clinical Impressions(s) / ED Diagnoses   Final diagnoses:  Epigastric pain    New Prescriptions New Prescriptions   No medications on file     Leo Grosser, MD 06/09/16 [phone]

## 2016-06-08 NOTE — ED Notes (Signed)
Pt placed on bedpan and unable to give sample at this time due to being so anxious.

## 2016-06-08 NOTE — ED Notes (Signed)
Pt is resting, feeling more relaxed. Husband at bedside, has come off bedpan. Door is open.

## 2016-06-08 NOTE — ED Notes (Addendum)
Pt appears very anxious, is claustrophobic, wants door open. Pt husband at bedside, pt is very anxious. Does not want her IV started at this time.

## 2016-06-08 NOTE — ED Notes (Signed)
Unable to obtain oral temp due to pt having a nervous break down because everyone left the room and she is afraid to be alone.

## 2016-06-09 ENCOUNTER — Emergency Department (HOSPITAL_COMMUNITY): Payer: Medicare Other

## 2016-06-09 ENCOUNTER — Encounter (HOSPITAL_COMMUNITY): Payer: Self-pay | Admitting: Emergency Medicine

## 2016-06-09 ENCOUNTER — Inpatient Hospital Stay (HOSPITAL_COMMUNITY)
Admission: EM | Admit: 2016-06-09 | Discharge: 2016-06-13 | DRG: 392 | Disposition: A | Discharge status: Home / Self Care | Payer: Medicare Other | Attending: Internal Medicine | Admitting: Internal Medicine

## 2016-06-09 DIAGNOSIS — E0840 Diabetes mellitus due to underlying condition with diabetic neuropathy, unspecified: Secondary | ICD-10-CM

## 2016-06-09 DIAGNOSIS — D509 Iron deficiency anemia, unspecified: Secondary | ICD-10-CM | POA: Diagnosis present

## 2016-06-09 DIAGNOSIS — N39490 Overflow incontinence: Secondary | ICD-10-CM | POA: Diagnosis present

## 2016-06-09 DIAGNOSIS — F419 Anxiety disorder, unspecified: Secondary | ICD-10-CM | POA: Diagnosis present

## 2016-06-09 DIAGNOSIS — Z961 Presence of intraocular lens: Secondary | ICD-10-CM | POA: Diagnosis present

## 2016-06-09 DIAGNOSIS — Z86718 Personal history of other venous thrombosis and embolism: Secondary | ICD-10-CM

## 2016-06-09 DIAGNOSIS — K8020 Calculus of gallbladder without cholecystitis without obstruction: Secondary | ICD-10-CM | POA: Diagnosis present

## 2016-06-09 DIAGNOSIS — J449 Chronic obstructive pulmonary disease, unspecified: Secondary | ICD-10-CM | POA: Diagnosis present

## 2016-06-09 DIAGNOSIS — Z880 Allergy status to penicillin: Secondary | ICD-10-CM

## 2016-06-09 DIAGNOSIS — E1140 Type 2 diabetes mellitus with diabetic neuropathy, unspecified: Secondary | ICD-10-CM | POA: Diagnosis present

## 2016-06-09 DIAGNOSIS — I4891 Unspecified atrial fibrillation: Secondary | ICD-10-CM | POA: Diagnosis present

## 2016-06-09 DIAGNOSIS — Z79899 Other long term (current) drug therapy: Secondary | ICD-10-CM

## 2016-06-09 DIAGNOSIS — Z7901 Long term (current) use of anticoagulants: Secondary | ICD-10-CM

## 2016-06-09 DIAGNOSIS — Z87891 Personal history of nicotine dependence: Secondary | ICD-10-CM

## 2016-06-09 DIAGNOSIS — Z87898 Personal history of other specified conditions: Secondary | ICD-10-CM

## 2016-06-09 DIAGNOSIS — K5792 Diverticulitis of intestine, part unspecified, without perforation or abscess without bleeding: Secondary | ICD-10-CM | POA: Diagnosis present

## 2016-06-09 DIAGNOSIS — Z885 Allergy status to narcotic agent status: Secondary | ICD-10-CM

## 2016-06-09 DIAGNOSIS — I110 Hypertensive heart disease with heart failure: Secondary | ICD-10-CM | POA: Diagnosis present

## 2016-06-09 DIAGNOSIS — E7800 Pure hypercholesterolemia, unspecified: Secondary | ICD-10-CM | POA: Diagnosis present

## 2016-06-09 DIAGNOSIS — E1169 Type 2 diabetes mellitus with other specified complication: Secondary | ICD-10-CM

## 2016-06-09 DIAGNOSIS — I5042 Chronic combined systolic (congestive) and diastolic (congestive) heart failure: Secondary | ICD-10-CM | POA: Diagnosis present

## 2016-06-09 DIAGNOSIS — Z9981 Dependence on supplemental oxygen: Secondary | ICD-10-CM

## 2016-06-09 DIAGNOSIS — G8929 Other chronic pain: Secondary | ICD-10-CM | POA: Diagnosis present

## 2016-06-09 DIAGNOSIS — Z7984 Long term (current) use of oral hypoglycemic drugs: Secondary | ICD-10-CM

## 2016-06-09 DIAGNOSIS — E039 Hypothyroidism, unspecified: Secondary | ICD-10-CM | POA: Diagnosis present

## 2016-06-09 DIAGNOSIS — Z888 Allergy status to other drugs, medicaments and biological substances status: Secondary | ICD-10-CM

## 2016-06-09 DIAGNOSIS — I2510 Atherosclerotic heart disease of native coronary artery without angina pectoris: Secondary | ICD-10-CM | POA: Diagnosis present

## 2016-06-09 DIAGNOSIS — Z9841 Cataract extraction status, right eye: Secondary | ICD-10-CM

## 2016-06-09 DIAGNOSIS — Z833 Family history of diabetes mellitus: Secondary | ICD-10-CM

## 2016-06-09 DIAGNOSIS — F329 Major depressive disorder, single episode, unspecified: Secondary | ICD-10-CM | POA: Diagnosis present

## 2016-06-09 DIAGNOSIS — R339 Retention of urine, unspecified: Secondary | ICD-10-CM | POA: Diagnosis present

## 2016-06-09 DIAGNOSIS — J9611 Chronic respiratory failure with hypoxia: Secondary | ICD-10-CM | POA: Diagnosis present

## 2016-06-09 DIAGNOSIS — Z96652 Presence of left artificial knee joint: Secondary | ICD-10-CM | POA: Diagnosis present

## 2016-06-09 DIAGNOSIS — I5022 Chronic systolic (congestive) heart failure: Secondary | ICD-10-CM | POA: Diagnosis present

## 2016-06-09 DIAGNOSIS — K8689 Other specified diseases of pancreas: Secondary | ICD-10-CM | POA: Diagnosis present

## 2016-06-09 DIAGNOSIS — Z791 Long term (current) use of non-steroidal anti-inflammatories (NSAID): Secondary | ICD-10-CM

## 2016-06-09 DIAGNOSIS — Z8249 Family history of ischemic heart disease and other diseases of the circulatory system: Secondary | ICD-10-CM

## 2016-06-09 DIAGNOSIS — E785 Hyperlipidemia, unspecified: Secondary | ICD-10-CM

## 2016-06-09 DIAGNOSIS — F1320 Sedative, hypnotic or anxiolytic dependence, uncomplicated: Secondary | ICD-10-CM | POA: Diagnosis present

## 2016-06-09 DIAGNOSIS — K5732 Diverticulitis of large intestine without perforation or abscess without bleeding: Principal | ICD-10-CM | POA: Diagnosis present

## 2016-06-09 DIAGNOSIS — Z86711 Personal history of pulmonary embolism: Secondary | ICD-10-CM

## 2016-06-09 DIAGNOSIS — M545 Low back pain: Secondary | ICD-10-CM | POA: Diagnosis present

## 2016-06-09 DIAGNOSIS — J438 Other emphysema: Secondary | ICD-10-CM

## 2016-06-09 DIAGNOSIS — I959 Hypotension, unspecified: Secondary | ICD-10-CM

## 2016-06-09 DIAGNOSIS — I420 Dilated cardiomyopathy: Secondary | ICD-10-CM | POA: Diagnosis present

## 2016-06-09 DIAGNOSIS — I4892 Unspecified atrial flutter: Secondary | ICD-10-CM | POA: Diagnosis present

## 2016-06-09 DIAGNOSIS — Z886 Allergy status to analgesic agent status: Secondary | ICD-10-CM

## 2016-06-09 DIAGNOSIS — Z79891 Long term (current) use of opiate analgesic: Secondary | ICD-10-CM

## 2016-06-09 DIAGNOSIS — K219 Gastro-esophageal reflux disease without esophagitis: Secondary | ICD-10-CM | POA: Diagnosis present

## 2016-06-09 DIAGNOSIS — Z9842 Cataract extraction status, left eye: Secondary | ICD-10-CM

## 2016-06-09 LAB — LIPASE, BLOOD: Lipase: 18 U/L (ref 11–51)

## 2016-06-09 LAB — URINALYSIS, ROUTINE W REFLEX MICROSCOPIC
BILIRUBIN URINE: NEGATIVE
Glucose, UA: NEGATIVE mg/dL
HGB URINE DIPSTICK: NEGATIVE
KETONES UR: NEGATIVE mg/dL
Nitrite: NEGATIVE
PH: 7.5 (ref 5.0–8.0)
Protein, ur: NEGATIVE mg/dL

## 2016-06-09 LAB — COMPREHENSIVE METABOLIC PANEL
ALBUMIN: 3.7 g/dL (ref 3.5–5.0)
ALK PHOS: 43 U/L (ref 38–126)
ALT: 12 U/L — ABNORMAL LOW (ref 14–54)
ANION GAP: 12 (ref 5–15)
AST: 19 U/L (ref 15–41)
BILIRUBIN TOTAL: 0.8 mg/dL (ref 0.3–1.2)
BUN: 12 mg/dL (ref 6–20)
CALCIUM: 10.6 mg/dL — AB (ref 8.9–10.3)
CO2: 24 mmol/L (ref 22–32)
Chloride: 98 mmol/L — ABNORMAL LOW (ref 101–111)
Creatinine, Ser: 0.99 mg/dL (ref 0.44–1.00)
GFR calc Af Amer: >60 mL/min (ref >60)
GFR calc non Af Amer: 54 mL/min — ABNORMAL LOW (ref >60)
GLUCOSE: 161 mg/dL — AB (ref 65–99)
Potassium: 3.9 mmol/L (ref 3.5–5.1)
SODIUM: 134 mmol/L — AB (ref 135–145)
TOTAL PROTEIN: 6.4 g/dL — AB (ref 6.5–8.1)

## 2016-06-09 LAB — CBC
HEMATOCRIT: 33.2 % — AB (ref 36.0–46.0)
HEMOGLOBIN: 10.3 g/dL — AB (ref 12.0–15.0)
MCH: 26.8 pg (ref 26.0–34.0)
MCHC: 31 g/dL (ref 30.0–36.0)
MCV: 86.5 fL (ref 78.0–100.0)
Platelets: 287 10*3/uL (ref 150–400)
RBC: 3.84 MIL/uL — ABNORMAL LOW (ref 3.87–5.11)
RDW: 16.9 % — AB (ref 11.5–15.5)
WBC: 15.1 10*3/uL — ABNORMAL HIGH (ref 4.0–10.5)

## 2016-06-09 LAB — TYPE AND SCREEN
ABO/RH(D): A POS
Antibody Screen: NEGATIVE

## 2016-06-09 LAB — I-STAT CG4 LACTIC ACID, ED: Lactic Acid, Venous: 1.66 mmol/L (ref 0.5–1.9)

## 2016-06-09 LAB — URINE MICROSCOPIC-ADD ON: RBC / HPF: NONE SEEN RBC/hpf (ref 0–5)

## 2016-06-09 LAB — CBG MONITORING, ED: GLUCOSE-CAPILLARY: 123 mg/dL — AB (ref 65–99)

## 2016-06-09 LAB — PROTIME-INR
INR: 2.03
PROTHROMBIN TIME: 23.2 s — AB (ref 11.4–15.2)

## 2016-06-09 MED ORDER — SODIUM CHLORIDE 0.9 % IV BOLUS (SEPSIS)
1000.0000 mL | Freq: Once | INTRAVENOUS | Status: AC
  Administered 2016-06-09: 1000 mL via INTRAVENOUS

## 2016-06-09 MED ORDER — ONDANSETRON HCL 4 MG/2ML IJ SOLN
4.0000 mg | Freq: Once | INTRAMUSCULAR | Status: DC
  Filled 2016-06-09: qty 2

## 2016-06-09 MED ORDER — HYDROMORPHONE HCL 1 MG/ML IJ SOLN: 0.5000 mg | Freq: Once | INTRAMUSCULAR | Status: DC

## 2016-06-09 MED ORDER — METRONIDAZOLE IN NACL 5-0.79 MG/ML-% IV SOLN: 500.0000 mg | Freq: Once | INTRAVENOUS | Status: DC

## 2016-06-09 MED ORDER — VANCOMYCIN HCL IN DEXTROSE 750-5 MG/150ML-% IV SOLN
750.0000 mg | Freq: Two times a day (BID) | INTRAVENOUS | Status: DC
  Administered 2016-06-09: 750 mg via INTRAVENOUS
  Filled 2016-06-09 (×2): qty 150

## 2016-06-09 MED ORDER — SODIUM CHLORIDE 0.9 % IV BOLUS (SEPSIS)
250.0000 mL | Freq: Once | INTRAVENOUS | Status: AC
  Administered 2016-06-09: 250 mL via INTRAVENOUS

## 2016-06-09 MED ORDER — DEXTROSE 5 % IV SOLN
1.0000 g | Freq: Once | INTRAVENOUS | Status: AC
  Administered 2016-06-09: 1 g via INTRAVENOUS
  Filled 2016-06-09: qty 10

## 2016-06-09 MED ORDER — METRONIDAZOLE IN NACL 5-0.79 MG/ML-% IV SOLN
500.0000 mg | Freq: Three times a day (TID) | INTRAVENOUS | Status: DC
Start: 2016-06-09 — End: 2016-06-12
  Administered 2016-06-09 – 2016-06-12 (×8): 500 mg via INTRAVENOUS
  Filled 2016-06-09 (×10): qty 100

## 2016-06-09 MED ORDER — VANCOMYCIN HCL IN DEXTROSE 1-5 GM/200ML-% IV SOLN: 1000.0000 mg | Freq: Once | INTRAVENOUS | Status: DC

## 2016-06-09 NOTE — ED Triage Notes (Signed)
Pt reports R sided abdominal pain starting today, seen yesterday for same. Pt insisting that she is going to pass out in triage. Reports 10/10, reports "just pain." Wears 2L [place] at home.

## 2016-06-09 NOTE — ED Notes (Signed)
Nurse drawing labs. 

## 2016-06-09 NOTE — Progress Notes (Signed)
Pharmacy Antibiotic Note  Sarah Mcguire is a 75 y.o. female admitted on 06/09/2016 with intra abdominal.  Pharmacy has been consulted for vancomycin and metronidazole dosing.  Plan: Flagyl 500 mg iv q8h Vancomycin 750 mg q 12h Ceftriaxone per MD Monitor renal fx, vt prn    Temp (24hrs), Avg:97.8 F (36.6 C), Min:97.8 F (36.6 C), Max:97.8 F (36.6 C)   Recent Labs Lab 06/08/16 0753 06/08/16 0807  WBC 9.6  --   CREATININE 0.71  --   LATICACIDVEN  --  1.39    Estimated Creatinine Clearance: 57.4 mL/min (by C-G formula based on SCr of 0.71 mg/dL).    Levester Fresh, PharmD, BCPS, [facility] Clinical Pharmacist Pager [phone] 06/09/2016 8:39 PM

## 2016-06-09 NOTE — ED Notes (Signed)
Pt reports that she has right lower quadrant pain that she rates at a 5/10. Pt states that she began having pain "earlier" today.  Pt is noted to be restless and anxious at this time.   Chief Complaint  Patient presents with  . Abdominal Pain   Past Medical History:  Diagnosis Date  . Anemia   . Anxiety    SEVERE  . Arthritis    "probably in my back" (05/08/2016)  . Atrial fibrillation with RVR ([place]) 11/28/2012  . Chronic lower back pain   . Diverticulitis   . DVT (deep venous thrombosis) ([place])   . Emphysema of lung ([place])   . GERD (gastroesophageal reflux disease)   . High cholesterol   . Hyperglycemia   . Hyperlipidemia   . Hypertension   . Leukocytosis   . Neuropathy ([place])   . On home oxygen therapy    "2L; 24/7 prn" (05/08/2016)  . PE (pulmonary embolism)    Bilateral PE, stringy saddle embolus with predominant proximal lower lobe pulmonary artery involvement 06/26/12  . Type II diabetes mellitus ([place])

## 2016-06-09 NOTE — ED Provider Notes (Addendum)
[facility] DEPT Provider Note   CSN: [id number] Arrival date & time: 06/09/16  2000     History   Chief Complaint Chief Complaint  Patient presents with  . Abdominal Pain    HPI ANALY STADLER is a 75 y.o. female.  Patient c/o severe right sided abdominal pain for the past day - onset yesterday. Pain constant, dull, severe, progressively worse today. No hx same pain. No specific exacerbating or alleviating factors. No radiation of pain or back pain. Nausea. No vomiting or diarrhea. No constipation. Denies dysuria or hematuria.  Denies hx gallstones. Reports fever, temp 101 at home.     The history is provided by the patient.  Abdominal Pain   Associated symptoms include fever and nausea. Pertinent negatives include dysuria, hematuria and headaches.    Past Medical History:  Diagnosis Date  . Anemia   . Anxiety    SEVERE  . Arthritis    "probably in my back" (05/08/2016)  . Atrial fibrillation with RVR ([place]) 11/28/2012  . Chronic lower back pain   . Diverticulitis   . DVT (deep venous thrombosis) ([place])   . Emphysema of lung ([place])   . GERD (gastroesophageal reflux disease)   . High cholesterol   . Hyperglycemia   . Hyperlipidemia   . Hypertension   . Leukocytosis   . Neuropathy ([place])   . On home oxygen therapy    "2L; 24/7 prn" (05/08/2016)  . PE (pulmonary embolism)    Bilateral PE, stringy saddle embolus with predominant proximal lower lobe pulmonary artery involvement 06/26/12  . Type II diabetes mellitus [facility])     Patient Active Problem List   Diagnosis Date Noted  . Chronic systolic CHF (congestive heart failure) ([place]) 05/30/2016  . COPD (chronic obstructive pulmonary disease) ([place]) 04/02/2016  . Chronic hypoxemic respiratory failure ([place]) 03/19/2016  . Hypothyroidism 09/14/2015  . Borderline low O2 saturation 05/24/2015  . Premature atrial contraction 10/21/2014  . Abnormal EKG 10/21/2014  . History of pulmonary embolism 10/21/2014  . Atrial flutter  ([place]) 08/30/2014  . Right hip pain 08/01/2014  . Atypical chest pain 04/22/2014  . Vaginitis and vulvovaginitis 03/22/2014  . UTI symptoms 03/22/2014  . Knee pain 01/11/2014  . External hemorrhoid, bleeding 11/02/2013  . Encounter for therapeutic drug monitoring 10/15/2013  . Cataract of both eyes 08/03/2013  . Right groin pain 03/31/2013  . Insomnia 12/09/2012  . Atrial fibrillation with RVR ([place]) 11/28/2012  . Osteoarthritis of left knee 11/26/2012  . Leg pain 11/03/2012  . Left knee pain 10/31/2012  . Long term (current) use of anticoagulants 07/16/2012  . PE (pulmonary embolism) 06/30/2012  . GERD (gastroesophageal reflux disease) 06/26/2012  . Anxiety 06/26/2012  . DJD (degenerative joint disease) 06/26/2012  . Palpitations 06/25/2012  . Chest pain 02/13/2012  . Fatigue 02/01/2012  . Panic attack 11/13/2011  . General medical examination 07/21/2011  . Osteoporosis 07/09/2011  . Right sided abdominal pain 03/26/2011  . Plantar fasciitis 03/13/2011  . Diverticulitis 12/13/2010  . DYSURIA 10/24/2010  . RHINITIS 10/02/2010  . CARDIOMYOPATHY, DILATED 08/01/2010  . OBESITY 05/05/2010  . HIP PAIN 02/28/2010  . PLANTAR FASCIITIS, RIGHT 02/24/2010  . EDEMA- LOCALIZED 02/20/2010  . PORTAL VEIN THROMBOSIS 01/23/2010  . HEART FAILURE, CONGESTIVE UNSPEC 01/09/2010  . CANDIDIASIS OF MOUTH 01/02/2010  . ANEMIA 01/02/2010  . Leukocytosis 01/02/2010  . THROMBOCYTOSIS 01/02/2010  . GERD 01/02/2010  . VAGINITIS, ATROPHIC 12/05/2009  . BACK PAIN, CHRONIC 11/08/2009  . Diabetes mellitus with neuropathy ([place]) 11/07/2009  .  SMOKER 08/31/2009  . UTI 06/13/2009  . VAGINITIS 06/06/2009  . EUSTACHIAN TUBE DYSFUNCTION 03/28/2009  . HEART MURMUR, SYSTOLIC 0000000  . SINUSITIS- ACUTE-NOS 11/01/2008  . DIVERTICULITIS OF COLON 09/06/2008  . Hyperlipidemia associated with type 2 diabetes mellitus ([place]) 07/30/2008  . Anxiety and depression 07/30/2008  . Essential hypertension 07/30/2008      Past Surgical History:  Procedure Laterality Date  . CARDIAC CATHETERIZATION N/A 05/30/2016   Procedure: Left Heart Cath and Coronary Angiography;  Surgeon: Peter M Martinique, MD;  Location: [facility];  Service: Cardiovascular;  Laterality: N/A;  . CATARACT EXTRACTION W/ INTRAOCULAR LENS  IMPLANT, BILATERAL Bilateral 08/2013  . JOINT REPLACEMENT    . TONSILLECTOMY    . TOTAL KNEE ARTHROPLASTY Left 11/24/2012   Procedure: TOTAL KNEE ARTHROPLASTY;  Surgeon: Kerin Salen, MD;  Location: [facility];  Service: Orthopedics;  Laterality: Left;  . TYMPANOSTOMY TUBE PLACEMENT Right early 2000s    OB History    Gravida Para Term Preterm AB Living   2 2 2     2    SAB TAB Ectopic Multiple Live Births                   Home Medications    Prior to Admission medications   Medication Sig Start Date End Date Taking? Authorizing Provider  acetaminophen (TYLENOL) 325 MG tablet Take 325-650 mg by mouth every 6 (six) hours as needed (for headache).     Historical Provider, MD  albuterol (PROVENTIL HFA;VENTOLIN HFA) 108 (90 Base) MCG/ACT inhaler Inhale 2 puffs into the lungs every 6 (six) hours as needed for wheezing or shortness of breath. 04/17/16   Tammy S Parrett, NP  alprazolam (XANAX) 2 MG tablet TAKE 1/2 TO 1 TABLET BY MOUTH 3 TIMES A DAY AS NEEDED FOR SLEEP OR ANXIETY Patient taking differently: TAKE 1 TABLET BY MOUTH THREE TIMES DAILY 05/11/16   Midge Minium, MD  calcium citrate-vitamin D (CITRACAL+D) 315-200 MG-UNIT tablet Take 2 tablets by mouth daily.    Historical Provider, MD  Cholecalciferol (VITAMIN D) 2000 UNITS tablet Take 2,000 Units by mouth daily.    Historical Provider, MD  clobetasol cream (TEMOVATE) 0.05 % APPLY EXTERNALLY AT BEDTIME FOR ONE MONTH THEN AS NEEDED FOR ITCHING 02/24/16   Anastasio Auerbach, MD  esomeprazole (NEXIUM) 40 MG capsule Take 1 capsule (40 mg total) by mouth daily as needed. For acid reflux. 05/24/15   Midge Minium, MD  fenofibrate 160 MG tablet  Take 1 tablet (160 mg total) by mouth daily. 03/20/16   Midge Minium, MD  Fluticasone Furoate-Vilanterol (BREO ELLIPTA IN) Inhale 1 puff into the lungs daily.    Historical Provider, MD  folic acid (FOLVITE) 1 MG tablet Take 1 tablet (1 mg total) by mouth daily. 05/31/14   Midge Minium, MD  furosemide (LASIX) 40 MG tablet TAKE 1 TABLET BY MOUTH EVERY DAY Patient taking differently: Take 20 mg by mouth every day 04/23/16   Midge Minium, MD  glucose blood (ONE TOUCH ULTRA TEST) test strip TEST TWICE A DAY AS DIRECTED 12/19/15   Midge Minium, MD  HYDROcodone-acetaminophen (NORCO/VICODIN) 5-325 MG tablet Take 1-2 tablets by mouth every 4 (four) hours as needed. Patient taking differently: Take 1-2 tablets by mouth every 4 (four) hours as needed for moderate pain.  06/04/16   Midge Minium, MD  levothyroxine (SYNTHROID, LEVOTHROID) 50 MCG tablet Take 1 tablet (50 mcg total) by mouth daily. Patient taking differently:  Take 50 mcg by mouth daily before breakfast.  12/19/15   Midge Minium, MD  lidocaine (LIDODERM) 5 % Place 1 patch onto the skin daily as needed (for pain.). Remove & Discard patch within 12 hours or as directed by MD    Historical Provider, MD  lidocaine (XYLOCAINE) 5 % ointment APPLY 1 APPLICATION TOPICALLY 3 (THREE) TIMES DAILY AS NEEDED. FOR ITCHING DIRECTLY TO THE SPOT 02/24/16   Anastasio Auerbach, MD  lisinopril (PRINIVIL,ZESTRIL) 10 MG tablet TAKE 1 TABLET BY MOUTH EVERY DAY Patient taking differently: Take 10 mg by mouth every day 05/28/16   Midge Minium, MD  meloxicam (MOBIC) 7.5 MG tablet Take 7.5 mg by mouth daily as needed for pain.    Historical Provider, MD  metFORMIN (GLUCOPHAGE-XR) 500 MG 24 hr tablet Take 500-1,000 mg by mouth See admin instructions. Pt takes 1000mg  in morning after breakfast, 500mg  in evening after dinner    Historical Provider, MD  methocarbamol (ROBAXIN) 500 MG tablet Take 500 mg by mouth 3 (three) times daily.     Historical Provider, MD  metoprolol succinate (TOPROL XL) 25 MG 24 hr tablet Take 1 tablet (25 mg total) by mouth daily. 05/22/16   Eileen Stanford, PA-C  Multiple Vitamin (MULTIVITAMIN) tablet Take 1 tablet by mouth daily.      Historical Provider, MD  naproxen (NAPROSYN) 500 MG tablet Take 500 mg by mouth 2 (two) times daily as needed for mild pain.    Historical Provider, MD  ranitidine (ZANTAC) 150 MG tablet Take 1 tablet (150 mg total) by mouth 2 (two) times daily as needed for heartburn. 06/08/16   Leo Grosser, MD  sertraline (ZOLOFT) 25 MG tablet Take 1 tablet (25 mg total) by mouth at bedtime. 06/04/16   Midge Minium, MD  simvastatin (ZOCOR) 20 MG tablet TAKE 1 TABLET BY MOUTH EVERY EVENING Patient taking differently: Take 20 mg by mouth every evening 05/28/16   Midge Minium, MD  terconazole (TERAZOL 7) 0.4 % vaginal cream Place 1 applicator vaginally at bedtime as needed (itching).    Historical Provider, MD  traMADol (ULTRAM) 50 MG tablet Take 50 mg by mouth every 6 (six) hours as needed for moderate pain.    Historical Provider, MD  warfarin (COUMADIN) 5 MG tablet Take 2.5-5 mg by mouth See admin instructions. Pt takes 2.5mg  on Monday, Friday, Saturday - pt takes 5mg  Tuesday, Thursday, Sunday    Historical Provider, MD    Family History Family History  Problem Relation Age of Onset  . Diabetes Mother   . Hypertension Mother   . Heart disease Father     MI age 81's   . Diabetes Brother   . Hypertension Brother     Social History Social History  Substance Use Topics  . Smoking status: Former Smoker    Packs/day: 0.50    Years: 60.00    Types: Cigarettes    Quit date: 03/18/2016  . Smokeless tobacco: Never Used  . Alcohol use No     Allergies   Morphine and related; Cymbalta [duloxetine hcl]; Amoxicillin; Amoxicillin-pot clavulanate; Citalopram hydrobromide; Clonazepam; Macrobid [nitrofurantoin macrocrystal]; Penicillins; Pyridium [phenazopyridine hcl];  Sertraline hcl; Wellbutrin [bupropion]; Zonegran; and Zonisamide   Review of Systems Review of Systems  Constitutional: Positive for fever.  HENT: Negative for sinus pressure.   Eyes: Negative for redness.  Respiratory: Negative for shortness of breath.   Cardiovascular: Negative for chest pain.  Gastrointestinal: Positive for abdominal pain and nausea.  Genitourinary:  Negative for dysuria, flank pain and hematuria.  Musculoskeletal: Negative for back pain and neck pain.  Skin: Negative for rash.  Neurological: Negative for headaches.  Hematological: Does not bruise/bleed easily.  Psychiatric/Behavioral: Negative for confusion.     Physical Exam Updated Vital Signs BP 95/63   Pulse 89   Temp 97.8 F (36.6 C) (Oral)   Resp 22   LMP  (LMP Unknown)   SpO2 100%   Physical Exam  Constitutional: She appears well-developed and well-nourished.  Patient very uncomfortable appearing, c/o severe abdominal pain, hypotensive.   HENT:  Mouth/Throat: Oropharynx is clear and moist.  Eyes: Conjunctivae are normal. No scleral icterus.  Neck: Neck supple. No tracheal deviation present.  Cardiovascular: Normal rate, regular rhythm, normal heart sounds and intact distal pulses.  Exam reveals no gallop and no friction rub.   No murmur heard. Pulmonary/Chest: Effort normal and breath sounds normal. No respiratory distress.  Abdominal: Soft. Normal appearance. She exhibits no distension and no mass. There is tenderness. There is guarding. There is no rebound. No hernia.  Severe right abd tenderness.   Genitourinary:  Genitourinary Comments: No cva tenderness  Musculoskeletal: She exhibits no edema.  Neurological: She is alert.  Skin: Skin is warm and dry. No rash noted.  Psychiatric:  Anxious appearing.   Nursing note and vitals reviewed.    ED Treatments / Results  Labs (all labs ordered are listed, but only abnormal results are displayed) Results for orders placed or performed  during the hospital encounter of 06/09/16  Lipase, blood  Result Value Ref Range   Lipase 18 11 - 51 U/L  Comprehensive metabolic panel  Result Value Ref Range   Sodium 134 (L) 135 - 145 mmol/L   Potassium 3.9 3.5 - 5.1 mmol/L   Chloride 98 (L) 101 - 111 mmol/L   CO2 24 22 - 32 mmol/L   Glucose, Bld 161 (H) 65 - 99 mg/dL   BUN 12 6 - 20 mg/dL   Creatinine, Ser 0.99 0.44 - 1.00 mg/dL   Calcium 10.6 (H) 8.9 - 10.3 mg/dL   Total Protein 6.4 (L) 6.5 - 8.1 g/dL   Albumin 3.7 3.5 - 5.0 g/dL   AST 19 15 - 41 U/L   ALT 12 (L) 14 - 54 U/L   Alkaline Phosphatase 43 38 - 126 U/L   Total Bilirubin 0.8 0.3 - 1.2 mg/dL   GFR calc non Af Amer 54 (L) >60 mL/min   GFR calc Af Amer >60 >60 mL/min   Anion gap 12 5 - 15  CBC  Result Value Ref Range   WBC 15.1 (H) 4.0 - 10.5 K/uL   RBC 3.84 (L) 3.87 - 5.11 MIL/uL   Hemoglobin 10.3 (L) 12.0 - 15.0 g/dL   HCT 33.2 (L) 36.0 - 46.0 %   MCV 86.5 78.0 - 100.0 fL   MCH 26.8 26.0 - 34.0 pg   MCHC 31.0 30.0 - 36.0 g/dL   RDW 16.9 (H) 11.5 - 15.5 %   Platelets 287 150 - 400 K/uL  Protime-INR  Result Value Ref Range   Prothrombin Time 23.2 (H) 11.4 - 15.2 seconds   INR 2.03   I-Stat CG4 Lactic Acid, ED  (not at  [facility])  Result Value Ref Range   Lactic Acid, Venous 1.66 0.5 - 1.9 mmol/L  Type and screen  Result Value Ref Range   ABO/RH(D) A POS    Antibody Screen NEG    Sample Expiration 06/12/2016    Ct  Abdomen Pelvis Wo Contrast  Result Date: 06/09/2016 CLINICAL DATA:  Severe right-sided pain. EXAM: CT ABDOMEN AND PELVIS WITHOUT CONTRAST TECHNIQUE: Multidetector CT imaging of the abdomen and pelvis was performed following the standard protocol without IV contrast. COMPARISON:  None. 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 No other significant abnormalities in the lower chest. Hepatobiliary: Cholelithiasis is identified with mild distention of the gallbladder but no wall thickening or pericholecystic fluid.  The liver is unremarkable. Pancreas: The pancreatic head, neck, and body are normal. There is abnormal soft tissue attenuation in the region of the pancreatic tail which is new since July of 2012. This masslike finding is between the stomach and spleen measuring 5.2 x 3.6 cm on image 25 with mild adjacent fat stranding and an attenuation of 28 Hounsfield units. This finding is new since 2012. Spleen: Normal in size without focal abnormality. Adrenals/Urinary Tract: Nodularity in the left adrenal gland is stable. The right adrenal gland is normal. There is a stone in the lower left kidney which is stable. No hydronephrosis. No ureterectasis or ureteral stones. Stomach/Bowel: The bladder is normal in appearance. The small bowel is unremarkable. Colonic diverticuli are identified. There is significant stranding around the sigmoid colon with associated colonic wall thickening. These findings are consistent with diverticulitis. Evaluation for abscess is limited without contrast but none are seen. No extraluminal gas identified. The remainder of the colon is unremarkable. The visualized appendix is normal as well. Vascular/Lymphatic: Atherosclerosis is seen in the aorta as well as the iliac and femoral vessels. No aneurysm. No adenopathy. Reproductive: Uterus and bilateral adnexa are unremarkable. Other: No other abnormalities. Musculoskeletal: No acute bony abnormalities. Grade 1 anterolisthesis of L4 versus L5 is stable. IMPRESSION: 1. Sigmoid diverticulitis. 2. 5.2 x 3.6 cm masslike abnormality in the region of the pancreatic tail, between the spleen and the stomach. This is not well assessed without contrast. It is possible this could represent a complex fluid collection including a complex pseudocyst. However, a neoplasm is not excluded. Recommend clinical correlation. Recommend a CT scan with intravenous contrast for better assessment. 3. Cholelithiasis. 4. Atherosclerosis in the abdominal aorta. Electronically  Signed   By: Dorise Bullion III M.D   On: 06/09/2016 22:26   Dg Chest Port 1 View  Result Date: 06/09/2016 CLINICAL DATA:  Acute onset of generalized chest pain and right lower quadrant abdominal pain. Initial encounter. EXAM: PORTABLE CHEST 1 VIEW COMPARISON:  Chest radiograph performed 05/07/2016, and CTA of the chest performed 03/19/2016 FINDINGS: The lungs are well-aerated. Minimal bibasilar atelectasis is noted. There is no evidence of pleural effusion or pneumothorax. The cardiomediastinal silhouette is mildly enlarged. No acute osseous abnormalities are seen. IMPRESSION: Minimal bibasilar atelectasis noted. Lungs otherwise clear. Mild cardiomegaly. Electronically Signed   By: Garald Balding M.D.   On: 06/09/2016 21:18    EKG  EKG Interpretation  Date/Time:  Saturday June 09 2016 21:10:57 EDT Ventricular Rate:  88 PR Interval:    QRS Duration: 95 QT Interval:  388 QTC Calculation: 470 R Axis:   16 Text Interpretation:  Sinus rhythm Premature atrial complexes Premature ventricular complexes Non-specific ST-t changes No significant change since last tracing Confirmed by Ashok Cordia  MD, Lennette Bihari ([id number]) on 06/09/2016 9:24:24 PM       Radiology Ct Abdomen Pelvis Wo Contrast  Result Date: 06/09/2016 CLINICAL DATA:  Severe right-sided pain. EXAM: CT ABDOMEN AND PELVIS WITHOUT CONTRAST TECHNIQUE: Multidetector CT imaging of the abdomen and pelvis was performed following  the standard protocol without IV contrast. COMPARISON:  None. 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 No other significant abnormalities in the lower chest. Hepatobiliary: Cholelithiasis is identified with mild distention of the gallbladder but no wall thickening or pericholecystic fluid. The liver is unremarkable. Pancreas: The pancreatic head, neck, and body are normal. There is abnormal soft tissue attenuation in the region of the pancreatic tail which is new since July of 2012. This  masslike finding is between the stomach and spleen measuring 5.2 x 3.6 cm on image 25 with mild adjacent fat stranding and an attenuation of 28 Hounsfield units. This finding is new since 2012. Spleen: Normal in size without focal abnormality. Adrenals/Urinary Tract: Nodularity in the left adrenal gland is stable. The right adrenal gland is normal. There is a stone in the lower left kidney which is stable. No hydronephrosis. No ureterectasis or ureteral stones. Stomach/Bowel: The bladder is normal in appearance. The small bowel is unremarkable. Colonic diverticuli are identified. There is significant stranding around the sigmoid colon with associated colonic wall thickening. These findings are consistent with diverticulitis. Evaluation for abscess is limited without contrast but none are seen. No extraluminal gas identified. The remainder of the colon is unremarkable. The visualized appendix is normal as well. Vascular/Lymphatic: Atherosclerosis is seen in the aorta as well as the iliac and femoral vessels. No aneurysm. No adenopathy. Reproductive: Uterus and bilateral adnexa are unremarkable. Other: No other abnormalities. Musculoskeletal: No acute bony abnormalities. Grade 1 anterolisthesis of L4 versus L5 is stable. IMPRESSION: 1. Sigmoid diverticulitis. 2. 5.2 x 3.6 cm masslike abnormality in the region of the pancreatic tail, between the spleen and the stomach. This is not well assessed without contrast. It is possible this could represent a complex fluid collection including a complex pseudocyst. However, a neoplasm is not excluded. Recommend clinical correlation. Recommend a CT scan with intravenous contrast for better assessment. 3. Cholelithiasis. 4. Atherosclerosis in the abdominal aorta. Electronically Signed   By: Dorise Bullion III M.D   On: 06/09/2016 22:26   Dg Chest Port 1 View  Result Date: 06/09/2016 CLINICAL DATA:  Acute onset of generalized chest pain and right lower quadrant abdominal pain.  Initial encounter. EXAM: PORTABLE CHEST 1 VIEW COMPARISON:  Chest radiograph performed 05/07/2016, and CTA of the chest performed 03/19/2016 FINDINGS: The lungs are well-aerated. Minimal bibasilar atelectasis is noted. There is no evidence of pleural effusion or pneumothorax. The cardiomediastinal silhouette is mildly enlarged. No acute osseous abnormalities are seen. IMPRESSION: Minimal bibasilar atelectasis noted. Lungs otherwise clear. Mild cardiomegaly. Electronically Signed   By: Garald Balding M.D.   On: 06/09/2016 21:18    Procedures Procedures (including critical care time)  Medications Ordered in ED Medications  sodium chloride 0.9 % bolus 1,000 mL (1,000 mLs Intravenous New Bag/Given 06/09/16 2050)    And  sodium chloride 0.9 % bolus 1,000 mL (0 mLs Intravenous Stopped 06/09/16 2142)    And  sodium chloride 0.9 % bolus 250 mL (not administered)  metroNIDAZOLE (FLAGYL) IVPB 500 mg (not administered)  vancomycin (VANCOCIN) IVPB 750 mg/150 ml premix (not administered)  cefTRIAXone (ROCEPHIN) 1 g in dextrose 5 % 50 mL IVPB (0 g Intravenous Stopped 06/09/16 2142)     Initial Impression / Assessment and Plan / ED Course  I have reviewed the triage vital signs and the nursing notes.  Pertinent labs & imaging results that were available during my care of the patient were reviewed by me and considered in  my medical decision making (see chart for details).  Clinical Course    Iv ns bolus. Continuous pulse ox and monitor.  Given initial hypotension, and report of fever, code sepsis initiated.  Iv ns boluses 30 cc/kg.   Cultures and labs sent.  Iv abx given. bp improved.   Pain rx given. zofran for nausea.   Stat ct.   CT read by radiology as c/w diverticulitis, and also pancreatitic mass, and gallstones.  Given r abd tenderness, u/s ordered to rule in or out cholecystitis.   Patient will also need further eval pancreatic mass, possibly mri, although would not need to be  emergently done in ED - admitting team to follow up on.   Pain improved w meds. bp improved from initial.   Med service consulted for admission.   CRITICAL CARE  RE hypotension, severe abdominal pain, sepsis, diverticulitis, gallstones. Pancreatic mass.  Performed by: Mirna Mires Total critical care time: 35 minutes Critical care time was exclusive of separately billable procedures and treating other patients. Critical care was necessary to treat or prevent imminent or life-threatening deterioration. Critical care was time spent personally by me on the following activities: development of treatment plan with patient and/or surrogate as well as nursing, discussions with consultants, evaluation of patient's response to treatment, examination of patient, obtaining history from patient or surrogate, ordering and performing treatments and interventions, ordering and review of laboratory studies, ordering and review of radiographic studies, pulse oximetry and re-evaluation of patient's condition.  Sepsis - Repeat Assessment  Performed at:    2345  Vitals     Blood pressure (!) 96/64, pulse 96, temperature 97.8 F (36.6 C), temperature source Oral, resp. rate 23, SpO2 100 %.  Heart:     Regular rate and rhythm  Lungs:    CTA  Capillary Refill:   <2 sec  Peripheral Pulse:   Radial pulse palpable  Skin:     Normal Color    The recent clinical data is shown below. Vitals:   06/09/16 2216 06/09/16 2230 06/09/16 2245 06/09/16 2300  BP: 95/63 96/66 94/58  (!) 86/65  Pulse:  96    Resp: 22 18 21 23   Temp:      TempSrc:      SpO2:  100%       Discussed pt with Dr Genene Churn, Hospitalists, who indicates he will see in ED and admit.  Also discussed w him that u/s reading is pending, and that pt will also need further assessment pancreatis mass/cyst - he is coming to see/admit.    Final Clinical Impressions(s) / ED Diagnoses   Final diagnoses:  None    New Prescriptions New  Prescriptions   No medications on file            Lajean Saver, MD 06/10/16 0020

## 2016-06-10 ENCOUNTER — Encounter (HOSPITAL_COMMUNITY): Payer: Self-pay | Admitting: *Deleted

## 2016-06-10 DIAGNOSIS — Z9981 Dependence on supplemental oxygen: Secondary | ICD-10-CM | POA: Diagnosis not present

## 2016-06-10 DIAGNOSIS — F1320 Sedative, hypnotic or anxiolytic dependence, uncomplicated: Secondary | ICD-10-CM | POA: Diagnosis present

## 2016-06-10 DIAGNOSIS — K8020 Calculus of gallbladder without cholecystitis without obstruction: Secondary | ICD-10-CM

## 2016-06-10 DIAGNOSIS — I420 Dilated cardiomyopathy: Secondary | ICD-10-CM | POA: Diagnosis present

## 2016-06-10 DIAGNOSIS — E7800 Pure hypercholesterolemia, unspecified: Secondary | ICD-10-CM | POA: Diagnosis present

## 2016-06-10 DIAGNOSIS — I110 Hypertensive heart disease with heart failure: Secondary | ICD-10-CM | POA: Diagnosis present

## 2016-06-10 DIAGNOSIS — I5022 Chronic systolic (congestive) heart failure: Secondary | ICD-10-CM

## 2016-06-10 DIAGNOSIS — J9611 Chronic respiratory failure with hypoxia: Secondary | ICD-10-CM

## 2016-06-10 DIAGNOSIS — K5792 Diverticulitis of intestine, part unspecified, without perforation or abscess without bleeding: Secondary | ICD-10-CM | POA: Diagnosis not present

## 2016-06-10 DIAGNOSIS — K5732 Diverticulitis of large intestine without perforation or abscess without bleeding: Secondary | ICD-10-CM

## 2016-06-10 DIAGNOSIS — R339 Retention of urine, unspecified: Secondary | ICD-10-CM | POA: Diagnosis present

## 2016-06-10 DIAGNOSIS — R1031 Right lower quadrant pain: Secondary | ICD-10-CM | POA: Diagnosis not present

## 2016-06-10 DIAGNOSIS — I4892 Unspecified atrial flutter: Secondary | ICD-10-CM

## 2016-06-10 DIAGNOSIS — N39490 Overflow incontinence: Secondary | ICD-10-CM | POA: Diagnosis present

## 2016-06-10 DIAGNOSIS — E039 Hypothyroidism, unspecified: Secondary | ICD-10-CM

## 2016-06-10 DIAGNOSIS — K869 Disease of pancreas, unspecified: Secondary | ICD-10-CM | POA: Diagnosis not present

## 2016-06-10 DIAGNOSIS — K219 Gastro-esophageal reflux disease without esophagitis: Secondary | ICD-10-CM | POA: Diagnosis present

## 2016-06-10 DIAGNOSIS — I10 Essential (primary) hypertension: Secondary | ICD-10-CM | POA: Diagnosis not present

## 2016-06-10 DIAGNOSIS — I4891 Unspecified atrial fibrillation: Secondary | ICD-10-CM | POA: Diagnosis present

## 2016-06-10 DIAGNOSIS — E1140 Type 2 diabetes mellitus with diabetic neuropathy, unspecified: Secondary | ICD-10-CM | POA: Diagnosis present

## 2016-06-10 DIAGNOSIS — J449 Chronic obstructive pulmonary disease, unspecified: Secondary | ICD-10-CM | POA: Diagnosis present

## 2016-06-10 DIAGNOSIS — J439 Emphysema, unspecified: Secondary | ICD-10-CM

## 2016-06-10 DIAGNOSIS — E119 Type 2 diabetes mellitus without complications: Secondary | ICD-10-CM | POA: Diagnosis not present

## 2016-06-10 DIAGNOSIS — I959 Hypotension, unspecified: Secondary | ICD-10-CM

## 2016-06-10 DIAGNOSIS — G8929 Other chronic pain: Secondary | ICD-10-CM | POA: Diagnosis present

## 2016-06-10 DIAGNOSIS — K8689 Other specified diseases of pancreas: Secondary | ICD-10-CM | POA: Diagnosis not present

## 2016-06-10 DIAGNOSIS — F419 Anxiety disorder, unspecified: Secondary | ICD-10-CM | POA: Diagnosis present

## 2016-06-10 DIAGNOSIS — I2510 Atherosclerotic heart disease of native coronary artery without angina pectoris: Secondary | ICD-10-CM | POA: Diagnosis present

## 2016-06-10 DIAGNOSIS — H8020 Cochlear otosclerosis, unspecified ear: Secondary | ICD-10-CM | POA: Diagnosis not present

## 2016-06-10 DIAGNOSIS — E0840 Diabetes mellitus due to underlying condition with diabetic neuropathy, unspecified: Secondary | ICD-10-CM

## 2016-06-10 DIAGNOSIS — M545 Low back pain: Secondary | ICD-10-CM | POA: Diagnosis present

## 2016-06-10 DIAGNOSIS — I5042 Chronic combined systolic (congestive) and diastolic (congestive) heart failure: Secondary | ICD-10-CM | POA: Diagnosis present

## 2016-06-10 DIAGNOSIS — D509 Iron deficiency anemia, unspecified: Secondary | ICD-10-CM | POA: Diagnosis present

## 2016-06-10 LAB — COMPREHENSIVE METABOLIC PANEL
ALK PHOS: 38 U/L (ref 38–126)
ALT: 12 U/L — AB (ref 14–54)
AST: 16 U/L (ref 15–41)
Albumin: 3 g/dL — ABNORMAL LOW (ref 3.5–5.0)
Anion gap: 10 (ref 5–15)
BUN: 9 mg/dL (ref 6–20)
CALCIUM: 8.9 mg/dL (ref 8.9–10.3)
CO2: 25 mmol/L (ref 22–32)
CREATININE: 0.7 mg/dL (ref 0.44–1.00)
Chloride: 106 mmol/L (ref 101–111)
Glucose, Bld: 136 mg/dL — ABNORMAL HIGH (ref 65–99)
Potassium: 3.6 mmol/L (ref 3.5–5.1)
Sodium: 141 mmol/L (ref 135–145)
Total Bilirubin: 0.7 mg/dL (ref 0.3–1.2)
Total Protein: 5.4 g/dL — ABNORMAL LOW (ref 6.5–8.1)

## 2016-06-10 LAB — PROTIME-INR
INR: 2.28
Prothrombin Time: 25.5 seconds — ABNORMAL HIGH (ref 11.4–15.2)

## 2016-06-10 LAB — GLUCOSE, CAPILLARY
GLUCOSE-CAPILLARY: 123 mg/dL — AB (ref 65–99)
Glucose-Capillary: 165 mg/dL — ABNORMAL HIGH (ref 65–99)
Glucose-Capillary: 123 mg/dL — ABNORMAL HIGH (ref 65–99)
Glucose-Capillary: 137 mg/dL — ABNORMAL HIGH (ref 65–99)
Glucose-Capillary: 147 mg/dL — ABNORMAL HIGH (ref 65–99)
Glucose-Capillary: 161 mg/dL — ABNORMAL HIGH (ref 65–99)

## 2016-06-10 LAB — I-STAT CG4 LACTIC ACID, ED: LACTIC ACID, VENOUS: 0.4 mmol/L — AB (ref 0.5–1.9)

## 2016-06-10 MED ORDER — WARFARIN - PHARMACIST DOSING INPATIENT
Freq: Every day | Status: DC
  Administered 2016-06-10: 17:00:00

## 2016-06-10 MED ORDER — ALBUTEROL SULFATE (2.5 MG/3ML) 0.083% IN NEBU: 3.0000 mL | INHALATION_SOLUTION | Freq: Four times a day (QID) | RESPIRATORY_TRACT | Status: DC | PRN

## 2016-06-10 MED ORDER — WARFARIN SODIUM 3 MG PO TABS: 3.5000 mg | ORAL_TABLET | Freq: Once | ORAL | Status: DC

## 2016-06-10 MED ORDER — CLOTRIMAZOLE 1 % VA CREA
1.0000 | TOPICAL_CREAM | Freq: Every day | VAGINAL | Status: DC
  Filled 2016-06-10: qty 45

## 2016-06-10 MED ORDER — INSULIN ASPART 100 UNIT/ML ~~LOC~~ SOLN: 0.0000 [IU] | Freq: Every day | SUBCUTANEOUS | Status: DC

## 2016-06-10 MED ORDER — SERTRALINE HCL 50 MG PO TABS
25.0000 mg | ORAL_TABLET | Freq: Every day | ORAL | Status: DC
  Administered 2016-06-10 – 2016-06-12 (×4): 25 mg via ORAL
  Filled 2016-06-10 (×4): qty 1

## 2016-06-10 MED ORDER — METHOCARBAMOL 500 MG PO TABS
500.0000 mg | ORAL_TABLET | Freq: Three times a day (TID) | ORAL | Status: DC
  Filled 2016-06-10 (×2): qty 1

## 2016-06-10 MED ORDER — SIMVASTATIN 20 MG PO TABS
20.0000 mg | ORAL_TABLET | Freq: Every evening | ORAL | Status: DC
  Administered 2016-06-10 – 2016-06-12 (×3): 20 mg via ORAL
  Filled 2016-06-10 (×3): qty 1

## 2016-06-10 MED ORDER — FENOFIBRATE 160 MG PO TABS
160.0000 mg | ORAL_TABLET | Freq: Every day | ORAL | Status: DC
  Administered 2016-06-10 – 2016-06-13 (×4): 160 mg via ORAL
  Filled 2016-06-10 (×4): qty 1

## 2016-06-10 MED ORDER — HYDROMORPHONE HCL 1 MG/ML IJ SOLN
0.5000 mg | INTRAMUSCULAR | Status: DC | PRN
  Administered 2016-06-10 – 2016-06-11 (×4): 0.5 mg via INTRAVENOUS
  Filled 2016-06-10 (×4): qty 1

## 2016-06-10 MED ORDER — MORPHINE SULFATE (PF) 2 MG/ML IV SOLN
2.0000 mg | INTRAVENOUS | Status: DC | PRN
  Filled 2016-06-10: qty 1

## 2016-06-10 MED ORDER — HYDROCODONE-ACETAMINOPHEN 5-325 MG PO TABS
1.0000 | ORAL_TABLET | ORAL | Status: DC | PRN
  Administered 2016-06-10 – 2016-06-12 (×7): 1 via ORAL
  Filled 2016-06-10 (×8): qty 1

## 2016-06-10 MED ORDER — METOPROLOL SUCCINATE ER 25 MG PO TB24
25.0000 mg | ORAL_TABLET | Freq: Every day | ORAL | Status: DC
  Administered 2016-06-11 – 2016-06-13 (×3): 25 mg via ORAL
  Filled 2016-06-10 (×3): qty 1

## 2016-06-10 MED ORDER — SODIUM CHLORIDE 0.9 % IV BOLUS (SEPSIS)
500.0000 mL | Freq: Once | INTRAVENOUS | Status: AC
  Administered 2016-06-10: 500 mL via INTRAVENOUS

## 2016-06-10 MED ORDER — WARFARIN SODIUM 3 MG PO TABS
3.0000 mg | ORAL_TABLET | Freq: Once | ORAL | Status: AC
  Administered 2016-06-10: 3 mg via ORAL
  Filled 2016-06-10: qty 1

## 2016-06-10 MED ORDER — INSULIN ASPART 100 UNIT/ML ~~LOC~~ SOLN
0.0000 [IU] | Freq: Four times a day (QID) | SUBCUTANEOUS | Status: DC
  Administered 2016-06-10: 2 [IU] via SUBCUTANEOUS

## 2016-06-10 MED ORDER — INSULIN ASPART 100 UNIT/ML ~~LOC~~ SOLN: 0.0000 [IU] | Freq: Three times a day (TID) | SUBCUTANEOUS | Status: DC

## 2016-06-10 MED ORDER — FOLIC ACID 1 MG PO TABS
1.0000 mg | ORAL_TABLET | Freq: Every day | ORAL | Status: DC
  Administered 2016-06-10 – 2016-06-13 (×4): 1 mg via ORAL
  Filled 2016-06-10 (×4): qty 1

## 2016-06-10 MED ORDER — FLUTICASONE FUROATE-VILANTEROL 100-25 MCG/INH IN AEPB
1.0000 | INHALATION_SPRAY | Freq: Every day | RESPIRATORY_TRACT | Status: DC
  Administered 2016-06-11 – 2016-06-13 (×3): 1 via RESPIRATORY_TRACT
  Filled 2016-06-10: qty 28

## 2016-06-10 MED ORDER — INSULIN ASPART 100 UNIT/ML ~~LOC~~ SOLN
0.0000 [IU] | Freq: Three times a day (TID) | SUBCUTANEOUS | Status: DC
  Administered 2016-06-10 (×3): 1 [IU] via SUBCUTANEOUS
  Administered 2016-06-11: 2 [IU] via SUBCUTANEOUS
  Administered 2016-06-11 (×2): 1 [IU] via SUBCUTANEOUS
  Administered 2016-06-12: 2 [IU] via SUBCUTANEOUS
  Administered 2016-06-12: 1 [IU] via SUBCUTANEOUS
  Administered 2016-06-13 (×2): 2 [IU] via SUBCUTANEOUS

## 2016-06-10 MED ORDER — DEXTROSE-NACL 5-0.45 % IV SOLN
INTRAVENOUS | Status: DC
  Administered 2016-06-10 (×2): via INTRAVENOUS

## 2016-06-10 MED ORDER — ALPRAZOLAM 0.5 MG PO TABS
2.0000 mg | ORAL_TABLET | Freq: Three times a day (TID) | ORAL | Status: DC
  Administered 2016-06-10: 2 mg via ORAL
  Filled 2016-06-10: qty 4

## 2016-06-10 MED ORDER — INFLUENZA VAC SPLIT QUAD 0.5 ML IM SUSY
0.5000 mL | PREFILLED_SYRINGE | Freq: Once | INTRAMUSCULAR | Status: DC
  Filled 2016-06-10: qty 0.5

## 2016-06-10 MED ORDER — CIPROFLOXACIN IN D5W 400 MG/200ML IV SOLN
400.0000 mg | Freq: Two times a day (BID) | INTRAVENOUS | Status: DC
  Administered 2016-06-10 – 2016-06-11 (×4): 400 mg via INTRAVENOUS
  Filled 2016-06-10 (×6): qty 200

## 2016-06-10 MED ORDER — ALPRAZOLAM 0.5 MG PO TABS
1.0000 mg | ORAL_TABLET | ORAL | Status: DC | PRN
  Administered 2016-06-10 – 2016-06-12 (×10): 1 mg via ORAL
  Filled 2016-06-10 (×10): qty 2

## 2016-06-10 MED ORDER — HYDROMORPHONE HCL 1 MG/ML IJ SOLN
0.5000 mg | Freq: Once | INTRAMUSCULAR | Status: AC
  Administered 2016-06-10: 0.5 mg via INTRAVENOUS
  Filled 2016-06-10: qty 1

## 2016-06-10 MED ORDER — LEVOTHYROXINE SODIUM 50 MCG PO TABS
50.0000 ug | ORAL_TABLET | Freq: Every day | ORAL | Status: DC
  Administered 2016-06-10 – 2016-06-13 (×4): 50 ug via ORAL
  Filled 2016-06-10 (×4): qty 1

## 2016-06-10 MED ORDER — PANTOPRAZOLE SODIUM 40 MG PO TBEC
40.0000 mg | DELAYED_RELEASE_TABLET | Freq: Every day | ORAL | Status: DC
  Administered 2016-06-10 – 2016-06-13 (×4): 40 mg via ORAL
  Filled 2016-06-10 (×4): qty 1

## 2016-06-10 MED ORDER — CLOTRIMAZOLE 1 % VA CREA: 1.0000 | TOPICAL_CREAM | Freq: Every day | VAGINAL | Status: DC

## 2016-06-10 NOTE — Progress Notes (Signed)
[facility] F/U note  75 y/o female admitted by my colleague today for abdominal pain sigmoid diverticulitis. I have reviewed the chart, examined her and discussed the plan with the patient and her husband.   Exam: tender in RLQ  Principal Problem:   Sigmoid diverticulitis/ leukocytosis - cont Cipro and Flagyl IV - clear liquids - pain control  Active Problems:   Diabetes mellitus with neuropathy - ISS    Atrial flutter  - Warfarin- therapeutic - resume Toprol to prevent rapid A-fib  Hypothyroidism - synthroid  Chronic hypoxemic respiratory failure (HCC)   COPD (chronic obstructive pulmonary disease) (HCC) - stable, cont O2, Nebs  Hypotension - SBP 80s- steadily improved to SBP in low 100s - Hold Lasix, Lisinopril- cont Toprol    Chronic systolic CHF (congestive heart failure) (HCC) - EF [id number], Gr 1 diastolic dysfunction, RV EF moderately decreased - ECHO 9/17 - Lasix on hold- slow IVF at 50 cc /hr- she is drinking as well  DM 2 - Metformin on hold- SSI    Pancreatic mass - noted incidentally on CT - will need CT with contrast which I have discussed with the patient and her husband  - she has declined this on this admission and will have it done at a later date.     Cholelithiases - incidental finding- d/w patient  Debbe Odea, MD Pager: [email or web address]

## 2016-06-10 NOTE — Progress Notes (Signed)
ANTICOAGULATION CONSULT NOTE - Follow Up Consult  Pharmacy Consult:  Coumadin Indication:  History of Afib and VTE  Allergies  Allergen Reactions  . Morphine And Related Other (See Comments)    Usually drops her blood pressure  . Cymbalta [Duloxetine Hcl] Other (See Comments)    Nausea, headache and diarrhea   . Amoxicillin Other (See Comments)    Unknown Has patient had a PCN reaction causing immediate rash, facial/tongue/throat swelling, SOB or lightheadedness with hypotension:unsure Has patient had a PCN reaction causing severe rash involving mucus membranes or skin necrosis:unsure Has patient had a PCN reaction that required hospitalization:unsure Has patient had a PCN reaction occurring within the last 10 years:unsure If all of the above answers are "NO", then may proceed with Cephalosporin use.     Marland Kitchen Amoxicillin-Pot Clavulanate Other (See Comments)    Has patient had a PCN reaction causing immediate rash, facial/tongue/throat swelling, SOB or lightheadedness with hypotension:unsure Has patient had a PCN reaction causing severe rash involving mucus membranes or skin necrosis:unsure Has patient had a PCN reaction that required hospitalization:unsure Has patient had a PCN reaction occurring within the last 10 years:unsure If all of the above answers are "NO", then may proceed with Cephalosporin use.    . Citalopram Hydrobromide Itching       . Clonazepam Itching  . Macrobid [Nitrofurantoin Macrocrystal] Nausea And Vomiting  . Penicillins Other (See Comments)    Unknown Has patient had a PCN reaction causing immediate rash, facial/tongue/throat swelling, SOB or lightheadedness with hypotension:unsure Has patient had a PCN reaction causing severe rash involving mucus membranes or skin necrosis:unsure Has patient had a PCN reaction that required hospitalization:unsure Has patient had a PCN reaction occurring within the last 10 years:unsure If all of the above answers are  "NO", then may proceed with Cephalosporin use.      . Pyridium [Phenazopyridine Hcl] Other (See Comments)    headache  . Sertraline Hcl Anxiety  . Wellbutrin [Bupropion] Itching, Rash and Other (See Comments)    Itching, rash, hyper   . Zonegran Other (See Comments)    Unknown   . Zonisamide Other (See Comments)    Unknown     Patient Measurements: Height: 5\' 3"  (160 cm) Weight: 162 lb 8 oz (Sarah.7 kg) IBW/kg (Calculated) : 52.4  Vital Signs: Temp: 98.5 F (36.9 C) (10/08 0651) Temp Source: Oral (10/08 0651) BP: 104/61 (10/08 0651) Pulse Rate: 95 (10/08 0651)  Labs:  Recent Labs  06/08/16 0753 06/09/16 2026 06/10/16 0308  HGB 11.2* 10.3*  --   HCT 35.6* 33.2*  --   PLT 285 287  --   LABPROT  --  23.2* 25.5*  INR  --  2.03 2.28  CREATININE 0.71 0.99 0.70    Estimated Creatinine Clearance: 57.5 mL/min (by C-G formula based on SCr of 0.7 mg/dL).    Assessment: Sarah Mcguire to continue on Coumadin from PTA for history of Afib, DVT and PE.  INR therapeutic; no bleeding reported.    Patient is on Cipro and Flagyl for intra-abdominal infection (?PCN allergy).  These antibiotics could significantly increase the effect of Coumadin.  Coumadin home dose:  2.5mg  Mon/Fri/Sat and 5mg  Tues/Thur/Sun and none on Wed   Goal of Thereapy:  INR 2-3    Plan:  - Empirically reduce Coumadin to 3mg  PO today - Daily PT / INR   Naren Benally D. Mina Marble, PharmD, BCPS Pager:  [phone] 06/10/2016, 9:45 AM

## 2016-06-10 NOTE — H&P (Signed)
History and Physical  Sarah Mcguire [id number] DOB: 1940/11/18 DOA: 06/09/2016  PCP:  Annye Asa, MD   Chief Complaint:  Abdominal pain  History of Present Illness:  Patient is a 75 yo female with history of DVT, HTN, Afib, DMII who came with cc of abdominal pain, started 24 hours ago, in the right lower abdomen, with no diarrhea , nausea or vomiting. Last BM 24 hours ago with no dark/bloody stool. She had a fever today with some chills but no other complaints. She had a prior episode of diverticulitis a few years ago.   Review of Systems:  CONSTITUTIONAL:     No night sweats.  No fatigue.  +fever. +chills. Eyes:                            No visual changes.  No eye pain.  No eye discharge.   ENT:                              No epistaxis.  No sinus pain.  No sore throat.   No congestion. RESPIRATORY:           No cough.  No wheeze.  No hemoptysis.  No dyspnea CARDIOVASCULAR   :  No chest pains.  No palpitations. GASTROINTESTINAL:  +abdominal pain.  No nausea. No vomiting.  No diarrhea. No constipation.  No hematemesis.  No hematochezia.  No melena. GENITOURINARY:      No urgency.  No frequency.  No dysuria.  No hematuria.  No obstructive symptoms.  No discharge.  No pain.  MUSCULOSKELETAL:  No musculoskeletal pain.  No joint swelling.  No arthritis. NEUROLOGICAL:        No confusion.  No weakness. No headache. No seizure. PSYCHIATRIC:             No depression. No anxiety. No suicidal ideation. SKIN:                             No rashes.  No lesions.  No wounds. ENDOCRINE:                No weight loss.  No polydipsia.  No polyuria.  No polyphagia. HEMATOLOGIC:           No purpura.  No petechiae.  No bleeding.  ALLERGIC                 : No pruritus.  No angioedema Other:  Past Medical and Surgical History:   Past Medical History:  Diagnosis Date  . Anemia   . Anxiety    SEVERE  . Arthritis    "probably in my back" (05/08/2016)  . Atrial fibrillation with  RVR ([place]) 11/28/2012  . Chronic lower back pain   . Diverticulitis   . DVT (deep venous thrombosis) ([place])   . Emphysema of lung ([place])   . GERD (gastroesophageal reflux disease)   . High cholesterol   . Hyperglycemia   . Hyperlipidemia   . Hypertension   . Leukocytosis   . Neuropathy ([place])   . On home oxygen therapy    "2L; 24/7 prn" (05/08/2016)  . PE (pulmonary embolism)    Bilateral PE, stringy saddle embolus with predominant proximal lower lobe pulmonary artery involvement 06/26/12  . Type II diabetes mellitus ([place])    Past Surgical History:  Procedure  Laterality Date  . CARDIAC CATHETERIZATION N/A 05/30/2016   Procedure: Left Heart Cath and Coronary Angiography;  Surgeon: Peter M Martinique, MD;  Location: [facility];  Service: Cardiovascular;  Laterality: N/A;  . CATARACT EXTRACTION W/ INTRAOCULAR LENS  IMPLANT, BILATERAL Bilateral 08/2013  . JOINT REPLACEMENT    . TONSILLECTOMY    . TOTAL KNEE ARTHROPLASTY Left 11/24/2012   Procedure: TOTAL KNEE ARTHROPLASTY;  Surgeon: Kerin Salen, MD;  Location: [facility];  Service: Orthopedics;  Laterality: Left;  . TYMPANOSTOMY TUBE PLACEMENT Right early 2000s    Social History:   reports that she quit smoking about 2 months ago. Her smoking use included Cigarettes. She has a 30.00 pack-year smoking history. She has never used smokeless tobacco. She reports that she does not drink alcohol or use drugs.    Allergies  Allergen Reactions  . Morphine And Related Other (See Comments)    Usually drops her blood pressure  . Cymbalta [Duloxetine Hcl] Other (See Comments)    Nausea, headache and diarrhea   . Amoxicillin Other (See Comments)    Unknown Has patient had a PCN reaction causing immediate rash, facial/tongue/throat swelling, SOB or lightheadedness with hypotension:unsure Has patient had a PCN reaction causing severe rash involving mucus membranes or skin necrosis:unsure Has patient had a PCN reaction that required  hospitalization:unsure Has patient had a PCN reaction occurring within the last 10 years:unsure If all of the above answers are "NO", then may proceed with Cephalosporin use.     Marland Kitchen Amoxicillin-Pot Clavulanate Other (See Comments)    Has patient had a PCN reaction causing immediate rash, facial/tongue/throat swelling, SOB or lightheadedness with hypotension:unsure Has patient had a PCN reaction causing severe rash involving mucus membranes or skin necrosis:unsure Has patient had a PCN reaction that required hospitalization:unsure Has patient had a PCN reaction occurring within the last 10 years:unsure If all of the above answers are "NO", then may proceed with Cephalosporin use.    . Citalopram Hydrobromide Itching       . Clonazepam Itching  . Macrobid [Nitrofurantoin Macrocrystal] Nausea And Vomiting  . Penicillins Other (See Comments)    Unknown Has patient had a PCN reaction causing immediate rash, facial/tongue/throat swelling, SOB or lightheadedness with hypotension:unsure Has patient had a PCN reaction causing severe rash involving mucus membranes or skin necrosis:unsure Has patient had a PCN reaction that required hospitalization:unsure Has patient had a PCN reaction occurring within the last 10 years:unsure If all of the above answers are "NO", then may proceed with Cephalosporin use.      . Pyridium [Phenazopyridine Hcl] Other (See Comments)    headache  . Sertraline Hcl Anxiety  . Wellbutrin [Bupropion] Itching, Rash and Other (See Comments)    Itching, rash, hyper   . Zonegran Other (See Comments)    Unknown   . Zonisamide Other (See Comments)    Unknown     Family History  Problem Relation Age of Onset  . Diabetes Mother   . Hypertension Mother   . Heart disease Father     MI age 72's   . Diabetes Brother   . Hypertension Brother       Prior to Admission medications   Medication Sig Start Date End Date Taking? Authorizing Provider  acetaminophen  (TYLENOL) 325 MG tablet Take 325-650 mg by mouth every 6 (six) hours as needed (for headache).    Yes Historical Provider, MD  albuterol (PROVENTIL HFA;VENTOLIN HFA) 108 (90 Base) MCG/ACT inhaler Inhale 2 puffs into  the lungs every 6 (six) hours as needed for wheezing or shortness of breath. 04/17/16  Yes Tammy S Parrett, NP  alprazolam (XANAX) 2 MG tablet TAKE 1/2 TO 1 TABLET BY MOUTH 3 TIMES A DAY AS NEEDED FOR SLEEP OR ANXIETY Patient taking differently: TAKE 1 TABLET BY MOUTH THREE TIMES DAILY 05/11/16  Yes Midge Minium, MD  calcium citrate-vitamin D (CITRACAL+D) 315-200 MG-UNIT tablet Take 2 tablets by mouth daily.   Yes Historical Provider, MD  Cholecalciferol (VITAMIN D) 2000 UNITS tablet Take 2,000 Units by mouth daily.   Yes Historical Provider, MD  esomeprazole (NEXIUM) 40 MG capsule Take 1 capsule (40 mg total) by mouth daily as needed. For acid reflux. 05/24/15  Yes Midge Minium, MD  fenofibrate 160 MG tablet Take 1 tablet (160 mg total) by mouth daily. 03/20/16  Yes Midge Minium, MD  Fluticasone Furoate-Vilanterol (BREO ELLIPTA IN) Inhale 1 puff into the lungs daily.   Yes Historical Provider, MD  folic acid (FOLVITE) 1 MG tablet Take 1 tablet (1 mg total) by mouth daily. 05/31/14  Yes Midge Minium, MD  furosemide (LASIX) 40 MG tablet TAKE 1 TABLET BY MOUTH EVERY DAY Patient taking differently: Take 20 mg by mouth every day 04/23/16  Yes Midge Minium, MD  HYDROcodone-acetaminophen (NORCO/VICODIN) 5-325 MG tablet Take 1-2 tablets by mouth every 4 (four) hours as needed. Patient taking differently: Take 1-2 tablets by mouth every 4 (four) hours as needed for moderate pain.  06/04/16  Yes Midge Minium, MD  levothyroxine (SYNTHROID, LEVOTHROID) 50 MCG tablet Take 1 tablet (50 mcg total) by mouth daily. Patient taking differently: Take 50 mcg by mouth daily before breakfast.  12/19/15  Yes Midge Minium, MD  lisinopril (PRINIVIL,ZESTRIL) 10 MG tablet TAKE 1  TABLET BY MOUTH EVERY DAY Patient taking differently: Take 10 mg by mouth every day 05/28/16  Yes Midge Minium, MD  meloxicam (MOBIC) 7.5 MG tablet Take 7.5 mg by mouth daily as needed for pain.   Yes Historical Provider, MD  metFORMIN (GLUCOPHAGE-XR) 500 MG 24 hr tablet Take 500-1,000 mg by mouth See admin instructions. Pt takes 1000mg  in morning after breakfast, 500mg  in evening after dinner   Yes Historical Provider, MD  methocarbamol (ROBAXIN) 500 MG tablet Take 500 mg by mouth 3 (three) times daily.   Yes Historical Provider, MD  metoprolol succinate (TOPROL XL) 25 MG 24 hr tablet Take 1 tablet (25 mg total) by mouth daily. 05/22/16  Yes Eileen Stanford, PA-C  Multiple Vitamin (MULTIVITAMIN) tablet Take 1 tablet by mouth daily.     Yes Historical Provider, MD  sertraline (ZOLOFT) 25 MG tablet Take 1 tablet (25 mg total) by mouth at bedtime. 06/04/16  Yes Midge Minium, MD  simvastatin (ZOCOR) 20 MG tablet TAKE 1 TABLET BY MOUTH EVERY EVENING Patient taking differently: Take 20 mg by mouth every evening 05/28/16  Yes Midge Minium, MD  terconazole (TERAZOL 7) 0.4 % vaginal cream Place 1 applicator vaginally at bedtime as needed (itching).   Yes Historical Provider, MD  traMADol (ULTRAM) 50 MG tablet Take 50 mg by mouth every 6 (six) hours as needed for moderate pain.   Yes Historical Provider, MD  warfarin (COUMADIN) 5 MG tablet Take 2.5-5 mg by mouth See admin instructions. Pt takes 2.5mg  on Monday, Friday, Saturday - pt takes 5mg  Tuesday, Thursday, Sunday   Yes Historical Provider, MD  clobetasol cream (TEMOVATE) 0.05 % APPLY EXTERNALLY AT BEDTIME FOR ONE MONTH  THEN AS NEEDED FOR ITCHING Patient not taking: Reported on 06/09/2016 02/24/16   Anastasio Auerbach, MD  glucose blood (ONE TOUCH ULTRA TEST) test strip TEST TWICE A DAY AS DIRECTED 12/19/15   Midge Minium, MD  lidocaine (LIDODERM) 5 % Place 1 patch onto the skin daily as needed (for pain.). Remove & Discard patch  within 12 hours or as directed by MD    Historical Provider, MD  lidocaine (XYLOCAINE) 5 % ointment APPLY 1 APPLICATION TOPICALLY 3 (THREE) TIMES DAILY AS NEEDED. FOR ITCHING DIRECTLY TO THE SPOT Patient not taking: Reported on 06/09/2016 02/24/16   Anastasio Auerbach, MD  naproxen (NAPROSYN) 500 MG tablet Take 500 mg by mouth 2 (two) times daily as needed for mild pain.    Historical Provider, MD  ranitidine (ZANTAC) 150 MG tablet Take 1 tablet (150 mg total) by mouth 2 (two) times daily as needed for heartburn. Patient not taking: Reported on 06/09/2016 06/08/16   Leo Grosser, MD    Physical Exam: BP (!) 86/65   Pulse 96   Temp 97.8 F (36.6 C) (Oral)   Resp 23   LMP  (LMP Unknown)   SpO2 100%   GENERAL :   Alert and cooperative, and appears to be in no acute distress. HEAD:           normocephalic. EYES:            PERRL, EOMI.  vision is grossly intact. EARS:           hearing grossly intact. NOSE:           No nasal discharge. THROAT:     Oral cavity and pharynx normal.   NECK:          supple CARDIAC:    Normal S1 and S2. No gallop. No murmurs.  Vascular:     no peripheral edema.  LUNGS:       Clear to auscultation  ABDOMEN: Positive bowel sounds. Soft, nondistended, nontender ( just got dilaudid). No guarding or rebound.      MSK:           No joint erythema or tenderness. Normal muscular development. EXT           : No significant deformity or joint abnormality. Neuro        : Alert, oriented to person, place, and time.  SKIN:            No rash. No lesions. PSYCH:       No hallucination. Patient is not suicidal.          Labs on Admission:  Reviewed.   Radiological Exams on Admission: Ct Abdomen Pelvis Wo Contrast  Result Date: 06/09/2016 CLINICAL DATA:  Severe right-sided pain. EXAM: CT ABDOMEN AND PELVIS WITHOUT CONTRAST TECHNIQUE: Multidetector CT imaging of the abdomen and pelvis was performed following the standard protocol without IV contrast. COMPARISON:  None.  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 No other significant abnormalities in the lower chest. Hepatobiliary: Cholelithiasis is identified with mild distention of the gallbladder but no wall thickening or pericholecystic fluid. The liver is unremarkable. Pancreas: The pancreatic head, neck, and body are normal. There is abnormal soft tissue attenuation in the region of the pancreatic tail which is new since July of 2012. This masslike finding is between the stomach and spleen measuring 5.2 x 3.6 cm on image 25 with mild adjacent fat stranding and an attenuation of  28 Hounsfield units. This finding is new since 2012. Spleen: Normal in size without focal abnormality. Adrenals/Urinary Tract: Nodularity in the left adrenal gland is stable. The right adrenal gland is normal. There is a stone in the lower left kidney which is stable. No hydronephrosis. No ureterectasis or ureteral stones. Stomach/Bowel: The bladder is normal in appearance. The small bowel is unremarkable. Colonic diverticuli are identified. There is significant stranding around the sigmoid colon with associated colonic wall thickening. These findings are consistent with diverticulitis. Evaluation for abscess is limited without contrast but none are seen. No extraluminal gas identified. The remainder of the colon is unremarkable. The visualized appendix is normal as well. Vascular/Lymphatic: Atherosclerosis is seen in the aorta as well as the iliac and femoral vessels. No aneurysm. No adenopathy. Reproductive: Uterus and bilateral adnexa are unremarkable. Other: No other abnormalities. Musculoskeletal: No acute bony abnormalities. Grade 1 anterolisthesis of L4 versus L5 is stable. IMPRESSION: 1. Sigmoid diverticulitis. 2. 5.2 x 3.6 cm masslike abnormality in the region of the pancreatic tail, between the spleen and the stomach. This is not well assessed without contrast. It is possible this could represent a complex  fluid collection including a complex pseudocyst. However, a neoplasm is not excluded. Recommend clinical correlation. Recommend a CT scan with intravenous contrast for better assessment. 3. Cholelithiasis. 4. Atherosclerosis in the abdominal aorta. Electronically Signed   By: Dorise Bullion III M.D   On: 06/09/2016 22:26   US Abdomen Limited  Result Date: 06/10/2016 CLINICAL DATA:  Right-sided abdominal pain. EXAM: US ABDOMEN LIMITED - RIGHT UPPER QUADRANT COMPARISON:  CT scan from earlier today FINDINGS: Gallbladder: Shadowing stones are seen in the gallbladder with no Murphy's sign, wall thickening, or pericholecystic fluid. Common bile duct: Diameter: 6.8 mm Liver: No focal lesion identified. Within normal limits in parenchymal echogenicity. IMPRESSION: 1. Cholelithiasis with no wall thickening, pericholecystic fluid, or Murphy's sign. 2. The common bile duct measures 6.8 mm which is prominent but within normal limits for a patient of this age. Recommend correlation with labs. Electronically Signed   By: Dorise Bullion III M.D   On: 06/10/2016 00:22   Dg Chest Port 1 View  Result Date: 06/09/2016 CLINICAL DATA:  Acute onset of generalized chest pain and right lower quadrant abdominal pain. Initial encounter. EXAM: PORTABLE CHEST 1 VIEW COMPARISON:  Chest radiograph performed 05/07/2016, and CTA of the chest performed 03/19/2016 FINDINGS: The lungs are well-aerated. Minimal bibasilar atelectasis is noted. There is no evidence of pleural effusion or pneumothorax. The cardiomediastinal silhouette is mildly enlarged. No acute osseous abnormalities are seen. IMPRESSION: Minimal bibasilar atelectasis noted. Lungs otherwise clear. Mild cardiomegaly. Electronically Signed   By: Garald Balding M.D.   On: 06/09/2016 21:18     Assessment/Plan  Diverticulitis: Started on cipro/flagyl with pharmacy dosing NPO overnight Advance diet tomorrow  DMII: on low dose correction with hypoglycemic protocol.    HTN: hold BP meds  DVT: continue coumadin. INR therapeutic   Afib: CHADS =>2 , continue wafarin, holding BB for rate control due to lower BP on admission.  COPD: continue home meds  Input & Output: NA Lines & Tubes: PIV DVT prophylaxis: on AC GI prophylaxis: PPI Consultants: na Code Status: full Family Communication: at bedside   Disposition Plan: Obs    Gennaro Africa M.D [facility]

## 2016-06-10 NOTE — Progress Notes (Signed)
ANTICOAGULATION CONSULT NOTE - Initial Consult  Pharmacy Consult for Coumadin Indication: h/o DVT and afib  Allergies  Allergen Reactions  . Morphine And Related Other (See Comments)    Usually drops her blood pressure  . Cymbalta [Duloxetine Hcl] Other (See Comments)    Nausea, headache and diarrhea   . Amoxicillin Other (See Comments)    Unknown Has patient had a PCN reaction causing immediate rash, facial/tongue/throat swelling, SOB or lightheadedness with hypotension:unsure Has patient had a PCN reaction causing severe rash involving mucus membranes or skin necrosis:unsure Has patient had a PCN reaction that required hospitalization:unsure Has patient had a PCN reaction occurring within the last 10 years:unsure If all of the above answers are "NO", then may proceed with Cephalosporin use.     Marland Kitchen Amoxicillin-Pot Clavulanate Other (See Comments)    Has patient had a PCN reaction causing immediate rash, facial/tongue/throat swelling, SOB or lightheadedness with hypotension:unsure Has patient had a PCN reaction causing severe rash involving mucus membranes or skin necrosis:unsure Has patient had a PCN reaction that required hospitalization:unsure Has patient had a PCN reaction occurring within the last 10 years:unsure If all of the above answers are "NO", then may proceed with Cephalosporin use.    . Citalopram Hydrobromide Itching       . Clonazepam Itching  . Macrobid [Nitrofurantoin Macrocrystal] Nausea And Vomiting  . Penicillins Other (See Comments)    Unknown Has patient had a PCN reaction causing immediate rash, facial/tongue/throat swelling, SOB or lightheadedness with hypotension:unsure Has patient had a PCN reaction causing severe rash involving mucus membranes or skin necrosis:unsure Has patient had a PCN reaction that required hospitalization:unsure Has patient had a PCN reaction occurring within the last 10 years:unsure If all of the above answers are "NO", then  may proceed with Cephalosporin use.      . Pyridium [Phenazopyridine Hcl] Other (See Comments)    headache  . Sertraline Hcl Anxiety  . Wellbutrin [Bupropion] Itching, Rash and Other (See Comments)    Itching, rash, hyper   . Zonegran Other (See Comments)    Unknown   . Zonisamide Other (See Comments)    Unknown     Patient Measurements:    Vital Signs: Temp: 97.8 F (36.6 C) (10/07 2015) Temp Source: Oral (10/07 2015) BP: 86/65 (10/07 2300) Pulse Rate: 96 (10/07 2230)  Labs:  Recent Labs  06/08/16 0753 06/09/16 2026  HGB 11.2* 10.3*  HCT 35.6* 33.2*  PLT 285 287  LABPROT  --  23.2*  INR  --  2.03  CREATININE 0.71 0.99    Estimated Creatinine Clearance: 46.4 mL/min (by C-G formula based on SCr of 0.99 mg/dL).   Medical History: Past Medical History:  Diagnosis Date  . Anemia   . Anxiety    SEVERE  . Arthritis    "probably in my back" (05/08/2016)  . Atrial fibrillation with RVR ([place]) 11/28/2012  . Chronic lower back pain   . Diverticulitis   . DVT (deep venous thrombosis) ([place])   . Emphysema of lung ([place])   . GERD (gastroesophageal reflux disease)   . High cholesterol   . Hyperglycemia   . Hyperlipidemia   . Hypertension   . Leukocytosis   . Neuropathy ([place])   . On home oxygen therapy    "2L; 24/7 prn" (05/08/2016)  . PE (pulmonary embolism)    Bilateral PE, stringy saddle embolus with predominant proximal lower lobe pulmonary artery involvement 06/26/12  . Type II diabetes mellitus ([place])  Medications:  See electronic med rec  Assessment: 75 y.o. F presents with possible intra-abd infection. Pt on coumadin PTA for h/o DVT and afib. Admission INR therapeutic (2.03). Hgb down to 10.3, plt ok. Home dose: 2.5mg  Mon/Fri/Sat and 5mg  Tues/Thur/Sun and none on Wed  Pt now on ciprofloxacin and flagyl which will likely cause increased INR  Goal of Therapy:  INR 2-3 Monitor platelets by anticoagulation protocol: Yes   Plan:  Daily INR  Sherlon Handing, PharmD, BCPS Clinical pharmacist, pager [phone] 06/10/2016,12:40 AM

## 2016-06-10 NOTE — ED Notes (Addendum)
Patient transported to [place] at this time via ED stretcher. Pt in no apparent distress at this time.

## 2016-06-10 NOTE — Progress Notes (Signed)
Pharmacy Antibiotic Note  Sarah Mcguire is a 75 y.o. female admitted on 06/09/2016 with intra abdominal infection. Admitting MD changing abx to Flagyl, and Ciprofloxacin per pharmacy.  Plan: Flagyl 500 mg IV q8h Ciprofloxacin 400mg  IV q12h Monitor renal fx and pt's micro data    Temp (24hrs), Avg:97.8 F (36.6 C), Min:97.8 F (36.6 C), Max:97.8 F (36.6 C)   Recent Labs Lab 06/08/16 0753 06/08/16 0807 06/09/16 2026 06/09/16 2102  WBC 9.6  --  15.1*  --   CREATININE 0.71  --  0.99  --   LATICACIDVEN  --  1.39  --  1.66    Estimated Creatinine Clearance: 46.4 mL/min (by C-G formula based on SCr of 0.99 mg/dL).    Sherlon Handing, PharmD, BCPS Clinical pharmacist, pager [phone] 06/10/2016 12:36 AM

## 2016-06-10 NOTE — Progress Notes (Signed)
New Admission Note:   Arrival Method: Via stretcher from ED Mental Orientation:  A & O x 4 - very anxious and difficult to keep patient calm. Telemetry:   N/A Assessment: Completed Skin: Intact IV:  See flowsheet Pain:  Tubes:  None Safety Measures: Safety Fall Prevention Plan has been given, discussed and signed Admission: Completed [facility] [place] Orientation: Patient has been orientated to the room, unit and staff.  Family:  Husband at bedside  Orders have been reviewed and implemented. Will continue to monitor the patient. Call light has been placed within reach and bed alarm has been activated.   Earleen Reaper RN- [place], [place] Phone number: [phone]

## 2016-06-10 NOTE — ED Notes (Signed)
Report called to Carlota Raspberry, RN at this time.  Receiving nurse denies having any further questions at this time.

## 2016-06-11 ENCOUNTER — Other Ambulatory Visit: Payer: Self-pay | Admitting: Hematology

## 2016-06-11 ENCOUNTER — Inpatient Hospital Stay (HOSPITAL_COMMUNITY): Payer: Medicare Other

## 2016-06-11 DIAGNOSIS — K5792 Diverticulitis of intestine, part unspecified, without perforation or abscess without bleeding: Secondary | ICD-10-CM

## 2016-06-11 DIAGNOSIS — I950 Idiopathic hypotension: Secondary | ICD-10-CM

## 2016-06-11 DIAGNOSIS — E034 Atrophy of thyroid (acquired): Secondary | ICD-10-CM

## 2016-06-11 DIAGNOSIS — Z86718 Personal history of other venous thrombosis and embolism: Secondary | ICD-10-CM

## 2016-06-11 DIAGNOSIS — K5713 Diverticulitis of small intestine without perforation or abscess with bleeding: Secondary | ICD-10-CM

## 2016-06-11 DIAGNOSIS — E119 Type 2 diabetes mellitus without complications: Secondary | ICD-10-CM

## 2016-06-11 DIAGNOSIS — I10 Essential (primary) hypertension: Secondary | ICD-10-CM

## 2016-06-11 DIAGNOSIS — I4891 Unspecified atrial fibrillation: Secondary | ICD-10-CM

## 2016-06-11 DIAGNOSIS — Z7901 Long term (current) use of anticoagulants: Secondary | ICD-10-CM

## 2016-06-11 DIAGNOSIS — K869 Disease of pancreas, unspecified: Secondary | ICD-10-CM

## 2016-06-11 LAB — URINE CULTURE

## 2016-06-11 LAB — CBC
HEMATOCRIT: 27.9 % — AB (ref 36.0–46.0)
HEMOGLOBIN: 8.4 g/dL — AB (ref 12.0–15.0)
MCH: 26.3 pg (ref 26.0–34.0)
MCHC: 30.1 g/dL (ref 30.0–36.0)
MCV: 87.5 fL (ref 78.0–100.0)
Platelets: 229 10*3/uL (ref 150–400)
RBC: 3.19 MIL/uL — AB (ref 3.87–5.11)
RDW: 17.2 % — ABNORMAL HIGH (ref 11.5–15.5)
WBC: 6.9 10*3/uL (ref 4.0–10.5)

## 2016-06-11 LAB — RETICULOCYTES
RBC.: 3.54 MIL/uL — ABNORMAL LOW (ref 3.87–5.11)
RETIC COUNT ABSOLUTE: 67.3 10*3/uL (ref 19.0–186.0)
Retic Ct Pct: 1.9 % (ref 0.4–3.1)

## 2016-06-11 LAB — GLUCOSE, CAPILLARY
GLUCOSE-CAPILLARY: 141 mg/dL — AB (ref 65–99)
Glucose-Capillary: 158 mg/dL — ABNORMAL HIGH (ref 65–99)
Glucose-Capillary: 135 mg/dL — ABNORMAL HIGH (ref 65–99)
Glucose-Capillary: 128 mg/dL — ABNORMAL HIGH (ref 65–99)

## 2016-06-11 LAB — IRON AND TIBC
IRON: 17 ug/dL — AB (ref 28–170)
SATURATION RATIOS: 5 % — AB (ref 10.4–31.8)
TIBC: 315 ug/dL (ref 250–450)
UIBC: 298 ug/dL

## 2016-06-11 LAB — PROTIME-INR
INR: 2.95
PROTHROMBIN TIME: 31.3 s — AB (ref 11.4–15.2)

## 2016-06-11 LAB — FOLATE: FOLATE: 55.2 ng/mL (ref >5.9)

## 2016-06-11 LAB — FERRITIN: Ferritin: 63 ng/mL (ref 11–307)

## 2016-06-11 LAB — VITAMIN B12: Vitamin B-12: 881 pg/mL (ref 180–914)

## 2016-06-11 MED ORDER — IOPAMIDOL (ISOVUE-370) INJECTION 76%
INTRAVENOUS | Status: AC
  Administered 2016-06-11: 100 mL
  Filled 2016-06-11: qty 100

## 2016-06-11 MED ORDER — VITAMIN K1 10 MG/ML IJ SOLN
1.0000 mg | Freq: Once | INTRAVENOUS | Status: AC
  Administered 2016-06-11: 1 mg via INTRAVENOUS
  Filled 2016-06-11: qty 0.1

## 2016-06-11 MED ORDER — WARFARIN 0.5 MG HALF TABLET
0.5000 mg | ORAL_TABLET | Freq: Once | ORAL | Status: DC
  Filled 2016-06-11: qty 1

## 2016-06-11 NOTE — Progress Notes (Signed)
Pt is voiding small, frequent amounts. Bladder scan shows greater than 1000 cc urine. In and out cath done with 1200 cc urine returned. Pt seems more comfortable and less restless. Dr. Wynelle Cleveland notified.

## 2016-06-11 NOTE — Consult Note (Signed)
[place]  Telephone:(336) [place]  DOB: [birth year]  MR#: [id number]  CSN#: [id number]    Requesting Physician: [facility] Dr. Wynelle Cleveland   Patient Care Team: Midge Minium, MD as PCP - General Josue Hector, MD as Consulting Physician (Cardiology)  Reason for consult: Pancreatic tail mass  History of present illness:   74 year old occasional female with past medical history of hypertension, atrial fibrillation, type 2 diabetes, history of DVT, anxiety, who presented with right lower abdominal pain, nausea, and fever, was admitted to hospital for diverticulitis. CT of abdomen and pelvis revealed a large mass in the pancreatic tail, with direct imaging to spleen and left adrenal gland, and abutting the stomach. I was consulted for presumed pancreatic cancer.  She lives with her husband. She had knee surgery a few years ago, and has had significant knee pain since the surgery, walks with a walker, not physically active. She also reports fatigue, anorexia and intermittent nausea for the past month. She has chronic low back pain, which has not changed. She denies any significant epigastric pain, melena, or diarrhea.  MEDICAL HISTORY:  Past Medical History:  Diagnosis Date  . Anemia   . Anxiety    SEVERE  . Arthritis    "probably in my back" (05/08/2016)  . Atrial fibrillation with RVR ([place]) 11/28/2012  . Chronic lower back pain   . Diverticulitis   . DVT (deep venous thrombosis) ([place])   . Emphysema of lung ([place])   . GERD (gastroesophageal reflux disease)   . High cholesterol   . Hyperglycemia   . Hyperlipidemia   . Hypertension   . Leukocytosis   . Neuropathy ([place])   . On home oxygen therapy    "2L; 24/7 prn" (05/08/2016)  . PE (pulmonary embolism)    Bilateral PE, stringy saddle embolus with predominant proximal lower lobe pulmonary artery involvement 06/26/12  . Type II diabetes mellitus  ([place])     SURGICAL HISTORY: Past Surgical History:  Procedure Laterality Date  . CARDIAC CATHETERIZATION N/A 05/30/2016   Procedure: Left Heart Cath and Coronary Angiography;  Surgeon: Peter M Martinique, MD;  Location: [facility];  Service: Cardiovascular;  Laterality: N/A;  . CATARACT EXTRACTION W/ INTRAOCULAR LENS  IMPLANT, BILATERAL Bilateral 08/2013  . JOINT REPLACEMENT    . TONSILLECTOMY    . TOTAL KNEE ARTHROPLASTY Left 11/24/2012   Procedure: TOTAL KNEE ARTHROPLASTY;  Surgeon: Kerin Salen, MD;  Location: [facility];  Service: Orthopedics;  Laterality: Left;  . TYMPANOSTOMY TUBE PLACEMENT Right early 2000s    SOCIAL HISTORY: Social History   Social History  . Marital status: Married    Spouse name: N/A  . Number of children: N/A  . Years of education: N/A   Occupational History  . Not on file.   Social History Main Topics  . Smoking status: Former Smoker    Packs/day: 0.50    Years: 60.00    Types: Cigarettes    Quit date: 03/18/2016  . Smokeless tobacco: Never Used  . Alcohol use No  . Drug use: No  . Sexual activity: Yes   Other Topics Concern  . Not on file   Social History Narrative  . No narrative on file    FAMILY HISTORY: Family History  Problem Relation Age of Onset  . Diabetes Mother   . Hypertension Mother   . Heart disease Father     MI age  22's   . Diabetes Brother   . Hypertension Brother     ALLERGIES:  is allergic to morphine and related; cymbalta [duloxetine hcl]; amoxicillin; amoxicillin-pot clavulanate; citalopram hydrobromide; clonazepam; macrobid [nitrofurantoin macrocrystal]; penicillins; pyridium [phenazopyridine hcl]; sertraline hcl; wellbutrin [bupropion]; zonegran; and zonisamide.  MEDICATIONS:  Current Facility-Administered Medications  Medication Dose Route Frequency Provider Last Rate Last Dose  . albuterol (PROVENTIL) (2.5 MG/3ML) 0.083% nebulizer solution 3 mL  3 mL Inhalation Q6H PRN Gennaro Africa, MD      . ALPRAZolam  Duanne Moron) tablet 1 mg  1 mg Oral Q4H PRN Debbe Odea, MD   1 mg at 06/11/16 1347  . ciprofloxacin (CIPRO) IVPB 400 mg  400 mg Intravenous Q12H Franky Macho, RPH   400 mg at 06/11/16 0948  . clotrimazole (GYNE-LOTRIMIN) vaginal cream 1 Applicatorful  1 Applicatorful Vaginal QHS Gennaro Africa, MD      . fenofibrate tablet 160 mg  160 mg Oral Daily Gennaro Africa, MD   160 mg at 06/11/16 0857  . fluticasone furoate-vilanterol (BREO ELLIPTA) 100-25 MCG/INH 1 puff  1 puff Inhalation Daily Gennaro Africa, MD   1 puff at 06/11/16 0737  . folic acid (FOLVITE) tablet 1 mg  1 mg Oral Daily Gennaro Africa, MD   1 mg at 06/11/16 0856  . HYDROcodone-acetaminophen (NORCO/VICODIN) 5-325 MG per tablet 1 tablet  1 tablet Oral Q4H PRN Debbe Odea, MD   1 tablet at 06/11/16 1347  . HYDROmorphone (DILAUDID) injection 0.5 mg  0.5 mg Intravenous Once Lajean Saver, MD   Stopped at 06/09/16 2324  . HYDROmorphone (DILAUDID) injection 0.5 mg  0.5 mg Intravenous Q4H PRN Debbe Odea, MD   0.5 mg at 06/11/16 0855  . Influenza vac split quadrivalent PF (FLUARIX) injection 0.5 mL  0.5 mL Intramuscular Once Debbe Odea, MD      . insulin aspart (novoLOG) injection 0-5 Units  0-5 Units Subcutaneous QHS Saima Rizwan, MD      . insulin aspart (novoLOG) injection 0-9 Units  0-9 Units Subcutaneous TID WC Debbe Odea, MD   1 Units at 06/11/16 1200  . levothyroxine (SYNTHROID, LEVOTHROID) tablet 50 mcg  50 mcg Oral QAC breakfast Gennaro Africa, MD   50 mcg at 06/11/16 0745  . methocarbamol (ROBAXIN) tablet 500 mg  500 mg Oral TID Gennaro Africa, MD      . metoprolol succinate (TOPROL-XL) 24 hr tablet 25 mg  25 mg Oral Daily Debbe Odea, MD   25 mg at 06/11/16 0857  . metroNIDAZOLE (FLAGYL) IVPB 500 mg  500 mg Intravenous Q8H Wynell Balloon, RPH 100 mL/hr at 06/11/16 0539 500 mg at 06/11/16 0539  . pantoprazole (PROTONIX) EC tablet 40 mg  40 mg Oral Daily Gennaro Africa, MD   40 mg at 06/11/16 0857  . sertraline (ZOLOFT) tablet 25 mg  25 mg Oral QHS  Gennaro Africa, MD   25 mg at 06/10/16 2115  . simvastatin (ZOCOR) tablet 20 mg  20 mg Oral QPM Gennaro Africa, MD   20 mg at 06/10/16 1714  . warfarin (COUMADIN) tablet 0.5 mg  0.5 mg Oral ONCE-1800 Debbe Odea, MD      . Warfarin - Pharmacist Dosing Inpatient   Does not apply q1800 Tyrone Apple, RPH        REVIEW OF SYSTEMS:   Constitutional: Denies fevers, chills or abnormal night sweats Eyes: Denies blurriness of vision, double vision or watery eyes Ears, nose, mouth, throat, and face: Denies mucositis or sore throat Respiratory: Denies  cough, dyspnea or wheezes Cardiovascular: Denies palpitation, chest discomfort or lower extremity swelling Gastrointestinal:  Denies nausea, heartburn or change in bowel habits Skin: Denies abnormal skin rashes Lymphatics: Denies new lymphadenopathy or easy bruising Neurological:Denies numbness, tingling or new weaknesses Behavioral/Psych: Mood is stable, no new changes  All other systems were reviewed with the patient and are negative.  PHYSICAL EXAMINATION: ECOG PERFORMANCE STATUS: 3 - Symptomatic, >50% confined to bed  Vitals:   06/10/16 2044 06/11/16 0513  BP: 111/80 114/85  Pulse: (!) 101 (!) 104  Resp: 16 18  Temp: 98.6 F (37 C) 97.7 F (36.5 C)   Filed Weights   06/10/16 0233  Weight: 162 lb 8 oz (73.7 kg)    GENERAL:alert, no distress, but appears anxious  SKIN: skin color, texture, turgor are normal, no rashes or significant lesions EYES: normal, conjunctiva are pink and non-injected, sclera clear OROPHARYNX:no exudate, no erythema and lips, buccal mucosa, and tongue normal  NECK: supple, thyroid normal size, non-tender, without nodularity LYMPH:  no palpable lymphadenopathy in the cervical, axillary or inguinal LUNGS: clear to auscultation and percussion with normal breathing effort HEART: regular rate & rhythm and no murmurs and no lower extremity edema ABDOMEN:abdomen soft, mild tenderness in the right lower quadrant of abdomen,  gastric tenderness, no organomegaly, and normal bowel sounds Musculoskeletal:no cyanosis of digits and no clubbing  PSYCH: alert & oriented x 3 with fluent speech NEURO: no focal motor/sensory deficits  LABORATORY DATA:  I have reviewed the data as listed Lab Results  Component Value Date   WBC 6.9 06/11/2016   HGB 8.4 (L) 06/11/2016   HCT 27.9 (L) 06/11/2016   MCV 87.5 06/11/2016   PLT 229 06/11/2016    Recent Labs  03/14/16 1130  04/16/16 1457 05/07/16 1447  06/08/16 0753 06/09/16 2026 06/10/16 0308  NA 141  < > 141 135  < > 141 134* 141  K 4.5  < > 4.6 3.9  < > 3.7 3.9 3.6  CL 104  < > 102 102  < > 103 98* 106  CO2 30  < > 30 26  < > 27 24 25   GLUCOSE 84  < > 120* 123*  < > 146* 161* 136*  BUN 19  < > 21 20  < > 11 12 9   CREATININE 0.70  < > 1.07* 0.90  < > 0.71 0.99 0.70  CALCIUM 10.5  < > 9.9 10.4*  < > 10.6* 10.6* 8.9  GFRNONAA  --   < >  --  >60  < > >60 54* >60  GFRAA  --   < >  --  >60  < > >60 >60 >60  PROT 6.7  --  6.2 6.8  --  6.7 6.4* 5.4*  ALBUMIN 4.1  --  3.8 4.0  --  4.2 3.7 3.0*  AST 16  --  16 25  --  20 19 16   ALT 14  --  10 13*  --  13* 12* 12*  ALKPHOS 45  --  57 48  --  48 43 38  BILITOT 0.3  --  0.2 0.2*  --  0.5 0.8 0.7  BILIDIR 0.1  --  0.1 <0.1*  --   --   --   --   IBILI  --   --  0.1* NOT CALCULATED  --   --   --   --   < > = values in this interval not displayed.  RADIOGRAPHIC STUDIES:  I have personally reviewed the radiological images as listed and agreed with the findings in the report. Ct Abdomen Pelvis Wo Contrast  Result Date: 06/09/2016 CLINICAL DATA:  Severe right-sided pain. EXAM: CT ABDOMEN AND PELVIS WITHOUT CONTRAST TECHNIQUE: Multidetector CT imaging of the abdomen and pelvis was performed following the standard protocol without IV contrast. COMPARISON:  None. 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 No other significant abnormalities in the lower chest. Hepatobiliary: Cholelithiasis is  identified with mild distention of the gallbladder but no wall thickening or pericholecystic fluid. The liver is unremarkable. Pancreas: The pancreatic head, neck, and body are normal. There is abnormal soft tissue attenuation in the region of the pancreatic tail which is new since July of 2012. This masslike finding is between the stomach and spleen measuring 5.2 x 3.6 cm on image 25 with mild adjacent fat stranding and an attenuation of 28 Hounsfield units. This finding is new since 2012. Spleen: Normal in size without focal abnormality. Adrenals/Urinary Tract: Nodularity in the left adrenal gland is stable. The right adrenal gland is normal. There is a stone in the lower left kidney which is stable. No hydronephrosis. No ureterectasis or ureteral stones. Stomach/Bowel: The bladder is normal in appearance. The small bowel is unremarkable. Colonic diverticuli are identified. There is significant stranding around the sigmoid colon with associated colonic wall thickening. These findings are consistent with diverticulitis. Evaluation for abscess is limited without contrast but none are seen. No extraluminal gas identified. The remainder of the colon is unremarkable. The visualized appendix is normal as well. Vascular/Lymphatic: Atherosclerosis is seen in the aorta as well as the iliac and femoral vessels. No aneurysm. No adenopathy. Reproductive: Uterus and bilateral adnexa are unremarkable. Other: No other abnormalities. Musculoskeletal: No acute bony abnormalities. Grade 1 anterolisthesis of L4 versus L5 is stable. IMPRESSION: 1. Sigmoid diverticulitis. 2. 5.2 x 3.6 cm masslike abnormality in the region of the pancreatic tail, between the spleen and the stomach. This is not well assessed without contrast. It is possible this could represent a complex fluid collection including a complex pseudocyst. However, a neoplasm is not excluded. Recommend clinical correlation. Recommend a CT scan with intravenous contrast for  better assessment. 3. Cholelithiasis. 4. Atherosclerosis in the abdominal aorta. Electronically Signed   By: Dorise Bullion III M.D   On: 06/09/2016 22:26   Ct Abdomen Pelvis W Contrast  Result Date: 06/11/2016 CLINICAL DATA:  Right lower quadrant abdominal pain. Pancreatic mass. EXAM: CT ABDOMEN AND PELVIS WITH CONTRAST TECHNIQUE: Multidetector CT imaging of the abdomen and pelvis was performed using the standard protocol following bolus administration of intravenous contrast. CONTRAST:  100 cc Isovue 370 COMPARISON:  06/09/2016 CT scan FINDINGS: Lower chest: Trace bilateral pleural effusions with mild atelectasis in both lower lobes. Moderate cardiomegaly. Low-density blood pool. Hepatobiliary: Mildly distended gallbladder, 4.5 cm transverse diameter. Suspected small gallstones dependently in the gallbladder, images 29-32 series 7. No CBD dilatation. I do not see any metastatic lesions in the liver. Pancreas: There is an ill-defined 6.2 by 7.0 by 6.0 cm irregularly marginated hypoenhancing mass most closely associated with the tail the pancreas, invading the splenic hilum, surrounding the splenic artery, and abutting and conceivably invading the elder layers of the gastric wall, images 20 through 41 series 3. 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 The  mass extends to the margin of the left kidney and not image 78/12 but does not appear to invade into the renal parenchyma as best I can tell. Irregular extension of tumor along the margins of the mass. The rest of the pancreas is atrophic. Spleen: The lower spleen is invaded by the pancreatic mass. Adrenals/Urinary Tract: The left adrenal gland appears involved with the pancreatic mass, thickened and directly tangential to the mass. Stomach/Bowel: The mass abuts and may be invading the gastric  fundus. There is considerable active sigmoid diverticulitis with extensive local inflammation, stranding in adipose tissues, secondary inflammation of adjacent loops of bowel, and potentially a small amount of extraluminal gas posterior to the sigmoid colon which appears contained. Vascular/Lymphatic: No mass along the celiac trunk or SMA. No thrombosis of the portal vein or superior mesenteric vein. Conventional hepatic arterial vascular pattern with the right and left hepatic arteries arising from the proper hepatic artery which has a celiac origin. Aortoiliac atherosclerotic vascular disease. Left periaortic lymph node 0.7 cm in short axis on image 36/7, within normal limits. Mild focal ectasia of the distal most abdominal aorta. Reproductive: Potential secondary inflammation of pelvic structures due to the sigmoid diverticulitis. Other: There is some subtle areas of nodularity along the left paracolic gutter which are very faint but merit surveillance. 2.2 by 1.2 cm nodular focus along the anterior peritoneal margins/ linea alba on image 28/13 could be postoperative scarring or tumor. There is also some nodularity along the umbilicus. Musculoskeletal: 6 mm anterolisthesis at L4-5 due to degenerative facet arthropathy. IMPRESSION: 1. 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 Invasion of the stomach fundus not excluded, renal invasion doubtful. Pancreatic adenocarcinoma is the most likely possibility, lymphoma possible but less likely, GI stromal tumor less likely. 2. There are several nodular enhancing lesions along the linea alba/umbilical region along with faint nodularity along the left paracolic gutter, cannot exclude metastatic lesions. I do not see any metastatic lesions to the liver. 3. 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 There is also fairly prominent sigmoid colon diverticulitis with a small amount of posterior extraluminal contained gas, but no free-flowing intraperitoneal gas. Extensive inflammatory findings surround the involved portion of the sigmoid colon with secondary inflammation of surrounding structures. 4. Trace bilateral pleural effusions.  Moderate cardiomegaly. 5. Low-density blood pool suggests anemia. 6. Mildly distended gallbladder. Small gallstones, as shown on recent ultrasound. Electronically Signed   By: Van Clines M.D.   On: 06/11/2016 11:20   US Abdomen Limited  Result Date: 06/10/2016 CLINICAL DATA:  Right-sided abdominal pain. EXAM: US ABDOMEN LIMITED - RIGHT UPPER QUADRANT COMPARISON:  CT scan from earlier today FINDINGS: Gallbladder: Shadowing stones are seen in the gallbladder with no Murphy's sign, wall thickening, or pericholecystic fluid. Common bile duct: Diameter: 6.8 mm Liver: No focal lesion identified. Within normal limits in parenchymal echogenicity. IMPRESSION: 1. Cholelithiasis with no wall thickening, pericholecystic fluid, or Murphy's sign. 2. The common bile duct measures 6.8 mm which is prominent but within normal limits for a patient of this age. Recommend correlation with labs. Electronically Signed   By: Dorise Bullion III M.D   On: 06/10/2016 00:22   Dg Chest Port 1 View  Result Date: 06/09/2016 CLINICAL DATA:  Acute onset of generalized chest pain and right lower  quadrant abdominal pain. Initial encounter. EXAM: PORTABLE CHEST 1 VIEW COMPARISON:  Chest radiograph performed 05/07/2016, and CTA of the chest performed 03/19/2016 FINDINGS: The lungs are well-aerated. Minimal bibasilar atelectasis is noted. There is no evidence of pleural effusion or pneumothorax. The cardiomediastinal silhouette is mildly enlarged. No acute osseous abnormalities are seen. IMPRESSION: Minimal bibasilar atelectasis noted.  Lungs otherwise clear. Mild cardiomegaly. Electronically Signed   By: Garald Balding M.D.   On: 06/09/2016 21:18    ASSESSMENT & PLAN:  75 year old Caucasian female, with past medical history of hypertension, diabetes, atrial fibrillation, DVT, on Coumadin, presented with abdominal pain and fever.  1. Pancreatic tail mass -I reviewed her CT scan findings with patient, and her friend at bedside today. The skin finding is highly concerning for pancreatic cancer, especially adenocarcinoma.  -Please check CA 19.9 -I recommend tissue biopsy for diagnosis. I told her to interventional radiologist Dr. Vernard Gambles today, who kindly reviewed the image, and feels the pancreatic tail mass is feasible for percutaneous needle biopsy. EGD and EUS is certainly an option to get tissue biopsy, but may require general anesthesia, will discuss with GI also  -she is on coumadin, which needs to be stopped and reversed before biopsy  -She would also need a CT chest without contrast to ruled out pulmonary metastasis -Due to the significant local invasion of the pancreatic tail mass, patient's age, and comorbidities, I doubt surgery is an option  -I plan to meet pt and her husband again after we have the tissue diagnosis. I did call her husband after my visit with pt today  2. Diverticulitis -Managed by hospitalist team  3. Type 2 DM 4. HTN 5. AF 6. History of DVT, on coumadin    Recommendations: -IR percutaneous biopsy or GI EGD/EUS biopsy of the pancreatic tail mass -CT chest without contrast for staging -CA 19.9 -I'll follow up after biopsy   I spoke with Dr. Wynelle Cleveland about the above today, will wait IR and GI to finalize the biopsy plan.    All questions were answered. The patient knows to call the clinic with any problems, questions or concerns.      Truitt Merle, MD 06/11/2016 2:18 PM

## 2016-06-11 NOTE — Progress Notes (Signed)
ANTICOAGULATION CONSULT NOTE - Follow Up Consult  Pharmacy Consult:  Coumadin Indication:  History of Afib and VTE  Allergies  Allergen Reactions  . Morphine And Related Other (See Comments)    Usually drops her blood pressure  . Cymbalta [Duloxetine Hcl] Other (See Comments)    Nausea, headache and diarrhea   . Amoxicillin Other (See Comments)    Unknown Has patient had a PCN reaction causing immediate rash, facial/tongue/throat swelling, SOB or lightheadedness with hypotension:unsure Has patient had a PCN reaction causing severe rash involving mucus membranes or skin necrosis:unsure Has patient had a PCN reaction that required hospitalization:unsure Has patient had a PCN reaction occurring within the last 10 years:unsure If all of the above answers are "NO", then may proceed with Cephalosporin use.     Marland Kitchen Amoxicillin-Pot Clavulanate Other (See Comments)    Has patient had a PCN reaction causing immediate rash, facial/tongue/throat swelling, SOB or lightheadedness with hypotension:unsure Has patient had a PCN reaction causing severe rash involving mucus membranes or skin necrosis:unsure Has patient had a PCN reaction that required hospitalization:unsure Has patient had a PCN reaction occurring within the last 10 years:unsure If all of the above answers are "NO", then may proceed with Cephalosporin use.    . Citalopram Hydrobromide Itching       . Clonazepam Itching  . Macrobid [Nitrofurantoin Macrocrystal] Nausea And Vomiting  . Penicillins Other (See Comments)    Unknown Has patient had a PCN reaction causing immediate rash, facial/tongue/throat swelling, SOB or lightheadedness with hypotension:unsure Has patient had a PCN reaction causing severe rash involving mucus membranes or skin necrosis:unsure Has patient had a PCN reaction that required hospitalization:unsure Has patient had a PCN reaction occurring within the last 10 years:unsure If all of the above answers are  "NO", then may proceed with Cephalosporin use.      . Pyridium [Phenazopyridine Hcl] Other (See Comments)    headache  . Sertraline Hcl Anxiety  . Wellbutrin [Bupropion] Itching, Rash and Other (See Comments)    Itching, rash, hyper   . Zonegran Other (See Comments)    Unknown   . Zonisamide Other (See Comments)    Unknown     Patient Measurements: Height: 5\' 3"  (160 cm) Weight: 162 lb 8 oz (73.7 kg) IBW/kg (Calculated) : 52.4  Vital Signs: Temp: 97.7 F (36.5 C) (10/09 0513) Temp Source: Oral (10/09 0513) BP: 114/85 (10/09 0513) Pulse Rate: 104 (10/09 0513)  Labs:  Recent Labs  06/09/16 2026 06/10/16 0308 06/11/16 0446  HGB 10.3*  --  8.4*  HCT 33.2*  --  27.9*  PLT 287  --  229  LABPROT 23.2* 25.5* 31.3*  INR 2.03 2.28 2.95  CREATININE 0.99 0.70  --     Estimated Creatinine Clearance: 57.5 mL/min (by C-G formula based on SCr of 0.7 mg/dL).    Assessment: 4 YOF to continue on Coumadin from PTA for history of Afib, DVT and PE.  INR therapeutic at 2.95 and trending upwards; no bleeding reported.  Hg 10.3>>8.4, PLTC WNL.   Patient is on Cipro and Flagyl for intra-abdominal infection (?PCN allergy).  These antibiotics could significantly increase the effect of Coumadin.  Coumadin home dose:  2.5mg  Mon/Fri/Sat and 5mg  Tues/Thur/Sun and none on Wed  Goal of Thereapy:  INR 2-3   Plan:  - coumadin 0.5 mg po x 1 dose today - Daily PT / INR Eudelia Bunch, Pharm.D. QP:[id number] 06/11/2016 1:51 PM

## 2016-06-11 NOTE — Progress Notes (Signed)
PROGRESS NOTE    Sarah Mcguire  [id number] DOB: [birth year] DOA: 06/09/2016  PCP: Annye Asa, MD   Brief Narrative:  75 y/o female admitted by my colleague today for abdominal pain sigmoid diverticulitis. I have reviewed the chart, examined her and discussed the plan with the patient and her husband.   Subjective: No complaints of abdominal pain, nausea or vomiting. Very anxious about pancreas mass. No other complaints.  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  she initially declined CT with contrast and then agreed to it - CT showing mass in pancreatic tail invading spleen, abutting stomach- I spoke with IR who recommended an EUS - I spoke with GI, Dr Fuller Plan who will further speak with Dr Ardis Hughs for EUS biopsy - have consulted Oncology, Dr Burr Medico - obtain CA 19-9 - hold warfarin for biopsy  Anemia - Hb 11.2 >> 8.4- no obvious bleeding - on folic acid at home - check anemia panel in AM along with stool occult  U retention with Overflow incontinence - I and O cath revealed 1200 in bladder - Urology recommend keeping a foley in for 2 wks and then doing a voiding trial in their office- patient and husband have declined foley\ - repeat bladder scan for PVR tomorrow    Diabetes mellitus with neuropathy - ISS    Atrial flutter  - Warfarin- therapeutic INR- will need to hold coumadin for procedure - resumed Toprol to prevent rapid A-fib  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  Hypothyroidism - synthroid  Chronic hypoxemic respiratory failure (HCC) COPD (chronic obstructive pulmonary disease) (HCC) - stable, cont O2, Nebs  Hypotension - SBP 80s- steadily improved to SBP in low 100s - Hold Lasix, Lisinopril- cont Toprol    Chronic systolic CHF (congestive heart failure) (HCC) - EF [id number], Gr 1 diastolic dysfunction, RV  EF moderately decreased - ECHO 9/17 - Lasix on hold- slow IVF at 50 cc /hr- stop today  DM 2 - Metformin on hold- SSI  Cholelithiases - incidental finding- d/w patient  Severe anxiety - Xanax   DVT prophylaxis: Coumadin Code Status: Full Code Family Communication: husband Disposition Plan: home when stable Consultants:   Oncology  GI Procedures:    Antimicrobials:  Anti-infectives    Start     Dose/Rate Route Frequency Ordered Stop   06/10/16 0600  ciprofloxacin (CIPRO) IVPB 400 mg     400 mg 200 mL/hr over 60 Minutes Intravenous Every 12 hours 06/10/16 0039     06/09/16 2200  metroNIDAZOLE (FLAGYL) IVPB 500 mg     500 mg 100 mL/hr over 60 Minutes Intravenous Every 8 hours 06/09/16 2038     06/09/16 2200  vancomycin (VANCOCIN) IVPB 750 mg/150 ml premix  Status:  Discontinued     750 mg 150 mL/hr over 60 Minutes Intravenous Every 12 hours 06/09/16 2038 06/10/16 0035   06/09/16 2045  metroNIDAZOLE (FLAGYL) IVPB 500 mg  Status:  Discontinued     500 mg 100 mL/hr over 60 Minutes Intravenous  Once 06/09/16 2033 06/09/16 2038   06/09/16 2045  vancomycin (VANCOCIN) IVPB 1000 mg/200 mL premix  Status:  Discontinued     1,000 mg 200 mL/hr over 60 Minutes Intravenous  Once 06/09/16 2033 06/09/16 2038   06/09/16 2045  cefTRIAXone (ROCEPHIN) 1 g in dextrose 5 % 50 mL IVPB  1 g 100 mL/hr over 30 Minutes Intravenous  Once 06/09/16 2033 06/09/16 2142       Objective: Vitals:   06/10/16 1436 06/10/16 1531 06/10/16 2044 06/11/16 0513  BP: 93/61 93/61 111/80 114/85  Pulse: 93 93 (!) 101 (!) 104  Resp: 16  16 18   Temp:   98.6 F (37 C) 97.7 F (36.5 C)  TempSrc:   Oral Oral  SpO2: 98%  97% 97%  Weight:      Height:        Intake/Output Summary (Last 24 hours) at 06/11/16 1735 Last data filed at 06/11/16 1543  Gross per 24 hour  Intake              895 ml  Output             2975 ml  Net            -2080 ml   Filed Weights   06/10/16 0233  Weight: 73.7 kg  (162 lb 8 oz)    Examination: General exam: Appears comfortable  HEENT: PERRLA, oral mucosa moist, no sclera icterus or thrush Respiratory system: Clear to auscultation. Respiratory effort normal. Cardiovascular system: S1 & S2 heard, RRR.  No murmurs  Gastrointestinal system: Abdomen soft, non-tender, nondistended. Normal bowel sound. No organomegaly Central nervous system: Alert and oriented. No focal neurological deficits. Extremities: No cyanosis, clubbing or edema Skin: No rashes or ulcers Psychiatry:  Mood & affect appropriate.     Data Reviewed: I have personally reviewed following labs and imaging studies  CBC:  Recent Labs Lab 06/08/16 0753 06/09/16 2026 06/11/16 0446  WBC 9.6 15.1* 6.9  NEUTROABS 6.6  --   --   HGB 11.2* 10.3* 8.4*  HCT 35.6* 33.2* 27.9*  MCV 85.6 86.5 87.5  PLT 285 287 [id number]   Basic Metabolic Panel:  Recent Labs Lab 06/08/16 0753 06/09/16 2026 06/10/16 0308  NA 141 134* 141  K 3.7 3.9 3.6  CL 103 98* 106  CO2 27 24 25   GLUCOSE 146* 161* 136*  BUN 11 12 9   CREATININE 0.71 0.99 0.70  CALCIUM 10.6* 10.6* 8.9   GFR: Estimated Creatinine Clearance: 57.5 mL/min (by C-G formula based on SCr of 0.7 mg/dL). Liver Function Tests:  Recent Labs Lab 06/08/16 0753 06/09/16 2026 06/10/16 0308  AST 20 19 16   ALT 13* 12* 12*  ALKPHOS 48 43 38  BILITOT 0.5 0.8 0.7  PROT 6.7 6.4* 5.4*  ALBUMIN 4.2 3.7 3.0*    Recent Labs Lab 06/08/16 0753 06/09/16 2026  LIPASE 21 18   No results for input(s): AMMONIA in the last 168 hours. Coagulation Profile:  Recent Labs Lab 06/06/16 06/09/16 2026 06/10/16 0308 06/11/16 0446  INR 3.6 2.03 2.28 2.95   Cardiac Enzymes: No results for input(s): CKTOTAL, CKMB, CKMBINDEX, TROPONINI in the last 168 hours. BNP (last 3 results) No results for input(s): PROBNP in the last 8760 hours. HbA1C: No results for input(s): HGBA1C in the last 72 hours. CBG:  Recent Labs Lab 06/10/16 1619 06/10/16 2123  06/11/16 0751 06/11/16 1247 06/11/16 1702  GLUCAP 147* 161* 158* 141* 135*   Lipid Profile: No results for input(s): CHOL, HDL, LDLCALC, TRIG, CHOLHDL, LDLDIRECT in the last 72 hours. Thyroid Function Tests: No results for input(s): TSH, T4TOTAL, FREET4, T3FREE, THYROIDAB in the last 72 hours. Anemia Panel: No results for input(s): VITAMINB12, FOLATE, FERRITIN, TIBC, IRON, RETICCTPCT in the last 72 hours. Urine analysis:    Component Value Date/Time  COLORURINE YELLOW 06/09/2016 2228   APPEARANCEUR CLEAR 06/09/2016 2228   LABSPEC <1.005 (L) 06/09/2016 2228   PHURINE 7.5 06/09/2016 2228   GLUCOSEU NEGATIVE 06/09/2016 2228   HGBUR NEGATIVE 06/09/2016 2228   HGBUR negative 10/24/2010 1140   BILIRUBINUR NEGATIVE 06/09/2016 2228   BILIRUBINUR negative 06/04/2016 1108   [place] 06/09/2016 2228   PROTEINUR NEGATIVE 06/09/2016 2228   UROBILINOGEN 0.2 06/04/2016 1108   UROBILINOGEN 0.2 04/04/2015 1328   NITRITE NEGATIVE 06/09/2016 2228   LEUKOCYTESUR SMALL (A) 06/09/2016 2228   Sepsis Labs: @LABRCNTIP (procalcitonin:4,lacticidven:4) ) Recent Results (from the past 240 hour(s))  Urine Culture     Status: None   Collection Time: 06/04/16 12:19 PM  Result Value Ref Range Status   Organism ID, Bacteria NO GROWTH  Final  Blood Culture (routine x 2)     Status: None (Preliminary result)   Collection Time: 06/09/16  9:20 PM  Result Value Ref Range Status   Specimen Description BLOOD RIGHT HAND  Final   Special Requests BOTTLES DRAWN AEROBIC ONLY 5ML  Final   Culture NO GROWTH 2 DAYS  Final   Report Status PENDING  Incomplete  Blood Culture (routine x 2)     Status: None (Preliminary result)   Collection Time: 06/09/16  9:35 PM  Result Value Ref Range Status   Specimen Description BLOOD LEFT HAND  Final   Special Requests BOTTLES DRAWN AEROBIC ONLY 5ML  Final   Culture NO GROWTH 2 DAYS  Final   Report Status PENDING  Incomplete  Urine culture     Status: Abnormal    Collection Time: 06/09/16 10:23 PM  Result Value Ref Range Status   Specimen Description URINE, CLEAN CATCH  Final   Special Requests NONE  Final   Culture MULTIPLE SPECIES PRESENT, SUGGEST RECOLLECTION (A)  Final   Report Status 06/11/2016 FINAL  Final         Radiology Studies: Ct Abdomen Pelvis Wo Contrast  Result Date: 06/09/2016 CLINICAL DATA:  Severe right-sided pain. EXAM: CT ABDOMEN AND PELVIS WITHOUT CONTRAST TECHNIQUE: Multidetector CT imaging of the abdomen and pelvis was performed following the standard protocol without IV contrast. COMPARISON:  None. 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 No other significant abnormalities in the lower chest. Hepatobiliary: Cholelithiasis is identified with mild distention of the gallbladder but no wall thickening or pericholecystic fluid. The liver is unremarkable. Pancreas: The pancreatic head, neck, and body are normal. There is abnormal soft tissue attenuation in the region of the pancreatic tail which is new since July of 2012. This masslike finding is between the stomach and spleen measuring 5.2 x 3.6 cm on image 25 with mild adjacent fat stranding and an attenuation of 28 Hounsfield units. This finding is new since 2012. Spleen: Normal in size without focal abnormality. Adrenals/Urinary Tract: Nodularity in the left adrenal gland is stable. The right adrenal gland is normal. There is a stone in the lower left kidney which is stable. No hydronephrosis. No ureterectasis or ureteral stones. Stomach/Bowel: The bladder is normal in appearance. The small bowel is unremarkable. Colonic diverticuli are identified. There is significant stranding around the sigmoid colon with associated colonic wall thickening. These findings are consistent with diverticulitis. Evaluation for abscess is limited without contrast but none are seen. No extraluminal gas identified. The remainder of the colon is unremarkable. The  visualized appendix is normal as well. Vascular/Lymphatic: Atherosclerosis is seen in the aorta as well as the iliac and femoral  vessels. No aneurysm. No adenopathy. Reproductive: Uterus and bilateral adnexa are unremarkable. Other: No other abnormalities. Musculoskeletal: No acute bony abnormalities. Grade 1 anterolisthesis of L4 versus L5 is stable. IMPRESSION: 1. Sigmoid diverticulitis. 2. 5.2 x 3.6 cm masslike abnormality in the region of the pancreatic tail, between the spleen and the stomach. This is not well assessed without contrast. It is possible this could represent a complex fluid collection including a complex pseudocyst. However, a neoplasm is not excluded. Recommend clinical correlation. Recommend a CT scan with intravenous contrast for better assessment. 3. Cholelithiasis. 4. Atherosclerosis in the abdominal aorta. Electronically Signed   By: Dorise Bullion III M.D   On: 06/09/2016 22:26   Ct Abdomen Pelvis W Contrast  Result Date: 06/11/2016 CLINICAL DATA:  Right lower quadrant abdominal pain. Pancreatic mass. EXAM: CT ABDOMEN AND PELVIS WITH CONTRAST TECHNIQUE: Multidetector CT imaging of the abdomen and pelvis was performed using the standard protocol following bolus administration of intravenous contrast. CONTRAST:  100 cc Isovue 370 COMPARISON:  06/09/2016 CT scan FINDINGS: Lower chest: Trace bilateral pleural effusions with mild atelectasis in both lower lobes. Moderate cardiomegaly. Low-density blood pool. Hepatobiliary: Mildly distended gallbladder, 4.5 cm transverse diameter. Suspected small gallstones dependently in the gallbladder, images 29-32 series 7. No CBD dilatation. I do not see any metastatic lesions in the liver. Pancreas: There is an ill-defined 6.2 by 7.0 by 6.0 cm irregularly marginated hypoenhancing mass most closely associated with the tail the pancreas, invading the splenic hilum, surrounding the splenic artery, and abutting and conceivably invading the elder layers of  the gastric wall, images 20 through 41 series 3. 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 The mass extends to the margin of the left kidney and not image 78/12 but does not appear to invade into the renal parenchyma as best I can tell. Irregular extension of tumor along the margins of the mass. The rest of the pancreas is atrophic. Spleen: The lower spleen is invaded by the pancreatic mass. Adrenals/Urinary Tract: The left adrenal gland appears involved with the pancreatic mass, thickened and directly tangential to the mass. Stomach/Bowel: The mass abuts and may be invading the gastric fundus. There is considerable active sigmoid diverticulitis with extensive local inflammation, stranding in adipose tissues, secondary inflammation of adjacent loops of bowel, and potentially a small amount of extraluminal gas posterior to the sigmoid colon which appears contained. Vascular/Lymphatic: No mass along the celiac trunk or SMA. No thrombosis of the portal vein or superior mesenteric vein. Conventional hepatic arterial vascular pattern with the right and left hepatic arteries arising from the proper hepatic artery which has a celiac origin. Aortoiliac atherosclerotic vascular disease. Left periaortic lymph node 0.7 cm in short axis on image 36/7, within normal limits. Mild focal ectasia of the distal most abdominal aorta. Reproductive: Potential secondary inflammation of pelvic structures due to the sigmoid diverticulitis. Other: There is some subtle areas of nodularity along the left paracolic gutter which are very faint but merit surveillance. 2.2 by 1.2 cm nodular focus along the anterior peritoneal margins/ linea alba on image 28/13 could be postoperative scarring or tumor. There is also some nodularity along the umbilicus. Musculoskeletal: 6 mm  anterolisthesis at L4-5 due to degenerative facet arthropathy. IMPRESSION: 1. Irregular 6.2 by 7.0 by 6.0 cm hypoenhancing pancreatic tail mass surrounding the splenic artery, invading the spleen, invading  the left adrenal gland, abutting the stomach fundus, but with a small portion abutting the left kidney upper pole. Invasion of the stomach fundus not excluded, renal invasion doubtful. Pancreatic adenocarcinoma is the most likely possibility, lymphoma possible but less likely, GI stromal tumor less likely. 2. There are several nodular enhancing lesions along the linea alba/umbilical region along with faint nodularity along the left paracolic gutter, cannot exclude metastatic lesions. I do not see any metastatic lesions to the liver. 3. 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 There is also fairly prominent sigmoid colon diverticulitis with a small amount of posterior extraluminal contained gas, but no free-flowing intraperitoneal gas. Extensive inflammatory findings surround the involved portion of the sigmoid colon with secondary inflammation of surrounding structures. 4. Trace bilateral pleural effusions.  Moderate cardiomegaly. 5. Low-density blood pool suggests anemia. 6. Mildly distended gallbladder. Small gallstones, as shown on recent ultrasound. Electronically Signed   By: Van Clines M.D.   On: 06/11/2016 11:20   US Abdomen Limited  Result Date: 06/10/2016 CLINICAL DATA:  Right-sided abdominal pain. EXAM: US ABDOMEN LIMITED - RIGHT UPPER QUADRANT COMPARISON:  CT scan from earlier today FINDINGS: Gallbladder: Shadowing stones are seen in the gallbladder with no Murphy's sign, wall thickening, or pericholecystic fluid. Common bile duct: Diameter: 6.8 mm Liver: No focal lesion identified. Within normal limits in parenchymal echogenicity. IMPRESSION: 1. Cholelithiasis with no wall thickening, pericholecystic fluid, or Murphy's sign. 2. The common bile duct  measures 6.8 mm which is prominent but within normal limits for a patient of this age. Recommend correlation with labs. Electronically Signed   By: Dorise Bullion III M.D   On: 06/10/2016 00:22   Dg Chest Port 1 View  Result Date: 06/09/2016 CLINICAL DATA:  Acute onset of generalized chest pain and right lower quadrant abdominal pain. Initial encounter. EXAM: PORTABLE CHEST 1 VIEW COMPARISON:  Chest radiograph performed 05/07/2016, and CTA of the chest performed 03/19/2016 FINDINGS: The lungs are well-aerated. Minimal bibasilar atelectasis is noted. There is no evidence of pleural effusion or pneumothorax. The cardiomediastinal silhouette is mildly enlarged. No acute osseous abnormalities are seen. IMPRESSION: Minimal bibasilar atelectasis noted. Lungs otherwise clear. Mild cardiomegaly. Electronically Signed   By: Garald Balding M.D.   On: 06/09/2016 21:18      Scheduled Meds: . ciprofloxacin  400 mg Intravenous Q12H  . clotrimazole  1 Applicatorful Vaginal QHS  . fenofibrate  160 mg Oral Daily  . fluticasone furoate-vilanterol  1 puff Inhalation Daily  . folic acid  1 mg Oral Daily  .  HYDROmorphone (DILAUDID) injection  0.5 mg Intravenous Once  . Influenza vac split quadrivalent PF  0.5 mL Intramuscular Once  . insulin aspart  0-5 Units Subcutaneous QHS  . insulin aspart  0-9 Units Subcutaneous TID WC  . levothyroxine  50 mcg Oral QAC breakfast  . methocarbamol  500 mg Oral TID  . metoprolol succinate  25 mg Oral Daily  . metronidazole  500 mg Intravenous Q8H  . pantoprazole  40 mg Oral Daily  . sertraline  25 mg Oral QHS  . simvastatin  20 mg Oral QPM  . warfarin  0.5 mg Oral ONCE-1800  . Warfarin - Pharmacist Dosing Inpatient   Does not apply q1800   Continuous Infusions:    LOS: 1 day    Time spent in minutes: 29    Harold, MD [facility] Pager: [email or web address] Password [email or web address] 06/11/2016, 5:35 PM

## 2016-06-12 DIAGNOSIS — I952 Hypotension due to drugs: Secondary | ICD-10-CM

## 2016-06-12 DIAGNOSIS — J438 Other emphysema: Secondary | ICD-10-CM

## 2016-06-12 LAB — BASIC METABOLIC PANEL
ANION GAP: 7 (ref 5–15)
BUN: <5 mg/dL — ABNORMAL LOW (ref 6–20)
CALCIUM: 8.8 mg/dL — AB (ref 8.9–10.3)
CO2: 27 mmol/L (ref 22–32)
CREATININE: 0.61 mg/dL (ref 0.44–1.00)
Chloride: 102 mmol/L (ref 101–111)
Glucose, Bld: 141 mg/dL — ABNORMAL HIGH (ref 65–99)
Potassium: 3.7 mmol/L (ref 3.5–5.1)
SODIUM: 136 mmol/L (ref 135–145)

## 2016-06-12 LAB — CBC
HEMATOCRIT: 28.2 % — AB (ref 36.0–46.0)
Hemoglobin: 8.7 g/dL — ABNORMAL LOW (ref 12.0–15.0)
MCH: 26.8 pg (ref 26.0–34.0)
MCHC: 30.9 g/dL (ref 30.0–36.0)
MCV: 86.8 fL (ref 78.0–100.0)
PLATELETS: 267 10*3/uL (ref 150–400)
RBC: 3.25 MIL/uL — ABNORMAL LOW (ref 3.87–5.11)
RDW: 17 % — AB (ref 11.5–15.5)
WBC: 6.5 10*3/uL (ref 4.0–10.5)

## 2016-06-12 LAB — GLUCOSE, CAPILLARY
GLUCOSE-CAPILLARY: 123 mg/dL — AB (ref 65–99)
GLUCOSE-CAPILLARY: 144 mg/dL — AB (ref 65–99)
Glucose-Capillary: 151 mg/dL — ABNORMAL HIGH (ref 65–99)
Glucose-Capillary: 126 mg/dL — ABNORMAL HIGH (ref 65–99)

## 2016-06-12 LAB — PROTIME-INR
INR: 1.74
PROTHROMBIN TIME: 20.6 s — AB (ref 11.4–15.2)

## 2016-06-12 MED ORDER — ALPRAZOLAM 0.5 MG PO TABS
1.5000 mg | ORAL_TABLET | ORAL | Status: AC | PRN
  Administered 2016-06-12: 1.5 mg via ORAL
  Filled 2016-06-12: qty 3

## 2016-06-12 MED ORDER — METRONIDAZOLE 500 MG PO TABS
500.0000 mg | ORAL_TABLET | Freq: Three times a day (TID) | ORAL | Status: DC
  Administered 2016-06-12 – 2016-06-13 (×4): 500 mg via ORAL
  Filled 2016-06-12 (×4): qty 1

## 2016-06-12 MED ORDER — ALPRAZOLAM 0.5 MG PO TABS
1.5000 mg | ORAL_TABLET | ORAL | Status: DC | PRN
  Administered 2016-06-12 – 2016-06-13 (×5): 1.5 mg via ORAL
  Filled 2016-06-12 (×6): qty 3

## 2016-06-12 MED ORDER — ONDANSETRON HCL 4 MG/2ML IJ SOLN: 4.0000 mg | Freq: Four times a day (QID) | INTRAMUSCULAR | Status: DC | PRN

## 2016-06-12 MED ORDER — ALPRAZOLAM 0.5 MG PO TABS: 1.5000 mg | ORAL_TABLET | ORAL | Status: DC | PRN

## 2016-06-12 MED ORDER — LISINOPRIL 10 MG PO TABS
10.0000 mg | ORAL_TABLET | Freq: Every day | ORAL | Status: DC
  Administered 2016-06-13: 10 mg via ORAL
  Filled 2016-06-12: qty 1

## 2016-06-12 MED ORDER — CIPROFLOXACIN HCL 500 MG PO TABS
500.0000 mg | ORAL_TABLET | Freq: Two times a day (BID) | ORAL | Status: DC
  Administered 2016-06-12 – 2016-06-13 (×3): 500 mg via ORAL
  Filled 2016-06-12 (×3): qty 1

## 2016-06-12 NOTE — Progress Notes (Signed)
PROGRESS NOTE    Sarah Mcguire  [id number] DOB: 03-14-1941 DOA: 06/09/2016  PCP: Annye Asa, MD   Brief Narrative:  75 y/o female admitted by my colleague today for abdominal pain sigmoid diverticulitis. I have reviewed the chart, examined her and discussed the plan with the patient and her husband.   Subjective:   Very anxious still today about pancreas mass. Wanting to increase Xanax to Q 2 hours. No other complaints.  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  she initially declined CT with contrast and then agreed to it - CT showing mass in pancreatic tail invading spleen, abutting stomach- I spoke with IR who recommended an EUS - I spoke with GI, Dr Fuller Plan who will further speak with Dr Ardis Hughs for EUS biopsy - have consulted Oncology, Dr Burr Medico - obtain CA 19-9 - holding warfarin for biopsy- 1 mg Vit K given on 10/9- INR 1.7 today  Anemia - Hb 11.2 >> 8.4- no obvious bleeding - on folic acid at home -  anemia panel shows low Iron level sat, normal iron binding and normal Ferretin and AOCD  - f/u stool occult  U retention with Overflow incontinence - I and O cath revealed 1200 in bladder - Urology recommend keeping a foley in for 2 wks and then doing a voiding trial in their office- patient and husband have declined foley - repeat bladder scan today for PVR  which was ~ 300-  no need for foley    Diabetes mellitus with neuropathy - ISS    Atrial flutter  - Warfarin-  will need to hold for procedure - resumed Toprol to prevent rapid A-fib  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  Hypothyroidism - synthroid  Chronic hypoxemic respiratory failure (HCC) COPD (chronic obstructive pulmonary disease) (HCC) - stable, cont O2, Nebs  Hypotension - SBP 80s- steadily improved  - Held Lasix, Lisinopril- cont  Toprol- can resume Lisinopril today    Chronic systolic CHF (congestive heart failure) (HCC) - EF [id number], Gr 1 diastolic dysfunction, RV EF moderately decreased - ECHO 9/17 - Lasix on hold- slow IVF at 50 cc /hr- stopped on 10/9  - weight stable-  DM 2 - Metformin on hold- SSI  Cholelithiases - incidental finding- d/w patient  Severe anxiety - Xanax- having to increase dose to 1.5 mg tab- per RN, she is not over sedated on this dose   DVT prophylaxis: Coumadin Code Status: Full Code Family Communication: husband Disposition Plan: home when stable Consultants:   Oncology  GI Procedures:    Antimicrobials:  Anti-infectives    Start     Dose/Rate Route Frequency Ordered Stop   06/12/16 1400  metroNIDAZOLE (FLAGYL) tablet 500 mg     500 mg Oral Every 8 hours 06/12/16 0959     06/12/16 1030  ciprofloxacin (CIPRO) tablet 500 mg     500 mg Oral 2 times daily 06/12/16 0959     06/10/16 0600  ciprofloxacin (CIPRO) IVPB 400 mg  Status:  Discontinued     400 mg 200 mL/hr over 60 Minutes Intravenous Every 12 hours 06/10/16 0039 06/12/16 0959   06/09/16 2200  metroNIDAZOLE (FLAGYL) IVPB 500 mg  Status:  Discontinued     500 mg 100 mL/hr over 60 Minutes Intravenous Every 8 hours 06/09/16 2038 06/12/16 0959   06/09/16 2200  vancomycin (  VANCOCIN) IVPB 750 mg/150 ml premix  Status:  Discontinued     750 mg 150 mL/hr over 60 Minutes Intravenous Every 12 hours 06/09/16 2038 06/10/16 0035   06/09/16 2045  metroNIDAZOLE (FLAGYL) IVPB 500 mg  Status:  Discontinued     500 mg 100 mL/hr over 60 Minutes Intravenous  Once 06/09/16 2033 06/09/16 2038   06/09/16 2045  vancomycin (VANCOCIN) IVPB 1000 mg/200 mL premix  Status:  Discontinued     1,000 mg 200 mL/hr over 60 Minutes Intravenous  Once 06/09/16 2033 06/09/16 2038   06/09/16 2045  cefTRIAXone (ROCEPHIN) 1 g in dextrose 5 % 50 mL IVPB     1 g 100 mL/hr over 30 Minutes Intravenous  Once 06/09/16 2033 06/09/16 2142        Objective: Vitals:   06/11/16 1700 06/11/16 2056 06/12/16 0447 06/12/16 1433  BP: (!) 145/73 (!) 145/81 131/68 124/60  Pulse: 77 77 69 84  Resp:  18 18 18   Temp: 98 F (36.7 C) 97.6 F (36.4 C) 98.7 F (37.1 C) 98 F (36.7 C)  TempSrc: Oral Oral Oral Oral  SpO2: 98% 99% 98% 98%  Weight:   74.8 kg (164 lb 12.8 oz)   Height:        Intake/Output Summary (Last 24 hours) at 06/12/16 1642 Last data filed at 06/12/16 1240  Gross per 24 hour  Intake                0 ml  Output             3461 ml  Net            -3461 ml   Filed Weights   06/10/16 0233 06/12/16 0447  Weight: 73.7 kg (162 lb 8 oz) 74.8 kg (164 lb 12.8 oz)    Examination: General exam: Appears comfortable  HEENT: PERRLA, oral mucosa moist, no sclera icterus or thrush Respiratory system: Clear to auscultation. Respiratory effort normal. Cardiovascular system: S1 & S2 heard, RRR.  No murmurs  Gastrointestinal system: Abdomen soft, non-tender, nondistended. Normal bowel sound. No organomegaly Central nervous system: Alert and oriented. No focal neurological deficits. Extremities: No cyanosis, clubbing or edema Skin: No rashes or ulcers Psychiatry:  Mood & affect appropriate.     Data Reviewed: I have personally reviewed following labs and imaging studies  CBC:  Recent Labs Lab 06/08/16 0753 06/09/16 2026 06/11/16 0446 06/12/16 0547  WBC 9.6 15.1* 6.9 6.5  NEUTROABS 6.6  --   --   --   HGB 11.2* 10.3* 8.4* 8.7*  HCT 35.6* 33.2* 27.9* 28.2*  MCV 85.6 86.5 87.5 86.8  PLT 285 287 229 [id number]   Basic Metabolic Panel:  Recent Labs Lab 06/08/16 0753 06/09/16 2026 06/10/16 0308 06/12/16 0547  NA 141 134* 141 136  K 3.7 3.9 3.6 3.7  CL 103 98* 106 102  CO2 27 24 25 27   GLUCOSE 146* 161* 136* 141*  BUN 11 12 9  <5*  CREATININE 0.71 0.99 0.70 0.61  CALCIUM 10.6* 10.6* 8.9 8.8*   GFR: Estimated Creatinine Clearance: 58 mL/min (by C-G formula based on SCr of 0.61 mg/dL). Liver Function  Tests:  Recent Labs Lab 06/08/16 0753 06/09/16 2026 06/10/16 0308  AST 20 19 16   ALT 13* 12* 12*  ALKPHOS 48 43 38  BILITOT 0.5 0.8 0.7  PROT 6.7 6.4* 5.4*  ALBUMIN 4.2 3.7 3.0*    Recent Labs Lab 06/08/16 0753 06/09/16 2026  LIPASE 21 18  No results for input(s): AMMONIA in the last 168 hours. Coagulation Profile:  Recent Labs Lab 06/06/16 06/09/16 2026 06/10/16 0308 06/11/16 0446 06/12/16 0547  INR 3.6 2.03 2.28 2.95 1.74   Cardiac Enzymes: No results for input(s): CKTOTAL, CKMB, CKMBINDEX, TROPONINI in the last 168 hours. BNP (last 3 results) No results for input(s): PROBNP in the last 8760 hours. HbA1C: No results for input(s): HGBA1C in the last 72 hours. CBG:  Recent Labs Lab 06/11/16 1702 06/11/16 2058 06/12/16 0744 06/12/16 1156 06/12/16 1620  GLUCAP 135* 128* 151* 123* 144*   Lipid Profile: No results for input(s): CHOL, HDL, LDLCALC, TRIG, CHOLHDL, LDLDIRECT in the last 72 hours. Thyroid Function Tests: No results for input(s): TSH, T4TOTAL, FREET4, T3FREE, THYROIDAB in the last 72 hours. Anemia Panel:  Recent Labs  06/11/16 1826  VITAMINB12 881  FOLATE 55.2  FERRITIN 63  TIBC 315  IRON 17*  RETICCTPCT 1.9   Urine analysis:    Component Value Date/Time   COLORURINE YELLOW 06/09/2016 2228   APPEARANCEUR CLEAR 06/09/2016 2228   LABSPEC <1.005 (L) 06/09/2016 2228   PHURINE 7.5 06/09/2016 2228   GLUCOSEU NEGATIVE 06/09/2016 2228   HGBUR NEGATIVE 06/09/2016 2228   HGBUR negative 10/24/2010 1140   BILIRUBINUR NEGATIVE 06/09/2016 2228   BILIRUBINUR negative 06/04/2016 1108   KETONESUR NEGATIVE 06/09/2016 2228   PROTEINUR NEGATIVE 06/09/2016 2228   UROBILINOGEN 0.2 06/04/2016 1108   UROBILINOGEN 0.2 04/04/2015 1328   NITRITE NEGATIVE 06/09/2016 2228   LEUKOCYTESUR SMALL (A) 06/09/2016 2228   Sepsis Labs: @LABRCNTIP (procalcitonin:4,lacticidven:4) ) Recent Results (from the past 240 hour(s))  Urine Culture     Status: None    Collection Time: 06/04/16 12:19 PM  Result Value Ref Range Status   Organism ID, Bacteria NO GROWTH  Final  Blood Culture (routine x 2)     Status: None (Preliminary result)   Collection Time: 06/09/16  9:20 PM  Result Value Ref Range Status   Specimen Description BLOOD RIGHT HAND  Final   Special Requests BOTTLES DRAWN AEROBIC ONLY 5ML  Final   Culture NO GROWTH 3 DAYS  Final   Report Status PENDING  Incomplete  Blood Culture (routine x 2)     Status: None (Preliminary result)   Collection Time: 06/09/16  9:35 PM  Result Value Ref Range Status   Specimen Description BLOOD LEFT HAND  Final   Special Requests BOTTLES DRAWN AEROBIC ONLY 5ML  Final   Culture NO GROWTH 3 DAYS  Final   Report Status PENDING  Incomplete  Urine culture     Status: Abnormal   Collection Time: 06/09/16 10:23 PM  Result Value Ref Range Status   Specimen Description URINE, CLEAN CATCH  Final   Special Requests NONE  Final   Culture MULTIPLE SPECIES PRESENT, SUGGEST RECOLLECTION (A)  Final   Report Status 06/11/2016 FINAL  Final         Radiology Studies: Ct Abdomen Pelvis W Contrast  Result Date: 06/11/2016 CLINICAL DATA:  Right lower quadrant abdominal pain. Pancreatic mass. EXAM: CT ABDOMEN AND PELVIS WITH CONTRAST TECHNIQUE: Multidetector CT imaging of the abdomen and pelvis was performed using the standard protocol following bolus administration of intravenous contrast. CONTRAST:  100 cc Isovue 370 COMPARISON:  06/09/2016 CT scan FINDINGS: Lower chest: Trace bilateral pleural effusions with mild atelectasis in both lower lobes. Moderate cardiomegaly. Low-density blood pool. Hepatobiliary: Mildly distended gallbladder, 4.5 cm transverse diameter. Suspected small gallstones dependently in the gallbladder, images 29-32 series 7. No CBD dilatation. I  do not see any metastatic lesions in the liver. Pancreas: There is an ill-defined 6.2 by 7.0 by 6.0 cm irregularly marginated hypoenhancing mass most closely  associated with the tail the pancreas, invading the splenic hilum, surrounding the splenic artery, and abutting and conceivably invading the elder layers of the gastric wall, images 20 through 41 series 3. 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 The mass extends to the margin of the left kidney and not image 78/12 but does not appear to invade into the renal parenchyma as best I can tell. Irregular extension of tumor along the margins of the mass. The rest of the pancreas is atrophic. Spleen: The lower spleen is invaded by the pancreatic mass. Adrenals/Urinary Tract: The left adrenal gland appears involved with the pancreatic mass, thickened and directly tangential to the mass. Stomach/Bowel: The mass abuts and may be invading the gastric fundus. There is considerable active sigmoid diverticulitis with extensive local inflammation, stranding in adipose tissues, secondary inflammation of adjacent loops of bowel, and potentially a small amount of extraluminal gas posterior to the sigmoid colon which appears contained. Vascular/Lymphatic: No mass along the celiac trunk or SMA. No thrombosis of the portal vein or superior mesenteric vein. Conventional hepatic arterial vascular pattern with the right and left hepatic arteries arising from the proper hepatic artery which has a celiac origin. Aortoiliac atherosclerotic vascular disease. Left periaortic lymph node 0.7 cm in short axis on image 36/7, within normal limits. Mild focal ectasia of the distal most abdominal aorta. Reproductive: Potential secondary inflammation of pelvic structures due to the sigmoid diverticulitis. Other: There is some subtle areas of nodularity along the left paracolic gutter which are very faint but merit surveillance. 2.2 by 1.2 cm nodular focus along the anterior peritoneal  margins/ linea alba on image 28/13 could be postoperative scarring or tumor. There is also some nodularity along the umbilicus. Musculoskeletal: 6 mm anterolisthesis at L4-5 due to degenerative facet arthropathy. IMPRESSION: 1. 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 Invasion of the stomach fundus not excluded, renal invasion doubtful. Pancreatic adenocarcinoma is the most likely possibility, lymphoma possible but less likely, GI stromal tumor less likely. 2. There are several nodular enhancing lesions along the linea alba/umbilical region along with faint nodularity along the left paracolic gutter, cannot exclude metastatic lesions. I do not see any metastatic lesions to the liver. 3. 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 There is also fairly prominent sigmoid colon diverticulitis with a small amount of posterior extraluminal contained gas, but no free-flowing intraperitoneal gas. Extensive inflammatory findings surround the involved portion of the sigmoid colon with secondary inflammation of surrounding structures. 4. Trace bilateral pleural effusions.  Moderate cardiomegaly. 5. Low-density blood pool suggests anemia. 6. Mildly distended gallbladder. Small gallstones, as shown on recent ultrasound. Electronically Signed   By: Van Clines M.D.   On: 06/11/2016 11:20      Scheduled Meds: . ciprofloxacin  500 mg Oral BID  . clotrimazole  1 Applicatorful Vaginal QHS  . fenofibrate  160 mg Oral Daily  . fluticasone furoate-vilanterol  1 puff Inhalation Daily  . folic acid  1 mg Oral  Daily  .  HYDROmorphone (DILAUDID) injection  0.5 mg Intravenous Once  . Influenza vac split quadrivalent PF  0.5 mL Intramuscular Once  . insulin aspart  0-5 Units Subcutaneous  QHS  . insulin aspart  0-9 Units Subcutaneous TID WC  . levothyroxine  50 mcg Oral QAC breakfast  . methocarbamol  500 mg Oral TID  . metoprolol succinate  25 mg Oral Daily  . metroNIDAZOLE  500 mg Oral Q8H  . pantoprazole  40 mg Oral Daily  . sertraline  25 mg Oral QHS  . simvastatin  20 mg Oral QPM  . Warfarin - Pharmacist Dosing Inpatient   Does not apply q1800   Continuous Infusions:    LOS: 2 days    Time spent in minutes: 25    Allgood, MD [facility] Pager: [email or web address] Password [email or web address] 06/12/2016, 4:42 PM

## 2016-06-12 NOTE — Progress Notes (Signed)
ANTICOAGULATION CONSULT NOTE - Follow Up Consult  Pharmacy Consult:  Coumadin Indication:  History of Afib and VTE  Allergies  Allergen Reactions  . Morphine And Related Other (See Comments)    Usually drops her blood pressure  . Cymbalta [Duloxetine Hcl] Other (See Comments)    Nausea, headache and diarrhea   . Amoxicillin Other (See Comments)    Unknown Has patient had a PCN reaction causing immediate rash, facial/tongue/throat swelling, SOB or lightheadedness with hypotension:unsure Has patient had a PCN reaction causing severe rash involving mucus membranes or skin necrosis:unsure Has patient had a PCN reaction that required hospitalization:unsure Has patient had a PCN reaction occurring within the last 10 years:unsure If all of the above answers are "NO", then may proceed with Cephalosporin use.     Marland Kitchen Amoxicillin-Pot Clavulanate Other (See Comments)    Has patient had a PCN reaction causing immediate rash, facial/tongue/throat swelling, SOB or lightheadedness with hypotension:unsure Has patient had a PCN reaction causing severe rash involving mucus membranes or skin necrosis:unsure Has patient had a PCN reaction that required hospitalization:unsure Has patient had a PCN reaction occurring within the last 10 years:unsure If all of the above answers are "NO", then may proceed with Cephalosporin use.    . Citalopram Hydrobromide Itching       . Clonazepam Itching  . Macrobid [Nitrofurantoin Macrocrystal] Nausea And Vomiting  . Penicillins Other (See Comments)    Unknown Has patient had a PCN reaction causing immediate rash, facial/tongue/throat swelling, SOB or lightheadedness with hypotension:unsure Has patient had a PCN reaction causing severe rash involving mucus membranes or skin necrosis:unsure Has patient had a PCN reaction that required hospitalization:unsure Has patient had a PCN reaction occurring within the last 10 years:unsure If all of the above answers are  "NO", then may proceed with Cephalosporin use.      . Pyridium [Phenazopyridine Hcl] Other (See Comments)    headache  . Sertraline Hcl Anxiety  . Wellbutrin [Bupropion] Itching, Rash and Other (See Comments)    Itching, rash, hyper   . Zonegran Other (See Comments)    Unknown   . Zonisamide Other (See Comments)    Unknown     Patient Measurements: Height: 5\' 3"  (160 cm) Weight: 164 lb 12.8 oz (74.8 kg) IBW/kg (Calculated) : 52.4  Vital Signs: Temp: 98.7 F (37.1 C) (10/10 0447) Temp Source: Oral (10/10 0447) BP: 131/68 (10/10 0447) Pulse Rate: 69 (10/10 0447)  Labs:  Recent Labs  06/09/16 2026 06/10/16 0308 06/11/16 0446 06/12/16 0547  HGB 10.3*  --  8.4* 8.7*  HCT 33.2*  --  27.9* 28.2*  PLT 287  --  229 267  LABPROT 23.2* 25.5* 31.3* 20.6*  INR 2.03 2.28 2.95 1.74  CREATININE 0.99 0.70  --  0.61    Estimated Creatinine Clearance: 58 mL/min (by C-G formula based on SCr of 0.61 mg/dL).    Assessment: 77 YOF to continue on Coumadin from PTA for history of Afib, DVT and PE.  Vitamin K 1 mg 10/9 in preparation for biopsy 10/11 for pancreatic mass   Patient is on Cipro and Flagyl for intra-abdominal infection (?PCN allergy).  These antibiotics could significantly increase the effect of Coumadin. Coumadin home dose:  2.5mg  Mon/Fri/Sat and 5mg  Tues/Thur/Sun and none on Wed  Goal of Thereapy:  INR 2-3   Plan:  Hold Coumadin for pending biopsy Follow up AM INR  Thank you Anette Guarneri, PharmD [phone] 06/12/2016 2:19 PM

## 2016-06-12 NOTE — Consult Note (Signed)
Chief Complaint: Patient was seen in consultation today for pancreatic mass biopsy Chief Complaint  Patient presents with  . Abdominal Pain   at the request of Dr Reggy Eye  Referring Physician(s): Dr Truitt Merle  Supervising Physician: Arne Cleveland  Patient Status: Inpatient  History of Present Illness: Sarah Mcguire is a 75 y.o. female   COPD/Emphysema Afib and Hx DVT---chronic coumadin CHF  Presented to Ed with RUQ pain  10/9 CT revealed IMPRESSION: 1. 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 Invasion of the stomach fundus not excluded, renal invasion doubtful. Pancreatic adenocarcinoma is the most likely possibility, lymphoma possible but less likely, GI stromal tumor less likely. 2. There are several nodular enhancing lesions along the linea alba/umbilical region along with faint nodularity along the left paracolic gutter, cannot exclude metastatic lesions. I do not see any metastatic lesions to the liver. 3. 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 There is also fairly prominent sigmoid colon diverticulitis with a small amount of posterior extraluminal contained gas, but no free-flowing intraperitoneal gas. Extensive inflammatory findings surround the involved portion of the sigmoid colon with secondary inflammation of surrounding structures. 4. Trace bilateral pleural effusions.  Moderate cardiomegaly. 5. Low-density blood pool suggests anemia. 6. Mildly distended gallbladder. Small gallstones, as shown on recent ultrasound  GI MD reviewed imaging and felt best for needle biopsy instead of EUS Request for biopsy per Dr Burr Medico Dr Vernard Gambles has reviewed imaging and approves procedure Off coumadin now day 2 INR 1.7 Will  recheck in am    Past Medical History:  Diagnosis Date  . Anemia   . Anxiety    SEVERE  . Arthritis    "probably in my back" (05/08/2016)  . Atrial fibrillation with RVR ([place]) 11/28/2012  . Chronic lower back pain   . Diverticulitis   . DVT (deep venous thrombosis) ([place])   . Emphysema of lung ([place])   . GERD (gastroesophageal reflux disease)   . High cholesterol   . Hyperglycemia   . Hyperlipidemia   . Hypertension   . Leukocytosis   . Neuropathy ([place])   . On home oxygen therapy    "2L; 24/7 prn" (05/08/2016)  . PE (pulmonary embolism)    Bilateral PE, stringy saddle embolus with predominant proximal lower lobe pulmonary artery involvement 06/26/12  . Type II diabetes mellitus ([place])     Past Surgical History:  Procedure Laterality Date  . CARDIAC CATHETERIZATION N/A 05/30/2016   Procedure: Left Heart Cath and Coronary Angiography;  Surgeon: Peter M Martinique, MD;  Location: [facility];  Service: Cardiovascular;  Laterality: N/A;  . CATARACT EXTRACTION W/ INTRAOCULAR LENS  IMPLANT, BILATERAL Bilateral 08/2013  . JOINT REPLACEMENT    . TONSILLECTOMY    . TOTAL KNEE ARTHROPLASTY Left 11/24/2012   Procedure: TOTAL KNEE ARTHROPLASTY;  Surgeon: Kerin Salen, MD;  Location: [facility];  Service: Orthopedics;  Laterality: Left;  . TYMPANOSTOMY TUBE PLACEMENT Right early 2000s    Allergies: Morphine and related; Cymbalta [duloxetine hcl]; Amoxicillin; Amoxicillin-pot clavulanate; Citalopram hydrobromide; Clonazepam; Macrobid [nitrofurantoin macrocrystal]; Penicillins; Pyridium [phenazopyridine hcl]; Sertraline hcl; Wellbutrin [bupropion]; Zonegran; and Zonisamide  Medications: Prior to Admission medications   Medication Sig Start Date End Date Taking? Authorizing Provider  acetaminophen (TYLENOL) 325 MG tablet Take 325-650 mg  by mouth every 6 (six) hours as needed (for headache).    Yes Historical Provider, MD  albuterol (PROVENTIL HFA;VENTOLIN HFA) 108 (90 Base) MCG/ACT inhaler  Inhale 2 puffs into the lungs every 6 (six) hours as needed for wheezing or shortness of breath. 04/17/16  Yes Tammy S Parrett, NP  alprazolam (XANAX) 2 MG tablet TAKE 1/2 TO 1 TABLET BY MOUTH 3 TIMES A DAY AS NEEDED FOR SLEEP OR ANXIETY Patient taking differently: TAKE 1 TABLET BY MOUTH THREE TIMES DAILY 05/11/16  Yes Midge Minium, MD  calcium citrate-vitamin D (CITRACAL+D) 315-200 MG-UNIT tablet Take 2 tablets by mouth daily.   Yes Historical Provider, MD  Cholecalciferol (VITAMIN D) 2000 UNITS tablet Take 2,000 Units by mouth daily.   Yes Historical Provider, MD  esomeprazole (NEXIUM) 40 MG capsule Take 1 capsule (40 mg total) by mouth daily as needed. For acid reflux. 05/24/15  Yes Midge Minium, MD  fenofibrate 160 MG tablet Take 1 tablet (160 mg total) by mouth daily. 03/20/16  Yes Midge Minium, MD  Fluticasone Furoate-Vilanterol (BREO ELLIPTA IN) Inhale 1 puff into the lungs daily.   Yes Historical Provider, MD  folic acid (FOLVITE) 1 MG tablet Take 1 tablet (1 mg total) by mouth daily. 05/31/14  Yes Midge Minium, MD  furosemide (LASIX) 40 MG tablet TAKE 1 TABLET BY MOUTH EVERY DAY Patient taking differently: Take 20 mg by mouth every day 04/23/16  Yes Midge Minium, MD  HYDROcodone-acetaminophen (NORCO/VICODIN) 5-325 MG tablet Take 1-2 tablets by mouth every 4 (four) hours as needed. Patient taking differently: Take 1-2 tablets by mouth every 4 (four) hours as needed for moderate pain.  06/04/16  Yes Midge Minium, MD  levothyroxine (SYNTHROID, LEVOTHROID) 50 MCG tablet Take 1 tablet (50 mcg total) by mouth daily. Patient taking differently: Take 50 mcg by mouth daily before breakfast.  12/19/15  Yes Midge Minium, MD  lisinopril (PRINIVIL,ZESTRIL) 10 MG tablet TAKE 1 TABLET BY MOUTH EVERY DAY Patient taking differently: Take 10 mg by mouth every day 05/28/16  Yes Midge Minium, MD  meloxicam (MOBIC) 7.5 MG tablet Take 7.5 mg by mouth daily as needed for  pain.   Yes Historical Provider, MD  metFORMIN (GLUCOPHAGE-XR) 500 MG 24 hr tablet Take 500-1,000 mg by mouth See admin instructions. Pt takes 1000mg  in morning after breakfast, 500mg  in evening after dinner   Yes Historical Provider, MD  methocarbamol (ROBAXIN) 500 MG tablet Take 500 mg by mouth 3 (three) times daily.   Yes Historical Provider, MD  metoprolol succinate (TOPROL XL) 25 MG 24 hr tablet Take 1 tablet (25 mg total) by mouth daily. 05/22/16  Yes Eileen Stanford, PA-C  Multiple Vitamin (MULTIVITAMIN) tablet Take 1 tablet by mouth daily.     Yes Historical Provider, MD  sertraline (ZOLOFT) 25 MG tablet Take 1 tablet (25 mg total) by mouth at bedtime. 06/04/16  Yes Midge Minium, MD  simvastatin (ZOCOR) 20 MG tablet TAKE 1 TABLET BY MOUTH EVERY EVENING Patient taking differently: Take 20 mg by mouth every evening 05/28/16  Yes Midge Minium, MD  terconazole (TERAZOL 7) 0.4 % vaginal cream Place 1 applicator vaginally at bedtime as needed (itching).   Yes Historical Provider, MD  traMADol (ULTRAM) 50 MG tablet Take 50 mg by mouth every 6 (six) hours as needed for moderate pain.   Yes Historical Provider, MD  warfarin (COUMADIN) 5 MG tablet Take 2.5-5 mg by mouth See admin instructions. Pt  takes 2.5mg  on Monday, Friday, Saturday - pt takes 5mg  Tuesday, Thursday, Sunday   Yes Historical Provider, MD  clobetasol cream (TEMOVATE) 0.05 % APPLY EXTERNALLY AT BEDTIME FOR ONE MONTH THEN AS NEEDED FOR ITCHING Patient not taking: Reported on 06/09/2016 02/24/16   Anastasio Auerbach, MD  glucose blood (ONE TOUCH ULTRA TEST) test strip TEST TWICE A DAY AS DIRECTED 12/19/15   Midge Minium, MD  lidocaine (LIDODERM) 5 % Place 1 patch onto the skin daily as needed (for pain.). Remove & Discard patch within 12 hours or as directed by MD    Historical Provider, MD  lidocaine (XYLOCAINE) 5 % ointment APPLY 1 APPLICATION TOPICALLY 3 (THREE) TIMES DAILY AS NEEDED. FOR ITCHING DIRECTLY TO THE  SPOT Patient not taking: Reported on 06/09/2016 02/24/16   Anastasio Auerbach, MD  naproxen (NAPROSYN) 500 MG tablet Take 500 mg by mouth 2 (two) times daily as needed for mild pain.    Historical Provider, MD  ranitidine (ZANTAC) 150 MG tablet Take 1 tablet (150 mg total) by mouth 2 (two) times daily as needed for heartburn. Patient not taking: Reported on 06/09/2016 06/08/16   Leo Grosser, MD     Family History  Problem Relation Age of Onset  . Diabetes Mother   . Hypertension Mother   . Heart disease Father     MI age 79's   . Diabetes Brother   . Hypertension Brother     Social History   Social History  . Marital status: Married    Spouse name: N/A  . Number of children: N/A  . Years of education: N/A   Social History Main Topics  . Smoking status: Former Smoker    Packs/day: 0.50    Years: 60.00    Types: Cigarettes    Quit date: 03/18/2016  . Smokeless tobacco: Never Used  . Alcohol use No  . Drug use: No  . Sexual activity: Yes   Other Topics Concern  . None   Social History Narrative  . None    Review of Systems: A 12 point ROS discussed and pertinent positives are indicated in the HPI above.  All other systems are negative.  Review of Systems  Constitutional: Positive for activity change, appetite change and fatigue. Negative for fever.  Respiratory: Negative for shortness of breath.   Cardiovascular: Negative for chest pain.  Gastrointestinal: Positive for abdominal pain and nausea.  Neurological: Positive for weakness.  Psychiatric/Behavioral: Negative for behavioral problems and confusion.    Vital Signs: BP 131/68 (BP Location: Left Arm)   Pulse 69   Temp 98.7 F (37.1 C) (Oral)   Resp 18   Ht 5\' 3"  (1.6 m)   Wt 164 lb 12.8 oz (74.8 kg)   LMP  (LMP Unknown)   SpO2 98%   BMI 29.19 kg/m   Physical Exam  Constitutional: She is oriented to person, place, and time. She appears well-nourished.  Cardiovascular: Normal rate, regular rhythm and  normal heart sounds.   Pulmonary/Chest: Effort normal and breath sounds normal.  Abdominal: Soft. Bowel sounds are normal. There is tenderness.  Musculoskeletal: Normal range of motion.  Neurological: She is alert and oriented to person, place, and time.  Skin: Skin is warm and dry.  Psychiatric: She has a normal mood and affect. Her behavior is normal. Judgment and thought content normal.  Nursing note and vitals reviewed.   Mallampati Score:  MD Evaluation Airway: WNL Heart: WNL Abdomen: WNL Chest/ Lungs: WNL ASA  Classification:  3 Mallampati/Airway Score: One  Imaging: Ct Abdomen Pelvis Wo Contrast  Result Date: 06/09/2016 CLINICAL DATA:  Severe right-sided pain. EXAM: CT ABDOMEN AND PELVIS WITHOUT CONTRAST TECHNIQUE: Multidetector CT imaging of the abdomen and pelvis was performed following the standard protocol without IV contrast. COMPARISON:  None. 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 No other significant abnormalities in the lower chest. Hepatobiliary: Cholelithiasis is identified with mild distention of the gallbladder but no wall thickening or pericholecystic fluid. The liver is unremarkable. Pancreas: The pancreatic head, neck, and body are normal. There is abnormal soft tissue attenuation in the region of the pancreatic tail which is new since July of 2012. This masslike finding is between the stomach and spleen measuring 5.2 x 3.6 cm on image 25 with mild adjacent fat stranding and an attenuation of 28 Hounsfield units. This finding is new since 2012. Spleen: Normal in size without focal abnormality. Adrenals/Urinary Tract: Nodularity in the left adrenal gland is stable. The right adrenal gland is normal. There is a stone in the lower left kidney which is stable. No hydronephrosis. No ureterectasis or ureteral stones. Stomach/Bowel: The bladder is normal in appearance. The small bowel is unremarkable. Colonic diverticuli are identified.  There is significant stranding around the sigmoid colon with associated colonic wall thickening. These findings are consistent with diverticulitis. Evaluation for abscess is limited without contrast but none are seen. No extraluminal gas identified. The remainder of the colon is unremarkable. The visualized appendix is normal as well. Vascular/Lymphatic: Atherosclerosis is seen in the aorta as well as the iliac and femoral vessels. No aneurysm. No adenopathy. Reproductive: Uterus and bilateral adnexa are unremarkable. Other: No other abnormalities. Musculoskeletal: No acute bony abnormalities. Grade 1 anterolisthesis of L4 versus L5 is stable. IMPRESSION: 1. Sigmoid diverticulitis. 2. 5.2 x 3.6 cm masslike abnormality in the region of the pancreatic tail, between the spleen and the stomach. This is not well assessed without contrast. It is possible this could represent a complex fluid collection including a complex pseudocyst. However, a neoplasm is not excluded. Recommend clinical correlation. Recommend a CT scan with intravenous contrast for better assessment. 3. Cholelithiasis. 4. Atherosclerosis in the abdominal aorta. Electronically Signed   By: Dorise Bullion III M.D   On: 06/09/2016 22:26   Ct Abdomen Pelvis W Contrast  Result Date: 06/11/2016 CLINICAL DATA:  Right lower quadrant abdominal pain. Pancreatic mass. EXAM: CT ABDOMEN AND PELVIS WITH CONTRAST TECHNIQUE: Multidetector CT imaging of the abdomen and pelvis was performed using the standard protocol following bolus administration of intravenous contrast. CONTRAST:  100 cc Isovue 370 COMPARISON:  06/09/2016 CT scan FINDINGS: Lower chest: Trace bilateral pleural effusions with mild atelectasis in both lower lobes. Moderate cardiomegaly. Low-density blood pool. Hepatobiliary: Mildly distended gallbladder, 4.5 cm transverse diameter. Suspected small gallstones dependently in the gallbladder, images 29-32 series 7. No CBD dilatation. I do not see any  metastatic lesions in the liver. Pancreas: There is an ill-defined 6.2 by 7.0 by 6.0 cm irregularly marginated hypoenhancing mass most closely associated with the tail the pancreas, invading the splenic hilum, surrounding the splenic artery, and abutting and conceivably invading the elder layers of the gastric wall, images 20 through 41 series 3. 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 The mass extends to the margin of the left kidney and not  image 78/12 but does not appear to invade into the renal parenchyma as best I can tell. Irregular extension of tumor along the margins of the mass. The rest of the pancreas is atrophic. Spleen: The lower spleen is invaded by the pancreatic mass. Adrenals/Urinary Tract: The left adrenal gland appears involved with the pancreatic mass, thickened and directly tangential to the mass. Stomach/Bowel: The mass abuts and may be invading the gastric fundus. There is considerable active sigmoid diverticulitis with extensive local inflammation, stranding in adipose tissues, secondary inflammation of adjacent loops of bowel, and potentially a small amount of extraluminal gas posterior to the sigmoid colon which appears contained. Vascular/Lymphatic: No mass along the celiac trunk or SMA. No thrombosis of the portal vein or superior mesenteric vein. Conventional hepatic arterial vascular pattern with the right and left hepatic arteries arising from the proper hepatic artery which has a celiac origin. Aortoiliac atherosclerotic vascular disease. Left periaortic lymph node 0.7 cm in short axis on image 36/7, within normal limits. Mild focal ectasia of the distal most abdominal aorta. Reproductive: Potential secondary inflammation of pelvic structures due to the sigmoid diverticulitis. Other: There is some subtle areas of  nodularity along the left paracolic gutter which are very faint but merit surveillance. 2.2 by 1.2 cm nodular focus along the anterior peritoneal margins/ linea alba on image 28/13 could be postoperative scarring or tumor. There is also some nodularity along the umbilicus. Musculoskeletal: 6 mm anterolisthesis at L4-5 due to degenerative facet arthropathy. IMPRESSION: 1. 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 Invasion of the stomach fundus not excluded, renal invasion doubtful. Pancreatic adenocarcinoma is the most likely possibility, lymphoma possible but less likely, GI stromal tumor less likely. 2. There are several nodular enhancing lesions along the linea alba/umbilical region along with faint nodularity along the left paracolic gutter, cannot exclude metastatic lesions. I do not see any metastatic lesions to the liver. 3. 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 There is also fairly prominent sigmoid colon diverticulitis with a small amount of posterior extraluminal contained gas, but no free-flowing intraperitoneal gas. Extensive inflammatory findings surround the involved portion of the sigmoid colon with secondary inflammation of surrounding structures. 4. Trace bilateral pleural effusions.  Moderate cardiomegaly. 5. Low-density blood pool suggests anemia. 6. Mildly distended gallbladder. Small gallstones, as shown on recent ultrasound. Electronically Signed   By: Van Clines M.D.   On: 06/11/2016 11:20   US Abdomen Limited  Result Date: 06/10/2016 CLINICAL DATA:  Right-sided abdominal pain. EXAM: US ABDOMEN LIMITED - RIGHT UPPER QUADRANT COMPARISON:  CT scan from earlier today FINDINGS: Gallbladder: Shadowing stones are seen in the gallbladder with no  Murphy's sign, wall thickening, or pericholecystic fluid. Common bile duct: Diameter: 6.8 mm Liver: No focal lesion identified. Within normal limits in parenchymal echogenicity. IMPRESSION: 1. Cholelithiasis with no wall thickening, pericholecystic fluid, or Murphy's sign. 2. The common bile duct measures 6.8 mm which is prominent but within normal limits for a patient of this age. Recommend correlation with labs. Electronically Signed   By: Dorise Bullion III M.D   On: 06/10/2016 00:22   Dg Chest Port 1 View  Result Date: 06/09/2016 CLINICAL DATA:  Acute onset of generalized chest pain and right lower quadrant abdominal pain. Initial encounter. EXAM: PORTABLE CHEST 1 VIEW COMPARISON:  Chest radiograph performed 05/07/2016, and CTA of the chest performed 03/19/2016 FINDINGS: The lungs are well-aerated. Minimal bibasilar atelectasis is noted. There is no evidence of pleural effusion or pneumothorax. The cardiomediastinal silhouette is mildly enlarged. No acute osseous abnormalities are seen. IMPRESSION: Minimal bibasilar atelectasis noted. Lungs otherwise clear. Mild cardiomegaly. Electronically Signed   By: Garald Balding M.D.   On: 06/09/2016 21:18    Labs:  CBC:  Recent Labs  06/08/16 0753 06/09/16 2026 06/11/16 0446 06/12/16 0547  WBC 9.6 15.1* 6.9 6.5  HGB 11.2* 10.3* 8.4* 8.7*  HCT 35.6* 33.2* 27.9* 28.2*  PLT 285 287 229 267    COAGS:  Recent Labs  06/09/16 2026 06/10/16 0308 06/11/16 0446 06/12/16 0547  INR 2.03 2.28 2.95 1.74    BMP:  Recent Labs  06/08/16 0753 06/09/16 2026 06/10/16 0308 06/12/16 0547  NA 141 134* 141 136  K 3.7 3.9 3.6 3.7  CL 103 98* 106 102  CO2 27 24 25 27   GLUCOSE 146* 161* 136* 141*  BUN 11 12 9  <5*  CALCIUM 10.6* 10.6* 8.9 8.8*  CREATININE 0.71 0.99 0.70 0.61  GFRNONAA >60 54* >60 >60  GFRAA >60 >60 >60 >60    LIVER FUNCTION TESTS:  Recent Labs  05/07/16 1447 06/08/16 0753 06/09/16 2026 06/10/16 0308  BILITOT 0.2* 0.5 0.8  0.7  AST 25 20 19 16   ALT 13* 13* 12* 12*  ALKPHOS 48 48 43 38  PROT 6.8 6.7 6.4* 5.4*  ALBUMIN 4.0 4.2 3.7 3.0*    TUMOR MARKERS: No results for input(s): AFPTM, CEA, CA199, CHROMGRNA in the last 8760 hours.  Assessment and Plan:  Abdominal pain CT reveals pancreatic mass Now scheduled for biopsy in Rad 10/11 Will check INR in am (need to be 1.5 or lower) Risks and Benefits discussed with the patient including, but not limited to bleeding, infection, damage to adjacent structures or low yield requiring additional tests. All of the patient's questions were answered, patient is agreeable to proceed. Consent signed and in chart.   Thank you for this interesting consult.  I greatly enjoyed meeting Twanda B Bessire and look forward to participating in their care.  A copy of this report was sent to the requesting provider on this date.  Electronically Signed: Monia Sabal A 06/12/2016, 1:26 PM   I spent a total of 40 Minutes    in face to face in clinical consultation, greater than 50% of which was counseling/coordinating care for pancreatic mass biopsy

## 2016-06-12 NOTE — Consult Note (Signed)
[facility] Consult: 8:34 AM 06/12/2016  LOS: 2 days    Referring Provider: Dr Wynelle Cleveland.    Primary Care Physician:  Annye Asa, MD Primary Gastroenterologist:  Was Dr. Thomasenia Sales     Reason for Consultation:  Pancreatic tail mass.   HPI: Sarah Mcguire is a 75 y.o. female.  PMH DM 2.  Severe Anxiety, xanax dependent.  Started on Zoloft ~ 10 days ago for her nerves.  Oxygen requiring emphysema, COPD, quit smoking 03/2016.  A fib with RVR and hx DVT: on chronic Coumadin.  CHF, dilated cardiomyopathy; LVEF 25 - 30% per 05/2016 echo. CAD.  Cardiac cath 05/30/16: minor non-obstructive CAD, moderate global LV dysfunction.  Hypothyroidism.    4.2 cm ascending aortic aneurysm. Spinal stenosis. DJD.   Nephrolithiasis.  Prominent, likely reactive, mediastinal adenopathy on CT 03/2016. Hx diverticulitis.  09/2008 Colonoscopy, avg risk screen. Sigmoid to descending colon diverticulosis.    Presented and admitted 10/8 with less than 24 hours right abd pain.   06/10/16 ultrasound: cholelithiasis.  6.8 mm CBD: "prominent but WNL for age" 06/11/16 CT: 6.2 x 7 x 6 cm pancreatic tail mass surrounds splenic artery, envades spleen/left adrenal.  Abuts gastric fundus, upper left kidney.  Umbilical adenopathy.  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  Extensive sigmoid diverticulitis. Small gallstones.  LFTs are not elevated. Albumin slightly low.  Hgb 8.4.  MCV 86.  WBCs 15, now normal.   Coags today 20.6/1.7.       Pt says appetite is unchanged, but when pain started: associated anorexia and altered taste.  no n/v.  + chills and fever. Daily BMs, no blood.  Does not want an endoscopy if it can be avoided.     Past Medical History:  Diagnosis Date  . Anemia   . Anxiety    SEVERE  . Arthritis    "probably in my back" (05/08/2016)  . Atrial fibrillation with RVR ([place]) 11/28/2012  . Chronic lower back pain   . Diverticulitis   . DVT (deep venous thrombosis) ([place])   . Emphysema of lung ([place])   . GERD (gastroesophageal reflux disease)   . High cholesterol   . Hyperglycemia   . Hyperlipidemia   . Hypertension   . Leukocytosis   . Neuropathy ([place])   . On home oxygen therapy    "2L; 24/7 prn" (05/08/2016)  . PE (pulmonary embolism)    Bilateral PE, stringy saddle embolus with predominant proximal lower lobe pulmonary artery involvement 06/26/12  . Type II diabetes mellitus ([place])     Past Surgical History:  Procedure Laterality Date  . CARDIAC CATHETERIZATION N/A 05/30/2016   Procedure: Left Heart Cath and Coronary Angiography;  Surgeon: Peter M Martinique, MD;  Location: [facility];  Service: Cardiovascular;  Laterality: N/A;  . CATARACT EXTRACTION W/ INTRAOCULAR LENS  IMPLANT, BILATERAL Bilateral 08/2013  . JOINT REPLACEMENT    . TONSILLECTOMY    . TOTAL KNEE ARTHROPLASTY Left 11/24/2012   Procedure: TOTAL KNEE ARTHROPLASTY;  Surgeon: Kerin Salen, MD;  Location: [facility]  OR;  Service: Orthopedics;  Laterality: Left;  . TYMPANOSTOMY TUBE PLACEMENT Right early 2000s    Prior to Admission medications   Medication Sig Start Date End Date Taking? Authorizing Provider  acetaminophen (TYLENOL) 325 MG tablet Take 325-650 mg by mouth every 6 (six) hours as needed (for headache).    Yes Historical Provider, MD  albuterol (PROVENTIL HFA;VENTOLIN HFA) 108 (90 Base) MCG/ACT inhaler Inhale 2 puffs into the lungs every 6 (six) hours as needed for wheezing or shortness of breath. 04/17/16  Yes Tammy S Parrett, NP  alprazolam (XANAX) 2 MG tablet TAKE 1/2 TO 1 TABLET BY MOUTH 3 TIMES A DAY AS NEEDED FOR SLEEP OR ANXIETY Patient taking differently: TAKE 1 TABLET BY MOUTH THREE TIMES DAILY 05/11/16  Yes Midge Minium, MD  calcium citrate-vitamin D (CITRACAL+D) 315-200 MG-UNIT tablet Take 2 tablets by  mouth daily.   Yes Historical Provider, MD  Cholecalciferol (VITAMIN D) 2000 UNITS tablet Take 2,000 Units by mouth daily.   Yes Historical Provider, MD  esomeprazole (NEXIUM) 40 MG capsule Take 1 capsule (40 mg total) by mouth daily as needed. For acid reflux. 05/24/15  Yes Midge Minium, MD  fenofibrate 160 MG tablet Take 1 tablet (160 mg total) by mouth daily. 03/20/16  Yes Midge Minium, MD  Fluticasone Furoate-Vilanterol (BREO ELLIPTA IN) Inhale 1 puff into the lungs daily.   Yes Historical Provider, MD  folic acid (FOLVITE) 1 MG tablet Take 1 tablet (1 mg total) by mouth daily. 05/31/14  Yes Midge Minium, MD  furosemide (LASIX) 40 MG tablet TAKE 1 TABLET BY MOUTH EVERY DAY Patient taking differently: Take 20 mg by mouth every day 04/23/16  Yes Midge Minium, MD  HYDROcodone-acetaminophen (NORCO/VICODIN) 5-325 MG tablet Take 1-2 tablets by mouth every 4 (four) hours as needed. Patient taking differently: Take 1-2 tablets by mouth every 4 (four) hours as needed for moderate pain.  06/04/16  Yes Midge Minium, MD  levothyroxine (SYNTHROID, LEVOTHROID) 50 MCG tablet Take 1 tablet (50 mcg total) by mouth daily. Patient taking differently: Take 50 mcg by mouth daily before breakfast.  12/19/15  Yes Midge Minium, MD  lisinopril (PRINIVIL,ZESTRIL) 10 MG tablet TAKE 1 TABLET BY MOUTH EVERY DAY Patient taking differently: Take 10 mg by mouth every day 05/28/16  Yes Midge Minium, MD  meloxicam (MOBIC) 7.5 MG tablet Take 7.5 mg by mouth daily as needed for pain.   Yes Historical Provider, MD  metFORMIN (GLUCOPHAGE-XR) 500 MG 24 hr tablet Take 500-1,000 mg by mouth See admin instructions. Pt takes 1000mg  in morning after breakfast, 500mg  in evening after dinner   Yes Historical Provider, MD  methocarbamol (ROBAXIN) 500 MG tablet Take 500 mg by mouth 3 (three) times daily.   Yes Historical Provider, MD  metoprolol succinate (TOPROL XL) 25 MG 24 hr tablet Take 1 tablet (25 mg  total) by mouth daily. 05/22/16  Yes Eileen Stanford, PA-C  Multiple Vitamin (MULTIVITAMIN) tablet Take 1 tablet by mouth daily.     Yes Historical Provider, MD  sertraline (ZOLOFT) 25 MG tablet Take 1 tablet (25 mg total) by mouth at bedtime. 06/04/16  Yes Midge Minium, MD  simvastatin (ZOCOR) 20 MG tablet TAKE 1 TABLET BY MOUTH EVERY EVENING Patient taking differently: Take 20 mg by mouth every evening 05/28/16  Yes Midge Minium, MD  terconazole (TERAZOL 7) 0.4 % vaginal cream Place 1 applicator vaginally at bedtime as needed (itching).   Yes Historical Provider,  MD  traMADol (ULTRAM) 50 MG tablet Take 50 mg by mouth every 6 (six) hours as needed for moderate pain.   Yes Historical Provider, MD  warfarin (COUMADIN) 5 MG tablet Take 2.5-5 mg by mouth See admin instructions. Pt takes 2.5mg  on Monday, Friday, Saturday - pt takes 5mg  Tuesday, Thursday, Sunday   Yes Historical Provider, MD  clobetasol cream (TEMOVATE) 0.05 % APPLY EXTERNALLY AT BEDTIME FOR ONE MONTH THEN AS NEEDED FOR ITCHING Patient not taking: Reported on 06/09/2016 02/24/16   Anastasio Auerbach, MD  glucose blood (ONE TOUCH ULTRA TEST) test strip TEST TWICE A DAY AS DIRECTED 12/19/15   Midge Minium, MD  lidocaine (LIDODERM) 5 % Place 1 patch onto the skin daily as needed (for pain.). Remove & Discard patch within 12 hours or as directed by MD    Historical Provider, MD  lidocaine (XYLOCAINE) 5 % ointment APPLY 1 APPLICATION TOPICALLY 3 (THREE) TIMES DAILY AS NEEDED. FOR ITCHING DIRECTLY TO THE SPOT Patient not taking: Reported on 06/09/2016 02/24/16   Anastasio Auerbach, MD  naproxen (NAPROSYN) 500 MG tablet Take 500 mg by mouth 2 (two) times daily as needed for mild pain.    Historical Provider, MD  ranitidine (ZANTAC) 150 MG tablet Take 1 tablet (150 mg total) by mouth 2 (two) times daily as needed for heartburn. Patient not taking: Reported on 06/09/2016 06/08/16   Leo Grosser, MD    Scheduled Meds: . ciprofloxacin   400 mg Intravenous Q12H  . clotrimazole  1 Applicatorful Vaginal QHS  . fenofibrate  160 mg Oral Daily  . fluticasone furoate-vilanterol  1 puff Inhalation Daily  . folic acid  1 mg Oral Daily  .  HYDROmorphone (DILAUDID) injection  0.5 mg Intravenous Once  . Influenza vac split quadrivalent PF  0.5 mL Intramuscular Once  . insulin aspart  0-5 Units Subcutaneous QHS  . insulin aspart  0-9 Units Subcutaneous TID WC  . levothyroxine  50 mcg Oral QAC breakfast  . methocarbamol  500 mg Oral TID  . metoprolol succinate  25 mg Oral Daily  . metronidazole  500 mg Intravenous Q8H  . pantoprazole  40 mg Oral Daily  . sertraline  25 mg Oral QHS  . simvastatin  20 mg Oral QPM  . Warfarin - Pharmacist Dosing Inpatient   Does not apply q1800   Infusions:   PRN Meds: albuterol, alprazolam, HYDROcodone-acetaminophen, HYDROmorphone (DILAUDID) injection, ondansetron   Allergies as of 06/09/2016 - Review Complete 06/09/2016  Allergen Reaction Noted  . Morphine and related Other (See Comments) 11/24/2012  . Cymbalta [duloxetine hcl] Other (See Comments) 01/09/2012  . Amoxicillin Other (See Comments)   . Amoxicillin-pot clavulanate Other (See Comments) 03/21/2011  . Citalopram hydrobromide Itching   . Clonazepam Itching 03/21/2012  . Macrobid [nitrofurantoin macrocrystal] Nausea And Vomiting 02/18/2012  . Penicillins Other (See Comments) 03/23/2009  . Pyridium [phenazopyridine hcl] Other (See Comments) 03/21/2011  . Sertraline hcl Anxiety 05/14/2011  . Wellbutrin [bupropion] Itching, Rash, and Other (See Comments) 03/21/2012  . Zonegran Other (See Comments) 03/21/2011  . Zonisamide Other (See Comments)     Family History  Problem Relation Age of Onset  . Diabetes Mother   . Hypertension Mother   . Heart disease Father     MI age 69's   . Diabetes Brother   . Hypertension Brother     Social History   Social History  . Marital status: Married    Spouse name: N/A  . Number of  children: N/A  . Years of education: N/A   Occupational History  . Not on file.   Social History Main Topics  . Smoking status: Former Smoker    Packs/day: 0.50    Years: 60.00    Types: Cigarettes    Quit date: 03/18/2016  . Smokeless tobacco: Never Used  . Alcohol use No  . Drug use: No  . Sexual activity: Yes   Other Topics Concern  . Not on file   Social History Narrative  . No narrative on file    REVIEW OF SYSTEMS: Constitutional:  No weight loss.  Physically inactive.  ENT:  No nose bleeds.  Chronic use of phenylephrine nasal spray Pulm:  Stable DOE and mild cough.  Oxygen at night and during day if mobile CV:  No palpitations, no LE edema.  GU:  No hematuria, no frequency GI:  Some pill and solid dysphagia if her nerves are bothering her Heme:  No unusual bleeding or bruising   Transfusions:  None ever.  No unusual bleeding or bruising MS:  left knee pain chronic: uses APAP and hydrocodone several times per week.  Walks with a walker.  Neuro:  No headaches, no peripheral tingling or numbness.  Chronic anxiety and uses 1 mg xanax 6 times per day.  Derm:  No itching, no rash or sores.  Endocrine:  No polyuria or dysuria Immunization:  Reviewed. No flu shot yet this year.    PHYSICAL EXAM: Vital signs in last 24 hours: Vitals:   06/11/16 2056 06/12/16 0447  BP: (!) 145/81 131/68  Pulse: 77 69  Resp: 18 18  Temp: 97.6 F (36.4 C) 98.7 F (37.1 C)   Wt Readings from Last 3 Encounters:  06/12/16 74.8 kg (164 lb 12.8 oz)  05/30/16 73.5 kg (162 lb)  05/29/16 73.5 kg (162 lb)    General: anxious, depressed, pale, unwell but not acutely ill.  Comfortable WF Head:  No asymmetry or swelling  Eyes:  No icterus or pallor Ears:  Not obviously HOH  Nose:  No  Mouth:  Clear and moist Neck:  No mass, no JVD Lungs:  Diminished but clear, no cough or SOB Heart: RRR.  No mrg.  S1, s2 present Abdomen:  Soft, NT, ND.  No mass, no HSM.  No hernia or bruits.  BS  hypoactive.   Rectal: deferred   Musc/Skeltl: scar of TKR on left knee Extremities:  No CCE  Neurologic:  Alert, anxious, oriented x 3.  No limb weakness or tremor Skin:  No rash or sores Tattoos:  non3   Psych:  Anxious, depressed.  Cooperative.   Intake/Output from previous day: 10/09 0701 - 10/10 0700 In: -  Out: 3600 [Urine:3600] Intake/Output this shift: Total I/O In: -  Out: 300 [Urine:300]  LAB RESULTS:  Recent Labs  06/09/16 2026 06/11/16 0446 06/12/16 0547  WBC 15.1* 6.9 6.5  HGB 10.3* 8.4* 8.7*  HCT 33.2* 27.9* 28.2*  PLT 287 229 267   BMET Lab Results  Component Value Date   NA 136 06/12/2016   NA 141 06/10/2016   NA 134 (L) 06/09/2016   K 3.7 06/12/2016   K 3.6 06/10/2016   K 3.9 06/09/2016   CL 102 06/12/2016   CL 106 06/10/2016   CL 98 (L) 06/09/2016   CO2 27 06/12/2016   CO2 25 06/10/2016   CO2 24 06/09/2016   GLUCOSE 141 (H) 06/12/2016   GLUCOSE 136 (H) 06/10/2016   GLUCOSE 161 (H) 06/09/2016  BUN <5 (L) 06/12/2016   BUN 9 06/10/2016   BUN 12 06/09/2016   CREATININE 0.61 06/12/2016   CREATININE 0.70 06/10/2016   CREATININE 0.99 06/09/2016   CALCIUM 8.8 (L) 06/12/2016   CALCIUM 8.9 06/10/2016   CALCIUM 10.6 (H) 06/09/2016   LFT  Recent Labs  06/09/16 2026 06/10/16 0308  PROT 6.4* 5.4*  ALBUMIN 3.7 3.0*  AST 19 16  ALT 12* 12*  ALKPHOS 43 38  BILITOT 0.8 0.7   PT/INR Lab Results  Component Value Date   INR 1.74 06/12/2016   INR 2.95 06/11/2016   INR 2.28 06/10/2016   Hepatitis Panel No results for input(s): HEPBSAG, HCVAB, HEPAIGM, HEPBIGM in the last 72 hours. C-Diff No components found for: CDIFF Lipase     Component Value Date/Time   LIPASE 18 06/09/2016 2026    Drugs of Abuse  No results found for: LABOPIA, COCAINSCRNUR, LABBENZ, AMPHETMU, THCU, LABBARB   RADIOLOGY STUDIES: Ct Abdomen Pelvis W Contrast  Result Date: 06/11/2016 CLINICAL DATA:  Right lower quadrant abdominal pain. Pancreatic mass. EXAM: CT  ABDOMEN AND PELVIS WITH CONTRAST TECHNIQUE: Multidetector CT imaging of the abdomen and pelvis was performed using the standard protocol following bolus administration of intravenous contrast. CONTRAST:  100 cc Isovue 370 COMPARISON:  06/09/2016 CT scan FINDINGS: Lower chest: Trace bilateral pleural effusions with mild atelectasis in both lower lobes. Moderate cardiomegaly. Low-density blood pool. Hepatobiliary: Mildly distended gallbladder, 4.5 cm transverse diameter. Suspected small gallstones dependently in the gallbladder, images 29-32 series 7. No CBD dilatation. I do not see any metastatic lesions in the liver. Pancreas: There is an ill-defined 6.2 by 7.0 by 6.0 cm irregularly marginated hypoenhancing mass most closely associated with the tail the pancreas, invading the splenic hilum, surrounding the splenic artery, and abutting and conceivably invading the elder layers of the gastric wall, images 20 through 41 series 3. 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 The mass extends to the margin of the left kidney and not image 78/12 but does not appear to invade into the renal parenchyma as best I can tell. Irregular extension of tumor along the margins of the mass. The rest of the pancreas is atrophic. Spleen: The lower spleen is invaded by the pancreatic mass. Adrenals/Urinary Tract: The left adrenal gland appears involved with the pancreatic mass, thickened and directly tangential to the mass. Stomach/Bowel: The mass abuts and may be invading the gastric fundus. There is considerable active sigmoid diverticulitis with extensive local inflammation, stranding in adipose tissues, secondary inflammation of adjacent loops of bowel, and potentially a small amount of extraluminal gas posterior to the sigmoid colon which appears contained.  Vascular/Lymphatic: No mass along the celiac trunk or SMA. No thrombosis of the portal vein or superior mesenteric vein. Conventional hepatic arterial vascular pattern with the right and left hepatic arteries arising from the proper hepatic artery which has a celiac origin. Aortoiliac atherosclerotic vascular disease. Left periaortic lymph node 0.7 cm in short axis on image 36/7, within normal limits. Mild focal ectasia of the distal most abdominal aorta. Reproductive: Potential secondary inflammation of pelvic structures due to the sigmoid diverticulitis. Other: There is some subtle areas of nodularity along the left paracolic gutter which are very faint but merit surveillance. 2.2 by 1.2 cm nodular focus along the anterior peritoneal margins/ linea alba on image 28/13 could be  postoperative scarring or tumor. There is also some nodularity along the umbilicus. Musculoskeletal: 6 mm anterolisthesis at L4-5 due to degenerative facet arthropathy. IMPRESSION: 1. 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 Invasion of the stomach fundus not excluded, renal invasion doubtful. Pancreatic adenocarcinoma is the most likely possibility, lymphoma possible but less likely, GI stromal tumor less likely. 2. There are several nodular enhancing lesions along the linea alba/umbilical region along with faint nodularity along the left paracolic gutter, cannot exclude metastatic lesions. I do not see any metastatic lesions to the liver. 3. 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 There is also fairly prominent sigmoid colon diverticulitis with a small amount of posterior extraluminal contained gas, but no free-flowing intraperitoneal gas. Extensive inflammatory findings surround the involved portion  of the sigmoid colon with secondary inflammation of surrounding structures. 4. Trace bilateral pleural effusions.  Moderate cardiomegaly. 5. Low-density blood pool suggests anemia. 6. Mildly distended gallbladder. Small gallstones, as shown on recent ultrasound. Electronically Signed   By: Van Clines M.D.   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   CA 19-9 in process.  Oncology on board.   ? Safest and most expeditious approach to biopsy? EUS with FNA per Dr Ardis Hughs? perc biopsy per IR?   *  Diverticulitis.  Sigmoid colon.  On Flagyl, Cipro IV.  R abd pain and leukocytosis improved  *  Hx Afib with RVR.  Hx DVT, on Chronic Coumadin, on hold. INR today is 1.7.    *  Emphysema, copd, CHF: oxygen dependent for the most part.   *  Iron def anemia.    *  Non-ischemic CM.    *  Anxiety, benzo dependent.     PLAN:     *  Will discuss with Dr Carlean Purl.  Dr Ardis Hughs is not in office today, will return tomorrow, do not know what his EUS schedule looks like for this week.  Dr Vernard Gambles has reviewed films and lesion amenable to perc biopsy.  Note again that pt prefers to avoid endoscopy if possible.     Azucena Freed  06/12/2016, 8:34 AM Pager: [phone]    [facility] Attending   I have taken an interval history, reviewed the chart and examined the patient. I agree with the Advanced Practitioner's note, impression and recommendations.     New info from IR - now sounds like they think would be safer to pursue EUS. Will review w/ Dr. Ardis Hughs when he returns. Situation explained to patient and family multiple times. Patient's anxiety level very high.  Gatha Mayer, MD, [facility] [phone] (pager) [phone] after 5 PM, weekends and holidays  06/12/2016 6:32 PM

## 2016-06-13 ENCOUNTER — Encounter (HOSPITAL_COMMUNITY): Payer: Self-pay | Admitting: Internal Medicine

## 2016-06-13 ENCOUNTER — Telehealth: Payer: Self-pay | Admitting: Gastroenterology

## 2016-06-13 DIAGNOSIS — K5732 Diverticulitis of large intestine without perforation or abscess without bleeding: Principal | ICD-10-CM

## 2016-06-13 LAB — PROTIME-INR
INR: 1.34
Prothrombin Time: 16.7 seconds — ABNORMAL HIGH (ref 11.4–15.2)

## 2016-06-13 LAB — GLUCOSE, CAPILLARY
GLUCOSE-CAPILLARY: 162 mg/dL — AB (ref 65–99)
Glucose-Capillary: 149 mg/dL — ABNORMAL HIGH (ref 65–99)

## 2016-06-13 LAB — CANCER ANTIGEN 19-9: CA 19-9: 10291 U/mL — ABNORMAL HIGH (ref 0–35)

## 2016-06-13 MED ORDER — METRONIDAZOLE 500 MG PO TABS: 500.0000 mg | ORAL_TABLET | Freq: Three times a day (TID) | ORAL | 0 refills | Status: DC

## 2016-06-13 MED ORDER — CIPROFLOXACIN HCL 500 MG PO TABS: 500.0000 mg | ORAL_TABLET | Freq: Two times a day (BID) | ORAL | 0 refills | Status: DC

## 2016-06-13 MED ORDER — POLYETHYLENE GLYCOL 3350 17 G PO PACK
17.0000 g | PACK | Freq: Every day | ORAL | Status: DC | PRN
  Administered 2016-06-13: 17 g via ORAL
  Filled 2016-06-13: qty 1

## 2016-06-13 MED ORDER — ACETAMINOPHEN 325 MG PO TABS
650.0000 mg | ORAL_TABLET | ORAL | Status: DC | PRN
Start: 2016-06-13 — End: 2016-06-13
  Administered 2016-06-13: 650 mg via ORAL
  Filled 2016-06-13: qty 2

## 2016-06-13 MED ORDER — ENOXAPARIN SODIUM 80 MG/0.8ML ~~LOC~~ SOLN
80.0000 mg | Freq: Two times a day (BID) | SUBCUTANEOUS | Status: DC
  Administered 2016-06-13: 80 mg via SUBCUTANEOUS
  Filled 2016-06-13: qty 0.8

## 2016-06-13 MED ORDER — HYDROCODONE-ACETAMINOPHEN 5-325 MG PO TABS: 1.0000 | ORAL_TABLET | ORAL | 0 refills | Status: DC | PRN

## 2016-06-13 MED ORDER — METFORMIN HCL 500 MG PO TABS
1000.0000 mg | ORAL_TABLET | Freq: Every day | ORAL | Status: DC
  Administered 2016-06-13: 1000 mg via ORAL
  Filled 2016-06-13: qty 2

## 2016-06-13 MED ORDER — ONDANSETRON HCL 4 MG PO TABS
4.0000 mg | ORAL_TABLET | Freq: Once | ORAL | Status: AC
  Administered 2016-06-13: 4 mg via ORAL
  Filled 2016-06-13: qty 1

## 2016-06-13 MED ORDER — ENOXAPARIN SODIUM 80 MG/0.8ML ~~LOC~~ SOLN: 80.0000 mg | Freq: Two times a day (BID) | SUBCUTANEOUS | 0 refills | Status: DC

## 2016-06-13 MED ORDER — ALPRAZOLAM 0.5 MG PO TABS
1.5000 mg | ORAL_TABLET | Freq: Once | ORAL | Status: AC
  Administered 2016-06-13: 1.5 mg via ORAL

## 2016-06-13 MED ORDER — METFORMIN HCL 500 MG PO TABS: 500.0000 mg | ORAL_TABLET | Freq: Every day | ORAL | Status: DC

## 2016-06-13 MED ORDER — ALPRAZOLAM 2 MG PO TABS: 2.0000 mg | ORAL_TABLET | Freq: Three times a day (TID) | ORAL | 0 refills | Status: AC

## 2016-06-13 NOTE — Progress Notes (Signed)
   06/13/16 0100  Clinical Encounter Type  Visited With Patient  Visit Type Spiritual support  Referral From Nurse  Spiritual Encounters  Spiritual Needs Prayer;Emotional  Stress Factors  Patient Stress Factors Health changes;Family relationships  Medical staff called of Pt needing emotional support. Offered prayer of health and healing. Pt feeling scared and offered empathic listening.

## 2016-06-13 NOTE — Progress Notes (Signed)
          Daily Rounding Note  06/13/2016, 11:09 AM  LOS: 3 days   SUBJECTIVE:   Chief complaint: abd pain: resolved.   No further pain, eating solids: no nausea.  Family and pt ready for discharge   OBJECTIVE:         Vital signs in last 24 hours:    Temp:  [98 F (36.7 C)-98.3 F (36.8 C)] 98.3 F (36.8 C) (10/11 0500) Pulse Rate:  [84] 84 (10/10 1433) Resp:  [18] 18 (10/11 0500) BP: (124-149)/(60-79) 144/79 (10/11 0728) SpO2:  [98 %-99 %] 99 % (10/11 0500) Last BM Date: 06/11/16 Filed Weights   06/10/16 0233 06/12/16 0447  Weight: 73.7 kg (162 lb 8 oz) 74.8 kg (164 lb 12.8 oz)   General: pt did not speak unless directly questioned.  Did not engage at all   Heart: RRR Chest: clear bil.  No SOB Abdomen: NT, active BS.    Extremities: no CCE Neuro/Psych:  Depressed, flat affect.  Disengaged.    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   CA 19-9 10,291.  Oncology, Dr Burr Medico, on board.      *  Diverticulitis.  Sigmoid colon.  On Flagyl, Cipro IV. sxs inmproved.   *  Hx Afib with RVR.  Hx DVT, on Chronic Coumadin, on hold.   *  Emphysema, copd, CHF: oxygen dependent for the most part.   *  Iron def anemia.    *  Non-ischemic CM.    *  Anxiety, benzo dependent.      PLAN   *  After prolonged deliberation on part of IR, feeling is safest/best way to biopsy is via EUS.  Dr Ardis Hughs is working on arranging this for Thursday 10/19.  This can be cone as outpt.  She will need to be off Coumadin.  So probably best for her to  discharge on Lovenox and hold this after dose of 10/17.  *  Pt's husband repeatedly asked what time EUS/FNA would happen.  I continued to tell him and pt and son, dtr in law that scheduling is in the works and he would be given specifics.   Cell phones for contact are [phone] and [phone]  *  She can discharge today. Home with total 10 days of abx.       Azucena Freed  06/13/2016, 11:09 AM Pager: [phone]  Agree with Ms. Sandi Carne assessment and plan. Gatha Mayer, MD, [facility]

## 2016-06-13 NOTE — Discharge Summary (Signed)
Physician Discharge Summary  Sarah Mcguire [id number] DOB: 11/21/1940 DOA: 06/09/2016  PCP: Annye Asa, MD  Admit date: 06/09/2016 Discharge date: 06/13/2016  Recommendations for Outpatient Follow-up:  1. Pt will need to follow up with PCP in 2-3 weeks post discharge 2. Please obtain BMP to evaluate electrolytes and kidney function 3. Please also check CBC to evaluate Hg and Hct levels 4. Please note that pt was scheduled to have EUS done by Dr. Ardis Hughs on Oct 19th, 2017 5. Pt discharged on Lovenox with instructions to stop after the dose on Jun 19, 2016 6. Coumadin can be resumed after biopsy  7. Pt to complete ABX therapy for 10 more days post discharge   Discharge Diagnoses:  Principal Problem:   Sigmoid diverticulitis  Discharge Condition: Stable  Diet recommendation: Heart healthy diet discussed in details   Brief Narrative:  75 y/o female admitted for abdominal pain and diagnosed with sigmoid diverticulitis  Subjective: Very anxious still today about pancreas mass.   Assessment & Plan: Sigmoid diverticulitis/ leukocytosis - transitioned to oral Cipro and Flagyl  - pain controlled - diet advanced and pt tolerating well   Active Problems: Pancreatic mass - noted incidentally on CT - CT showing mass in pancreatic tail invading spleen, abutting stomach - after discussion with IR and GI team, recommendation was to proceed with EUS which will be scheduled with Dr. Ardis Hughs next week on Thursday October 19th, 2017 - have consulted Oncology, Dr Burr Medico, she will see once diagnosis established  - pt now on Lovenox and Coumadin discontinued temporarily until biopsy done   Anemia of iron deficiency   - Hb 11.2 >> 8.4- no obvious bleeding - on folic acid at home - anemia panel shows low Iron level sat, normal iron binding and normal Ferretin and AOCD   Urinary retention with Overflow incontinence - I and O cath revealed 1200 in bladder - Urology recommend keeping a  foley in for 2 wks and then doing a voiding trial in their office - patient and husband have declined foley - repeat bladder scan today for PVR  which was ~ 300-  no need for foley  Diabetes mellitus with neuropathy - reasonable inpatient control  Atrial flutter  - Warfarin-  will need to hold for procedure - placed on Lovenox until biopsy done   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  Hypothyroidism - synthroid  Chronic hypoxemic respiratory failure (HCC) COPD (chronic obstructive pulmonary disease) (HCC) - stable, cont O2, Nebs  Hypotension - resolved  Chronic systolic and diastolic CHF (congestive heart failure) (HCC) - EF [id number], Gr 1 diastolic dysfunction, RV EF moderately decreased - ECHO 9/17 - weight stable  Cholelithiases - incidental finding- d/w patient  Severe anxiety - Xanax per home medical regimen    Code Status: Full Code Family Communication: husband Disposition Plan: home  Consultants:   Oncology  GI Procedures:    Antimicrobials:   Procedures/Studies: Ct Abdomen Pelvis Wo Contrast  Result Date: 06/09/2016 CLINICAL DATA:  Severe right-sided pain. EXAM: CT ABDOMEN AND PELVIS WITHOUT CONTRAST TECHNIQUE: Multidetector CT imaging of the abdomen and pelvis was performed following the standard protocol without IV contrast. COMPARISON:  None. 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 No other significant abnormalities in the lower chest. Hepatobiliary: Cholelithiasis is identified with mild distention of the gallbladder but no wall thickening or pericholecystic fluid. The liver is unremarkable. Pancreas: The pancreatic head, neck, and body are normal. There is abnormal soft tissue attenuation  in the region of the pancreatic tail which is new since July of 2012. This masslike finding is between the stomach and spleen measuring 5.2 x 3.6 cm on image 25 with mild adjacent fat stranding and an attenuation of 28 Hounsfield  units. This finding is new since 2012. Spleen: Normal in size without focal abnormality. Adrenals/Urinary Tract: Nodularity in the left adrenal gland is stable. The right adrenal gland is normal. There is a stone in the lower left kidney which is stable. No hydronephrosis. No ureterectasis or ureteral stones. Stomach/Bowel: The bladder is normal in appearance. The small bowel is unremarkable. Colonic diverticuli are identified. There is significant stranding around the sigmoid colon with associated colonic wall thickening. These findings are consistent with diverticulitis. Evaluation for abscess is limited without contrast but none are seen. No extraluminal gas identified. The remainder of the colon is unremarkable. The visualized appendix is normal as well. Vascular/Lymphatic: Atherosclerosis is seen in the aorta as well as the iliac and femoral vessels. No aneurysm. No adenopathy. Reproductive: Uterus and bilateral adnexa are unremarkable. Other: No other abnormalities. Musculoskeletal: No acute bony abnormalities. Grade 1 anterolisthesis of L4 versus L5 is stable. IMPRESSION: 1. Sigmoid diverticulitis. 2. 5.2 x 3.6 cm masslike abnormality in the region of the pancreatic tail, between the spleen and the stomach. This is not well assessed without contrast. It is possible this could represent a complex fluid collection including a complex pseudocyst. However, a neoplasm is not excluded. Recommend clinical correlation. Recommend a CT scan with intravenous contrast for better assessment. 3. Cholelithiasis. 4. Atherosclerosis in the abdominal aorta. Electronically Signed   By: Dorise Bullion III M.D   On: 06/09/2016 22:26   Ct Abdomen Pelvis W Contrast  Result Date: 06/11/2016 CLINICAL DATA:  Right lower quadrant abdominal pain. Pancreatic mass. EXAM: CT ABDOMEN AND PELVIS WITH CONTRAST TECHNIQUE: Multidetector CT imaging of the abdomen and pelvis was performed using the standard protocol following bolus  administration of intravenous contrast. CONTRAST:  100 cc Isovue 370 COMPARISON:  06/09/2016 CT scan FINDINGS: Lower chest: Trace bilateral pleural effusions with mild atelectasis in both lower lobes. Moderate cardiomegaly. Low-density blood pool. Hepatobiliary: Mildly distended gallbladder, 4.5 cm transverse diameter. Suspected small gallstones dependently in the gallbladder, images 29-32 series 7. No CBD dilatation. I do not see any metastatic lesions in the liver. Pancreas: There is an ill-defined 6.2 by 7.0 by 6.0 cm irregularly marginated hypoenhancing mass most closely associated with the tail the pancreas, invading the splenic hilum, surrounding the splenic artery, and abutting and conceivably invading the elder layers of the gastric wall, images 20 through 41 series 3. 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 The mass extends to the margin of the left kidney and not image 78/12 but does not appear to invade into the renal parenchyma as best I can tell. Irregular extension of tumor along the margins of the mass. The rest of the pancreas is atrophic. Spleen: The lower spleen is invaded by the pancreatic mass. Adrenals/Urinary Tract: The left adrenal gland appears involved with the pancreatic mass, thickened and directly tangential to the mass. Stomach/Bowel: The mass abuts and may be invading the gastric fundus. There is considerable active sigmoid diverticulitis with extensive local inflammation, stranding in adipose tissues, secondary inflammation of adjacent loops of bowel, and potentially a small amount of extraluminal gas posterior to the  sigmoid colon which appears contained. Vascular/Lymphatic: No mass along the celiac trunk or SMA. No thrombosis of the portal vein or superior mesenteric vein. Conventional hepatic arterial vascular pattern  with the right and left hepatic arteries arising from the proper hepatic artery which has a celiac origin. Aortoiliac atherosclerotic vascular disease. Left periaortic lymph node 0.7 cm in short axis on image 36/7, within normal limits. Mild focal ectasia of the distal most abdominal aorta. Reproductive: Potential secondary inflammation of pelvic structures due to the sigmoid diverticulitis. Other: There is some subtle areas of nodularity along the left paracolic gutter which are very faint but merit surveillance. 2.2 by 1.2 cm nodular focus along the anterior peritoneal margins/ linea alba on image 28/13 could be postoperative scarring or tumor. There is also some nodularity along the umbilicus. Musculoskeletal: 6 mm anterolisthesis at L4-5 due to degenerative facet arthropathy. IMPRESSION: 1. 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 Invasion of the stomach fundus not excluded, renal invasion doubtful. Pancreatic adenocarcinoma is the most likely possibility, lymphoma possible but less likely, GI stromal tumor less likely. 2. There are several nodular enhancing lesions along the linea alba/umbilical region along with faint nodularity along the left paracolic gutter, cannot exclude metastatic lesions. I do not see any metastatic lesions to the liver. 3. 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 There is also fairly prominent sigmoid colon diverticulitis with a small amount of posterior extraluminal contained gas, but no free-flowing intraperitoneal gas. Extensive inflammatory findings surround the involved portion of the sigmoid colon with secondary inflammation of surrounding structures. 4. Trace bilateral pleural effusions.  Moderate cardiomegaly. 5. Low-density blood pool  suggests anemia. 6. Mildly distended gallbladder. Small gallstones, as shown on recent ultrasound. Electronically Signed   By: Van Clines M.D.   On: 06/11/2016 11:20   US Abdomen Limited  Result Date: 06/10/2016 CLINICAL DATA:  Right-sided abdominal pain. EXAM: US ABDOMEN LIMITED - RIGHT UPPER QUADRANT COMPARISON:  CT scan from earlier today FINDINGS: Gallbladder: Shadowing stones are seen in the gallbladder with no Murphy's sign, wall thickening, or pericholecystic fluid. Common bile duct: Diameter: 6.8 mm Liver: No focal lesion identified. Within normal limits in parenchymal echogenicity. IMPRESSION: 1. Cholelithiasis with no wall thickening, pericholecystic fluid, or Murphy's sign. 2. The common bile duct measures 6.8 mm which is prominent but within normal limits for a patient of this age. Recommend correlation with labs. Electronically Signed   By: Dorise Bullion III M.D   On: 06/10/2016 00:22   Dg Chest Port 1 View  Result Date: 06/09/2016 CLINICAL DATA:  Acute onset of generalized chest pain and right lower quadrant abdominal pain. Initial encounter. EXAM: PORTABLE CHEST 1 VIEW COMPARISON:  Chest radiograph performed 05/07/2016, and CTA of the chest performed 03/19/2016 FINDINGS: The lungs are well-aerated. Minimal bibasilar atelectasis is noted. There is no evidence of pleural effusion or pneumothorax. The cardiomediastinal silhouette is mildly enlarged. No acute osseous abnormalities are seen. IMPRESSION: Minimal bibasilar atelectasis noted. Lungs otherwise clear. Mild cardiomegaly. Electronically Signed   By: Garald Balding M.D.   On: 06/09/2016 21:18     Discharge Exam: Vitals:   06/13/16 0500 06/13/16 0728  BP: (!) 149/64 (!) 144/79  Pulse:    Resp: 18   Temp: 98.3 F (36.8 C)    Vitals:  06/12/16 1433 06/12/16 2125 06/13/16 0500 06/13/16 0728  BP: 124/60 127/67 (!) 149/64 (!) 144/79  Pulse: 84     Resp: 18 18 18    Temp: 98 F (36.7 C) 98.3 F (36.8 C) 98.3 F (36.8  C)   TempSrc: Oral Oral Oral   SpO2: 98% 99% 99%   Weight:      Height:        General: Pt is alert, follows commands appropriately, not in acute distress Cardiovascular: Regular rate and rhythm, S1/S2 +, no murmurs, no rubs, no gallops Respiratory: Clear to auscultation bilaterally, no wheezing, no crackles, no rhonchi Abdominal: Soft, non tender, non distended, bowel sounds +, no guarding  Discharge Instructions    Medication List    STOP taking these medications   clobetasol cream 0.05 % Commonly known as:  TEMOVATE   ranitidine 150 MG tablet Commonly known as:  ZANTAC   warfarin 5 MG tablet Commonly known as:  COUMADIN     TAKE these medications   acetaminophen 325 MG tablet Commonly known as:  TYLENOL Take 325-650 mg by mouth every 6 (six) hours as needed (for headache).   albuterol 108 (90 Base) MCG/ACT inhaler Commonly known as:  PROVENTIL HFA;VENTOLIN HFA Inhale 2 puffs into the lungs every 6 (six) hours as needed for wheezing or shortness of breath.   alprazolam 2 MG tablet Commonly known as:  XANAX Take 1 tablet (2 mg total) by mouth 3 (three) times daily. What changed:  See the new instructions.   BREO ELLIPTA IN Inhale 1 puff into the lungs daily.   calcium citrate-vitamin D 315-200 MG-UNIT tablet Commonly known as:  CITRACAL+D Take 2 tablets by mouth daily.   ciprofloxacin 500 MG tablet Commonly known as:  CIPRO Take 1 tablet (500 mg total) by mouth 2 (two) times daily.   enoxaparin 80 MG/0.8ML injection Commonly known as:  LOVENOX Inject 0.8 mLs (80 mg total) into the skin every 12 (twelve) hours. Stop taking after the dose on October 17th, 2017   esomeprazole 40 MG capsule Commonly known as:  NEXIUM Take 1 capsule (40 mg total) by mouth daily as needed. For acid reflux.   fenofibrate 160 MG tablet Take 1 tablet (160 mg total) by mouth daily.   folic acid 1 MG tablet Commonly known as:  FOLVITE Take 1 tablet (1 mg total) by mouth  daily.   furosemide 40 MG tablet Commonly known as:  LASIX TAKE 1 TABLET BY MOUTH EVERY DAY What changed:  See the new instructions.   glucose blood test strip Commonly known as:  ONE TOUCH ULTRA TEST TEST TWICE A DAY AS DIRECTED   HYDROcodone-acetaminophen 5-325 MG tablet Commonly known as:  NORCO/VICODIN Take 1-2 tablets by mouth every 4 (four) hours as needed. What changed:  reasons to take this   levothyroxine 50 MCG tablet Commonly known as:  SYNTHROID, LEVOTHROID Take 1 tablet (50 mcg total) by mouth daily. What changed:  when to take this   lidocaine 5 % Commonly known as:  LIDODERM Place 1 patch onto the skin daily as needed (for pain.). Remove & Discard patch within 12 hours or as directed by MD What changed:  Another medication with the same name was removed. Continue taking this medication, and follow the directions you see here.   lisinopril 10 MG tablet Commonly known as:  PRINIVIL,ZESTRIL TAKE 1 TABLET BY MOUTH EVERY DAY What changed:  See the new instructions.   meloxicam 7.5 MG tablet Commonly known as:  MOBIC Take 7.5 mg by mouth daily as needed for pain.   metFORMIN 500 MG 24 hr tablet Commonly known as:  GLUCOPHAGE-XR Take 500-1,000 mg by mouth See admin instructions. Pt takes 1000mg  in morning after breakfast, 500mg  in evening after dinner   methocarbamol 500 MG tablet Commonly known as:  ROBAXIN Take 500 mg by mouth 3 (three) times daily.   metoprolol succinate 25 MG 24 hr tablet Commonly known as:  TOPROL XL Take 1 tablet (25 mg total) by mouth daily.   metroNIDAZOLE 500 MG tablet Commonly known as:  FLAGYL Take 1 tablet (500 mg total) by mouth every 8 (eight) hours.   multivitamin tablet Take 1 tablet by mouth daily.   naproxen 500 MG tablet Commonly known as:  NAPROSYN Take 500 mg by mouth 2 (two) times daily as needed for mild pain.   sertraline 25 MG tablet Commonly known as:  ZOLOFT Take 1 tablet (25 mg total) by mouth at  bedtime.   simvastatin 20 MG tablet Commonly known as:  ZOCOR TAKE 1 TABLET BY MOUTH EVERY EVENING What changed:  See the new instructions.   terconazole 0.4 % vaginal cream Commonly known as:  TERAZOL 7 Place 1 applicator vaginally at bedtime as needed (itching).   traMADol 50 MG tablet Commonly known as:  ULTRAM Take 50 mg by mouth every 6 (six) hours as needed for moderate pain.   Vitamin D 2000 units tablet Take 2,000 Units by mouth daily.       Follow-up Information    Annye Asa, MD .   Specialty:  Family Medicine Contact information: [address] [place] [address] [phone]          The results of significant diagnostics from this hospitalization (including imaging, microbiology, ancillary and laboratory) are listed below for reference.     Microbiology: Recent Results (from the past 240 hour(s))  Urine Culture     Status: None   Collection Time: 06/04/16 12:19 PM  Result Value Ref Range Status   Organism ID, Bacteria NO GROWTH  Final  Blood Culture (routine x 2)     Status: None (Preliminary result)   Collection Time: 06/09/16  9:20 PM  Result Value Ref Range Status   Specimen Description BLOOD RIGHT HAND  Final   Special Requests BOTTLES DRAWN AEROBIC ONLY 5ML  Final   Culture NO GROWTH 3 DAYS  Final   Report Status PENDING  Incomplete  Blood Culture (routine x 2)     Status: None (Preliminary result)   Collection Time: 06/09/16  9:35 PM  Result Value Ref Range Status   Specimen Description BLOOD LEFT HAND  Final   Special Requests BOTTLES DRAWN AEROBIC ONLY 5ML  Final   Culture NO GROWTH 3 DAYS  Final   Report Status PENDING  Incomplete  Urine culture     Status: Abnormal   Collection Time: 06/09/16 10:23 PM  Result Value Ref Range Status   Specimen Description URINE, CLEAN CATCH  Final   Special Requests NONE  Final   Culture MULTIPLE SPECIES PRESENT, SUGGEST RECOLLECTION (A)  Final   Report Status 06/11/2016 FINAL  Final      Labs: Basic Metabolic Panel:  Recent Labs Lab 06/08/16 0753 06/09/16 2026 06/10/16 0308 06/12/16 0547  NA 141 134* 141 136  K 3.7 3.9 3.6 3.7  CL 103 98* 106 102  CO2 27 24 25 27   GLUCOSE 146* 161* 136* 141*  BUN 11 12 9  <5*  CREATININE 0.71 0.99 0.70 0.61  CALCIUM 10.6* 10.6* 8.9 8.8*   Liver Function Tests:  Recent Labs Lab 06/08/16 0753 06/09/16 2026 06/10/16 0308  AST 20 19 16   ALT 13* 12* 12*  ALKPHOS 48 43 38  BILITOT 0.5 0.8 0.7  PROT 6.7 6.4* 5.4*  ALBUMIN 4.2 3.7 3.0*    Recent Labs Lab 06/08/16 0753 06/09/16 2026  LIPASE 21 18   CBC:  Recent Labs Lab 06/08/16 0753 06/09/16 2026 06/11/16 0446 06/12/16 0547  WBC 9.6 15.1* 6.9 6.5  NEUTROABS 6.6  --   --   --   HGB 11.2* 10.3* 8.4* 8.7*  HCT 35.6* 33.2* 27.9* 28.2*  MCV 85.6 86.5 87.5 86.8  PLT 285 287 229 267   BNP (last 3 results)  Recent Labs  03/19/16 0017 05/07/16 1447  BNP 184.9* 234.9*    ProBNP (last 3 results) No results for input(s): PROBNP in the last 8760 hours.  CBG:  Recent Labs Lab 06/12/16 0744 06/12/16 1156 06/12/16 1620 06/12/16 2230 06/13/16 0814  GLUCAP 151* 123* 144* 126* 162*   SIGNED: Time coordinating discharge:  30 minutes  MAGICK-MYERS, ISKRA, MD  [facility] 06/13/2016, 11:31 AM Pager [phone]  If 7PM-7AM, please contact night-coverage [email or web address] Password [email or web address]

## 2016-06-13 NOTE — Progress Notes (Signed)
Discharge home after tolerating lunch. Husband instructed about lovenox, patient showed this Probation officer how to administer injection. Home discharge instruction given, no question verbalized.

## 2016-06-13 NOTE — Discharge Instructions (Signed)
Diverticulitis °Diverticulitis is inflammation or infection of small pouches in your colon that form when you have a condition called diverticulosis. The pouches in your colon are called diverticula. Your colon, or large intestine, is where water is absorbed and stool is formed. °Complications of diverticulitis can include: °· Bleeding. °· Severe infection. °· Severe pain. °· Perforation of your colon. °· Obstruction of your colon. °CAUSES  °Diverticulitis is caused by bacteria. °Diverticulitis happens when stool becomes trapped in diverticula. This allows bacteria to grow in the diverticula, which can lead to inflammation and infection. °RISK FACTORS °People with diverticulosis are at risk for diverticulitis. Eating a diet that does not include enough fiber from fruits and vegetables may make diverticulitis more likely to develop. °SYMPTOMS  °Symptoms of diverticulitis may include: °· Abdominal pain and tenderness. The pain is normally located on the left side of the abdomen, but may occur in other areas. °· Fever and chills. °· Bloating. °· Cramping. °· Nausea. °· Vomiting. °· Constipation. °· Diarrhea. °· Blood in your stool. °DIAGNOSIS  °Your health care provider will ask you about your medical history and do a physical exam. You may need to have tests done because many medical conditions can cause the same symptoms as diverticulitis. Tests may include: °· Blood tests. °· Urine tests. °· Imaging tests of the abdomen, including X-rays and CT scans. °When your condition is under control, your health care provider may recommend that you have a colonoscopy. A colonoscopy can show how severe your diverticula are and whether something else is causing your symptoms. °TREATMENT  °Most cases of diverticulitis are mild and can be treated at home. Treatment may include: °· Taking over-the-counter pain medicines. °· Following a clear liquid diet. °· Taking antibiotic medicines by mouth for 7-10 days. °More severe cases may  be treated at a hospital. Treatment may include: °· Not eating or drinking. °· Taking prescription pain medicine. °· Receiving antibiotic medicines through an IV tube. °· Receiving fluids and nutrition through an IV tube. °· Surgery. °HOME CARE INSTRUCTIONS  °· Follow your health care provider's instructions carefully. °· Follow a full liquid diet or other diet as directed by your health care provider. After your symptoms improve, your health care provider may tell you to change your diet. He or she may recommend you eat a high-fiber diet. Fruits and vegetables are good sources of fiber. Fiber makes it easier to pass stool. °· Take fiber supplements or probiotics as directed by your health care provider. °· Only take medicines as directed by your health care provider. °· Keep all your follow-up appointments. °SEEK MEDICAL CARE IF:  °· Your pain does not improve. °· You have a hard time eating food. °· Your bowel movements do not return to normal. °SEEK IMMEDIATE MEDICAL CARE IF:  °· Your pain becomes worse. °· Your symptoms do not get better. °· Your symptoms suddenly get worse. °· You have a fever. °· You have repeated vomiting. °· You have bloody or black, tarry stools. °MAKE SURE YOU:  °· Understand these instructions. °· Will watch your condition. °· Will get help right away if you are not doing well or get worse. °  °This information is not intended to replace advice given to you by your health care provider. Make sure you discuss any questions you have with your health care provider. °  °Document Released: 05/30/2005 Document Revised: 08/25/2013 Document Reviewed: 07/15/2013 °Elsevier Interactive Patient Education ©2016 [employer] ° °

## 2016-06-13 NOTE — Consult Note (Signed)
   [facility] CM Inpatient Consult   06/13/2016  Sarah Mcguire July 01, 1941 [id number]   Patient evaluated for long-term disease management services with [facility] program. Martin Majestic to bedside to discuss and offer [facility] services. Patient is agreeable and written consent signed. Explained to patient that she will receive post hospital transition of care calls and will be evaluated for monthly home visits. Confirmed Primary Care MD as Dr. Birdie Riddle  Confirmed best contact number as [phone]  or [phone].   Left [facility] Care Management packet and contact information at bedside. Will make inpatient RNCM aware that patient will be followed by [facility] post hospital discharge.  Mr. Saintlouis reports patient is supposed to have a biopsy next Thursday. They deny issues with obtainind medications or with transportation. [facility] Care Management follow up for multiple hospitalizations. History of HTN, Afib, DM. Will request to be assigned to [facility]. Both patient and husband report she is supposed to discharge home today.    Marthenia Rolling, MSN-Ed, RN,BSN [facility] Liaison [phone]

## 2016-06-13 NOTE — Telephone Encounter (Signed)
Patty,  Can you contact her about EUS next Thursday for pancreatic tail mass.  WL, ++MAC.  She is currently in hospital on lovenox twice daily. She should stay on that and hold it 24hours prior to the EUS.  She will probably be discharged today.  The family is eager to get the procedure scheduled.  Thanks  Cell phones for contact are [phone] and [phone]  DJ

## 2016-06-13 NOTE — Progress Notes (Addendum)
ANTICOAGULATION CONSULT NOTE - Follow Up Consult  Pharmacy Consult:  Coumadin- on hold Indication:  History of Afib and VTE  Allergies  Allergen Reactions  . Morphine And Related Other (See Comments)    Usually drops her blood pressure  . Cymbalta [Duloxetine Hcl] Other (See Comments)    Nausea, headache and diarrhea   . Amoxicillin Other (See Comments)    Unknown Has patient had a PCN reaction causing immediate rash, facial/tongue/throat swelling, SOB or lightheadedness with hypotension:unsure Has patient had a PCN reaction causing severe rash involving mucus membranes or skin necrosis:unsure Has patient had a PCN reaction that required hospitalization:unsure Has patient had a PCN reaction occurring within the last 10 years:unsure If all of the above answers are "NO", then may proceed with Cephalosporin use.     Marland Kitchen Amoxicillin-Pot Clavulanate Other (See Comments)    Has patient had a PCN reaction causing immediate rash, facial/tongue/throat swelling, SOB or lightheadedness with hypotension:unsure Has patient had a PCN reaction causing severe rash involving mucus membranes or skin necrosis:unsure Has patient had a PCN reaction that required hospitalization:unsure Has patient had a PCN reaction occurring within the last 10 years:unsure If all of the above answers are "NO", then may proceed with Cephalosporin use.    . Citalopram Hydrobromide Itching       . Clonazepam Itching  . Macrobid [Nitrofurantoin Macrocrystal] Nausea And Vomiting  . Penicillins Other (See Comments)    Unknown Has patient had a PCN reaction causing immediate rash, facial/tongue/throat swelling, SOB or lightheadedness with hypotension:unsure Has patient had a PCN reaction causing severe rash involving mucus membranes or skin necrosis:unsure Has patient had a PCN reaction that required hospitalization:unsure Has patient had a PCN reaction occurring within the last 10 years:unsure If all of the above answers  are "NO", then may proceed with Cephalosporin use.      . Pyridium [Phenazopyridine Hcl] Other (See Comments)    headache  . Sertraline Hcl Anxiety  . Wellbutrin [Bupropion] Itching, Rash and Other (See Comments)    Itching, rash, hyper   . Zonegran Other (See Comments)    Unknown   . Zonisamide Other (See Comments)    Unknown     Patient Measurements: Height: 5\' 3"  (160 cm) Weight: 164 lb 12.8 oz (74.8 kg) IBW/kg (Calculated) : 52.4  Vital Signs: Temp: 98.3 F (36.8 C) (10/11 0500) Temp Source: Oral (10/11 0500) BP: 144/79 (10/11 0728)  Labs:  Recent Labs  06/11/16 0446 06/12/16 0547 06/13/16 0332  HGB 8.4* 8.7*  --   HCT 27.9* 28.2*  --   PLT 229 267  --   LABPROT 31.3* 20.6* 16.7*  INR 2.95 1.74 1.34  CREATININE  --  0.61  --     Estimated Creatinine Clearance: 58 mL/min (by C-G formula based on SCr of 0.61 mg/dL).    Assessment: 31 YOF on Coumadin from [facility] for history of Afib, DVT and PE. Coumadin is currently on hold.  Vitamin K 1 mg IV given 10/9 in preparation for biopsy for pancreatic mass/ INR 1.34, last Hg 8.67. No bleeding reported. Per notes bx planned for 10/19  Goal of Thereapy:  INR 2-3   Plan:  Hold Coumadin for pending biopsy Consider LMWH bridge therapy while on hold with plans to hold 24 hrs prior to bx  Eudelia Bunch, Pharm.D. [phone] 06/13/2016 9:41 AM   Addendum: LMWH for bridge therapy ordered per pharmacy. Wt 75 kg. LMWH comes pre-filled in 80 mg syringes. Creat cl > 30 ml/min.  Plan:  LMWH 80 mg sq q12h  Eudelia Bunch, Pharm.D. BP:[id number] 06/13/2016 11:42 AM

## 2016-06-13 NOTE — Care Management (Signed)
Enoxaparin: no auth required/ $85.17   Lovenox: no auth requried/ $95   Patient can use any retail pharmacy   Previous Messages    -                  Lovenox 80 mg SQ every 12 hours x 7 days ( 14 doses )  Patient's husband aware and said Lovenox is OK.   Magdalen Spatz RN BSN [phone]

## 2016-06-13 NOTE — Progress Notes (Signed)
   I have communicated with Dr. Barbie Banner of IR and Dr. Ardis Hughs who does EUS in our group.  Safest approach to biopsy is thought to be EUS.  That can be done Thursday 10/19 as out patient.  Anticoagulation Tx will need to be addressed - would need to be on a short acting agent that is clear of system prior to EUS/FNA  We will work on this today  I ordered low fiber diet  Gatha Mayer, MD, [facility] [phone] (pager) [phone] after 5 PM, weekends and holidays  06/13/2016 8:45 AM

## 2016-06-14 ENCOUNTER — Telehealth: Payer: Self-pay | Admitting: *Deleted

## 2016-06-14 ENCOUNTER — Other Ambulatory Visit: Payer: Self-pay

## 2016-06-14 ENCOUNTER — Other Ambulatory Visit: Payer: Self-pay | Admitting: *Deleted

## 2016-06-14 ENCOUNTER — Telehealth: Payer: Self-pay | Admitting: Behavioral Health

## 2016-06-14 DIAGNOSIS — K8689 Other specified diseases of pancreas: Secondary | ICD-10-CM

## 2016-06-14 LAB — CULTURE, BLOOD (ROUTINE X 2)
CULTURE: NO GROWTH
Culture: NO GROWTH

## 2016-06-14 NOTE — Patient Outreach (Signed)
Transition of care: Placed call to patient who identified herself.  Reports that she can not talk right now she has company and is eating dinner. Request call back tomorrow afternoon.  PLAN: Will attempt to reach patient tomorrow afternoon as requested by patient.  Tomasa Rand, RN, BSN, CEN [facility] [employer] [phone]

## 2016-06-14 NOTE — Telephone Encounter (Signed)
Per Dr. Ardis Hughs, I scheduled the EUS for 10-19 at [facility].  Spoke to [place] at the Endo unit.  The time is 10:30 am. He is to arrive at 9:00 am. Duffy Rhody, the patient's husband and gave him the date and time .  Also advised the patient is to stay on the Lovenox injections but  hold them 24 hours ahead of time.  I went over the directions. She is to have no food after midnight. She can have clear liquids up to 6:30 am the day of the EUS.  I also advised her to take her medications prior to 6:30 am that morning.  Shanon Brow verbalized understanding the directions and repeated them to me.  I told her to call our office and ask for Dr. Eugenia Pancoast nurse Chong Sicilian if they have any questions prior to the EUS.

## 2016-06-14 NOTE — Telephone Encounter (Signed)
Patient declined TCM/Hospital Follow-up at this time. 

## 2016-06-15 ENCOUNTER — Other Ambulatory Visit: Payer: Self-pay

## 2016-06-15 ENCOUNTER — Telehealth: Payer: Self-pay | Admitting: General Practice

## 2016-06-15 ENCOUNTER — Telehealth: Payer: Self-pay | Admitting: *Deleted

## 2016-06-15 ENCOUNTER — Encounter (HOSPITAL_COMMUNITY): Payer: Self-pay | Admitting: *Deleted

## 2016-06-15 ENCOUNTER — Emergency Department (HOSPITAL_COMMUNITY)
Admission: EM | Admit: 2016-06-15 | Discharge: 2016-06-16 | Disposition: A | Discharge status: Home / Self Care | Payer: Medicare Other | Attending: Emergency Medicine | Admitting: Emergency Medicine

## 2016-06-15 ENCOUNTER — Encounter (HOSPITAL_COMMUNITY): Payer: Self-pay | Admitting: Emergency Medicine

## 2016-06-15 DIAGNOSIS — I110 Hypertensive heart disease with heart failure: Secondary | ICD-10-CM | POA: Insufficient documentation

## 2016-06-15 DIAGNOSIS — Z96652 Presence of left artificial knee joint: Secondary | ICD-10-CM | POA: Insufficient documentation

## 2016-06-15 DIAGNOSIS — J449 Chronic obstructive pulmonary disease, unspecified: Secondary | ICD-10-CM | POA: Diagnosis not present

## 2016-06-15 DIAGNOSIS — I5022 Chronic systolic (congestive) heart failure: Secondary | ICD-10-CM | POA: Diagnosis not present

## 2016-06-15 DIAGNOSIS — R1013 Epigastric pain: Secondary | ICD-10-CM

## 2016-06-15 DIAGNOSIS — Z87891 Personal history of nicotine dependence: Secondary | ICD-10-CM | POA: Insufficient documentation

## 2016-06-15 DIAGNOSIS — I2510 Atherosclerotic heart disease of native coronary artery without angina pectoris: Secondary | ICD-10-CM | POA: Insufficient documentation

## 2016-06-15 DIAGNOSIS — E039 Hypothyroidism, unspecified: Secondary | ICD-10-CM | POA: Insufficient documentation

## 2016-06-15 DIAGNOSIS — E1140 Type 2 diabetes mellitus with diabetic neuropathy, unspecified: Secondary | ICD-10-CM | POA: Diagnosis not present

## 2016-06-15 LAB — COMPREHENSIVE METABOLIC PANEL
ALBUMIN: 3.9 g/dL (ref 3.5–5.0)
ALK PHOS: 43 U/L (ref 38–126)
ALT: 12 U/L — AB (ref 14–54)
AST: 25 U/L (ref 15–41)
Anion gap: 14 (ref 5–15)
BUN: 8 mg/dL (ref 6–20)
CALCIUM: 10.2 mg/dL (ref 8.9–10.3)
CHLORIDE: 92 mmol/L — AB (ref 101–111)
CO2: 23 mmol/L (ref 22–32)
CREATININE: 0.85 mg/dL (ref 0.44–1.00)
GFR calc Af Amer: >60 mL/min (ref >60)
GFR calc non Af Amer: >60 mL/min (ref >60)
GLUCOSE: 118 mg/dL — AB (ref 65–99)
Potassium: 3.9 mmol/L (ref 3.5–5.1)
SODIUM: 129 mmol/L — AB (ref 135–145)
Total Bilirubin: 0.3 mg/dL (ref 0.3–1.2)
Total Protein: 6.6 g/dL (ref 6.5–8.1)

## 2016-06-15 LAB — URINALYSIS, ROUTINE W REFLEX MICROSCOPIC
BILIRUBIN URINE: NEGATIVE
GLUCOSE, UA: NEGATIVE mg/dL
HGB URINE DIPSTICK: NEGATIVE
Ketones, ur: NEGATIVE mg/dL
Leukocytes, UA: NEGATIVE
Nitrite: NEGATIVE
Protein, ur: NEGATIVE mg/dL
SPECIFIC GRAVITY, URINE: 1.004 — AB (ref 1.005–1.030)
pH: 7 (ref 5.0–8.0)

## 2016-06-15 LAB — CBC
HCT: 33.3 % — ABNORMAL LOW (ref 36.0–46.0)
HEMOGLOBIN: 10.8 g/dL — AB (ref 12.0–15.0)
MCH: 26.9 pg (ref 26.0–34.0)
MCHC: 32.4 g/dL (ref 30.0–36.0)
MCV: 82.8 fL (ref 78.0–100.0)
PLATELETS: 446 10*3/uL — AB (ref 150–400)
RBC: 4.02 MIL/uL (ref 3.87–5.11)
RDW: 16.2 % — ABNORMAL HIGH (ref 11.5–15.5)
WBC: 9.3 10*3/uL (ref 4.0–10.5)

## 2016-06-15 LAB — LIPASE, BLOOD: Lipase: 17 U/L (ref 11–51)

## 2016-06-15 MED ORDER — SODIUM CHLORIDE 0.9 % IV BOLUS (SEPSIS)
500.0000 mL | Freq: Once | INTRAVENOUS | Status: AC
  Administered 2016-06-15: 500 mL via INTRAVENOUS

## 2016-06-15 MED ORDER — FENTANYL CITRATE (PF) 100 MCG/2ML IJ SOLN
50.0000 ug | Freq: Once | INTRAMUSCULAR | Status: AC
  Administered 2016-06-15: 50 ug via INTRAVENOUS
  Filled 2016-06-15: qty 2

## 2016-06-15 NOTE — ED Triage Notes (Addendum)
C/o severe upper abd pain since this afternoon with nausea and abd swelling.  Pt reports urinary retention- she was able to provide urine sample at triage.  Family reports pt recently discharged from hospital for abd pain- diverticulitis, pancreatic mass, and gallstones.

## 2016-06-15 NOTE — ED Provider Notes (Signed)
Emergency Department Provider Note   I have reviewed the triage vital signs and the nursing notes.   HISTORY  Chief Complaint Abdominal Pain and Urinary Retention   HPI Sarah Mcguire is a 75 y.o. female with PMH of anxiety, a-fib on coumadin, CAD, GERD, DM, and recent admission for diverticulitis and pancreatic mass presents to the emergency department for evaluation of epigastric abdominal discomfort and urinary retention. The patient was discharged home with follow-up plan with gastroenterology to biopsy the pancreatic mass. She was also discharged home with oral antibiotics, Cipro and Flagyl, for the diverticulitis. The patient's husband states that she seemed to be getting better until today when she is complaining again of abdominal discomfort and felt like she could not urinate. She denies any fever or chills. The patient's describing diffuse swelling in the abdomen that is relieved with urination. No dysuria, hesitancy, urgency. Family denies any altered mental status. She continues to eat and drink normally.Denies CP, dyspnea, or back pain. She has been taking Hydrocodone with little to no relief.    Past Medical History:  Diagnosis Date  . Anemia   . Anxiety    SEVERE  . Arthritis    "probably in my back" (05/08/2016)  . Atrial fibrillation with RVR ([place]) 11/28/2012  . Chronic lower back pain   . Coronary artery disease   . Diverticulitis   . DVT (deep venous thrombosis) ([place])   . Emphysema of lung ([place])   . GERD (gastroesophageal reflux disease)   . High cholesterol   . Hyperglycemia   . Hyperlipidemia   . Hypertension   . Impaired mobility    ambulates with walker.  . Leukocytosis   . Multiple drug allergies   . Neuropathy ([place])   . On home oxygen therapy    "2L; 24/7 prn" (05/08/2016)  . PE (pulmonary embolism)    Bilateral PE, stringy saddle embolus with predominant proximal lower lobe pulmonary artery involvement 06/26/12  . Type II diabetes mellitus [facility])       Patient Active Problem List   Diagnosis Date Noted  . Sigmoid diverticulitis 06/10/2016  . Pancreatic mass 06/10/2016  . Cholelithiases 06/10/2016  . Diverticulitis 06/10/2016  . Gallstones   . Hypotension   . Chronic systolic CHF (congestive heart failure) ([place]) 05/30/2016  . COPD (chronic obstructive pulmonary disease) ([place]) 04/02/2016  . Chronic hypoxemic respiratory failure ([place]) 03/19/2016  . Hypothyroidism 09/14/2015  . Borderline low O2 saturation 05/24/2015  . Premature atrial contraction 10/21/2014  . Abnormal EKG 10/21/2014  . History of pulmonary embolism 10/21/2014  . Atrial flutter ([place]) 08/30/2014  . Right hip pain 08/01/2014  . Atypical chest pain 04/22/2014  . Vaginitis and vulvovaginitis 03/22/2014  . UTI symptoms 03/22/2014  . Knee pain 01/11/2014  . External hemorrhoid, bleeding 11/02/2013  . Encounter for therapeutic drug monitoring 10/15/2013  . Cataract of both eyes 08/03/2013  . Right groin pain 03/31/2013  . Insomnia 12/09/2012  . Atrial fibrillation with RVR ([place]) 11/28/2012  . Osteoarthritis of left knee 11/26/2012  . Leg pain 11/03/2012  . Left knee pain 10/31/2012  . Nohelia Valenza term (current) use of anticoagulants 07/16/2012  . PE (pulmonary embolism) 06/30/2012  . GERD (gastroesophageal reflux disease) 06/26/2012  . Anxiety 06/26/2012  . DJD (degenerative joint disease) 06/26/2012  . Palpitations 06/25/2012  . Chest pain 02/13/2012  . Fatigue 02/01/2012  . Panic attack 11/13/2011  . General medical examination 07/21/2011  . Osteoporosis 07/09/2011  . Right sided abdominal pain 03/26/2011  .  Plantar fasciitis 03/13/2011  . DYSURIA 10/24/2010  . RHINITIS 10/02/2010  . CARDIOMYOPATHY, DILATED 08/01/2010  . OBESITY 05/05/2010  . HIP PAIN 02/28/2010  . PLANTAR FASCIITIS, RIGHT 02/24/2010  . EDEMA- LOCALIZED 02/20/2010  . PORTAL VEIN THROMBOSIS 01/23/2010  . HEART FAILURE, CONGESTIVE UNSPEC 01/09/2010  . CANDIDIASIS OF MOUTH 01/02/2010  .  ANEMIA 01/02/2010  . Leukocytosis 01/02/2010  . THROMBOCYTOSIS 01/02/2010  . GERD 01/02/2010  . VAGINITIS, ATROPHIC 12/05/2009  . BACK PAIN, CHRONIC 11/08/2009  . Diabetes mellitus with neuropathy ([place]) 11/07/2009  . SMOKER 08/31/2009  . UTI 06/13/2009  . VAGINITIS 06/06/2009  . EUSTACHIAN TUBE DYSFUNCTION 03/28/2009  . HEART MURMUR, SYSTOLIC 0000000  . SINUSITIS- ACUTE-NOS 11/01/2008  . DIVERTICULITIS OF COLON 09/06/2008  . Hyperlipidemia associated with type 2 diabetes mellitus ([place]) 07/30/2008  . Anxiety and depression 07/30/2008  . Essential hypertension 07/30/2008    Past Surgical History:  Procedure Laterality Date  . CARDIAC CATHETERIZATION N/A 05/30/2016   Procedure: Left Heart Cath and Coronary Angiography;  Surgeon: Peter M Martinique, MD;  Location: [facility];  Service: Cardiovascular;  Laterality: N/A;  . CATARACT EXTRACTION W/ INTRAOCULAR LENS  IMPLANT, BILATERAL Bilateral 08/2013  . JOINT REPLACEMENT    . TONSILLECTOMY    . TOTAL KNEE ARTHROPLASTY Left 11/24/2012   Procedure: TOTAL KNEE ARTHROPLASTY;  Surgeon: Kerin Salen, MD;  Location: [facility];  Service: Orthopedics;  Laterality: Left;  . TYMPANOSTOMY TUBE PLACEMENT Right early 2000s    Current Outpatient Rx  . Order #: [id number] Class: Historical Med  . Order #: [id number] Class: Historical Med  . Order #: [id number] Class: Normal  . Order #: [id number] Class: Print  . Order #: [id number] Class: Historical Med  . Order #: [id number] Class: Historical Med  . Order #: [id number] Class: Print  . Order #: [id number] Class: Print  . Order #: [id number] Class: Normal  . Order #: [id number] Class: Normal  . Order #: [id number] Class: Historical Med  . Order #: [id number] Class: Normal  . Order #: [id number] Class: Normal  . Order #: [id number] Class: Normal  . Order #: [id number] Class: Print  . Order #: [id number] Class: Normal  . Order #: [id number] Class: Historical Med  . Order #: [id number] Class: Normal  . Order #: [id number] Class:  Historical Med  . Order #: [id number] Class: Historical Med  . Order #: [id number] Class: Historical Med  . Order #: [id number] Class: Normal  . Order #: [id number] Class: Print  . Order #: [id number] Class: Historical Med  . Order #: [id number] Class: Historical Med  . Order #: [id number] Class: Normal  . Order #: [id number] Class: Normal  . Order #: [id number] Class: Historical Med  . Order #: [id number] Class: Historical Med    Allergies Morphine and related; Cymbalta [duloxetine hcl]; Amoxicillin; Amoxicillin-pot clavulanate; Citalopram hydrobromide; Clonazepam; Macrobid [nitrofurantoin macrocrystal]; Penicillins; Pyridium [phenazopyridine hcl]; Sertraline hcl; Wellbutrin [bupropion]; Zonegran; and Zonisamide  Family History  Problem Relation Age of Onset  . Diabetes Mother   . Hypertension Mother   . Heart disease Father     MI age 32's   . Diabetes Brother   . Hypertension Brother     Social History Social History  Substance Use Topics  . Smoking status: Former Smoker    Packs/day: 0.50    Years: 60.00    Types: Cigarettes    Quit date: 03/18/2016  . Smokeless tobacco: Never Used  . Alcohol use No    Review of Systems  Constitutional: No fever/chills Eyes: No visual changes. ENT: No sore throat. Cardiovascular: Denies chest pain. Respiratory: Denies shortness of breath. Gastrointestinal: Positive epigastric abdominal pain.  No  nausea, no vomiting.  No diarrhea.  No constipation. Genitourinary: Negative for dysuria. Positive urinary retention.  Musculoskeletal: Negative for back pain. Skin: Negative for rash. Neurological: Negative for headaches, focal weakness or numbness.  10-point ROS otherwise negative.  ____________________________________________   PHYSICAL EXAM:  VITAL SIGNS: ED Triage Vitals  Enc Vitals Group     BP 06/15/16 1921 102/78     Pulse Rate 06/15/16 1921 108     Resp 06/15/16 1921 24     Temp 06/15/16 1921 97.6 F (36.4 C)     Temp Source 06/15/16  1921 Oral     SpO2 06/15/16 1921 97 %     Pain Score 06/15/16 1941 10   Constitutional: Alert and oriented. Generally well appearing but does not answer questions in complete sentences. Instead, patient tends to answer in repetitive fragments and appears intermittently agitated.  Eyes: Conjunctivae are normal. PERRL. EOMI. Head: Atraumatic. Nose: No congestion/rhinnorhea. Mouth/Throat: Mucous membranes are moist.  Oropharynx non-erythematous. Neck: No stridor.  Cardiovascular: Sinus tachycardia. Good peripheral circulation. Grossly normal heart sounds.   Respiratory: Normal respiratory effort.  No retractions. Lungs CTAB. Gastrointestinal: Soft and nontender. No distention.  Musculoskeletal: No lower extremity tenderness nor edema. No gross deformities of extremities. Neurologic:  Normal speech and language. No gross focal neurologic deficits are appreciated.  Skin:  Skin is warm, dry and intact. No rash noted. Psychiatric: Patient appears intermittently anxious. Otherwise normal exam.   ____________________________________________   LABS (all labs ordered are listed, but only abnormal results are displayed)  Labs Reviewed  COMPREHENSIVE METABOLIC PANEL - Abnormal; Notable for the following:       Result Value   Sodium 129 (*)    Chloride 92 (*)    Glucose, Bld 118 (*)    ALT 12 (*)    All other components within normal limits  CBC - Abnormal; Notable for the following:    Hemoglobin 10.8 (*)    HCT 33.3 (*)    RDW 16.2 (*)    Platelets 446 (*)    All other components within normal limits  URINALYSIS, ROUTINE W REFLEX MICROSCOPIC (NOT AT [facility]) - Abnormal; Notable for the following:    Specific Gravity, Urine 1.004 (*)    All other components within normal limits  LIPASE, BLOOD  URINALYSIS, ROUTINE W REFLEX MICROSCOPIC (NOT AT [facility])    ____________________________________________  RADIOLOGY  None ____________________________________________   PROCEDURES  Procedure(s) performed:   Procedures  None ____________________________________________   INITIAL IMPRESSION / ASSESSMENT AND PLAN / ED COURSE  Pertinent labs & imaging results that were available during my care of the patient were reviewed by me and considered in my medical decision making (see chart for details).  Patient resents to the emergency department for evaluation of epigastric discomfort and urinary retention. She appears anxious at times and has periods of repetitive answering but at other times gives detailed information. Family at bedside state that this is how the patient typically acts. Deny any confusion or other AMS at home or currently. Patient does have a mild hyponatremia. No fever. She continues to keep down her antibiotics and pain medication. Bedside bladder scan shows less than 300 mls of urine. The patient and family are insistent that we place a catheter to relieve the patient's symptoms. Have some lower abdominal discomfort but abdomen is completely unremarkable on exam. Plan for in and out catheterization along with IV fluids and pain medication for mild hyponatremia. Patient has a clear follow-up plan with gastroenterology to evaluate  her pancreatic mass.. No fever, chills, or other sign of systemic infectious process or surgical process in the abdomen.   12:14 AM In and out cath performed per patient request. Kidney function normal. She is not feeling significantly better after cath but abdomen remains soft and non-tender to palpation. No rebound or guarding. Very low suspicion for developing abscess or surgical process. Discussed my impression and plan with patient, husband, and family at bedside. She is at her mental status baseline despite low sodium. Offered additional CT scan but the patient and husband would like to return home.  Offered to change home pain medication but patient and husband would like to continue home Vicodin. Discussed return precautions in detail. She will follow with PCP for sodium re-check on Monday.   At this time, I do not feel there is any life-threatening condition present. I have reviewed and discussed all results (EKG, imaging, lab, urine as appropriate), exam findings with patient. I have reviewed nursing notes and appropriate previous records.  I feel the patient is safe to be discharged home without further emergent workup. Discussed usual and customary return precautions. Patient and family (if present) verbalize understanding and are comfortable with this plan.  Patient will follow-up with their primary care provider. If they do not have a primary care provider, information for follow-up has been provided to them. All questions have been answered.  ____________________________________________  FINAL CLINICAL IMPRESSION(S) / ED DIAGNOSES  Final diagnoses:  Epigastric pain     MEDICATIONS GIVEN DURING THIS VISIT:  Medications  fentaNYL (SUBLIMAZE) injection 50 mcg (50 mcg Intravenous Given 06/15/16 2328)  sodium chloride 0.9 % bolus 500 mL (0 mLs Intravenous Stopped 06/16/16 0036)     NEW OUTPATIENT MEDICATIONS STARTED DURING THIS VISIT:  None   Note:  This document was prepared using Dragon voice recognition software and may include unintentional dictation errors.  Nanda Quinton, MD Emergency Medicine   Margette Fast, MD 06/16/16 1131

## 2016-06-15 NOTE — Patient Outreach (Signed)
Transition of care call: Placed call to patient for transition of care. I reviewed reason for call and [facility] program.  Patient states that she could talk with me.   Patient reports " I am fine"   Very difficult to engage in conversation.   Reviewed medications and patient just answered yes or no.   Inquired if patient needing any assistance and she reports no. States that she does not have follow up appointment scheduled with MD.  Also reports labs will be drawn prior to procedure of biopsy on 10/19//2017.   Patient reports that she monitors her CBG at night and readings are always good. States last nights reading of 112.  Patient has agreed to phone call next week.  PLAN: Will outreach weekly. Will attempt home visit if patient will allow. Will send barrier letter to primary MD.  [facility] CM Care Plan Problem One   Flowsheet Row Most Recent Value  Care Plan Problem One  Recent admission for abdominal pain  Role Documenting the Problem One  Care Management [place] for Problem One  Active  [facility] Long Term Goal (31-90 days)  Patient will report no readmissions for abdominal pain in the next 31 days.  [facility] Long Term Goal Start Date  06/15/16  Interventions for Problem One Long Term Goal  Reviewed transition of care program. Reviewed discharge instructions.  [facility] CM Short Term Goal #1 (0-30 days)  Patient will report follow up with primary MD in 2 weeks.  [facility] CM Short Term Goal #1 Start Date  06/15/16  Interventions for Short Term Goal #1  reviewed importance of timely follow up with primary MD. Encouraged patient to call and make an appointment.  [facility] CM Short Term Goal #2 (0-30 days)  Patient will report labs drawn within the next 7 days.  [facility] CM Short Term Goal #2 Start Date  06/15/16  Interventions for Short Term Goal #2  Encouraged pateint to get labs completed per discharge orders.      Tomasa Rand, RN, BSN, CEN [facility] [employer] [phone]

## 2016-06-15 NOTE — Telephone Encounter (Signed)
Shanon Brow, the patient's husband called to ask me for directions as to what dose of warfarin she is to take after the procedure on 10-19. I told him he needs to call [facility] she goes to for the coumadin clinic.  He can speak to Meriam Sprague.  He said he spoke to her and she didn't give him directions.  I advised him to call her back.  He also asked what time she needs to take her medication the morning of the procedure. I told him we advise people to hold to  4 hours prior. He then said someone from the sedation department at hospital told her to take her meds, hypertension, thyroid, etc before 8:30 am.  I told him to do whatever they told him he should do.

## 2016-06-15 NOTE — Telephone Encounter (Signed)
See alternate note  

## 2016-06-15 NOTE — Telephone Encounter (Signed)
Patient's husband called to inform me that patient will be having a surgical procedure on Thursday, 10/19.  Husband said that the surgeon has given the patient instructions about coumadin and Lovenox pre and post surgery.  I scheduled an appointment to check INR on Wednesday, 10/25.

## 2016-06-16 LAB — URINALYSIS, ROUTINE W REFLEX MICROSCOPIC
Bilirubin Urine: NEGATIVE
GLUCOSE, UA: NEGATIVE mg/dL
Hgb urine dipstick: NEGATIVE
Ketones, ur: NEGATIVE mg/dL
LEUKOCYTES UA: NEGATIVE
NITRITE: NEGATIVE
PH: 6.5 (ref 5.0–8.0)
Protein, ur: NEGATIVE mg/dL
SPECIFIC GRAVITY, URINE: 1.005 (ref 1.005–1.030)

## 2016-06-16 NOTE — ED Notes (Signed)
Patient was asked if she was in pain and she kept stating "get this water out of me" Could not properly assess the pain levels.  Bladder scan will be done.  Will continue to monitor the patient closely

## 2016-06-16 NOTE — ED Notes (Signed)
Patient Alert and oriented X4. Stable and ambulatory. Patient verbalized understanding of the discharge instructions.  Patient belongings were taken by the patient.  

## 2016-06-16 NOTE — Discharge Instructions (Signed)

## 2016-06-16 NOTE — ED Notes (Signed)
This RN and Gennette Pac RN wasted 50 mcg of Fentanyl in the sharps container.

## 2016-06-17 ENCOUNTER — Other Ambulatory Visit: Payer: Self-pay | Admitting: Family Medicine

## 2016-06-18 ENCOUNTER — Telehealth: Payer: Self-pay | Admitting: Hematology

## 2016-06-18 ENCOUNTER — Telehealth: Payer: Self-pay | Admitting: Family Medicine

## 2016-06-18 ENCOUNTER — Encounter (HOSPITAL_COMMUNITY): Payer: Self-pay | Admitting: Emergency Medicine

## 2016-06-18 ENCOUNTER — Emergency Department (HOSPITAL_COMMUNITY): Payer: Medicare Other

## 2016-06-18 ENCOUNTER — Emergency Department (HOSPITAL_COMMUNITY)
Admission: EM | Admit: 2016-06-18 | Discharge: 2016-06-18 | Disposition: A | Discharge status: Home / Self Care | Payer: Medicare Other | Source: Home / Self Care | Attending: Emergency Medicine | Admitting: Emergency Medicine

## 2016-06-18 ENCOUNTER — Ambulatory Visit: Payer: Medicare Other

## 2016-06-18 DIAGNOSIS — C7889 Secondary malignant neoplasm of other digestive organs: Secondary | ICD-10-CM | POA: Diagnosis not present

## 2016-06-18 DIAGNOSIS — E1140 Type 2 diabetes mellitus with diabetic neuropathy, unspecified: Secondary | ICD-10-CM

## 2016-06-18 DIAGNOSIS — R933 Abnormal findings on diagnostic imaging of other parts of digestive tract: Secondary | ICD-10-CM | POA: Diagnosis not present

## 2016-06-18 DIAGNOSIS — J449 Chronic obstructive pulmonary disease, unspecified: Secondary | ICD-10-CM

## 2016-06-18 DIAGNOSIS — C259 Malignant neoplasm of pancreas, unspecified: Secondary | ICD-10-CM | POA: Diagnosis not present

## 2016-06-18 DIAGNOSIS — Z79899 Other long term (current) drug therapy: Secondary | ICD-10-CM | POA: Insufficient documentation

## 2016-06-18 DIAGNOSIS — I959 Hypotension, unspecified: Secondary | ICD-10-CM | POA: Diagnosis not present

## 2016-06-18 DIAGNOSIS — J438 Other emphysema: Secondary | ICD-10-CM | POA: Diagnosis not present

## 2016-06-18 DIAGNOSIS — I5022 Chronic systolic (congestive) heart failure: Secondary | ICD-10-CM

## 2016-06-18 DIAGNOSIS — D649 Anemia, unspecified: Secondary | ICD-10-CM | POA: Diagnosis not present

## 2016-06-18 DIAGNOSIS — R569 Unspecified convulsions: Secondary | ICD-10-CM | POA: Diagnosis not present

## 2016-06-18 DIAGNOSIS — Z7901 Long term (current) use of anticoagulants: Secondary | ICD-10-CM

## 2016-06-18 DIAGNOSIS — Z86711 Personal history of pulmonary embolism: Secondary | ICD-10-CM | POA: Diagnosis not present

## 2016-06-18 DIAGNOSIS — R404 Transient alteration of awareness: Secondary | ICD-10-CM | POA: Diagnosis not present

## 2016-06-18 DIAGNOSIS — E039 Hypothyroidism, unspecified: Secondary | ICD-10-CM | POA: Insufficient documentation

## 2016-06-18 DIAGNOSIS — Z9981 Dependence on supplemental oxygen: Secondary | ICD-10-CM | POA: Diagnosis not present

## 2016-06-18 DIAGNOSIS — R1084 Generalized abdominal pain: Secondary | ICD-10-CM | POA: Diagnosis not present

## 2016-06-18 DIAGNOSIS — I2510 Atherosclerotic heart disease of native coronary artery without angina pectoris: Secondary | ICD-10-CM | POA: Insufficient documentation

## 2016-06-18 DIAGNOSIS — E876 Hypokalemia: Secondary | ICD-10-CM

## 2016-06-18 DIAGNOSIS — G40909 Epilepsy, unspecified, not intractable, without status epilepticus: Secondary | ICD-10-CM | POA: Diagnosis not present

## 2016-06-18 DIAGNOSIS — Z87891 Personal history of nicotine dependence: Secondary | ICD-10-CM

## 2016-06-18 DIAGNOSIS — R52 Pain, unspecified: Secondary | ICD-10-CM | POA: Diagnosis not present

## 2016-06-18 DIAGNOSIS — Z96652 Presence of left artificial knee joint: Secondary | ICD-10-CM

## 2016-06-18 DIAGNOSIS — I110 Hypertensive heart disease with heart failure: Secondary | ICD-10-CM | POA: Insufficient documentation

## 2016-06-18 DIAGNOSIS — E785 Hyperlipidemia, unspecified: Secondary | ICD-10-CM | POA: Diagnosis not present

## 2016-06-18 DIAGNOSIS — M79671 Pain in right foot: Secondary | ICD-10-CM | POA: Insufficient documentation

## 2016-06-18 DIAGNOSIS — G9340 Encephalopathy, unspecified: Secondary | ICD-10-CM | POA: Diagnosis not present

## 2016-06-18 DIAGNOSIS — R4182 Altered mental status, unspecified: Secondary | ICD-10-CM | POA: Diagnosis not present

## 2016-06-18 DIAGNOSIS — R269 Unspecified abnormalities of gait and mobility: Secondary | ICD-10-CM | POA: Diagnosis not present

## 2016-06-18 DIAGNOSIS — I10 Essential (primary) hypertension: Secondary | ICD-10-CM | POA: Diagnosis not present

## 2016-06-18 DIAGNOSIS — E119 Type 2 diabetes mellitus without complications: Secondary | ICD-10-CM | POA: Diagnosis not present

## 2016-06-18 DIAGNOSIS — Z23 Encounter for immunization: Secondary | ICD-10-CM | POA: Diagnosis not present

## 2016-06-18 DIAGNOSIS — C7972 Secondary malignant neoplasm of left adrenal gland: Secondary | ICD-10-CM | POA: Diagnosis not present

## 2016-06-18 DIAGNOSIS — F418 Other specified anxiety disorders: Secondary | ICD-10-CM | POA: Diagnosis not present

## 2016-06-18 DIAGNOSIS — Z7984 Long term (current) use of oral hypoglycemic drugs: Secondary | ICD-10-CM

## 2016-06-18 DIAGNOSIS — C786 Secondary malignant neoplasm of retroperitoneum and peritoneum: Secondary | ICD-10-CM | POA: Diagnosis not present

## 2016-06-18 DIAGNOSIS — Z86718 Personal history of other venous thrombosis and embolism: Secondary | ICD-10-CM | POA: Diagnosis not present

## 2016-06-18 DIAGNOSIS — I480 Paroxysmal atrial fibrillation: Secondary | ICD-10-CM | POA: Diagnosis not present

## 2016-06-18 DIAGNOSIS — R930 Abnormal findings on diagnostic imaging of skull and head, not elsewhere classified: Secondary | ICD-10-CM | POA: Diagnosis not present

## 2016-06-18 DIAGNOSIS — K219 Gastro-esophageal reflux disease without esophagitis: Secondary | ICD-10-CM | POA: Diagnosis not present

## 2016-06-18 DIAGNOSIS — E871 Hypo-osmolality and hyponatremia: Secondary | ICD-10-CM | POA: Diagnosis not present

## 2016-06-18 LAB — I-STAT CHEM 8, ED
BUN: 5 mg/dL — ABNORMAL LOW (ref 6–20)
CALCIUM ION: 1.1 mmol/L — AB (ref 1.15–1.40)
CREATININE: 0.6 mg/dL (ref 0.44–1.00)
Chloride: 88 mmol/L — ABNORMAL LOW (ref 101–111)
GLUCOSE: 134 mg/dL — AB (ref 65–99)
HEMATOCRIT: 39 % (ref 36.0–46.0)
HEMOGLOBIN: 13.3 g/dL (ref 12.0–15.0)
Potassium: 3 mmol/L — ABNORMAL LOW (ref 3.5–5.1)
Sodium: 128 mmol/L — ABNORMAL LOW (ref 135–145)
TCO2: 22 mmol/L (ref 0–100)

## 2016-06-18 MED ORDER — POTASSIUM CHLORIDE CRYS ER 20 MEQ PO TBCR
40.0000 meq | EXTENDED_RELEASE_TABLET | Freq: Once | ORAL | Status: AC
  Administered 2016-06-18: 20 meq via ORAL
  Filled 2016-06-18: qty 2

## 2016-06-18 MED ORDER — POTASSIUM CHLORIDE 10 MEQ/100ML IV SOLN
10.0000 meq | Freq: Once | INTRAVENOUS | Status: AC
  Administered 2016-06-18: 10 meq via INTRAVENOUS
  Filled 2016-06-18: qty 100

## 2016-06-18 MED ORDER — POTASSIUM CHLORIDE CRYS ER 20 MEQ PO TBCR: 20.0000 meq | EXTENDED_RELEASE_TABLET | Freq: Every day | ORAL | 0 refills | Status: DC

## 2016-06-18 MED ORDER — OXYCODONE-ACETAMINOPHEN 5-325 MG PO TABS
2.0000 | ORAL_TABLET | Freq: Once | ORAL | Status: AC
  Administered 2016-06-18: 2 via ORAL
  Filled 2016-06-18: qty 2

## 2016-06-18 MED ORDER — SODIUM CHLORIDE 0.9 % IV BOLUS (SEPSIS)
1000.0000 mL | Freq: Once | INTRAVENOUS | Status: AC
  Administered 2016-06-18: 1000 mL via INTRAVENOUS

## 2016-06-18 NOTE — Telephone Encounter (Signed)
Family requested I call back later due to trying to get pt situated in the house.

## 2016-06-18 NOTE — ED Notes (Addendum)
Pt.'s husband at bedside and informed this Nurse that sometime last night , pt. Was up on her feet and twisted her ankle and started having pain , took Vicodin but no relief.  Pt.'s husband also stated that pt. Has problem urinating and "wants the fluid  out of her bladder too." pt. Continued to be anxious and cried at times.

## 2016-06-18 NOTE — ED Notes (Signed)
Patient transported to X-ray 

## 2016-06-18 NOTE — Telephone Encounter (Signed)
Returned call to husband and patient (on speaker phone). Per husband, patient was taken to [facility] Emergency Department today for leg pain and inability to void. He states patient was given a bag of IV fluids and had a urinary in/out catheter. Patient was discharged home, taking 3 staff members to get her in the car. Once home, they called the fire department to assist with getting patient in the home. Husband and patient would like to know what next steps should be taken. Per Dr. Birdie Riddle, advised patient if she is unable to void by herself in the next 2-4 hours, she is to call EMS for transport to an emergency room for evaluation. Patient states "I am not going anywhere tonight, if I wait long enough, some pee will come out". Patient again encouraged to go to emergency room if unable to void within the next 2-4 hours. Patient desires to see Dr. Birdie Riddle, advised patient we would not be unable to lift her out of the car to get her inside the clinic,therefore needed to be seen in E.D. Patient verbalized understanding, but plans to stay home for the rest of this evening.

## 2016-06-18 NOTE — Telephone Encounter (Signed)
Pt decline appt at this time due to having other health issues.  Requested to call back on 10/20.

## 2016-06-18 NOTE — ED Notes (Signed)
Attempt to draw lab to right hand, unsuccessful, phlebotomy at bedside for second attempt.

## 2016-06-18 NOTE — Telephone Encounter (Signed)
Noted.  Agree w/ advice given.  If pt can not urinate after 4 hrs, she should seek medical care as this becomes a potentially dangerous situation.  And unfortunately, we cannot see her until she is ambulatory or has some form of transport into the office b/c we are not able to lift patients.

## 2016-06-18 NOTE — ED Triage Notes (Signed)
Per EMS pt. From home with  initial complaint of chronic foot pain at 10/10 which not relieve by home meds,vicodin. Pt. Was found having anxiety attack and also complaint of SOB, pt. Has been using O2  at home, negative for wheezes upon EMS assessment. Alert and oriented x4. Pt. Has hx of neuropathy bil. LE.

## 2016-06-18 NOTE — ED Notes (Signed)
Pt refuses foley catheter. Pt and husband request ONLY in and out catheter to empty bladder. Zammit aware and verbalizes ONLY in and out catheter and plan to discharge.

## 2016-06-18 NOTE — ED Notes (Signed)
Ortho tech at bedside 

## 2016-06-18 NOTE — ED Notes (Signed)
Sarah Mcguire verbalizes do not need urinalysis.

## 2016-06-18 NOTE — ED Notes (Signed)
IV team at bedside 

## 2016-06-18 NOTE — Discharge Instructions (Signed)
Follow up with your md this week as planned.   Take hydrocone for pain

## 2016-06-18 NOTE — ED Notes (Signed)
Attempt IV to LEFT AC unsuccessful. IV consult placed.

## 2016-06-18 NOTE — Telephone Encounter (Signed)
Husband states that pt went to ER and now states that she is not able to walk due to the pain in her legs. Also pt is retaining fluid and isn't going to bathroom. Husband is asking what should he do.

## 2016-06-18 NOTE — ED Provider Notes (Signed)
[facility] DEPT Provider Note   CSN: [id number] Arrival date & time: 06/18/16  [id number]     History   Chief Complaint Chief Complaint  Patient presents with  . Panic Attack  . Shortness of Breath  . Foot Pain    chronic     HPI Gizell B Manzella is a 75 y.o. female.  Pt complains of diarrhea and right foot pain.   Also some urinary retention   The history is provided by the patient. No language interpreter was used.  Diarrhea   This is a new problem. The current episode started more than 2 days ago. The problem has not changed since onset.The stool consistency is described as watery. There has been no fever. Pertinent negatives include no abdominal pain, no headaches and no cough. She has tried nothing for the symptoms.    Past Medical History:  Diagnosis Date  . Anemia   . Anxiety    SEVERE  . Arthritis    "probably in my back" (05/08/2016)  . Atrial fibrillation with RVR ([place]) 11/28/2012  . Chronic lower back pain   . Coronary artery disease   . Diverticulitis   . DVT (deep venous thrombosis) ([place])   . Emphysema of lung ([place])   . GERD (gastroesophageal reflux disease)   . High cholesterol   . Hyperglycemia   . Hyperlipidemia   . Hypertension   . Impaired mobility    ambulates with walker.  . Leukocytosis   . Multiple drug allergies   . Neuropathy ([place])   . On home oxygen therapy    "2L; 24/7 prn" (05/08/2016)  . PE (pulmonary embolism)    Bilateral PE, stringy saddle embolus with predominant proximal lower lobe pulmonary artery involvement 06/26/12  . Type II diabetes mellitus [facility])     Patient Active Problem List   Diagnosis Date Noted  . Sigmoid diverticulitis 06/10/2016  . Pancreatic mass 06/10/2016  . Cholelithiases 06/10/2016  . Diverticulitis 06/10/2016  . Gallstones   . Hypotension   . Chronic systolic CHF (congestive heart failure) ([place]) 05/30/2016  . COPD (chronic obstructive pulmonary disease) ([place]) 04/02/2016  . Chronic hypoxemic respiratory  failure ([place]) 03/19/2016  . Hypothyroidism 09/14/2015  . Borderline low O2 saturation 05/24/2015  . Premature atrial contraction 10/21/2014  . Abnormal EKG 10/21/2014  . History of pulmonary embolism 10/21/2014  . Atrial flutter ([place]) 08/30/2014  . Right hip pain 08/01/2014  . Atypical chest pain 04/22/2014  . Vaginitis and vulvovaginitis 03/22/2014  . UTI symptoms 03/22/2014  . Knee pain 01/11/2014  . External hemorrhoid, bleeding 11/02/2013  . Encounter for therapeutic drug monitoring 10/15/2013  . Cataract of both eyes 08/03/2013  . Right groin pain 03/31/2013  . Insomnia 12/09/2012  . Atrial fibrillation with RVR ([place]) 11/28/2012  . Osteoarthritis of left knee 11/26/2012  . Leg pain 11/03/2012  . Left knee pain 10/31/2012  . Long term (current) use of anticoagulants 07/16/2012  . PE (pulmonary embolism) 06/30/2012  . GERD (gastroesophageal reflux disease) 06/26/2012  . Anxiety 06/26/2012  . DJD (degenerative joint disease) 06/26/2012  . Palpitations 06/25/2012  . Chest pain 02/13/2012  . Fatigue 02/01/2012  . Panic attack 11/13/2011  . General medical examination 07/21/2011  . Osteoporosis 07/09/2011  . Right sided abdominal pain 03/26/2011  . Plantar fasciitis 03/13/2011  . DYSURIA 10/24/2010  . RHINITIS 10/02/2010  . CARDIOMYOPATHY, DILATED 08/01/2010  . OBESITY 05/05/2010  . HIP PAIN 02/28/2010  . PLANTAR FASCIITIS, RIGHT 02/24/2010  . EDEMA- LOCALIZED 02/20/2010  .  PORTAL VEIN THROMBOSIS 01/23/2010  . HEART FAILURE, CONGESTIVE UNSPEC 01/09/2010  . CANDIDIASIS OF MOUTH 01/02/2010  . ANEMIA 01/02/2010  . Leukocytosis 01/02/2010  . THROMBOCYTOSIS 01/02/2010  . GERD 01/02/2010  . VAGINITIS, ATROPHIC 12/05/2009  . BACK PAIN, CHRONIC 11/08/2009  . Diabetes mellitus with neuropathy ([place]) 11/07/2009  . SMOKER 08/31/2009  . UTI 06/13/2009  . VAGINITIS 06/06/2009  . EUSTACHIAN TUBE DYSFUNCTION 03/28/2009  . HEART MURMUR, SYSTOLIC 0000000  . SINUSITIS-  ACUTE-NOS 11/01/2008  . DIVERTICULITIS OF COLON 09/06/2008  . Hyperlipidemia associated with type 2 diabetes mellitus ([place]) 07/30/2008  . Anxiety and depression 07/30/2008  . Essential hypertension 07/30/2008    Past Surgical History:  Procedure Laterality Date  . CARDIAC CATHETERIZATION N/A 05/30/2016   Procedure: Left Heart Cath and Coronary Angiography;  Surgeon: Peter M Martinique, MD;  Location: [facility];  Service: Cardiovascular;  Laterality: N/A;  . CATARACT EXTRACTION W/ INTRAOCULAR LENS  IMPLANT, BILATERAL Bilateral 08/2013  . JOINT REPLACEMENT    . TONSILLECTOMY    . TOTAL KNEE ARTHROPLASTY Left 11/24/2012   Procedure: TOTAL KNEE ARTHROPLASTY;  Surgeon: Kerin Salen, MD;  Location: [facility];  Service: Orthopedics;  Laterality: Left;  . TYMPANOSTOMY TUBE PLACEMENT Right early 2000s    OB History    Gravida Para Term Preterm AB Living   2 2 2     2    SAB TAB Ectopic Multiple Live Births                   Home Medications    Prior to Admission medications   Medication Sig Start Date End Date Taking? Authorizing Provider  acetaminophen (TYLENOL) 325 MG tablet Take 325-650 mg by mouth every 6 (six) hours as needed (for headache).    Yes Historical Provider, MD  albuterol (PROVENTIL HFA;VENTOLIN HFA) 108 (90 Base) MCG/ACT inhaler Inhale 2 puffs into the lungs every 6 (six) hours as needed for wheezing or shortness of breath. 04/17/16  Yes Tammy S Parrett, NP  alprazolam (XANAX) 2 MG tablet Take 1 tablet (2 mg total) by mouth 3 (three) times daily. Patient taking differently: Take 1 mg by mouth 6 (six) times daily.  06/13/16  Yes Theodis Blaze, MD  calcium carbonate (TUMS EX) 750 MG chewable tablet Chew 1 tablet by mouth as needed for heartburn.   Yes Historical Provider, MD  Cholecalciferol (VITAMIN D) 2000 UNITS tablet Take 2,000 Units by mouth daily.   Yes Historical Provider, MD  ciprofloxacin (CIPRO) 500 MG tablet Take 1 tablet (500 mg total) by mouth 2 (two) times  daily. Patient taking differently: Take 500 mg by mouth 2 (two) times daily. Started 10/11 for 10 days 06/13/16  Yes Theodis Blaze, MD  clobetasol cream (TEMOVATE) [id number] % Apply 1 application topically 2 (two) times daily as needed (for yeast infection).   Yes Historical Provider, MD  enoxaparin (LOVENOX) 80 MG/0.8ML injection Inject 0.8 mLs (80 mg total) into the skin every 12 (twelve) hours. Stop taking after the dose on October 17th, 2017 06/13/16  Yes Iskra Barbera Setters, MD  esomeprazole (NEXIUM) 40 MG capsule Take 1 capsule (40 mg total) by mouth daily as needed. For acid reflux. 05/24/15  Yes Midge Minium, MD  fenofibrate 160 MG tablet Take 1 tablet (160 mg total) by mouth daily. 03/20/16  Yes Midge Minium, MD  Fluticasone Furoate-Vilanterol (BREO ELLIPTA IN) Inhale 1 puff into the lungs daily.   Yes Historical Provider, MD  folic acid (FOLVITE) 1  MG tablet Take 1 tablet (1 mg total) by mouth daily. 05/31/14  Yes Midge Minium, MD  furosemide (LASIX) 40 MG tablet TAKE 1 TABLET BY MOUTH EVERY DAY Patient taking differently: Take 20 mg by mouth every day 04/23/16  Yes Midge Minium, MD  glucose blood (ONE TOUCH ULTRA TEST) test strip TEST TWICE A DAY AS DIRECTED 12/19/15  Yes Midge Minium, MD  HYDROcodone-acetaminophen (NORCO/VICODIN) 5-325 MG tablet Take 1-2 tablets by mouth every 4 (four) hours as needed. Patient taking differently: Take 1-2 tablets by mouth every 4 (four) hours as needed for moderate pain or severe pain.  06/13/16  Yes Theodis Blaze, MD  lidocaine (LIDODERM) 5 % Place 1 patch onto the skin daily as needed (for pain.). Remove & Discard patch within 12 hours or as directed by MD   Yes Historical Provider, MD  lisinopril (PRINIVIL,ZESTRIL) 10 MG tablet TAKE 1 TABLET BY MOUTH EVERY DAY Patient taking differently: Take 10 mg by mouth every day 05/28/16  Yes Midge Minium, MD  metFORMIN (GLUCOPHAGE-XR) 500 MG 24 hr tablet Take 500-1,000 mg by mouth See admin  instructions. Pt takes 1000mg  in morning after breakfast, 500mg  in evening after dinner   Yes Historical Provider, MD  metoprolol succinate (TOPROL XL) 25 MG 24 hr tablet Take 1 tablet (25 mg total) by mouth daily. 05/22/16  Yes Eileen Stanford, PA-C  metroNIDAZOLE (FLAGYL) 500 MG tablet Take 1 tablet (500 mg total) by mouth every 8 (eight) hours. Patient taking differently: Take 500 mg by mouth every 8 (eight) hours. Started 10/11 for 10 days 06/13/16  Yes Theodis Blaze, MD  Multiple Vitamin (MULTIVITAMIN) tablet Take 1 tablet by mouth daily.     Yes Historical Provider, MD  OXYGEN Inhale 2 L into the lungs as needed (during activity and while sleeping).   Yes Historical Provider, MD  sertraline (ZOLOFT) 25 MG tablet Take 1 tablet (25 mg total) by mouth at bedtime. 06/04/16  Yes Midge Minium, MD  simvastatin (ZOCOR) 20 MG tablet TAKE 1 TABLET BY MOUTH EVERY EVENING Patient taking differently: Take 20 mg by mouth every evening 05/28/16  Yes Midge Minium, MD  terconazole (TERAZOL 7) 0.4 % vaginal cream Place 1 applicator vaginally at bedtime as needed (itching).   Yes Historical Provider, MD  warfarin (COUMADIN) 5 MG tablet Take 2.5-5 mg by mouth See admin instructions. Takes 5mg  Tuesday, Thursday, and Sunday, then takes 2.5mg  on Monday, Friday, and Saturday. Nothing on Wednesday 05/28/16  Yes Historical Provider, MD  levothyroxine (SYNTHROID, LEVOTHROID) 50 MCG tablet TAKE 1 TABLET (50 MCG TOTAL) BY MOUTH DAILY. 06/18/16   Midge Minium, MD    Family History Family History  Problem Relation Age of Onset  . Diabetes Mother   . Hypertension Mother   . Heart disease Father     MI age 51's   . Diabetes Brother   . Hypertension Brother     Social History Social History  Substance Use Topics  . Smoking status: Former Smoker    Packs/day: 0.50    Years: 60.00    Types: Cigarettes    Quit date: 03/18/2016  . Smokeless tobacco: Never Used  . Alcohol use No     Allergies     Morphine and related; Cymbalta [duloxetine hcl]; Amoxicillin; Amoxicillin-pot clavulanate; Citalopram hydrobromide; Clonazepam; Macrobid [nitrofurantoin macrocrystal]; Penicillins; Pyridium [phenazopyridine hcl]; Sertraline hcl; Wellbutrin [bupropion]; Zonegran; and Zonisamide   Review of Systems Review of Systems  Constitutional: Negative for  appetite change and fatigue.  HENT: Negative for congestion, ear discharge and sinus pressure.   Eyes: Negative for discharge.  Respiratory: Negative for cough.   Cardiovascular: Negative for chest pain.  Gastrointestinal: Positive for diarrhea. Negative for abdominal pain.  Genitourinary: Negative for frequency and hematuria.  Musculoskeletal: Negative for back pain.  Skin: Negative for rash.  Neurological: Negative for seizures and headaches.  Psychiatric/Behavioral: Negative for hallucinations.     Physical Exam Updated Vital Signs BP 137/96   Pulse 115   Resp 16   LMP  (LMP Unknown)   SpO2 95%   Physical Exam  Constitutional: She is oriented to person, place, and time. She appears well-developed.  HENT:  Head: Normocephalic.  Eyes: Conjunctivae and EOM are normal. No scleral icterus.  Neck: Neck supple. No thyromegaly present.  Cardiovascular: Normal rate and regular rhythm.  Exam reveals no gallop and no friction rub.   No murmur heard. Pulmonary/Chest: No stridor. She has no wheezes. She has no rales. She exhibits no tenderness.  Abdominal: She exhibits no distension. There is no tenderness. There is no rebound.  Musculoskeletal: Normal range of motion. She exhibits no edema.  Tenderness right foot.  Neuro vasc nl  Lymphadenopathy:    She has no cervical adenopathy.  Neurological: She is oriented to person, place, and time. She exhibits normal muscle tone. Coordination normal.  Skin: No rash noted. No erythema.  Psychiatric: She has a normal mood and affect. Her behavior is normal.     ED Treatments / Results  Labs (all  labs ordered are listed, but only abnormal results are displayed) Labs Reviewed  I-STAT CHEM 8, ED - Abnormal; Notable for the following:       Result Value   Sodium 128 (*)    Potassium 3.0 (*)    Chloride 88 (*)    BUN 5 (*)    Glucose, Bld 134 (*)    Calcium, Ion 1.10 (*)    All other components within normal limits    EKG  EKG Interpretation None       Radiology Dg Abd Acute W/chest  Result Date: 06/18/2016 CLINICAL DATA:  75 year old female with confusion, generalized abdominal pain. Diarrhea. Sigmoid diverticulitis and pancreatic mass on recent CT. Initial encounter. EXAM: DG ABDOMEN ACUTE W/ 1V CHEST COMPARISON:  CT Abdomen and Pelvis 06/11/2016 and earlier. FINDINGS: Semi upright AP view of the chest. Stable lung volumes. Stable cardiac size and mediastinal contours. Calcified aortic atherosclerosis. Visualized tracheal air column is within normal limits. No pneumothorax, pulmonary edema or confluent pulmonary opacity. Left-side-down lateral decubitus view of the abdomen. No pneumoperitoneum. Supine views of the abdomen demonstrate a nonobstructed bowel-gas pattern similar to that on the prior CT Abdomen and Pelvis. The urinary bladder appears distended as before. Aortoiliac calcified atherosclerosis noted. No acute osseous abnormality identified. IMPRESSION: 1.  Normal bowel gas pattern, no free air. 2.  No acute cardiopulmonary abnormality. 3.  Calcified aortic atherosclerosis. Electronically Signed   By: Genevie Ann M.D.   On: 06/18/2016 08:49   Dg Foot Complete Right  Result Date: 06/18/2016 CLINICAL DATA:  Right foot pain, no known trauma EXAM: RIGHT FOOT COMPLETE - 3+ VIEW COMPARISON:  None. FINDINGS: Three views of the right foot submitted. There is diffuse osteopenia. Small plantar spur of calcaneus. No acute fracture or subluxation. IMPRESSION: No acute fracture or subluxation. Diffuse osteopenia. Small plantar spur of calcaneus. Electronically Signed   By: Lahoma Crocker M.D.    On: 06/18/2016 08:40  Procedures Procedures (including critical care time)  Medications Ordered in ED Medications  sodium chloride 0.9 % bolus 1,000 mL (0 mLs Intravenous Stopped 06/18/16 1012)  potassium chloride SA (K-DUR,KLOR-CON) CR tablet 40 mEq (20 mEq Oral Given 06/18/16 0901)  potassium chloride 10 mEq in 100 mL IVPB (0 mEq Intravenous Stopped 06/18/16 1012)  oxyCODONE-acetaminophen (PERCOCET/ROXICET) 5-325 MG per tablet 2 tablet (2 tablets Oral Given 06/18/16 1010)     Initial Impression / Assessment and Plan / ED Course  I have reviewed the triage vital signs and the nursing notes.  Pertinent labs & imaging results that were available during my care of the patient were reviewed by me and considered in my medical decision making (see chart for details).  Clinical Course  pt With hypokalemia and inflamed right foot. She will be treated with a cam walker pain medicine as needed given a prescription for potassium and she is answering service to be followed up on Thursday    Final Clinical Impressions(s) / ED Diagnoses   Final diagnoses:  Hypokalemia  Foot pain, right    New Prescriptions New Prescriptions   No medications on file     Milton Ferguson, MD 06/18/16 1054

## 2016-06-19 ENCOUNTER — Encounter (HOSPITAL_COMMUNITY): Payer: Self-pay

## 2016-06-19 ENCOUNTER — Other Ambulatory Visit: Payer: Self-pay

## 2016-06-19 ENCOUNTER — Telehealth: Payer: Self-pay | Admitting: Gastroenterology

## 2016-06-19 ENCOUNTER — Inpatient Hospital Stay (HOSPITAL_COMMUNITY)
Admission: EM | Admit: 2016-06-19 | Discharge: 2016-06-23 | DRG: 101 | Disposition: A | Discharge status: Home Health | Payer: Medicare Other | Attending: Internal Medicine | Admitting: Internal Medicine

## 2016-06-19 ENCOUNTER — Emergency Department (HOSPITAL_COMMUNITY): Payer: Medicare Other

## 2016-06-19 DIAGNOSIS — F410 Panic disorder [episodic paroxysmal anxiety] without agoraphobia: Secondary | ICD-10-CM | POA: Diagnosis present

## 2016-06-19 DIAGNOSIS — Z7951 Long term (current) use of inhaled steroids: Secondary | ICD-10-CM

## 2016-06-19 DIAGNOSIS — E876 Hypokalemia: Secondary | ICD-10-CM | POA: Diagnosis present

## 2016-06-19 DIAGNOSIS — K8689 Other specified diseases of pancreas: Secondary | ICD-10-CM | POA: Diagnosis present

## 2016-06-19 DIAGNOSIS — E785 Hyperlipidemia, unspecified: Secondary | ICD-10-CM | POA: Diagnosis present

## 2016-06-19 DIAGNOSIS — Z79899 Other long term (current) drug therapy: Secondary | ICD-10-CM

## 2016-06-19 DIAGNOSIS — Z9841 Cataract extraction status, right eye: Secondary | ICD-10-CM

## 2016-06-19 DIAGNOSIS — D649 Anemia, unspecified: Secondary | ICD-10-CM | POA: Diagnosis present

## 2016-06-19 DIAGNOSIS — R569 Unspecified convulsions: Secondary | ICD-10-CM

## 2016-06-19 DIAGNOSIS — Z86711 Personal history of pulmonary embolism: Secondary | ICD-10-CM

## 2016-06-19 DIAGNOSIS — Z9981 Dependence on supplemental oxygen: Secondary | ICD-10-CM

## 2016-06-19 DIAGNOSIS — I639 Cerebral infarction, unspecified: Secondary | ICD-10-CM

## 2016-06-19 DIAGNOSIS — I5022 Chronic systolic (congestive) heart failure: Secondary | ICD-10-CM | POA: Diagnosis present

## 2016-06-19 DIAGNOSIS — Z8249 Family history of ischemic heart disease and other diseases of the circulatory system: Secondary | ICD-10-CM

## 2016-06-19 DIAGNOSIS — Z87891 Personal history of nicotine dependence: Secondary | ICD-10-CM

## 2016-06-19 DIAGNOSIS — I959 Hypotension, unspecified: Secondary | ICD-10-CM | POA: Diagnosis present

## 2016-06-19 DIAGNOSIS — Z9842 Cataract extraction status, left eye: Secondary | ICD-10-CM

## 2016-06-19 DIAGNOSIS — Z7901 Long term (current) use of anticoagulants: Secondary | ICD-10-CM

## 2016-06-19 DIAGNOSIS — C259 Malignant neoplasm of pancreas, unspecified: Secondary | ICD-10-CM | POA: Diagnosis present

## 2016-06-19 DIAGNOSIS — R4182 Altered mental status, unspecified: Secondary | ICD-10-CM | POA: Diagnosis not present

## 2016-06-19 DIAGNOSIS — F419 Anxiety disorder, unspecified: Secondary | ICD-10-CM

## 2016-06-19 DIAGNOSIS — Z7982 Long term (current) use of aspirin: Secondary | ICD-10-CM

## 2016-06-19 DIAGNOSIS — G40909 Epilepsy, unspecified, not intractable, without status epilepticus: Principal | ICD-10-CM | POA: Diagnosis present

## 2016-06-19 DIAGNOSIS — Z961 Presence of intraocular lens: Secondary | ICD-10-CM | POA: Diagnosis present

## 2016-06-19 DIAGNOSIS — C7972 Secondary malignant neoplasm of left adrenal gland: Secondary | ICD-10-CM | POA: Diagnosis present

## 2016-06-19 DIAGNOSIS — C786 Secondary malignant neoplasm of retroperitoneum and peritoneum: Secondary | ICD-10-CM | POA: Diagnosis present

## 2016-06-19 DIAGNOSIS — F32A Depression, unspecified: Secondary | ICD-10-CM | POA: Diagnosis present

## 2016-06-19 DIAGNOSIS — Z833 Family history of diabetes mellitus: Secondary | ICD-10-CM

## 2016-06-19 DIAGNOSIS — E1140 Type 2 diabetes mellitus with diabetic neuropathy, unspecified: Secondary | ICD-10-CM | POA: Diagnosis present

## 2016-06-19 DIAGNOSIS — R9401 Abnormal electroencephalogram [EEG]: Secondary | ICD-10-CM | POA: Diagnosis present

## 2016-06-19 DIAGNOSIS — I4891 Unspecified atrial fibrillation: Secondary | ICD-10-CM | POA: Diagnosis present

## 2016-06-19 DIAGNOSIS — E039 Hypothyroidism, unspecified: Secondary | ICD-10-CM | POA: Diagnosis present

## 2016-06-19 DIAGNOSIS — R262 Difficulty in walking, not elsewhere classified: Secondary | ICD-10-CM

## 2016-06-19 DIAGNOSIS — K219 Gastro-esophageal reflux disease without esophagitis: Secondary | ICD-10-CM | POA: Diagnosis present

## 2016-06-19 DIAGNOSIS — F329 Major depressive disorder, single episode, unspecified: Secondary | ICD-10-CM | POA: Diagnosis present

## 2016-06-19 DIAGNOSIS — M79673 Pain in unspecified foot: Secondary | ICD-10-CM | POA: Diagnosis present

## 2016-06-19 DIAGNOSIS — Z7984 Long term (current) use of oral hypoglycemic drugs: Secondary | ICD-10-CM

## 2016-06-19 DIAGNOSIS — E871 Hypo-osmolality and hyponatremia: Secondary | ICD-10-CM | POA: Diagnosis present

## 2016-06-19 DIAGNOSIS — Z96652 Presence of left artificial knee joint: Secondary | ICD-10-CM | POA: Diagnosis present

## 2016-06-19 DIAGNOSIS — K5792 Diverticulitis of intestine, part unspecified, without perforation or abscess without bleeding: Secondary | ICD-10-CM | POA: Diagnosis present

## 2016-06-19 DIAGNOSIS — K5900 Constipation, unspecified: Secondary | ICD-10-CM | POA: Diagnosis not present

## 2016-06-19 DIAGNOSIS — I110 Hypertensive heart disease with heart failure: Secondary | ICD-10-CM | POA: Diagnosis present

## 2016-06-19 DIAGNOSIS — I10 Essential (primary) hypertension: Secondary | ICD-10-CM | POA: Diagnosis present

## 2016-06-19 DIAGNOSIS — M6281 Muscle weakness (generalized): Secondary | ICD-10-CM

## 2016-06-19 DIAGNOSIS — Z23 Encounter for immunization: Secondary | ICD-10-CM

## 2016-06-19 DIAGNOSIS — Z86718 Personal history of other venous thrombosis and embolism: Secondary | ICD-10-CM

## 2016-06-19 DIAGNOSIS — C7889 Secondary malignant neoplasm of other digestive organs: Secondary | ICD-10-CM | POA: Diagnosis present

## 2016-06-19 DIAGNOSIS — I2510 Atherosclerotic heart disease of native coronary artery without angina pectoris: Secondary | ICD-10-CM | POA: Diagnosis present

## 2016-06-19 DIAGNOSIS — R269 Unspecified abnormalities of gait and mobility: Secondary | ICD-10-CM | POA: Diagnosis present

## 2016-06-19 DIAGNOSIS — J449 Chronic obstructive pulmonary disease, unspecified: Secondary | ICD-10-CM | POA: Diagnosis present

## 2016-06-19 LAB — COMPREHENSIVE METABOLIC PANEL
ALBUMIN: 3.9 g/dL (ref 3.5–5.0)
ALT: 31 U/L (ref 14–54)
ANION GAP: 19 — AB (ref 5–15)
AST: 56 U/L — ABNORMAL HIGH (ref 15–41)
Alkaline Phosphatase: 46 U/L (ref 38–126)
BUN: 6 mg/dL (ref 6–20)
CALCIUM: 9.9 mg/dL (ref 8.9–10.3)
CHLORIDE: 88 mmol/L — AB (ref 101–111)
CO2: 22 mmol/L (ref 22–32)
Creatinine, Ser: 0.93 mg/dL (ref 0.44–1.00)
GFR calc non Af Amer: 58 mL/min — ABNORMAL LOW (ref >60)
GLUCOSE: 206 mg/dL — AB (ref 65–99)
POTASSIUM: 3 mmol/L — AB (ref 3.5–5.1)
SODIUM: 129 mmol/L — AB (ref 135–145)
Total Bilirubin: 0.6 mg/dL (ref 0.3–1.2)
Total Protein: 6.7 g/dL (ref 6.5–8.1)

## 2016-06-19 LAB — DIFFERENTIAL
BASOS PCT: 0 %
Basophils Absolute: 0 10*3/uL (ref 0.0–0.1)
EOS ABS: 0.1 10*3/uL (ref 0.0–0.7)
EOS PCT: 1 %
LYMPHS PCT: 16 %
Lymphs Abs: 1.6 10*3/uL (ref 0.7–4.0)
MONO ABS: 0.9 10*3/uL (ref 0.1–1.0)
Monocytes Relative: 9 %
NEUTROS PCT: 74 %
Neutro Abs: 7.7 10*3/uL (ref 1.7–7.7)

## 2016-06-19 LAB — CBC
HCT: 33.9 % — ABNORMAL LOW (ref 36.0–46.0)
Hemoglobin: 10.9 g/dL — ABNORMAL LOW (ref 12.0–15.0)
MCH: 27.1 pg (ref 26.0–34.0)
MCHC: 32.2 g/dL (ref 30.0–36.0)
MCV: 84.3 fL (ref 78.0–100.0)
PLATELETS: 483 10*3/uL — AB (ref 150–400)
RBC: 4.02 MIL/uL (ref 3.87–5.11)
RDW: 17.3 % — AB (ref 11.5–15.5)
WBC: 10.3 10*3/uL (ref 4.0–10.5)

## 2016-06-19 LAB — RAPID URINE DRUG SCREEN, HOSP PERFORMED
AMPHETAMINES: NOT DETECTED
Barbiturates: NOT DETECTED
Benzodiazepines: POSITIVE — AB
COCAINE: NOT DETECTED
OPIATES: POSITIVE — AB
TETRAHYDROCANNABINOL: NOT DETECTED

## 2016-06-19 LAB — I-STAT CHEM 8, ED
BUN: 6 mg/dL (ref 6–20)
Calcium, Ion: 1.21 mmol/L (ref 1.15–1.40)
Chloride: 88 mmol/L — ABNORMAL LOW (ref 101–111)
Creatinine, Ser: 0.7 mg/dL (ref 0.44–1.00)
Glucose, Bld: 186 mg/dL — ABNORMAL HIGH (ref 65–99)
HEMATOCRIT: 39 % (ref 36.0–46.0)
Hemoglobin: 13.3 g/dL (ref 12.0–15.0)
Potassium: 3 mmol/L — ABNORMAL LOW (ref 3.5–5.1)
SODIUM: 128 mmol/L — AB (ref 135–145)
TCO2: 23 mmol/L (ref 0–100)

## 2016-06-19 LAB — ETHANOL

## 2016-06-19 LAB — I-STAT TROPONIN, ED: Troponin i, poc: 0.01 ng/mL (ref 0.00–0.08)

## 2016-06-19 LAB — URINALYSIS, ROUTINE W REFLEX MICROSCOPIC
BILIRUBIN URINE: NEGATIVE
GLUCOSE, UA: NEGATIVE mg/dL
Hgb urine dipstick: NEGATIVE
KETONES UR: NEGATIVE mg/dL
LEUKOCYTES UA: NEGATIVE
NITRITE: NEGATIVE
PH: 7 (ref 5.0–8.0)
Protein, ur: NEGATIVE mg/dL
SPECIFIC GRAVITY, URINE: 1.007 (ref 1.005–1.030)

## 2016-06-19 LAB — PROTIME-INR
INR: 1.21
PROTHROMBIN TIME: 15.4 s — AB (ref 11.4–15.2)

## 2016-06-19 LAB — APTT: aPTT: 35 seconds (ref 24–36)

## 2016-06-19 LAB — CBG MONITORING, ED: GLUCOSE-CAPILLARY: 175 mg/dL — AB (ref 65–99)

## 2016-06-19 MED ORDER — SODIUM CHLORIDE 0.9 % IV BOLUS (SEPSIS)
500.0000 mL | Freq: Once | INTRAVENOUS | Status: AC
  Administered 2016-06-19: 500 mL via INTRAVENOUS

## 2016-06-19 MED ORDER — LORAZEPAM 2 MG/ML IJ SOLN
1.0000 mg | Freq: Once | INTRAMUSCULAR | Status: AC
  Administered 2016-06-19: 1 mg via INTRAVENOUS

## 2016-06-19 MED ORDER — LORAZEPAM 2 MG/ML IJ SOLN
INTRAMUSCULAR | Status: AC
  Filled 2016-06-19: qty 1

## 2016-06-19 MED ORDER — SODIUM CHLORIDE 0.9 % IV SOLN
100.0000 mL/h | INTRAVENOUS | Status: DC
  Administered 2016-06-19: 100 mL/h via INTRAVENOUS

## 2016-06-19 MED ORDER — POTASSIUM CHLORIDE 10 MEQ/100ML IV SOLN
10.0000 meq | Freq: Once | INTRAVENOUS | Status: AC
  Administered 2016-06-19: 10 meq via INTRAVENOUS
  Filled 2016-06-19: qty 100

## 2016-06-19 NOTE — Consult Note (Addendum)
Admission H&P    Chief Complaint: Altered mental status and seizure-like activity.  HPI: Sarah Mcguire is an 74 y.o. female history diabetes mellitus, pulmonary embolus, DVT, atrial fibrillation, coronary artery disease, hypertension and hyperlipidemia, as well as history of anxiety, brought to the emergency room following a change in mental status as well as new onset of seizure-like activity. Patient has no history of seizure disorder. She she had an episode of unable to speak with eyes deviated upwards and shaking of extremities prior to EMS arriving. She had a similar spell in the emergency room, which was followed by family unresponsive and subsequently confused on regaining consciousness. Patient is on a benzodiazepine medication and reportedly has not missed any doses of this medication. Patient has also been on Cipro 500 mg twice a day. She was afebrile. Laboratory studies showed hyponatremia with sodium 128. Blood sugar was 175. CT scan of her head is pending.  Past Medical History:  Diagnosis Date  . Anemia   . Anxiety    SEVERE  . Arthritis    "probably in my back" (05/08/2016)  . Atrial fibrillation with RVR ([place]) 11/28/2012  . Chronic lower back pain   . Coronary artery disease   . Diverticulitis   . DVT (deep venous thrombosis) ([place])   . Emphysema of lung ([place])   . GERD (gastroesophageal reflux disease)   . High cholesterol   . Hyperglycemia   . Hyperlipidemia   . Hypertension   . Impaired mobility    ambulates with walker.  . Leukocytosis   . Multiple drug allergies   . Neuropathy ([place])   . On home oxygen therapy    "2L; 24/7 prn" (05/08/2016)  . PE (pulmonary embolism)    Bilateral PE, stringy saddle embolus with predominant proximal lower lobe pulmonary artery involvement 06/26/12  . Type II diabetes mellitus ([place])     Past Surgical History:  Procedure Laterality Date  . CARDIAC CATHETERIZATION N/A 05/30/2016   Procedure: Left Heart Cath and Coronary Angiography;   Surgeon: Peter M Martinique, MD;  Location: [facility];  Service: Cardiovascular;  Laterality: N/A;  . CATARACT EXTRACTION W/ INTRAOCULAR LENS  IMPLANT, BILATERAL Bilateral 08/2013  . JOINT REPLACEMENT    . TONSILLECTOMY    . TOTAL KNEE ARTHROPLASTY Left 11/24/2012   Procedure: TOTAL KNEE ARTHROPLASTY;  Surgeon: Kerin Salen, MD;  Location: [facility];  Service: Orthopedics;  Laterality: Left;  . TYMPANOSTOMY TUBE PLACEMENT Right early 2000s    Family History  Problem Relation Age of Onset  . Diabetes Mother   . Hypertension Mother   . Heart disease Father     MI age 50's   . Diabetes Brother   . Hypertension Brother    Social History:  reports that she quit smoking about 3 months ago. Her smoking use included Cigarettes. She has a 30.00 pack-year smoking history. She has never used smokeless tobacco. She reports that she does not drink alcohol or use drugs.  Allergies:  Allergies  Allergen Reactions  . Morphine And Related Other (See Comments)    Usually drops her blood pressure  . Cymbalta [Duloxetine Hcl] Other (See Comments)    Nausea, headache and diarrhea   . Amoxicillin Other (See Comments)    Unknown Has patient had a PCN reaction causing immediate rash, facial/tongue/throat swelling, SOB or lightheadedness with hypotension:unsure Has patient had a PCN reaction causing severe rash involving mucus membranes or skin necrosis:unsure Has patient had a PCN reaction that required hospitalization:unsure  Has patient had a PCN reaction occurring within the last 10 years:unsure If all of the above answers are "NO", then may proceed with Cephalosporin use.     Marland Kitchen Amoxicillin-Pot Clavulanate Other (See Comments)    Has patient had a PCN reaction causing immediate rash, facial/tongue/throat swelling, SOB or lightheadedness with hypotension:unsure Has patient had a PCN reaction causing severe rash involving mucus membranes or skin necrosis:unsure Has patient had a PCN reaction that  required hospitalization:unsure Has patient had a PCN reaction occurring within the last 10 years:unsure If all of the above answers are "NO", then may proceed with Cephalosporin use.    . Citalopram Hydrobromide Itching       . Clonazepam Itching  . Macrobid [Nitrofurantoin Macrocrystal] Nausea And Vomiting  . Penicillins Other (See Comments)    Unknown Has patient had a PCN reaction causing immediate rash, facial/tongue/throat swelling, SOB or lightheadedness with hypotension:unsure Has patient had a PCN reaction causing severe rash involving mucus membranes or skin necrosis:unsure Has patient had a PCN reaction that required hospitalization:unsure Has patient had a PCN reaction occurring within the last 10 years:unsure If all of the above answers are "NO", then may proceed with Cephalosporin use.      . Pyridium [Phenazopyridine Hcl] Other (See Comments)    headache  . Sertraline Hcl Anxiety  . Wellbutrin [Bupropion] Itching, Rash and Other (See Comments)    Itching, rash, hyper   . Zonegran Other (See Comments)    Unknown   . Zonisamide Other (See Comments)    Unknown     ROS: History obtained from spouse and the patient.  General ROS: negative for - chills, fatigue, fever, night sweats, weight gain or weight loss Psychological ROS: negative for - behavioral disorder, hallucinations, memory difficulties, mood swings or suicidal ideation Ophthalmic ROS: negative for - blurry vision, double vision, eye pain or loss of vision ENT ROS: negative for - epistaxis, nasal discharge, oral lesions, sore throat, tinnitus or vertigo Allergy and Immunology ROS: negative for - hives or itchy/watery eyes Hematological and Lymphatic ROS: negative for - bleeding problems, bruising or swollen lymph nodes Endocrine ROS: negative for - galactorrhea, hair pattern changes, polydipsia/polyuria or temperature intolerance Respiratory ROS: negative for - cough, hemoptysis, shortness of breath  or wheezing Cardiovascular ROS: negative for - chest pain, dyspnea on exertion, edema or irregular heartbeat Gastrointestinal ROS: negative for - abdominal pain, diarrhea, hematemesis, nausea/vomiting or stool incontinence Genito-Urinary ROS: negative for - dysuria, hematuria, incontinence or urinary frequency/urgency Musculoskeletal ROS: Painful cramping of her feet over the past 2 days Neurological ROS: as noted in HPI Dermatological ROS: negative for rash and skin lesion changes  Physical Examination: Blood pressure (!) 141/118, pulse 110, temperature 97.9 F (36.6 C), resp. rate 19, SpO2 90 %.  HEENT-  Normocephalic, no lesions, without obvious abnormality.  Normal external eye and conjunctiva.  Normal TM's bilaterally.  Normal auditory canals and external ears. Normal external nose, mucus membranes and septum.  Normal pharynx. Neck supple with no masses, nodes, nodules or enlargement. Cardiovascular - tachycardic, regular rhythm, normal S1 and S2, no murmur or gallop. Lungs - chest clear, no wheezing, rales, normal symmetric air entry Abdomen - soft, non-tender; bowel sounds normal; no masses,  no organomegaly Extremities - no joint deformities, effusion, or inflammation  Neurologic Examination: Mental Status: Alert, oriented, no acute distress.  Speech fluent without evidence of aphasia. Able to follow commands without difficulty. Cranial Nerves: II-Visual fields were normal. III/IV/VI-Pupils were equal and reacted normally to light.  Extraocular movements were full and conjugate.    V/VII-no facial numbness and no facial weakness. VIII-normal. X-normal speech and symmetrical palatal movement. XI: trapezius strength/neck flexion strength normal bilaterally XII-midline tongue extension with normal strength. Motor: 5/5 bilaterally with normal tone and bulk Sensory: Normal throughout. Deep Tendon Reflexes: 1+ and symmetric. Plantars: Mute bilaterally Cerebellar: Normal  finger-to-nose testing. Carotid auscultation: Normal  Results for orders placed or performed during the hospital encounter of 06/19/16 (from the past 48 hour(s))  CBG monitoring, ED     Status: Abnormal   Collection Time: 06/19/16  8:07 PM  Result Value Ref Range   Glucose-Capillary 175 (H) 65 - 99 mg/dL  Protime-INR     Status: Abnormal   Collection Time: 06/19/16  8:59 PM  Result Value Ref Range   Prothrombin Time 15.4 (H) 11.4 - 15.2 seconds   INR 1.21   APTT     Status: None   Collection Time: 06/19/16  8:59 PM  Result Value Ref Range   aPTT 35 24 - 36 seconds  CBC     Status: Abnormal   Collection Time: 06/19/16  8:59 PM  Result Value Ref Range   WBC 10.3 4.0 - 10.5 K/uL   RBC 4.02 3.87 - 5.11 MIL/uL   Hemoglobin 10.9 (L) 12.0 - 15.0 g/dL   HCT 33.9 (L) 36.0 - 46.0 %   MCV 84.3 78.0 - 100.0 fL   MCH 27.1 26.0 - 34.0 pg   MCHC 32.2 30.0 - 36.0 g/dL   RDW 17.3 (H) 11.5 - 15.5 %   Platelets 483 (H) 150 - 400 K/uL  Differential     Status: None   Collection Time: 06/19/16  8:59 PM  Result Value Ref Range   Neutrophils Relative % 74 %   Neutro Abs 7.7 1.7 - 7.7 K/uL   Lymphocytes Relative 16 %   Lymphs Abs 1.6 0.7 - 4.0 K/uL   Monocytes Relative 9 %   Monocytes Absolute 0.9 0.1 - 1.0 K/uL   Eosinophils Relative 1 %   Eosinophils Absolute 0.1 0.0 - 0.7 K/uL   Basophils Relative 0 %   Basophils Absolute 0.0 0.0 - 0.1 K/uL  Comprehensive metabolic panel     Status: Abnormal   Collection Time: 06/19/16  8:59 PM  Result Value Ref Range   Sodium 129 (L) 135 - 145 mmol/L   Potassium 3.0 (L) 3.5 - 5.1 mmol/L   Chloride 88 (L) 101 - 111 mmol/L   CO2 22 22 - 32 mmol/L   Glucose, Bld 206 (H) 65 - 99 mg/dL   BUN 6 6 - 20 mg/dL   Creatinine, Ser 0.93 0.44 - 1.00 mg/dL   Calcium 9.9 8.9 - 10.3 mg/dL   Total Protein 6.7 6.5 - 8.1 g/dL   Albumin 3.9 3.5 - 5.0 g/dL   AST 56 (H) 15 - 41 U/L   ALT 31 14 - 54 U/L   Alkaline Phosphatase 46 38 - 126 U/L   Total Bilirubin 0.6 0.3 -  1.2 mg/dL   GFR calc non Af Amer 58 (L) >60 mL/min   GFR calc Af Amer >60 >60 mL/min    Comment: (NOTE) The eGFR has been calculated using the CKD EPI equation. This calculation has not been validated in all clinical situations. eGFR's persistently <60 mL/min signify possible Chronic Kidney Disease.    Anion gap 19 (H) 5 - 15  I-stat troponin, ED (not at [facility], [facility])     Status: None   Collection  Time: 06/19/16  9:13 PM  Result Value Ref Range   Troponin i, poc 0.01 0.00 - 0.08 ng/mL   Comment 3            Comment: Due to the release kinetics of cTnI, a negative result within the first hours of the onset of symptoms does not rule out myocardial infarction with certainty. If myocardial infarction is still suspected, repeat the test at appropriate intervals.   I-Stat Chem 8, ED  (not at [facility], [facility])     Status: Abnormal   Collection Time: 06/19/16  9:15 PM  Result Value Ref Range   Sodium 128 (L) 135 - 145 mmol/L   Potassium 3.0 (L) 3.5 - 5.1 mmol/L   Chloride 88 (L) 101 - 111 mmol/L   BUN 6 6 - 20 mg/dL   Creatinine, Ser 0.70 0.44 - 1.00 mg/dL   Glucose, Bld 186 (H) 65 - 99 mg/dL   Calcium, Ion 1.21 1.15 - 1.40 mmol/L   TCO2 23 0 - 100 mmol/L   Hemoglobin 13.3 12.0 - 15.0 g/dL   HCT 39.0 36.0 - 46.0 %  Ethanol     Status: None   Collection Time: 06/19/16  9:24 PM  Result Value Ref Range   Alcohol, Ethyl (B) <5 <5 mg/dL    Comment:        LOWEST DETECTABLE LIMIT FOR SERUM ALCOHOL IS 5 mg/dL FOR MEDICAL PURPOSES ONLY    Dg Abd Acute W/chest  Result Date: 06/18/2016 CLINICAL DATA:  75 year old female with confusion, generalized abdominal pain. Diarrhea. Sigmoid diverticulitis and pancreatic mass on recent CT. Initial encounter. EXAM: DG ABDOMEN ACUTE W/ 1V CHEST COMPARISON:  CT Abdomen and Pelvis 06/11/2016 and earlier. FINDINGS: Semi upright AP view of the chest. Stable lung volumes. Stable cardiac size and mediastinal contours. Calcified aortic atherosclerosis. Visualized  tracheal air column is within normal limits. No pneumothorax, pulmonary edema or confluent pulmonary opacity. Left-side-down lateral decubitus view of the abdomen. No pneumoperitoneum. Supine views of the abdomen demonstrate a nonobstructed bowel-gas pattern similar to that on the prior CT Abdomen and Pelvis. The urinary bladder appears distended as before. Aortoiliac calcified atherosclerosis noted. No acute osseous abnormality identified. IMPRESSION: 1.  Normal bowel gas pattern, no free air. 2.  No acute cardiopulmonary abnormality. 3.  Calcified aortic atherosclerosis. Electronically Signed   By: Genevie Ann M.D.   On: 06/18/2016 08:49   Dg Foot Complete Right  Result Date: 06/18/2016 CLINICAL DATA:  Right foot pain, no known trauma EXAM: RIGHT FOOT COMPLETE - 3+ VIEW COMPARISON:  None. FINDINGS: Three views of the right foot submitted. There is diffuse osteopenia. Small plantar spur of calcaneus. No acute fracture or subluxation. IMPRESSION: No acute fracture or subluxation. Diffuse osteopenia. Small plantar spur of calcaneus. Electronically Signed   By: Lahoma Crocker M.D.   On: 06/18/2016 08:40    Assessment/Plan 60-year-old lady with multiple medical problems presenting with altered mental status as well as seizure-like activity or possibly related to new onset hyponatremia. She is afebrile and infectious processes unlikely. Urinalysis is pending at this point, however. She has no clinical signs of an acute stroke. CT scan of her head is pending. Benzodiazepine withdrawal is unlikely with normal mental status at this point and patient and family adamant that she has been compliant with taking her medications.  Recommendations: 1. CT scan of the head as planned to rule out acute intracranial hemorrhage 2. MRI of the brain without contrast to rule out possible stroke 3. EEG,  routine about study 4. No anticonvulsant medication indicated at this point 5. Slow correction of serum sodium planned 6.  Consider discontinuing Cipro, as this may be a contributing factor and patient develop and seizure-like activity  We will continue to follow this patient with you.  C.R. Nicole Kindred, [facility] [phone]  06/19/2016, 10:04 PM

## 2016-06-19 NOTE — H&P (Signed)
History and Physical    Sarah Mcguire [id number] DOB: Jan 30, 1941 DOA: 06/19/2016  PCP: Annye Asa, MD Consultants:  Eartha Inch - pulmonology; Arlyce Dice - cardiology Patient coming from: home - lives with husband  Chief Complaint: AMS  HPI: Sarah Mcguire is a 75 y.o. 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  The patient was completely out of it, wouldn't talk, rolling eyes to the back of her head, chewed on her tongue.  Husband called EMS.  This is the first time this has ever happened, to their knowledge.  Uncertain if seizure activity vs. Panic attack.  Also, toes turning upward, causing a lot of pain and making it difficult for her to walk.  This has been over the last 2-3 days.  Scheduled to have EUS for diagnosis of pancreatitic cancer but she couldn't walk so they had to cancel.  She was previously hospitalized from 10/7-11/17 for diverticulitis, at which time a CT showed an incidental mass at the pancreatic tail that appears to include possible metastatic disease.  She is still taking Cipro/Flagyl, has 3-4 days left.  No GI symptoms.  Has not been eating much.  Also had urinary retention during that hospitalization - a foley catheter was recommended but she declined.  She currently denies problems with retention.  She was seen again in the ER on 10/13 for abdominal pain and urinary retention.  She had an I/O cath and was also diagnosed with anxiety at that time.    She returned on 10/16 for panic attack, SOB, and foot pain.  She was placed in a CAM walker and given potassium for hypokalemia.  She has a h/o DVT/PE as well as afib and takes Warfarin.  This had been held since her prior hospitalization in anticipation of upcoming ERCP but she took 10 mg tonight to restart it since she had to cancel the EUS.    ED Course:  Per Dr. Tomi Bamberger: Labs reviewed.  Pt does have  hyponatremia, now 128.  Decreased from earlier in the month although similar to 4 days ago.  She does also have hypokalemia.  Etiology of this unclear.  Will need to evaluate further.  Suspect the seizure could be related to her benzodiazepines but will need to monitor the sodium.  Will consult medical service for admission.    Review of Systems: As per HPI; otherwise 10 point review of systems reviewed and negative.   Ambulatory Status:  Ambulates with a walker  Past Medical History:  Diagnosis Date  . Anemia   . Anxiety    SEVERE  . Arthritis    "probably in my back" (05/08/2016)  . Atrial fibrillation with RVR ([place]) 11/28/2012  . Chronic lower back pain   . Coronary artery disease   . Diverticulitis   . DVT (deep venous thrombosis) ([place])   . Emphysema of lung ([place])   . GERD (gastroesophageal reflux disease)   . High cholesterol   . Hyperglycemia   . Hyperlipidemia   . Hypertension   . Impaired mobility    ambulates with walker.  . Leukocytosis   . Multiple drug allergies   . Neuropathy ([place])   . On home oxygen therapy    "2L; 24/7 prn" (05/08/2016)  . PE (pulmonary embolism)    Bilateral PE, stringy saddle embolus with predominant proximal lower lobe pulmonary artery involvement 06/26/12  . Type II diabetes mellitus ([place])  Past Surgical History:  Procedure Laterality Date  . CARDIAC CATHETERIZATION N/A 05/30/2016   Procedure: Left Heart Cath and Coronary Angiography;  Surgeon: Peter M Martinique, MD;  Location: [facility];  Service: Cardiovascular;  Laterality: N/A;  . CATARACT EXTRACTION W/ INTRAOCULAR LENS  IMPLANT, BILATERAL Bilateral 08/2013  . JOINT REPLACEMENT    . TONSILLECTOMY    . TOTAL KNEE ARTHROPLASTY Left 11/24/2012   Procedure: TOTAL KNEE ARTHROPLASTY;  Surgeon: Kerin Salen, MD;  Location: [facility];  Service: Orthopedics;  Laterality: Left;  . TYMPANOSTOMY TUBE PLACEMENT Right early 2000s    Social History   Social History  . Marital status: Married      Spouse name: N/A  . Number of children: N/A  . Years of education: N/A   Occupational History  . Not on file.   Social History Main Topics  . Smoking status: Former Smoker    Packs/day: 0.50    Years: 60.00    Types: Cigarettes    Quit date: 03/18/2016  . Smokeless tobacco: Never Used  . Alcohol use No  . Drug use: No  . Sexual activity: Yes   Other Topics Concern  . Not on file   Social History Narrative  . No narrative on file    Allergies  Allergen Reactions  . Morphine And Related Other (See Comments)    Usually drops her blood pressure  . Cymbalta [Duloxetine Hcl] Other (See Comments)    Nausea, headache and diarrhea   . Amoxicillin Other (See Comments)    Unknown Has patient had a PCN reaction causing immediate rash, facial/tongue/throat swelling, SOB or lightheadedness with hypotension:unsure Has patient had a PCN reaction causing severe rash involving mucus membranes or skin necrosis:unsure Has patient had a PCN reaction that required hospitalization:unsure Has patient had a PCN reaction occurring within the last 10 years:unsure If all of the above answers are "NO", then may proceed with Cephalosporin use.     Marland Kitchen Amoxicillin-Pot Clavulanate Other (See Comments)    Has patient had a PCN reaction causing immediate rash, facial/tongue/throat swelling, SOB or lightheadedness with hypotension:unsure Has patient had a PCN reaction causing severe rash involving mucus membranes or skin necrosis:unsure Has patient had a PCN reaction that required hospitalization:unsure Has patient had a PCN reaction occurring within the last 10 years:unsure If all of the above answers are "NO", then may proceed with Cephalosporin use.    . Citalopram Hydrobromide Itching       . Clonazepam Itching  . Macrobid [Nitrofurantoin Macrocrystal] Nausea And Vomiting  . Penicillins Other (See Comments)    Unknown Has patient had a PCN reaction causing immediate rash,  facial/tongue/throat swelling, SOB or lightheadedness with hypotension:unsure Has patient had a PCN reaction causing severe rash involving mucus membranes or skin necrosis:unsure Has patient had a PCN reaction that required hospitalization:unsure Has patient had a PCN reaction occurring within the last 10 years:unsure If all of the above answers are "NO", then may proceed with Cephalosporin use.      . Pyridium [Phenazopyridine Hcl] Other (See Comments)    headache  . Sertraline Hcl Anxiety  . Wellbutrin [Bupropion] Itching, Rash and Other (See Comments)    Itching, rash, hyper   . Zonegran Other (See Comments)    Unknown   . Zonisamide Other (See Comments)    Unknown     Family History  Problem Relation Age of Onset  . Diabetes Mother   . Hypertension Mother   . Heart disease Father  MI age 31's   . Diabetes Brother   . Hypertension Brother     Prior to Admission medications   Medication Sig Start Date End Date Taking? Authorizing Provider  acetaminophen (TYLENOL) 325 MG tablet Take 325-650 mg by mouth every 6 (six) hours as needed (for headache).    Yes Historical Provider, MD  albuterol (PROVENTIL HFA;VENTOLIN HFA) 108 (90 Base) MCG/ACT inhaler Inhale 2 puffs into the lungs every 6 (six) hours as needed for wheezing or shortness of breath. 04/17/16  Yes Tammy S Parrett, NP  alprazolam (XANAX) 2 MG tablet Take 1 tablet (2 mg total) by mouth 3 (three) times daily. Patient taking differently: Take 1 mg by mouth 6 (six) times daily.  06/13/16  Yes Theodis Blaze, MD  Cholecalciferol (VITAMIN D) 2000 UNITS tablet Take 2,000 Units by mouth daily.   Yes Historical Provider, MD  ciprofloxacin (CIPRO) 500 MG tablet Take 1 tablet (500 mg total) by mouth 2 (two) times daily. Patient taking differently: Take 500 mg by mouth 2 (two) times daily. Started 10/11 for 10 days 06/13/16  Yes Theodis Blaze, MD  clobetasol cream (TEMOVATE) [id number] % Apply 1 application topically 2 (two) times  daily as needed (for yeast infection).   Yes Historical Provider, MD  enoxaparin (LOVENOX) 80 MG/0.8ML injection Inject 0.8 mLs (80 mg total) into the skin every 12 (twelve) hours. Stop taking after the dose on October 17th, 2017 06/13/16  Yes Iskra Barbera Setters, MD  esomeprazole (NEXIUM) 40 MG capsule Take 1 capsule (40 mg total) by mouth daily as needed. For acid reflux. Patient taking differently: Take 40 mg by mouth daily at 12 noon. For acid reflux. 05/24/15  Yes Midge Minium, MD  fenofibrate 160 MG tablet Take 1 tablet (160 mg total) by mouth daily. 03/20/16  Yes Midge Minium, MD  Fluticasone Furoate-Vilanterol (BREO ELLIPTA IN) Inhale 1 puff into the lungs daily.   Yes Historical Provider, MD  folic acid (FOLVITE) 1 MG tablet Take 1 tablet (1 mg total) by mouth daily. 05/31/14  Yes Midge Minium, MD  furosemide (LASIX) 40 MG tablet TAKE 1 TABLET BY MOUTH EVERY DAY Patient taking differently: Take 20 mg by mouth every day 04/23/16  Yes Midge Minium, MD  HYDROcodone-acetaminophen (NORCO/VICODIN) 5-325 MG tablet Take 1-2 tablets by mouth every 4 (four) hours as needed. Patient taking differently: Take 0.5 tablets by mouth every 4 (four) hours as needed for moderate pain or severe pain.  06/13/16  Yes Theodis Blaze, MD  levothyroxine (SYNTHROID, LEVOTHROID) 50 MCG tablet TAKE 1 TABLET (50 MCG TOTAL) BY MOUTH DAILY. 06/18/16  Yes Midge Minium, MD  lisinopril (PRINIVIL,ZESTRIL) 10 MG tablet TAKE 1 TABLET BY MOUTH EVERY DAY Patient taking differently: Take 10 mg by mouth every day 05/28/16  Yes Midge Minium, MD  metFORMIN (GLUCOPHAGE-XR) 500 MG 24 hr tablet Take 500-1,000 mg by mouth See admin instructions. Pt takes 1000mg  in morning after breakfast, 500mg  in evening after dinner   Yes Historical Provider, MD  metoprolol succinate (TOPROL XL) 25 MG 24 hr tablet Take 1 tablet (25 mg total) by mouth daily. 05/22/16  Yes Eileen Stanford, PA-C  metroNIDAZOLE (FLAGYL) 500 MG  tablet Take 1 tablet (500 mg total) by mouth every 8 (eight) hours. Patient taking differently: Take 500 mg by mouth every 8 (eight) hours. Started 10/11 for 10 days 06/13/16  Yes Theodis Blaze, MD  Multiple Vitamin (MULTIVITAMIN) tablet Take 1 tablet by mouth daily.  Yes Historical Provider, MD  OXYGEN Inhale 2 L into the lungs daily as needed (during activity and while sleeping).    Yes Historical Provider, MD  sertraline (ZOLOFT) 25 MG tablet Take 1 tablet (25 mg total) by mouth at bedtime. 06/04/16  Yes Midge Minium, MD  simvastatin (ZOCOR) 20 MG tablet TAKE 1 TABLET BY MOUTH EVERY EVENING Patient taking differently: Take 20 mg by mouth every evening 05/28/16  Yes Midge Minium, MD  terconazole (TERAZOL 7) 0.4 % vaginal cream Place 1 applicator vaginally at bedtime as needed (itching).   Yes Historical Provider, MD  warfarin (COUMADIN) 5 MG tablet Take 2.5-5 mg by mouth See admin instructions. Takes 5mg  Tuesday, Thursday, and Sunday, then takes 2.5mg  on Monday, Friday, and Saturday. Nothing on Wednesday 05/28/16  Yes Historical Provider, MD  glucose blood (ONE TOUCH ULTRA TEST) test strip TEST TWICE A DAY AS DIRECTED 12/19/15   Midge Minium, MD  potassium chloride SA (K-DUR,KLOR-CON) 20 MEQ tablet Take 1 tablet (20 mEq total) by mouth daily. 06/18/16   Milton Ferguson, MD    Physical Exam: Vitals:   06/19/16 2030 06/19/16 2100 06/19/16 2246 06/19/16 2300  BP: 128/79 114/81 107/65 124/89  Pulse: 100     Resp:  20 20 21   Temp:      SpO2: 98%        General:  Appears anxious, minimally interactive but does answer questions when posed directly, closes her eyes often during my visit Eyes:  PERRL, EOMI, normal lids, iris ENT:  grossly normal hearing, lips & tongue, mmm Neck:  no LAD, masses or thyromegaly Cardiovascular:  RRR, no m/r/g. No LE edema.  Respiratory:  CTA bilaterally, no w/r/r. Normal respiratory effort. Abdomen:  soft, ntnd, NABS Skin:  no rash or induration  seen on limited exam Musculoskeletal:  grossly normal tone BUE/BLE, good ROM, no bony abnormality Psychiatric: unusual affect, +anxiety, speech fluent and appropriate, AOx3 Neurologic:  CN 2-12 grossly intact, moves all extremities in coordinated fashion, sensation intact  Labs on Admission: I have personally reviewed following labs and imaging studies  CBC:  Recent Labs Lab 06/15/16 1951 06/18/16 0814 06/19/16 2059 06/19/16 2115  WBC 9.3  --  10.3  --   NEUTROABS  --   --  7.7  --   HGB 10.8* 13.3 10.9* 13.3  HCT 33.3* 39.0 33.9* 39.0  MCV 82.8  --  84.3  --   PLT 446*  --  483*  --    Basic Metabolic Panel:  Recent Labs Lab 06/15/16 1951 06/18/16 0814 06/19/16 2059 06/19/16 2115  NA 129* 128* 129* 128*  K 3.9 3.0* 3.0* 3.0*  CL 92* 88* 88* 88*  CO2 23  --  22  --   GLUCOSE 118* 134* 206* 186*  BUN 8 5* 6 6  CREATININE 0.85 0.60 0.93 0.70  CALCIUM 10.2  --  9.9  --    GFR: Estimated Creatinine Clearance: 58 mL/min (by C-G formula based on SCr of 0.7 mg/dL). Liver Function Tests:  Recent Labs Lab 06/15/16 1951 06/19/16 2059  AST 25 56*  ALT 12* 31  ALKPHOS 43 46  BILITOT 0.3 0.6  PROT 6.6 6.7  ALBUMIN 3.9 3.9    Recent Labs Lab 06/15/16 1951  LIPASE 17   No results for input(s): AMMONIA in the last 168 hours. Coagulation Profile:  Recent Labs Lab 06/13/16 0332 06/19/16 2059  INR 1.34 1.21   Cardiac Enzymes: No results for input(s): CKTOTAL, CKMB, CKMBINDEX,  TROPONINI in the last 168 hours. BNP (last 3 results) No results for input(s): PROBNP in the last 8760 hours. HbA1C: No results for input(s): HGBA1C in the last 72 hours. CBG:  Recent Labs Lab 06/13/16 0814 06/13/16 1136 06/19/16 2007  GLUCAP 162* 149* 175*   Lipid Profile: No results for input(s): CHOL, HDL, LDLCALC, TRIG, CHOLHDL, LDLDIRECT in the last 72 hours. Thyroid Function Tests: No results for input(s): TSH, T4TOTAL, FREET4, T3FREE, THYROIDAB in the last 72  hours. Anemia Panel: No results for input(s): VITAMINB12, FOLATE, FERRITIN, TIBC, IRON, RETICCTPCT in the last 72 hours. Urine analysis:    Component Value Date/Time   COLORURINE YELLOW 06/19/2016 2307   APPEARANCEUR CLEAR 06/19/2016 2307   LABSPEC 1.007 06/19/2016 2307   PHURINE 7.0 06/19/2016 2307   GLUCOSEU NEGATIVE 06/19/2016 2307   HGBUR NEGATIVE 06/19/2016 2307   HGBUR negative 10/24/2010 1140   BILIRUBINUR NEGATIVE 06/19/2016 2307   BILIRUBINUR negative 06/04/2016 1108   [place] 06/19/2016 2307   PROTEINUR NEGATIVE 06/19/2016 2307   UROBILINOGEN 0.2 06/04/2016 1108   UROBILINOGEN 0.2 04/04/2015 1328   NITRITE NEGATIVE 06/19/2016 2307   LEUKOCYTESUR NEGATIVE 06/19/2016 2307    Creatinine Clearance: Estimated Creatinine Clearance: 58 mL/min (by C-G formula based on SCr of 0.7 mg/dL).  Sepsis Labs: @LABRCNTIP (procalcitonin:4,lacticidven:4) ) No results found for this or any previous visit (from the past 240 hour(s)).   Radiological Exams on Admission: Ct Head Wo Contrast  Result Date: 06/19/2016 CLINICAL DATA:  75 year old female with high blood pressure and possible stroke. EXAM: CT HEAD WITHOUT CONTRAST TECHNIQUE: Contiguous axial images were obtained from the base of the skull through the vertex without intravenous contrast. COMPARISON:  None. FINDINGS: Brain: The ventricles and sulci are appropriate size for patient's age. Mild periventricular and deep white matter chronic microvascular ischemic changes noted. Areas of low attenuation involving the periventricular white matter and adjacent to the basal ganglia in the frontal lobe bilaterally are likely chronic and related to microvascular ischemic changes. Acute ischemia is less likely. MRI may provide better evaluation if there is high clinical concern for acute infarct. There is no acute intracranial hemorrhage. No mass effect or midline shift noted. No extra-axial fluid collection. Vascular: No hyperdense  vessel or unexpected calcification. Skull: Normal. Negative for fracture or focal lesion. Sinuses/Orbits: No acute finding. Other: None IMPRESSION: No acute intracranial hemorrhage. Mild age-related atrophy and chronic microvascular ischemic disease. If symptoms persist and there are no contraindications, MRI may provide better evaluation if clinically indicated Electronically Signed   By: Anner Crete M.D.   On: 06/19/2016 22:49   Dg Abd Acute W/chest  Result Date: 06/18/2016 CLINICAL DATA:  75 year old female with confusion, generalized abdominal pain. Diarrhea. Sigmoid diverticulitis and pancreatic mass on recent CT. Initial encounter. EXAM: DG ABDOMEN ACUTE W/ 1V CHEST COMPARISON:  CT Abdomen and Pelvis 06/11/2016 and earlier. FINDINGS: Semi upright AP view of the chest. Stable lung volumes. Stable cardiac size and mediastinal contours. Calcified aortic atherosclerosis. Visualized tracheal air column is within normal limits. No pneumothorax, pulmonary edema or confluent pulmonary opacity. Left-side-down lateral decubitus view of the abdomen. No pneumoperitoneum. Supine views of the abdomen demonstrate a nonobstructed bowel-gas pattern similar to that on the prior CT Abdomen and Pelvis. The urinary bladder appears distended as before. Aortoiliac calcified atherosclerosis noted. No acute osseous abnormality identified. IMPRESSION: 1.  Normal bowel gas pattern, no free air. 2.  No acute cardiopulmonary abnormality. 3.  Calcified aortic atherosclerosis. Electronically Signed   By: Herminio Heads.D.  On: 06/18/2016 08:49   Dg Foot Complete Right  Result Date: 06/18/2016 CLINICAL DATA:  Right foot pain, no known trauma EXAM: RIGHT FOOT COMPLETE - 3+ VIEW COMPARISON:  None. FINDINGS: Three views of the right foot submitted. There is diffuse osteopenia. Small plantar spur of calcaneus. No acute fracture or subluxation. IMPRESSION: No acute fracture or subluxation. Diffuse osteopenia. Small plantar spur of  calcaneus. Electronically Signed   By: Lahoma Crocker M.D.   On: 06/18/2016 08:40    EKG: Independently reviewed.  Afib with rate 94; nonspecific ST changes with no evidence of acute ischemia  Assessment/Plan Principal Problem:   Altered mental status Active Problems:   Diabetes mellitus with neuropathy (HCC)   Anemia   Anxiety and depression   Essential hypertension   Long term current use of anticoagulant therapy   Foot pain   Atrial fibrillation (HCC)   Hypothyroidism   COPD (chronic obstructive pulmonary disease) (HCC)   Chronic systolic CHF (congestive heart failure) (HCC)   Pancreatic mass   Diverticulitis   Hyponatremia   Altered mental status -This is a patient with a significant h/o anxiety, with great reason to have current anxiety, presenting with what she says was a panic attack -There may be more to it, and neurology has consulted -CT negative -Will obtain MRI -Agree with plan to perform EEG, as this may lay the seizure question to rest -For now will continue home BZD regimen and follow  Pancreatic mass -This is actually my greatest concern for this patient -Based on CT results, it appears to be a pancreatic cancer with possible metastatic disease -If this is the case, her prognosis is quite poor -She needs a tissue diagnosis -Would suggest GI consult in AM for attempted tissue diagnosis -I suspect that her 3 recent ER visits are evidence of GI discomfort and anxiety associated with this issue -Would suggest consideration of palliative care consult  Anticoagulation -H/o PE, afib -Warfarin was stopped following last hospitalization in anticipation of upcoming EUS (cancelled as outpatient) -Unfortunately, patient did take 10 mg tonight prior to presentation -Will repeat INR in AM -Consider vitamin K for reversal if needed -Will place on Heparin drip while awaiting GI input  DM -Hold Metformin -Cover with SSI  Anemia   Anxiety -Continue q4h Xanax and  daily Zoloft -May need additional psychiatric support based on probably diagnosis -Patient appeared to be unaware of pancreatic mass and likely implications; counseling provided  HTN -Continue Lisinopril, Toprol -Will follow  Foot pain -Likely related to hypokalemia, will replete -PT consult  Hypothyroidism -Normal TSH in 8/17 -Continue Synthroid at current dose for now  COPD -Continue home meds  CHF -05/21/16 echo: EF [id number], grade 1 diastolic dysfunction -Currently appears to be compensated  Hyponatremia -Prior h/o hyponatremia but normal on 10/8 and 10/10 and falling since -May be related to her spells today, but less likely than other causes -Will gently rehydrate and follow sodium  Diverticulitis -Diagnosed on last hospitalization -Has completed most of antibiotics -No further symptoms -Neuro recommends stopping Cipro due to possible seizure symptoms -Will stop both medications at this time  DVT prophylaxis: Heparin drip Code Status: Full - confirmed with patient/family Family Communication: Husband present throughout Disposition Plan: Home once clinically improved Consults called: Neurology, PT; suggest GI Admission status: Admit - It is my clinical opinion that admission to INPATIENT is reasonable and necessary because this patient will require at least 2 midnights in the hospital to treat this condition based on the medical complexity of  the problems presented.  Given the aforementioned information, the predictability of an adverse outcome is felt to be significant.    Karmen Bongo MD [facility]  If 7PM-7AM, please contact night-coverage [email or web address] Password [email or web address]  06/20/2016, 2:03 AM

## 2016-06-19 NOTE — Telephone Encounter (Signed)
Sarah Mcguire,  She cancelled her EUS for this Thursday.   Krista Blue please let me know if she changes her mind when you see her later this week and I can get her back on schedule.

## 2016-06-19 NOTE — Telephone Encounter (Signed)
My scheduler called her and she does not want to schedule f/u with me either. I spoke with her husband, he states she has leg pains, can not walk and go anywhere. I encouraged him to call us back if she feels better and is able to come in. She has a visiting nurse now. Based on her scan findings and significantly elevated CA19.9, I think she likely has pancreatic cancer.   Truitt Merle MD

## 2016-06-19 NOTE — Telephone Encounter (Signed)
Please advise 

## 2016-06-19 NOTE — Telephone Encounter (Signed)
Husband is calling in today asking for a home health nurse to come out to their home, states that pt is still unable to walk.

## 2016-06-19 NOTE — Patient Outreach (Signed)
Care Coordination: Incoming call from MD office about home visit assessment.  I communicated that patient has been difficult to engage and I was planning to call patient on 06/22/2016 after procedure on 10/19.   Reviewed that I had offered to schedule a  home visit last week and patient declined.  Received a voicemail from MD stating patient will accept a home visit.   Placed call to patient and explained reason for call.  Offered home visit for 06/21/2016 at 1pm and patient has accepted.  Confirmed address.   Plan: home visit on 06/21/2016  Tomasa Rand, RN, BSN, [facility] [employer] [phone]

## 2016-06-19 NOTE — ED Triage Notes (Signed)
Pt presents to ed with ems with complaints of altered mental status that started at 1830, per the husband she is usually alert and oriented, per ems she was "catatonic" on his arrival, the patient would not speak to him at home and in the back of the truck refused all of his treatment, on arrival to ed she will not speak to [place] and is having periods of tremors and rolling her eyes to the side and then will look at you and do it again er md notified

## 2016-06-19 NOTE — Telephone Encounter (Signed)
Pt is enrolled in [facility] program.  Please call Tomasa Rand at [phone] and see if there are able to mobilize services for pt or assess her needs.

## 2016-06-19 NOTE — ED Provider Notes (Addendum)
[facility] DEPT Provider Note   CSN: [id number] Arrival date & time: 06/19/16  1958     History   Chief Complaint Chief Complaint  Patient presents with  . Altered Mental Status    HPI Sarah Mcguire is a 75 y.o. female.  HPI Patient presents to the emergency room with altered mental status. Patient has a history of panic attacks. Initially when the patient arrived to the emergency room she was not communicating with staff. According to the EMS report the patient was last seen normal at about 6 PM.  When I arrived at the bedside, initially the patient was not communicating with me but she would look at me. She started to follow commands and would hold her arms and legs up. When asked if she could tell me her name she nodded no would not speak.  Eventually the patient started answering questions when I asked her what's wrong she said I am  having a panic attack. Past Medical History:  Diagnosis Date  . Anemia   . Anxiety    SEVERE  . Arthritis    "probably in my back" (05/08/2016)  . Atrial fibrillation with RVR ([employer]) 11/28/2012  . Chronic lower back pain   . Coronary artery disease   . Diverticulitis   . DVT (deep venous thrombosis) ([place])   . Emphysema of lung ([place])   . GERD (gastroesophageal reflux disease)   . High cholesterol   . Hyperglycemia   . Hyperlipidemia   . Hypertension   . Impaired mobility    ambulates with walker.  . Leukocytosis   . Multiple drug allergies   . Neuropathy ([place])   . On home oxygen therapy    "2L; 24/7 prn" (05/08/2016)  . PE (pulmonary embolism)    Bilateral PE, stringy saddle embolus with predominant proximal lower lobe pulmonary artery involvement 06/26/12  . Type II diabetes mellitus [facility])     Patient Active Problem List   Diagnosis Date Noted  . Sigmoid diverticulitis 06/10/2016  . Pancreatic mass 06/10/2016  . Cholelithiases 06/10/2016  . Diverticulitis 06/10/2016  . Gallstones   . Hypotension   . Chronic systolic CHF  (congestive heart failure) ([place]) 05/30/2016  . COPD (chronic obstructive pulmonary disease) ([place]) 04/02/2016  . Chronic hypoxemic respiratory failure ([place]) 03/19/2016  . Hypothyroidism 09/14/2015  . Borderline low O2 saturation 05/24/2015  . Premature atrial contraction 10/21/2014  . Abnormal EKG 10/21/2014  . History of pulmonary embolism 10/21/2014  . Atrial flutter ([place]) 08/30/2014  . Right hip pain 08/01/2014  . Atypical chest pain 04/22/2014  . Vaginitis and vulvovaginitis 03/22/2014  . UTI symptoms 03/22/2014  . Knee pain 01/11/2014  . External hemorrhoid, bleeding 11/02/2013  . Encounter for therapeutic drug monitoring 10/15/2013  . Cataract of both eyes 08/03/2013  . Right groin pain 03/31/2013  . Insomnia 12/09/2012  . Atrial fibrillation with RVR ([place]) 11/28/2012  . Osteoarthritis of left knee 11/26/2012  . Leg pain 11/03/2012  . Left knee pain 10/31/2012  . Long term (current) use of anticoagulants 07/16/2012  . PE (pulmonary embolism) 06/30/2012  . GERD (gastroesophageal reflux disease) 06/26/2012  . Anxiety 06/26/2012  . DJD (degenerative joint disease) 06/26/2012  . Palpitations 06/25/2012  . Chest pain 02/13/2012  . Fatigue 02/01/2012  . Panic attack 11/13/2011  . General medical examination 07/21/2011  . Osteoporosis 07/09/2011  . Right sided abdominal pain 03/26/2011  . Plantar fasciitis 03/13/2011  . DYSURIA 10/24/2010  . RHINITIS 10/02/2010  . CARDIOMYOPATHY, DILATED 08/01/2010  .  OBESITY 05/05/2010  . HIP PAIN 02/28/2010  . PLANTAR FASCIITIS, RIGHT 02/24/2010  . EDEMA- LOCALIZED 02/20/2010  . PORTAL VEIN THROMBOSIS 01/23/2010  . HEART FAILURE, CONGESTIVE UNSPEC 01/09/2010  . CANDIDIASIS OF MOUTH 01/02/2010  . ANEMIA 01/02/2010  . Leukocytosis 01/02/2010  . THROMBOCYTOSIS 01/02/2010  . GERD 01/02/2010  . VAGINITIS, ATROPHIC 12/05/2009  . BACK PAIN, CHRONIC 11/08/2009  . Diabetes mellitus with neuropathy ([place]) 11/07/2009  . SMOKER 08/31/2009  .  UTI 06/13/2009  . VAGINITIS 06/06/2009  . EUSTACHIAN TUBE DYSFUNCTION 03/28/2009  . HEART MURMUR, SYSTOLIC 0000000  . SINUSITIS- ACUTE-NOS 11/01/2008  . DIVERTICULITIS OF COLON 09/06/2008  . Hyperlipidemia associated with type 2 diabetes mellitus ([place]) 07/30/2008  . Anxiety and depression 07/30/2008  . Essential hypertension 07/30/2008    Past Surgical History:  Procedure Laterality Date  . CARDIAC CATHETERIZATION N/A 05/30/2016   Procedure: Left Heart Cath and Coronary Angiography;  Surgeon: Peter M Martinique, MD;  Location: [facility];  Service: Cardiovascular;  Laterality: N/A;  . CATARACT EXTRACTION W/ INTRAOCULAR LENS  IMPLANT, BILATERAL Bilateral 08/2013  . JOINT REPLACEMENT    . TONSILLECTOMY    . TOTAL KNEE ARTHROPLASTY Left 11/24/2012   Procedure: TOTAL KNEE ARTHROPLASTY;  Surgeon: Kerin Salen, MD;  Location: [facility];  Service: Orthopedics;  Laterality: Left;  . TYMPANOSTOMY TUBE PLACEMENT Right early 2000s    OB History    Gravida Para Term Preterm AB Living   2 2 2     2    SAB TAB Ectopic Multiple Live Births                   Home Medications    Prior to Admission medications   Medication Sig Start Date End Date Taking? Authorizing Provider  acetaminophen (TYLENOL) 325 MG tablet Take 325-650 mg by mouth every 6 (six) hours as needed (for headache).    Yes Historical Provider, MD  albuterol (PROVENTIL HFA;VENTOLIN HFA) 108 (90 Base) MCG/ACT inhaler Inhale 2 puffs into the lungs every 6 (six) hours as needed for wheezing or shortness of breath. 04/17/16  Yes Tammy S Parrett, NP  alprazolam (XANAX) 2 MG tablet Take 1 tablet (2 mg total) by mouth 3 (three) times daily. Patient taking differently: Take 1 mg by mouth 6 (six) times daily.  06/13/16  Yes Theodis Blaze, MD  Cholecalciferol (VITAMIN D) 2000 UNITS tablet Take 2,000 Units by mouth daily.   Yes Historical Provider, MD  ciprofloxacin (CIPRO) 500 MG tablet Take 1 tablet (500 mg total) by mouth 2 (two) times  daily. Patient taking differently: Take 500 mg by mouth 2 (two) times daily. Started 10/11 for 10 days 06/13/16  Yes Theodis Blaze, MD  clobetasol cream (TEMOVATE) [id number] % Apply 1 application topically 2 (two) times daily as needed (for yeast infection).   Yes Historical Provider, MD  enoxaparin (LOVENOX) 80 MG/0.8ML injection Inject 0.8 mLs (80 mg total) into the skin every 12 (twelve) hours. Stop taking after the dose on October 17th, 2017 06/13/16  Yes Iskra Barbera Setters, MD  esomeprazole (NEXIUM) 40 MG capsule Take 1 capsule (40 mg total) by mouth daily as needed. For acid reflux. Patient taking differently: Take 40 mg by mouth daily at 12 noon. For acid reflux. 05/24/15  Yes Midge Minium, MD  fenofibrate 160 MG tablet Take 1 tablet (160 mg total) by mouth daily. 03/20/16  Yes Midge Minium, MD  Fluticasone Furoate-Vilanterol (BREO ELLIPTA IN) Inhale 1 puff into the lungs daily.  Yes Historical Provider, MD  folic acid (FOLVITE) 1 MG tablet Take 1 tablet (1 mg total) by mouth daily. 05/31/14  Yes Midge Minium, MD  furosemide (LASIX) 40 MG tablet TAKE 1 TABLET BY MOUTH EVERY DAY Patient taking differently: Take 20 mg by mouth every day 04/23/16  Yes Midge Minium, MD  HYDROcodone-acetaminophen (NORCO/VICODIN) 5-325 MG tablet Take 1-2 tablets by mouth every 4 (four) hours as needed. Patient taking differently: Take 0.5 tablets by mouth every 4 (four) hours as needed for moderate pain or severe pain.  06/13/16  Yes Theodis Blaze, MD  levothyroxine (SYNTHROID, LEVOTHROID) 50 MCG tablet TAKE 1 TABLET (50 MCG TOTAL) BY MOUTH DAILY. 06/18/16  Yes Midge Minium, MD  lisinopril (PRINIVIL,ZESTRIL) 10 MG tablet TAKE 1 TABLET BY MOUTH EVERY DAY Patient taking differently: Take 10 mg by mouth every day 05/28/16  Yes Midge Minium, MD  metFORMIN (GLUCOPHAGE-XR) 500 MG 24 hr tablet Take 500-1,000 mg by mouth See admin instructions. Pt takes 1000mg  in morning after breakfast, 500mg  in  evening after dinner   Yes Historical Provider, MD  metoprolol succinate (TOPROL XL) 25 MG 24 hr tablet Take 1 tablet (25 mg total) by mouth daily. 05/22/16  Yes Eileen Stanford, PA-C  metroNIDAZOLE (FLAGYL) 500 MG tablet Take 1 tablet (500 mg total) by mouth every 8 (eight) hours. Patient taking differently: Take 500 mg by mouth every 8 (eight) hours. Started 10/11 for 10 days 06/13/16  Yes Theodis Blaze, MD  Multiple Vitamin (MULTIVITAMIN) tablet Take 1 tablet by mouth daily.     Yes Historical Provider, MD  OXYGEN Inhale 2 L into the lungs daily as needed (during activity and while sleeping).    Yes Historical Provider, MD  sertraline (ZOLOFT) 25 MG tablet Take 1 tablet (25 mg total) by mouth at bedtime. 06/04/16  Yes Midge Minium, MD  simvastatin (ZOCOR) 20 MG tablet TAKE 1 TABLET BY MOUTH EVERY EVENING Patient taking differently: Take 20 mg by mouth every evening 05/28/16  Yes Midge Minium, MD  terconazole (TERAZOL 7) 0.4 % vaginal cream Place 1 applicator vaginally at bedtime as needed (itching).   Yes Historical Provider, MD  warfarin (COUMADIN) 5 MG tablet Take 2.5-5 mg by mouth See admin instructions. Takes 5mg  Tuesday, Thursday, and Sunday, then takes 2.5mg  on Monday, Friday, and Saturday. Nothing on Wednesday 05/28/16  Yes Historical Provider, MD  glucose blood (ONE TOUCH ULTRA TEST) test strip TEST TWICE A DAY AS DIRECTED 12/19/15   Midge Minium, MD  potassium chloride SA (K-DUR,KLOR-CON) 20 MEQ tablet Take 1 tablet (20 mEq total) by mouth daily. 06/18/16   Milton Ferguson, MD    Family History Family History  Problem Relation Age of Onset  . Diabetes Mother   . Hypertension Mother   . Heart disease Father     MI age 36's   . Diabetes Brother   . Hypertension Brother     Social History Social History  Substance Use Topics  . Smoking status: Former Smoker    Packs/day: 0.50    Years: 60.00    Types: Cigarettes    Quit date: 03/18/2016  . Smokeless tobacco:  Never Used  . Alcohol use No     Allergies   Morphine and related; Cymbalta [duloxetine hcl]; Amoxicillin; Amoxicillin-pot clavulanate; Citalopram hydrobromide; Clonazepam; Macrobid [nitrofurantoin macrocrystal]; Penicillins; Pyridium [phenazopyridine hcl]; Sertraline hcl; Wellbutrin [bupropion]; Zonegran; and Zonisamide   Review of Systems Review of Systems  All other systems reviewed  and are negative.    Physical Exam Updated Vital Signs BP (!) 141/118 (BP Location: Right Arm)   Pulse 110   Temp 97.9 F (36.6 C)   Resp 19   LMP  (LMP Unknown)   SpO2 90%   Physical Exam  Constitutional: She is oriented to person, place, and time. She appears well-developed and well-nourished. No distress.  HENT:  Head: Normocephalic and atraumatic.  Right Ear: External ear normal.  Left Ear: External ear normal.  Mouth/Throat: Oropharynx is clear and moist.  Eyes: Conjunctivae are normal. Right eye exhibits no discharge. Left eye exhibits no discharge. No scleral icterus.  Neck: Neck supple. No tracheal deviation present.  Cardiovascular: Normal rate, regular rhythm and intact distal pulses.   Pulmonary/Chest: Effort normal and breath sounds normal. No stridor. No respiratory distress. She has no wheezes. She has no rales.  Abdominal: Soft. Bowel sounds are normal. She exhibits no distension. There is no tenderness. There is no rebound and no guarding.  Musculoskeletal: She exhibits no edema or tenderness.  Neurological: She is alert and oriented to person, place, and time. She has normal strength. No cranial nerve deficit (No facial droop, extraocular movements intact, tongue midline ) or sensory deficit. She exhibits normal muscle tone. She displays no seizure activity. Coordination normal.  No pronator drift bilateral upper extrem, able to hold both legs off bed for 5 seconds, sensation intact in all extremities, no visual field cuts, no left or right sided neglect,  no nystagmus  noted  Initially the patient was intermittently spastically shaking her right arm and her left arm, she would intermittently moan but eventually did start speaking with me and answering questions  Skin: Skin is warm and dry. No rash noted.  Psychiatric: She has a normal mood and affect.  Nursing note and vitals reviewed.    ED Treatments / Results  Labs (all labs ordered are listed, but only abnormal results are displayed) Labs Reviewed  PROTIME-INR - Abnormal; Notable for the following:       Result Value   Prothrombin Time 15.4 (*)    All other components within normal limits  CBC - Abnormal; Notable for the following:    Hemoglobin 10.9 (*)    HCT 33.9 (*)    RDW 17.3 (*)    Platelets 483 (*)    All other components within normal limits  CBG MONITORING, ED - Abnormal; Notable for the following:    Glucose-Capillary 175 (*)    All other components within normal limits  I-STAT CHEM 8, ED - Abnormal; Notable for the following:    Sodium 128 (*)    Potassium 3.0 (*)    Chloride 88 (*)    Glucose, Bld 186 (*)    All other components within normal limits  APTT  DIFFERENTIAL  ETHANOL  COMPREHENSIVE METABOLIC PANEL  RAPID URINE DRUG SCREEN, HOSP PERFORMED  URINALYSIS, ROUTINE W REFLEX MICROSCOPIC (NOT AT [facility])  I-STAT TROPOININ, ED    EKG  EKG Interpretation  Date/Time:  Tuesday June 19 2016 20:22:39 EDT Ventricular Rate:  93 PR Interval:    QRS Duration: 100 QT Interval:  390 QTC Calculation: 486 R Axis:   163 Text Interpretation:  Right and left arm electrode reversal, interpretation assumes no reversal Sinus rhythm limb lead reversal since prior ECG Confirmed by Anamari Galeas  MD-J, Greysen Devino [id number]) on 06/19/2016 8:36:18 PM       Radiology Dg Abd Acute W/chest  Result Date: 06/18/2016 CLINICAL DATA:  75 year old female with  confusion, generalized abdominal pain. Diarrhea. Sigmoid diverticulitis and pancreatic mass on recent CT. Initial encounter. EXAM: DG ABDOMEN ACUTE  W/ 1V CHEST COMPARISON:  CT Abdomen and Pelvis 06/11/2016 and earlier. FINDINGS: Semi upright AP view of the chest. Stable lung volumes. Stable cardiac size and mediastinal contours. Calcified aortic atherosclerosis. Visualized tracheal air column is within normal limits. No pneumothorax, pulmonary edema or confluent pulmonary opacity. Left-side-down lateral decubitus view of the abdomen. No pneumoperitoneum. Supine views of the abdomen demonstrate a nonobstructed bowel-gas pattern similar to that on the prior CT Abdomen and Pelvis. The urinary bladder appears distended as before. Aortoiliac calcified atherosclerosis noted. No acute osseous abnormality identified. IMPRESSION: 1.  Normal bowel gas pattern, no free air. 2.  No acute cardiopulmonary abnormality. 3.  Calcified aortic atherosclerosis. Electronically Signed   By: Genevie Ann M.D.   On: 06/18/2016 08:49   Dg Foot Complete Right  Result Date: 06/18/2016 CLINICAL DATA:  Right foot pain, no known trauma EXAM: RIGHT FOOT COMPLETE - 3+ VIEW COMPARISON:  None. FINDINGS: Three views of the right foot submitted. There is diffuse osteopenia. Small plantar spur of calcaneus. No acute fracture or subluxation. IMPRESSION: No acute fracture or subluxation. Diffuse osteopenia. Small plantar spur of calcaneus. Electronically Signed   By: Lahoma Crocker M.D.   On: 06/18/2016 08:40    Procedures .Critical Care Performed by: Dorie Rank Authorized by: Dorie Rank   Critical care provider statement:    Critical care time (minutes):  35   Critical care was necessary to treat or prevent imminent or life-threatening deterioration of the following conditions:  CNS failure or compromise and metabolic crisis   Critical care was time spent personally by me on the following activities:  Discussions with consultants, evaluation of patient's response to treatment, examination of patient, ordering and performing treatments and interventions, ordering and review of laboratory  studies, ordering and review of radiographic studies, pulse oximetry, re-evaluation of patient's condition, obtaining history from patient or surrogate and review of old charts    (including critical care time)  Medications Ordered in ED Medications  sodium chloride 0.9 % bolus 500 mL (500 mLs Intravenous New Bag/Given 06/19/16 2142)    Followed by  0.9 %  sodium chloride infusion (not administered)  potassium chloride 10 mEq in 100 mL IVPB (not administered)  LORazepam (ATIVAN) injection 1 mg (1 mg Intravenous Given by Other 06/19/16 2055)  sodium chloride 0.9 % bolus 500 mL (500 mLs Intravenous New Bag/Given 06/19/16 2142)     Initial Impression / Assessment and Plan / ED Course  I have reviewed the triage vital signs and the nursing notes.  Pertinent labs & imaging results that were available during my care of the patient were reviewed by me and considered in my medical decision making (see chart for details).  Clinical Course  Comment By Time  I walked by the bedside and patient appeared to have tonic clonic activity.  Stopped after about 30 seconds.  Decerebrate type posturing.  Teeth clenched.  Pt was unresponsive and tachycardic.  Looks like a fib.  Remained somnolent and confused afterward Dorie Rank, MD 10/17 2049  Reviewed clinic notes.  Pt has severe anxiety.  Asked PCP to increase her xanax dose.  PCP recommended psych consultation.  Pt refused.  There could be a component of benzo withdrawal Dorie Rank, MD 10/17 2107  Husband states pt takes 1 mg xanax 6 times per day.  She has not missed or tapered down her dosing recently Wille Glaser  Tomi Bamberger, MD 10/17 2149   Labs reviewed.  Pt does have hyponatremia, now 128.  Decreased from earlier in the month although similar to 4 days ago.  She does also have hypokalemia.  Etiology of this unclear.  Will need to evaluate further.  Suspect the seizure could be related to her benzodiazepines but will need to monitor the sodium.  Will consult medical  service for admission.     Final Clinical Impressions(s) / ED Diagnoses   Final diagnoses:  Hyponatremia  Seizure disorder [facility])      Dorie Rank, MD 06/19/16 2152  Corrected dictation error    Dorie Rank, MD 06/20/16 [phone]

## 2016-06-19 NOTE — Telephone Encounter (Signed)
The pt called and states she can not have EUS now because she is not able to walk and is sick.  I tried to explain the importance of having the EUS because of a pancreatic mass and we may be missing a cancer.  She states she still wants to cancel. I tried to again to explain the importance and she still declines to have the EUS.  Forwarded to Dr Ardis Hughs for review and canceled at [place]

## 2016-06-19 NOTE — Telephone Encounter (Signed)
Called case manager, she advised that she has attempted calling pt on several occassions but had no luck with the communication with the patient. She stated she will call back on Friday after the biopsy.   Spoke with patient husband and advised that they need to communicate with Est[facility] as she is the one who will make the determinations as to what services are needed/can be provided. He stated an agreement and also advised that they cancelled the biopsy for tomorrow as pt cannot ambulate.   Called [facility] back and left a detailed message to advise that pt cancelled the biopsy in case she is able/can call them sooner.

## 2016-06-20 ENCOUNTER — Observation Stay (HOSPITAL_COMMUNITY): Payer: Medicare Other

## 2016-06-20 DIAGNOSIS — K5712 Diverticulitis of small intestine without perforation or abscess without bleeding: Secondary | ICD-10-CM | POA: Diagnosis not present

## 2016-06-20 DIAGNOSIS — Z96652 Presence of left artificial knee joint: Secondary | ICD-10-CM | POA: Diagnosis not present

## 2016-06-20 DIAGNOSIS — Z9981 Dependence on supplemental oxygen: Secondary | ICD-10-CM | POA: Diagnosis not present

## 2016-06-20 DIAGNOSIS — R933 Abnormal findings on diagnostic imaging of other parts of digestive tract: Secondary | ICD-10-CM | POA: Diagnosis not present

## 2016-06-20 DIAGNOSIS — R569 Unspecified convulsions: Secondary | ICD-10-CM

## 2016-06-20 DIAGNOSIS — Z23 Encounter for immunization: Secondary | ICD-10-CM | POA: Diagnosis not present

## 2016-06-20 DIAGNOSIS — J449 Chronic obstructive pulmonary disease, unspecified: Secondary | ICD-10-CM | POA: Diagnosis not present

## 2016-06-20 DIAGNOSIS — C259 Malignant neoplasm of pancreas, unspecified: Secondary | ICD-10-CM | POA: Diagnosis not present

## 2016-06-20 DIAGNOSIS — K869 Disease of pancreas, unspecified: Secondary | ICD-10-CM

## 2016-06-20 DIAGNOSIS — G40909 Epilepsy, unspecified, not intractable, without status epilepticus: Secondary | ICD-10-CM | POA: Diagnosis present

## 2016-06-20 DIAGNOSIS — I10 Essential (primary) hypertension: Secondary | ICD-10-CM | POA: Diagnosis not present

## 2016-06-20 DIAGNOSIS — I959 Hypotension, unspecified: Secondary | ICD-10-CM | POA: Diagnosis not present

## 2016-06-20 DIAGNOSIS — K5713 Diverticulitis of small intestine without perforation or abscess with bleeding: Secondary | ICD-10-CM

## 2016-06-20 DIAGNOSIS — I480 Paroxysmal atrial fibrillation: Secondary | ICD-10-CM | POA: Diagnosis not present

## 2016-06-20 DIAGNOSIS — F410 Panic disorder [episodic paroxysmal anxiety] without agoraphobia: Secondary | ICD-10-CM | POA: Diagnosis not present

## 2016-06-20 DIAGNOSIS — I110 Hypertensive heart disease with heart failure: Secondary | ICD-10-CM | POA: Diagnosis not present

## 2016-06-20 DIAGNOSIS — I2510 Atherosclerotic heart disease of native coronary artery without angina pectoris: Secondary | ICD-10-CM | POA: Diagnosis not present

## 2016-06-20 DIAGNOSIS — E119 Type 2 diabetes mellitus without complications: Secondary | ICD-10-CM | POA: Diagnosis not present

## 2016-06-20 DIAGNOSIS — E871 Hypo-osmolality and hyponatremia: Secondary | ICD-10-CM | POA: Diagnosis not present

## 2016-06-20 DIAGNOSIS — J438 Other emphysema: Secondary | ICD-10-CM

## 2016-06-20 DIAGNOSIS — R4182 Altered mental status, unspecified: Secondary | ICD-10-CM

## 2016-06-20 DIAGNOSIS — B370 Candidal stomatitis: Secondary | ICD-10-CM | POA: Diagnosis not present

## 2016-06-20 DIAGNOSIS — D649 Anemia, unspecified: Secondary | ICD-10-CM | POA: Diagnosis not present

## 2016-06-20 DIAGNOSIS — R269 Unspecified abnormalities of gait and mobility: Secondary | ICD-10-CM | POA: Diagnosis not present

## 2016-06-20 DIAGNOSIS — Z86711 Personal history of pulmonary embolism: Secondary | ICD-10-CM | POA: Diagnosis not present

## 2016-06-20 DIAGNOSIS — C786 Secondary malignant neoplasm of retroperitoneum and peritoneum: Secondary | ICD-10-CM | POA: Diagnosis not present

## 2016-06-20 DIAGNOSIS — C7972 Secondary malignant neoplasm of left adrenal gland: Secondary | ICD-10-CM | POA: Diagnosis not present

## 2016-06-20 DIAGNOSIS — K219 Gastro-esophageal reflux disease without esophagitis: Secondary | ICD-10-CM | POA: Diagnosis not present

## 2016-06-20 DIAGNOSIS — Z86718 Personal history of other venous thrombosis and embolism: Secondary | ICD-10-CM | POA: Diagnosis not present

## 2016-06-20 DIAGNOSIS — E1140 Type 2 diabetes mellitus with diabetic neuropathy, unspecified: Secondary | ICD-10-CM | POA: Diagnosis not present

## 2016-06-20 DIAGNOSIS — E039 Hypothyroidism, unspecified: Secondary | ICD-10-CM

## 2016-06-20 DIAGNOSIS — I5022 Chronic systolic (congestive) heart failure: Secondary | ICD-10-CM | POA: Diagnosis not present

## 2016-06-20 DIAGNOSIS — R404 Transient alteration of awareness: Secondary | ICD-10-CM | POA: Diagnosis not present

## 2016-06-20 DIAGNOSIS — E785 Hyperlipidemia, unspecified: Secondary | ICD-10-CM | POA: Diagnosis not present

## 2016-06-20 DIAGNOSIS — C7889 Secondary malignant neoplasm of other digestive organs: Secondary | ICD-10-CM | POA: Diagnosis not present

## 2016-06-20 DIAGNOSIS — K750 Abscess of liver: Secondary | ICD-10-CM | POA: Diagnosis not present

## 2016-06-20 DIAGNOSIS — E0840 Diabetes mellitus due to underlying condition with diabetic neuropathy, unspecified: Secondary | ICD-10-CM

## 2016-06-20 DIAGNOSIS — F418 Other specified anxiety disorders: Secondary | ICD-10-CM

## 2016-06-20 DIAGNOSIS — M79673 Pain in unspecified foot: Secondary | ICD-10-CM

## 2016-06-20 DIAGNOSIS — Z7901 Long term (current) use of anticoagulants: Secondary | ICD-10-CM | POA: Diagnosis not present

## 2016-06-20 DIAGNOSIS — G9340 Encephalopathy, unspecified: Secondary | ICD-10-CM | POA: Diagnosis not present

## 2016-06-20 LAB — BASIC METABOLIC PANEL
ANION GAP: 11 (ref 5–15)
BUN: <5 mg/dL — ABNORMAL LOW (ref 6–20)
CHLORIDE: 99 mmol/L — AB (ref 101–111)
CO2: 24 mmol/L (ref 22–32)
Calcium: 8.9 mg/dL (ref 8.9–10.3)
Creatinine, Ser: 0.66 mg/dL (ref 0.44–1.00)
GFR calc non Af Amer: >60 mL/min (ref >60)
GLUCOSE: 140 mg/dL — AB (ref 65–99)
Potassium: 3.4 mmol/L — ABNORMAL LOW (ref 3.5–5.1)
Sodium: 134 mmol/L — ABNORMAL LOW (ref 135–145)

## 2016-06-20 LAB — GLUCOSE, CAPILLARY
Glucose-Capillary: 108 mg/dL — ABNORMAL HIGH (ref 65–99)
Glucose-Capillary: 119 mg/dL — ABNORMAL HIGH (ref 65–99)

## 2016-06-20 LAB — CBG MONITORING, ED
GLUCOSE-CAPILLARY: 158 mg/dL — AB (ref 65–99)
GLUCOSE-CAPILLARY: 122 mg/dL — AB (ref 65–99)
Glucose-Capillary: 119 mg/dL — ABNORMAL HIGH (ref 65–99)

## 2016-06-20 LAB — PROTIME-INR
INR: 1.17
PROTHROMBIN TIME: 15 s (ref 11.4–15.2)

## 2016-06-20 LAB — HEPARIN LEVEL (UNFRACTIONATED)
HEPARIN UNFRACTIONATED: 0.2 [IU]/mL — AB (ref 0.30–0.70)
Heparin Unfractionated: 0.2 IU/mL — ABNORMAL LOW (ref 0.30–0.70)

## 2016-06-20 LAB — MAGNESIUM: MAGNESIUM: 1.1 mg/dL — AB (ref 1.7–2.4)

## 2016-06-20 MED ORDER — SODIUM CHLORIDE 0.9 % IV SOLN
200.0000 mg | Freq: Once | INTRAVENOUS | Status: AC
  Administered 2016-06-20: 200 mg via INTRAVENOUS
  Filled 2016-06-20: qty 20

## 2016-06-20 MED ORDER — LORAZEPAM 2 MG/ML IJ SOLN
1.0000 mg | Freq: Once | INTRAMUSCULAR | Status: AC
  Administered 2016-06-20: 1 mg via INTRAVENOUS

## 2016-06-20 MED ORDER — SODIUM CHLORIDE 0.9% FLUSH
10.0000 mL | INTRAVENOUS | Status: DC | PRN
Start: 2016-06-20 — End: 2016-06-23

## 2016-06-20 MED ORDER — FENOFIBRATE 160 MG PO TABS: 160.0000 mg | ORAL_TABLET | Freq: Every day | ORAL | Status: DC

## 2016-06-20 MED ORDER — LORAZEPAM 2 MG/ML IJ SOLN
INTRAMUSCULAR | Status: AC
  Filled 2016-06-20: qty 1

## 2016-06-20 MED ORDER — ONDANSETRON HCL 4 MG/2ML IJ SOLN: 4.0000 mg | Freq: Four times a day (QID) | INTRAMUSCULAR | Status: DC | PRN

## 2016-06-20 MED ORDER — MAGNESIUM SULFATE 4 GM/100ML IV SOLN
4.0000 g | Freq: Once | INTRAVENOUS | Status: AC
  Administered 2016-06-20: 4 g via INTRAVENOUS
  Filled 2016-06-20: qty 100

## 2016-06-20 MED ORDER — METOPROLOL SUCCINATE ER 25 MG PO TB24: 25.0000 mg | ORAL_TABLET | Freq: Every day | ORAL | Status: DC

## 2016-06-20 MED ORDER — ACETAMINOPHEN 650 MG RE SUPP: 650.0000 mg | Freq: Four times a day (QID) | RECTAL | Status: DC | PRN

## 2016-06-20 MED ORDER — INSULIN ASPART 100 UNIT/ML ~~LOC~~ SOLN
0.0000 [IU] | SUBCUTANEOUS | Status: DC
  Administered 2016-06-21: 1 [IU] via SUBCUTANEOUS
  Administered 2016-06-21 (×2): 2 [IU] via SUBCUTANEOUS

## 2016-06-20 MED ORDER — LEVOTHYROXINE SODIUM 50 MCG PO TABS: 50.0000 ug | ORAL_TABLET | Freq: Every day | ORAL | Status: DC

## 2016-06-20 MED ORDER — LEVETIRACETAM 500 MG/5ML IV SOLN
1000.0000 mg | Freq: Two times a day (BID) | INTRAVENOUS | Status: DC
  Administered 2016-06-20: 1000 mg via INTRAVENOUS
  Filled 2016-06-20 (×3): qty 10

## 2016-06-20 MED ORDER — FOLIC ACID 1 MG PO TABS
1.0000 mg | ORAL_TABLET | Freq: Every day | ORAL | Status: DC
  Filled 2016-06-20: qty 1

## 2016-06-20 MED ORDER — LORAZEPAM 2 MG/ML IJ SOLN
0.5000 mg | Freq: Four times a day (QID) | INTRAMUSCULAR | Status: DC
  Administered 2016-06-20 – 2016-06-23 (×13): 0.5 mg via INTRAVENOUS
  Filled 2016-06-20 (×13): qty 1

## 2016-06-20 MED ORDER — METOPROLOL SUCCINATE ER 25 MG PO TB24
25.0000 mg | ORAL_TABLET | Freq: Every day | ORAL | Status: DC
  Administered 2016-06-21 – 2016-06-23 (×3): 25 mg via ORAL
  Filled 2016-06-20 (×3): qty 1

## 2016-06-20 MED ORDER — ACETAMINOPHEN 325 MG PO TABS
650.0000 mg | ORAL_TABLET | Freq: Four times a day (QID) | ORAL | Status: DC | PRN
  Administered 2016-06-23: 650 mg via ORAL
  Filled 2016-06-20 (×2): qty 2

## 2016-06-20 MED ORDER — SERTRALINE HCL 25 MG PO TABS
25.0000 mg | ORAL_TABLET | Freq: Every day | ORAL | Status: DC
  Administered 2016-06-21 – 2016-06-22 (×2): 25 mg via ORAL
  Filled 2016-06-20 (×2): qty 1

## 2016-06-20 MED ORDER — ALBUTEROL SULFATE HFA 108 (90 BASE) MCG/ACT IN AERS: 2.0000 | INHALATION_SPRAY | Freq: Four times a day (QID) | RESPIRATORY_TRACT | Status: DC | PRN

## 2016-06-20 MED ORDER — POTASSIUM CHLORIDE 10 MEQ/100ML IV SOLN
10.0000 meq | INTRAVENOUS | Status: AC
Start: 2016-06-20 — End: 2016-06-20
  Administered 2016-06-20 (×2): 10 meq via INTRAVENOUS
  Filled 2016-06-20 (×2): qty 100

## 2016-06-20 MED ORDER — SODIUM CHLORIDE 0.9 % IV SOLN: 20.0000 mg/kg | Freq: Once | INTRAVENOUS | Status: DC

## 2016-06-20 MED ORDER — FLUTICASONE FUROATE-VILANTEROL 100-25 MCG/INH IN AEPB
1.0000 | INHALATION_SPRAY | Freq: Every day | RESPIRATORY_TRACT | Status: DC
  Administered 2016-06-21 – 2016-06-23 (×2): 1 via RESPIRATORY_TRACT
  Filled 2016-06-20: qty 28

## 2016-06-20 MED ORDER — LORAZEPAM 2 MG/ML IJ SOLN
1.0000 mg | Freq: Once | INTRAMUSCULAR | Status: AC
  Administered 2016-06-20: 1 mg via INTRAVENOUS
  Filled 2016-06-20: qty 1

## 2016-06-20 MED ORDER — FAMOTIDINE IN NACL 20-0.9 MG/50ML-% IV SOLN
20.0000 mg | Freq: Two times a day (BID) | INTRAVENOUS | Status: DC
  Administered 2016-06-20 – 2016-06-23 (×7): 20 mg via INTRAVENOUS
  Filled 2016-06-20 (×7): qty 50

## 2016-06-20 MED ORDER — LORAZEPAM 2 MG/ML IJ SOLN
1.0000 mg | Freq: Four times a day (QID) | INTRAMUSCULAR | Status: DC | PRN
  Filled 2016-06-20: qty 1

## 2016-06-20 MED ORDER — POTASSIUM CHLORIDE CRYS ER 20 MEQ PO TBCR
20.0000 meq | EXTENDED_RELEASE_TABLET | Freq: Every day | ORAL | Status: DC
  Filled 2016-06-20: qty 1

## 2016-06-20 MED ORDER — LORAZEPAM 2 MG/ML IJ SOLN
1.0000 mg | INTRAMUSCULAR | Status: DC | PRN
  Filled 2016-06-20: qty 1

## 2016-06-20 MED ORDER — SODIUM CHLORIDE 0.9% FLUSH
10.0000 mL | Freq: Two times a day (BID) | INTRAVENOUS | Status: DC
  Administered 2016-06-20: 20 mL
  Administered 2016-06-20 – 2016-06-21 (×3): 10 mL
  Administered 2016-06-22: 20 mL
  Administered 2016-06-23: 10 mL

## 2016-06-20 MED ORDER — POTASSIUM CHLORIDE CRYS ER 20 MEQ PO TBCR: 40.0000 meq | EXTENDED_RELEASE_TABLET | Freq: Once | ORAL | Status: DC

## 2016-06-20 MED ORDER — LACOSAMIDE 200 MG/20ML IV SOLN
100.0000 mg | Freq: Two times a day (BID) | INTRAVENOUS | Status: DC
  Administered 2016-06-21 (×2): 100 mg via INTRAVENOUS
  Filled 2016-06-20 (×5): qty 10

## 2016-06-20 MED ORDER — LORAZEPAM 2 MG/ML IJ SOLN
2.0000 mg | Freq: Once | INTRAMUSCULAR | Status: AC
  Administered 2016-06-20: 2 mg via INTRAVENOUS

## 2016-06-20 MED ORDER — HEPARIN (PORCINE) IN NACL 100-0.45 UNIT/ML-% IJ SOLN
1000.0000 [IU]/h | INTRAMUSCULAR | Status: DC
  Administered 2016-06-20 (×2): 900 [IU]/h via INTRAVENOUS
  Filled 2016-06-20: qty 250

## 2016-06-20 MED ORDER — ALBUTEROL SULFATE (2.5 MG/3ML) 0.083% IN NEBU: 2.5000 mg | INHALATION_SOLUTION | Freq: Four times a day (QID) | RESPIRATORY_TRACT | Status: DC | PRN

## 2016-06-20 MED ORDER — POTASSIUM CHLORIDE 10 MEQ/100ML IV SOLN: 10.0000 meq | INTRAVENOUS | Status: AC

## 2016-06-20 MED ORDER — PANTOPRAZOLE SODIUM 40 MG PO TBEC
80.0000 mg | DELAYED_RELEASE_TABLET | Freq: Every day | ORAL | Status: DC
  Filled 2016-06-20: qty 2

## 2016-06-20 MED ORDER — SODIUM CHLORIDE 0.9% FLUSH
3.0000 mL | Freq: Two times a day (BID) | INTRAVENOUS | Status: DC
  Administered 2016-06-20 – 2016-06-23 (×4): 3 mL via INTRAVENOUS

## 2016-06-20 MED ORDER — SENNOSIDES-DOCUSATE SODIUM 8.6-50 MG PO TABS
1.0000 | ORAL_TABLET | Freq: Every evening | ORAL | Status: DC | PRN
  Administered 2016-06-23: 1 via ORAL
  Filled 2016-06-20: qty 1

## 2016-06-20 MED ORDER — ALPRAZOLAM 0.25 MG PO TABS
1.0000 mg | ORAL_TABLET | Freq: Every day | ORAL | Status: DC
  Filled 2016-06-20: qty 4

## 2016-06-20 MED ORDER — INSULIN ASPART 100 UNIT/ML ~~LOC~~ SOLN: 0.0000 [IU] | Freq: Three times a day (TID) | SUBCUTANEOUS | Status: DC

## 2016-06-20 MED ORDER — SODIUM CHLORIDE 0.9 % IV BOLUS (SEPSIS)
500.0000 mL | Freq: Once | INTRAVENOUS | Status: AC
  Administered 2016-06-20: 500 mL via INTRAVENOUS

## 2016-06-20 MED ORDER — HYDROCODONE-ACETAMINOPHEN 5-325 MG PO TABS: 0.5000 | ORAL_TABLET | ORAL | Status: DC | PRN

## 2016-06-20 MED ORDER — SODIUM CHLORIDE 0.9 % IV SOLN
500.0000 mg | Freq: Two times a day (BID) | INTRAVENOUS | Status: DC
  Filled 2016-06-20: qty 5

## 2016-06-20 MED ORDER — SODIUM CHLORIDE 0.9 % IV SOLN
1500.0000 mg | INTRAVENOUS | Status: AC
  Administered 2016-06-20: 1500 mg via INTRAVENOUS
  Filled 2016-06-20: qty 15

## 2016-06-20 MED ORDER — SODIUM CHLORIDE 0.9 % IV SOLN
INTRAVENOUS | Status: DC
  Administered 2016-06-20: 13:00:00 via INTRAVENOUS

## 2016-06-20 MED ORDER — SIMVASTATIN 20 MG PO TABS
20.0000 mg | ORAL_TABLET | Freq: Every evening | ORAL | Status: DC
  Administered 2016-06-20 – 2016-06-22 (×3): 20 mg via ORAL
  Filled 2016-06-20 (×3): qty 1

## 2016-06-20 MED ORDER — LEVOTHYROXINE SODIUM 100 MCG IV SOLR
25.0000 ug | Freq: Every day | INTRAVENOUS | Status: DC
  Administered 2016-06-20 – 2016-06-23 (×4): 25 ug via INTRAVENOUS
  Filled 2016-06-20 (×4): qty 5

## 2016-06-20 MED ORDER — LISINOPRIL 10 MG PO TABS: 10.0000 mg | ORAL_TABLET | Freq: Every day | ORAL | Status: DC

## 2016-06-20 MED ORDER — ONDANSETRON HCL 4 MG PO TABS
4.0000 mg | ORAL_TABLET | Freq: Four times a day (QID) | ORAL | Status: DC | PRN
  Administered 2016-06-23: 4 mg via ORAL
  Filled 2016-06-20: qty 1

## 2016-06-20 NOTE — Progress Notes (Signed)
LTM EEG started, pt in ED and RN will call when she is ready to move to a room.

## 2016-06-20 NOTE — ED Notes (Signed)
IV Team at the bedside completing PICC line

## 2016-06-20 NOTE — ED Notes (Signed)
Attempted Second Placement of IV x1. Family requested IV Team

## 2016-06-20 NOTE — Progress Notes (Signed)
Peripherally Inserted Central Catheter/Midline Placement  The IV Nurse has discussed with the patient and/or persons authorized to consent for the patient, the purpose of this procedure and the potential benefits and risks involved with this procedure.  The benefits include less needle sticks, lab draws from the catheter, and the patient may be discharged home with the catheter. Risks include, but not limited to, infection, bleeding, blood clot (thrombus formation), and puncture of an artery; nerve damage and irregular heartbeat and possibility to perform a PICC exchange if needed/ordered by physician.  Alternatives to this procedure were also discussed.  [employer] PICC patient education guide, fact sheet on infection prevention and patient information card has been provided to patient /or left at bedside.    PICC/Midline Placement Documentation     Consent signed by husband at bedside   Synthia Innocent 06/20/2016, 3:33 PM

## 2016-06-20 NOTE — ED Notes (Signed)
Changed pt's brief with the assistance of [place], EMT-Tech.

## 2016-06-20 NOTE — ED Notes (Signed)
Pt given 1 mg of Ativan per MD Jaynee Eagles order

## 2016-06-20 NOTE — ED Notes (Signed)
Paged Admitting MD about patient's seizure.

## 2016-06-20 NOTE — Progress Notes (Signed)
EEG completed; results pending.    

## 2016-06-20 NOTE — ED Notes (Signed)
Phlebotomy at the bedside  

## 2016-06-20 NOTE — ED Notes (Signed)
IV Team at the bedside. 

## 2016-06-20 NOTE — ED Notes (Signed)
Pt in MRI. Heparin to start upon return to ER

## 2016-06-20 NOTE — ED Notes (Signed)
Changed pt's brief and gave new warm blankets.

## 2016-06-20 NOTE — ED Notes (Signed)
Waiting for Keppra from Pharmacy

## 2016-06-20 NOTE — Procedures (Signed)
ELECTROENCEPHALOGRAM REPORT  Date of Study: 06/20/2016  Patient's Name: Sarah Mcguire MRN: [id number] Date of Birth: [birth year]  Referring Provider: Sarina Ill, MD  Clinical History: 75 y.o. 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 presenting with altered mental status  Medications:  LORazepam (ATIVAN) 2 MG/ML injection  L1 0.9 % sodium chloride infusion   0.9 % sodium chloride infusion  L2 acetaminophen (TYLENOL) suppository 650 mg  L2 acetaminophen (TYLENOL) tablet 650 mg   albuterol (PROVENTIL HFA;VENTOLIN HFA) 108 (90 Base) MCG/ACT inhaler 2 puff   ALPRAZolam (XANAX) tablet 1 mg   fenofibrate tablet 160 mg   fluticasone furoate-vilanterol (BREO ELLIPTA) 100-25 MCG/INH 1 puff   folic acid (FOLVITE) tablet 1 mg   fosPHENYtoin (CEREBYX) 1,496 mg PE in sodium chloride 0.9 % 50 mL IVPB   heparin ADULT infusion 100 units/mL (25000 units/252mL sodium chloride 0.45%)   HYDROcodone-acetaminophen (NORCO/VICODIN) 5-325 MG per tablet 0.5 tablet   insulin aspart (novoLOG) injection 0-15 Units   levothyroxine (SYNTHROID, LEVOTHROID) tablet 50 mcg   lisinopril (PRINIVIL,ZESTRIL) tablet 10 mg   LORazepam (ATIVAN) injection 1 mg   metoprolol succinate (TOPROL-XL) 24 hr tablet 25 mg  L3 ondansetron (ZOFRAN) injection 4 mg  L3 ondansetron (ZOFRAN) tablet 4 mg   pantoprazole (PROTONIX) EC tablet 80 mg   potassium chloride 10 mEq in 100 mL IVPB   potassium chloride SA (K-DUR,KLOR-CON) CR tablet 20 mEq   potassium chloride SA (K-DUR,KLOR-CON) CR tablet 40 mEq   senna-docusate (Senokot-S) tablet 1 tablet   sertraline (ZOLOFT) tablet 25 mg   simvastatin (ZOCOR) tablet 20 mg   sodium chloride flush (NS) 0.9 % injection 3 mL    Technical Summary: A multichannel digital EEG recording measured by the international 10-20 system with electrodes applied with paste and impedances below 5000 ohms performed in  our laboratory with EKG monitoring in an awake patient.  Hyperventilation and photic stimulation were not performed.  The digital EEG was referentially recorded, reformatted, and digitally filtered in a variety of bipolar and referential montages for optimal display.    Description: The patient is awake during the recording.  During maximal wakefulness, there is a symmetric, medium voltage 10 Hz posterior dominant rhythm that attenuates with eye opening.  The record is symmetric.  Stage 2 sleep is not seen.    At 00:14:20, there is onset of generalized 2 Hz slowing that evolves to generalized sharp waves occurring one per second and then evolves to generalized 3 Hz sharp waves with possible bi-frontal preponderance, before ending at 00:39:00 followed by diffuse suppression of the background.  Clinically, at 00:14:30, patient is unresponsive, looking around the room with arms flexed and shaking.  At 00:37:00, her eyes are rolled back with grimace and chewing automatisms, right arm flexed and shaking and head and gaze to the left, before ending at 00:39:00.  She was given 1 mg Ativan at 00:37:12 and at 00:41:52  EKG lead was unremarkable.  Impression: This awake EEG is abnormal due to non-lateralizing seizure.   Neuro-hospitalist notified.   Metta Clines, DO

## 2016-06-20 NOTE — ED Notes (Addendum)
Neurology MD returned page. Made aware of patient's seizure. Stated to get EEG STAT

## 2016-06-20 NOTE — Progress Notes (Signed)
Progress note    Chief Complaint: Altered mental status and seizure-like activity.  Interval history 06/20/2016: At bedside today with patient when she became altered and confused and subsequently had seizure that was comprised of tonic activity with eyes open and rolled back was given 31m ativan with resolution and loaded with 15085mkeppra and now 1g bid. Discussed with husband. Patient was alert and oriented prior to event and then sleepy but arousable and following commands afterwards. Will keep her on continuous eeg.  HPI: Sarah Mcguire an [age].o. female history diabetes mellitus, pulmonary embolus, DVT, atrial fibrillation, coronary artery disease, hypertension and hyperlipidemia, as well as history of anxiety, brought to the emergency room following a change in mental status as well as new onset of seizure-like activity. Patient has no history of seizure disorder. She she had an episode of unable to speak with eyes deviated upwards and shaking of extremities prior to EMS arriving. She had a similar spell in the emergency room, which was followed by family unresponsive and subsequently confused on regaining consciousness. Patient is on a benzodiazepine medication and reportedly has not missed any doses of this medication. Patient has also been on Cipro 500 mg twice a day. She was afebrile. Laboratory studies showed hyponatremia with sodium 128. Blood sugar was 175. CT scan of her head is pending.  Past Medical History:  Diagnosis Date  . Anemia   . Anxiety    SEVERE  . Arthritis    "probably in my back" (05/08/2016)  . Atrial fibrillation with RVR ([facility]2014  . Chronic lower back pain   . Coronary artery disease   . Diverticulitis   . DVT (deep venous thrombosis) ([facility]  . Emphysema of lung ([facility]  . GERD (gastroesophageal reflux disease)   . High cholesterol   . Hyperglycemia   . Hyperlipidemia   . Hypertension   . Impaired mobility    ambulates with walker.  . Leukocytosis    . Multiple drug allergies   . Neuropathy ([facility] [place]  . On home oxygen therapy    "2L; 24/7 prn" (05/08/2016)  . PE (pulmonary embolism)    Bilateral PE, stringy saddle embolus with predominant proximal lower lobe pulmonary artery involvement 06/26/12  . Type II diabetes mellitus ([facility]    Past Surgical History:  Procedure Laterality Date  . CARDIAC CATHETERIZATION N/A 05/30/2016   Procedure: Left Heart Cath and Coronary Angiography;  Surgeon: Peter M JoMartiniqueMD;  [facility];  Service: Cardiovascular;  Laterality: N/A;  . CATARACT EXTRACTION W/ INTRAOCULAR LENS  IMPLANT, BILATERAL Bilateral 08/2013  . JOINT REPLACEMENT    . TONSILLECTOMY    . TOTAL KNEE ARTHROPLASTY Left 11/24/2012   Procedure: TOTAL KNEE ARTHROPLASTY;  Surgeon: FrKerin SalenMD;  [facility] Service: Orthopedics;  Laterality: Left;  . TYMPANOSTOMY TUBE PLACEMENT Right early 2000s    Family History  Problem Relation Age of Onset  . Diabetes Mother   . Hypertension Mother   . Heart disease Father     MI age 76's . Diabetes Brother   . Hypertension Brother    Social History:  reports that she quit smoking about 3 months ago. Her smoking use included Cigarettes. She has a 30.00 pack-year smoking history. She has never used smokeless tobacco. She reports that she does not drink alcohol or use drugs.  Allergies:  Allergies  Allergen Reactions  . Morphine And Related Other (See Comments)  Usually drops her blood pressure  . Cymbalta [Duloxetine Hcl] Other (See Comments)    Nausea, headache and diarrhea   . Amoxicillin Other (See Comments)    Unknown Has patient had a PCN reaction causing immediate rash, facial/tongue/throat swelling, SOB or lightheadedness with hypotension:unsure Has patient had a PCN reaction causing severe rash involving mucus membranes or skin necrosis:unsure Has patient had a PCN reaction that required hospitalization:unsure Has patient had a PCN reaction occurring  within the last 10 years:unsure If all of the above answers are "NO", then may proceed with Cephalosporin use.     Marland Kitchen Amoxicillin-Pot Clavulanate Other (See Comments)    Has patient had a PCN reaction causing immediate rash, facial/tongue/throat swelling, SOB or lightheadedness with hypotension:unsure Has patient had a PCN reaction causing severe rash involving mucus membranes or skin necrosis:unsure Has patient had a PCN reaction that required hospitalization:unsure Has patient had a PCN reaction occurring within the last 10 years:unsure If all of the above answers are "NO", then may proceed with Cephalosporin use.    . Citalopram Hydrobromide Itching       . Clonazepam Itching  . Macrobid [Nitrofurantoin Macrocrystal] Nausea And Vomiting  . Penicillins Other (See Comments)    Unknown Has patient had a PCN reaction causing immediate rash, facial/tongue/throat swelling, SOB or lightheadedness with hypotension:unsure Has patient had a PCN reaction causing severe rash involving mucus membranes or skin necrosis:unsure Has patient had a PCN reaction that required hospitalization:unsure Has patient had a PCN reaction occurring within the last 10 years:unsure If all of the above answers are "NO", then may proceed with Cephalosporin use.      . Pyridium [Phenazopyridine Hcl] Other (See Comments)    headache  . Sertraline Hcl Anxiety  . Wellbutrin [Bupropion] Itching, Rash and Other (See Comments)    Itching, rash, hyper   . Zonegran Other (See Comments)    Unknown   . Zonisamide Other (See Comments)    Unknown     ROS: History obtained from spouse and the patient.  General ROS: negative for - chills, fatigue, fever, night sweats, weight gain or weight loss Psychological ROS: negative for - behavioral disorder, hallucinations, memory difficulties, mood swings or suicidal ideation Ophthalmic ROS: negative for - blurry vision, double vision, eye pain or loss of vision ENT ROS:  negative for - epistaxis, nasal discharge, oral lesions, sore throat, tinnitus or vertigo Allergy and Immunology ROS: negative for - hives or itchy/watery eyes Hematological and Lymphatic ROS: negative for - bleeding problems, bruising or swollen lymph nodes Endocrine ROS: negative for - galactorrhea, hair pattern changes, polydipsia/polyuria or temperature intolerance Respiratory ROS: negative for - cough, hemoptysis, shortness of breath or wheezing Cardiovascular ROS: negative for - chest pain, dyspnea on exertion, edema or irregular heartbeat Gastrointestinal ROS: negative for - abdominal pain, diarrhea, hematemesis, nausea/vomiting or stool incontinence Genito-Urinary ROS: negative for - dysuria, hematuria, incontinence or urinary frequency/urgency Musculoskeletal ROS: Painful cramping of her feet over the past 2 days Neurological ROS: as noted in HPI Dermatological ROS: negative for rash and skin lesion changes  Physical Examination: Blood pressure (!) 86/64, pulse 61, temperature 97.9 F (36.6 C), resp. rate 20, SpO2 98 %.  HEENT-  Normocephalic, no lesions, without obvious abnormality.  Normal external eye and conjunctiva.  Normal TM's bilaterally.  Normal auditory canals and external ears. Normal external nose, mucus membranes and septum.  Normal pharynx. Neck supple with no masses, nodes, nodules or enlargement. Cardiovascular - tachycardic, regular rhythm, normal S1 and S2, no  murmur or gallop. Lungs - chest clear, no wheezing, rales, normal symmetric air entry Abdomen - soft, non-tender; bowel sounds normal; no masses,  no organomegaly Extremities - no joint deformities, effusion, or inflammation  Neurologic Examination: Mental Status: Alert, oriented, no acute distress.  Speech fluent without evidence of aphasia. Able to follow commands without difficulty. Cranial Nerves: II-Visual fields were normal. III/IV/VI-Pupils were equal and reacted normally to light. Extraocular  movements were full and conjugate.    V/VII-no facial numbness and no facial weakness. VIII-normal. X-normal speech and symmetrical palatal movement. XI: trapezius strength/neck flexion strength normal bilaterally XII-midline tongue extension with normal strength. Motor: 5/5 bilaterally with normal tone and bulk Sensory: Normal throughout. Deep Tendon Reflexes: 1+ and symmetric. Plantars: Mute bilaterally Cerebellar: Normal finger-to-nose testing. Carotid auscultation: Normal  Results for orders placed or performed during the hospital encounter of 06/19/16 (from the past 48 hour(s))  CBG monitoring, ED     Status: Abnormal   Collection Time: 06/19/16  8:07 PM  Result Value Ref Range   Glucose-Capillary 175 (H) 65 - 99 mg/dL  Protime-INR     Status: Abnormal   Collection Time: 06/19/16  8:59 PM  Result Value Ref Range   Prothrombin Time 15.4 (H) 11.4 - 15.2 seconds   INR 1.21   APTT     Status: None   Collection Time: 06/19/16  8:59 PM  Result Value Ref Range   aPTT 35 24 - 36 seconds  CBC     Status: Abnormal   Collection Time: 06/19/16  8:59 PM  Result Value Ref Range   WBC 10.3 4.0 - 10.5 K/uL   RBC 4.02 3.87 - 5.11 MIL/uL   Hemoglobin 10.9 (L) 12.0 - 15.0 g/dL   HCT 33.9 (L) 36.0 - 46.0 %   MCV 84.3 78.0 - 100.0 fL   MCH 27.1 26.0 - 34.0 pg   MCHC 32.2 30.0 - 36.0 g/dL   RDW 17.3 (H) 11.5 - 15.5 %   Platelets 483 (H) 150 - 400 K/uL  Differential     Status: None   Collection Time: 06/19/16  8:59 PM  Result Value Ref Range   Neutrophils Relative % 74 %   Neutro Abs 7.7 1.7 - 7.7 K/uL   Lymphocytes Relative 16 %   Lymphs Abs 1.6 0.7 - 4.0 K/uL   Monocytes Relative 9 %   Monocytes Absolute 0.9 0.1 - 1.0 K/uL   Eosinophils Relative 1 %   Eosinophils Absolute 0.1 0.0 - 0.7 K/uL   Basophils Relative 0 %   Basophils Absolute 0.0 0.0 - 0.1 K/uL  Comprehensive metabolic panel     Status: Abnormal   Collection Time: 06/19/16  8:59 PM  Result Value Ref Range   Sodium  129 (L) 135 - 145 mmol/L   Potassium 3.0 (L) 3.5 - 5.1 mmol/L   Chloride 88 (L) 101 - 111 mmol/L   CO2 22 22 - 32 mmol/L   Glucose, Bld 206 (H) 65 - 99 mg/dL   BUN 6 6 - 20 mg/dL   Creatinine, Ser 0.93 0.44 - 1.00 mg/dL   Calcium 9.9 8.9 - 10.3 mg/dL   Total Protein 6.7 6.5 - 8.1 g/dL   Albumin 3.9 3.5 - 5.0 g/dL   AST 56 (H) 15 - 41 U/L   ALT 31 14 - 54 U/L   Alkaline Phosphatase 46 38 - 126 U/L   Total Bilirubin 0.6 0.3 - 1.2 mg/dL   GFR calc non Af Amer 58 (L) >60  mL/min   GFR calc Af Amer >60 >60 mL/min    Comment: (NOTE) The eGFR has been calculated using the CKD EPI equation. This calculation has not been validated in all clinical situations. eGFR's persistently <60 mL/min signify possible Chronic Kidney Disease.    Anion gap 19 (H) 5 - 15  I-stat troponin, ED (not at [facility], [facility])     Status: None   Collection Time: 06/19/16  9:13 PM  Result Value Ref Range   Troponin i, poc 0.01 0.00 - 0.08 ng/mL   Comment 3            Comment: Due to the release kinetics of cTnI, a negative result within the first hours of the onset of symptoms does not rule out myocardial infarction with certainty. If myocardial infarction is still suspected, repeat the test at appropriate intervals.   I-Stat Chem 8, ED  (not at [facility], [facility])     Status: Abnormal   Collection Time: 06/19/16  9:15 PM  Result Value Ref Range   Sodium 128 (L) 135 - 145 mmol/L   Potassium 3.0 (L) 3.5 - 5.1 mmol/L   Chloride 88 (L) 101 - 111 mmol/L   BUN 6 6 - 20 mg/dL   Creatinine, Ser 0.70 0.44 - 1.00 mg/dL   Glucose, Bld 186 (H) 65 - 99 mg/dL   Calcium, Ion 1.21 1.15 - 1.40 mmol/L   TCO2 23 0 - 100 mmol/L   Hemoglobin 13.3 12.0 - 15.0 g/dL   HCT 39.0 36.0 - 46.0 %  Ethanol     Status: None   Collection Time: 06/19/16  9:24 PM  Result Value Ref Range   Alcohol, Ethyl (B) <5 <5 mg/dL    Comment:        LOWEST DETECTABLE LIMIT FOR SERUM ALCOHOL IS 5 mg/dL FOR MEDICAL PURPOSES ONLY   Urine rapid drug screen  (hosp performed)not at [facility]     Status: Abnormal   Collection Time: 06/19/16 11:06 PM  Result Value Ref Range   Opiates POSITIVE (A) NONE DETECTED   Cocaine NONE DETECTED NONE DETECTED   Benzodiazepines POSITIVE (A) NONE DETECTED   Amphetamines NONE DETECTED NONE DETECTED   Tetrahydrocannabinol NONE DETECTED NONE DETECTED   Barbiturates NONE DETECTED NONE DETECTED    Comment:        DRUG SCREEN FOR MEDICAL PURPOSES ONLY.  IF CONFIRMATION IS NEEDED FOR ANY PURPOSE, NOTIFY LAB WITHIN 5 DAYS.        LOWEST DETECTABLE LIMITS FOR URINE DRUG SCREEN Drug Class       Cutoff (ng/mL) Amphetamine      1000 Barbiturate      200 Benzodiazepine   364 Tricyclics       680 Opiates          300 Cocaine          300 THC              50   Urinalysis, Routine w reflex microscopic (not at [facility])     Status: None   Collection Time: 06/19/16 11:07 PM  Result Value Ref Range   Color, Urine YELLOW YELLOW   APPearance CLEAR CLEAR   Specific Gravity, Urine 1.007 1.005 - 1.030   pH 7.0 5.0 - 8.0   Glucose, UA NEGATIVE NEGATIVE mg/dL   Hgb urine dipstick NEGATIVE NEGATIVE   Bilirubin Urine NEGATIVE NEGATIVE   Ketones, ur NEGATIVE NEGATIVE mg/dL   Protein, ur NEGATIVE NEGATIVE mg/dL   Nitrite NEGATIVE NEGATIVE  Leukocytes, UA NEGATIVE NEGATIVE    Comment: MICROSCOPIC NOT DONE ON URINES WITH NEGATIVE PROTEIN, BLOOD, LEUKOCYTES, NITRITE, OR GLUCOSE <1000 mg/dL.  Protime-INR     Status: None   Collection Time: 06/20/16  3:54 AM  Result Value Ref Range   Prothrombin Time 15.0 11.4 - 15.2 seconds   INR 1.17   Heparin level (unfractionated)     Status: Abnormal   Collection Time: 06/20/16  3:54 AM  Result Value Ref Range   Heparin Unfractionated 0.20 (L) 0.30 - 0.70 IU/mL    Comment:        IF HEPARIN RESULTS ARE BELOW EXPECTED VALUES, AND PATIENT DOSAGE HAS BEEN CONFIRMED, SUGGEST FOLLOW UP TESTING OF ANTITHROMBIN III LEVELS.   Basic metabolic panel     Status: Abnormal   Collection Time:  06/20/16  8:38 AM  Result Value Ref Range   Sodium 134 (L) 135 - 145 mmol/L   Potassium 3.4 (L) 3.5 - 5.1 mmol/L   Chloride 99 (L) 101 - 111 mmol/L   CO2 24 22 - 32 mmol/L   Glucose, Bld 140 (H) 65 - 99 mg/dL   BUN <5 (L) 6 - 20 mg/dL   Creatinine, Ser 0.66 0.44 - 1.00 mg/dL   Calcium 8.9 8.9 - 10.3 mg/dL   GFR calc non Af Amer >60 >60 mL/min   GFR calc Af Amer >60 >60 mL/min    Comment: (NOTE) The eGFR has been calculated using the CKD EPI equation. This calculation has not been validated in all clinical situations. eGFR's persistently <60 mL/min signify possible Chronic Kidney Disease.    Anion gap 11 5 - 15  CBG monitoring, ED     Status: Abnormal   Collection Time: 06/20/16 12:20 PM  Result Value Ref Range   Glucose-Capillary 158 (H) 65 - 99 mg/dL   Comment 1 Notify RN   CBG monitoring, ED     Status: Abnormal   Collection Time: 06/20/16  2:51 PM  Result Value Ref Range   Glucose-Capillary 122 (H) 65 - 99 mg/dL   Ct Head Wo Contrast  Result Date: 06/19/2016 CLINICAL DATA:  75 year old female with high blood pressure and possible stroke. EXAM: CT HEAD WITHOUT CONTRAST TECHNIQUE: Contiguous axial images were obtained from the base of the skull through the vertex without intravenous contrast. COMPARISON:  None. FINDINGS: Brain: The ventricles and sulci are appropriate size for patient's age. Mild periventricular and deep white matter chronic microvascular ischemic changes noted. Areas of low attenuation involving the periventricular white matter and adjacent to the basal ganglia in the frontal lobe bilaterally are likely chronic and related to microvascular ischemic changes. Acute ischemia is less likely. MRI may provide better evaluation if there is high clinical concern for acute infarct. There is no acute intracranial hemorrhage. No mass effect or midline shift noted. No extra-axial fluid collection. Vascular: No hyperdense vessel or unexpected calcification. Skull: Normal.  Negative for fracture or focal lesion. Sinuses/Orbits: No acute finding. Other: None IMPRESSION: No acute intracranial hemorrhage. Mild age-related atrophy and chronic microvascular ischemic disease. If symptoms persist and there are no contraindications, MRI may provide better evaluation if clinically indicated Electronically Signed   By: Anner Crete M.D.   On: 06/19/2016 22:49   Mr Brain Wo Contrast  Result Date: 06/20/2016 CLINICAL DATA:  Initial evaluation for acute altered mental status. EXAM: MRI HEAD WITHOUT CONTRAST TECHNIQUE: Multiplanar, multiecho pulse sequences of the brain and surrounding structures were obtained without intravenous contrast. COMPARISON:  Prior CT from 06/19/2016. FINDINGS:  Brain: Study mildly limited by motion artifact in the patient's inability to tolerate the full length of the exam. Axial T1 weighted sequence was not performed. Diffuse prominence of the CSF containing spaces is compatible with generalized age-related cerebral atrophy. Patchy and confluent T2/FLAIR hyperintensity within the periventricular and deep white matter both cerebral hemispheres most likely related to chronic microvascular ischemic disease, mild in nature. Probable small remote lacunar infarcts noted within the bilateral basal ganglia. Small remote bilateral cerebellar infarcts noted as well. No abnormal foci of restricted diffusion to suggest acute or subacute ischemia. Gray-white matter differentiation maintained. No acute or chronic intracranial hemorrhage. No mass lesion, midline shift, or mass effect. No hydrocephalus. No extra-axial fluid collection. Major dural sinuses are grossly patent. Pituitary gland within normal limits. Vascular: Major intracranial vascular flow voids are preserved. Right vertebral artery hypoplastic. Skull and upper cervical spine: Craniocervical junction within normal limits. Visualized upper cervical spine unremarkable. Bone marrow signal intensity within normal  limits. No scalp soft tissue abnormality. Sinuses/Orbits: Globes and orbital soft tissues grossly normal. Patient is status post lens extraction bilaterally. Mild scattered mucosal thickening within the ethmoidal air cells. Paranasal sinuses are otherwise clear. No mastoid effusion. Inner ear structures grossly normal. IMPRESSION: 1. No acute intracranial process identified. 2. 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 3. Generalized cerebral atrophy with mild chronic microvascular ischemic disease. Electronically Signed   By: Jeannine Boga M.D.   On: 06/20/2016 03:55   EEG Description: The patient is awake during the recording.  During maximal wakefulness, there is a symmetric, medium voltage 10 Hz posterior dominant rhythm that attenuates with eye opening.  The record is symmetric.  Stage 2 sleep is not seen.    At 00:14:20, there is onset of generalized 2 Hz slowing that evolves to generalized sharp waves occurring one per second and then evolves to generalized 3 Hz sharp waves with possible bi-frontal preponderance, before ending at 00:39:00 followed by diffuse suppression of the background.  Clinically, at 00:14:30, patient is unresponsive, looking around the room with arms flexed and shaking.  At 00:37:00, her eyes are rolled back with grimace and chewing automatisms, right arm flexed and shaking and head and gaze to the left, before ending at 00:39:00.  She was given 1 mg Ativan at 00:37:12 and at 00:41:52  EKG lead was unremarkable.  Impression: This awake EEG is abnormal due to non-lateralizing seizure.  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  She is afebrile and infectious processes unlikely. MRI of  the brain without acute process. Seizure was witnessed today while EEG and 70m ativan was given. Patient is on xanax at home will continue as ativan IV.   Recommendations: 1. Repeat MRI of the brain w/contrast to rule out any metastatic disease 2. On continuous EEG 4. Loaded with 1501mkeppra and then 1g bid 5. Slow correction of serum sodium planned 6. Patient is unable to drive, operate heavy machinery, perform activities at heights or participate in water activities until release by outpatient physician. This was discussed with the patient who expressed understanding.  7. Patient is on xanax at home will continue as ativan IV.   We will continue to follow this patient with you.  Personally examined patient and images and EEG recording and data, and have participated in and made  any corrections needed to history, physical, neuro exam,assessment and plan as stated above.  I have personally obtained the history, evaluated lab date, reviewed imaging studies and agree with radiology interpretations.   Sarina Ill, MD [facility] [phone]  A total of 75 minutes was spent face-to-face with this patient. Over half this time was spent on counseling patient on the seizure diagnosis and different diagnostic and therapeutic options available.       06/20/2016, 3:17 PM

## 2016-06-20 NOTE — ED Notes (Signed)
Spoke with admitting MD about patient's seizure. Requested to page admitting. Made aware patient was unable to get access.

## 2016-06-20 NOTE — Progress Notes (Signed)
ANTICOAGULATION CONSULT NOTE - Initial Consult  Pharmacy Consult for Heparin Indication: h/o PE/DVT and Afib  Allergies  Allergen Reactions  . Morphine And Related Other (See Comments)    Usually drops her blood pressure  . Cymbalta [Duloxetine Hcl] Other (See Comments)    Nausea, headache and diarrhea   . Amoxicillin Other (See Comments)    Unknown Has patient had a PCN reaction causing immediate rash, facial/tongue/throat swelling, SOB or lightheadedness with hypotension:unsure Has patient had a PCN reaction causing severe rash involving mucus membranes or skin necrosis:unsure Has patient had a PCN reaction that required hospitalization:unsure Has patient had a PCN reaction occurring within the last 10 years:unsure If all of the above answers are "NO", then may proceed with Cephalosporin use.     Marland Kitchen Amoxicillin-Pot Clavulanate Other (See Comments)    Has patient had a PCN reaction causing immediate rash, facial/tongue/throat swelling, SOB or lightheadedness with hypotension:unsure Has patient had a PCN reaction causing severe rash involving mucus membranes or skin necrosis:unsure Has patient had a PCN reaction that required hospitalization:unsure Has patient had a PCN reaction occurring within the last 10 years:unsure If all of the above answers are "NO", then may proceed with Cephalosporin use.    . Citalopram Hydrobromide Itching       . Clonazepam Itching  . Macrobid [Nitrofurantoin Macrocrystal] Nausea And Vomiting  . Penicillins Other (See Comments)    Unknown Has patient had a PCN reaction causing immediate rash, facial/tongue/throat swelling, SOB or lightheadedness with hypotension:unsure Has patient had a PCN reaction causing severe rash involving mucus membranes or skin necrosis:unsure Has patient had a PCN reaction that required hospitalization:unsure Has patient had a PCN reaction occurring within the last 10 years:unsure If all of the above answers are "NO", then  may proceed with Cephalosporin use.      . Pyridium [Phenazopyridine Hcl] Other (See Comments)    headache  . Sertraline Hcl Anxiety  . Wellbutrin [Bupropion] Itching, Rash and Other (See Comments)    Itching, rash, hyper   . Zonegran Other (See Comments)    Unknown   . Zonisamide Other (See Comments)    Unknown     Patient Measurements:   Heparin Dosing Weight: 65 kg  Vital Signs: Temp: 97.9 F (36.6 C) (10/17 2007) BP: 124/89 (10/17 2300) Pulse Rate: 100 (10/17 2030)  Labs:  Recent Labs  06/18/16 0814 06/19/16 2059 06/19/16 2115  HGB 13.3 10.9* 13.3  HCT 39.0 33.9* 39.0  PLT  --  483*  --   APTT  --  35  --   LABPROT  --  15.4*  --   INR  --  1.21  --   CREATININE 0.60 0.93 0.70    Estimated Creatinine Clearance: 58 mL/min (by C-G formula based on SCr of 0.7 mg/dL).   Medical History: Past Medical History:  Diagnosis Date  . Anemia   . Anxiety    SEVERE  . Arthritis    "probably in my back" (05/08/2016)  . Atrial fibrillation with RVR ([place]) 11/28/2012  . Chronic lower back pain   . Coronary artery disease   . Diverticulitis   . DVT (deep venous thrombosis) ([place])   . Emphysema of lung ([place])   . GERD (gastroesophageal reflux disease)   . High cholesterol   . Hyperglycemia   . Hyperlipidemia   . Hypertension   . Impaired mobility    ambulates with walker.  . Leukocytosis   . Multiple drug allergies   . Neuropathy ([place])   .  On home oxygen therapy    "2L; 24/7 prn" (05/08/2016)  . PE (pulmonary embolism)    Bilateral PE, stringy saddle embolus with predominant proximal lower lobe pulmonary artery involvement 06/26/12  . Type II diabetes mellitus (HCC)     Medications:  No current facility-administered medications on file prior to encounter.    Current Outpatient Prescriptions on File Prior to Encounter  Medication Sig Dispense Refill  . acetaminophen (TYLENOL) 325 MG tablet Take 325-650 mg by mouth every 6 (six) hours as needed (for  headache).     Marland Kitchen albuterol (PROVENTIL HFA;VENTOLIN HFA) 108 (90 Base) MCG/ACT inhaler Inhale 2 puffs into the lungs every 6 (six) hours as needed for wheezing or shortness of breath. 1 Inhaler 2  . alprazolam (XANAX) 2 MG tablet Take 1 tablet (2 mg total) by mouth 3 (three) times daily. (Patient taking differently: Take 1 mg by mouth 6 (six) times daily. ) 20 tablet 0  . Cholecalciferol (VITAMIN D) 2000 UNITS tablet Take 2,000 Units by mouth daily.    . ciprofloxacin (CIPRO) 500 MG tablet Take 1 tablet (500 mg total) by mouth 2 (two) times daily. (Patient taking differently: Take 500 mg by mouth 2 (two) times daily. Started 10/11 for 10 days) 20 tablet 0  . clobetasol cream (TEMOVATE) [id number] % Apply 1 application topically 2 (two) times daily as needed (for yeast infection).    . enoxaparin (LOVENOX) 80 MG/0.8ML injection Inject 0.8 mLs (80 mg total) into the skin every 12 (twelve) hours. Stop taking after the dose on October 17th, 2017 14 Syringe 0  . esomeprazole (NEXIUM) 40 MG capsule Take 1 capsule (40 mg total) by mouth daily as needed. For acid reflux. (Patient taking differently: Take 40 mg by mouth daily at 12 noon. For acid reflux.) 90 capsule 1  . fenofibrate 160 MG tablet Take 1 tablet (160 mg total) by mouth daily. 90 tablet 1  . Fluticasone Furoate-Vilanterol (BREO ELLIPTA IN) Inhale 1 puff into the lungs daily.    . folic acid (FOLVITE) 1 MG tablet Take 1 tablet (1 mg total) by mouth daily. 90 tablet 1  . furosemide (LASIX) 40 MG tablet TAKE 1 TABLET BY MOUTH EVERY DAY (Patient taking differently: Take 20 mg by mouth every day) 90 tablet 1  . HYDROcodone-acetaminophen (NORCO/VICODIN) 5-325 MG tablet Take 1-2 tablets by mouth every 4 (four) hours as needed. (Patient taking differently: Take 0.5 tablets by mouth every 4 (four) hours as needed for moderate pain or severe pain. ) 20 tablet 0  . levothyroxine (SYNTHROID, LEVOTHROID) 50 MCG tablet TAKE 1 TABLET (50 MCG TOTAL) BY MOUTH DAILY. 90  tablet 1  . lisinopril (PRINIVIL,ZESTRIL) 10 MG tablet TAKE 1 TABLET BY MOUTH EVERY DAY (Patient taking differently: Take 10 mg by mouth every day) 90 tablet 1  . metFORMIN (GLUCOPHAGE-XR) 500 MG 24 hr tablet Take 500-1,000 mg by mouth See admin instructions. Pt takes 1000mg  in morning after breakfast, 500mg  in evening after dinner    . metoprolol succinate (TOPROL XL) 25 MG 24 hr tablet Take 1 tablet (25 mg total) by mouth daily. 90 tablet 3  . metroNIDAZOLE (FLAGYL) 500 MG tablet Take 1 tablet (500 mg total) by mouth every 8 (eight) hours. (Patient taking differently: Take 500 mg by mouth every 8 (eight) hours. Started 10/11 for 10 days) 30 tablet 0  . Multiple Vitamin (MULTIVITAMIN) tablet Take 1 tablet by mouth daily.      . OXYGEN Inhale 2 L into the lungs daily  as needed (during activity and while sleeping).     . sertraline (ZOLOFT) 25 MG tablet Take 1 tablet (25 mg total) by mouth at bedtime. 30 tablet 3  . simvastatin (ZOCOR) 20 MG tablet TAKE 1 TABLET BY MOUTH EVERY EVENING (Patient taking differently: Take 20 mg by mouth every evening) 90 tablet 1  . terconazole (TERAZOL 7) 0.4 % vaginal cream Place 1 applicator vaginally at bedtime as needed (itching).    . warfarin (COUMADIN) 5 MG tablet Take 2.5-5 mg by mouth See admin instructions. Takes 5mg  Tuesday, Thursday, and Sunday, then takes 2.5mg  on Monday, Friday, and Saturday. Nothing on Wednesday  1  . glucose blood (ONE TOUCH ULTRA TEST) test strip TEST TWICE A DAY AS DIRECTED 100 each 12  . potassium chloride SA (K-DUR,KLOR-CON) 20 MEQ tablet Take 1 tablet (20 mEq total) by mouth daily. 3 tablet 0     Assessment: 75 y.o. female admitted with AMS, h/o PE/DVT and Afib, for heparin.  Coumadin has been on hold since last week in anticipation of EUS and biopsy, and pt has been receiving Lovenox 80 mg SQ q12h, time of last dose unknown, though likely prior to noon 10/17.     Goal of Therapy:  Heparin level 0.3-0.7 units/ml Monitor platelets  by anticoagulation protocol: Yes   Plan:  Start heparin 900 units/hr Check heparin level in 8 hours.   Ellyn Rubiano, [place] 06/20/2016,2:13 AM

## 2016-06-20 NOTE — ED Notes (Signed)
EEG Tech at the bedside placing electrodes for long term monitoring.

## 2016-06-20 NOTE — Progress Notes (Addendum)
PROGRESS NOTE    Sarah Mcguire  [id number] DOB: [birth year] DOA: 06/19/2016 PCP: Annye Asa, MD   Outpatient Specialists:     Brief Narrative:  Sarah Mcguire is a 75 y.o. 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  The patient was completely out of it, wouldn't talk, rolling eyes to the back of her head, chewed on her tongue.  Husband called EMS.  This is the first time this has ever happened, to their knowledge.  Uncertain if seizure activity vs. Panic attack.  Also, toes turning upward, causing a lot of pain and making it difficult for her to walk.  This has been over the last 2-3 days.  Scheduled to have EUS for diagnosis of pancreatitic cancer but she couldn't walk so they had to cancel.  She was previously hospitalized from 10/7-11/17 for diverticulitis, at which time a CT showed an incidental mass at the pancreatic tail that appears to include possible metastatic disease.  She is still taking Cipro/Flagyl, has 3-4 days left.  No GI symptoms.  Has not been eating much.  Also had urinary retention during that hospitalization - a foley catheter was recommended but she declined.  She currently denies problems with retention.  She was seen again in the ER on 10/13 for abdominal pain and urinary retention.  She had an I/O cath and was also diagnosed with anxiety at that time.    She returned on 10/16 for panic attack, SOB, and foot pain.  She was placed in a CAM walker and given potassium for hypokalemia.  She has a h/o DVT/PE as well as afib and takes Warfarin.  This had been held since her prior hospitalization in anticipation of upcoming ERCP but she took 10 mg tonight to restart it since she had to cancel the EUS.    Assessment & Plan:   Principal Problem:   Altered mental status Active Problems:   Diabetes mellitus with neuropathy (HCC)   Anemia  Anxiety and depression   Essential hypertension   Long term current use of anticoagulant therapy   Foot pain   Atrial fibrillation (HCC)   Hypothyroidism   COPD (chronic obstructive pulmonary disease) (HCC)   Chronic systolic CHF (congestive heart failure) (HCC)   Pancreatic mass   Diverticulitis   Hyponatremia   Seizures (HCC)   Seizures -? Xanax withdrawal-- take 1mg  six times a day but has not taken as much over last few days maybe 2-3 times NOT 6 VS brain metastatic disease--- will need MRI WITH  CONTRAST -on continuous EEG -ativan q 8 hours schedule -keppra IV -appreciate neuro -NPO  DM -SSI  Hypotension -IVF -bolus once get PICC  A fib -resume home meds when able  HTN -hold as BP low  prob pancreatic cancer -needs EUS  hypo magnesium/hypokalemia -replete   DVT prophylaxis:  Fully anticoagulated   Code Status: Full Code   Family Communication: Spoke at length with husband-- does not seem to understand at all what is going on-- in denial about pancreatic mass  Disposition Plan:     Consultants:     Procedures:         Subjective: Now sleepy after ativan  Objective: Vitals:   06/20/16 1417 06/20/16 1430 06/20/16 1445 06/20/16 1500  BP: 92/64 101/69 (!) 88/73 (!) 86/64  Pulse: 111 (!) 138 61   Resp:  (!) 27 24 20   Temp:  SpO2:  100% 98%     Intake/Output Summary (Last 24 hours) at 06/20/16 1525 Last data filed at 06/20/16 1402  Gross per 24 hour  Intake             2200 ml  Output                0 ml  Net             2200 ml   There were no vitals filed for this visit.  Examination:  General exam: sleepy-- knows name Respiratory system: Clear to auscultation. Mild wheezing Cardiovascular system: IRR. No JVD, murmurs,. Gastrointestinal system: Abdomen is nondistended, soft and nontender. No organomegaly or masses felt. Normal bowel sounds heard. Central nervous system: Alert and oriented. No focal neurological  deficits.     Data Reviewed: I have personally reviewed following labs and imaging studies  CBC:  Recent Labs Lab 06/15/16 1951 06/18/16 0814 06/19/16 2059 06/19/16 2115  WBC 9.3  --  10.3  --   NEUTROABS  --   --  7.7  --   HGB 10.8* 13.3 10.9* 13.3  HCT 33.3* 39.0 33.9* 39.0  MCV 82.8  --  84.3  --   PLT 446*  --  483*  --    Basic Metabolic Panel:  Recent Labs Lab 06/15/16 1951 06/18/16 0814 06/19/16 2059 06/19/16 2115 06/20/16 0838 06/20/16 1505  NA 129* 128* 129* 128* 134*  --   K 3.9 3.0* 3.0* 3.0* 3.4*  --   CL 92* 88* 88* 88* 99*  --   CO2 23  --  22  --  24  --   GLUCOSE 118* 134* 206* 186* 140*  --   BUN 8 5* 6 6 <5*  --   CREATININE 0.85 0.60 0.93 0.70 0.66  --   CALCIUM 10.2  --  9.9  --  8.9  --   MG  --   --   --   --   --  1.1*   GFR: Estimated Creatinine Clearance: 58 mL/min (by C-G formula based on SCr of 0.66 mg/dL). Liver Function Tests:  Recent Labs Lab 06/15/16 1951 06/19/16 2059  AST 25 56*  ALT 12* 31  ALKPHOS 43 46  BILITOT 0.3 0.6  PROT 6.6 6.7  ALBUMIN 3.9 3.9    Recent Labs Lab 06/15/16 1951  LIPASE 17   No results for input(s): AMMONIA in the last 168 hours. Coagulation Profile:  Recent Labs Lab 06/19/16 2059 06/20/16 0354  INR 1.21 1.17   Cardiac Enzymes: No results for input(s): CKTOTAL, CKMB, CKMBINDEX, TROPONINI in the last 168 hours. BNP (last 3 results) No results for input(s): PROBNP in the last 8760 hours. HbA1C: No results for input(s): HGBA1C in the last 72 hours. CBG:  Recent Labs Lab 06/19/16 2007 06/20/16 1220 06/20/16 1451  GLUCAP 175* 158* 122*   Lipid Profile: No results for input(s): CHOL, HDL, LDLCALC, TRIG, CHOLHDL, LDLDIRECT in the last 72 hours. Thyroid Function Tests: No results for input(s): TSH, T4TOTAL, FREET4, T3FREE, THYROIDAB in the last 72 hours. Anemia Panel: No results for input(s): VITAMINB12, FOLATE, FERRITIN, TIBC, IRON, RETICCTPCT in the last 72 hours. Urine  analysis:    Component Value Date/Time   COLORURINE YELLOW 06/19/2016 2307   APPEARANCEUR CLEAR 06/19/2016 2307   LABSPEC 1.007 06/19/2016 2307   PHURINE 7.0 06/19/2016 2307   GLUCOSEU NEGATIVE 06/19/2016 2307   HGBUR NEGATIVE 06/19/2016 2307   HGBUR negative 10/24/2010 1140  BILIRUBINUR NEGATIVE 06/19/2016 2307   BILIRUBINUR negative 06/04/2016 1108   [place] 06/19/2016 2307   PROTEINUR NEGATIVE 06/19/2016 2307   UROBILINOGEN 0.2 06/04/2016 1108   UROBILINOGEN 0.2 04/04/2015 1328   NITRITE NEGATIVE 06/19/2016 2307   LEUKOCYTESUR NEGATIVE 06/19/2016 2307     )No results found for this or any previous visit (from the past 240 hour(s)).    Anti-infectives    None       Radiology Studies: Ct Head Wo Contrast  Result Date: 06/19/2016 CLINICAL DATA:  75 year old female with high blood pressure and possible stroke. EXAM: CT HEAD WITHOUT CONTRAST TECHNIQUE: Contiguous axial images were obtained from the base of the skull through the vertex without intravenous contrast. COMPARISON:  None. FINDINGS: Brain: The ventricles and sulci are appropriate size for patient's age. Mild periventricular and deep white matter chronic microvascular ischemic changes noted. Areas of low attenuation involving the periventricular white matter and adjacent to the basal ganglia in the frontal lobe bilaterally are likely chronic and related to microvascular ischemic changes. Acute ischemia is less likely. MRI may provide better evaluation if there is high clinical concern for acute infarct. There is no acute intracranial hemorrhage. No mass effect or midline shift noted. No extra-axial fluid collection. Vascular: No hyperdense vessel or unexpected calcification. Skull: Normal. Negative for fracture or focal lesion. Sinuses/Orbits: No acute finding. Other: None IMPRESSION: No acute intracranial hemorrhage. Mild age-related atrophy and chronic microvascular ischemic disease. If symptoms persist and  there are no contraindications, MRI may provide better evaluation if clinically indicated Electronically Signed   By: Anner Crete M.D.   On: 06/19/2016 22:49   Mr Brain Wo Contrast  Result Date: 06/20/2016 CLINICAL DATA:  Initial evaluation for acute altered mental status. EXAM: MRI HEAD WITHOUT CONTRAST TECHNIQUE: Multiplanar, multiecho pulse sequences of the brain and surrounding structures were obtained without intravenous contrast. COMPARISON:  Prior CT from 06/19/2016. FINDINGS: Brain: Study mildly limited by motion artifact in the patient's inability to tolerate the full length of the exam. Axial T1 weighted sequence was not performed. Diffuse prominence of the CSF containing spaces is compatible with generalized age-related cerebral atrophy. Patchy and confluent T2/FLAIR hyperintensity within the periventricular and deep white matter both cerebral hemispheres most likely related to chronic microvascular ischemic disease, mild in nature. Probable small remote lacunar infarcts noted within the bilateral basal ganglia. Small remote bilateral cerebellar infarcts noted as well. No abnormal foci of restricted diffusion to suggest acute or subacute ischemia. Gray-white matter differentiation maintained. No acute or chronic intracranial hemorrhage. No mass lesion, midline shift, or mass effect. No hydrocephalus. No extra-axial fluid collection. Major dural sinuses are grossly patent. Pituitary gland within normal limits. Vascular: Major intracranial vascular flow voids are preserved. Right vertebral artery hypoplastic. Skull and upper cervical spine: Craniocervical junction within normal limits. Visualized upper cervical spine unremarkable. Bone marrow signal intensity within normal limits. No scalp soft tissue abnormality. Sinuses/Orbits: Globes and orbital soft tissues grossly normal. Patient is status post lens extraction bilaterally. Mild scattered mucosal thickening within the ethmoidal air cells.  Paranasal sinuses are otherwise clear. No mastoid effusion. Inner ear structures grossly normal. IMPRESSION: 1. No acute intracranial process identified. 2. 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 3. Generalized cerebral atrophy with mild chronic microvascular ischemic disease. Electronically Signed   By: Jeannine Boga M.D.   On: 06/20/2016 03:55        Scheduled Meds: . fluticasone furoate-vilanterol  1 puff Inhalation  Daily  . insulin aspart  0-9 Units Subcutaneous Q4H  . levothyroxine  25 mcg Intravenous Daily  . LORazepam  0.5 mg Intravenous Q6H  . [START ON 06/21/2016] metoprolol succinate  25 mg Oral Daily  . sertraline  25 mg Oral QHS  . simvastatin  20 mg Oral QPM  . sodium chloride flush  3 mL Intravenous Q12H   Continuous Infusions: . sodium chloride 50 mL/hr at 06/20/16 1251  . famotidine (PEPCID) IV    . heparin 900 Units/hr (06/20/16 1414)  . levETIRAcetam    . potassium chloride    . sodium chloride       LOS: 0 days    Time spent: 35 min    [place], DO [facility] Pager [phone]  If 7PM-7AM, please contact night-coverage [email or web address] Password [email or web address] 06/20/2016, 3:25 PM

## 2016-06-20 NOTE — ED Notes (Signed)
Changed the pt's brief, with the assistance of Jarrett Soho, Therapist, sports.

## 2016-06-20 NOTE — ED Notes (Signed)
Neurology at the bedside

## 2016-06-20 NOTE — Telephone Encounter (Signed)
Noted.  Pt is now admitted again.  Will follow her d/c planning closely as it appears we are nearing a point where assisted living may be required

## 2016-06-20 NOTE — ED Notes (Signed)
CBG 119 

## 2016-06-20 NOTE — ED Notes (Signed)
Pt given 1 mg of Ativan per Order by MD Jaynee Eagles

## 2016-06-20 NOTE — ED Notes (Signed)
Took over care of the patient at this time  

## 2016-06-20 NOTE — ED Notes (Signed)
Pt given 2 mg of Ativan Per Jaynee Eagles MD

## 2016-06-20 NOTE — Progress Notes (Addendum)
ANTICOAGULATION CONSULT NOTE - Follow Up Consult  Pharmacy Consult for Heparin Indication: h/o PE/DVT and Afib  Allergies  Allergen Reactions  . Morphine And Related Other (See Comments)    Usually drops her blood pressure  . Cymbalta [Duloxetine Hcl] Other (See Comments)    Nausea, headache and diarrhea   . Amoxicillin Other (See Comments)    Unknown Has patient had a PCN reaction causing immediate rash, facial/tongue/throat swelling, SOB or lightheadedness with hypotension:unsure Has patient had a PCN reaction causing severe rash involving mucus membranes or skin necrosis:unsure Has patient had a PCN reaction that required hospitalization:unsure Has patient had a PCN reaction occurring within the last 10 years:unsure If all of the above answers are "NO", then may proceed with Cephalosporin use.     Marland Kitchen Amoxicillin-Pot Clavulanate Other (See Comments)    Has patient had a PCN reaction causing immediate rash, facial/tongue/throat swelling, SOB or lightheadedness with hypotension:unsure Has patient had a PCN reaction causing severe rash involving mucus membranes or skin necrosis:unsure Has patient had a PCN reaction that required hospitalization:unsure Has patient had a PCN reaction occurring within the last 10 years:unsure If all of the above answers are "NO", then may proceed with Cephalosporin use.    . Citalopram Hydrobromide Itching       . Clonazepam Itching  . Macrobid [Nitrofurantoin Macrocrystal] Nausea And Vomiting  . Penicillins Other (See Comments)    Unknown Has patient had a PCN reaction causing immediate rash, facial/tongue/throat swelling, SOB or lightheadedness with hypotension:unsure Has patient had a PCN reaction causing severe rash involving mucus membranes or skin necrosis:unsure Has patient had a PCN reaction that required hospitalization:unsure Has patient had a PCN reaction occurring within the last 10 years:unsure If all of the above answers are "NO",  then may proceed with Cephalosporin use.      . Pyridium [Phenazopyridine Hcl] Other (See Comments)    headache  . Sertraline Hcl Anxiety  . Wellbutrin [Bupropion] Itching, Rash and Other (See Comments)    Itching, rash, hyper   . Zonegran Other (See Comments)    Unknown   . Zonisamide Other (See Comments)    Unknown     Patient Measurements:   Heparin Dosing Weight: 65 kg  Vital Signs: Temp: 100.7 F (38.2 C) (10/18 1954) Temp Source: Axillary (10/18 1954) BP: 111/64 (10/18 1954) Pulse Rate: 110 (10/18 1954)  Labs:  Recent Labs  06/18/16 0814 06/19/16 2059 06/19/16 2115 06/20/16 0354 06/20/16 0838 06/20/16 2123  HGB 13.3 10.9* 13.3  --   --   --   HCT 39.0 33.9* 39.0  --   --   --   PLT  --  483*  --   --   --   --   APTT  --  35  --   --   --   --   LABPROT  --  15.4*  --  15.0  --   --   INR  --  1.21  --  1.17  --   --   HEPARINUNFRC  --   --   --  0.20*  --  0.20*  CREATININE 0.60 0.93 0.70  --  0.66  --     Estimated Creatinine Clearance: 58 mL/min (by C-G formula based on SCr of 0.66 mg/dL).   Medical History: Past Medical History:  Diagnosis Date  . Anemia   . Anxiety    SEVERE  . Arthritis    "probably in my back" (05/08/2016)  . Atrial  fibrillation with RVR ([place]) 11/28/2012  . Chronic lower back pain   . Coronary artery disease   . Diverticulitis   . DVT (deep venous thrombosis) ([place])   . Emphysema of lung ([place])   . GERD (gastroesophageal reflux disease)   . High cholesterol   . Hyperglycemia   . Hyperlipidemia   . Hypertension   . Impaired mobility    ambulates with walker.  . Leukocytosis   . Multiple drug allergies   . Neuropathy ([place])   . On home oxygen therapy    "2L; 24/7 prn" (05/08/2016)  . PE (pulmonary embolism)    Bilateral PE, stringy saddle embolus with predominant proximal lower lobe pulmonary artery involvement 06/26/12  . Type II diabetes mellitus (HCC)     Medications:  No current facility-administered  medications on file prior to encounter.    Current Outpatient Prescriptions on File Prior to Encounter  Medication Sig Dispense Refill  . acetaminophen (TYLENOL) 325 MG tablet Take 325-650 mg by mouth every 6 (six) hours as needed (for headache).     Marland Kitchen albuterol (PROVENTIL HFA;VENTOLIN HFA) 108 (90 Base) MCG/ACT inhaler Inhale 2 puffs into the lungs every 6 (six) hours as needed for wheezing or shortness of breath. 1 Inhaler 2  . alprazolam (XANAX) 2 MG tablet Take 1 tablet (2 mg total) by mouth 3 (three) times daily. (Patient taking differently: Take 1 mg by mouth 6 (six) times daily. ) 20 tablet 0  . Cholecalciferol (VITAMIN D) 2000 UNITS tablet Take 2,000 Units by mouth daily.    . ciprofloxacin (CIPRO) 500 MG tablet Take 1 tablet (500 mg total) by mouth 2 (two) times daily. (Patient taking differently: Take 500 mg by mouth 2 (two) times daily. Started 10/11 for 10 days) 20 tablet 0  . clobetasol cream (TEMOVATE) [id number] % Apply 1 application topically 2 (two) times daily as needed (for yeast infection).    . enoxaparin (LOVENOX) 80 MG/0.8ML injection Inject 0.8 mLs (80 mg total) into the skin every 12 (twelve) hours. Stop taking after the dose on October 17th, 2017 14 Syringe 0  . esomeprazole (NEXIUM) 40 MG capsule Take 1 capsule (40 mg total) by mouth daily as needed. For acid reflux. (Patient taking differently: Take 40 mg by mouth daily at 12 noon. For acid reflux.) 90 capsule 1  . fenofibrate 160 MG tablet Take 1 tablet (160 mg total) by mouth daily. 90 tablet 1  . Fluticasone Furoate-Vilanterol (BREO ELLIPTA IN) Inhale 1 puff into the lungs daily.    . folic acid (FOLVITE) 1 MG tablet Take 1 tablet (1 mg total) by mouth daily. 90 tablet 1  . furosemide (LASIX) 40 MG tablet TAKE 1 TABLET BY MOUTH EVERY DAY (Patient taking differently: Take 20 mg by mouth every day) 90 tablet 1  . HYDROcodone-acetaminophen (NORCO/VICODIN) 5-325 MG tablet Take 1-2 tablets by mouth every 4 (four) hours as needed.  (Patient taking differently: Take 0.5 tablets by mouth every 4 (four) hours as needed for moderate pain or severe pain. ) 20 tablet 0  . levothyroxine (SYNTHROID, LEVOTHROID) 50 MCG tablet TAKE 1 TABLET (50 MCG TOTAL) BY MOUTH DAILY. 90 tablet 1  . lisinopril (PRINIVIL,ZESTRIL) 10 MG tablet TAKE 1 TABLET BY MOUTH EVERY DAY (Patient taking differently: Take 10 mg by mouth every day) 90 tablet 1  . metFORMIN (GLUCOPHAGE-XR) 500 MG 24 hr tablet Take 500-1,000 mg by mouth See admin instructions. Pt takes 1000mg  in morning after breakfast, 500mg  in evening after dinner    .  metoprolol succinate (TOPROL XL) 25 MG 24 hr tablet Take 1 tablet (25 mg total) by mouth daily. 90 tablet 3  . metroNIDAZOLE (FLAGYL) 500 MG tablet Take 1 tablet (500 mg total) by mouth every 8 (eight) hours. (Patient taking differently: Take 500 mg by mouth every 8 (eight) hours. Started 10/11 for 10 days) 30 tablet 0  . Multiple Vitamin (MULTIVITAMIN) tablet Take 1 tablet by mouth daily.      . OXYGEN Inhale 2 L into the lungs daily as needed (during activity and while sleeping).     . sertraline (ZOLOFT) 25 MG tablet Take 1 tablet (25 mg total) by mouth at bedtime. 30 tablet 3  . simvastatin (ZOCOR) 20 MG tablet TAKE 1 TABLET BY MOUTH EVERY EVENING (Patient taking differently: Take 20 mg by mouth every evening) 90 tablet 1  . terconazole (TERAZOL 7) 0.4 % vaginal cream Place 1 applicator vaginally at bedtime as needed (itching).    . warfarin (COUMADIN) 5 MG tablet Take 2.5-5 mg by mouth See admin instructions. Takes 5mg  Tuesday, Thursday, and Sunday, then takes 2.5mg  on Monday, Friday, and Saturday. Nothing on Wednesday  1  . glucose blood (ONE TOUCH ULTRA TEST) test strip TEST TWICE A DAY AS DIRECTED 100 each 12  . potassium chloride SA (K-DUR,KLOR-CON) 20 MEQ tablet Take 1 tablet (20 mEq total) by mouth daily. 3 tablet 0     Assessment: 75 y.o. female admitted with AMS, h/o PE/DVT and Afib, for heparin.  Coumadin has been on  hold since last week in anticipation of EUS and biopsy, and pt has been receiving Lovenox 80 mg SQ q12h, time of last dose unknown, though likely prior to noon 10/17.   Heparin drip was off for part of day - lost IV access.  Restarted about 2pm drip rate 900 uts/hr HL 0.2, less than goal.     Goal of Therapy:  Heparin level 0.3-0.7 units/ml Monitor platelets by anticoagulation protocol: Yes   Plan:  Increase heparin 1000 units/hr Daily HL, CBC    Bonnita Nasuti Pharm.D. CPP, BCPS Clinical Pharmacist [phone] 06/20/2016 11:05 PM

## 2016-06-20 NOTE — ED Notes (Signed)
Pt's CBG 158.  Informed Jarrett Soho, RN.

## 2016-06-20 NOTE — ED Notes (Signed)
IV Team unable to access IV

## 2016-06-20 NOTE — ED Notes (Signed)
EEG at the bedside  ?

## 2016-06-20 NOTE — Telephone Encounter (Signed)
Pt's husband calling to report patient has been admitted to [facility] and he wants to make sure the nurse does not come to the house as they will not be there.  I advised him we were aware pt was hospitalized and  reassured him the nurse would not be coming to their home.

## 2016-06-20 NOTE — ED Notes (Signed)
Patient actively seizing. EEG tech and Neurologist at the bedside.

## 2016-06-20 NOTE — ED Notes (Signed)
Admitting MD at the bedside.  

## 2016-06-21 ENCOUNTER — Ambulatory Visit: Payer: Self-pay

## 2016-06-21 ENCOUNTER — Other Ambulatory Visit: Payer: Self-pay

## 2016-06-21 ENCOUNTER — Ambulatory Visit (HOSPITAL_COMMUNITY): Admission: RE | Admit: 2016-06-21 | Payer: Medicare Other | Source: Ambulatory Visit | Admitting: Gastroenterology

## 2016-06-21 DIAGNOSIS — R404 Transient alteration of awareness: Secondary | ICD-10-CM

## 2016-06-21 HISTORY — DX: Atherosclerotic heart disease of native coronary artery without angina pectoris: I25.10

## 2016-06-21 HISTORY — DX: Allergy status to unspecified drugs, medicaments and biological substances: Z88.9

## 2016-06-21 HISTORY — DX: Other reduced mobility: Z74.09

## 2016-06-21 LAB — CBC
HEMATOCRIT: 25.8 % — AB (ref 36.0–46.0)
Hemoglobin: 8.4 g/dL — ABNORMAL LOW (ref 12.0–15.0)
MCH: 26.8 pg (ref 26.0–34.0)
MCHC: 32.6 g/dL (ref 30.0–36.0)
MCV: 82.4 fL (ref 78.0–100.0)
PLATELETS: 342 10*3/uL (ref 150–400)
RBC: 3.13 MIL/uL — AB (ref 3.87–5.11)
RDW: 17.7 % — ABNORMAL HIGH (ref 11.5–15.5)
WBC: 8.5 10*3/uL (ref 4.0–10.5)

## 2016-06-21 LAB — BASIC METABOLIC PANEL
Anion gap: 8 (ref 5–15)
BUN: <5 mg/dL — ABNORMAL LOW (ref 6–20)
CHLORIDE: 107 mmol/L (ref 101–111)
CO2: 22 mmol/L (ref 22–32)
CREATININE: 0.67 mg/dL (ref 0.44–1.00)
Calcium: 7.7 mg/dL — ABNORMAL LOW (ref 8.9–10.3)
GFR calc non Af Amer: >60 mL/min (ref >60)
Glucose, Bld: 125 mg/dL — ABNORMAL HIGH (ref 65–99)
POTASSIUM: 3.1 mmol/L — AB (ref 3.5–5.1)
SODIUM: 137 mmol/L (ref 135–145)

## 2016-06-21 LAB — GLUCOSE, CAPILLARY
GLUCOSE-CAPILLARY: 127 mg/dL — AB (ref 65–99)
Glucose-Capillary: 105 mg/dL — ABNORMAL HIGH (ref 65–99)
Glucose-Capillary: 179 mg/dL — ABNORMAL HIGH (ref 65–99)
Glucose-Capillary: 94 mg/dL (ref 65–99)
Glucose-Capillary: 159 mg/dL — ABNORMAL HIGH (ref 65–99)

## 2016-06-21 LAB — HEPARIN LEVEL (UNFRACTIONATED)
Heparin Unfractionated: 1.04 IU/mL — ABNORMAL HIGH (ref 0.30–0.70)
Heparin Unfractionated: 0.38 IU/mL (ref 0.30–0.70)

## 2016-06-21 LAB — MRSA PCR SCREENING: MRSA BY PCR: NEGATIVE

## 2016-06-21 LAB — CORTISOL: Cortisol, Plasma: 15.3 ug/dL

## 2016-06-21 SURGERY — ESOPHAGEAL ENDOSCOPIC ULTRASOUND (EUS) RADIAL
Anesthesia: Monitor Anesthesia Care

## 2016-06-21 MED ORDER — METRONIDAZOLE 500 MG PO TABS
500.0000 mg | ORAL_TABLET | Freq: Three times a day (TID) | ORAL | Status: DC
  Administered 2016-06-21 – 2016-06-23 (×7): 500 mg via ORAL
  Filled 2016-06-21 (×7): qty 1

## 2016-06-21 MED ORDER — INFLUENZA VAC SPLIT QUAD 0.5 ML IM SUSY: 0.5000 mL | PREFILLED_SYRINGE | INTRAMUSCULAR | Status: DC

## 2016-06-21 MED ORDER — INSULIN ASPART 100 UNIT/ML ~~LOC~~ SOLN
0.0000 [IU] | Freq: Three times a day (TID) | SUBCUTANEOUS | Status: DC
  Administered 2016-06-22 (×3): 1 [IU] via SUBCUTANEOUS
  Administered 2016-06-23: 2 [IU] via SUBCUTANEOUS
  Administered 2016-06-23: 1 [IU] via SUBCUTANEOUS

## 2016-06-21 MED ORDER — SODIUM CHLORIDE 0.9 % IV SOLN
1500.0000 mg | Freq: Two times a day (BID) | INTRAVENOUS | Status: DC
  Administered 2016-06-21 (×2): 1500 mg via INTRAVENOUS
  Filled 2016-06-21 (×4): qty 15

## 2016-06-21 MED ORDER — HEPARIN (PORCINE) IN NACL 100-0.45 UNIT/ML-% IJ SOLN
1000.0000 [IU]/h | INTRAMUSCULAR | Status: DC
  Administered 2016-06-22: 1000 [IU]/h via INTRAVENOUS
  Filled 2016-06-21: qty 250

## 2016-06-21 MED ORDER — CIPROFLOXACIN IN D5W 400 MG/200ML IV SOLN
400.0000 mg | Freq: Two times a day (BID) | INTRAVENOUS | Status: DC
  Administered 2016-06-21 – 2016-06-23 (×5): 400 mg via INTRAVENOUS
  Filled 2016-06-21 (×6): qty 200

## 2016-06-21 MED ORDER — METRONIDAZOLE IN NACL 5-0.79 MG/ML-% IV SOLN
500.0000 mg | Freq: Three times a day (TID) | INTRAVENOUS | Status: DC
  Administered 2016-06-21: 500 mg via INTRAVENOUS
  Filled 2016-06-21: qty 100

## 2016-06-21 MED ORDER — INFLUENZA VAC SPLIT QUAD 0.5 ML IM SUSY
0.5000 mL | PREFILLED_SYRINGE | INTRAMUSCULAR | Status: AC
  Administered 2016-06-21: 0.5 mL via INTRAMUSCULAR
  Filled 2016-06-21: qty 0.5

## 2016-06-21 NOTE — Progress Notes (Signed)
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   Assessment & Plan: Principal Problem:   Altered mental status Active Problems:   Diabetes mellitus with neuropathy (HCC)   Anemia   Anxiety and depression   Essential hypertension   Long term current use of anticoagulant therapy   Foot pain   Atrial fibrillation (HCC)   Hypothyroidism   COPD (chronic obstructive pulmonary disease) (HCC)   Chronic systolic CHF (congestive heart failure) (HCC)   Pancreatic mass   Diverticulitis   Hyponatremia   Seizures (HCC)   Seizures - unclear etiology, would ideally like an MRI with contrast and LP however patient and her husband are refusing these tests - on 2 AED, clinically she is improving and alert however confused at times  Anxiety - significant underlying anxiety patient taking Xanax 6 times daily, postentially playing a role in her seizures if she missed doses at home - continue Ativan scheduled for now  Leg cramps  - suspect due to kypokalemia / kypomagnesemia, improved with repletion  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 No  HA, no neck pain / photophobia / nausea so very low suspicion for meningitis - she has no abdominal complaints to suggest abscess formation or worsening diverticulitis - fever resolved   DVT prophylaxis: heparin gtt Code Status: Full Family Communication: d/w husband bedside Disposition Plan: remain inpatient, home when ready  Consultants:   Neurology   Procedures:   EEG  Antimicrobials:  Ciprofloxacin / Metronidazole    Subjective: - no chest pain, shortness of breath, no abdominal pain, nausea or vomiting. She is alert however somewhat confused  Objective: Vitals:   06/20/16 2340 06/21/16 0403 06/21/16 0828 06/21/16 0905  BP: 106/63 133/72 133/84   Pulse: 97 95 (!) 101   Resp: 16 (!) 22 20   Temp: 100 F (37.8 C) 98.2 F (36.8 C) 98.1 F (36.7 C)   TempSrc: Axillary Oral Oral   SpO2: 98% 95% 98% 100%    Intake/Output Summary (Last 24 hours) at 06/21/16 1126 Last data filed at 06/21/16 0600  Gross per 24 hour  Intake          2144.08 ml  Output                0 ml  Net          2144.08 ml   There were no vitals filed for this visit.  Examination: Constitutional: NAD Vitals:   06/20/16 2340 06/21/16 0403 06/21/16 0828 06/21/16 0905  BP: 106/63 133/72 133/84   Pulse: 97 95 (!) 101   Resp: 16 (!) 22 20   Temp: 100 F (37.8 C) 98.2 F (36.8 C) 98.1  F (36.7 C)   TempSrc: Axillary Oral Oral   SpO2: 98% 95% 98% 100%   Eyes: PERRL, lids and conjunctivae normal Respiratory: clear to auscultation bilaterally, no wheezing, no crackles. Cardiovascular: Regular rate and rhythm, no murmurs / rubs / gallops.  Abdomen: no tenderness. Bowel sounds positive.  Musculoskeletal: no clubbing / cyanosis.  Skin: no rashes, lesions, ulcers. No induration Neurologic: non focal   Data Reviewed: I have personally reviewed following labs and imaging studies  CBC:  Recent Labs Lab 06/15/16 1951 06/18/16 0814 06/19/16 2059 06/19/16 2115 06/21/16 0427  WBC 9.3  --   10.3  --  8.5  NEUTROABS  --   --  7.7  --   --   HGB 10.8* 13.3 10.9* 13.3 8.4*  HCT 33.3* 39.0 33.9* 39.0 25.8*  MCV 82.8  --  84.3  --  82.4  PLT 446*  --  483*  --  [id number]   Basic Metabolic Panel:  Recent Labs Lab 06/15/16 1951 06/18/16 0814 06/19/16 2059 06/19/16 2115 06/20/16 0838 06/20/16 1505 06/21/16 0427  NA 129* 128* 129* 128* 134*  --  137  K 3.9 3.0* 3.0* 3.0* 3.4*  --  3.1*  CL 92* 88* 88* 88* 99*  --  107  CO2 23  --  22  --  24  --  22  GLUCOSE 118* 134* 206* 186* 140*  --  125*  BUN 8 5* 6 6 <5*  --  <5*  CREATININE 0.85 0.60 0.93 0.70 0.66  --  0.67  CALCIUM 10.2  --  9.9  --  8.9  --  7.7*  MG  --   --   --   --   --  1.1*  --    GFR: Estimated Creatinine Clearance: 58 mL/min (by C-G formula based on SCr of 0.67 mg/dL). Liver Function Tests:  Recent Labs Lab 06/15/16 1951 06/19/16 2059  AST 25 56*  ALT 12* 31  ALKPHOS 43 46  BILITOT 0.3 0.6  PROT 6.6 6.7  ALBUMIN 3.9 3.9    Recent Labs Lab 06/15/16 1951  LIPASE 17   No results for input(s): AMMONIA in the last 168 hours. Coagulation Profile:  Recent Labs Lab 06/19/16 2059 06/20/16 0354  INR 1.21 1.17   CBG:  Recent Labs Lab 06/20/16 1644 06/20/16 1953 06/20/16 2340 06/21/16 0406 06/21/16 0824  GLUCAP 119* 119* 108* 127* 105*   Urine analysis:    Component Value Date/Time   COLORURINE YELLOW 06/19/2016 2307   APPEARANCEUR CLEAR 06/19/2016 2307   LABSPEC 1.007 06/19/2016 2307   PHURINE 7.0 06/19/2016 2307   GLUCOSEU NEGATIVE 06/19/2016 2307   HGBUR NEGATIVE 06/19/2016 2307   HGBUR negative 10/24/2010 [place] 06/19/2016 2307   BILIRUBINUR negative 06/04/2016 [place] 06/19/2016 2307   PROTEINUR NEGATIVE 06/19/2016 2307   UROBILINOGEN 0.2 06/04/2016 1108   UROBILINOGEN 0.2 04/04/2015 1328   NITRITE NEGATIVE 06/19/2016 2307   LEUKOCYTESUR NEGATIVE 06/19/2016 2307   Sepsis Labs: Invalid input(s): PROCALCITONIN, LACTICIDVEN  Recent  Results (from the past 240 hour(s))  MRSA PCR Screening     Status: None   Collection Time: 06/21/16  4:27 AM  Result Value Ref Range Status   MRSA by PCR NEGATIVE NEGATIVE Final    Comment:        The GeneXpert MRSA Assay (FDA approved for NASAL specimens only), is one component of a comprehensive MRSA colonization surveillance program. It is not intended to diagnose MRSA  infection nor to guide or monitor treatment for MRSA infections.       Radiology Studies: Ct Head Wo Contrast  Result Date: 06/19/2016 CLINICAL DATA:  75 year old female with high blood pressure and possible stroke. EXAM: CT HEAD WITHOUT CONTRAST TECHNIQUE: Contiguous axial images were obtained from the base of the skull through the vertex without intravenous contrast. COMPARISON:  None. FINDINGS: Brain: The ventricles and sulci are appropriate size for patient's age. Mild periventricular and deep white matter chronic microvascular ischemic changes noted. Areas of low attenuation involving the periventricular white matter and adjacent to the basal ganglia in the frontal lobe bilaterally are likely chronic and related to microvascular ischemic changes. Acute ischemia is less likely. MRI may provide better evaluation if there is high clinical concern for acute infarct. There is no acute intracranial hemorrhage. No mass effect or midline shift noted. No extra-axial fluid collection. Vascular: No hyperdense vessel or unexpected calcification. Skull: Normal. Negative for fracture or focal lesion. Sinuses/Orbits: No acute finding. Other: None IMPRESSION: No acute intracranial hemorrhage. Mild age-related atrophy and chronic microvascular ischemic disease. If symptoms persist and there are no contraindications, MRI may provide better evaluation if clinically indicated Electronically Signed   By: Anner Crete M.D.   On: 06/19/2016 22:49   Mr Brain Wo Contrast  Result Date: 06/20/2016 CLINICAL DATA:  Initial evaluation for  acute altered mental status. EXAM: MRI HEAD WITHOUT CONTRAST TECHNIQUE: Multiplanar, multiecho pulse sequences of the brain and surrounding structures were obtained without intravenous contrast. COMPARISON:  Prior CT from 06/19/2016. FINDINGS: Brain: Study mildly limited by motion artifact in the patient's inability to tolerate the full length of the exam. Axial T1 weighted sequence was not performed. Diffuse prominence of the CSF containing spaces is compatible with generalized age-related cerebral atrophy. Patchy and confluent T2/FLAIR hyperintensity within the periventricular and deep white matter both cerebral hemispheres most likely related to chronic microvascular ischemic disease, mild in nature. Probable small remote lacunar infarcts noted within the bilateral basal ganglia. Small remote bilateral cerebellar infarcts noted as well. No abnormal foci of restricted diffusion to suggest acute or subacute ischemia. Gray-white matter differentiation maintained. No acute or chronic intracranial hemorrhage. No mass lesion, midline shift, or mass effect. No hydrocephalus. No extra-axial fluid collection. Major dural sinuses are grossly patent. Pituitary gland within normal limits. Vascular: Major intracranial vascular flow voids are preserved. Right vertebral artery hypoplastic. Skull and upper cervical spine: Craniocervical junction within normal limits. Visualized upper cervical spine unremarkable. Bone marrow signal intensity within normal limits. No scalp soft tissue abnormality. Sinuses/Orbits: Globes and orbital soft tissues grossly normal. Patient is status post lens extraction bilaterally. Mild scattered mucosal thickening within the ethmoidal air cells. Paranasal sinuses are otherwise clear. No mastoid effusion. Inner ear structures grossly normal. IMPRESSION: 1. No acute intracranial process identified. 2. 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 3. Generalized cerebral atrophy with mild chronic microvascular ischemic disease. Electronically Signed   By: Jeannine Boga M.D.   On: 06/20/2016 03:55     Scheduled Meds: . ciprofloxacin  400 mg Intravenous Q12H  . famotidine (PEPCID) IV  20 mg Intravenous Q12H  . fluticasone furoate-vilanterol  1 puff Inhalation Daily  . Influenza vac split quadrivalent PF  0.5 mL Intramuscular Tomorrow-1000  . insulin aspart  0-9 Units Subcutaneous Q4H  . lacosamide (VIMPAT) IV  100 mg Intravenous Q12H  . levETIRAcetam  1,500 mg Intravenous Q12H  . levothyroxine  25 mcg Intravenous  Daily  . LORazepam  0.5 mg Intravenous Q6H  . metoprolol succinate  25 mg Oral Daily  . metroNIDAZOLE  500 mg Oral Q8H  . sertraline  25 mg Oral QHS  . simvastatin  20 mg Oral QPM  . sodium chloride flush  10-40 mL Intracatheter Q12H  . sodium chloride flush  3 mL Intravenous Q12H   Continuous Infusions: . sodium chloride 75 mL/hr at 06/21/16 0400     Marzetta Board, MD, PhD [facility] Pager [phone]  If 7PM-7AM, please contact night-coverage [email or web address] Password [email or web address] 06/21/2016, 11:26 AM

## 2016-06-21 NOTE — Progress Notes (Signed)
ANTICOAGULATION CONSULT NOTE - Follow Up Consult  Pharmacy Consult for Heparin Indication: h/o PE/DVT and Afib  Assessment: 75 y.o. female admitted with AMS, h/o PE/DVT and Afib, for heparin. Coumadin has been on hold since last week in anticipation of EUS and biopsy, and pt has been receiving Lovenox 80 mg SQ q12h, time of last dose unknown, though likely prior to noon 10/17.    Heparin drip was off for part of day yesterday, levels at goal this morning, however hgb is down significantly from 13>>8.4? Heparin now d/c'd by MD   Goal of Therapy:  Heparin level 0.3-0.7 units/ml Monitor platelets by anticoagulation protocol: Yes   Plan:  Heparin stopped  Allergies  Allergen Reactions  . Morphine And Related Other (See Comments)    Usually drops her blood pressure  . Cymbalta [Duloxetine Hcl] Other (See Comments)    Nausea, headache and diarrhea   . Amoxicillin Other (See Comments)    Unknown Has patient had a PCN reaction causing immediate rash, facial/tongue/throat swelling, SOB or lightheadedness with hypotension:unsure Has patient had a PCN reaction causing severe rash involving mucus membranes or skin necrosis:unsure Has patient had a PCN reaction that required hospitalization:unsure Has patient had a PCN reaction occurring within the last 10 years:unsure If all of the above answers are "NO", then may proceed with Cephalosporin use.     Marland Kitchen Amoxicillin-Pot Clavulanate Other (See Comments)    Has patient had a PCN reaction causing immediate rash, facial/tongue/throat swelling, SOB or lightheadedness with hypotension:unsure Has patient had a PCN reaction causing severe rash involving mucus membranes or skin necrosis:unsure Has patient had a PCN reaction that required hospitalization:unsure Has patient had a PCN reaction occurring within the last 10 years:unsure If all of the above answers are "NO", then may proceed with Cephalosporin use.    . Citalopram Hydrobromide Itching       . Clonazepam Itching  . Macrobid [Nitrofurantoin Macrocrystal] Nausea And Vomiting  . Penicillins Other (See Comments)    Unknown Has patient had a PCN reaction causing immediate rash, facial/tongue/throat swelling, SOB or lightheadedness with hypotension:unsure Has patient had a PCN reaction causing severe rash involving mucus membranes or skin necrosis:unsure Has patient had a PCN reaction that required hospitalization:unsure Has patient had a PCN reaction occurring within the last 10 years:unsure If all of the above answers are "NO", then may proceed with Cephalosporin use.      . Pyridium [Phenazopyridine Hcl] Other (See Comments)    headache  . Sertraline Hcl Anxiety  . Wellbutrin [Bupropion] Itching, Rash and Other (See Comments)    Itching, rash, hyper   . Zonegran Other (See Comments)    Unknown   . Zonisamide Other (See Comments)    Unknown     Patient Measurements:   Heparin Dosing Weight: 65 kg  Vital Signs: Temp: 98.1 F (36.7 C) (10/19 0828) Temp Source: Oral (10/19 0828) BP: 133/84 (10/19 0828) Pulse Rate: 101 (10/19 0828)  Labs:  Recent Labs  06/19/16 2059 06/19/16 2115  06/20/16 0354 06/20/16 [id number] 06/20/16 2123 06/21/16 0427 06/21/16 0540  HGB 10.9* 13.3  --   --   --   --  8.4*  --   HCT 33.9* 39.0  --   --   --   --  25.8*  --   PLT 483*  --   --   --   --   --  342  --   APTT 35  --   --   --   --   --   --   --  LABPROT 15.4*  --   --  15.0  --   --   --   --   INR 1.21  --   --  1.17  --   --   --   --   HEPARINUNFRC  --   --   < > 0.20*  --  0.20* 1.04* 0.38  CREATININE 0.93 0.70  --   --  0.66  --  0.67  --   < > = values in this interval not displayed.  Estimated Creatinine Clearance: 58 mL/min (by C-G formula based on SCr of 0.67 mg/dL).   Medical History: Past Medical History:  Diagnosis Date  . Anemia   . Anxiety    SEVERE  . Arthritis    "probably in my back" (05/08/2016)  . Atrial fibrillation with RVR ([place])  11/28/2012  . Chronic lower back pain   . Coronary artery disease   . Diverticulitis   . DVT (deep venous thrombosis) ([place])   . Emphysema of lung ([place])   . GERD (gastroesophageal reflux disease)   . High cholesterol   . Hyperglycemia   . Hyperlipidemia   . Hypertension   . Impaired mobility    ambulates with walker.  . Leukocytosis   . Multiple drug allergies   . Neuropathy ([place])   . On home oxygen therapy    "2L; 24/7 prn" (05/08/2016)  . PE (pulmonary embolism)    Bilateral PE, stringy saddle embolus with predominant proximal lower lobe pulmonary artery involvement 06/26/12  . Type II diabetes mellitus ([place])       Erin Hearing PharmD., BCPS Clinical Pharmacist Pager [phone] 06/21/2016 10:51 AM

## 2016-06-21 NOTE — Patient Outreach (Signed)
Care Coordination: Patient readmitted for altered mental status. Hospital Liaison notified.  Patient will  Be reassigned to Raina Mina when patient discharged.   Message sent to Raina Mina and Marthenia Rolling and Alycia Rossetti of [facility].  PLAN: Transition of care program close and this case manager removed from care team. Home visit appointment for today cancelled.  Tomasa Rand, RN, BSN, CEN [facility] [employer] [phone]

## 2016-06-21 NOTE — Progress Notes (Signed)
ANTICOAGULATION CONSULT NOTE - Follow Up Consult  Pharmacy Consult for Heparin Indication: h/o PE/DVT and Afib  Assessment: 75 y.o. female admitted with AMS, h/o PE/DVT and Afib, on chronic coumadin which has been on hold since last week in anticipation of EUS and biopsy. Pt was on IV heparin initially, infusion stopped this morning in anticipation for EUS, but the procedure has been canceled. Pharmacy is consulted to restart IV heparin. Previous therapeutic on 1000 units/hr. Noted Hgb drop 10.9 > 8.4, some variations in previously.    Goal of Therapy:  Heparin level 0.3-0.7 units/ml Monitor platelets by anticoagulation protocol: Yes   Plan:  Restart heparin infusion 1000 units/hr F/u AM labs    Allergies  Allergen Reactions  . Morphine And Related Other (See Comments)    Usually drops her blood pressure  . Cymbalta [Duloxetine Hcl] Other (See Comments)    Nausea, headache and diarrhea   . Amoxicillin Other (See Comments)    Unknown Has patient had a PCN reaction causing immediate rash, facial/tongue/throat swelling, SOB or lightheadedness with hypotension:unsure Has patient had a PCN reaction causing severe rash involving mucus membranes or skin necrosis:unsure Has patient had a PCN reaction that required hospitalization:unsure Has patient had a PCN reaction occurring within the last 10 years:unsure If all of the above answers are "NO", then may proceed with Cephalosporin use.     Marland Kitchen Amoxicillin-Pot Clavulanate Other (See Comments)    Has patient had a PCN reaction causing immediate rash, facial/tongue/throat swelling, SOB or lightheadedness with hypotension:unsure Has patient had a PCN reaction causing severe rash involving mucus membranes or skin necrosis:unsure Has patient had a PCN reaction that required hospitalization:unsure Has patient had a PCN reaction occurring within the last 10 years:unsure If all of the above answers are "NO", then may proceed with Cephalosporin  use.    . Citalopram Hydrobromide Itching       . Clonazepam Itching  . Macrobid [Nitrofurantoin Macrocrystal] Nausea And Vomiting  . Penicillins Other (See Comments)    Unknown Has patient had a PCN reaction causing immediate rash, facial/tongue/throat swelling, SOB or lightheadedness with hypotension:unsure Has patient had a PCN reaction causing severe rash involving mucus membranes or skin necrosis:unsure Has patient had a PCN reaction that required hospitalization:unsure Has patient had a PCN reaction occurring within the last 10 years:unsure If all of the above answers are "NO", then may proceed with Cephalosporin use.      . Pyridium [Phenazopyridine Hcl] Other (See Comments)    headache  . Sertraline Hcl Anxiety  . Wellbutrin [Bupropion] Itching, Rash and Other (See Comments)    Itching, rash, hyper   . Zonegran Other (See Comments)    Unknown   . Zonisamide Other (See Comments)    Unknown     Patient Measurements:   Heparin Dosing Weight: 65 kg  Vital Signs: Temp: 97.8 F (36.6 C) (10/19 1513) Temp Source: Oral (10/19 1513) BP: 104/70 (10/19 1513) Pulse Rate: 97 (10/19 1513)  Labs:  Recent Labs  06/19/16 2059 06/19/16 2115  06/20/16 0354 06/20/16 0838 06/20/16 2123 06/21/16 0427 06/21/16 0540  HGB 10.9* 13.3  --   --   --   --  8.4*  --   HCT 33.9* 39.0  --   --   --   --  25.8*  --   PLT 483*  --   --   --   --   --  342  --   APTT 35  --   --   --   --   --   --   --  LABPROT 15.4*  --   --  15.0  --   --   --   --   INR 1.21  --   --  1.17  --   --   --   --   HEPARINUNFRC  --   --   < > 0.20*  --  0.20* 1.04* 0.38  CREATININE 0.93 0.70  --   --  0.66  --  0.67  --   < > = values in this interval not displayed.  Estimated Creatinine Clearance: 58 mL/min (by C-G formula based on SCr of 0.67 mg/dL).  Maryanna Shape, PharmD, BCPS  Clinical Pharmacist  Pager: [phone]   06/21/2016 8:29 PM

## 2016-06-21 NOTE — Progress Notes (Addendum)
Progress note    Chief Complaint: Altered mental status and seizure-like activity.  Interval history 06/21/2016: At bedside yesterday with patient when she became altered and confused and subsequently had seizure that was comprised of tonic activity with eyes open and rolled back was given 57m ativan with resolution and loaded with [id number] and then 1g bid. Discussed with husband. Patient was alert and oriented prior to event and then sleepy but arousable and following commands afterwards. Continuous EEG showed multiple seizures yesterday and patient was loaded with Vimpat 100 mg twice a day. Repeat seizure this morning at 7 AM. She is now on 1500 mg of Keppra and 100 mg Vimpat twice a day. Discussed further testing with patient and husband including MRI of the brain with contrast and lumbar puncture and they decline at this time. The cramps in her right leg have improved with normalization of electrolytes.  HPI: Sarah Mcguire an 75.o. female history diabetes mellitus, pulmonary embolus, DVT, atrial fibrillation, coronary artery disease, hypertension and hyperlipidemia, as well as history of anxiety, brought to the emergency room following a change in mental status as well as new onset of seizure-like activity. Patient has no history of seizure disorder. She she had an episode of unable to speak with eyes deviated upwards and shaking of extremities prior to EMS arriving. She had a similar spell in the emergency room, which was followed by family unresponsive and subsequently confused on regaining consciousness. Patient is on a benzodiazepine medication and reportedly has not missed any doses of this medication. Patient has also been on Cipro 500 mg twice a day. She was afebrile. Laboratory studies showed hyponatremia with sodium 128. Blood sugar was 175. CT scan of her head is pending.  Past Medical History:  Diagnosis Date  . Anemia   . Anxiety    SEVERE  . Arthritis    "probably in my  back" (05/08/2016)  . Atrial fibrillation with RVR ([place]  . Chronic lower back pain   . Coronary artery disease   . Diverticulitis   . DVT (deep venous thrombosis) ([facility]  . Emphysema of lung ([facility]  . GERD (gastroesophageal reflux disease)   . High cholesterol   . Hyperglycemia   . Hyperlipidemia   . Hypertension   . Impaired mobility    ambulates with walker.  . Leukocytosis   . Multiple drug allergies   . Neuropathy ([facility] [place]  . On home oxygen therapy    "2L; 24/7 prn" (05/08/2016)  . PE (pulmonary embolism)    Bilateral PE, stringy saddle embolus with predominant proximal lower lobe pulmonary artery involvement 06/26/12  . Type II diabetes mellitus ([facility]    Past Surgical History:  Procedure Laterality Date  . CARDIAC CATHETERIZATION N/A 05/30/2016   Procedure: Left Heart Cath and Coronary Angiography;  Surgeon: Peter M JoMartiniqueMD;  [facility];  Service: Cardiovascular;  Laterality: N/A;  . CATARACT EXTRACTION W/ INTRAOCULAR LENS  IMPLANT, BILATERAL Bilateral 08/2013  . JOINT REPLACEMENT    . TONSILLECTOMY    . TOTAL KNEE ARTHROPLASTY Left 11/24/2012   Procedure: TOTAL KNEE ARTHROPLASTY;  Surgeon: FrKerin SalenMD;  [facility] Service: [facility];  Laterality: Left;  . TYMPANOSTOMY TUBE PLACEMENT Right early 2000s    Family History  Problem Relation Age of Onset  . Diabetes Mother   . Hypertension Mother   . Heart disease Father     MI age 76's .  Diabetes Brother   . Hypertension Brother    Social History:  reports that she quit smoking about 3 months ago. Her smoking use included Cigarettes. She has a 30.00 pack-year smoking history. She has never used smokeless tobacco. She reports that she does not drink alcohol or use drugs.  Allergies:  Allergies  Allergen Reactions  . Morphine And Related Other (See Comments)    Usually drops her blood pressure  . Cymbalta [Duloxetine Hcl] Other (See Comments)    Nausea, headache and  diarrhea   . Amoxicillin Other (See Comments)    Unknown Has patient had a PCN reaction causing immediate rash, facial/tongue/throat swelling, SOB or lightheadedness with hypotension:unsure Has patient had a PCN reaction causing severe rash involving mucus membranes or skin necrosis:unsure Has patient had a PCN reaction that required hospitalization:unsure Has patient had a PCN reaction occurring within the last 10 years:unsure If all of the above answers are "NO", then may proceed with Cephalosporin use.     Marland Kitchen Amoxicillin-Pot Clavulanate Other (See Comments)    Has patient had a PCN reaction causing immediate rash, facial/tongue/throat swelling, SOB or lightheadedness with hypotension:unsure Has patient had a PCN reaction causing severe rash involving mucus membranes or skin necrosis:unsure Has patient had a PCN reaction that required hospitalization:unsure Has patient had a PCN reaction occurring within the last 10 years:unsure If all of the above answers are "NO", then may proceed with Cephalosporin use.    . Citalopram Hydrobromide Itching       . Clonazepam Itching  . Macrobid [Nitrofurantoin Macrocrystal] Nausea And Vomiting  . Penicillins Other (See Comments)    Unknown Has patient had a PCN reaction causing immediate rash, facial/tongue/throat swelling, SOB or lightheadedness with hypotension:unsure Has patient had a PCN reaction causing severe rash involving mucus membranes or skin necrosis:unsure Has patient had a PCN reaction that required hospitalization:unsure Has patient had a PCN reaction occurring within the last 10 years:unsure If all of the above answers are "NO", then may proceed with Cephalosporin use.      . Pyridium [Phenazopyridine Hcl] Other (See Comments)    headache  . Sertraline Hcl Anxiety  . Wellbutrin [Bupropion] Itching, Rash and Other (See Comments)    Itching, rash, hyper   . Zonegran Other (See Comments)    Unknown   . Zonisamide Other  (See Comments)    Unknown     ROS: History obtained from spouse and the patient.  General ROS: negative for - chills, fatigue, fever, night sweats, weight gain or weight loss Psychological ROS: negative for - behavioral disorder, hallucinations, memory difficulties, mood swings or suicidal ideation Ophthalmic ROS: negative for - blurry vision, double vision, eye pain or loss of vision ENT ROS: negative for - epistaxis, nasal discharge, oral lesions, sore throat, tinnitus or vertigo Allergy and Immunology ROS: negative for - hives or itchy/watery eyes Hematological and Lymphatic ROS: negative for - bleeding problems, bruising or swollen lymph nodes Endocrine ROS: negative for - galactorrhea, hair pattern changes, polydipsia/polyuria or temperature intolerance Respiratory ROS: negative for - cough, hemoptysis, shortness of breath or wheezing Cardiovascular ROS: negative for - chest pain, dyspnea on exertion, edema or irregular heartbeat Gastrointestinal ROS: negative for - abdominal pain, diarrhea, hematemesis, nausea/vomiting or stool incontinence Genito-Urinary ROS: negative for - dysuria, hematuria, incontinence or urinary frequency/urgency Musculoskeletal ROS: Painful cramping of her feet over the past 2 days Neurological ROS: as noted in HPI Dermatological ROS: negative for rash and skin lesion changes  Physical Examination: Blood pressure 133/84,  pulse (!) 101, temperature 98.1 F (36.7 C), temperature source Oral, resp. rate 20, SpO2 100 %.  HEENT-  Normocephalic, no lesions, without obvious abnormality.  Normal external eye and conjunctiva.  Normal TM's bilaterally.  Normal auditory canals and external ears. Normal external nose, mucus membranes and septum.  Normal pharynx. Neck supple with no masses, nodes, nodules or enlargement. Cardiovascular - tachycardic, regular rhythm, normal S1 and S2, no murmur or gallop. Lungs - chest clear, no wheezing, rales, normal symmetric air  entry Abdomen - soft, non-tender; bowel sounds normal; no masses,  no organomegaly Extremities - no joint deformities, effusion, or inflammation  Neurologic Examination: Mental Status: Alert, oriented, no acute distress.  Speech fluent without evidence of aphasia. Able to follow commands without difficulty. Cranial Nerves: II-Visual fields were normal. III/IV/VI-Pupils were equal and reacted normally to light. Extraocular movements were full and conjugate.    V/VII-no facial numbness and no facial weakness. VIII-normal. X-normal speech and symmetrical palatal movement. XI: trapezius strength/neck flexion strength normal bilaterally XII-midline tongue extension with normal strength. Motor: 5/5 bilaterally with normal tone and bulk Sensory: Normal throughout. Deep Tendon Reflexes: 1+ and symmetric. Plantars: Mute bilaterally Cerebellar: Normal finger-to-nose testing. Carotid auscultation: Normal  Results for orders placed or performed during the hospital encounter of 06/19/16 (from the past 48 hour(s))  CBG monitoring, ED     Status: Abnormal   Collection Time: 06/19/16  8:07 PM  Result Value Ref Range   Glucose-Capillary 175 (H) 65 - 99 mg/dL  Protime-INR     Status: Abnormal   Collection Time: 06/19/16  8:59 PM  Result Value Ref Range   Prothrombin Time 15.4 (H) 11.4 - 15.2 seconds   INR 1.21   APTT     Status: None   Collection Time: 06/19/16  8:59 PM  Result Value Ref Range   aPTT 35 24 - 36 seconds  CBC     Status: Abnormal   Collection Time: 06/19/16  8:59 PM  Result Value Ref Range   WBC 10.3 4.0 - 10.5 K/uL   RBC 4.02 3.87 - 5.11 MIL/uL   Hemoglobin 10.9 (L) 12.0 - 15.0 g/dL   HCT 33.9 (L) 36.0 - 46.0 %   MCV 84.3 78.0 - 100.0 fL   MCH 27.1 26.0 - 34.0 pg   MCHC 32.2 30.0 - 36.0 g/dL   RDW 17.3 (H) 11.5 - 15.5 %   Platelets 483 (H) 150 - 400 K/uL  Differential     Status: None   Collection Time: 06/19/16  8:59 PM  Result Value Ref Range   Neutrophils Relative  % 74 %   Neutro Abs 7.7 1.7 - 7.7 K/uL   Lymphocytes Relative 16 %   Lymphs Abs 1.6 0.7 - 4.0 K/uL   Monocytes Relative 9 %   Monocytes Absolute 0.9 0.1 - 1.0 K/uL   Eosinophils Relative 1 %   Eosinophils Absolute 0.1 0.0 - 0.7 K/uL   Basophils Relative 0 %   Basophils Absolute 0.0 0.0 - 0.1 K/uL  Comprehensive metabolic panel     Status: Abnormal   Collection Time: 06/19/16  8:59 PM  Result Value Ref Range   Sodium 129 (L) 135 - 145 mmol/L   Potassium 3.0 (L) 3.5 - 5.1 mmol/L   Chloride 88 (L) 101 - 111 mmol/L   CO2 22 22 - 32 mmol/L   Glucose, Bld 206 (H) 65 - 99 mg/dL   BUN 6 6 - 20 mg/dL   Creatinine, Ser 0.93 0.44 -  1.00 mg/dL   Calcium 9.9 8.9 - 10.3 mg/dL   Total Protein 6.7 6.5 - 8.1 g/dL   Albumin 3.9 3.5 - 5.0 g/dL   AST 56 (H) 15 - 41 U/L   ALT 31 14 - 54 U/L   Alkaline Phosphatase 46 38 - 126 U/L   Total Bilirubin 0.6 0.3 - 1.2 mg/dL   GFR calc non Af Amer 58 (L) >60 mL/min   GFR calc Af Amer >60 >60 mL/min    Comment: (NOTE) The eGFR has been calculated using the CKD EPI equation. This calculation has not been validated in all clinical situations. eGFR's persistently <60 mL/min signify possible Chronic Kidney Disease.    Anion gap 19 (H) 5 - 15  I-stat troponin, ED (not at [facility], [facility])     Status: None   Collection Time: 06/19/16  9:13 PM  Result Value Ref Range   Troponin i, poc 0.01 0.00 - 0.08 ng/mL   Comment 3            Comment: Due to the release kinetics of cTnI, a negative result within the first hours of the onset of symptoms does not rule out myocardial infarction with certainty. If myocardial infarction is still suspected, repeat the test at appropriate intervals.   I-Stat Chem 8, ED  (not at [facility], [facility]     Status: Abnormal   Collection Time: 06/19/16  9:15 PM  Result Value Ref Range   Sodium 128 (L) 135 - 145 mmol/L   Potassium 3.0 (L) 3.5 - 5.1 mmol/L   Chloride 88 (L) 101 - 111 mmol/L   BUN 6 6 - 20 mg/dL   Creatinine, Ser 0.70 0.44 -  1.00 mg/dL   Glucose, Bld 186 (H) 65 - 99 mg/dL   Calcium, Ion 1.21 1.15 - 1.40 mmol/L   TCO2 23 0 - 100 mmol/L   Hemoglobin 13.3 12.0 - 15.0 g/dL   HCT 39.0 36.0 - 46.0 %  Ethanol     Status: None   Collection Time: 06/19/16  9:24 PM  Result Value Ref Range   Alcohol, Ethyl (B) <5 <5 mg/dL    Comment:        LOWEST DETECTABLE LIMIT FOR SERUM ALCOHOL IS 5 mg/dL FOR MEDICAL PURPOSES ONLY   Urine rapid drug screen (hosp performed)not at [facility]     Status: Abnormal   Collection Time: 06/19/16 11:06 PM  Result Value Ref Range   Opiates POSITIVE (A) NONE DETECTED   Cocaine NONE DETECTED NONE DETECTED   Benzodiazepines POSITIVE (A) NONE DETECTED   Amphetamines NONE DETECTED NONE DETECTED   Tetrahydrocannabinol NONE DETECTED NONE DETECTED   Barbiturates NONE DETECTED NONE DETECTED    Comment:        DRUG SCREEN FOR MEDICAL PURPOSES ONLY.  IF CONFIRMATION IS NEEDED FOR ANY PURPOSE, NOTIFY LAB WITHIN 5 DAYS.        LOWEST DETECTABLE LIMITS FOR URINE DRUG SCREEN Drug Class       Cutoff (ng/mL) Amphetamine      1000 Barbiturate      200 Benzodiazepine   338 Tricyclics       329 Opiates          300 Cocaine          300 THC              50   Urinalysis, Routine w reflex microscopic (not at [facility])     Status: None   Collection Time: 06/19/16 11:07 PM  Result Value Ref Range   Color, Urine YELLOW YELLOW   APPearance CLEAR CLEAR   Specific Gravity, Urine 1.007 1.005 - 1.030   pH 7.0 5.0 - 8.0   Glucose, UA NEGATIVE NEGATIVE mg/dL   Hgb urine dipstick NEGATIVE NEGATIVE   Bilirubin Urine NEGATIVE NEGATIVE   Ketones, ur NEGATIVE NEGATIVE mg/dL   Protein, ur NEGATIVE NEGATIVE mg/dL   Nitrite NEGATIVE NEGATIVE   Leukocytes, UA NEGATIVE NEGATIVE    Comment: MICROSCOPIC NOT DONE ON URINES WITH NEGATIVE PROTEIN, BLOOD, LEUKOCYTES, NITRITE, OR GLUCOSE <1000 mg/dL.  Protime-INR     Status: None   Collection Time: 06/20/16  3:54 AM  Result Value Ref Range   Prothrombin Time 15.0 11.4 -  15.2 seconds   INR 1.17   Heparin level (unfractionated)     Status: Abnormal   Collection Time: 06/20/16  3:54 AM  Result Value Ref Range   Heparin Unfractionated 0.20 (L) 0.30 - 0.70 IU/mL    Comment:        IF HEPARIN RESULTS ARE BELOW EXPECTED VALUES, AND PATIENT DOSAGE HAS BEEN CONFIRMED, SUGGEST FOLLOW UP TESTING OF ANTITHROMBIN III LEVELS.   Basic metabolic panel     Status: Abnormal   Collection Time: 06/20/16  8:38 AM  Result Value Ref Range   Sodium 134 (L) 135 - 145 mmol/L   Potassium 3.4 (L) 3.5 - 5.1 mmol/L   Chloride 99 (L) 101 - 111 mmol/L   CO2 24 22 - 32 mmol/L   Glucose, Bld 140 (H) 65 - 99 mg/dL   BUN <5 (L) 6 - 20 mg/dL   Creatinine, Ser 0.66 0.44 - 1.00 mg/dL   Calcium 8.9 8.9 - 10.3 mg/dL   GFR calc non Af Amer >60 >60 mL/min   GFR calc Af Amer >60 >60 mL/min    Comment: (NOTE) The eGFR has been calculated using the CKD EPI equation. This calculation has not been validated in all clinical situations. eGFR's persistently <60 mL/min signify possible Chronic Kidney Disease.    Anion gap 11 5 - 15  CBG monitoring, ED     Status: Abnormal   Collection Time: 06/20/16 12:20 PM  Result Value Ref Range   Glucose-Capillary 158 (H) 65 - 99 mg/dL   Comment 1 Notify RN   CBG monitoring, ED     Status: Abnormal   Collection Time: 06/20/16  2:51 PM  Result Value Ref Range   Glucose-Capillary 122 (H) 65 - 99 mg/dL  Magnesium     Status: Abnormal   Collection Time: 06/20/16  3:05 PM  Result Value Ref Range   Magnesium 1.1 (L) 1.7 - 2.4 mg/dL  CBG monitoring, ED     Status: Abnormal   Collection Time: 06/20/16  4:44 PM  Result Value Ref Range   Glucose-Capillary 119 (H) 65 - 99 mg/dL  Glucose, capillary     Status: Abnormal   Collection Time: 06/20/16  7:53 PM  Result Value Ref Range   Glucose-Capillary 119 (H) 65 - 99 mg/dL  Heparin level (unfractionated)     Status: Abnormal   Collection Time: 06/20/16  9:23 PM  Result Value Ref Range   Heparin  Unfractionated 0.20 (L) 0.30 - 0.70 IU/mL    Comment:        IF HEPARIN RESULTS ARE BELOW EXPECTED VALUES, AND PATIENT DOSAGE HAS BEEN CONFIRMED, SUGGEST FOLLOW UP TESTING OF ANTITHROMBIN III LEVELS.   Glucose, capillary     Status: Abnormal   Collection Time: 06/20/16 11:40 PM  Result Value Ref Range   Glucose-Capillary 108 (H) 65 - 99 mg/dL  Glucose, capillary     Status: Abnormal   Collection Time: 06/21/16  4:06 AM  Result Value Ref Range   Glucose-Capillary 127 (H) 65 - 99 mg/dL  CBC     Status: Abnormal   Collection Time: 06/21/16  4:27 AM  Result Value Ref Range   WBC 8.5 4.0 - 10.5 K/uL   RBC 3.13 (L) 3.87 - 5.11 MIL/uL   Hemoglobin 8.4 (L) 12.0 - 15.0 g/dL    Comment: REPEATED TO VERIFY SPECIMEN CHECKED FOR CLOTS DELTA CHECK NOTED    HCT 25.8 (L) 36.0 - 46.0 %   MCV 82.4 78.0 - 100.0 fL   MCH 26.8 26.0 - 34.0 pg   MCHC 32.6 30.0 - 36.0 g/dL   RDW 17.7 (H) 11.5 - 15.5 %   Platelets 342 150 - 400 K/uL  Heparin level (unfractionated)     Status: Abnormal   Collection Time: 06/21/16  4:27 AM  Result Value Ref Range   Heparin Unfractionated 1.04 (H) 0.30 - 0.70 IU/mL    Comment:        IF HEPARIN RESULTS ARE BELOW EXPECTED VALUES, AND PATIENT DOSAGE HAS BEEN CONFIRMED, SUGGEST FOLLOW UP TESTING OF ANTITHROMBIN III LEVELS.   Cortisol     Status: None   Collection Time: 06/21/16  4:27 AM  Result Value Ref Range   Cortisol, Plasma 15.3 ug/dL    Comment: (NOTE) AM    6.7 - 22.6 ug/dL PM   <10.0       ug/dL   Basic metabolic panel     Status: Abnormal   Collection Time: 06/21/16  4:27 AM  Result Value Ref Range   Sodium 137 135 - 145 mmol/L   Potassium 3.1 (L) 3.5 - 5.1 mmol/L   Chloride 107 101 - 111 mmol/L   CO2 22 22 - 32 mmol/L   Glucose, Bld 125 (H) 65 - 99 mg/dL   BUN <5 (L) 6 - 20 mg/dL   Creatinine, Ser 0.67 0.44 - 1.00 mg/dL   Calcium 7.7 (L) 8.9 - 10.3 mg/dL   GFR calc non Af Amer >60 >60 mL/min   GFR calc Af Amer >60 >60 mL/min    Comment:  (NOTE) The eGFR has been calculated using the CKD EPI equation. This calculation has not been validated in all clinical situations. eGFR's persistently <60 mL/min signify possible Chronic Kidney Disease.    Anion gap 8 5 - 15  MRSA PCR Screening     Status: None   Collection Time: 06/21/16  4:27 AM  Result Value Ref Range   MRSA by PCR NEGATIVE NEGATIVE    Comment:        The GeneXpert MRSA Assay (FDA approved for NASAL specimens only), is one component of a comprehensive MRSA colonization surveillance program. It is not intended to diagnose MRSA infection nor to guide or monitor treatment for MRSA infections.   Heparin level (unfractionated)     Status: None   Collection Time: 06/21/16  5:40 AM  Result Value Ref Range   Heparin Unfractionated 0.38 0.30 - 0.70 IU/mL    Comment:        IF HEPARIN RESULTS ARE BELOW EXPECTED VALUES, AND PATIENT DOSAGE HAS BEEN CONFIRMED, SUGGEST FOLLOW UP TESTING OF ANTITHROMBIN III LEVELS.   Glucose, capillary     Status: Abnormal   Collection Time: 06/21/16  8:24 AM  Result Value Ref Range   Glucose-Capillary 105 (  H) 65 - 99 mg/dL   Comment 1 Notify RN    Comment 2 Document in Chart    Ct Head Wo Contrast  Result Date: 06/19/2016 CLINICAL DATA:  75 year old female with high blood pressure and possible stroke. EXAM: CT HEAD WITHOUT CONTRAST TECHNIQUE: Contiguous axial images were obtained from the base of the skull through the vertex without intravenous contrast. COMPARISON:  None. FINDINGS: Brain: The ventricles and sulci are appropriate size for patient's age. Mild periventricular and deep white matter chronic microvascular ischemic changes noted. Areas of low attenuation involving the periventricular white matter and adjacent to the basal ganglia in the frontal lobe bilaterally are likely chronic and related to microvascular ischemic changes. Acute ischemia is less likely. MRI may provide better evaluation if there is high clinical concern  for acute infarct. There is no acute intracranial hemorrhage. No mass effect or midline shift noted. No extra-axial fluid collection. Vascular: No hyperdense vessel or unexpected calcification. Skull: Normal. Negative for fracture or focal lesion. Sinuses/Orbits: No acute finding. Other: None IMPRESSION: No acute intracranial hemorrhage. Mild age-related atrophy and chronic microvascular ischemic disease. If symptoms persist and there are no contraindications, MRI may provide better evaluation if clinically indicated Electronically Signed   By: Anner Crete M.D.   On: 06/19/2016 22:49   Mr Brain Wo Contrast  Result Date: 06/20/2016 CLINICAL DATA:  Initial evaluation for acute altered mental status. EXAM: MRI HEAD WITHOUT CONTRAST TECHNIQUE: Multiplanar, multiecho pulse sequences of the brain and surrounding structures were obtained without intravenous contrast. COMPARISON:  Prior CT from 06/19/2016. FINDINGS: Brain: Study mildly limited by motion artifact in the patient's inability to tolerate the full length of the exam. Axial T1 weighted sequence was not performed. Diffuse prominence of the CSF containing spaces is compatible with generalized age-related cerebral atrophy. Patchy and confluent T2/FLAIR hyperintensity within the periventricular and deep white matter both cerebral hemispheres most likely related to chronic microvascular ischemic disease, mild in nature. Probable small remote lacunar infarcts noted within the bilateral basal ganglia. Small remote bilateral cerebellar infarcts noted as well. No abnormal foci of restricted diffusion to suggest acute or subacute ischemia. Gray-white matter differentiation maintained. No acute or chronic intracranial hemorrhage. No mass lesion, midline shift, or mass effect. No hydrocephalus. No extra-axial fluid collection. Major dural sinuses are grossly patent. Pituitary gland within normal limits. Vascular: Major intracranial vascular flow voids are  preserved. Right vertebral artery hypoplastic. Skull and upper cervical spine: Craniocervical junction within normal limits. Visualized upper cervical spine unremarkable. Bone marrow signal intensity within normal limits. No scalp soft tissue abnormality. Sinuses/Orbits: Globes and orbital soft tissues grossly normal. Patient is status post lens extraction bilaterally. Mild scattered mucosal thickening within the ethmoidal air cells. Paranasal sinuses are otherwise clear. No mastoid effusion. Inner ear structures grossly normal. IMPRESSION: 1. No acute intracranial process identified. 2. 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 3. Generalized cerebral atrophy with mild chronic microvascular ischemic disease. Electronically Signed   By: Jeannine Boga M.D.   On: 06/20/2016 03:55   EEG Description: The patient is awake during the recording.  During maximal wakefulness, there is a symmetric, medium voltage 10 Hz posterior dominant rhythm that attenuates with eye opening.  The record is symmetric.  Stage 2 sleep is not seen.    At 00:14:20, there is onset of generalized 2 Hz slowing that evolves to generalized sharp waves occurring one per second and then evolves to generalized 3 Hz sharp  waves with possible bi-frontal preponderance, before ending at 00:39:00 followed by diffuse suppression of the background.  Clinically, at 00:14:30, patient is unresponsive, looking around the room with arms flexed and shaking.  At 00:37:00, her eyes are rolled back with grimace and chewing automatisms, right arm flexed and shaking and head and gaze to the left, before ending at 00:39:00.  She was given 1 mg Ativan at 00:37:12 and at 00:41:52  EKG lead was unremarkable.  Impression: This awake EEG is abnormal due to non-lateralizing seizure.  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  She is afebrile and infectious processes unlikely. MRI of the brain without contrast did not show  acute process. Seizure was witnessed yesterday while on EEG and 59m ativan was given. Patient is on xanax at home will continue as ativan IV.   Recommendations: 1. Repeat MRI of the brain w/contrast to rule out any metastatic disease: Patient declined 2. On continuous EEG patient has had multiple seizures since yesterday, Keppra increased and vimpat continued; now continue Keppra 1500 mg twice a day and Vimpat 100 mg twice a day. 3. Discussed lumbar puncture with patient and husband and they declined 4. Cramps in her lower extremities improved with electrolyte normalization, will follow with PT and OT and see how she does. 6. Patient is unable to drive, operate heavy machinery, perform activities at heights or participate in water activities until release by outpatient physician. This was discussed with the patient who expressed understanding.  7. Patient is on xanax at home will continue as ativan IV. She denies missing any doses whatsoever, we explained stopping or missing doses can cause withdrawal seizures she endorses compliance.  8. She needs outpatient follow up within 2-4 weeks of discharge. [facility] an excellent epileptologist and patient would benefit from seeing her given her area of specialty but I am also happy to see her as well. Most important si that she be seen in 2-4 weeks.   We will continue to follow this patient with you.  Personally examined patient and images and EEG recording and data, and have participated in and made any corrections needed to history, physical, neuro exam,assessment and plan as stated above.  I have personally obtained the history, evaluated lab date, reviewed imaging studies  and agree with radiology interpretations.   ASarina Ill MD [phone] A total of 45 minutes was spent face-to-face with this patient. Over half this time was spent on counseling patient on the seizure diagnosis and different diagnostic and therapeutic options available.       06/21/2016, 10:04 AM

## 2016-06-21 NOTE — Progress Notes (Signed)
EEG electrodes checked, no skin breakdown noted, no complaints from the patient about electrode sites.

## 2016-06-21 NOTE — Procedures (Signed)
LTM-EEG Report  HISTORY: Continuous video-EEG monitoring performed for 75 year old with COPD, hypertension, probable pancreatic cancer who presents with altered mental status and seizures. ACQUISITION: International 10-20 system for electrode placement; 18 channels with additional eyes linked to ipsilateral ears and EKG. Additional T1-T2 electrodes were used. Continuous video recording obtained.   EEG NUMBER:  MEDICATIONS:  Day 1: Keppra  DAY #1: from 1115 06/20/16 to 0800 06/21/16  BACKGROUND: An overall medium voltage continuous recording with good spontaneous variability and reactivity. Waking background consisted of a medium voltage 7-8 Hz posterior dominant rhythm bilaterally with low voltage beta activity in the bilateral frontocentral regions and some medium voltage theta activity diffusely. Sleep was captured with normal stage II sleep architecture. There were bursts of generalized slow activity occurring during wakefulness.  EPILEPTIFORM/PERIODIC ACTIVITY: runs of bifrontal sharp waves were seen during sleep after resolution of initial seizures SEIZURES: There were initially frequent prolonged subclinical or subtle clinical seizures in the initial part of this recording, involving bifrontal or left frontal rhythmic 2-3Hz  sharp waves with evolution in voltage and distribution with spread through the left hemisphere or to the right frontal region. Initial seizures occurred once an hour and lasted up to 20 minutes.  On video, the patient appeared to be confused but there are no other clear clinical signs. The seizures persisted until approximately 1820  But improved with increased anticonvulsant dosing. After 0100, fragments of subclinical seizures were seen with runs of bifrontal rhythmic sharp waves lasting a few minutes but without clear evolution. These occurred every few hours during sleep. After 0700, these fragments became longer and began showing evolution again, consistent with  subclinical seizures.  EVENTS: The button was pressed with one of the above-described seizures at approximately 1820 EKG: there were abnormal EKG depolarizations  SUMMARY: This was an abnormal continuous EEG due to background slow activity and frequent electrographic or subtle clinical seizures involving the bifrontal or left frontal regions.  These improved with increased anticonvulsants in the afternoon but recurred in the early morning hours of 06/21/16.  This was taken directly to the ordering provider in the afternoon of 06/20/16 and again on 06/21/16.

## 2016-06-22 ENCOUNTER — Telehealth: Payer: Self-pay | Admitting: Gastroenterology

## 2016-06-22 ENCOUNTER — Ambulatory Visit: Payer: Self-pay

## 2016-06-22 DIAGNOSIS — Z7901 Long term (current) use of anticoagulants: Secondary | ICD-10-CM

## 2016-06-22 DIAGNOSIS — I4891 Unspecified atrial fibrillation: Secondary | ICD-10-CM

## 2016-06-22 DIAGNOSIS — R933 Abnormal findings on diagnostic imaging of other parts of digestive tract: Secondary | ICD-10-CM

## 2016-06-22 DIAGNOSIS — G9340 Encephalopathy, unspecified: Secondary | ICD-10-CM

## 2016-06-22 DIAGNOSIS — E119 Type 2 diabetes mellitus without complications: Secondary | ICD-10-CM

## 2016-06-22 DIAGNOSIS — Z86718 Personal history of other venous thrombosis and embolism: Secondary | ICD-10-CM

## 2016-06-22 DIAGNOSIS — I10 Essential (primary) hypertension: Secondary | ICD-10-CM

## 2016-06-22 LAB — HEPARIN LEVEL (UNFRACTIONATED): Heparin Unfractionated: 0.49 IU/mL (ref 0.30–0.70)

## 2016-06-22 LAB — GLUCOSE, CAPILLARY
GLUCOSE-CAPILLARY: 149 mg/dL — AB (ref 65–99)
GLUCOSE-CAPILLARY: 144 mg/dL — AB (ref 65–99)
GLUCOSE-CAPILLARY: 150 mg/dL — AB (ref 65–99)
Glucose-Capillary: 124 mg/dL — ABNORMAL HIGH (ref 65–99)

## 2016-06-22 LAB — BASIC METABOLIC PANEL
ANION GAP: 4 — AB (ref 5–15)
BUN: 5 mg/dL — ABNORMAL LOW (ref 6–20)
CALCIUM: 8 mg/dL — AB (ref 8.9–10.3)
CHLORIDE: 110 mmol/L (ref 101–111)
CO2: 25 mmol/L (ref 22–32)
Creatinine, Ser: 0.64 mg/dL (ref 0.44–1.00)
GFR calc non Af Amer: >60 mL/min (ref >60)
GLUCOSE: 132 mg/dL — AB (ref 65–99)
Potassium: 3.4 mmol/L — ABNORMAL LOW (ref 3.5–5.1)
Sodium: 139 mmol/L (ref 135–145)

## 2016-06-22 LAB — MAGNESIUM: MAGNESIUM: 1.4 mg/dL — AB (ref 1.7–2.4)

## 2016-06-22 LAB — CBC
HCT: 26.1 % — ABNORMAL LOW (ref 36.0–46.0)
Hemoglobin: 8.4 g/dL — ABNORMAL LOW (ref 12.0–15.0)
MCH: 26.8 pg (ref 26.0–34.0)
MCHC: 32.2 g/dL (ref 30.0–36.0)
MCV: 83.4 fL (ref 78.0–100.0)
Platelets: 294 10*3/uL (ref 150–400)
RBC: 3.13 MIL/uL — ABNORMAL LOW (ref 3.87–5.11)
RDW: 17.8 % — ABNORMAL HIGH (ref 11.5–15.5)
WBC: 7.4 10*3/uL (ref 4.0–10.5)

## 2016-06-22 MED ORDER — POTASSIUM CHLORIDE CRYS ER 20 MEQ PO TBCR
30.0000 meq | EXTENDED_RELEASE_TABLET | Freq: Once | ORAL | Status: AC
  Administered 2016-06-22: 30 meq via ORAL
  Filled 2016-06-22: qty 1

## 2016-06-22 MED ORDER — LEVETIRACETAM 750 MG PO TABS
1500.0000 mg | ORAL_TABLET | Freq: Two times a day (BID) | ORAL | Status: DC
  Administered 2016-06-22 – 2016-06-23 (×3): 1500 mg via ORAL
  Filled 2016-06-22 (×3): qty 2

## 2016-06-22 MED ORDER — LACOSAMIDE 50 MG PO TABS
100.0000 mg | ORAL_TABLET | Freq: Two times a day (BID) | ORAL | Status: DC
  Administered 2016-06-22 – 2016-06-23 (×3): 100 mg via ORAL
  Filled 2016-06-22 (×3): qty 2

## 2016-06-22 NOTE — Care Management Important Message (Signed)
Important Message  Patient Details  Name: Sarah Mcguire MRN: [id number] Date of Birth: 09/08/1940   Medicare Important Message Given:  Yes    Erenest Rasher, RN 06/22/2016, 4:18 PM

## 2016-06-22 NOTE — Care Management Note (Signed)
Case Management Note  Patient Details  Name: Sarah Mcguire MRN: [id number] Date of Birth: 26-Aug-1941  Subjective/Objective:     AMS               Action/Plan: Discharge Planning: NCM spoke to pt and husband, Mr Bohne at bedside. Pt has home oxygen with [facility]. Instructed husband pt will need portable for home at dc. Has RW and bedside commode at home. Offered choice for [facility]. Pt states she had [facility] in the past. Contacted [facility] Liaison for [facility] PT and aide.   PCP- Midge Minium MD   Expected Discharge Date:              Expected Discharge Plan:  [place]  In-House Referral:  NA  Discharge planning Services  CM Consult  Post Acute Care Choice:  Home Health Choice offered to:  Patient, Spouse  DME Arranged:  N/A DME Agency:  NA  HH Arranged:  PT, Nurse's Aide [place] Agency:  [place]  Status of Service:  Completed, signed off  If discussed at [place] of Stay Meetings, dates discussed:    Additional Comments:  Erenest Rasher, RN 06/22/2016, 4:11 PM

## 2016-06-22 NOTE — Telephone Encounter (Signed)
Dan,  I actually went to [facility] to see her this morning. She was drowsy, did not want to talk much. I spoke with her husband at bedside. He says she does not want to think or talk about the cancer now. I told him this is likely pancreatic cancer, I'll be okay if she does not want biopsy. I discussed hospice with her husband, he says he will call when he needs help at home.   When your office call her on Monday, make sure she understands what to expect. Thanks.   Truitt Merle MD

## 2016-06-22 NOTE — Progress Notes (Signed)
ANTICOAGULATION CONSULT NOTE - Follow Up Consult  Pharmacy Consult for Heparin Indication: h/o PE/DVT and Afib  Assessment: 75 y.o. female admitted with AMS, h/o PE/DVT and Afib, on chronic coumadin which has been on hold since last week in anticipation of EUS and biopsy. Heparin level therapeutic on gtt at 1000 units/hr. CBC stable. No bleeding noted.   Goal of Therapy:  Heparin level 0.3-0.7 units/ml Monitor platelets by anticoagulation protocol: Yes   Plan:  Contiue heparin infusion 1000 units/hr F/u daily heparin level and CBC    Allergies  Allergen Reactions  . Morphine And Related Other (See Comments)    Usually drops her blood pressure  . Cymbalta [Duloxetine Hcl] Other (See Comments)    Nausea, headache and diarrhea   . Amoxicillin Other (See Comments)    Unknown Has patient had a PCN reaction causing immediate rash, facial/tongue/throat swelling, SOB or lightheadedness with hypotension:unsure Has patient had a PCN reaction causing severe rash involving mucus membranes or skin necrosis:unsure Has patient had a PCN reaction that required hospitalization:unsure Has patient had a PCN reaction occurring within the last 10 years:unsure If all of the above answers are "NO", then may proceed with Cephalosporin use.     Marland Kitchen Amoxicillin-Pot Clavulanate Other (See Comments)    Has patient had a PCN reaction causing immediate rash, facial/tongue/throat swelling, SOB or lightheadedness with hypotension:unsure Has patient had a PCN reaction causing severe rash involving mucus membranes or skin necrosis:unsure Has patient had a PCN reaction that required hospitalization:unsure Has patient had a PCN reaction occurring within the last 10 years:unsure If all of the above answers are "NO", then may proceed with Cephalosporin use.    . Citalopram Hydrobromide Itching       . Clonazepam Itching  . Macrobid [Nitrofurantoin Macrocrystal] Nausea And Vomiting  . Penicillins Other (See  Comments)    Unknown Has patient had a PCN reaction causing immediate rash, facial/tongue/throat swelling, SOB or lightheadedness with hypotension:unsure Has patient had a PCN reaction causing severe rash involving mucus membranes or skin necrosis:unsure Has patient had a PCN reaction that required hospitalization:unsure Has patient had a PCN reaction occurring within the last 10 years:unsure If all of the above answers are "NO", then may proceed with Cephalosporin use.      . Pyridium [Phenazopyridine Hcl] Other (See Comments)    headache  . Sertraline Hcl Anxiety  . Wellbutrin [Bupropion] Itching, Rash and Other (See Comments)    Itching, rash, hyper   . Zonegran Other (See Comments)    Unknown   . Zonisamide Other (See Comments)    Unknown     Patient Measurements:   Heparin Dosing Weight: 65 kg  Vital Signs: Temp: 98.6 F (37 C) (10/20 0410) Temp Source: Oral (10/20 0410) BP: 130/78 (10/20 0410) Pulse Rate: 94 (10/20 0410)  Labs:  Recent Labs  06/19/16 2059 06/19/16 2115 06/20/16 0354 06/20/16 [id number]  06/21/16 0427 06/21/16 0540 06/22/16 0437 06/22/16 0447  HGB 10.9* 13.3  --   --   --  8.4*  --  8.4*  --   HCT 33.9* 39.0  --   --   --  25.8*  --  26.1*  --   PLT 483*  --   --   --   --  342  --  294  --   APTT 35  --   --   --   --   --   --   --   --   LABPROT 15.4*  --  15.0  --   --   --   --   --   --   INR 1.21  --  1.17  --   --   --   --   --   --   HEPARINUNFRC  --   --  0.20*  --   < > 1.04* 0.38  --  0.49  CREATININE 0.93 0.70  --  0.66  --  0.67  --   --   --   < > = values in this interval not displayed.  Estimated Creatinine Clearance: 58 mL/min (by C-G formula based on SCr of 0.67 mg/dL).  Maryanna Shape, PharmD, BCPS  Clinical Pharmacist  Pager: [phone]   06/22/2016 5:51 AM

## 2016-06-22 NOTE — Telephone Encounter (Signed)
Dr Ardis Hughs I will wait to set her up until I speak with her on Monday.

## 2016-06-22 NOTE — Progress Notes (Signed)
Subjective: Husband notes no further seizures.   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  She is afebrile and infectious processes unlikely. MRI of the brain without contrast did not show  acute process. Patient refusing MRI with contrast. Currently doing well. If over night EEG negative will stop EEG and continue current doses of AED. Will need to follow up with Dr. Delice Lesch.      06/22/2016, 9:09 AM

## 2016-06-22 NOTE — Telephone Encounter (Signed)
I understand that Sarah Mcguire is readmitted for diverticulitis and has explained that she missed her EUS appt.  Please see phone note from earlier this week. Sarah Mcguire cancelled the EUS FNA appointment and also cancelled her oncology appointment.  My office and Dr. Morey Hummingbird tried to convince her otherwise without success.  She now want to go ahead with EUS as an outpatient and we will reschedule the appointment for next week. She is likely being discharged tomorrow and the hospitalist team is planning to tell her to stop taking her coumadin until the biopsy appointment.  We will contact her on Monday at home to finalize the details of her EUS, remind her not to take blood thinner.    I will forward this to her inpatient team; it is essential that she clearly understand she is not to take coumadin for 5 days prior to the EUS.  Sarah Mcguire, Please schedule for EUS on Thursday 26th with MAC sedation. Call her Monday to confirm, she should be home by then.  Remind her about holding coumadin  Sarah Mcguire, FYI: I'll let you know about final path.  Thanks

## 2016-06-22 NOTE — Evaluation (Signed)
Clinical/Bedside Swallow Evaluation Patient Details  Name: Sarah Mcguire MRN: [id number] Date of Birth: 1941-08-05  Today's Date: 06/22/2016 Time: SLP Start Time (ACUTE ONLY): [id number] SLP Stop Time (ACUTE ONLY): 1635 SLP Time Calculation (min) (ACUTE ONLY): 20 min  Past Medical History:  Past Medical History:  Diagnosis Date  . Anemia   . Anxiety    SEVERE  . Arthritis    "probably in my back" (05/08/2016)  . Atrial fibrillation with RVR ([place]) 11/28/2012  . Chronic lower back pain   . Coronary artery disease   . Diverticulitis   . DVT (deep venous thrombosis) ([place])   . Emphysema of lung ([place])   . GERD (gastroesophageal reflux disease)   . High cholesterol   . Hyperglycemia   . Hyperlipidemia   . Hypertension   . Impaired mobility    ambulates with walker.  . Leukocytosis   . Multiple drug allergies   . Neuropathy ([place])   . On home oxygen therapy    "2L; 24/7 prn" (05/08/2016)  . PE (pulmonary embolism)    Bilateral PE, stringy saddle embolus with predominant proximal lower lobe pulmonary artery involvement 06/26/12  . Type II diabetes mellitus ([place])    Past Surgical History:  Past Surgical History:  Procedure Laterality Date  . CARDIAC CATHETERIZATION N/A 05/30/2016   Procedure: Left Heart Cath and Coronary Angiography;  Surgeon: Peter M Martinique, MD;  Location: [facility];  Service: Cardiovascular;  Laterality: N/A;  . CATARACT EXTRACTION W/ INTRAOCULAR LENS  IMPLANT, BILATERAL Bilateral 08/2013  . JOINT REPLACEMENT    . TONSILLECTOMY    . TOTAL KNEE ARTHROPLASTY Left 11/24/2012   Procedure: TOTAL KNEE ARTHROPLASTY;  Surgeon: Kerin Salen, MD;  Location: [place] [facility];  Service: Orthopedics;  Laterality: Left;  . TYMPANOSTOMY TUBE PLACEMENT Right early 2000s   HPI:  Pt is a 75 y.o. 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 Per MD note, "uncertain if seizure activity vs panic attack". 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 No CXR on this admission. Bedside swallow eval ordered.    Assessment / Plan / Recommendation Clinical Impression  Pt with no overt s/s of aspiration at bedside. Cognitive status improved from previous date per husband. Husband reports pt has difficulty swallowing large pills. Reviewed possible compensatory strategies- take pill with puree, cut in half, chin tuck to swallow, etc. Pt does not like applesauce but may benefit from pills in a different puree such as pudding or yogurt. Aspiration risk appears mild. Pt does have hx of reflux, so precautions would be beneficial- small bites/ sips, sit upright 30-60 minutes after meal, alternate food with liquid. Will sign off at this time; please re-consult if needs arise.    Aspiration Risk  Mild aspiration risk    Diet Recommendation Regular;Thin liquid   Liquid Administration via: Cup;Straw Medication Administration: Whole meds with puree (or cut in half if possible) Supervision: Patient able to self feed;Intermittent supervision to cue for compensatory strategies Compensations: Slow rate;Small sips/bites;Follow solids with liquid Postural Changes: Seated upright at 90 degrees;Remain upright for at least 30 minutes after po intake    Other  Recommendations Oral Care Recommendations: Oral care BID   Follow up Recommendations None      Frequency and Duration            Prognosis  Swallow Study   General HPI: Pt is a 75 y.o. 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 Per MD note, "uncertain if seizure activity vs panic attack". 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 No CXR on this admission. Bedside  swallow eval ordered.  Type of Study: Bedside Swallow Evaluation Previous Swallow Assessment: none in chart Diet Prior to this Study: NPO Temperature Spikes Noted: No Respiratory Status: Room air History of Recent Intubation: No Behavior/Cognition: Alert;Cooperative;Lethargic/Drowsy Oral Cavity Assessment: Within Functional Limits Oral Care Completed by SLP: No Oral Cavity - Dentition: Adequate natural dentition Vision: Functional for self-feeding Self-Feeding Abilities: Able to feed self Patient Positioning: Upright in bed Baseline Vocal Quality: Normal Volitional Cough: Strong    Oral/Motor/Sensory Function Overall Oral Motor/Sensory Function: Within functional limits   Ice Chips Ice chips: Not tested   Thin Liquid Thin Liquid: Within functional limits Presentation: Cup;Straw    Nectar Thick Nectar Thick Liquid: Not tested   Honey Thick Honey Thick Liquid: Not tested   Puree Puree: Within functional limits Presentation: Spoon   Solid   GO   Solid: Within functional limits Presentation: Sarah Mcguire, Sarah Ekstrand K, MA, CCC-SLP 06/22/2016,4:44 PM [phone]

## 2016-06-22 NOTE — Telephone Encounter (Signed)
Also, it seem that it would be very reasonable to keep her in the hospital until the time of the biopsy next week since she has already cancelled the test once.  I will leave that to her inpatient team.

## 2016-06-22 NOTE — Progress Notes (Signed)
Sarah Mcguire   DOB:26-Mar-1941   [id number]   [id number]  Oncology follow up   Subjective: I saw Mrs. Bagg on her prior admission for her pancreatic mass. She was recently admitted a few days ago for what Estill Bamberg status. I saw her in the morning, she was very drowsy, did not want to talk, I spoke with her husband at bed side.    Objective:  Vitals:   06/22/16 1500 06/22/16 1848  BP: 104/86 124/68  Pulse: 90 87  Resp: 20 (!) 22  Temp: 98 F (36.7 C) 98.3 F (36.8 C)    There is no height or weight on file to calculate BMI.  Intake/Output Summary (Last 24 hours) at 06/22/16 2039 Last data filed at 06/22/16 1900  Gross per 24 hour  Intake          2403.67 ml  Output                0 ml  Net          2403.67 ml     Sclerae unicteric  Oropharynx clear  No peripheral adenopathy  Lungs clear -- no rales or rhonchi  Heart regular rate and rhythm  Abdomen benign  MSK no focal spinal tenderness, no peripheral edema  Neuro nonfocal    CBG (last 3)   Recent Labs  06/22/16 0744 06/22/16 1128 06/22/16 1742  GLUCAP 124* 149* 144*     Labs:  CBC Latest Ref Rng & Units 06/22/2016 06/21/2016 06/19/2016  WBC 4.0 - 10.5 K/uL 7.4 8.5 -  Hemoglobin 12.0 - 15.0 g/dL 8.4(L) 8.4(L) 13.3  Hematocrit 36.0 - 46.0 % 26.1(L) 25.8(L) 39.0  Platelets 150 - 400 K/uL 294 342 -    CMP Latest Ref Rng & Units 06/22/2016 06/21/2016 06/20/2016  Glucose 65 - 99 mg/dL 132(H) 125(H) 140(H)  BUN 6 - 20 mg/dL 5(L) <5(L) <5(L)  Creatinine 0.44 - 1.00 mg/dL 0.64 0.67 0.66  Sodium 135 - 145 mmol/L 139 137 134(L)  Potassium 3.5 - 5.1 mmol/L 3.4(L) 3.1(L) 3.4(L)  Chloride 101 - 111 mmol/L 110 107 99(L)  CO2 22 - 32 mmol/L 25 22 24   Calcium 8.9 - 10.3 mg/dL 8.0(L) 7.7(L) 8.9  Total Protein 6.5 - 8.1 g/dL - - -  Total Bilirubin 0.3 - 1.2 mg/dL - - -  Alkaline Phos 38 - 126 U/L - - -  AST 15 - 41 U/L - - -  ALT 14 - 54 U/L - - -   Urine Studies No results for input(s): UHGB, CRYS in the  last 72 hours.  Invalid input(s): UACOL, UAPR, USPG, UPH, UTP, UGL, UKET, UBIL, UNIT, UROB, ULEU, UEPI, UWBC, Junie Panning [place], [place], [place]  Basic Metabolic Panel:  Recent Labs Lab 06/19/16 2059 06/19/16 2115 06/20/16 [id number] 06/20/16 1505 06/21/16 0427 06/22/16 0437 06/22/16 0917  NA 129* 128* 134*  --  137 139  --   K 3.0* 3.0* 3.4*  --  3.1* 3.4*  --   CL 88* 88* 99*  --  107 110  --   CO2 22  --  24  --  22 25  --   GLUCOSE 206* 186* 140*  --  125* 132*  --   BUN 6 6 <5*  --  <5* 5*  --   CREATININE 0.93 0.70 0.66  --  0.67 0.64  --   CALCIUM 9.9  --  8.9  --  7.7* 8.0*  --   MG  --   --   --  1.1*  --   --  1.4*   GFR Estimated Creatinine Clearance: 58 mL/min (by C-G formula based on SCr of 0.64 mg/dL). Liver Function Tests:  Recent Labs Lab 06/19/16 2059  AST 56*  ALT 31  ALKPHOS 46  BILITOT 0.6  PROT 6.7  ALBUMIN 3.9   No results for input(s): LIPASE, AMYLASE in the last 168 hours. No results for input(s): AMMONIA in the last 168 hours. Coagulation profile  Recent Labs Lab 06/19/16 2059 06/20/16 0354  INR 1.21 1.17    CBC:  Recent Labs Lab 06/18/16 0814 06/19/16 2059 06/19/16 2115 06/21/16 0427 06/22/16 0437  WBC  --  10.3  --  8.5 7.4  NEUTROABS  --  7.7  --   --   --   HGB 13.3 10.9* 13.3 8.4* 8.4*  HCT 39.0 33.9* 39.0 25.8* 26.1*  MCV  --  84.3  --  82.4 83.4  PLT  --  483*  --  342 294   Cardiac Enzymes: No results for input(s): CKTOTAL, CKMB, CKMBINDEX, TROPONINI in the last 168 hours. BNP: Invalid input(s): POCBNP CBG:  Recent Labs Lab 06/21/16 1822 06/21/16 2051 06/22/16 0744 06/22/16 1128 06/22/16 1742  GLUCAP 94 159* 124* 149* 144*   D-Dimer No results for input(s): DDIMER in the last 72 hours. Hgb A1c No results for input(s): HGBA1C in the last 72 hours. Lipid Profile No results for input(s): CHOL, HDL, LDLCALC, TRIG, CHOLHDL, LDLDIRECT in the last 72 hours. Thyroid function studies No results for input(s): TSH,  T4TOTAL, T3FREE, THYROIDAB in the last 72 hours.  Invalid input(s): FREET3 Anemia work up No results for input(s): VITAMINB12, FOLATE, FERRITIN, TIBC, IRON, RETICCTPCT in the last 72 hours. Microbiology Recent Results (from the past 240 hour(s))  MRSA PCR Screening     Status: None   Collection Time: 06/21/16  4:27 AM  Result Value Ref Range Status   MRSA by PCR NEGATIVE NEGATIVE Final    Comment:        The GeneXpert MRSA Assay (FDA approved for NASAL specimens only), is one component of a comprehensive MRSA colonization surveillance program. It is not intended to diagnose MRSA infection nor to guide or monitor treatment for MRSA infections.       Studies:  No results found.  Assessment: 75 y.o.  Caucasian female, with past medical history of hypertension, diabetes, atrial fibrillation, DVT, on Coumadin, presented with abdominal pain and fever,   1. 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 Encephalopathy, versus seizures 3. Type 2 DM 4. HTN 5. AF 6. History of DVT, on coumadin    Recommendations: -The patient was very drowsy, and did not want to talk when I saw her this morning. I reviewed her imaging findings and significantly elevated CA 19.9 with patient's husband. 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 -I again reviewed the pancreatic biopsy with EUS with patient's husband, she canceled the procedure last week. Husband was not sure if she wants to pursue the biopsy. -She is clearly not a candidate for cytotoxic chemotherapy, I don't think I have much to offer her regarding the pancreatic cancer treatment.  -both pt and her husband have very limited understanding about her cancer diagnosis work up and prognosis, despite my repeated explanation.  -I told her husband that the biopsy will confirm the diagnosis, but may not change our management, I do not feel the biopsy is  absolutely necessary due to her multiple medical comorbidities. -  In my opinion, she is hospice appropriate, her life expectancy will likely be a few months. I discussed with her husband, and recommend him to consider hospice. He is not sure what they wants to do, and declined.  -I will follow up as needed. Her husband knows to call me if needed.    Truitt Merle, MD 06/22/2016  10am   Addendum Per pt's request, I called her and her husband back from my office in late afternoon. She apparently requested to schedule her biopsy for next week, but does not know what to anticipate. I explained the procedure to my best knowledge, and told her that Dr. Ardis Hughs office will call her and instruct her about the procedure on Monday.   Truitt Merle  06/22/2016 6pm

## 2016-06-22 NOTE — Progress Notes (Signed)
LTM removed; no skin breakdown was seen. Dr Deborah Chalk was notified.

## 2016-06-22 NOTE — Progress Notes (Signed)
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   Assessment & Plan: Principal Problem:   Altered mental status Active Problems:   Diabetes mellitus with neuropathy (HCC)   Anemia   Anxiety and depression   Essential hypertension   Long term current use of anticoagulant therapy   Foot pain   Atrial fibrillation (HCC)   Hypothyroidism   COPD (chronic obstructive pulmonary disease) (HCC)   Chronic systolic CHF (congestive heart failure) (HCC)   Pancreatic mass   Diverticulitis   Hyponatremia   Seizures (HCC)   Seizures - unclear etiology, would ideally like an MRI with contrast and LP however patient and her husband are refusing these tests - on 2 AED, clinically she is improving and alert and at baseline per her husband - defer to neurology need for ongoing continuous EEG and when she could be converted to po AED  Anxiety - significant underlying anxiety patient taking Xanax 6 times daily, postentially playing a role in her seizures if she missed doses at home - continue Ativan scheduled for now  Leg cramps  - suspect due to kypokalemia / kypomagnesemia, improved with repletion  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 - GI to see as an outpatient for EGD / EUS, discussed with Wallace Going, NP, it appears that she will be scheduled again for procedure next Thursday as an outpatient   Atrial fibrillation - patient's CHA2DS2-VASc Score for Stroke Risk is at least 2 - on heparin, will likely need to stop Coumadin on d/c as biopsy planned next week  Chronic combined systolic and diastolic CHF - recent cath Sept 2017 with minor nonobstructive CAD - euvolemic for  now - discontinue fluids now, she is eating  DM - continue SSI  Recent diverticulitis - patient is asymptomatic from GI standpoint without abdominal pain, nausea/vomiting - continue Ciprofloxacin and metronidazole  Fever - 10/8, ideally would have wanted an LP however patient and husband are refusing. No HA, no neck pain / photophobia / nausea so very low suspicion for meningitis - she has no abdominal complaints to suggest abscess formation or worsening diverticulitis - fever resolved now   DVT prophylaxis: heparin gtt Code Status: Full Family Communication: d/w husband bedside Disposition Plan: remain inpatient, home 24-48   Consultants:   Neurology   GI (phone)  Procedures:   EEG  Antimicrobials:  Ciprofloxacin / Metronidazole    Subjective: - no chest pain, shortness of breath, no abdominal pain, nausea or vomiting. She is alert, appears less confused today  Objective: Vitals:   06/21/16 1900 06/21/16 2359 06/22/16 0410 06/22/16 0700  BP: 117/89 122/67 130/78 130/85  Pulse: 99 (!) 104 94 99  Resp: (!) 22 (!) 21 16 19   Temp: 97.9 F (36.6 C) 98.7 F (37.1 C) 98.6 F (37 C) 98.9 F (37.2 C)  TempSrc: Oral Oral Oral Oral  SpO2: 97% 96% 99% 98%    Intake/Output Summary (Last 24 hours) at 06/22/16 1127 Last data filed at 06/22/16 0900  Gross per 24 hour  Intake          1878.67 ml  Output                0 ml  Net  1878.67 ml   There were no vitals filed for this visit.  Examination: Constitutional: NAD Vitals:   06/21/16 1900 06/21/16 2359 06/22/16 0410 06/22/16 0700  BP: 117/89 122/67 130/78 130/85  Pulse: 99 (!) 104 94 99  Resp: (!) 22 (!) 21 16 19   Temp: 97.9 F (36.6 C) 98.7 F (37.1 C) 98.6 F (37 C) 98.9 F (37.2 C)  TempSrc: Oral Oral Oral Oral  SpO2: 97% 96% 99% 98%   Eyes: PERRL, lids and conjunctivae normal Respiratory: clear to auscultation bilaterally, no wheezing, no crackles. Cardiovascular: Regular rate and rhythm,  no murmurs / rubs / gallops.  Abdomen: no tenderness. Bowel sounds positive.  Musculoskeletal: no clubbing / cyanosis.  Skin: no rashes, lesions, ulcers. No induration Neurologic: non focal   Data Reviewed: I have personally reviewed following labs and imaging studies  CBC:  Recent Labs Lab 06/15/16 1951 06/18/16 0814 06/19/16 2059 06/19/16 2115 06/21/16 0427 06/22/16 0437  WBC 9.3  --  10.3  --  8.5 7.4  NEUTROABS  --   --  7.7  --   --   --   HGB 10.8* 13.3 10.9* 13.3 8.4* 8.4*  HCT 33.3* 39.0 33.9* 39.0 25.8* 26.1*  MCV 82.8  --  84.3  --  82.4 83.4  PLT 446*  --  483*  --  342 [id number]   Basic Metabolic Panel:  Recent Labs Lab 06/15/16 1951  06/19/16 2059 06/19/16 2115 06/20/16 0838 06/20/16 1505 06/21/16 0427 06/22/16 0437  NA 129*  < > 129* 128* 134*  --  137 139  K 3.9  < > 3.0* 3.0* 3.4*  --  3.1* 3.4*  CL 92*  < > 88* 88* 99*  --  107 110  CO2 23  --  22  --  24  --  22 25  GLUCOSE 118*  < > 206* 186* 140*  --  125* 132*  BUN 8  < > 6 6 <5*  --  <5* 5*  CREATININE 0.85  < > 0.93 0.70 0.66  --  0.67 0.64  CALCIUM 10.2  --  9.9  --  8.9  --  7.7* 8.0*  MG  --   --   --   --   --  1.1*  --   --   < > = values in this interval not displayed. GFR: Estimated Creatinine Clearance: 58 mL/min (by C-G formula based on SCr of 0.64 mg/dL). Liver Function Tests:  Recent Labs Lab 06/15/16 1951 06/19/16 2059  AST 25 56*  ALT 12* 31  ALKPHOS 43 46  BILITOT 0.3 0.6  PROT 6.6 6.7  ALBUMIN 3.9 3.9    Recent Labs Lab 06/15/16 1951  LIPASE 17   No results for input(s): AMMONIA in the last 168 hours. Coagulation Profile:  Recent Labs Lab 06/19/16 2059 06/20/16 0354  INR 1.21 1.17   CBG:  Recent Labs Lab 06/21/16 0824 06/21/16 1304 06/21/16 1822 06/21/16 2051 06/22/16 0744  GLUCAP 105* 179* 94 159* 124*   Urine analysis:    Component Value Date/Time   COLORURINE YELLOW 06/19/2016 2307   APPEARANCEUR CLEAR 06/19/2016 2307   LABSPEC 1.007  06/19/2016 2307   PHURINE 7.0 06/19/2016 2307   GLUCOSEU NEGATIVE 06/19/2016 2307   HGBUR NEGATIVE 06/19/2016 2307   HGBUR negative 10/24/2010 [place] 06/19/2016 2307   BILIRUBINUR negative 06/04/2016 [place] 06/19/2016 2307   PROTEINUR NEGATIVE 06/19/2016 2307   UROBILINOGEN 0.2 06/04/2016 1108  UROBILINOGEN 0.2 04/04/2015 1328   NITRITE NEGATIVE 06/19/2016 2307   LEUKOCYTESUR NEGATIVE 06/19/2016 2307   Sepsis Labs: Invalid input(s): PROCALCITONIN, LACTICIDVEN  Recent Results (from the past 240 hour(s))  MRSA PCR Screening     Status: None   Collection Time: 06/21/16  4:27 AM  Result Value Ref Range Status   MRSA by PCR NEGATIVE NEGATIVE Final    Comment:        The GeneXpert MRSA Assay (FDA approved for NASAL specimens only), is one component of a comprehensive MRSA colonization surveillance program. It is not intended to diagnose MRSA infection nor to guide or monitor treatment for MRSA infections.       Radiology Studies: No results found.   Scheduled Meds: . ciprofloxacin  400 mg Intravenous Q12H  . famotidine (PEPCID) IV  20 mg Intravenous Q12H  . fluticasone furoate-vilanterol  1 puff Inhalation Daily  . insulin aspart  0-9 Units Subcutaneous TID WC  . lacosamide  100 mg Oral BID  . levETIRAcetam  1,500 mg Oral BID  . levothyroxine  25 mcg Intravenous Daily  . LORazepam  0.5 mg Intravenous Q6H  . metoprolol succinate  25 mg Oral Daily  . metroNIDAZOLE  500 mg Oral Q8H  . sertraline  25 mg Oral QHS  . simvastatin  20 mg Oral QPM  . sodium chloride flush  10-40 mL Intracatheter Q12H  . sodium chloride flush  3 mL Intravenous Q12H   Continuous Infusions: . sodium chloride 75 mL/hr at 06/21/16 1900  . heparin 1,000 Units/hr (06/22/16 0925)     Marzetta Board, MD, PhD [facility] Pager [phone]  If 7PM-7AM, please contact night-coverage [email or web address] Password [email or web address] 06/22/2016, 11:27 AM

## 2016-06-22 NOTE — Telephone Encounter (Signed)
Got it, thanks  DJ

## 2016-06-22 NOTE — Consult Note (Signed)
   [facility] CM Inpatient Consult   06/22/2016  Sarah Mcguire [birth year] [id number]   Came to visit patient at bedside. However, nursing at bedside. Will follow up at later time. [name] recently signed up with [employer] program. She is currently in stepdown unit. Will follow up at later time. Will make inpatient RNCM aware that [facility] is active.    Marthenia Rolling, MSN-Ed, RN,BSN [facility] Liaison [phone]

## 2016-06-22 NOTE — Evaluation (Signed)
Physical Therapy Evaluation Patient Details Name: Sarah Mcguire MRN: [id number] DOB: 03/07/1941 Today's Date: 06/22/2016   History of Present Illness  75 y.o. female admitted to [facility] on 06/19/16 for seizure activity.  Pt with significant PMHx of DM, PE, on home O2 at baseline, neuropathy, HTN, DVT, diverticulitis, CAD, chronic low back pain, A-fib with RVR, anxiety, anemai, and L TKA.  Clinical Impression  Pt was able to walk with RW with mod assist for balance (posterior lean during transitions and gait).  She is weak and would require constant hands on assist at discharge.  [facility] services would be appropriate as well as a PT session before possible d/c home tomorrow.  I had the pt's husband guard her during part of gait to see how much assistance she required.  It might be good to do this again tomorrow with all of her mobility to make sure he feels he can handle her in this weakened state.   PT to follow acutely for deficits listed below.       Follow Up Recommendations Home health PT;Supervision/Assistance - 24 hour;Other (comment) ([employer] aide)    Equipment Recommendations  None recommended by PT    Recommendations for Other Services   NA    Precautions / Restrictions Precautions Precautions: Fall Precaution Comments: pt is very unsteady on her feet and tends to lean posteriorly.       Mobility  Bed Mobility Overal bed mobility: Needs Assistance Bed Mobility: Supine to Sit     Supine to sit: Mod assist     General bed mobility comments: HOB flat and did not allow pt to use rail due to no rail and flat bed at home.  Pt did not initiate trying to pull up to sitting EOB and almost let PT lift her trunk up dependently.   Transfers Overall transfer level: Needs assistance   Transfers: Sit to/from Stand Sit to Stand: Mod assist         General transfer comment: Mod assist to support trunk to get to standing with verbal cues for safe hand placement.  Pt with posterior  preference throughout transitions.   Ambulation/Gait Ambulation/Gait assistance: Mod assist Ambulation Distance (Feet): 75 Feet Assistive device: Rolling walker (2 wheeled) Gait Pattern/deviations: Step-through pattern;Shuffle;Leaning posteriorly Gait velocity: decreased Gait velocity interpretation: Below normal speed for age/gender General Gait Details: pt with slow, shuffling gait speed.  Pt with posterior lean and LOB during gait. Verbal cues for safe RW use and closer proximity to RW.  Increased assistance as pt fatigued.          Balance Overall balance assessment: Needs assistance Sitting-balance support: Feet supported;No upper extremity supported Sitting balance-Leahy Scale: Poor Sitting balance - Comments: Min assist EOB to prevent posterior LOB.  Postural control: Posterior lean Standing balance support: Bilateral upper extremity supported Standing balance-Leahy Scale: Poor                               Pertinent Vitals/Pain Pain Assessment: No/denies pain    Home Living Family/patient expects to be discharged to:: Private residence Living Arrangements: Spouse/significant other Available Help at Discharge: Family;Available 24 hours/day Type of Home: House Home Access: Stairs to enter Entrance Stairs-Rails: None Entrance Stairs-Number of Steps: 1 Home Layout: One level Home Equipment: Walker - 2 wheels;Bedside commode      Prior Function Level of Independence: Needs assistance   Gait / Transfers Assistance Needed: husband assists with mobilizing around  home PTA and for ~1 week she has not been out of the bed walking at all   ADL's / Homemaking Assistance Needed: husband assists with bathing, dressing           Extremity/Trunk Assessment   Upper Extremity Assessment: Generalized weakness           Lower Extremity Assessment: Generalized weakness      Cervical / Trunk Assessment: Kyphotic  Communication   Communication: HOH   Cognition Arousal/Alertness: Awake/alert Behavior During Therapy: Flat affect Overall Cognitive Status: Impaired/Different from baseline Area of Impairment: Problem solving             Problem Solving: Slow processing;Decreased initiation;Difficulty sequencing;Requires verbal cues;Requires tactile cues General Comments: Pt is slow to process (vs HOH), had difficulty intiating movement or sequencing even getting to EOB.        Exercises General Exercises - Lower Extremity Long Arc Quad: AROM;Both;10 reps;Seated Hip ABduction/ADduction: AROM;Both;10 reps;Seated (adduction against pillow) Hip Flexion/Marching: AROM;Both;20 reps;Seated Toe Raises: AROM;Both;10 reps;Seated Heel Raises: AROM;Both;10 reps;Seated Mini-Sqauts: AROM;Both;10 reps;Seated   Assessment/Plan    PT Assessment Patient needs continued PT services  PT Problem List Decreased strength;Decreased activity tolerance;Decreased balance;Decreased mobility;Decreased cognition;Decreased knowledge of use of DME;Decreased safety awareness;Impaired sensation;Obesity;Decreased knowledge of precautions          PT Treatment Interventions DME instruction;Gait training;Stair training;Functional mobility training;Therapeutic activities;Therapeutic exercise;Balance training;Neuromuscular re-education;Cognitive remediation;Patient/family education    PT Goals (Current goals can be found in the Care Plan section)  Acute Rehab PT Goals Patient Stated Goal: husband wants her to walk enough to go home PT Goal Formulation: With patient/family Time For Goal Achievement: 07/06/16 Potential to Achieve Goals: Good    Frequency Min 3X/week           End of Session Equipment Utilized During Treatment: Gait belt;Oxygen Activity Tolerance: Patient limited by fatigue Patient left: in chair;with call bell/phone within reach;with family/visitor present Nurse Communication: Mobility status         Time: 1440-1530 PT Time  Calculation (min) (ACUTE ONLY): 50 min   Charges:   PT Evaluation $PT Eval Moderate Complexity: 1 Procedure PT Treatments $Gait Training: 23-37 mins        Keerthana Vanrossum B. Clester Chlebowski, PT, DPT [phone]   06/22/2016, 3:47 PM

## 2016-06-23 DIAGNOSIS — C786 Secondary malignant neoplasm of retroperitoneum and peritoneum: Secondary | ICD-10-CM | POA: Diagnosis not present

## 2016-06-23 LAB — BASIC METABOLIC PANEL
Anion gap: 5 (ref 5–15)
BUN: 7 mg/dL (ref 6–20)
CHLORIDE: 109 mmol/L (ref 101–111)
CO2: 25 mmol/L (ref 22–32)
CREATININE: 0.66 mg/dL (ref 0.44–1.00)
Calcium: 8.8 mg/dL — ABNORMAL LOW (ref 8.9–10.3)
Glucose, Bld: 135 mg/dL — ABNORMAL HIGH (ref 65–99)
Potassium: 3.7 mmol/L (ref 3.5–5.1)
SODIUM: 139 mmol/L (ref 135–145)

## 2016-06-23 LAB — CBC
HCT: 30 % — ABNORMAL LOW (ref 36.0–46.0)
Hemoglobin: 9.5 g/dL — ABNORMAL LOW (ref 12.0–15.0)
MCH: 26.5 pg (ref 26.0–34.0)
MCHC: 31.7 g/dL (ref 30.0–36.0)
MCV: 83.6 fL (ref 78.0–100.0)
Platelets: 302 K/uL (ref 150–400)
RBC: 3.59 MIL/uL — ABNORMAL LOW (ref 3.87–5.11)
RDW: 18 % — ABNORMAL HIGH (ref 11.5–15.5)
WBC: 9 K/uL (ref 4.0–10.5)

## 2016-06-23 LAB — GLUCOSE, CAPILLARY
GLUCOSE-CAPILLARY: 117 mg/dL — AB (ref 65–99)
Glucose-Capillary: 149 mg/dL — ABNORMAL HIGH (ref 65–99)
Glucose-Capillary: 159 mg/dL — ABNORMAL HIGH (ref 65–99)

## 2016-06-23 LAB — MAGNESIUM: Magnesium: 1.5 mg/dL — ABNORMAL LOW (ref 1.7–2.4)

## 2016-06-23 LAB — HEPARIN LEVEL (UNFRACTIONATED): Heparin Unfractionated: 0.31 [IU]/mL (ref 0.30–0.70)

## 2016-06-23 MED ORDER — LACOSAMIDE 100 MG PO TABS: 100.0000 mg | ORAL_TABLET | Freq: Two times a day (BID) | ORAL | 1 refills | Status: DC

## 2016-06-23 MED ORDER — SORBITOL 70 % SOLN
960.0000 mL | TOPICAL_OIL | Freq: Once | ORAL | Status: AC
  Administered 2016-06-23: 960 mL via RECTAL
  Filled 2016-06-23: qty 240

## 2016-06-23 MED ORDER — ENOXAPARIN SODIUM 80 MG/0.8ML ~~LOC~~ SOLN: 80.0000 mg | Freq: Two times a day (BID) | SUBCUTANEOUS | 0 refills | Status: DC

## 2016-06-23 MED ORDER — POTASSIUM CHLORIDE CRYS ER 20 MEQ PO TBCR: 20.0000 meq | EXTENDED_RELEASE_TABLET | Freq: Every day | ORAL | 0 refills | Status: DC

## 2016-06-23 MED ORDER — MAGNESIUM OXIDE 400 (241.3 MG) MG PO TABS: 400.0000 mg | ORAL_TABLET | Freq: Two times a day (BID) | ORAL | 0 refills | Status: DC

## 2016-06-23 MED ORDER — LEVETIRACETAM 750 MG PO TABS: 1500.0000 mg | ORAL_TABLET | Freq: Two times a day (BID) | ORAL | 1 refills | Status: AC

## 2016-06-23 MED ORDER — LACOSAMIDE 100 MG PO TABS: 100.0000 mg | ORAL_TABLET | Freq: Two times a day (BID) | ORAL | 1 refills | Status: AC

## 2016-06-23 MED ORDER — LEVETIRACETAM 750 MG PO TABS: 1500.0000 mg | ORAL_TABLET | Freq: Two times a day (BID) | ORAL | 1 refills | Status: DC

## 2016-06-23 MED ORDER — MAGNESIUM OXIDE 400 (241.3 MG) MG PO TABS
400.0000 mg | ORAL_TABLET | Freq: Two times a day (BID) | ORAL | Status: DC
  Administered 2016-06-23: 400 mg via ORAL
  Filled 2016-06-23: qty 1

## 2016-06-23 NOTE — Discharge Summary (Signed)
Physician Discharge Summary  Sarah Mcguire [id number] DOB: 1940/09/29 DOA: 06/19/2016  PCP: Annye Asa, MD  Admit date: 06/19/2016 Discharge date: 06/23/2016  Admitted From: home Disposition:  home  Recommendations for Outpatient Follow-up:  1. Follow up with Dr. Ardis Hughs in 5 days for EUS / biopsy of pancreatic mass 2. Lovenox s.q. Until next Tuesday  Home Health: Home health physical therapy, home health aide Equipment/Devices: Wheelchair, walker  Discharge Condition: Stable CODE STATUS: Full code Diet recommendation: Regular  HPI: Per Dr. Lorin Mercy,  Sarah Mcguire is a 75 y.o. 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  The patient was completely out of it, wouldn't talk, rolling eyes to the back of her head, chewed on her tongue.  Husband called EMS.  This is the first time this has ever happened, to their knowledge.  Uncertain if seizure activity vs. Panic attack. Also, toes turning upward, causing a lot of pain and making it difficult for her to walk.  This has been over the last 2-3 days.  Scheduled to have EUS for diagnosis of pancreatitic cancer but she couldn't walk so they had to cancel. She was previously hospitalized from 10/7-11/17 for diverticulitis, at which time a CT showed an incidental mass at the pancreatic tail that appears to include possible metastatic disease.  She is still taking Cipro/Flagyl, has 3-4 days left.  No GI symptoms.  Has not been eating much.  Also had urinary retention during that hospitalization - a foley catheter was recommended but she declined.  She currently denies problems with retention. She was seen again in the ER on 10/13 for abdominal pain and urinary retention.  She had an I/O cath and was also diagnosed with anxiety at that time. She returned on 10/16 for panic attack, SOB, and foot pain.  She was placed in a CAM walker and  given potassium for hypokalemia.  She has a h/o DVT/PE as well as afib and takes Warfarin.  This had been held since her prior hospitalization in anticipation of upcoming ERCP but she took 10 mg tonight to restart it since she had to cancel the EUS.   Hospital Course: Discharge Diagnoses:  Principal Problem:   Altered mental status Active Problems:   Diabetes mellitus with neuropathy (HCC)   Anemia   Anxiety and depression   Essential hypertension   Long term current use of anticoagulant therapy   Foot pain   Atrial fibrillation (HCC)   Hypothyroidism   COPD (chronic obstructive pulmonary disease) (HCC)   Chronic systolic CHF (congestive heart failure) (HCC)   Pancreatic mass   Diverticulitis   Hyponatremia   Seizures (HCC)   Seizures - unclear etiology, would ideally like an MRI with contrast and LP however patient and her husband are refusing these tests. Neurology was consulted and have follow-up patient will hospitalized. She was started on 2 antiepileptics with Vimpat and Keppra with resolution of her seizures, she was switched from IV to by mouth and she has remained stable.  Anxiety - significant underlying anxiety patient taking Xanax 6 times daily, postentially playing a role in her seizures if she missed doses at home. Continue.  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 We'll need to resume Coumadin towards Chronic combined systolic and diastolic CHF - recent cath Sept 2017 with minor nonobstructive CAD,  euvolemic on discharge DM - continue SSI Recent  diverticulitis - patient is asymptomatic from GI standpoint without abdominal pain, nausea/vomiting, continue Ciprofloxacin and metronidazole, she has few days left, encouraged to complete antibiotics treatment as originally scheduled Fever - 10/8, ideally would have wanted an LP however patient and husband are refusing. No HA, no neck pain / photophobia / nausea so very low suspicion for meningitis. Her fever has resolved Constipation - patient with mild abdominal discomfort since no BMs for few days, resolved with enema.   Discharge Instructions     Medication List    STOP taking these medications   warfarin 5 MG tablet Commonly known as:  COUMADIN     TAKE these medications   acetaminophen 325 MG tablet Commonly known as:  TYLENOL Take 325-650 mg by mouth every 6 (six) hours as needed (for headache).   albuterol 108 (90 Base) MCG/ACT inhaler Commonly known as:  PROVENTIL HFA;VENTOLIN HFA Inhale 2 puffs into the lungs every 6 (six) hours as needed for wheezing or shortness of breath.   alprazolam 2 MG tablet Commonly known as:  XANAX Take 1 tablet (2 mg total) by mouth 3 (three) times daily. What changed:  how much to take  when to take this   BREO ELLIPTA IN Inhale 1 puff into the lungs daily.   ciprofloxacin 500 MG tablet Commonly known as:  CIPRO Take 1 tablet (500 mg total) by mouth 2 (two) times daily. What changed:  additional instructions   clobetasol cream 0.05 % Commonly known as:  TEMOVATE Apply 1 application topically 2 (two) times daily as needed (for yeast infection).   enoxaparin 80 MG/0.8ML injection Commonly known as:  LOVENOX Inject 0.8 mLs (80 mg total) into the skin every 12 (twelve) hours. Stop taking after the dose on October 24th, 2017 What changed:  additional instructions   esomeprazole 40 MG capsule Commonly known as:  NEXIUM Take 1 capsule (40 mg total) by mouth daily as needed. For acid reflux. What changed:  when to take  this  additional instructions   fenofibrate 160 MG tablet Take 1 tablet (160 mg total) by mouth daily.   folic acid 1 MG tablet Commonly known as:  FOLVITE Take 1 tablet (1 mg total) by mouth daily.   furosemide 40 MG tablet Commonly known as:  LASIX TAKE 1 TABLET BY MOUTH EVERY DAY What changed:  See the new instructions.   glucose blood test strip Commonly known as:  ONE TOUCH ULTRA TEST TEST TWICE A DAY AS DIRECTED   HYDROcodone-acetaminophen 5-325 MG tablet Commonly known as:  NORCO/VICODIN Take 1-2 tablets by mouth every 4 (four) hours as needed. What changed:  how much to take  reasons to take this   Lacosamide 100 MG Tabs Take 1 tablet (100 mg total) by mouth 2 (two) times daily.   levETIRAcetam 750 MG tablet Commonly known as:  KEPPRA Take 2 tablets (1,500 mg total) by mouth 2 (two) times daily.   levothyroxine 50 MCG tablet Commonly known as:  SYNTHROID, LEVOTHROID TAKE 1 TABLET (50 MCG TOTAL) BY MOUTH DAILY.   lisinopril 10 MG tablet Commonly known as:  PRINIVIL,ZESTRIL TAKE 1 TABLET BY MOUTH EVERY DAY What changed:  See the new instructions.   magnesium oxide 400 (241.3 Mg) MG tablet Commonly known as:  MAG-OX Take 1 tablet (400 mg total) by mouth 2 (two) times daily.   metFORMIN 500 MG 24 hr tablet Commonly known as:  GLUCOPHAGE-XR Take 500-1,000 mg by mouth See admin instructions. Pt takes 1000mg  in morning after breakfast, 500mg  in evening after dinner   metoprolol succinate 25 MG 24 hr tablet Commonly known as:  TOPROL XL Take 1 tablet (25 mg total) by mouth daily.   metroNIDAZOLE 500 MG tablet Commonly known as:  FLAGYL Take 1 tablet (500 mg total) by mouth every 8 (eight) hours. What changed:  additional instructions   multivitamin tablet Take 1 tablet by mouth daily.   OXYGEN Inhale 2 L into the lungs daily as needed (during activity and while sleeping).   potassium chloride SA 20 MEQ tablet Commonly known as:   K-DUR,KLOR-CON Take 1 tablet (20 mEq total) by mouth daily.   sertraline 25 MG tablet Commonly known as:  ZOLOFT Take 1 tablet (25 mg total) by mouth at bedtime.   simvastatin 20 MG tablet Commonly known as:  ZOCOR TAKE 1 TABLET BY MOUTH EVERY EVENING What changed:  See the new instructions.   terconazole 0.4 % vaginal cream Commonly known as:  TERAZOL 7 Place 1 applicator vaginally at bedtime as needed (itching).   Vitamin D 2000 units tablet Take 2,000 Units by mouth daily.      Follow-up Information    [facility] .   Why:  Home Health Physical Therapy and aide Contact information: [address] [place] [address] [phone]        [name], MD Follow up in 1 week(s).   Specialty:  Gastroenterology Why:  office will call on Monday Contact information: [address] [address] [phone]        [facility] .   Why:  wheelchair Contact information: [place] [address] [phone]          Allergies  Allergen Reactions  . Morphine And Related Other (See Comments)    Usually drops her blood pressure  . Cymbalta [Duloxetine Hcl] Other (See Comments)    Nausea, headache and diarrhea   . Amoxicillin Other (See Comments)    Unknown Has patient had a PCN reaction causing immediate rash, facial/tongue/throat swelling, SOB or lightheadedness with hypotension:unsure Has patient had a PCN reaction causing severe rash involving mucus membranes or skin necrosis:unsure Has patient had a PCN reaction that required hospitalization:unsure Has patient had a PCN reaction occurring within the last 10 years:unsure If all of the above answers are "NO", then may proceed with Cephalosporin use.     Marland Kitchen Amoxicillin-Pot Clavulanate Other (See Comments)    Has patient had a PCN reaction causing immediate rash, facial/tongue/throat swelling, SOB or lightheadedness with  hypotension:unsure Has patient had a PCN reaction causing severe rash involving mucus membranes or skin necrosis:unsure Has patient had a PCN reaction that required hospitalization:unsure Has patient had a PCN reaction occurring within the last 10 years:unsure If all of the above answers are "NO", then may proceed with Cephalosporin use.    . Citalopram Hydrobromide Itching       . Clonazepam Itching  . Macrobid [Nitrofurantoin Macrocrystal] Nausea And Vomiting  . Penicillins Other (See Comments)    Unknown Has patient had a PCN reaction  causing immediate rash, facial/tongue/throat swelling, SOB or lightheadedness with hypotension:unsure Has patient had a PCN reaction causing severe rash involving mucus membranes or skin necrosis:unsure Has patient had a PCN reaction that required hospitalization:unsure Has patient had a PCN reaction occurring within the last 10 years:unsure If all of the above answers are "NO", then may proceed with Cephalosporin use.      . Pyridium [Phenazopyridine Hcl] Other (See Comments)    headache  . Sertraline Hcl Anxiety  . Wellbutrin [Bupropion] Itching, Rash and Other (See Comments)    Itching, rash, hyper   . Zonegran Other (See Comments)    Unknown   . Zonisamide Other (See Comments)    Unknown     Consultations:  Neurology  Gastroenterology over the phone  Oncology  Procedures/Studies:  EEG  Ct Abdomen Pelvis Wo Contrast  Result Date: 06/09/2016 CLINICAL DATA:  Severe right-sided pain. EXAM: CT ABDOMEN AND PELVIS WITHOUT CONTRAST TECHNIQUE: Multidetector CT imaging of the abdomen and pelvis was performed following the standard protocol without IV contrast. COMPARISON:  None. 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 No other significant abnormalities in the lower chest. Hepatobiliary: Cholelithiasis is identified with mild distention of the gallbladder but no wall thickening or pericholecystic  fluid. The liver is unremarkable. Pancreas: The pancreatic head, neck, and body are normal. There is abnormal soft tissue attenuation in the region of the pancreatic tail which is new since July of 2012. This masslike finding is between the stomach and spleen measuring 5.2 x 3.6 cm on image 25 with mild adjacent fat stranding and an attenuation of 28 Hounsfield units. This finding is new since 2012. Spleen: Normal in size without focal abnormality. Adrenals/Urinary Tract: Nodularity in the left adrenal gland is stable. The right adrenal gland is normal. There is a stone in the lower left kidney which is stable. No hydronephrosis. No ureterectasis or ureteral stones. Stomach/Bowel: The bladder is normal in appearance. The small bowel is unremarkable. Colonic diverticuli are identified. There is significant stranding around the sigmoid colon with associated colonic wall thickening. These findings are consistent with diverticulitis. Evaluation for abscess is limited without contrast but none are seen. No extraluminal gas identified. The remainder of the colon is unremarkable. The visualized appendix is normal as well. Vascular/Lymphatic: Atherosclerosis is seen in the aorta as well as the iliac and femoral vessels. No aneurysm. No adenopathy. Reproductive: Uterus and bilateral adnexa are unremarkable. Other: No other abnormalities. Musculoskeletal: No acute bony abnormalities. Grade 1 anterolisthesis of L4 versus L5 is stable. IMPRESSION: 1. Sigmoid diverticulitis. 2. 5.2 x 3.6 cm masslike abnormality in the region of the pancreatic tail, between the spleen and the stomach. This is not well assessed without contrast. It is possible this could represent a complex fluid collection including a complex pseudocyst. However, a neoplasm is not excluded. Recommend clinical correlation. Recommend a CT scan with intravenous contrast for better assessment. 3. Cholelithiasis. 4. Atherosclerosis in the abdominal aorta.  Electronically Signed   By: Dorise Bullion III M.D   On: 06/09/2016 22:26   Ct Head Wo Contrast  Result Date: 06/19/2016 CLINICAL DATA:  75 year old female with high blood pressure and possible stroke. EXAM: CT HEAD WITHOUT CONTRAST TECHNIQUE: Contiguous axial images were obtained from the base of the skull through the vertex without intravenous contrast. COMPARISON:  None. FINDINGS: Brain: The ventricles and sulci are appropriate size for patient's age. Mild periventricular and deep white matter chronic microvascular ischemic changes noted. Areas of low  attenuation involving the periventricular white matter and adjacent to the basal ganglia in the frontal lobe bilaterally are likely chronic and related to microvascular ischemic changes. Acute ischemia is less likely. MRI may provide better evaluation if there is high clinical concern for acute infarct. There is no acute intracranial hemorrhage. No mass effect or midline shift noted. No extra-axial fluid collection. Vascular: No hyperdense vessel or unexpected calcification. Skull: Normal. Negative for fracture or focal lesion. Sinuses/Orbits: No acute finding. Other: None IMPRESSION: No acute intracranial hemorrhage. Mild age-related atrophy and chronic microvascular ischemic disease. If symptoms persist and there are no contraindications, MRI may provide better evaluation if clinically indicated Electronically Signed   By: Anner Crete M.D.   On: 06/19/2016 22:49   Mr Brain Wo Contrast  Result Date: 06/20/2016 CLINICAL DATA:  Initial evaluation for acute altered mental status. EXAM: MRI HEAD WITHOUT CONTRAST TECHNIQUE: Multiplanar, multiecho pulse sequences of the brain and surrounding structures were obtained without intravenous contrast. COMPARISON:  Prior CT from 06/19/2016. FINDINGS: Brain: Study mildly limited by motion artifact in the patient's inability to tolerate the full length of the exam. Axial T1 weighted sequence was not performed.  Diffuse prominence of the CSF containing spaces is compatible with generalized age-related cerebral atrophy. Patchy and confluent T2/FLAIR hyperintensity within the periventricular and deep white matter both cerebral hemispheres most likely related to chronic microvascular ischemic disease, mild in nature. Probable small remote lacunar infarcts noted within the bilateral basal ganglia. Small remote bilateral cerebellar infarcts noted as well. No abnormal foci of restricted diffusion to suggest acute or subacute ischemia. Gray-white matter differentiation maintained. No acute or chronic intracranial hemorrhage. No mass lesion, midline shift, or mass effect. No hydrocephalus. No extra-axial fluid collection. Major dural sinuses are grossly patent. Pituitary gland within normal limits. Vascular: Major intracranial vascular flow voids are preserved. Right vertebral artery hypoplastic. Skull and upper cervical spine: Craniocervical junction within normal limits. Visualized upper cervical spine unremarkable. Bone marrow signal intensity within normal limits. No scalp soft tissue abnormality. Sinuses/Orbits: Globes and orbital soft tissues grossly normal. Patient is status post lens extraction bilaterally. Mild scattered mucosal thickening within the ethmoidal air cells. Paranasal sinuses are otherwise clear. No mastoid effusion. Inner ear structures grossly normal. IMPRESSION: 1. No acute intracranial process identified. 2. 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 3. Generalized cerebral atrophy with mild chronic microvascular ischemic disease. Electronically Signed   By: Jeannine Boga M.D.   On: 06/20/2016 03:55   Ct Abdomen Pelvis W Contrast  Result Date: 06/11/2016 CLINICAL DATA:  Right lower quadrant abdominal pain. Pancreatic mass. EXAM: CT ABDOMEN AND PELVIS WITH CONTRAST TECHNIQUE: Multidetector CT imaging of the abdomen  and pelvis was performed using the standard protocol following bolus administration of intravenous contrast. CONTRAST:  100 cc Isovue 370 COMPARISON:  06/09/2016 CT scan FINDINGS: Lower chest: Trace bilateral pleural effusions with mild atelectasis in both lower lobes. Moderate cardiomegaly. Low-density blood pool. Hepatobiliary: Mildly distended gallbladder, 4.5 cm transverse diameter. Suspected small gallstones dependently in the gallbladder, images 29-32 series 7. No CBD dilatation. I do not see any metastatic lesions in the liver. Pancreas: There is an ill-defined 6.2 by 7.0 by 6.0 cm irregularly marginated hypoenhancing mass most closely associated with the tail the pancreas, invading the splenic hilum, surrounding the splenic artery, and abutting and conceivably invading the elder layers of the gastric wall, images 20 through 41 series 3. There is likely thrombosis of the main splenic  vein along the vicinity of the pancreatic mass, with numerous smaller venous varices along the gastric wall extending to the upper part of the splenic hilum. The mass extends to the margin of the left kidney and not image 78/12 but does not appear to invade into the renal parenchyma as best I can tell. Irregular extension of tumor along the margins of the mass. The rest of the pancreas is atrophic. Spleen: The lower spleen is invaded by the pancreatic mass. Adrenals/Urinary Tract: The left adrenal gland appears involved with the pancreatic mass, thickened and directly tangential to the mass. Stomach/Bowel: The mass abuts and may be invading the gastric fundus. There is considerable active sigmoid diverticulitis with extensive local inflammation, stranding in adipose tissues, secondary inflammation of adjacent loops of bowel, and potentially a small amount of extraluminal gas posterior to the sigmoid colon which appears contained. Vascular/Lymphatic: No mass along the celiac trunk or SMA. No thrombosis of the portal vein or  superior mesenteric vein. Conventional hepatic arterial vascular pattern with the right and left hepatic arteries arising from the proper hepatic artery which has a celiac origin. Aortoiliac atherosclerotic vascular disease. Left periaortic lymph node 0.7 cm in short axis on image 36/7, within normal limits. Mild focal ectasia of the distal most abdominal aorta. Reproductive: Potential secondary inflammation of pelvic structures due to the sigmoid diverticulitis. Other: There is some subtle areas of nodularity along the left paracolic gutter which are very faint but merit surveillance. 2.2 by 1.2 cm nodular focus along the anterior peritoneal margins/ linea alba on image 28/13 could be postoperative scarring or tumor. There is also some nodularity along the umbilicus. Musculoskeletal: 6 mm anterolisthesis at L4-5 due to degenerative facet arthropathy. IMPRESSION: 1. 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 Invasion of the stomach fundus not excluded, renal invasion doubtful. Pancreatic adenocarcinoma is the most likely possibility, lymphoma possible but less likely, GI stromal tumor less likely. 2. There are several nodular enhancing lesions along the linea alba/umbilical region along with faint nodularity along the left paracolic gutter, cannot exclude metastatic lesions. I do not see any metastatic lesions to the liver. 3. 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 There is also fairly prominent sigmoid colon diverticulitis with a small amount of posterior extraluminal contained gas, but no free-flowing intraperitoneal gas. Extensive inflammatory findings surround the involved portion of the sigmoid colon with secondary inflammation of surrounding structures. 4. Trace bilateral  pleural effusions.  Moderate cardiomegaly. 5. Low-density blood pool suggests anemia. 6. Mildly distended gallbladder. Small gallstones, as shown on recent ultrasound. Electronically Signed   By: Van Clines M.D.   On: 06/11/2016 11:20   US Abdomen Limited  Result Date: 06/10/2016 CLINICAL DATA:  Right-sided abdominal pain. EXAM: US ABDOMEN LIMITED - RIGHT UPPER QUADRANT COMPARISON:  CT scan from earlier today FINDINGS: Gallbladder: Shadowing stones are seen in the gallbladder with no Murphy's sign, wall thickening, or pericholecystic fluid. Common bile duct: Diameter: 6.8 mm Liver: No focal lesion identified. Within normal limits in parenchymal echogenicity. IMPRESSION: 1. Cholelithiasis with no wall thickening, pericholecystic fluid, or Murphy's sign. 2. The common bile duct measures 6.8 mm which is prominent but within normal limits for a patient of this age. Recommend correlation with labs. Electronically Signed   By: Dorise Bullion III M.D  On: 06/10/2016 00:22   Dg Chest Port 1 View  Result Date: 06/09/2016 CLINICAL DATA:  Acute onset of generalized chest pain and right lower quadrant abdominal pain. Initial encounter. EXAM: PORTABLE CHEST 1 VIEW COMPARISON:  Chest radiograph performed 05/07/2016, and CTA of the chest performed 03/19/2016 FINDINGS: The lungs are well-aerated. Minimal bibasilar atelectasis is noted. There is no evidence of pleural effusion or pneumothorax. The cardiomediastinal silhouette is mildly enlarged. No acute osseous abnormalities are seen. IMPRESSION: Minimal bibasilar atelectasis noted. Lungs otherwise clear. Mild cardiomegaly. Electronically Signed   By: Garald Balding M.D.   On: 06/09/2016 21:18   Dg Abd Acute W/chest  Result Date: 06/18/2016 CLINICAL DATA:  75 year old female with confusion, generalized abdominal pain. Diarrhea. Sigmoid diverticulitis and pancreatic mass on recent CT. Initial encounter. EXAM: DG ABDOMEN ACUTE W/ 1V CHEST COMPARISON:  CT  Abdomen and Pelvis 06/11/2016 and earlier. FINDINGS: Semi upright AP view of the chest. Stable lung volumes. Stable cardiac size and mediastinal contours. Calcified aortic atherosclerosis. Visualized tracheal air column is within normal limits. No pneumothorax, pulmonary edema or confluent pulmonary opacity. Left-side-down lateral decubitus view of the abdomen. No pneumoperitoneum. Supine views of the abdomen demonstrate a nonobstructed bowel-gas pattern similar to that on the prior CT Abdomen and Pelvis. The urinary bladder appears distended as before. Aortoiliac calcified atherosclerosis noted. No acute osseous abnormality identified. IMPRESSION: 1.  Normal bowel gas pattern, no free air. 2.  No acute cardiopulmonary abnormality. 3.  Calcified aortic atherosclerosis. Electronically Signed   By: Genevie Ann M.D.   On: 06/18/2016 08:49   Dg Foot Complete Right  Result Date: 06/18/2016 CLINICAL DATA:  Right foot pain, no known trauma EXAM: RIGHT FOOT COMPLETE - 3+ VIEW COMPARISON:  None. FINDINGS: Three views of the right foot submitted. There is diffuse osteopenia. Small plantar spur of calcaneus. No acute fracture or subluxation. IMPRESSION: No acute fracture or subluxation. Diffuse osteopenia. Small plantar spur of calcaneus. Electronically Signed   By: Lahoma Crocker M.D.   On: 06/18/2016 08:40      Subjective: - no chest pain, shortness of breath, no nausea or vomiting.   Discharge Exam: Vitals:   06/23/16 1100 06/23/16 1629  BP:  (!) 121/106  Pulse:  91  Resp:  17  Temp: 97.6 F (36.4 C) 97.7 F (36.5 C)   Vitals:   06/23/16 0913 06/23/16 1057 06/23/16 1100 06/23/16 1629  BP:  109/67  (!) 121/106  Pulse:  90  91  Resp:    17  Temp:   97.6 F (36.4 C) 97.7 F (36.5 C)  TempSrc:   Axillary Oral  SpO2: 97%   91%    General: Pt is alert, awake, not in acute distress Cardiovascular: RRR, S1/S2 +, no rubs, no gallops Respiratory: CTA bilaterally, no wheezing, no rhonchi Abdominal: Soft,  bowel sounds + Extremities: no edema, no cyanosis    The results of significant diagnostics from this hospitalization (including imaging, microbiology, ancillary and laboratory) are listed below for reference.     Microbiology: Recent Results (from the past 240 hour(s))  MRSA PCR Screening     Status: None   Collection Time: 06/21/16  4:27 AM  Result Value Ref Range Status   MRSA by PCR NEGATIVE NEGATIVE Final    Comment:        The GeneXpert MRSA Assay (FDA approved for NASAL specimens only), is one component of a comprehensive MRSA colonization surveillance program. It is not intended to diagnose MRSA infection nor to guide or  monitor treatment for MRSA infections.      Labs: BNP (last 3 results)  Recent Labs  03/19/16 0017 05/07/16 1447  BNP 184.9* 123XX123*   Basic Metabolic Panel:  Recent Labs Lab 06/19/16 2059 06/19/16 2115 06/20/16 [id number] 06/20/16 1505 06/21/16 0427 06/22/16 0437 06/22/16 0917 06/23/16 0350  NA 129* 128* 134*  --  137 139  --  139  K 3.0* 3.0* 3.4*  --  3.1* 3.4*  --  3.7  CL 88* 88* 99*  --  107 110  --  109  CO2 22  --  24  --  22 25  --  25  GLUCOSE 206* 186* 140*  --  125* 132*  --  135*  BUN 6 6 <5*  --  <5* 5*  --  7  CREATININE 0.93 0.70 0.66  --  0.67 0.64  --  0.66  CALCIUM 9.9  --  8.9  --  7.7* 8.0*  --  8.8*  MG  --   --   --  1.1*  --   --  1.4* 1.5*   Liver Function Tests:  Recent Labs Lab 06/19/16 2059  AST 56*  ALT 31  ALKPHOS 46  BILITOT 0.6  PROT 6.7  ALBUMIN 3.9   No results for input(s): LIPASE, AMYLASE in the last 168 hours. No results for input(s): AMMONIA in the last 168 hours. CBC:  Recent Labs Lab 06/19/16 2059 06/19/16 2115 06/21/16 0427 06/22/16 0437 06/23/16 0940  WBC 10.3  --  8.5 7.4 9.0  NEUTROABS 7.7  --   --   --   --   HGB 10.9* 13.3 8.4* 8.4* 9.5*  HCT 33.9* 39.0 25.8* 26.1* 30.0*  MCV 84.3  --  82.4 83.4 83.6  PLT 483*  --  342 294 302   Cardiac Enzymes: No results for  input(s): CKTOTAL, CKMB, CKMBINDEX, TROPONINI in the last 168 hours. BNP: Invalid input(s): POCBNP CBG:  Recent Labs Lab 06/22/16 1128 06/22/16 1742 06/22/16 2118 06/23/16 0807 06/23/16 1140  GLUCAP 149* 144* 150* 149* 159*   D-Dimer No results for input(s): DDIMER in the last 72 hours. Hgb A1c No results for input(s): HGBA1C in the last 72 hours. Lipid Profile No results for input(s): CHOL, HDL, LDLCALC, TRIG, CHOLHDL, LDLDIRECT in the last 72 hours. Thyroid function studies No results for input(s): TSH, T4TOTAL, T3FREE, THYROIDAB in the last 72 hours.  Invalid input(s): FREET3 Anemia work up No results for input(s): VITAMINB12, FOLATE, FERRITIN, TIBC, IRON, RETICCTPCT in the last 72 hours. Urinalysis    Component Value Date/Time   COLORURINE YELLOW 06/19/2016 2307   APPEARANCEUR CLEAR 06/19/2016 2307   LABSPEC 1.007 06/19/2016 2307   PHURINE 7.0 06/19/2016 2307   GLUCOSEU NEGATIVE 06/19/2016 2307   HGBUR NEGATIVE 06/19/2016 2307   HGBUR negative 10/24/2010 1140   BILIRUBINUR NEGATIVE 06/19/2016 2307   BILIRUBINUR negative 06/04/2016 1108   KETONESUR NEGATIVE 06/19/2016 2307   PROTEINUR NEGATIVE 06/19/2016 2307   UROBILINOGEN 0.2 06/04/2016 1108   UROBILINOGEN 0.2 04/04/2015 1328   NITRITE NEGATIVE 06/19/2016 2307   LEUKOCYTESUR NEGATIVE 06/19/2016 2307   Sepsis Labs Invalid input(s): PROCALCITONIN,  WBC,  LACTICIDVEN Microbiology Recent Results (from the past 240 hour(s))  MRSA PCR Screening     Status: None   Collection Time: 06/21/16  4:27 AM  Result Value Ref Range Status   MRSA by PCR NEGATIVE NEGATIVE Final    Comment:        The GeneXpert MRSA Assay (FDA approved  for NASAL specimens only), is one component of a comprehensive MRSA colonization surveillance program. It is not intended to diagnose MRSA infection nor to guide or monitor treatment for MRSA infections.      Time coordinating discharge: Over 30 minutes  SIGNED:  Marzetta Board,  MD  [facility] 06/23/2016, 4:47 PM Pager [phone]  If 7PM-7AM, please contact night-coverage [email or web address] Password [email or web address]

## 2016-06-23 NOTE — Progress Notes (Addendum)
Pt's spouse did NOT bring an O2 tank for transport home and this CM has arranged for [facility] DME rep, Reggie to please deliver a loaner tank with wheelchair so pt can get home. RN aware of loaner tank being delivered to room for transport home.  No other CM needs were communicated.   CM received call for wheelchair; CM notified [place] DME rep, Reggie to please deliver the wheelchair to room so pt can discharge.  CM notified [facility] rep, Jermaine of discharge for [facility] for HHPT/Aide.  No other CM needs were communicated.

## 2016-06-23 NOTE — Progress Notes (Signed)
Physical Therapy Treatment Patient Details Name: Sarah Mcguire MRN: [id number] DOB: September 28, 1940 Today's Date: 06/23/2016    History of Present Illness 75 y.o. female admitted to [facility] on 06/19/16 for seizure activity.  Pt with significant PMHx of DM, PE, on home O2 at baseline, neuropathy, HTN, DVT, diverticulitis, CAD, chronic low back pain, A-fib with RVR, anxiety, anemai, and L TKA.    PT Comments    Pt admitted with above diagnosis. Pt currently with functional limitations due to poor balance and poor endurance with pt needing max encouragement to participate.  Husband pushing PT to walk pt and pushing for pt to walk so that they can go home today.  Pt finally agreeable with max encouragement.  Placed Depends on pt as she is incontinent.  Had husband demonstrate he could manage pt with walking and he walked right behind her and he did demonstrate he can assist with ambulation.  Recommended wheelchair for pt as she is very unsteady with walking and felt that wheelchair would help decr burden of care for husband by decreasing falls and assist him with community mobility as well.  Husband accepting of [facility] recommendations but did not want to get gait belt.  Husband states that he will see what the cost for the wheelchair is and then decide if they want it.  Husband does not seem to have awareness of pts deficits but does tend to pt.  Encouraged he use wheelchair initially at home.  Pt will benefit from skilled PT to increase their independence and safety with mobility to allow discharge to the venue listed below.    Follow Up Recommendations  Home health PT;Supervision/Assistance - 24 hour;Other (comment) ([place] aide, [place], [place], [place])     Equipment Recommendations  Wheelchair (measurements PT);Wheelchair cushion (measurements PT) (18x16 wheelchair, footrests, anti tippers, desk arms) Basic wheelchair cushion; gait belt, home O2   Recommendations for Other Services       Precautions /  Restrictions Precautions Precautions: Fall Precaution Comments: pt is very unsteady on her feet and tends to lean posteriorly.  Restrictions Weight Bearing Restrictions: No    Mobility  Bed Mobility Overal bed mobility: Needs Assistance Bed Mobility: Supine to Sit     Supine to sit: Mod assist;+2 for physical assistance     General bed mobility comments: Pt did not initiate trying to pull up to sitting EOB with PT and husband moving her LEs and  lifting  her trunk up dependently.   Transfers Overall transfer level: Needs assistance Equipment used: Rolling walker (2 wheeled) Transfers: Sit to/from Stand Sit to Stand: Mod assist;Max assist;+2 physical assistance         General transfer comment: Mod/max assist to support trunk to get to standing with verbal cues for safe hand placement.  Pt with posterior preference throughout transitions. Pt also buckled a few times needing corrective assist.    Ambulation/Gait Ambulation/Gait assistance: Mod assist;Max assist;+2 physical assistance Ambulation Distance (Feet): 110 Feet Assistive device: Rolling walker (2 wheeled) Gait Pattern/deviations: Step-through pattern;Decreased stride length;Shuffle;Leaning posteriorly;Drifts right/left;Trunk flexed;Decreased step length - left Gait velocity: decreased Gait velocity interpretation: Below normal speed for age/gender General Gait Details: pt with slow, shuffling gait speed.  Pt with posterior lean and LOB during gait. Verbal cues for safe RW use and closer proximity to RW.  Increased assistance as pt fatigued. Husband walked behind pt and did demonstrate he can assist with walking with RW appropriately.  Recommended gait belt and husband disagrees that he doesn't need it.  Pt took standing rest breaks as well.    Stairs            Wheelchair Mobility    Modified Rankin (Stroke Patients Only)       Balance Overall balance assessment: Needs assistance;History of  Falls Sitting-balance support: Bilateral upper extremity supported;Feet supported Sitting balance-Leahy Scale: Poor Sitting balance - Comments: Min assist EOB to prevent posterior LOB.  Postural control: Posterior lean Standing balance support: Bilateral upper extremity supported;During functional activity Standing balance-Leahy Scale: Poor Standing balance comment: Relies heavily on UE support. Heavy posterior lean as well.                      Cognition Arousal/Alertness: Awake/alert Behavior During Therapy: Flat affect Overall Cognitive Status: Impaired/Different from baseline Area of Impairment: Problem solving             Problem Solving: Slow processing;Decreased initiation;Difficulty sequencing;Requires verbal cues;Requires tactile cues General Comments: Pt is slow to process (vs HOH), had difficulty intiating movement or sequencing even getting to EOB.     Exercises      General Comments        Pertinent Vitals/Pain Pain Assessment: Faces Faces Pain Scale: Hurts little more Pain Location: stomach Pain Descriptors / Indicators: Aching Pain Intervention(s): Monitored during session;Limited activity within patient's tolerance;Repositioned  HR 94-151 (HR to 140 and 151 bpm fluctuating higher and pt would stand and rest and then continue with HR 128-135 bpm)- unsure if artifact when incr HR.  98% on 2LO2 with ambulation.    Home Living                      Prior Function            PT Goals (current goals can now be found in the care plan section) Acute Rehab PT Goals Patient Stated Goal: husband wants her to walk enough to go home Progress towards PT goals: Progressing toward goals    Frequency    Min 3X/week      PT Plan Current plan remains appropriate    Co-evaluation             End of Session Equipment Utilized During Treatment: Gait belt;Oxygen Activity Tolerance: Patient limited by fatigue Patient left: in chair;with call  bell/phone within reach;with family/visitor present     Time: 0915-1005 PT Time Calculation (min) (ACUTE ONLY): 50 min  Charges:  $Gait Training: 23-37 mins $Self Care/Home Management: 8-22                    G CodesDenice Paradise 18-Jul-2016, 11:18 AM Thelda Gagan,PT Acute Rehabilitation [phone] (pager)

## 2016-06-23 NOTE — Progress Notes (Signed)
ANTICOAGULATION CONSULT NOTE - Follow Up Consult  Pharmacy Consult for Heparin Indication: h/o PE/DVT and Afib  Assessment: 75 y.o. female admitted with AMS, h/o PE/DVT and Afib, on chronic coumadin which has been on hold since last week in anticipation of EUS and biopsy. Heparin level is at low end of therapeutic level at 0.31 units/mL on gtt at 1000 units/hr. CBC and renal function stable. No bleeding noted.    Goal of Therapy:  Heparin level 0.3-0.7 units/ml Monitor platelets by anticoagulation protocol: Yes   Plan:  Contiue heparin infusion 1000 units/hr F/u daily heparin level and CBC Monitor for signs/symptoms of bleeding    Allergies  Allergen Reactions  . Morphine And Related Other (See Comments)    Usually drops her blood pressure  . Cymbalta [Duloxetine Hcl] Other (See Comments)    Nausea, headache and diarrhea   . Amoxicillin Other (See Comments)    Unknown Has patient had a PCN reaction causing immediate rash, facial/tongue/throat swelling, SOB or lightheadedness with hypotension:unsure Has patient had a PCN reaction causing severe rash involving mucus membranes or skin necrosis:unsure Has patient had a PCN reaction that required hospitalization:unsure Has patient had a PCN reaction occurring within the last 10 years:unsure If all of the above answers are "NO", then may proceed with Cephalosporin use.     Marland Kitchen Amoxicillin-Pot Clavulanate Other (See Comments)    Has patient had a PCN reaction causing immediate rash, facial/tongue/throat swelling, SOB or lightheadedness with hypotension:unsure Has patient had a PCN reaction causing severe rash involving mucus membranes or skin necrosis:unsure Has patient had a PCN reaction that required hospitalization:unsure Has patient had a PCN reaction occurring within the last 10 years:unsure If all of the above answers are "NO", then may proceed with Cephalosporin use.    . Citalopram Hydrobromide Itching       . Clonazepam  Itching  . Macrobid [Nitrofurantoin Macrocrystal] Nausea And Vomiting  . Penicillins Other (See Comments)    Unknown Has patient had a PCN reaction causing immediate rash, facial/tongue/throat swelling, SOB or lightheadedness with hypotension:unsure Has patient had a PCN reaction causing severe rash involving mucus membranes or skin necrosis:unsure Has patient had a PCN reaction that required hospitalization:unsure Has patient had a PCN reaction occurring within the last 10 years:unsure If all of the above answers are "NO", then may proceed with Cephalosporin use.      . Pyridium [Phenazopyridine Hcl] Other (See Comments)    headache  . Sertraline Hcl Anxiety  . Wellbutrin [Bupropion] Itching, Rash and Other (See Comments)    Itching, rash, hyper   . Zonegran Other (See Comments)    Unknown   . Zonisamide Other (See Comments)    Unknown     Patient Measurements:   Heparin Dosing Weight: 65 kg  Vital Signs: Temp: 98.7 F (37.1 C) (10/21 0711) Temp Source: Oral (10/21 0711) BP: 109/67 (10/21 1057) Pulse Rate: 90 (10/21 1057)  Labs:  Recent Labs  06/21/16 0427 06/21/16 0540 06/22/16 0437 06/22/16 0447 06/23/16 0350 06/23/16 0940  HGB 8.4*  --  8.4*  --   --  9.5*  HCT 25.8*  --  26.1*  --   --  30.0*  PLT 342  --  294  --   --  302  HEPARINUNFRC 1.04* 0.38  --  0.49 0.31  --   CREATININE 0.67  --  0.64  --  0.66  --     Estimated Creatinine Clearance: 58 mL/min (by C-G formula based on SCr of  0.66 mg/dL).  Demetrius Charity, PharmD Acute Care Pharmacy Resident  Pager: [phone] 06/23/2016

## 2016-06-23 NOTE — Progress Notes (Signed)
PT Note Treatment complete.  Full note to follow.  Husband wants to take pt home. Willneed HHPT,HHOT,HHaide, HHSW, HHRN.  Also will need 18x16 wheelchair with foot rests, anti tippers, and desk arms.  Basic wheelchair cushion as well.  Thanks. [place] [phone] (pager)

## 2016-06-23 NOTE — Progress Notes (Signed)
Discharge note. Patient and husband at bedside for discharge teaching. Reviewed all medications, follow up appointments, when to call physician, and answered all questions. [facility] had already brought portable oxygen and wheelchair. Patient reluctant to leave, diverted opinion to husband and husband adamant to go home. All belongings collected and sent home with patient and husband. NT transported patient to car via wheelchair.  Basil Dess, RN

## 2016-06-23 NOTE — Discharge Instructions (Signed)
Follow with Annye Asa, MD in 1-2 weeks  Please get a complete blood count and chemistry panel checked by your Primary MD at your next visit, and again as instructed by your Primary MD. Please get your medications reviewed and adjusted by your Primary MD.  Please request your Primary MD to go over all Hospital Tests and Procedure/Radiological results at the follow up, please get all Hospital records sent to your Prim MD by signing hospital release before you go home.  If you had Pneumonia of Lung problems at the Hospital: Please get a 2 view Chest X ray done in 6-8 weeks after hospital discharge or sooner if instructed by your Primary MD.  If you have Congestive Heart Failure: Please call your Cardiologist or Primary MD anytime you have any of the following symptoms:  1) 3 pound weight gain in 24 hours or 5 pounds in 1 week  2) shortness of breath, with or without a dry hacking cough  3) swelling in the hands, feet or stomach  4) if you have to sleep on extra pillows at night in order to breathe  Follow cardiac low salt diet and 1.5 lit/day fluid restriction.  If you have diabetes Accuchecks 4 times/day, Once in AM empty stomach and then before each meal. Log in all results and show them to your primary doctor at your next visit. If any glucose reading is under 80 or above 300 call your primary MD immediately.  If you have Seizure/Convulsions/Epilepsy: Please do not drive, operate heavy machinery, participate in activities at heights or participate in high speed sports until you have seen by Primary MD or a Neurologist and advised to do so again.  If you had Gastrointestinal Bleeding: Please ask your Primary MD to check a complete blood count within one week of discharge or at your next visit. Your endoscopic/colonoscopic biopsies that are pending at the time of discharge, will also need to followed by your Primary MD.  Get Medicines reviewed and adjusted. Please take all your  medications with you for your next visit with your Primary MD  Please request your Primary MD to go over all hospital tests and procedure/radiological results at the follow up, please ask your Primary MD to get all Hospital records sent to his/her office.  If you experience worsening of your admission symptoms, develop shortness of breath, life threatening emergency, suicidal or homicidal thoughts you must seek medical attention immediately by calling 911 or calling your MD immediately  if symptoms less severe.  You must read complete instructions/literature along with all the possible adverse reactions/side effects for all the Medicines you take and that have been prescribed to you. Take any new Medicines after you have completely understood and accpet all the possible adverse reactions/side effects.   Do not drive or operate heavy machinery when taking Pain medications.   Do not take more than prescribed Pain, Sleep and Anxiety Medications  Special Instructions: If you have smoked or chewed Tobacco  in the last 2 yrs please stop smoking, stop any regular Alcohol  and or any Recreational drug use.  Wear Seat belts while driving.  Please note You were cared for by a hospitalist during your hospital stay. If you have any questions about your discharge medications or the care you received while you were in the hospital after you are discharged, you can call the unit and asked to speak with the hospitalist on call if the hospitalist that took care of you is not available. Once  you are discharged, your primary care physician will handle any further medical issues. Please note that NO REFILLS for any discharge medications will be authorized once you are discharged, as it is imperative that you return to your primary care physician (or establish a relationship with a primary care physician if you do not have one) for your aftercare needs so that they can reassess your need for medications and monitor your  lab values.  You can reach the hospitalist office at phone [phone] or fax [phone]   If you do not have a primary care physician, you can call [phone] for a physician referral.  Activity: As tolerated with Full fall precautions use walker/cane & assistance as needed  Diet: regular  Disposition Home

## 2016-06-25 ENCOUNTER — Other Ambulatory Visit: Payer: Self-pay | Admitting: *Deleted

## 2016-06-25 ENCOUNTER — Telehealth: Payer: Self-pay | Admitting: Family Medicine

## 2016-06-25 ENCOUNTER — Encounter: Payer: Self-pay | Admitting: General Practice

## 2016-06-25 NOTE — Telephone Encounter (Signed)
[facility] was ordered per D/C summary.  Between [facility] and [facility], they will get the services they need, BUT they need to be receptive of attempts to contact them and visit the home

## 2016-06-25 NOTE — Telephone Encounter (Signed)
Noted  

## 2016-06-25 NOTE — Patient Outreach (Addendum)
[facility]) Care Management  06/25/2016  Sarah Mcguire 01-28-1941 [id number]   RN contacted pt's primary care provided Dr. Birdie Riddle and spoke with Janett Billow. RN provided the information discussed with the pt and her spouse today concerning pt's pain and cramps. Reported pt unable ambulate and having severe pain "allover". Based upon this information and pt unable to seek medical attention from a providers office pt recommended to go back to the hospital. RN inquired if Dr. Birdie Riddle would like for this RN to notify the caregiver and pt of this information or was Dr. Birdie Riddle going to notify this pt.   RN contacted the pt back and explained the information provided to the primary care doctor and was informed that she should return to the hospital. Also informed the pt that pt should inquire on the needed biopsy that is planned on this Thursday however the pt states she would not be able to make this appointment. Pt initially indicated she was not going to the hospital but initially indicated should be go to [facility]. RN notified the [facility] hospital liaison Marthenia Rolling) with her update on the pt's needed. Based upon the pt returning to the hospital will not schedule a home visit on this Wednesday with this pt as initially planned. Will follow up accordingly upon pt's discharge or revisit to the hospital concerning her current status.  Sarah Mina, RN Care Management Coordinator [place] Office [phone]

## 2016-06-25 NOTE — Telephone Encounter (Signed)
Husband states that pt was in the hosp last Tues and asking what is the next step for pt. Pt was walking in hosp and now she is unable to. Husband states that home health is needed.

## 2016-06-25 NOTE — Telephone Encounter (Signed)
Pt informed on mychart.

## 2016-06-25 NOTE — Patient Outreach (Signed)
[facility]) Care Management  06/25/2016  JAYLINN CICCARELLI 1941-05-11 [id number]  RN spoke with pt who provided consent to speak with her spouse Shanon Brow). RN introduced [facility] services and available programs. Spouse explains pt have severe pain since arriving home and feels she needs further assistance. States pt can not walk but was able to walk prior to her discharging the hospitalization. States he is aware that pt will have [facility] for PT but no nursing. States pt had a chemical imbalance with her sodium, magnesium and other supplements while at the hospital and daily labs were obtained to correct.  Spouse feels pt should have the same labs drawn in the home to check any ongoing imbalance. RN inquired on contact to the provider's office as spouse indicates he has called but no call back. RN gather information concerning pt's pain and specific information on the duration (ongoing), intensity of the pain (10) and any medication administered to assist pt with her pain (prescribed pain medication has been taken). Spouse states pt continues to use her home O2 with no distressful breathing on 2 liters. Pt states she still "hurts all over". RN gathered additional information and inform pt if her pain was that severe and she is unable to ambulate to seek medical attention she needs to call 911. Pt and caregiver hesitant and indicated they are attempting to reach Dr. Birdie Riddle office (RN offered to call). If pt remains available RN also offered a home visit to further assess pt's needs. Initially pt declined indicating she can take her own vitals but eventually willing to accept an appointment that will be scheduled on Wednesday afternoon (spouse request) however pending if pt remains in the home if not this appointment will not be scheduled. Will contact Dr. Birdie Riddle and inquire further on any interventions.  Patient was recently discharged from hospital and all medications have been reviewed.  Raina Mina, RN Care Management Coordinator [place] Office [phone]

## 2016-06-25 NOTE — Telephone Encounter (Signed)
Pt husband calling to let KT know that they will not be going to the hosp today for pt pain due to the weather, that he will take her tomorrow. Husband states that Keane Scrape never called him back from first message that he left this morning. I made him aware that Jess did a message through [place]. Husband states that he does not have time to look at that acct and to please call him from now on.

## 2016-06-26 ENCOUNTER — Emergency Department (HOSPITAL_COMMUNITY): Payer: Medicare Other

## 2016-06-26 ENCOUNTER — Telehealth: Payer: Self-pay | Admitting: Family Medicine

## 2016-06-26 ENCOUNTER — Emergency Department (HOSPITAL_COMMUNITY)
Admission: EM | Admit: 2016-06-26 | Discharge: 2016-06-26 | Disposition: A | Discharge status: Home / Self Care | Payer: Medicare Other | Attending: Emergency Medicine | Admitting: Emergency Medicine

## 2016-06-26 ENCOUNTER — Other Ambulatory Visit: Payer: Self-pay

## 2016-06-26 ENCOUNTER — Encounter (HOSPITAL_COMMUNITY): Payer: Self-pay

## 2016-06-26 DIAGNOSIS — R1011 Right upper quadrant pain: Secondary | ICD-10-CM | POA: Diagnosis not present

## 2016-06-26 DIAGNOSIS — Z96652 Presence of left artificial knee joint: Secondary | ICD-10-CM | POA: Insufficient documentation

## 2016-06-26 DIAGNOSIS — E039 Hypothyroidism, unspecified: Secondary | ICD-10-CM | POA: Insufficient documentation

## 2016-06-26 DIAGNOSIS — Z7901 Long term (current) use of anticoagulants: Secondary | ICD-10-CM | POA: Insufficient documentation

## 2016-06-26 DIAGNOSIS — Z87891 Personal history of nicotine dependence: Secondary | ICD-10-CM | POA: Insufficient documentation

## 2016-06-26 DIAGNOSIS — J449 Chronic obstructive pulmonary disease, unspecified: Secondary | ICD-10-CM | POA: Insufficient documentation

## 2016-06-26 DIAGNOSIS — Z7984 Long term (current) use of oral hypoglycemic drugs: Secondary | ICD-10-CM | POA: Insufficient documentation

## 2016-06-26 DIAGNOSIS — R109 Unspecified abdominal pain: Secondary | ICD-10-CM | POA: Insufficient documentation

## 2016-06-26 DIAGNOSIS — E1140 Type 2 diabetes mellitus with diabetic neuropathy, unspecified: Secondary | ICD-10-CM | POA: Diagnosis not present

## 2016-06-26 DIAGNOSIS — I2510 Atherosclerotic heart disease of native coronary artery without angina pectoris: Secondary | ICD-10-CM | POA: Insufficient documentation

## 2016-06-26 DIAGNOSIS — M79671 Pain in right foot: Secondary | ICD-10-CM | POA: Insufficient documentation

## 2016-06-26 DIAGNOSIS — Z79899 Other long term (current) drug therapy: Secondary | ICD-10-CM | POA: Diagnosis not present

## 2016-06-26 DIAGNOSIS — I5022 Chronic systolic (congestive) heart failure: Secondary | ICD-10-CM | POA: Insufficient documentation

## 2016-06-26 DIAGNOSIS — I110 Hypertensive heart disease with heart failure: Secondary | ICD-10-CM | POA: Diagnosis not present

## 2016-06-26 LAB — CBC WITH DIFFERENTIAL/PLATELET
BASOS PCT: 0 %
Basophils Absolute: 0 10*3/uL (ref 0.0–0.1)
EOS ABS: 0.1 10*3/uL (ref 0.0–0.7)
Eosinophils Relative: 2 %
HEMATOCRIT: 33.1 % — AB (ref 36.0–46.0)
HEMOGLOBIN: 10.4 g/dL — AB (ref 12.0–15.0)
LYMPHS ABS: 1.9 10*3/uL (ref 0.7–4.0)
Lymphocytes Relative: 28 %
MCH: 26.9 pg (ref 26.0–34.0)
MCHC: 31.4 g/dL (ref 30.0–36.0)
MCV: 85.5 fL (ref 78.0–100.0)
MONO ABS: 0.6 10*3/uL (ref 0.1–1.0)
MONOS PCT: 10 %
NEUTROS PCT: 60 %
Neutro Abs: 4 10*3/uL (ref 1.7–7.7)
Platelets: 357 10*3/uL (ref 150–400)
RBC: 3.87 MIL/uL (ref 3.87–5.11)
RDW: 18.2 % — AB (ref 11.5–15.5)
WBC: 6.7 10*3/uL (ref 4.0–10.5)

## 2016-06-26 LAB — CK: CK TOTAL: 13 U/L — AB (ref 38–234)

## 2016-06-26 LAB — COMPREHENSIVE METABOLIC PANEL
ALK PHOS: 38 U/L (ref 38–126)
ALT: 14 U/L (ref 14–54)
ANION GAP: 9 (ref 5–15)
AST: 18 U/L (ref 15–41)
Albumin: 3.3 g/dL — ABNORMAL LOW (ref 3.5–5.0)
BILIRUBIN TOTAL: 0.3 mg/dL (ref 0.3–1.2)
BUN: 6 mg/dL (ref 6–20)
CALCIUM: 10 mg/dL (ref 8.9–10.3)
CO2: 27 mmol/L (ref 22–32)
Chloride: 104 mmol/L (ref 101–111)
Creatinine, Ser: 0.69 mg/dL (ref 0.44–1.00)
GFR calc non Af Amer: >60 mL/min (ref >60)
Glucose, Bld: 104 mg/dL — ABNORMAL HIGH (ref 65–99)
POTASSIUM: 4.6 mmol/L (ref 3.5–5.1)
SODIUM: 140 mmol/L (ref 135–145)
TOTAL PROTEIN: 5.8 g/dL — AB (ref 6.5–8.1)

## 2016-06-26 MED ORDER — HYDROMORPHONE HCL 2 MG/ML IJ SOLN
0.5000 mg | Freq: Once | INTRAMUSCULAR | Status: AC
  Administered 2016-06-26: 0.5 mg via INTRAVENOUS
  Filled 2016-06-26: qty 1

## 2016-06-26 NOTE — Discharge Planning (Signed)
Pt to go home with resumption of home health adding RN and NA.  Edwinna Areola of [facility] notified.

## 2016-06-26 NOTE — ED Notes (Signed)
Myself and Kathlee Nations, NT undressed patient, placed in gown, on monitor, continuous pulse oximetry, blood pressure cuff and oxygen [place] (2L)

## 2016-06-26 NOTE — ED Provider Notes (Signed)
[facility] DEPT Provider Note   CSN: [id number] Arrival date & time:        History   Chief Complaint Chief Complaint  Patient presents with  . Abdominal Pain    HPI Sarah Mcguire is a 75 y.o. female.  The pt is a 76 y/o female - she is back today after a several day admission to the hospital during which time she was evaluated for altered mental status after having what appeared to be a seizure. The patient did have witnessed seizure activity in the emergency department, this was treated in the inpatient setting and started on 2 different medications for seizures which she has been taking. The patient reported that she was having pain in her muscles and this was thought to be related to hypokalemia and low magnesium. In the hospital the patient was seen by physical therapy who helped her to ambulate, she was discharged home on her chronic Coumadin therapy which is for her atrial fibrillation as well as history of DVT. At home the patient was immediately unable to walk and has been bedbound since that time stating that her leg hurts too much. She has had home health arranged but they have not yet come out of the house stating that they cannot help her if she is unable to walk. This is according to the husband who accompanies the patient to the hospital. The patient is also known to have diabetes which is controlled with sliding scale insulin, congestive heart failure with a recent heart catheterization in September 2017 at which time she had nonobstructive coronary disease. She has a pancreatic mass and is post undergo endoscopic ultrasound with biopsy in the outpatient setting but has not yet seen gastroenterology to arrange this. She also had recent diverticulitis and had been on antibiotics for that, she was encouraged to finish her course of Cipro and Flagyl in the outpatient setting. The patient has been known to have significant and severe anxiety for which she takes 1 mg of Xanax 6  times a day. On my initial exam of the patient she states that she wants her husband to be in the room but refuses to talk to me other than saying that her legs hurt. The husband reports that she has been having increased amounts of pain, she does not have pain when she is lying down only when she moves her leg. He denies swelling of the legs, there is no shortness of breath. The patient does not give much more history other than refusing to go to a nursing home when asked about what kind of care they feel they need.    Abdominal Pain      Past Medical History:  Diagnosis Date  . Anemia   . Anxiety    SEVERE  . Arthritis    "probably in my back" (05/08/2016)  . Atrial fibrillation with RVR ([place]) 11/28/2012  . Chronic lower back pain   . Coronary artery disease   . Diverticulitis   . DVT (deep venous thrombosis) ([place])   . Emphysema of lung ([place])   . GERD (gastroesophageal reflux disease)   . High cholesterol   . Hyperglycemia   . Hyperlipidemia   . Hypertension   . Impaired mobility    ambulates with walker.  . Leukocytosis   . Multiple drug allergies   . Neuropathy ([place])   . On home oxygen therapy    "2L; 24/7 prn" (05/08/2016)  . PE (pulmonary embolism)    Bilateral PE,  stringy saddle embolus with predominant proximal lower lobe pulmonary artery involvement 06/26/12  . Type II diabetes mellitus [facility])     Patient Active Problem List   Diagnosis Date Noted  . Seizures ([place]) 06/20/2016  . Seizure disorder ([place])   . Seizure ([place]) 06/19/2016  . Hyponatremia   . Seizure-like activity ([place])   . Altered mental status   . Sigmoid diverticulitis 06/10/2016  . Pancreatic mass 06/10/2016  . Cholelithiases 06/10/2016  . Diverticulitis 06/10/2016  . Gallstones   . Hypotension   . Chronic systolic CHF (congestive heart failure) ([place]) 05/30/2016  . COPD (chronic obstructive pulmonary disease) ([place]) 04/02/2016  . Chronic hypoxemic respiratory failure ([place]) 03/19/2016  .  Hypothyroidism 09/14/2015  . Borderline low O2 saturation 05/24/2015  . Premature atrial contraction 10/21/2014  . Abnormal EKG 10/21/2014  . History of pulmonary embolism 10/21/2014  . Right hip pain 08/01/2014  . Vaginitis and vulvovaginitis 03/22/2014  . UTI symptoms 03/22/2014  . Knee pain 01/11/2014  . External hemorrhoid, bleeding 11/02/2013  . Encounter for therapeutic drug monitoring 10/15/2013  . Cataract of both eyes 08/03/2013  . Right groin pain 03/31/2013  . Insomnia 12/09/2012  . Atrial fibrillation ([place]) 11/28/2012  . Osteoarthritis of left knee 11/26/2012  . Foot pain 11/03/2012  . Left knee pain 10/31/2012  . Long term current use of anticoagulant therapy 07/16/2012  . PE (pulmonary embolism) 06/30/2012  . GERD (gastroesophageal reflux disease) 06/26/2012  . Anxiety 06/26/2012  . DJD (degenerative joint disease) 06/26/2012  . Palpitations 06/25/2012  . Chest pain 02/13/2012  . Fatigue 02/01/2012  . Panic attack 11/13/2011  . Osteoporosis 07/09/2011  . Right sided abdominal pain 03/26/2011  . Plantar fasciitis 03/13/2011  . DYSURIA 10/24/2010  . RHINITIS 10/02/2010  . CARDIOMYOPATHY, DILATED 08/01/2010  . OBESITY 05/05/2010  . HIP PAIN 02/28/2010  . PLANTAR FASCIITIS, RIGHT 02/24/2010  . EDEMA- LOCALIZED 02/20/2010  . PORTAL VEIN THROMBOSIS 01/23/2010  . CANDIDIASIS OF MOUTH 01/02/2010  . Anemia 01/02/2010  . Leukocytosis 01/02/2010  . THROMBOCYTOSIS 01/02/2010  . GERD 01/02/2010  . VAGINITIS, ATROPHIC 12/05/2009  . BACK PAIN, CHRONIC 11/08/2009  . Diabetes mellitus with neuropathy ([place]) 11/07/2009  . SMOKER 08/31/2009  . UTI 06/13/2009  . VAGINITIS 06/06/2009  . EUSTACHIAN TUBE DYSFUNCTION 03/28/2009  . HEART MURMUR, SYSTOLIC 0000000  . SINUSITIS- ACUTE-NOS 11/01/2008  . DIVERTICULITIS OF COLON 09/06/2008  . Hyperlipidemia associated with type 2 diabetes mellitus ([place]) 07/30/2008  . Anxiety and depression 07/30/2008  . Essential hypertension  07/30/2008    Past Surgical History:  Procedure Laterality Date  . CARDIAC CATHETERIZATION N/A 05/30/2016   Procedure: Left Heart Cath and Coronary Angiography;  Surgeon: Peter M Martinique, MD;  Location: [facility];  Service: Cardiovascular;  Laterality: N/A;  . CATARACT EXTRACTION W/ INTRAOCULAR LENS  IMPLANT, BILATERAL Bilateral 08/2013  . JOINT REPLACEMENT    . TONSILLECTOMY    . TOTAL KNEE ARTHROPLASTY Left 11/24/2012   Procedure: TOTAL KNEE ARTHROPLASTY;  Surgeon: Kerin Salen, MD;  Location: [facility];  Service: Orthopedics;  Laterality: Left;  . TYMPANOSTOMY TUBE PLACEMENT Right early 2000s    OB History    Gravida Para Term Preterm AB Living   2 2 2     2    SAB TAB Ectopic Multiple Live Births                   Home Medications    Prior to Admission medications   Medication Sig Start Date End Date  Taking? Authorizing Provider  acetaminophen (TYLENOL) 325 MG tablet Take 325-650 mg by mouth every 6 (six) hours as needed (for headache).    Yes Historical Provider, MD  albuterol (PROVENTIL HFA;VENTOLIN HFA) 108 (90 Base) MCG/ACT inhaler Inhale 2 puffs into the lungs every 6 (six) hours as needed for wheezing or shortness of breath. 04/17/16  Yes Tammy S Parrett, NP  alprazolam (XANAX) 2 MG tablet Take 1 tablet (2 mg total) by mouth 3 (three) times daily. Patient taking differently: Take 1 mg by mouth 6 (six) times daily.  06/13/16  Yes Theodis Blaze, MD  Cholecalciferol (VITAMIN D) 2000 UNITS tablet Take 2,000 Units by mouth daily.   Yes Historical Provider, MD  clobetasol cream (TEMOVATE) [id number] % Apply 1 application topically 2 (two) times daily as needed (for yeast infection).   Yes Historical Provider, MD  enoxaparin (LOVENOX) 80 MG/0.8ML injection Inject 0.8 mLs (80 mg total) into the skin every 12 (twelve) hours. Stop taking after the dose on October 24th, 2017 06/23/16  Yes Costin Karlyne Greenspan, MD  esomeprazole (NEXIUM) 40 MG capsule Take 1 capsule (40 mg total) by mouth  daily as needed. For acid reflux. Patient taking differently: Take 40 mg by mouth daily at 12 noon. For acid reflux. 05/24/15  Yes Midge Minium, MD  fenofibrate 160 MG tablet Take 1 tablet (160 mg total) by mouth daily. 03/20/16  Yes Midge Minium, MD  Fluticasone Furoate-Vilanterol (BREO ELLIPTA IN) Inhale 1 puff into the lungs daily.   Yes Historical Provider, MD  folic acid (FOLVITE) 1 MG tablet Take 1 tablet (1 mg total) by mouth daily. 05/31/14  Yes Midge Minium, MD  furosemide (LASIX) 40 MG tablet TAKE 1 TABLET BY MOUTH EVERY DAY 04/23/16  Yes Midge Minium, MD  HYDROcodone-acetaminophen (NORCO/VICODIN) 5-325 MG tablet Take 1-2 tablets by mouth every 4 (four) hours as needed. Patient taking differently: Take 0.5 tablets by mouth every 4 (four) hours as needed for moderate pain or severe pain.  06/13/16  Yes Theodis Blaze, MD  Lacosamide 100 MG TABS Take 1 tablet (100 mg total) by mouth 2 (two) times daily. 06/23/16  Yes Costin Karlyne Greenspan, MD  levETIRAcetam (KEPPRA) 750 MG tablet Take 2 tablets (1,500 mg total) by mouth 2 (two) times daily. 06/23/16  Yes Costin Karlyne Greenspan, MD  levothyroxine (SYNTHROID, LEVOTHROID) 50 MCG tablet TAKE 1 TABLET (50 MCG TOTAL) BY MOUTH DAILY. 06/18/16  Yes Midge Minium, MD  lisinopril (PRINIVIL,ZESTRIL) 10 MG tablet TAKE 1 TABLET BY MOUTH EVERY DAY Patient taking differently: Take 10 mg by mouth every day 05/28/16  Yes Midge Minium, MD  magnesium oxide (MAG-OX) 400 (241.3 Mg) MG tablet Take 1 tablet (400 mg total) by mouth 2 (two) times daily. 06/23/16  Yes Costin Karlyne Greenspan, MD  metFORMIN (GLUCOPHAGE-XR) 500 MG 24 hr tablet Take 500-1,000 mg by mouth See admin instructions. Pt takes 1000mg  in morning after breakfast, 500mg  in evening after dinner   Yes Historical Provider, MD  metoprolol succinate (TOPROL XL) 25 MG 24 hr tablet Take 1 tablet (25 mg total) by mouth daily. 05/22/16  Yes Eileen Stanford, PA-C  Multiple Vitamin  (MULTIVITAMIN) tablet Take 1 tablet by mouth daily.     Yes Historical Provider, MD  OXYGEN Inhale 2 L into the lungs daily as needed (during activity and while sleeping).    Yes Historical Provider, MD  potassium chloride SA (K-DUR,KLOR-CON) 20 MEQ tablet Take 1 tablet (20 mEq total) by  mouth daily. 06/23/16  Yes Costin Karlyne Greenspan, MD  sertraline (ZOLOFT) 25 MG tablet Take 1 tablet (25 mg total) by mouth at bedtime. 06/04/16  Yes Midge Minium, MD  simvastatin (ZOCOR) 20 MG tablet TAKE 1 TABLET BY MOUTH EVERY EVENING Patient taking differently: Take 20 mg by mouth every evening 05/28/16  Yes Midge Minium, MD  terconazole (TERAZOL 7) 0.4 % vaginal cream Place 1 applicator vaginally at bedtime as needed (itching).   Yes Historical Provider, MD  ciprofloxacin (CIPRO) 500 MG tablet Take 1 tablet (500 mg total) by mouth 2 (two) times daily. Patient not taking: Reported on 06/26/2016 06/13/16   Theodis Blaze, MD  metroNIDAZOLE (FLAGYL) 500 MG tablet Take 1 tablet (500 mg total) by mouth every 8 (eight) hours. Patient not taking: Reported on 06/26/2016 06/13/16   Theodis Blaze, MD    Family History Family History  Problem Relation Age of Onset  . Diabetes Mother   . Hypertension Mother   . Heart disease Father     MI age 64's   . Diabetes Brother   . Hypertension Brother     Social History Social History  Substance Use Topics  . Smoking status: Former Smoker    Packs/day: 0.50    Years: 60.00    Types: Cigarettes    Quit date: 03/18/2016  . Smokeless tobacco: Never Used  . Alcohol use No     Allergies   Morphine and related; Cymbalta [duloxetine hcl]; Amoxicillin; Amoxicillin-pot clavulanate; Citalopram hydrobromide; Clonazepam; Macrobid [nitrofurantoin macrocrystal]; Penicillins; Pyridium [phenazopyridine hcl]; Sertraline hcl; Wellbutrin [bupropion]; and Zonegran   Review of Systems Review of Systems  Gastrointestinal: Positive for abdominal pain.  All other systems  reviewed and are negative.    Physical Exam Updated Vital Signs BP 118/80   Pulse 89   Temp 97.9 F (36.6 C) (Oral)   Resp 16   LMP  (LMP Unknown)   SpO2 99%   Physical Exam  Constitutional: She appears well-developed and well-nourished. No distress.  HENT:  Head: Normocephalic and atraumatic.  Mouth/Throat: Oropharynx is clear and moist. No oropharyngeal exudate.  Eyes: Conjunctivae and EOM are normal. Pupils are equal, round, and reactive to light. Right eye exhibits no discharge. Left eye exhibits no discharge. No scleral icterus.  Neck: Normal range of motion. Neck supple. No JVD present. No thyromegaly present.  Cardiovascular: Normal rate, regular rhythm, normal heart sounds and intact distal pulses.  Exam reveals no gallop and no friction rub.   No murmur heard. Pulmonary/Chest: Effort normal and breath sounds normal. No respiratory distress. She has no wheezes. She has no rales.  Abdominal: Soft. Bowel sounds are normal. She exhibits no distension and no mass. There is no tenderness.  Musculoskeletal: Normal range of motion. She exhibits no edema or tenderness.  The patient has pain and tenderness with palpation over the right thigh as well as the right foot on the plantar aspect of the lateral foot. There is no bruising swelling and asymmetry or other abnormal appearance of the legs. She has very supple exam of all of her joints of the lower extremities and upper extremities and soft compartments.  Lymphadenopathy:    She has no cervical adenopathy.  Neurological: She is alert. Coordination normal.  The patient closes her eyes throughout the entire history and exam, she refuses to give much information, when I bring up the possibility of nursing home placement she opens her eyes and becomes more verbal stating that she will not go  to a nursing home. She is able to follow up my commands including straight leg raise bilaterally  Skin: Skin is warm and dry. No rash noted. No  erythema.  Psychiatric: She has a normal mood and affect. Her behavior is normal.  Nursing note and vitals reviewed.    ED Treatments / Results  Labs (all labs ordered are listed, but only abnormal results are displayed) Labs Reviewed  CBC WITH DIFFERENTIAL/PLATELET - Abnormal; Notable for the following:       Result Value   Hemoglobin 10.4 (*)    HCT 33.1 (*)    RDW 18.2 (*)    All other components within normal limits  COMPREHENSIVE METABOLIC PANEL - Abnormal; Notable for the following:    Glucose, Bld 104 (*)    Total Protein 5.8 (*)    Albumin 3.3 (*)    All other components within normal limits  CK - Abnormal; Notable for the following:    Total CK 13 (*)    All other components within normal limits     Radiology Dg Foot Complete Right  Result Date: 06/26/2016 CLINICAL DATA:  Right foot pain for couple of days EXAM: RIGHT FOOT COMPLETE - 3+ VIEW COMPARISON:  06/18/2016 FINDINGS: Three views of the right foot submitted. No acute fracture or subluxation. There is diffuse osteopenia. Plantar spur of calcaneus again noted. IMPRESSION: Negative. Electronically Signed   By: Lahoma Crocker M.D.   On: 06/26/2016 14:11    Procedures Procedures (including critical care time)  Medications Ordered in ED Medications  HYDROmorphone (DILAUDID) injection 0.5 mg (0.5 mg Intravenous Given 06/26/16 1329)     Initial Impression / Assessment and Plan / ED Course  I have reviewed the triage vital signs and the nursing notes.  Pertinent labs & imaging results that were available during my care of the patient were reviewed by me and considered in my medical decision making (see chart for details).  Clinical Course   After reading through the chart it is difficult to know exactly how compliant the patient is with physical therapy. She appears to be intact with severe anxiety, she does not appear to be terribly compliant with what I am asking her to do, her husband is very supportive but  does not know what to do with her since she is essentially not getting out of bed to walk or help at all. Home health has not yet been in the house, he does not feel like he can take care of her in this setting. I will obtain some lab work including a repeat potassium as well as a creatine kinase to rule out rhabdomyolysis. She will be given some pain medication and I will consult case management.  The pt has been able to ambulate with assistance Has had normal K, Normal Na, unremarkable xray Pt husband wants to take her home -  Case management will help get additional nursing / at home. Offered rehab - spouse and patient refused.   Final Clinical Impressions(s) / ED Diagnoses   Final diagnoses:  Right foot pain    New Prescriptions New Prescriptions   No medications on file     Noemi Chapel, MD 06/26/16 1557

## 2016-06-26 NOTE — ED Notes (Signed)
Pt voids 200 cc to bedpan

## 2016-06-26 NOTE — ED Notes (Signed)
Pt returned from XR. Pt on monitors 

## 2016-06-26 NOTE — Care Management Note (Signed)
Case Management Note  Patient Details  Name: EVIA BARDOS MRN: [id number] Date of Birth: [birth year]  Subjective/Objective:                  75 y/o female - she is back today after a several day admission to the hospital during which time she was evaluated for altered mental status after having what appeared to be a seizure. The patient did have witnessed seizure activity in the emergency department, this was treated in the inpatient setting and started on 2 different medications for seizures which she has been taking. From home with spouse.  Action/Plan:   Expected Discharge Date:  06/29/16               Expected Discharge Plan:  [place]  In-House Referral:  NA  Discharge planning Services  CM Consult  Post Acute Care Choice:  Resumption of Svcs/PTA Provider Choice offered to:     DME Arranged:    DME Agency:     HH Arranged:    HH Agency:   [facility]  Status of Service:  In process, will continue to follow  If discussed at Long Length of Stay Meetings, dates discussed:    Additional Comments: Pt currently active with [facility] for PT/OT services; but [facility] has not actually seen pt in home yet due to availability of pt ans/or spouse.  Resumption of care requested. Edwinna Areola of [facility] notified.  No DME needs identified at this time.  Fuller Mandril, RN 06/26/2016, 1:44 PM

## 2016-06-26 NOTE — ED Triage Notes (Signed)
Pt arrives EMS with c/o abdominal pain, RUQ, with hx of pancreatic growth. DC from hospital Saturday for seizure.Pt talks continuosly "get my husband".

## 2016-06-26 NOTE — Telephone Encounter (Signed)
Caller with [facility] call pt to set up appt to come to home, pt husband stated that he needed to get Pt to [facility] first for a visit and not sure how he would do that since pt can not alk. Caller then stated he could come out and assist, husband would not commit to Barnabas Lister coming out to start any care.

## 2016-06-26 NOTE — ED Notes (Signed)
Husband states he understands instructions. Pt home via w/c with husband stable.

## 2016-06-26 NOTE — Discharge Instructions (Signed)
I offered to admission to a rehabilitation unit for ongoing physical therapy and nursing attention, but you have declined. if you feel like you are unable to continue at home without additional assistance she may return to the emergency department or call your family doctor to arrange this.

## 2016-06-26 NOTE — Telephone Encounter (Signed)
Pt must accept the services ordered for them (home health, PT/OT, [facility] care management) or we are not able to help them.

## 2016-06-26 NOTE — Telephone Encounter (Signed)
Called pt back in regards to this message, the phone kept ringing and could not leave a message.

## 2016-06-26 NOTE — ED Notes (Signed)
Gave Pt Diet Coke per Dr. Sabra Heck.

## 2016-06-27 ENCOUNTER — Other Ambulatory Visit: Payer: Self-pay | Admitting: *Deleted

## 2016-06-27 ENCOUNTER — Encounter: Payer: Self-pay | Admitting: *Deleted

## 2016-06-27 ENCOUNTER — Telehealth: Payer: Self-pay

## 2016-06-27 ENCOUNTER — Inpatient Hospital Stay: Payer: Medicare Other | Admitting: Hematology

## 2016-06-27 ENCOUNTER — Other Ambulatory Visit: Payer: Self-pay

## 2016-06-27 ENCOUNTER — Encounter (HOSPITAL_COMMUNITY): Payer: Self-pay | Admitting: *Deleted

## 2016-06-27 ENCOUNTER — Ambulatory Visit: Payer: Medicare Other

## 2016-06-27 DIAGNOSIS — R4182 Altered mental status, unspecified: Secondary | ICD-10-CM | POA: Diagnosis not present

## 2016-06-27 DIAGNOSIS — D649 Anemia, unspecified: Secondary | ICD-10-CM | POA: Diagnosis not present

## 2016-06-27 DIAGNOSIS — Z7901 Long term (current) use of anticoagulants: Secondary | ICD-10-CM | POA: Diagnosis not present

## 2016-06-27 DIAGNOSIS — K8689 Other specified diseases of pancreas: Secondary | ICD-10-CM | POA: Diagnosis not present

## 2016-06-27 DIAGNOSIS — I2510 Atherosclerotic heart disease of native coronary artery without angina pectoris: Secondary | ICD-10-CM | POA: Diagnosis not present

## 2016-06-27 DIAGNOSIS — L89151 Pressure ulcer of sacral region, stage 1: Secondary | ICD-10-CM | POA: Diagnosis not present

## 2016-06-27 DIAGNOSIS — Z7984 Long term (current) use of oral hypoglycemic drugs: Secondary | ICD-10-CM | POA: Diagnosis not present

## 2016-06-27 DIAGNOSIS — I110 Hypertensive heart disease with heart failure: Secondary | ICD-10-CM | POA: Diagnosis not present

## 2016-06-27 DIAGNOSIS — Z86711 Personal history of pulmonary embolism: Secondary | ICD-10-CM | POA: Diagnosis not present

## 2016-06-27 DIAGNOSIS — J449 Chronic obstructive pulmonary disease, unspecified: Secondary | ICD-10-CM | POA: Diagnosis not present

## 2016-06-27 DIAGNOSIS — I4891 Unspecified atrial fibrillation: Secondary | ICD-10-CM | POA: Diagnosis not present

## 2016-06-27 DIAGNOSIS — I5022 Chronic systolic (congestive) heart failure: Secondary | ICD-10-CM | POA: Diagnosis not present

## 2016-06-27 DIAGNOSIS — E1142 Type 2 diabetes mellitus with diabetic polyneuropathy: Secondary | ICD-10-CM | POA: Diagnosis not present

## 2016-06-27 DIAGNOSIS — E039 Hypothyroidism, unspecified: Secondary | ICD-10-CM | POA: Diagnosis not present

## 2016-06-27 NOTE — Telephone Encounter (Signed)
EUS scheduled, pt instructed and medications reviewed.  Patient instructions mailed to home.  Patient to call with any questions or concerns.  

## 2016-06-27 NOTE — Patient Outreach (Signed)
[facility]) Care Management  06/27/2016  Sarah Mcguire Sep 08, 1940 [id number]   RN spoke with the consented spouse concerning Sarah Mcguire services. Note previous introduction earlier in this week prior to her ED visit on yesterday. Spouse states pt is walking now and [facility] is involved with PT and nursing. Spouse states they plan on going to the scheduled biopsy on Thursday. RN offered additional assist via [facility] concerning pt's other needs however spouse feels [facility] is supplying all the pt's needs at this time. Caregiver has opt to declined all of [facility] services at this time but may need in the future. Caregiver spouse understands the risk involved if pt is not monitored regularly with her ongoing issues. RN has informed spouse that Dr. Birdie Riddle will be notified that pt/caregiver has opt to decline [facility] services. No other contact will be made at this time concerning ongoing  transition of care contacts. Case will be closed.  Raina Mina, RN Care Management Coordinator [place] Office [phone]

## 2016-06-28 ENCOUNTER — Telehealth: Payer: Self-pay | Admitting: General Practice

## 2016-06-28 ENCOUNTER — Encounter (HOSPITAL_COMMUNITY)
Admission: RE | Disposition: A | Discharge status: Home / Self Care | Payer: Self-pay | Source: Ambulatory Visit | Attending: Gastroenterology

## 2016-06-28 ENCOUNTER — Ambulatory Visit (HOSPITAL_COMMUNITY): Payer: Medicare Other | Admitting: Certified Registered Nurse Anesthetist

## 2016-06-28 ENCOUNTER — Ambulatory Visit (HOSPITAL_COMMUNITY)
Admission: RE | Admit: 2016-06-28 | Discharge: 2016-06-28 | Disposition: A | Discharge status: Home / Self Care | Payer: Medicare Other | Source: Ambulatory Visit | Attending: Gastroenterology | Admitting: Gastroenterology

## 2016-06-28 ENCOUNTER — Encounter (HOSPITAL_COMMUNITY): Payer: Self-pay

## 2016-06-28 DIAGNOSIS — I110 Hypertensive heart disease with heart failure: Secondary | ICD-10-CM | POA: Diagnosis not present

## 2016-06-28 DIAGNOSIS — Z79899 Other long term (current) drug therapy: Secondary | ICD-10-CM | POA: Insufficient documentation

## 2016-06-28 DIAGNOSIS — Z9981 Dependence on supplemental oxygen: Secondary | ICD-10-CM | POA: Insufficient documentation

## 2016-06-28 DIAGNOSIS — C252 Malignant neoplasm of tail of pancreas: Secondary | ICD-10-CM | POA: Diagnosis not present

## 2016-06-28 DIAGNOSIS — E7800 Pure hypercholesterolemia, unspecified: Secondary | ICD-10-CM | POA: Insufficient documentation

## 2016-06-28 DIAGNOSIS — Z7951 Long term (current) use of inhaled steroids: Secondary | ICD-10-CM | POA: Insufficient documentation

## 2016-06-28 DIAGNOSIS — E039 Hypothyroidism, unspecified: Secondary | ICD-10-CM | POA: Diagnosis not present

## 2016-06-28 DIAGNOSIS — I082 Rheumatic disorders of both aortic and tricuspid valves: Secondary | ICD-10-CM | POA: Insufficient documentation

## 2016-06-28 DIAGNOSIS — K219 Gastro-esophageal reflux disease without esophagitis: Secondary | ICD-10-CM | POA: Diagnosis not present

## 2016-06-28 DIAGNOSIS — K8689 Other specified diseases of pancreas: Secondary | ICD-10-CM | POA: Diagnosis not present

## 2016-06-28 DIAGNOSIS — I2510 Atherosclerotic heart disease of native coronary artery without angina pectoris: Secondary | ICD-10-CM | POA: Insufficient documentation

## 2016-06-28 DIAGNOSIS — C259 Malignant neoplasm of pancreas, unspecified: Secondary | ICD-10-CM

## 2016-06-28 DIAGNOSIS — Z7901 Long term (current) use of anticoagulants: Secondary | ICD-10-CM | POA: Diagnosis not present

## 2016-06-28 DIAGNOSIS — I4891 Unspecified atrial fibrillation: Secondary | ICD-10-CM | POA: Insufficient documentation

## 2016-06-28 DIAGNOSIS — Z791 Long term (current) use of non-steroidal anti-inflammatories (NSAID): Secondary | ICD-10-CM | POA: Insufficient documentation

## 2016-06-28 DIAGNOSIS — J449 Chronic obstructive pulmonary disease, unspecified: Secondary | ICD-10-CM | POA: Diagnosis not present

## 2016-06-28 DIAGNOSIS — F419 Anxiety disorder, unspecified: Secondary | ICD-10-CM | POA: Diagnosis not present

## 2016-06-28 DIAGNOSIS — Z96652 Presence of left artificial knee joint: Secondary | ICD-10-CM | POA: Diagnosis not present

## 2016-06-28 DIAGNOSIS — K5732 Diverticulitis of large intestine without perforation or abscess without bleeding: Secondary | ICD-10-CM | POA: Insufficient documentation

## 2016-06-28 DIAGNOSIS — E1140 Type 2 diabetes mellitus with diabetic neuropathy, unspecified: Secondary | ICD-10-CM | POA: Diagnosis not present

## 2016-06-28 DIAGNOSIS — M1990 Unspecified osteoarthritis, unspecified site: Secondary | ICD-10-CM | POA: Insufficient documentation

## 2016-06-28 DIAGNOSIS — I10 Essential (primary) hypertension: Secondary | ICD-10-CM | POA: Diagnosis not present

## 2016-06-28 DIAGNOSIS — R933 Abnormal findings on diagnostic imaging of other parts of digestive tract: Secondary | ICD-10-CM | POA: Diagnosis not present

## 2016-06-28 DIAGNOSIS — J439 Emphysema, unspecified: Secondary | ICD-10-CM | POA: Insufficient documentation

## 2016-06-28 DIAGNOSIS — Z86718 Personal history of other venous thrombosis and embolism: Secondary | ICD-10-CM | POA: Diagnosis not present

## 2016-06-28 DIAGNOSIS — Z7984 Long term (current) use of oral hypoglycemic drugs: Secondary | ICD-10-CM | POA: Insufficient documentation

## 2016-06-28 DIAGNOSIS — Z86711 Personal history of pulmonary embolism: Secondary | ICD-10-CM | POA: Diagnosis not present

## 2016-06-28 DIAGNOSIS — K869 Disease of pancreas, unspecified: Secondary | ICD-10-CM | POA: Diagnosis not present

## 2016-06-28 DIAGNOSIS — I509 Heart failure, unspecified: Secondary | ICD-10-CM | POA: Diagnosis not present

## 2016-06-28 DIAGNOSIS — Z87891 Personal history of nicotine dependence: Secondary | ICD-10-CM | POA: Diagnosis not present

## 2016-06-28 HISTORY — PX: EUS: SHX5427

## 2016-06-28 LAB — GLUCOSE, CAPILLARY: GLUCOSE-CAPILLARY: 150 mg/dL — AB (ref 65–99)

## 2016-06-28 SURGERY — UPPER ENDOSCOPIC ULTRASOUND (EUS) RADIAL
Anesthesia: Monitor Anesthesia Care

## 2016-06-28 MED ORDER — FENTANYL CITRATE (PF) 100 MCG/2ML IJ SOLN
INTRAMUSCULAR | Status: DC | PRN
  Administered 2016-06-28 (×6): 25 ug via INTRAVENOUS

## 2016-06-28 MED ORDER — PROPOFOL 10 MG/ML IV BOLUS
INTRAVENOUS | Status: AC
  Filled 2016-06-28: qty 40

## 2016-06-28 MED ORDER — MIDAZOLAM HCL 2 MG/2ML IJ SOLN
INTRAMUSCULAR | Status: AC
  Filled 2016-06-28: qty 2

## 2016-06-28 MED ORDER — MIDAZOLAM HCL 5 MG/5ML IJ SOLN
INTRAMUSCULAR | Status: DC | PRN
  Administered 2016-06-28 (×2): 0.5 mg via INTRAVENOUS

## 2016-06-28 MED ORDER — LIDOCAINE HCL (CARDIAC) 20 MG/ML IV SOLN
INTRAVENOUS | Status: DC | PRN
  Administered 2016-06-28: 80 mg via INTRAVENOUS

## 2016-06-28 MED ORDER — FENTANYL CITRATE (PF) 100 MCG/2ML IJ SOLN
INTRAMUSCULAR | Status: AC
  Filled 2016-06-28: qty 4

## 2016-06-28 MED ORDER — FENTANYL CITRATE (PF) 100 MCG/2ML IJ SOLN
INTRAMUSCULAR | Status: AC
  Filled 2016-06-28: qty 2

## 2016-06-28 MED ORDER — PROPOFOL 10 MG/ML IV BOLUS
INTRAVENOUS | Status: AC
  Filled 2016-06-28: qty 20

## 2016-06-28 MED ORDER — LACTATED RINGERS IV SOLN
INTRAVENOUS | Status: DC | PRN
  Administered 2016-06-28 (×2): via INTRAVENOUS

## 2016-06-28 MED ORDER — PHENYLEPHRINE HCL 10 MG/ML IJ SOLN
INTRAMUSCULAR | Status: DC | PRN
  Administered 2016-06-28 (×2): 40 ug via INTRAVENOUS

## 2016-06-28 MED ORDER — PROPOFOL 10 MG/ML IV BOLUS
INTRAVENOUS | Status: DC | PRN
  Administered 2016-06-28 (×2): 10 mg via INTRAVENOUS
  Administered 2016-06-28 (×2): 20 mg via INTRAVENOUS
  Administered 2016-06-28: 10 mg via INTRAVENOUS
  Administered 2016-06-28: 40 mg via INTRAVENOUS
  Administered 2016-06-28: 10 mg via INTRAVENOUS
  Administered 2016-06-28: 20 mg via INTRAVENOUS
  Administered 2016-06-28: 10 mg via INTRAVENOUS

## 2016-06-28 NOTE — Anesthesia Postprocedure Evaluation (Signed)
Anesthesia Post Note  Patient: Sarah Mcguire  Procedure(s) Performed: Procedure(s) (LRB): UPPER ENDOSCOPIC ULTRASOUND (EUS) RADIAL (N/A)  Patient location during evaluation: PACU Anesthesia Type: MAC Level of consciousness: awake and alert Pain management: pain level controlled Vital Signs Assessment: post-procedure vital signs reviewed and stable Respiratory status: spontaneous breathing, nonlabored ventilation, respiratory function stable and patient connected to nasal cannula oxygen Cardiovascular status: stable and blood pressure returned to baseline Anesthetic complications: no    Last Vitals:  Vitals:   06/28/16 1410 06/28/16 1440  BP:  (!) 152/86  Pulse: (!) 109   Resp:    Temp:      Last Pain:  Vitals:   06/28/16 1440  TempSrc:   PainSc: 4                  Lizette Pazos J

## 2016-06-28 NOTE — Op Note (Signed)
[facility] Patient Name: Sarah Mcguire Procedure Date: 06/28/2016 MRN: [id number] Attending MD: [name] , MD Date of Birth: 1941/05/26 CSN: [id number] Age: 75 Admit Type: Outpatient Procedure:                Upper EUS Indications:              Suspected mass in tail of pancreas, spleen on                            recent CT scan: CA 19-9 10,000 Providers:                [name], MD, Truddie Coco, RN, William Dalton, Technician Referring MD:             Dr. Truitt Merle; Dr. Silvano Rusk Medicines:                Monitored Anesthesia Care Complications:            No immediate complications. Estimated blood loss:                            None. Estimated Blood Loss:     Estimated blood loss: none. Procedure:                Pre-Anesthesia Assessment:                           - Prior to the procedure, a History and Physical                            was performed, and patient medications and                            allergies were reviewed. The patient's tolerance of                            previous anesthesia was also reviewed. The risks                            and benefits of the procedure and the sedation                            options and risks were discussed with the patient.                            All questions were answered, and informed consent                            was obtained. Prior Anticoagulants: The patient has                            taken Coumadin (warfarin), last dose was 5 days  prior to procedure. ASA Grade Assessment: III - A                            patient with severe systemic disease. After                            reviewing the risks and benefits, the patient was                            deemed in satisfactory condition to undergo the                            procedure.                           After obtaining informed consent, the endoscope was                             passed under direct vision. Throughout the                            procedure, the patient's blood pressure, pulse, and                            oxygen saturations were monitored continuously. The                            [id number]) scope was introduced through                            the mouth, and advanced to the second part of                            duodenum. The upper EUS was accomplished without                            difficulty. The patient tolerated the procedure                            well. Findings:      Endoscopic Finding :      The examined esophagus was endoscopically normal.      The entire examined stomach was endoscopically normal.      The examined duodenum was endoscopically normal.      Endosonographic Finding :      1. A mass involving the tail of pancreas was incompletely visualized       from the proximal stomach. The mass was hypoechoic and heterogenous. The       endosonographic borders were poorly-defined. This measures at least 3cm       but this is an obvious underestimate of the size (I could not see the       entire mass in one [place] field). Fine needle aspiration for cytology was       performed. Three passes were made with the 25 gauge needle using a       transgastric approach.  A cytotechnologist was present to evaluate the       adequacy of the specimen.      2. The mass does not involve celiac trunk, SMA, SMV or portal vein but       clearly involves the splenic vessels on this exam and on recent CT scan. Impression:               - Mass in tail of pancreas that was incompletely                            visualized on this examination due to it's depth of                            invasion (on CT it clearly involves the spleen).                           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 Moderate Sedation:      N/A- Per  Anesthesia Care Recommendation:           - Discharge patient to home (ambulatory).                           - Will alert Dr. Burr Medico of the biopsy results. Procedure Code(s):        --- Professional ---                           [phone], Esophagogastroduodenoscopy, flexible,                            transoral; with transendoscopic ultrasound-guided                            intramural or transmural fine needle                            aspiration/biopsy(s), (includes endoscopic                            ultrasound examination limited to the esophagus,                            stomach or duodenum, and adjacent structures) Diagnosis Code(s):        --- Professional ---                           K86.89, Other specified diseases of pancreas                           R93.3, Abnormal findings on diagnostic imaging of                            other parts of digestive tract CPT copyright 2016 [employer]. All rights reserved. The codes documented  in this report are preliminary and upon coder review may  be revised to meet current compliance requirements. [name], MD 06/28/2016 1:58:12 PM This report has been signed electronically. Number of Addenda: 0

## 2016-06-28 NOTE — Anesthesia Preprocedure Evaluation (Addendum)
Anesthesia Evaluation  Patient identified by MRN, date of birth, ID band Patient awake  General Assessment Comment:Past Medical History: Diagnosis Date . Anemia  . Anxiety   SEVERE . Arthritis   "probably in my back" (05/08/2016) . Atrial fibrillation with RVR ([place]) 11/28/2012 . Chronic lower back pain  . Coronary artery disease  . Diverticulitis  . DVT (deep venous thrombosis) ([place])  . Emphysema of lung ([place])  . GERD (gastroesophageal reflux disease)  . High cholesterol  . Hyperglycemia  . Hyperlipidemia  . Hypertension  . Impaired mobility   ambulates with walker. . Leukocytosis  . Multiple drug allergies  . Neuropathy ([place])  . On home oxygen therapy   "2L; 24/7 prn" (05/08/2016) . PE (pulmonary embolism)   Bilateral PE, stringy saddle embolus with predominant proximal lower lobe pulmonary artery involvement 06/26/12 . Type II diabetes mellitus ([place])     Reviewed: Allergy & Precautions, NPO status , Patient's Chart, lab work & pertinent test results  Airway Mallampati: II  TM Distance: >3 FB Neck ROM: Full    Dental no notable dental hx.    Pulmonary COPD, former smoker,    Pulmonary exam normal breath sounds clear to auscultation       Cardiovascular hypertension, + CAD and +CHF  Normal cardiovascular exam+ Valvular Problems/Murmurs  Rhythm:Regular Rate:Normal  ECHO: 05-21-16:  Study Conclusions  - Left ventricle: The cavity size was normal. There was mild focal   basal hypertrophy of the septum. Systolic function was severely   reduced. The estimated ejection fraction was in the range of 25%   to 30%. Wall motion was normal; there were no regional wall   motion abnormalities. Doppler parameters are consistent with   abnormal left ventricular relaxation (grade 1 diastolic   dysfunction). There was no evidence of elevated ventricular   filling pressure by Doppler parameters. - Aortic  valve: There was trivial regurgitation. - Aortic root: The aortic root was normal in size. - Right ventricle: Systolic function was mildly reduced. - Right atrium: The atrium was normal in size. - Tricuspid valve: There was no regurgitation. - Pulmonary arteries: Systolic pressure was within the normal   range. - Inferior vena cava: The vessel was normal in size. - Pericardium, extracardiac: There was no pericardial effusion.  Impressions:  - Compared to the prior study from 11/28/2012 LVEF has decreased   from 40-45% to 25-30%, RVEF is moderately decreased.   Neuro/Psych Seizures -,  PSYCHIATRIC DISORDERS Anxiety    GI/Hepatic Neg liver ROS, GERD  ,  Endo/Other  diabetes, Type 2Hypothyroidism   Renal/GU negative Renal ROS  negative genitourinary   Musculoskeletal  (+) Arthritis ,   Abdominal   Peds negative pediatric ROS (+)  Hematology  (+) anemia ,   Anesthesia Other Findings   Reproductive/Obstetrics negative OB ROS                           Anesthesia Physical Anesthesia Plan  ASA: IV  Anesthesia Plan: MAC   Post-op Pain Management:    Induction: Intravenous  Airway Management Planned: Natural Airway  Additional Equipment:   Intra-op Plan:   Post-operative Plan:   Informed Consent: I have reviewed the patients History and Physical, chart, labs and discussed the procedure including the risks, benefits and alternatives for the proposed anesthesia with the patient or authorized representative who has indicated his/her understanding and acceptance.   Dental advisory given  Plan Discussed with: CRNA  Anesthesia Plan  Comments:        Anesthesia Quick Evaluation

## 2016-06-28 NOTE — OR Nursing (Signed)
Allevyn dressing noticed on pt's Sacral area.  Dressing Clean, Dry and intact.  Unable to assess actual wound.  Family educated on importance of turning pt. To keep pressure off of area.    Laverta Baltimore, RN

## 2016-06-28 NOTE — Telephone Encounter (Signed)
Faxed order to check INR on Friday, 06/29/16 and report results to Villa Herb, RN @ [phone].

## 2016-06-28 NOTE — Discharge Instructions (Signed)

## 2016-06-28 NOTE — H&P (View-Only) (Signed)
History and Physical  Sarah Mcguire [id number] DOB: 11-26-1940 DOA: 06/09/2016  PCP:  Annye Asa, MD   Chief Complaint:  Abdominal pain  History of Present Illness:  Patient is a 75 yo female with history of DVT, HTN, Afib, DMII who came with cc of abdominal pain, started 24 hours ago, in the right lower abdomen, with no diarrhea , nausea or vomiting. Last BM 24 hours ago with no dark/bloody stool. She had a fever today with some chills but no other complaints. She had a prior episode of diverticulitis a few years ago.   Review of Systems:  CONSTITUTIONAL:     No night sweats.  No fatigue.  +fever. +chills. Eyes:                            No visual changes.  No eye pain.  No eye discharge.   ENT:                              No epistaxis.  No sinus pain.  No sore throat.   No congestion. RESPIRATORY:           No cough.  No wheeze.  No hemoptysis.  No dyspnea CARDIOVASCULAR   :  No chest pains.  No palpitations. GASTROINTESTINAL:  +abdominal pain.  No nausea. No vomiting.  No diarrhea. No constipation.  No hematemesis.  No hematochezia.  No melena. GENITOURINARY:      No urgency.  No frequency.  No dysuria.  No hematuria.  No obstructive symptoms.  No discharge.  No pain.  MUSCULOSKELETAL:  No musculoskeletal pain.  No joint swelling.  No arthritis. NEUROLOGICAL:        No confusion.  No weakness. No headache. No seizure. PSYCHIATRIC:             No depression. No anxiety. No suicidal ideation. SKIN:                             No rashes.  No lesions.  No wounds. ENDOCRINE:                No weight loss.  No polydipsia.  No polyuria.  No polyphagia. HEMATOLOGIC:           No purpura.  No petechiae.  No bleeding.  ALLERGIC                 : No pruritus.  No angioedema Other:  Past Medical and Surgical History:   Past Medical History:  Diagnosis Date  . Anemia   . Anxiety    SEVERE  . Arthritis    "probably in my back" (05/08/2016)  . Atrial fibrillation with  RVR ([place]) 11/28/2012  . Chronic lower back pain   . Diverticulitis   . DVT (deep venous thrombosis) ([place])   . Emphysema of lung ([place])   . GERD (gastroesophageal reflux disease)   . High cholesterol   . Hyperglycemia   . Hyperlipidemia   . Hypertension   . Leukocytosis   . Neuropathy ([place])   . On home oxygen therapy    "2L; 24/7 prn" (05/08/2016)  . PE (pulmonary embolism)    Bilateral PE, stringy saddle embolus with predominant proximal lower lobe pulmonary artery involvement 06/26/12  . Type II diabetes mellitus ([place])    Past Surgical History:  Procedure  Laterality Date  . CARDIAC CATHETERIZATION N/A 05/30/2016   Procedure: Left Heart Cath and Coronary Angiography;  Surgeon: Peter M Martinique, MD;  Location: [facility];  Service: Cardiovascular;  Laterality: N/A;  . CATARACT EXTRACTION W/ INTRAOCULAR LENS  IMPLANT, BILATERAL Bilateral 08/2013  . JOINT REPLACEMENT    . TONSILLECTOMY    . TOTAL KNEE ARTHROPLASTY Left 11/24/2012   Procedure: TOTAL KNEE ARTHROPLASTY;  Surgeon: Kerin Salen, MD;  Location: [facility];  Service: Orthopedics;  Laterality: Left;  . TYMPANOSTOMY TUBE PLACEMENT Right early 2000s    Social History:   reports that she quit smoking about 2 months ago. Her smoking use included Cigarettes. She has a 30.00 pack-year smoking history. She has never used smokeless tobacco. She reports that she does not drink alcohol or use drugs.    Allergies  Allergen Reactions  . Morphine And Related Other (See Comments)    Usually drops her blood pressure  . Cymbalta [Duloxetine Hcl] Other (See Comments)    Nausea, headache and diarrhea   . Amoxicillin Other (See Comments)    Unknown Has patient had a PCN reaction causing immediate rash, facial/tongue/throat swelling, SOB or lightheadedness with hypotension:unsure Has patient had a PCN reaction causing severe rash involving mucus membranes or skin necrosis:unsure Has patient had a PCN reaction that required  hospitalization:unsure Has patient had a PCN reaction occurring within the last 10 years:unsure If all of the above answers are "NO", then may proceed with Cephalosporin use.     Marland Kitchen Amoxicillin-Pot Clavulanate Other (See Comments)    Has patient had a PCN reaction causing immediate rash, facial/tongue/throat swelling, SOB or lightheadedness with hypotension:unsure Has patient had a PCN reaction causing severe rash involving mucus membranes or skin necrosis:unsure Has patient had a PCN reaction that required hospitalization:unsure Has patient had a PCN reaction occurring within the last 10 years:unsure If all of the above answers are "NO", then may proceed with Cephalosporin use.    . Citalopram Hydrobromide Itching       . Clonazepam Itching  . Macrobid [Nitrofurantoin Macrocrystal] Nausea And Vomiting  . Penicillins Other (See Comments)    Unknown Has patient had a PCN reaction causing immediate rash, facial/tongue/throat swelling, SOB or lightheadedness with hypotension:unsure Has patient had a PCN reaction causing severe rash involving mucus membranes or skin necrosis:unsure Has patient had a PCN reaction that required hospitalization:unsure Has patient had a PCN reaction occurring within the last 10 years:unsure If all of the above answers are "NO", then may proceed with Cephalosporin use.      . Pyridium [Phenazopyridine Hcl] Other (See Comments)    headache  . Sertraline Hcl Anxiety  . Wellbutrin [Bupropion] Itching, Rash and Other (See Comments)    Itching, rash, hyper   . Zonegran Other (See Comments)    Unknown   . Zonisamide Other (See Comments)    Unknown     Family History  Problem Relation Age of Onset  . Diabetes Mother   . Hypertension Mother   . Heart disease Father     MI age 72's   . Diabetes Brother   . Hypertension Brother       Prior to Admission medications   Medication Sig Start Date End Date Taking? Authorizing Provider  acetaminophen  (TYLENOL) 325 MG tablet Take 325-650 mg by mouth every 6 (six) hours as needed (for headache).    Yes Historical Provider, MD  albuterol (PROVENTIL HFA;VENTOLIN HFA) 108 (90 Base) MCG/ACT inhaler Inhale 2 puffs into  the lungs every 6 (six) hours as needed for wheezing or shortness of breath. 04/17/16  Yes Tammy S Parrett, NP  alprazolam (XANAX) 2 MG tablet TAKE 1/2 TO 1 TABLET BY MOUTH 3 TIMES A DAY AS NEEDED FOR SLEEP OR ANXIETY Patient taking differently: TAKE 1 TABLET BY MOUTH THREE TIMES DAILY 05/11/16  Yes Midge Minium, MD  calcium citrate-vitamin D (CITRACAL+D) 315-200 MG-UNIT tablet Take 2 tablets by mouth daily.   Yes Historical Provider, MD  Cholecalciferol (VITAMIN D) 2000 UNITS tablet Take 2,000 Units by mouth daily.   Yes Historical Provider, MD  esomeprazole (NEXIUM) 40 MG capsule Take 1 capsule (40 mg total) by mouth daily as needed. For acid reflux. 05/24/15  Yes Midge Minium, MD  fenofibrate 160 MG tablet Take 1 tablet (160 mg total) by mouth daily. 03/20/16  Yes Midge Minium, MD  Fluticasone Furoate-Vilanterol (BREO ELLIPTA IN) Inhale 1 puff into the lungs daily.   Yes Historical Provider, MD  folic acid (FOLVITE) 1 MG tablet Take 1 tablet (1 mg total) by mouth daily. 05/31/14  Yes Midge Minium, MD  furosemide (LASIX) 40 MG tablet TAKE 1 TABLET BY MOUTH EVERY DAY Patient taking differently: Take 20 mg by mouth every day 04/23/16  Yes Midge Minium, MD  HYDROcodone-acetaminophen (NORCO/VICODIN) 5-325 MG tablet Take 1-2 tablets by mouth every 4 (four) hours as needed. Patient taking differently: Take 1-2 tablets by mouth every 4 (four) hours as needed for moderate pain.  06/04/16  Yes Midge Minium, MD  levothyroxine (SYNTHROID, LEVOTHROID) 50 MCG tablet Take 1 tablet (50 mcg total) by mouth daily. Patient taking differently: Take 50 mcg by mouth daily before breakfast.  12/19/15  Yes Midge Minium, MD  lisinopril (PRINIVIL,ZESTRIL) 10 MG tablet TAKE 1  TABLET BY MOUTH EVERY DAY Patient taking differently: Take 10 mg by mouth every day 05/28/16  Yes Midge Minium, MD  meloxicam (MOBIC) 7.5 MG tablet Take 7.5 mg by mouth daily as needed for pain.   Yes Historical Provider, MD  metFORMIN (GLUCOPHAGE-XR) 500 MG 24 hr tablet Take 500-1,000 mg by mouth See admin instructions. Pt takes 1000mg  in morning after breakfast, 500mg  in evening after dinner   Yes Historical Provider, MD  methocarbamol (ROBAXIN) 500 MG tablet Take 500 mg by mouth 3 (three) times daily.   Yes Historical Provider, MD  metoprolol succinate (TOPROL XL) 25 MG 24 hr tablet Take 1 tablet (25 mg total) by mouth daily. 05/22/16  Yes Eileen Stanford, PA-C  Multiple Vitamin (MULTIVITAMIN) tablet Take 1 tablet by mouth daily.     Yes Historical Provider, MD  sertraline (ZOLOFT) 25 MG tablet Take 1 tablet (25 mg total) by mouth at bedtime. 06/04/16  Yes Midge Minium, MD  simvastatin (ZOCOR) 20 MG tablet TAKE 1 TABLET BY MOUTH EVERY EVENING Patient taking differently: Take 20 mg by mouth every evening 05/28/16  Yes Midge Minium, MD  terconazole (TERAZOL 7) 0.4 % vaginal cream Place 1 applicator vaginally at bedtime as needed (itching).   Yes Historical Provider, MD  traMADol (ULTRAM) 50 MG tablet Take 50 mg by mouth every 6 (six) hours as needed for moderate pain.   Yes Historical Provider, MD  warfarin (COUMADIN) 5 MG tablet Take 2.5-5 mg by mouth See admin instructions. Pt takes 2.5mg  on Monday, Friday, Saturday - pt takes 5mg  Tuesday, Thursday, Sunday   Yes Historical Provider, MD  clobetasol cream (TEMOVATE) 0.05 % APPLY EXTERNALLY AT BEDTIME FOR ONE MONTH  THEN AS NEEDED FOR ITCHING Patient not taking: Reported on 06/09/2016 02/24/16   Anastasio Auerbach, MD  glucose blood (ONE TOUCH ULTRA TEST) test strip TEST TWICE A DAY AS DIRECTED 12/19/15   Midge Minium, MD  lidocaine (LIDODERM) 5 % Place 1 patch onto the skin daily as needed (for pain.). Remove & Discard patch  within 12 hours or as directed by MD    Historical Provider, MD  lidocaine (XYLOCAINE) 5 % ointment APPLY 1 APPLICATION TOPICALLY 3 (THREE) TIMES DAILY AS NEEDED. FOR ITCHING DIRECTLY TO THE SPOT Patient not taking: Reported on 06/09/2016 02/24/16   Anastasio Auerbach, MD  naproxen (NAPROSYN) 500 MG tablet Take 500 mg by mouth 2 (two) times daily as needed for mild pain.    Historical Provider, MD  ranitidine (ZANTAC) 150 MG tablet Take 1 tablet (150 mg total) by mouth 2 (two) times daily as needed for heartburn. Patient not taking: Reported on 06/09/2016 06/08/16   Leo Grosser, MD    Physical Exam: BP (!) 86/65   Pulse 96   Temp 97.8 F (36.6 C) (Oral)   Resp 23   LMP  (LMP Unknown)   SpO2 100%   GENERAL :   Alert and cooperative, and appears to be in no acute distress. HEAD:           normocephalic. EYES:            PERRL, EOMI.  vision is grossly intact. EARS:           hearing grossly intact. NOSE:           No nasal discharge. THROAT:     Oral cavity and pharynx normal.   NECK:          supple CARDIAC:    Normal S1 and S2. No gallop. No murmurs.  Vascular:     no peripheral edema.  LUNGS:       Clear to auscultation  ABDOMEN: Positive bowel sounds. Soft, nondistended, nontender ( just got dilaudid). No guarding or rebound.      MSK:           No joint erythema or tenderness. Normal muscular development. EXT           : No significant deformity or joint abnormality. Neuro        : Alert, oriented to person, place, and time.  SKIN:            No rash. No lesions. PSYCH:       No hallucination. Patient is not suicidal.          Labs on Admission:  Reviewed.   Radiological Exams on Admission: Ct Abdomen Pelvis Wo Contrast  Result Date: 06/09/2016 CLINICAL DATA:  Severe right-sided pain. EXAM: CT ABDOMEN AND PELVIS WITHOUT CONTRAST TECHNIQUE: Multidetector CT imaging of the abdomen and pelvis was performed following the standard protocol without IV contrast. COMPARISON:  None.  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 No other significant abnormalities in the lower chest. Hepatobiliary: Cholelithiasis is identified with mild distention of the gallbladder but no wall thickening or pericholecystic fluid. The liver is unremarkable. Pancreas: The pancreatic head, neck, and body are normal. There is abnormal soft tissue attenuation in the region of the pancreatic tail which is new since July of 2012. This masslike finding is between the stomach and spleen measuring 5.2 x 3.6 cm on image 25 with mild adjacent fat stranding and an attenuation of  28 Hounsfield units. This finding is new since 2012. Spleen: Normal in size without focal abnormality. Adrenals/Urinary Tract: Nodularity in the left adrenal gland is stable. The right adrenal gland is normal. There is a stone in the lower left kidney which is stable. No hydronephrosis. No ureterectasis or ureteral stones. Stomach/Bowel: The bladder is normal in appearance. The small bowel is unremarkable. Colonic diverticuli are identified. There is significant stranding around the sigmoid colon with associated colonic wall thickening. These findings are consistent with diverticulitis. Evaluation for abscess is limited without contrast but none are seen. No extraluminal gas identified. The remainder of the colon is unremarkable. The visualized appendix is normal as well. Vascular/Lymphatic: Atherosclerosis is seen in the aorta as well as the iliac and femoral vessels. No aneurysm. No adenopathy. Reproductive: Uterus and bilateral adnexa are unremarkable. Other: No other abnormalities. Musculoskeletal: No acute bony abnormalities. Grade 1 anterolisthesis of L4 versus L5 is stable. IMPRESSION: 1. Sigmoid diverticulitis. 2. 5.2 x 3.6 cm masslike abnormality in the region of the pancreatic tail, between the spleen and the stomach. This is not well assessed without contrast. It is possible this could represent a complex  fluid collection including a complex pseudocyst. However, a neoplasm is not excluded. Recommend clinical correlation. Recommend a CT scan with intravenous contrast for better assessment. 3. Cholelithiasis. 4. Atherosclerosis in the abdominal aorta. Electronically Signed   By: Dorise Bullion III M.D   On: 06/09/2016 22:26   US Abdomen Limited  Result Date: 06/10/2016 CLINICAL DATA:  Right-sided abdominal pain. EXAM: US ABDOMEN LIMITED - RIGHT UPPER QUADRANT COMPARISON:  CT scan from earlier today FINDINGS: Gallbladder: Shadowing stones are seen in the gallbladder with no Murphy's sign, wall thickening, or pericholecystic fluid. Common bile duct: Diameter: 6.8 mm Liver: No focal lesion identified. Within normal limits in parenchymal echogenicity. IMPRESSION: 1. Cholelithiasis with no wall thickening, pericholecystic fluid, or Murphy's sign. 2. The common bile duct measures 6.8 mm which is prominent but within normal limits for a patient of this age. Recommend correlation with labs. Electronically Signed   By: Dorise Bullion III M.D   On: 06/10/2016 00:22   Dg Chest Port 1 View  Result Date: 06/09/2016 CLINICAL DATA:  Acute onset of generalized chest pain and right lower quadrant abdominal pain. Initial encounter. EXAM: PORTABLE CHEST 1 VIEW COMPARISON:  Chest radiograph performed 05/07/2016, and CTA of the chest performed 03/19/2016 FINDINGS: The lungs are well-aerated. Minimal bibasilar atelectasis is noted. There is no evidence of pleural effusion or pneumothorax. The cardiomediastinal silhouette is mildly enlarged. No acute osseous abnormalities are seen. IMPRESSION: Minimal bibasilar atelectasis noted. Lungs otherwise clear. Mild cardiomegaly. Electronically Signed   By: Garald Balding M.D.   On: 06/09/2016 21:18     Assessment/Plan  Diverticulitis: Started on cipro/flagyl with pharmacy dosing NPO overnight Advance diet tomorrow  DMII: on low dose correction with hypoglycemic protocol.    HTN: hold BP meds  DVT: continue coumadin. INR therapeutic   Afib: CHADS =>2 , continue wafarin, holding BB for rate control due to lower BP on admission.  COPD: continue home meds  Input & Output: NA Lines & Tubes: PIV DVT prophylaxis: on AC GI prophylaxis: PPI Consultants: na Code Status: full Family Communication: at bedside   Disposition Plan: Obs    Gennaro Africa M.D [facility]

## 2016-06-28 NOTE — Interval H&P Note (Signed)
History and Physical Interval Note:  06/28/2016 11:33 AM  Sarah Mcguire  has presented today for surgery, with the diagnosis of pancreatic mass  The various methods of treatment have been discussed with the patient and family. After consideration of risks, benefits and other options for treatment, the patient has consented to  Procedure(s): UPPER ENDOSCOPIC ULTRASOUND (EUS) RADIAL (N/A) as a surgical intervention .  The patient's history has been reviewed, patient examined, no change in status, stable for surgery.  I have reviewed the patient's chart and labs.  Questions were answered to the patient's satisfaction.     [name]

## 2016-06-28 NOTE — Transfer of Care (Signed)
Immediate Anesthesia Transfer of Care Note  Patient: Sarah Mcguire  Procedure(s) Performed: Procedure(s): UPPER ENDOSCOPIC ULTRASOUND (EUS) RADIAL (N/A)  Patient Location: PACU and Endoscopy Unit  Anesthesia Type:MAC  Level of Consciousness: awake, lethargic and responds to stimulation  Airway & Oxygen Therapy: Patient Spontanous Breathing and Patient connected to nasal cannula oxygen  Post-op Assessment: Report given to RN, Post -op Vital signs reviewed and stable and Patient moving all extremities  Post vital signs: Reviewed and stable  Last Vitals:  Vitals:   06/28/16 1137  BP: 138/86  Pulse: (!) 104  Resp: (!) 40    Last Pain:  Vitals:   06/28/16 1137  PainSc: 10-Worst pain ever         Complications: No apparent anesthesia complications

## 2016-06-29 ENCOUNTER — Ambulatory Visit (INDEPENDENT_AMBULATORY_CARE_PROVIDER_SITE_OTHER): Payer: Medicare Other | Admitting: General Practice

## 2016-06-29 ENCOUNTER — Telehealth: Payer: Self-pay | Admitting: *Deleted

## 2016-06-29 ENCOUNTER — Encounter (HOSPITAL_COMMUNITY): Payer: Self-pay | Admitting: Gastroenterology

## 2016-06-29 DIAGNOSIS — L89151 Pressure ulcer of sacral region, stage 1: Secondary | ICD-10-CM | POA: Diagnosis not present

## 2016-06-29 DIAGNOSIS — Z7901 Long term (current) use of anticoagulants: Secondary | ICD-10-CM | POA: Diagnosis not present

## 2016-06-29 DIAGNOSIS — I4891 Unspecified atrial fibrillation: Secondary | ICD-10-CM | POA: Diagnosis not present

## 2016-06-29 DIAGNOSIS — I2510 Atherosclerotic heart disease of native coronary artery without angina pectoris: Secondary | ICD-10-CM | POA: Diagnosis not present

## 2016-06-29 DIAGNOSIS — Z7984 Long term (current) use of oral hypoglycemic drugs: Secondary | ICD-10-CM | POA: Diagnosis not present

## 2016-06-29 DIAGNOSIS — Z86711 Personal history of pulmonary embolism: Secondary | ICD-10-CM | POA: Diagnosis not present

## 2016-06-29 DIAGNOSIS — I110 Hypertensive heart disease with heart failure: Secondary | ICD-10-CM | POA: Diagnosis not present

## 2016-06-29 DIAGNOSIS — J449 Chronic obstructive pulmonary disease, unspecified: Secondary | ICD-10-CM | POA: Diagnosis not present

## 2016-06-29 DIAGNOSIS — K8689 Other specified diseases of pancreas: Secondary | ICD-10-CM | POA: Diagnosis not present

## 2016-06-29 DIAGNOSIS — I5022 Chronic systolic (congestive) heart failure: Secondary | ICD-10-CM | POA: Diagnosis not present

## 2016-06-29 DIAGNOSIS — D649 Anemia, unspecified: Secondary | ICD-10-CM | POA: Diagnosis not present

## 2016-06-29 DIAGNOSIS — E039 Hypothyroidism, unspecified: Secondary | ICD-10-CM | POA: Diagnosis not present

## 2016-06-29 DIAGNOSIS — E1142 Type 2 diabetes mellitus with diabetic polyneuropathy: Secondary | ICD-10-CM | POA: Diagnosis not present

## 2016-06-29 LAB — POCT INR: INR: 1.1

## 2016-06-29 NOTE — Patient Instructions (Signed)
Pre visit review using our clinic review tool, if applicable. No additional management support is needed unless otherwise documented below in the visit note. 

## 2016-06-29 NOTE — Telephone Encounter (Signed)
I will schedule.  Truitt Merle MD

## 2016-06-29 NOTE — Telephone Encounter (Signed)
Husband of patient called stating that MD [facility] consulted for this patient in the hospital. Husband states MD Burr Medico wanted to make have and office visit with this patient. If so, when would you like the office visit be scheduled. I will forward this information to the new patient referral coordinator.

## 2016-06-29 NOTE — Progress Notes (Signed)
I have reviewed and agree with the plan. 

## 2016-07-02 ENCOUNTER — Telehealth: Payer: Self-pay | Admitting: *Deleted

## 2016-07-02 ENCOUNTER — Other Ambulatory Visit: Payer: Self-pay | Admitting: Family Medicine

## 2016-07-02 DIAGNOSIS — I110 Hypertensive heart disease with heart failure: Secondary | ICD-10-CM | POA: Diagnosis not present

## 2016-07-02 DIAGNOSIS — I5022 Chronic systolic (congestive) heart failure: Secondary | ICD-10-CM | POA: Diagnosis not present

## 2016-07-02 DIAGNOSIS — E039 Hypothyroidism, unspecified: Secondary | ICD-10-CM | POA: Diagnosis not present

## 2016-07-02 DIAGNOSIS — I2510 Atherosclerotic heart disease of native coronary artery without angina pectoris: Secondary | ICD-10-CM | POA: Diagnosis not present

## 2016-07-02 DIAGNOSIS — J449 Chronic obstructive pulmonary disease, unspecified: Secondary | ICD-10-CM | POA: Diagnosis not present

## 2016-07-02 DIAGNOSIS — I4891 Unspecified atrial fibrillation: Secondary | ICD-10-CM | POA: Diagnosis not present

## 2016-07-02 DIAGNOSIS — Z7984 Long term (current) use of oral hypoglycemic drugs: Secondary | ICD-10-CM | POA: Diagnosis not present

## 2016-07-02 DIAGNOSIS — K8689 Other specified diseases of pancreas: Secondary | ICD-10-CM | POA: Diagnosis not present

## 2016-07-02 DIAGNOSIS — Z7901 Long term (current) use of anticoagulants: Secondary | ICD-10-CM | POA: Diagnosis not present

## 2016-07-02 DIAGNOSIS — Z86711 Personal history of pulmonary embolism: Secondary | ICD-10-CM | POA: Diagnosis not present

## 2016-07-02 DIAGNOSIS — E1142 Type 2 diabetes mellitus with diabetic polyneuropathy: Secondary | ICD-10-CM | POA: Diagnosis not present

## 2016-07-02 DIAGNOSIS — D649 Anemia, unspecified: Secondary | ICD-10-CM | POA: Diagnosis not present

## 2016-07-02 DIAGNOSIS — L89151 Pressure ulcer of sacral region, stage 1: Secondary | ICD-10-CM | POA: Diagnosis not present

## 2016-07-02 MED ORDER — HYDROCODONE-ACETAMINOPHEN 5-325 MG PO TABS: 1.0000 | ORAL_TABLET | ORAL | 0 refills | Status: AC | PRN

## 2016-07-02 NOTE — Telephone Encounter (Signed)
Called husband with appointment to see Dr. Burr Medico on 07/04/16 at 12:30/1:00pm.

## 2016-07-02 NOTE — Telephone Encounter (Signed)
Med filled and sent to [facility] location as requested.

## 2016-07-02 NOTE — Telephone Encounter (Signed)
Last OV 06/04/16 Hydrocodone last filled 06/13/16 (hospital) per their notes she is only taking 0.5 tablets every 4 hours prn

## 2016-07-02 NOTE — Telephone Encounter (Signed)
Patient's husband Huda Tingen) calling on behalf of patient to request refill of HYDROcodone-acetaminophen (NORCO/VICODIN) 5-325 MG tablet.  Please send rx to [facility] office.

## 2016-07-04 ENCOUNTER — Telehealth: Payer: Self-pay | Admitting: *Deleted

## 2016-07-04 ENCOUNTER — Ambulatory Visit (INDEPENDENT_AMBULATORY_CARE_PROVIDER_SITE_OTHER): Payer: Self-pay | Admitting: General Practice

## 2016-07-04 ENCOUNTER — Ambulatory Visit: Payer: Medicare Other | Admitting: Family Medicine

## 2016-07-04 ENCOUNTER — Ambulatory Visit (HOSPITAL_BASED_OUTPATIENT_CLINIC_OR_DEPARTMENT_OTHER): Payer: Medicare Other | Admitting: Hematology

## 2016-07-04 ENCOUNTER — Encounter: Payer: Self-pay | Admitting: Hematology

## 2016-07-04 ENCOUNTER — Inpatient Hospital Stay: Payer: Medicare Other | Admitting: Hematology

## 2016-07-04 VITALS — BP 84/56 | HR 102 | Temp 97.7°F | Resp 18 | Ht 63.0 in

## 2016-07-04 DIAGNOSIS — Z7901 Long term (current) use of anticoagulants: Secondary | ICD-10-CM | POA: Diagnosis not present

## 2016-07-04 DIAGNOSIS — L89151 Pressure ulcer of sacral region, stage 1: Secondary | ICD-10-CM | POA: Diagnosis not present

## 2016-07-04 DIAGNOSIS — I2510 Atherosclerotic heart disease of native coronary artery without angina pectoris: Secondary | ICD-10-CM | POA: Diagnosis not present

## 2016-07-04 DIAGNOSIS — I10 Essential (primary) hypertension: Secondary | ICD-10-CM | POA: Diagnosis not present

## 2016-07-04 DIAGNOSIS — I5022 Chronic systolic (congestive) heart failure: Secondary | ICD-10-CM | POA: Diagnosis not present

## 2016-07-04 DIAGNOSIS — C252 Malignant neoplasm of tail of pancreas: Secondary | ICD-10-CM

## 2016-07-04 DIAGNOSIS — I952 Hypotension due to drugs: Secondary | ICD-10-CM | POA: Diagnosis not present

## 2016-07-04 DIAGNOSIS — E119 Type 2 diabetes mellitus without complications: Secondary | ICD-10-CM | POA: Diagnosis not present

## 2016-07-04 DIAGNOSIS — D649 Anemia, unspecified: Secondary | ICD-10-CM | POA: Diagnosis not present

## 2016-07-04 DIAGNOSIS — J449 Chronic obstructive pulmonary disease, unspecified: Secondary | ICD-10-CM | POA: Diagnosis not present

## 2016-07-04 DIAGNOSIS — I110 Hypertensive heart disease with heart failure: Secondary | ICD-10-CM | POA: Diagnosis not present

## 2016-07-04 DIAGNOSIS — K8689 Other specified diseases of pancreas: Secondary | ICD-10-CM | POA: Diagnosis not present

## 2016-07-04 DIAGNOSIS — Z7984 Long term (current) use of oral hypoglycemic drugs: Secondary | ICD-10-CM | POA: Diagnosis not present

## 2016-07-04 DIAGNOSIS — I4891 Unspecified atrial fibrillation: Secondary | ICD-10-CM

## 2016-07-04 DIAGNOSIS — E0840 Diabetes mellitus due to underlying condition with diabetic neuropathy, unspecified: Secondary | ICD-10-CM

## 2016-07-04 DIAGNOSIS — E1142 Type 2 diabetes mellitus with diabetic polyneuropathy: Secondary | ICD-10-CM | POA: Diagnosis not present

## 2016-07-04 DIAGNOSIS — Z86718 Personal history of other venous thrombosis and embolism: Secondary | ICD-10-CM

## 2016-07-04 DIAGNOSIS — Z86711 Personal history of pulmonary embolism: Secondary | ICD-10-CM | POA: Diagnosis not present

## 2016-07-04 DIAGNOSIS — E039 Hypothyroidism, unspecified: Secondary | ICD-10-CM | POA: Diagnosis not present

## 2016-07-04 DIAGNOSIS — C259 Malignant neoplasm of pancreas, unspecified: Secondary | ICD-10-CM

## 2016-07-04 LAB — POCT INR: INR: 2.8

## 2016-07-04 MED ORDER — TRAMADOL HCL 50 MG PO TABS: 50.0000 mg | ORAL_TABLET | Freq: Four times a day (QID) | ORAL | 1 refills | Status: AC | PRN

## 2016-07-04 NOTE — Telephone Encounter (Signed)
Spoke with [employer], new pt referral @ [place] to refer pt for hospice as per md's instructions.   Informed Amber that Dr. Burr Medico will be the attending for hospice.  Asked Amber to activate hospice standing orders as per protocol.   Amber voiced understanding.

## 2016-07-04 NOTE — Patient Instructions (Signed)
Pre visit review using our clinic review tool, if applicable. No additional management support is needed unless otherwise documented below in the visit note. 

## 2016-07-04 NOTE — Progress Notes (Signed)
[place]  Telephone:(336) [phone] Fax:(336) [phone]  Clinic Follow up Note   Patient Care Team: Midge Minium, MD as PCP - General Josue Hector, MD as Consulting Physician (Cardiology) 07/04/2016  CHIEF COMPLAIN: f/u after hospital discharge   SUMMARY OF ONCOLOGIC HISTORY: Oncology History   Presented w/right abdominal pain, anorexia, altered taste, fatigue Pancreatic adenocarcinoma (HCC)   Staging form: Pancreas, AJCC 7th Edition   - Clinical stage from 06/28/2016: Stage IV (T2, N1, M1) - Signed by Truitt Merle, MD on 07/04/2016      Pancreatic adenocarcinoma ([place])   06/11/2016 Imaging    CT ABD/PELVIS:Pancreas tail mass  6.2 x 7.0 x 6.0 cm; invades splenic hilum, surrounding splenic artery; 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 Poorly defined borders. Does not invade celiac trunk, SMA, SMV or portal vein. Involves splenic vessels (Dr. Ardis Hughs)      06/28/2016 Pathology Results    Adenocarcinoma of pancreas      History of present illness (06/11/2016):   75 year old occasional female with past medical history of hypertension, atrial fibrillation, type 2 diabetes, history of DVT, anxiety, who presented with right lower abdominal pain, nausea, and fever, was admitted to hospital for diverticulitis. CT of abdomen and pelvis revealed a large mass in the pancreatic tail, with direct imaging to spleen and left adrenal gland, and abutting the stomach. I was consulted for presumed pancreatic cancer.  She lives with her husband. She had knee surgery a few years ago, and has had significant knee pain since the surgery, walks with a walker, not physically active. She also reports fatigue, anorexia and intermittent nausea for the past month. She has chronic low back  pain, which has not changed. She denies any significant epigastric pain, melena, or diarrhea.   INTERVAL HISTORY: Mrs. [place] for follow-up. She is accompanied by her husband and her sister to my clinic today. I initially saw her in the hospital, she underwent pancreatic mass biopsy last week, and she is here to discuss the biopsy results. She came in a wheelchair, with nasal cannula oxygen, appears to be very weak and anxious. She complains of abdominal pain, for which been taking half a tablet of Vicodin as needed. She has been premuch bedbound, complains of fatigue and does not want to do anything. She has debilitating anxiety, takes Xanax as needed. No significant nausea, appetite is moderate, no other complaints.  REVIEW OF SYSTEMS:   Constitutional: Denies fevers, chills or abnormal weight loss Eyes: Denies blurriness of vision Ears, nose, mouth, throat, and face: Denies mucositis or sore throat Respiratory: Denies cough, dyspnea or wheezes Cardiovascular: Denies palpitation, chest discomfort or lower extremity swelling Gastrointestinal:  Denies nausea, heartburn or change in bowel habits, (+) abdominal pain Skin: Denies abnormal skin rashes Lymphatics: Denies new lymphadenopathy or easy bruising Neurological:Denies numbness, tingling or new weaknesses Behavioral/Psych: She has debilitating anxiety All other systems were reviewed with the patient and are negative.  MEDICAL HISTORY:  Past Medical History:  Diagnosis Date  . Anemia   . Anxiety    SEVERE  . Arthritis    "probably in my back" (05/08/2016)  . Atrial fibrillation with RVR ([place]) 11/28/2012  . Chronic lower back pain   . Coronary artery  disease   . Diverticulitis   . DVT (deep venous thrombosis) ([place])   . Emphysema of lung ([place])   . GERD (gastroesophageal reflux disease)   . High cholesterol   . Hyperglycemia   . Hyperlipidemia   . Hypertension   . Impaired mobility    ambulates with walker.  . Leukocytosis    . Multiple drug allergies   . Neuropathy ([place])   . On home oxygen therapy    "2L; 24/7 prn" (05/08/2016)  . PE (pulmonary embolism)    Bilateral PE, stringy saddle embolus with predominant proximal lower lobe pulmonary artery involvement 06/26/12  . Type II diabetes mellitus ([place])     SURGICAL HISTORY: Past Surgical History:  Procedure Laterality Date  . CARDIAC CATHETERIZATION N/A 05/30/2016   Procedure: Left Heart Cath and Coronary Angiography;  Surgeon: Peter M Martinique, MD;  Location: [facility];  Service: Cardiovascular;  Laterality: N/A;  . CATARACT EXTRACTION W/ INTRAOCULAR LENS  IMPLANT, BILATERAL Bilateral 08/2013  . EUS N/A 06/28/2016   Procedure: UPPER ENDOSCOPIC ULTRASOUND (EUS) RADIAL;  Surgeon: [name], MD;  Location: [facility];  Service: Endoscopy;  Laterality: N/A;  . JOINT REPLACEMENT    . TONSILLECTOMY    . TOTAL KNEE ARTHROPLASTY Left 11/24/2012   Procedure: TOTAL KNEE ARTHROPLASTY;  Surgeon: Kerin Salen, MD;  Location: [facility];  Service: Orthopedics;  Laterality: Left;  . TYMPANOSTOMY TUBE PLACEMENT Right early 2000s    I have reviewed the social history and family history with the patient and they are unchanged from previous note.  ALLERGIES:  is allergic to morphine and related; cymbalta [duloxetine hcl]; amoxicillin; amoxicillin-pot clavulanate; citalopram hydrobromide; clonazepam; macrobid [nitrofurantoin macrocrystal]; penicillins; pyridium [phenazopyridine hcl]; sertraline hcl; wellbutrin [bupropion]; and zonegran.  MEDICATIONS:  Current Outpatient Prescriptions  Medication Sig Dispense Refill  . acetaminophen (TYLENOL) 325 MG tablet Take 325-650 mg by mouth every 6 (six) hours as needed (for headache).     Marland Kitchen albuterol (PROVENTIL HFA;VENTOLIN HFA) 108 (90 Base) MCG/ACT inhaler Inhale 2 puffs into the lungs every 6 (six) hours as needed for wheezing or shortness of breath. 1 Inhaler 2  . alprazolam (XANAX) 2 MG tablet Take 1 tablet (2 mg  total) by mouth 3 (three) times daily. (Patient taking differently: Take 1 mg by mouth 6 (six) times daily. ) 20 tablet 0  . Cholecalciferol (VITAMIN D) 2000 UNITS tablet Take 2,000 Units by mouth daily.    . clobetasol cream (TEMOVATE) [id number] % Apply 1 application topically 2 (two) times daily as needed (for yeast infection).    Marland Kitchen esomeprazole (NEXIUM) 40 MG capsule Take 1 capsule (40 mg total) by mouth daily as needed. For acid reflux. (Patient taking differently: Take 40 mg by mouth daily at 12 noon. For acid reflux.) 90 capsule 1  . fenofibrate 160 MG tablet Take 1 tablet (160 mg total) by mouth daily. 90 tablet 1  . Fluticasone Furoate-Vilanterol (BREO ELLIPTA IN) Inhale 1 puff into the lungs daily.    . folic acid (FOLVITE) 1 MG tablet Take 1 tablet (1 mg total) by mouth daily. 90 tablet 1  . furosemide (LASIX) 40 MG tablet TAKE 1 TABLET BY MOUTH EVERY DAY 90 tablet 1  . HYDROcodone-acetaminophen (NORCO/VICODIN) 5-325 MG tablet Take 1-2 tablets by mouth every 4 (four) hours as needed. 30 tablet 0  . Lacosamide 100 MG TABS Take 1 tablet (100 mg total) by mouth 2 (two) times daily. 60 tablet 1  . levETIRAcetam (KEPPRA) 750 MG tablet  Take 2 tablets (1,500 mg total) by mouth 2 (two) times daily. 120 tablet 1  . levothyroxine (SYNTHROID, LEVOTHROID) 50 MCG tablet TAKE 1 TABLET (50 MCG TOTAL) BY MOUTH DAILY. 90 tablet 1  . lisinopril (PRINIVIL,ZESTRIL) 10 MG tablet TAKE 1 TABLET BY MOUTH EVERY DAY (Patient taking differently: Take 10 mg by mouth every day) 90 tablet 1  . metFORMIN (GLUCOPHAGE-XR) 500 MG 24 hr tablet Take 500-1,000 mg by mouth See admin instructions. Pt takes 1000mg  in morning after breakfast, 500mg  in evening after dinner    . metoprolol succinate (TOPROL XL) 25 MG 24 hr tablet Take 1 tablet (25 mg total) by mouth daily. 90 tablet 3  . Multiple Vitamin (MULTIVITAMIN) tablet Take 1 tablet by mouth daily.      . OXYGEN Inhale 2 L into the lungs daily as needed (during activity and while  sleeping).     . sertraline (ZOLOFT) 25 MG tablet Take 1 tablet (25 mg total) by mouth at bedtime. 30 tablet 3  . simvastatin (ZOCOR) 20 MG tablet TAKE 1 TABLET BY MOUTH EVERY EVENING (Patient taking differently: Take 20 mg by mouth every evening) 90 tablet 1  . terconazole (TERAZOL 7) 0.4 % vaginal cream Place 1 applicator vaginally at bedtime as needed (itching).    . traMADol (ULTRAM) 50 MG tablet Take 1 tablet (50 mg total) by mouth every 6 (six) hours as needed. 30 tablet 1   No current facility-administered medications for this visit.     PHYSICAL EXAMINATION: ECOG PERFORMANCE STATUS: 3 - Symptomatic, >50% confined to bed  Vitals:   07/04/16 1330  BP: (!) 84/56  Pulse: (!) 102  Resp: 18  Temp: 97.7 F (36.5 C)   Filed Weights    GENERAL:alert, In distress, appears to be anxious, especially when we discussed her incurable cancer SKIN: skin color, texture, turgor are normal, no rashes or significant lesions EYES: normal, Conjunctiva are pink and non-injected, sclera clear OROPHARYNX:no exudate, no erythema and lips, buccal mucosa, and tongue normal  NECK: supple, thyroid normal size, non-tender, without nodularity LYMPH:  no palpable lymphadenopathy in the cervical, axillary or inguinal LUNGS: clear to auscultation and percussion with normal breathing effort HEART: regular rate & rhythm and no murmurs and no lower extremity edema ABDOMEN:abdomen soft, non-tender and normal bowel sounds Musculoskeletal:no cyanosis of digits and no clubbing  NEURO: alert & oriented x 3 with fluent speech, no focal motor/sensory deficits  LABORATORY DATA:  I have reviewed the data as listed CBC Latest Ref Rng & Units 06/26/2016 06/23/2016 06/22/2016  WBC 4.0 - 10.5 K/uL 6.7 9.0 7.4  Hemoglobin 12.0 - 15.0 g/dL 10.4(L) 9.5(L) 8.4(L)  Hematocrit 36.0 - 46.0 % 33.1(L) 30.0(L) 26.1(L)  Platelets 150 - 400 K/uL 357 302 294     CMP Latest Ref Rng & Units 06/26/2016 06/23/2016 06/22/2016    Glucose 65 - 99 mg/dL 104(H) 135(H) 132(H)  BUN 6 - 20 mg/dL 6 7 5(L)  Creatinine 0.44 - 1.00 mg/dL 0.69 0.66 0.64  Sodium 135 - 145 mmol/L 140 139 139  Potassium 3.5 - 5.1 mmol/L 4.6 3.7 3.4(L)  Chloride 101 - 111 mmol/L 104 109 110  CO2 22 - 32 mmol/L 27 25 25   Calcium 8.9 - 10.3 mg/dL 10.0 8.8(L) 8.0(L)  Total Protein 6.5 - 8.1 g/dL 5.8(L) - -  Total Bilirubin 0.3 - 1.2 mg/dL 0.3 - -  Alkaline Phos 38 - 126 U/L 38 - -  AST 15 - 41 U/L 18 - -  ALT  14 - 54 U/L 14 - -    Pathology report  Diagnosis 06/28/2016 FINE NEEDLE ASPIRATION, ENDOSCOPIC, PANCREAS TAIL (SPECIMEN 1 OF 1 COLLECTED 06/29/16): MALIGNANT CELLS CONSISTENT WITH ADENOCARCINOMA.  RADIOGRAPHIC STUDIES: I have personally reviewed the radiological images as listed and agreed with the findings in the report.  CT abdomen and pelvis w contrast 06/11/2018 IMPRESSION: 1. 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 Invasion of the stomach fundus not excluded, renal invasion doubtful. Pancreatic adenocarcinoma is the most likely possibility, lymphoma possible but less likely, GI stromal tumor less likely. 2. There are several nodular enhancing lesions along the linea alba/umbilical region along with faint nodularity along the left paracolic gutter, cannot exclude metastatic lesions. I do not see any metastatic lesions to the liver. 3. 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 There is also fairly prominent sigmoid colon diverticulitis with a small amount of posterior extraluminal contained gas, but no free-flowing intraperitoneal gas. Extensive inflammatory findings surround the involved portion of the sigmoid colon with secondary inflammation of surrounding structures. 4. Trace  bilateral pleural effusions.  Moderate cardiomegaly. 5. Low-density blood pool suggests anemia. 6. Mildly distended gallbladder. Small gallstones, as shown on recent ultrasound.   ASSESSMENT & PLAN:  75 year old Caucasian female, with past medical history of hypertension, diabetes, atrial fibrillation, DVT, on Coumadin, presented with abdominal pain and fever.  1. 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 The skin was reviewed. She has very locally advanced pancreatic adenocarcinoma, and probable peritoneal metastasis -Her tumor marker CA A 19.9 is more than 10,000, consistent with metastatic disease -We discussed that her cancer is incurable at this stage. We discussed overall very poor prognosis, especially given her multiple comorbidities, debilitating anxiety, and a very poor performance status. Her left weeks patency is likely going to be a few weeks to a few months. -She is not a candidate for systemic chemotherapy due to her poor performance status and the medical comorbidities. Patient states she does not want to come to our cancer center anyway  -I recommend hospice for more comprehensive home care, giving her terminal malignancy. I introduced what hospice can offer, after lengthy discussion, both patient and her family members agreed with hospice. I'll make a referral today -I recommend DO NOT RESUSCITATE and DO NOT INTUBATE, she agrees. I encourage patient's family to call hospice if she has any emergency, instead of coming to emergency room or hospital.  2. Hypotension -She is on multiple medications for blood pressure, I recommend her to stop for now and check her blood pressure at home  3. Type 2 DM 4. AF 5. History of DVT, on coumadin   Plan -Hospice referral today. I'll be happy to remain to  be her M.D. when she is under hospice care.  All questions were answered. The patient knows to call the clinic with any problems, questions or concerns. No barriers to learning was detected. I spent 30 minutes counseling the patient face to face. The total time spent in the appointment was 40 minutes and more than 50% was on counseling and review of test results     Truitt Merle, MD 07/04/16

## 2016-07-04 NOTE — Progress Notes (Signed)
I agree with this plan.

## 2016-07-18 ENCOUNTER — Ambulatory Visit: Payer: Medicare Other | Admitting: Pulmonary Disease

## 2016-07-21 DIAGNOSIS — J449 Chronic obstructive pulmonary disease, unspecified: Secondary | ICD-10-CM | POA: Diagnosis not present

## 2016-07-24 DIAGNOSIS — J449 Chronic obstructive pulmonary disease, unspecified: Secondary | ICD-10-CM | POA: Diagnosis not present

## 2016-07-30 ENCOUNTER — Ambulatory Visit: Payer: Self-pay | Admitting: General Practice

## 2016-07-31 ENCOUNTER — Telehealth: Payer: Self-pay | Admitting: *Deleted

## 2016-07-31 NOTE — Telephone Encounter (Signed)
Received vm call from [facility] RN/Megan reporting pt having symptoms of hypoglycemia today & is on metformin 1500 mg/day.  Returned call & pt reports not feeling very well, light headed, trembling, & ate poor yesterday.  Methadone 2.5 mg tid was added at week or two ago & pt c/o of increased pain & dose was increased to 5 mg tid & pt had two doses of increase yest.  Hospice RN informed pt to check her blood glucose more often & to try to eat regularly.  Jinny Blossom will get back with [place] tomorrow with glucose results to see if any change needs to be made with metformin.

## 2016-08-01 ENCOUNTER — Telehealth: Payer: Self-pay | Admitting: *Deleted

## 2016-08-01 NOTE — Telephone Encounter (Signed)
I will defer this to pt's PCP or endocrinologist whoever prescript metformin for pt.  Thanks   Truitt Merle MD

## 2016-08-01 NOTE — Telephone Encounter (Signed)
Megan RN with [facility] called and left message.  Pt did not take her metformin last night and her blood sugar was 114 last night.  Her blood sugar this am before breakfast was 120.  She then took her metformin.  Family is wondering how to proceed with the metformin.  Please call Megan at [phone]. (Also see note from yesterday regarding this.).

## 2016-08-01 NOTE — Telephone Encounter (Addendum)
Called and left message with Jinny Blossom at [phone] that Dr. Burr Medico will defer this to patient's PCP or endocrinologist - whoever prescribed the metformin for pt.  Please call us if she has further questions regarding this.

## 2016-08-10 ENCOUNTER — Ambulatory Visit: Payer: Medicare Other | Admitting: Cardiology

## 2016-08-13 ENCOUNTER — Ambulatory Visit: Payer: Medicare Other | Admitting: Adult Health

## 2016-08-20 DIAGNOSIS — J449 Chronic obstructive pulmonary disease, unspecified: Secondary | ICD-10-CM | POA: Diagnosis not present

## 2016-08-23 ENCOUNTER — Telehealth: Payer: Self-pay | Admitting: Medical Oncology

## 2016-08-23 DIAGNOSIS — J449 Chronic obstructive pulmonary disease, unspecified: Secondary | ICD-10-CM | POA: Diagnosis not present

## 2016-08-23 NOTE — Telephone Encounter (Signed)
Requests refill for Methadone. I called Bonnie at hospice because  methadone not on pt med list. She stated Dr Jenene Slicker wrote rx yesterday and rx faxed locally. No further action needed.

## 2016-09-07 ENCOUNTER — Telehealth: Payer: Self-pay | Admitting: Family Medicine

## 2016-09-07 NOTE — Telephone Encounter (Signed)
Patient notified of PCP recommendations and is agreement and expresses an understanding.  

## 2016-09-07 NOTE — Telephone Encounter (Signed)
Pt states that she would like a call back regarding her Hospice care, Pt would like it to be stopped. Also pt states that Hospice has stopped her diabetes meds. Pt states that her cancer Dr is the one that started Hospice care, I advise pt that she might should call them regarding this matter since they are the ones that started the referral and pt states that she wanted [facility] to call her back.

## 2016-09-07 NOTE — Telephone Encounter (Signed)
I cannot stop the Hospice that I did not order.  And I agree that Hospice is the best course of action at this time given the diagnosis.  If she has any questions, she really needs to talk w/ oncology

## 2016-09-20 DIAGNOSIS — J449 Chronic obstructive pulmonary disease, unspecified: Secondary | ICD-10-CM | POA: Diagnosis not present

## 2016-10-02 ENCOUNTER — Other Ambulatory Visit: Payer: Self-pay | Admitting: Family Medicine

## 2016-10-03 ENCOUNTER — Encounter: Payer: Self-pay | Admitting: General Practice

## 2016-10-03 ENCOUNTER — Encounter: Payer: Self-pay | Admitting: Family Medicine

## 2016-10-03 ENCOUNTER — Other Ambulatory Visit: Payer: Self-pay | Admitting: Family Medicine

## 2016-10-03 NOTE — Telephone Encounter (Signed)
I thought Hospice was providing pt's medications

## 2016-10-03 NOTE — Telephone Encounter (Signed)
mychart sent to pt to verify

## 2016-10-04 MED ORDER — FENOFIBRATE 160 MG PO TABS: 160.0000 mg | ORAL_TABLET | Freq: Every day | ORAL | 1 refills | Status: AC

## 2016-10-10 ENCOUNTER — Telehealth: Payer: Self-pay | Admitting: *Deleted

## 2016-10-10 NOTE — Telephone Encounter (Signed)
Patient's husband called and asked if a RX could be called in for Lidocaine 5% patch.    Husband states that patient is on Hospice, and they do not supply this because it has to be authorized with insurance. Patient originally got the RX from ortho, but he states they are no longer filling it because she is not seeing them so he is wanting Dr. Birdie Riddle to take over this.   Patient aware that message is being routed to provider team

## 2016-10-11 ENCOUNTER — Encounter: Payer: Self-pay | Admitting: General Practice

## 2016-10-11 MED ORDER — LIDOCAINE 5 % EX PTCH: 1.0000 | MEDICATED_PATCH | CUTANEOUS | 6 refills | Status: AC

## 2016-10-11 NOTE — Addendum Note (Signed)
Addended by: Davis Gourd on: 10/11/2016 04:54 PM   Modules accepted: Orders

## 2016-10-11 NOTE — Telephone Encounter (Signed)
Ok for Lidocaine patch 5%, 1 box, 6 refills

## 2016-10-11 NOTE — Telephone Encounter (Signed)
Medication filled to pharmacy as requested.   

## 2016-11-21 ENCOUNTER — Telehealth: Payer: Self-pay | Admitting: *Deleted

## 2016-11-21 NOTE — Telephone Encounter (Signed)
Bonnie @ [facility] called to notify Dr. Burr Medico re:  Pt expired   Dec 11, 2016  At  712 pm at home. Bonnie's   Phone    [phone]

## 2016-11-22 ENCOUNTER — Telehealth: Payer: Self-pay | Admitting: Family Medicine

## 2016-11-22 NOTE — Telephone Encounter (Signed)
Patient's husband calling to inform pcp that patient passed away on 12/12/22.  The funeral will be tomorrow. He sincerely appreciates all Dr. Birdie Riddle has done for patient while she was under her care.

## 2016-11-23 ENCOUNTER — Other Ambulatory Visit: Payer: Self-pay | Admitting: Family Medicine

## 2016-11-23 NOTE — Telephone Encounter (Signed)
Sympathy card given to PCP. Can we have pt status changed in the system?

## 2016-11-23 NOTE — Telephone Encounter (Signed)
Please get sympathy card for Sarah Mcguire

## 2016-12-02 DEATH — deceased

## 2016-12-04 ENCOUNTER — Telehealth: Payer: Self-pay | Admitting: *Deleted

## 2016-12-04 NOTE — Telephone Encounter (Signed)
"  This is [facility] [phone]) calling to check on the status of the Death Certificate for this patient.  It was mailed to [facility] on December 16, 2016.  The Health Department is to have these within five days of death per [facility].."  Medical records does not have certificate.  Message left with collaborative with this request.  Funeral awaiting return call.  Answered question of where Medical Records is located.  No further questions.

## 2017-10-01 IMAGING — MR MR HEAD W/O CM
7 of 9 series · 36 of 48 positions shown · non-contrast
Comparison: Prior CT from 06/19/2016.

CLINICAL DATA: Initial evaluation for acute altered mental status.

EXAM:
MRI HEAD WITHOUT CONTRAST
TECHNIQUE: Multiplanar, multiecho pulse sequences of the brain and surrounding
structures were obtained without intravenous contrast.

[Series 3: DWI · axial · 3.0mm · 1.09mm/px · z∈[-60,+80]mm · 8 of 98 slices shown (1 of 4)]
[im 1/98]
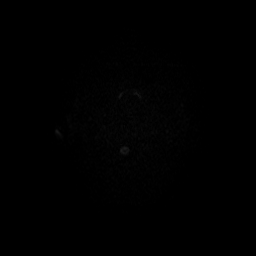
[im 15/98]
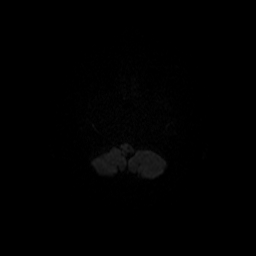
[im 30/98]
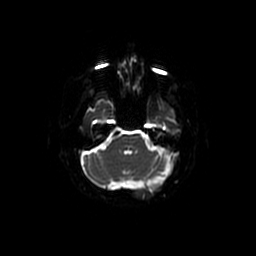
[im 45/98]
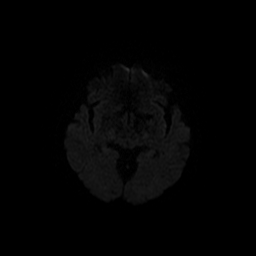
[im 53/98]
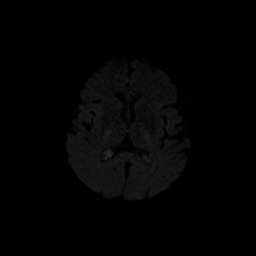
[im 68/98]
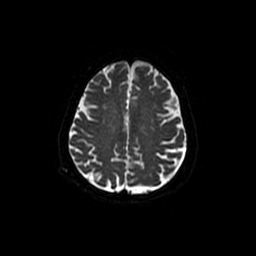
[im 83/98]
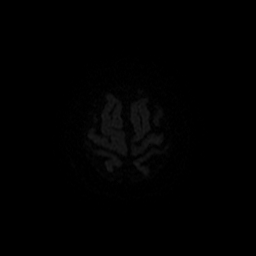
[im 98/98]
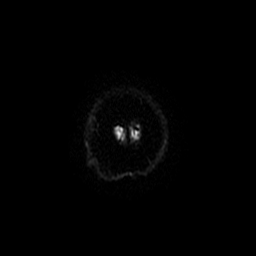

[Series 5: T2 · axial · 5.0mm · 0.43mm/px · z∈[-57,+84]mm · 3 of 25 slices shown (1 of 2)]
[im 1/25]
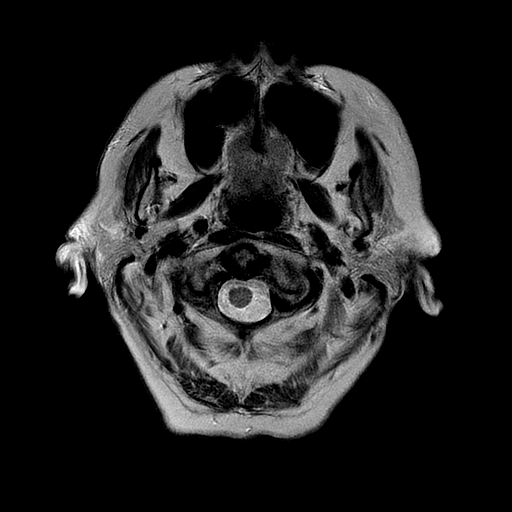
[im 13/25]
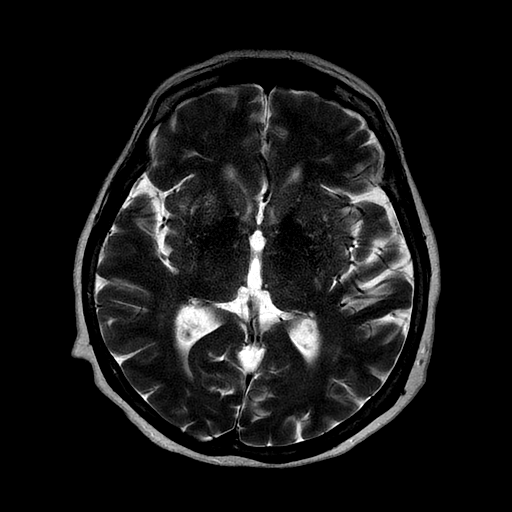
[im 25/25]
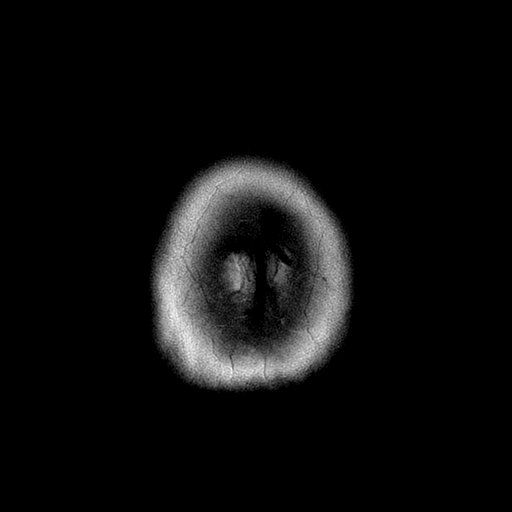

[Series 6: FLAIR · axial · 5.0mm · 0.43mm/px · z∈[-57,+84]mm · 3 of 25 slices shown]
[im 1/25]
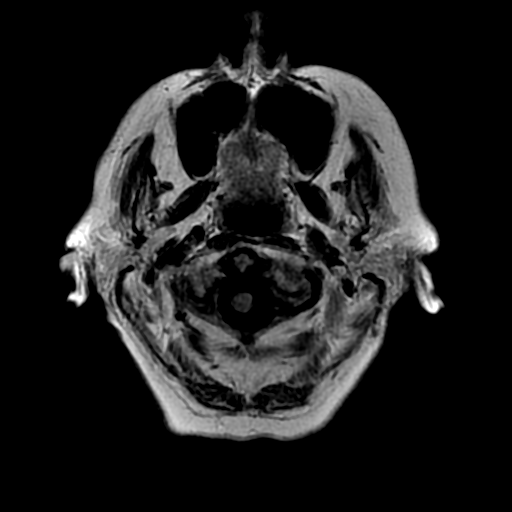
[im 13/25]
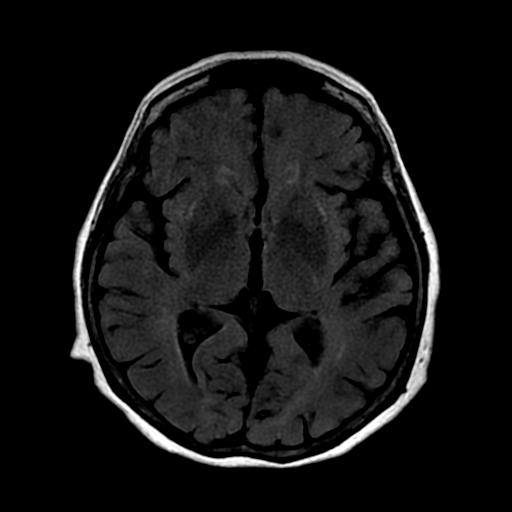
[im 25/25]
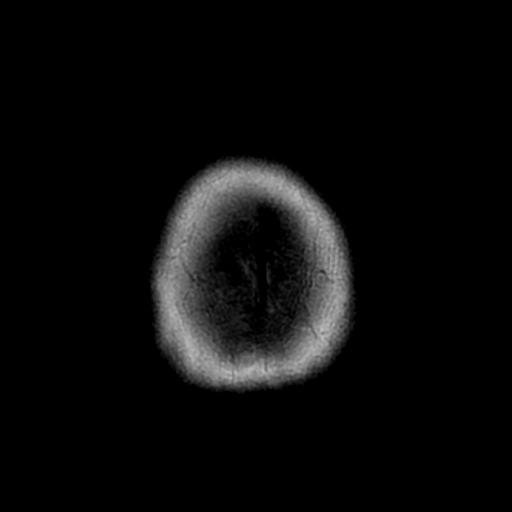

[Series 7: DWI · coronal · 5.0mm · 1.09mm/px · 8 of 64 slices shown (2 of 4)]
[im 1/64]
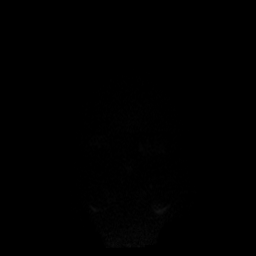
[im 10/64]
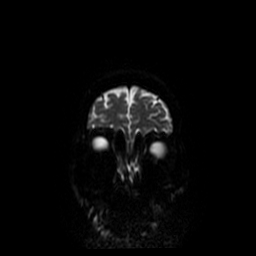
[im 19/64]
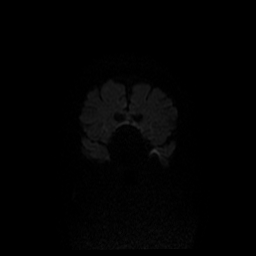
[im 28/64]
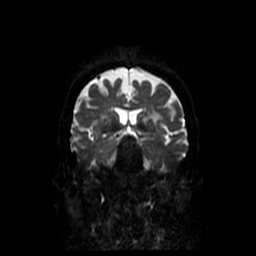
[im 37/64]
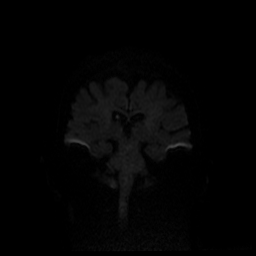
[im 46/64]
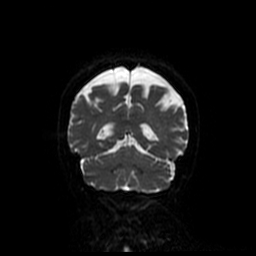
[im 55/64]
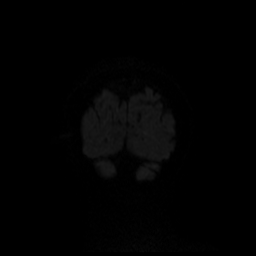
[im 64/64]
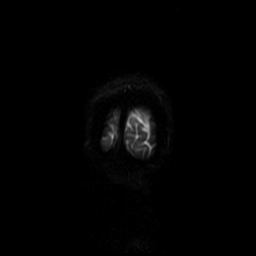

[Series 9: T2 · coronal · 5.0mm · 0.43mm/px · 4 of 28 slices shown (2 of 2)]
[im 1/28]
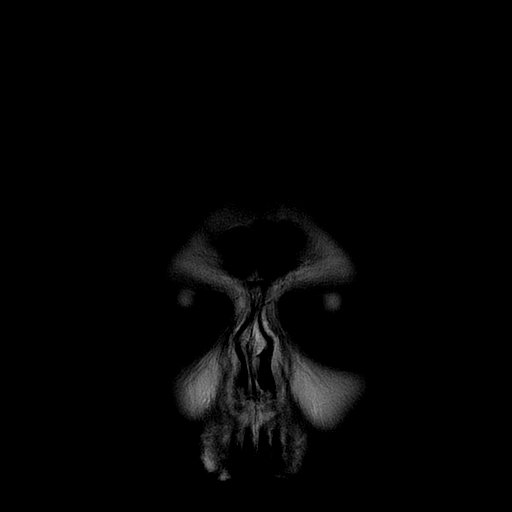
[im 10/28]
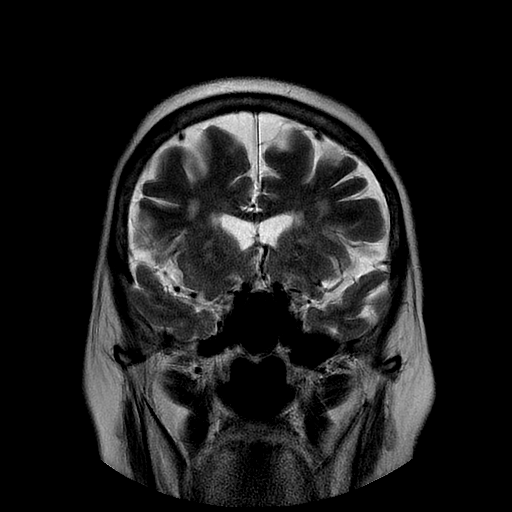
[im 19/28]
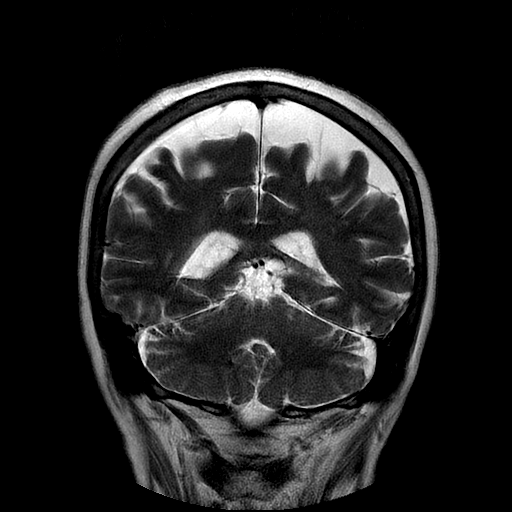
[im 28/28]
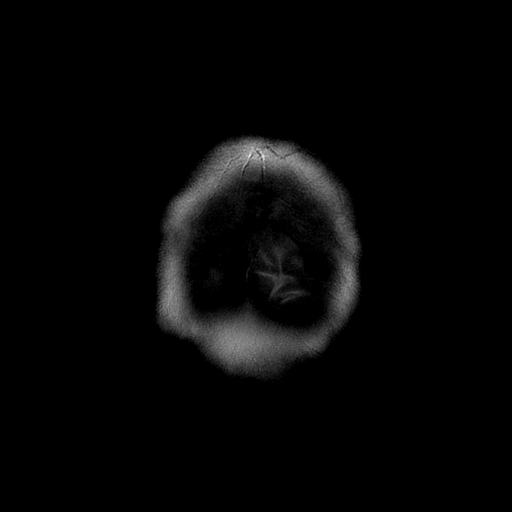

[Series 300: DWI · axial · 3.0mm · 1.09mm/px · z∈[-60,+80]mm · 6 of 49 slices shown (3 of 4)]
[im 1/49]
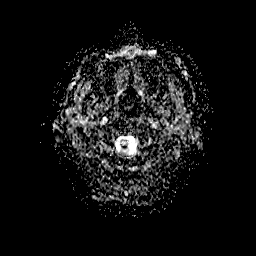
[im 10/49]
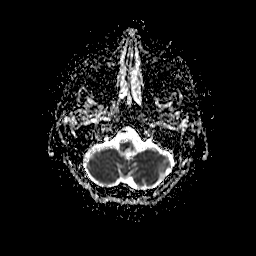
[im 20/49]
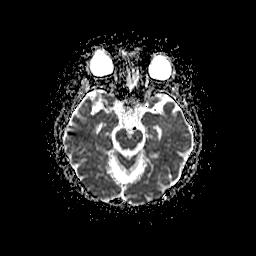
[im 29/49]
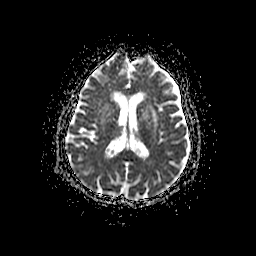
[im 39/49]
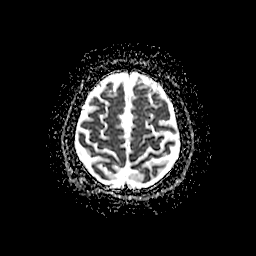
[im 49/49]
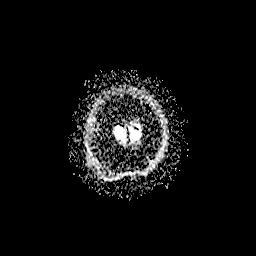

[Series 700: DWI · coronal · 5.0mm · 1.09mm/px · 4 of 32 slices shown (4 of 4)]
[im 1/32]
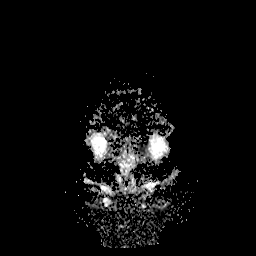
[im 11/32]
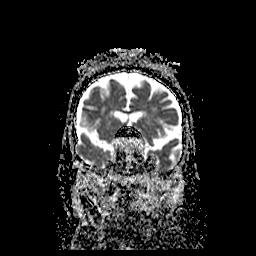
[im 21/32]
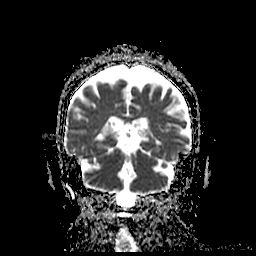
[im 32/32]
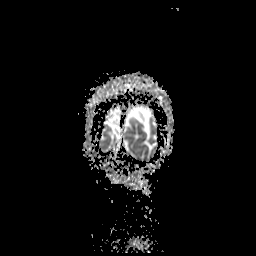

[36 of 48 positions shown; findings below may reference images not displayed]

FINDINGS: Brain: Study mildly limited by motion artifact in the patient's
inability to tolerate the full length of the exam. Axial T1 weighted
sequence was not performed.

Diffuse prominence of the CSF containing spaces is compatible with
generalized age-related cerebral atrophy. Patchy and confluent
T2/FLAIR hyperintensity within the periventricular and deep white
matter both cerebral hemispheres most likely related to chronic
microvascular ischemic disease, mild in nature. Probable small
remote lacunar infarcts noted within the bilateral basal ganglia.
Small remote bilateral cerebellar infarcts noted as well.

No abnormal foci of restricted diffusion to suggest acute or
subacute ischemia. Gray-white matter differentiation maintained. No
acute or chronic intracranial hemorrhage.

No mass lesion, midline shift, or mass effect. No hydrocephalus. No
extra-axial fluid collection. Major dural sinuses are grossly
patent.

Pituitary gland within normal limits.

Vascular: Major intracranial vascular flow voids are preserved.
Right vertebral artery hypoplastic.

Skull and upper cervical spine: Craniocervical junction within
normal limits. Visualized upper cervical spine unremarkable. Bone
marrow signal intensity within normal limits. No scalp soft tissue
abnormality.

Sinuses/Orbits: Globes and orbital soft tissues grossly normal.
Patient is status post lens extraction bilaterally. Mild scattered
mucosal thickening within the ethmoidal air cells. Paranasal sinuses
are otherwise clear. No mastoid effusion. Inner ear structures
grossly normal.
IMPRESSION: 1. No acute intracranial process identified.
2. Small remote lacunar infarcts within the bilateral basal ganglia,
with additional small remote bilateral cerebellar infarcts.
3. Generalized cerebral atrophy with mild chronic microvascular
ischemic disease.

## 2018-12-29 ENCOUNTER — Other Ambulatory Visit: Payer: Self-pay

## 2018-12-29 NOTE — Patient Outreach (Signed)
[facility] chart entry:  Per Tiajuana Amass case needs closing. Previous patient of Raina Mina.  Program end date and type entered into chart.  Tomasa Rand, RN, BSN, CEN [facility] [employer] [phone]

## 2019-01-21 ENCOUNTER — Other Ambulatory Visit: Payer: Self-pay | Admitting: *Deleted
